# Patient Record
Sex: Male | Born: 1955 | Race: Asian | Hispanic: No | Marital: Married | Smoking: Never smoker
Health system: Southern US, Community
[De-identification: ages and names within clinical notes are randomized; demographics above are authoritative.]

## PROBLEM LIST (undated history)

## (undated) DIAGNOSIS — I10 Essential (primary) hypertension: Secondary | ICD-10-CM

## (undated) DIAGNOSIS — M109 Gout, unspecified: Secondary | ICD-10-CM

## (undated) HISTORY — DX: Essential (primary) hypertension: I10

## (undated) HISTORY — PX: PROSTATE BIOPSY: SHX241

## (undated) HISTORY — DX: Gout, unspecified: M10.9

## (undated) NOTE — *Deleted (*Deleted)
Rapid Response Event Note   Reason for Call :  MEWs  Initial Focused Assessment:  Called by the charge nurse to come and assess this patient.  His MEWs was elevated because his had elevated RR and was tachycardic.  Patient has developed sepsis during this hospital admission.  Patient was able to answer my questions with the help of an interpretor service.  He denied SOB and chest pain.  He was complaining of pain in RUQ.  Patient did not appear to be in any acute distress   BP 128/75 ECG 114 RR 35 O2 94     Interventions:  Vitals taken and spoke to the patient via interpreter  Plan of Care:  Patient will remain on the unit   Event Summary:   MD Notified: Hospitalist Call Time:  0803 Arrival Time: 0805 End Time:  Andrey Spearman, RN

## (undated) NOTE — *Deleted (*Deleted)
PMR Admission Coordinator Pre-Admission Assessment  Patient: Mark May is an 35 y.o., male MRN: [id number] DOB: [birth year] Height:   Weight:                Insurance Information HMO:     PPO: yes     PCP:      IPA:      80/20:      OTHER:  PRIMARY: [facility]      Policy#: [id number]      Subscriber: patient CM Name: ***      Phone#: ***     Fax#: *** Pre-Cert#: ***      Employer:  Benefits:  Phone #: [phone]     Name:  Eff. Date: 01/30/2020-10/30/2020     Deduct: $1,500 ($0 met)      Out of Pocket Max: $2,850 ($15 met)      Life Max: NA  CIR: 70% coverage, 30% co-insurance, after ded if met      SNF: 70% coverage, 30% co-insurance; limited to 60 days per cal yr, after ded is met Outpatient: 70% coverage, 30% co-insurance, 30 per calendar year combined therapies, after ded is met     Home Health: 70% coverage, 20% co-insurance, after ded is met   DME: 70% coverage, 30% co-insurance, after ded is met      Providers:  SECONDARY: None      Policy#:       Phone#:   Artist:       Phone#:   The Engineer, materials Information Summary" for patients in Inpatient Rehabilitation Facilities with attached "Privacy Act Statement-Health Care Records" was provided and verbally reviewed with: {CHL IP Patient Family EX:[id number]}  Emergency Contact Information Contact Information    Name Relation Home Work Mobile   Mark May,Jin Daughter [phone]  [phone]   Kamin, Niblack [phone]  [phone]     Current Medical History  Patient Admitting Diagnosis: 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  Per chart review lives with spouse and children 1 level home 7 steps to entry.  Reportedly independent prior to admission.  Presented 10/09/2020 with left-sided  weakness, dysarthria and facial droop.  Cranial CT scan negative for acute changes.  CT angiogram of head and neck severe distal right M1 stenosis versus subocclusive thrombus.  Right MCA infarct with extensive penumbra.  4 mm right supraclinoid ICA stenosis.  Admission chemistries unremarkable aside glucose 110 hemoglobin 16.5 hemoglobin A1c 5.8.  Patient underwent thrombectomy revascularization with stent placement per interventional radiology.  MRI/MRI showed fairly extensive patchy acute early subacute infarcts within the right MCA watershed territory.  Additional small acute early subacute infarcts within the dorsal right thalamus and left basal ganglia.  Echocardiogram with ejection fraction of 70 to 75% grade 2 diastolic dysfunction.  Venous Dopplers left upper extremity negative for DVT.  Maintained on aspirin as well as Brilinta for CVA prophylaxis.  Subcutaneous Lovenox for DVT prophylaxis.  Maintain on a dysphagia #3 nectar thick liquid diet.  Therapy evaluations completed and patient is to be admitted for a comprehensive rehab program on ***.  Complete NIHSS TOTAL: 16 Glasgow Coma Scale Score: 14  Past Medical History  Past Medical History:  Diagnosis Date  . Gout   . Hypertension     Family History  family history includes Diabetes in his father and  mother.  Prior Rehab/Hospitalizations:  Has the patient had prior rehab or hospitalizations prior to admission? Yes  Has the patient had major surgery during 100 days prior to admission? Yes  Current Medications   Current Facility-Administered Medications:  .   stroke: mapping our early stages of recovery book, , Does not apply, Once, Biby, Sharon L, NP .  0.9 %  sodium chloride infusion, , Intravenous, Continuous, Biby, Sharon L, NP, Last Rate: 75 mL/hr at 10/10/20 0153, New Bag at 10/10/20 0153 .  acetaminophen (TYLENOL) tablet 650 mg, 650 mg, Oral, Q4H PRN, 650 mg at 10/09/20 2351 **OR** acetaminophen (TYLENOL) 160 MG/5ML solution  650 mg, 650 mg, Per Tube, Q4H PRN, 650 mg at 10/08/20 0419 **OR** acetaminophen (TYLENOL) suppository 650 mg, 650 mg, Rectal, Q4H PRN, Layne Benton, NP, 650 mg at 10/07/20 2035 .  aspirin chewable tablet 81 mg, 81 mg, Oral, Daily, 81 mg at 10/10/20 0903 **OR** aspirin chewable tablet 81 mg, 81 mg, Per Tube, Daily, Biby, Sharon L, NP, 81 mg at 10/08/20 1042 .  atorvastatin (LIPITOR) tablet 20 mg, 20 mg, Oral, Daily, Biby, Sharon L, NP, 20 mg at 10/10/20 0903 .  chlorhexidine (PERIDEX) 0.12 % solution 15 mL, 15 mL, Mouth Rinse, BID, Biby, Sharon L, NP, 15 mL at 10/10/20 0903 .  Chlorhexidine Gluconate Cloth 2 % PADS 6 each, 6 each, Topical, Daily, Layne Benton, NP, 6 each at 10/10/20 0904 .  enoxaparin (LOVENOX) injection 40 mg, 40 mg, Subcutaneous, Q24H, Biby, Sharon L, NP, 40 mg at 10/09/20 1738 .  lisinopril (ZESTRIL) tablet 10 mg, 10 mg, Oral, Daily, Biby, Sharon L, NP, 10 mg at 10/10/20 0904 .  loperamide (IMODIUM) capsule 2 mg, 2 mg, Oral, TID, Micki Riley, MD, 2 mg at 10/10/20 0903 .  MEDLINE mouth rinse, 15 mL, Mouth Rinse, q12n4p, Biby, Sharon L, NP, 15 mL at 10/09/20 1640 .  melatonin tablet 3 mg, 3 mg, Oral, QHS, Erick Blinks, MD, 3 mg at 10/10/20 0119 .  [employer], , Oral, PRN, Layne Benton, NP .  senna-docusate (Senokot-S) tablet 1 tablet, 1 tablet, Oral, QHS PRN, Catha Gosselin, Sharon L, NP .  tamsulosin (FLOMAX) capsule 0.4 mg, 0.4 mg, Oral, QPC breakfast, Biby, Sharon L, NP, 0.4 mg at 10/10/20 0903 .  ticagrelor (BRILINTA) tablet 90 mg, 90 mg, Oral, BID, 90 mg at 10/10/20 0903 **OR** ticagrelor (BRILINTA) tablet 90 mg, 90 mg, Per Tube, BID, Biby, Sharon L, NP, 90 mg at 10/08/20 1042  Patients Current Diet:  Diet Order            DIET DYS 3 Room service appropriate? 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  Independent  Indoor Mobility: Did the patient need assistance with walking from room to room (with or without device)? Independent  Stairs: Did the patient need assistance with internal or external stairs (with or without device)? Independent  Functional  Cognition: Did the patient need help planning regular tasks such as shopping or remembering to take medications? Independent  Home Assistive Devices / Equipment Home Assistive Devices/Equipment: None Home Equipment: None  Prior Device Use: Indicate devices/aids used by the patient prior to current illness, exacerbation or injury? None of the above  Current Functional Level Cognition  Overall Cognitive Status: Impaired/Different from baseline Current Attention Level: Alternating Orientation Level: Oriented X4 Following Commands: Follows one step commands inconsistently, Follows one step commands with increased time Safety/Judgement: Decreased awareness of safety, Decreased awareness of deficits General Comments: Pt minimally responsive this date and lethargic. Increased time and max cues provided to facilitate mobility, with decreased initiation noted.    Extremity Assessment (includes Sensation/Coordination)  Upper Extremity Assessment: LUE deficits/detail LUE Deficits / Details: flaccid LUE Sensation: decreased light touch LUE Coordination: decreased fine motor, decreased gross motor  Lower Extremity Assessment: LLE deficits/detail LLE Deficits / Details: quad activation, needs strong visual attention and tactile input of R hand to engage  on command LLE Coordination: decreased fine motor, decreased gross motor    ADLs  Overall ADL's : Needs assistance/impaired Eating/Feeding: Minimal assistance, Bed level Eating/Feeding Details (indicate cue type and reason): eating banana that was on L side of tray. pt requesting to eat instead of cook food on tray  Grooming: Oral care, Maximal assistance Grooming Details (indicate cue type and reason): pt noted to have dry mouth and lips--- provided oral care and lip moisturer this session Lower Body Dressing: Total assistance Toilet Transfer: +2 for physical assistance, Maximal assistance (L LE fully blocked) Toilet Transfer Details (indicate cue type and reason): simulated  Toileting - Clothing Manipulation Details (indicate cue type and reason): pt incontinence x2 during session and no awareness. RN made aware of signs of iriration due to voiding. pt with heavy barrier cream applied during session to help protect sign General ADL Comments: Translator placed in R visual field with good communicaiton engagement from patient. Pt needed cues to scan to the L for objects     Mobility  Overal bed mobility: Needs Assistance Bed Mobility: Supine to Sit Rolling: Max assist (roll L side minA, but maxA to roll to R) Supine to sit: +2 for physical assistance, Max assist Sit to supine: Mod assist, +2 for physical assistance General bed mobility comments: Cues provided to manage LEs off EOB, with decreased initiation noted with B LEs with min movement on R and no activation noted on L. MaxAx2 to manage trunk and LEs.    Transfers  Overall transfer level: Needs assistance Equipment used: 2 person hand held assist Transfers: Stand Pivot Transfers Sit to Stand: +2 physical assistance, Max assist Stand pivot transfers: +2 physical assistance, Max assist General transfer comment: L knee block and cues to come to stand and pivot towards R EOB > bedside chair, maxAx2 with min initiation noted to maintain  balance.     Ambulation / Gait / Stairs / Wheelchair Mobility  Ambulation/Gait General Gait Details: unable to attempt today    Posture / Balance Dynamic Sitting Balance Sitting balance - Comments: MaxA at trunk to maintain static sitting balance EOB. Balance Overall balance assessment: Needs assistance Sitting-balance support: Bilateral upper extremity supported, Feet supported Sitting balance-Leahy Scale: Zero Sitting balance - Comments: MaxA at trunk to maintain static sitting balance EOB. Standing balance support: Single extremity supported Standing balance-Leahy Scale: Zero Standing balance comment: maxA of 2 to maintain static stand with L knee block     Special needs/care consideration {Special Care  Needs/Care Considerations:[id number]}     Previous Home Environment (from acute therapy documentation) Living Arrangements: Spouse/significant other, Children Available Help at Discharge: Family, Available 24 hours/day Type of Home: House Home Layout: One level Home Access: Stairs to enter Entrance Stairs-Rails: Can reach both Entrance Stairs-Number of Steps: 7-8 Home Care Services: No Additional Comments: information via PT evaluation. pt mentiond wife during OT eval. RN staff mentioned a son to translate for them  Discharge Living Setting Plans for Discharge Living Setting: Patient's home, House, Lives with (comment) (wife, son and grandchildren) Type of Home at Discharge: House Discharge Home Layout: Two level, Able to live on main level with bedroom/bathroom Alternate Level Stairs-Rails: None Alternate Level Stairs-Number of Steps: NA Discharge Home Access: Stairs to enter Entrance Stairs-Rails: Can reach both Entrance Stairs-Number of Steps: 7-8  Discharge Bathroom Shower/Tub: Walk-in shower Discharge Bathroom Toilet: Standard Discharge Bathroom Accessibility: Yes How Accessible: Accessible via walker Does the patient have any problems obtaining your medications?:  No  Social/Family/Support Systems Patient Roles: Spouse (works around the house) Solicitor Information: son Bevelyn Buckles): [phone] (speaks english) Anticipated Caregiver: son + wife Anticipated Caregiver's Contact Information: see above for son's info Ability/Limitations of Caregiver: Min A Caregiver Availability: 24/7 Discharge Plan Discussed with Primary Caregiver: Yes (with son) Is Caregiver In Agreement with Plan?: Yes Does Caregiver/Family have Issues with Lodging/Transportation while Pt is in Rehab?: No   Goals Patient/Family Goal for Rehab: PT/OT/SLP: supervision/Min A Expected length of stay: 24-28 days Additional Information: understands some English-speaks Mandarin Pt/Family Agrees to Admission and willing to participate: Yes Program Orientation Provided & Reviewed with Pt/Caregiver Including Roles  & Responsibilities: Yes (pt and his son)  Barriers to Discharge: Home environment access/layout  Barriers to Discharge Comments: steps to enter home   Decrease burden of Care through IP rehab admission: OtherNA   Possible need for SNF placement upon discharge:Not anticipated; pt has good family support and they have an understanding of his expected goal level at DC.    Patient Condition: {PATIENT'S CONDITION:22832}  Preadmission Screen Completed By:  Cheri Rous, OT, 10/10/2020 12:13 PM ______________________________________________________________________   Discussed status with Dr. Marland Kitchenon***at *** and received approval for admission today.  Admission Coordinator:  Cheri Rous, time***/Date***

## (undated) NOTE — *Deleted (*Deleted)
Physical Medicine and Rehabilitation Admission H&P     HPI: Mark May is a 12 year old right-handed non-English-speaking Asian male with history of gout hypertension BPH sciatica partial gastrectomy for PUD.  Per chart review lives with spouse and children 1 level home 7 steps to entry.  Reportedly independent prior to admission.  Presented 10/08/2020 with left-sided weakness, dysarthria and facial droop.  Cranial CT scan negative for acute changes.  CT angiogram of head and neck severe distal right M1 stenosis versus subocclusive thrombus.  Right MCA infarct with extensive penumbra.  4 mm right supraclinoid ICA stenosis.  Admission chemistries unremarkable aside glucose 110 hemoglobin 16.5 hemoglobin A1c 5.8.  Patient underwent thrombectomy revascularization with stent placement per interventional radiology.  MRI/MRI showed fairly extensive patchy acute early subacute infarcts within the right MCA watershed territory.  Additional small acute early subacute infarcts within the dorsal right thalamus and left basal ganglia.  Echocardiogram with ejection fraction of 70 to 75% grade 2 diastolic dysfunction.  Venous Dopplers left upper extremity negative for DVT.  Maintained on aspirin as well as Brilinta for CVA prophylaxis.  Subcutaneous Lovenox for DVT prophylaxis.  Maintain on a dysphagia #3 nectar thick liquid diet.  Therapy evaluations completed and patient was admitted for a comprehensive rehab program.  Review of Systems  Unable to perform ROS: Language   Past Medical History:  Diagnosis Date  . Gout   . Hypertension    Past Surgical History:  Procedure Laterality Date  . PARTIAL GASTRECTOMY  1990   done in [place] for PUD  . PROSTATE BIOPSY  ~ 2005   negative for cancer (done in [place])  . RADIOLOGY WITH ANESTHESIA N/A 10/13/2020   Procedure: IR WITH ANESTHESIA;  Surgeon: Radiologist, Medication, MD;  Location: [facility] OR;  Service: Radiology;  Laterality: N/A;   Family History  Problem  Relation Age of Onset  . Diabetes Mother   . Diabetes Father    Social History:  reports that he has never smoked. He has never used smokeless tobacco. He reports that he does not drink alcohol and does not use drugs. Allergies: No Known Allergies Medications Prior to Admission  Medication Sig Dispense Refill  . allopurinol (ZYLOPRIM) 100 MG tablet Take 3 tablets (300 mg total) by mouth daily. (Patient not taking: Reported on 10/09/2020) 90 tablet 3  . amLODipine (NORVASC) 10 MG tablet Take 1 tablet (10 mg total) by mouth daily. (Patient not taking: Reported on 10/09/2020) 30 tablet 5  . tamsulosin (FLOMAX) 0.4 MG CAPS capsule Take 1 capsule (0.4 mg total) by mouth daily after supper. (Patient not taking: Reported on 10/09/2020) 30 capsule 5    Drug Regimen Review Drug regimen was reviewed and remains appropriate with no significant issues identified  Home: Home Living Family/patient expects to be discharged to:: Private residence Living Arrangements: Spouse/significant other, Children Available Help at Discharge: Family, Available 24 hours/day Type of Home: House Home Access: Stairs to enter [employer] of Steps: 7-8 Entrance Stairs-Rails: Can reach both Home Layout: One level Home Equipment: None Additional Comments: information via PT evaluation. pt mentiond wife during OT eval. 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 MaxAx2 to manage trunk and LEs. Transfers Overall transfer level: Needs assistance Equipment used: 2 person hand held assist Transfers: Stand Pivot Transfers Sit to Stand: +2 physical assistance, Max assist Stand pivot transfers: +2 physical assistance, Max assist General transfer comment: L knee block and cues to come to stand and pivot towards R EOB > bedside chair, maxAx2 with min initiation noted to maintain balance.  Ambulation/Gait General Gait Details: unable to attempt today    ADL: ADL Overall ADL's : Needs assistance/impaired Eating/Feeding: Minimal assistance, Bed level Eating/Feeding Details (indicate cue type and reason): eating banana that was on L side of tray. pt requesting to eat instead of cook food on tray  Grooming: Oral care, Maximal assistance Grooming Details (indicate cue type and reason): pt noted to have dry mouth and lips--- provided oral care and lip moisturer this session Lower Body Dressing: Total assistance Toilet Transfer: +2 for physical assistance, Maximal assistance (L LE fully blocked) Toilet Transfer Details (indicate cue type and reason): simulated  Toileting - Clothing Manipulation Details (indicate cue type and reason): pt incontinence x2 during session and no awareness. RN made aware of signs of iriration due to voiding. pt with heavy barrier cream applied during session to help protect sign General ADL Comments: Translator placed in R visual field with good communicaiton engagement from patient. Pt needed cues to scan to the L for objects   Cognition: Cognition Overall Cognitive Status: Impaired/Different from baseline Orientation Level: Oriented X4 Cognition Arousal/Alertness: Lethargic Behavior During Therapy: Flat affect Overall Cognitive Status: Impaired/Different from baseline Area of Impairment: Attention, Memory, Following commands, Safety/judgement, Awareness, Problem solving Current  Attention Level: Alternating Memory: Decreased recall of precautions, Decreased short-term memory Following Commands: Follows one step commands inconsistently, Follows one step commands with increased time Safety/Judgement: Decreased awareness of safety, Decreased awareness of deficits Awareness: Intellectual Problem Solving: Slow processing, Decreased initiation, Difficulty sequencing, Requires verbal cues, Requires tactile cues General Comments: Pt minimally responsive this date and lethargic. Increased time and max cues provided to facilitate mobility, with decreased initiation noted.  Physical Exam: Blood pressure 126/64, pulse (!) 115, temperature 98.8 F (37.1 C), temperature source Oral, resp. rate (!) 24, SpO2 95 %. Physical Exam Neurological:     Comments: Patient is alert.  Appears to have some left inattention.  Non-English-speaking.  Speaks primarily Mandarin.  He does follow simple demonstrated commands.  Patient will use some hand gestures to indicate he is thirsty.     Results for orders placed or performed during the hospital encounter of 10/13/2020 (from the past 48 hour(s))  Troponin I (High Sensitivity)     Status: None   Collection Time: 10/09/20  7:55 PM  Result Value Ref Range   Troponin I (High Sensitivity) 8 <18 ng/L    Comment: (NOTE) Elevated high sensitivity troponin I (hsTnI) values and significant  changes across serial measurements may suggest ACS but many other  chronic and acute conditions are known to elevate hsTnI results.  Refer to the "Links" section for chest pain algorithms and additional  guidance. Performed at [facility], [address], [place], [place] [address]   Troponin I (High Sensitivity)     Status: None   Collection Time: 10/09/20 10:02 PM  Result Value  Ref Range   Troponin I (High Sensitivity) 6 <18 ng/L    Comment: (NOTE) Elevated high sensitivity troponin I (hsTnI) values and significant  changes across serial measurements  may suggest ACS but many other  chronic and acute conditions are known to elevate hsTnI results.  Refer to the "Links" section for chest pain algorithms and additional  guidance. Performed at [facility], [address], [place], [place] [address]   CBC     Status: None   Collection Time: 10/10/20  2:59 AM  Result Value Ref Range   WBC 5.1 4.0 - 10.5 K/uL   RBC 4.62 4.22 - 5.81 MIL/uL   Hemoglobin 13.8 13.0 - 17.0 g/dL   HCT 19.1 39 - 52 %   MCV 88.7 80.0 - 100.0 fL   MCH 29.9 26.0 - 34.0 pg   MCHC 33.7 30.0 - 36.0 g/dL   RDW 47.8 29.5 - 62.1 %   Platelets 203 150 - 400 K/uL   nRBC 0.0 0.0 - 0.2 %    Comment: Performed at [facility], [address] [place], [place] [address]  Basic metabolic panel     Status: Abnormal   Collection Time: 10/10/20  2:59 AM  Result Value Ref Range   Sodium 137 135 - 145 mmol/L   Potassium 3.4 (L) 3.5 - 5.1 mmol/L   Chloride 105 98 - 111 mmol/L   CO2 23 22 - 32 mmol/L   Glucose, Bld 165 (H) 70 - 99 mg/dL    Comment: Glucose reference range applies only to samples taken after fasting for at least 8 hours.   BUN 36 (H) 8 - 23 mg/dL   Creatinine, Ser 7.84 (H) 0.61 - 1.24 mg/dL   Calcium 8.1 (L) 8.9 - 10.3 mg/dL   GFR, Estimated 44 (L) >60 mL/min    Comment: (NOTE) Calculated using the CKD-EPI Creatinine Equation (2021)    Anion gap 9 5 - 15    Comment: Performed at [facility], [address] [place], [place] [address]  Glucose, capillary     Status: Abnormal   Collection Time: 10/10/20  2:54 PM  Result Value Ref Range   Glucose-Capillary 154 (H) 70 - 99 mg/dL    Comment: Glucose reference range applies only to samples taken after fasting for at least 8 hours.  Urinalysis, Routine w reflex microscopic Urine, Catheterized     Status: Abnormal   Collection Time: 10/10/20  5:14 PM  Result Value Ref Range   Color, Urine AMBER (A) YELLOW    Comment: BIOCHEMICALS MAY BE AFFECTED BY COLOR   APPearance CLOUDY (A) CLEAR    Specific Gravity, Urine 1.019 1.005 - 1.030   pH 5.0 5.0 - 8.0   Glucose, UA NEGATIVE NEGATIVE mg/dL   Hgb urine dipstick SMALL (A) NEGATIVE   Bilirubin Urine NEGATIVE NEGATIVE   Ketones, ur NEGATIVE NEGATIVE mg/dL   Protein, ur 528 (A) NEGATIVE mg/dL   Nitrite NEGATIVE NEGATIVE   Leukocytes,Ua NEGATIVE NEGATIVE   RBC / HPF 0-5 0 - 5 RBC/hpf   WBC, UA 0-5 0 - 5 WBC/hpf   Bacteria, UA RARE (A) NONE SEEN   Mucus PRESENT    Hyaline Casts, UA PRESENT    Granular Casts, UA PRESENT    Amorphous Crystal PRESENT     Comment: Performed at [facility], [address] [place], [place] [address]   DG Chest 1 View  Result Date: 10/09/2020 CLINICAL DATA:  Chest pain.  EXAM: CHEST  1 VIEW COMPARISON:  None. FINDINGS: The heart size and mediastinal contours are within normal limits. Both lungs are clear. The visualized skeletal structures are unremarkable. Aortic calcifications are noted. IMPRESSION: No active disease. Electronically Signed   By: Katherine Mantle M.D.   On: 10/09/2020 19:52   DG Abd Portable 1V  Result Date: 10/10/2020 CLINICAL DATA:  Abdominal distension EXAM: PORTABLE ABDOMEN - 1 VIEW COMPARISON:  10/07/2020 FINDINGS: Nasogastric tube is been removed. Contrast administered during a fluoroscopic examination of levin/8/21 is now seen throughout the colon and rectum which is nondilated. Normal abdominal gas pattern. No gross free intraperitoneal gas. Osseous structures are unremarkable. Previously noted renal calculi are obscured by intraluminal contrast. IMPRESSION: Normal abdominal gas pattern. Electronically Signed   By: Helyn Numbers MD   On: 10/10/2020 22:26       Medical Problem List and Plan: 1.  Left side weakness with dysarthria and facial droop secondary to right MCA infarction.  Status post IR TIC13 right M1 stent placement  -patient may *** shower  -ELOS/Goals: *** 2.  Antithrombotics: -DVT/anticoagulation: Lovenox  -antiplatelet therapy: Aspirin 81 mg  daily, Brilinta 90 mg twice daily 3. Pain Management: Tylenol as needed 4. Mood: Melatonin 3 mg nightly.  Provide emotional support  -antipsychotic agents: N/A 5. Neuropsych: This patient is capable of making decisions on his own behalf. 6. Skin/Wound Care: Routine skin checks 7. Fluids/Electrolytes/Nutrition: Routine in and outs with follow-up chemistries 8.  Dysphagia.  Dysphagia #3 nectar liquids.  Follow-up speech therapy 9.  Hypertension.  Lisinopril 10 mg daily.  Monitor with increased mobility 10.  Hyperlipidemia.  Lipitor 11.  BPH.  Flomax 0.4 mg daily.  Check PVR 12.  History of gout.  Monitor for any gout flareups  ***  Charlton Amor, PA-C 10/11/2020

## (undated) NOTE — *Deleted (*Deleted)
NAME:  Mark May, MRN:  [id number], DOB:  June 30, 1956, LOS: 17 ADMISSION DATE:  10/22/2020, CONSULTATION DATE: 10/09/2020 REFERRING MD: Dr. Curtis Sites, CHIEF COMPLAINT: Left-sided weakness  Brief History   89 year old male with hypertension and hyperlipidemia who presented with left-sided weakness, noted to have right MCA occlusion status post thrombectomy and stent placement 11/5.  Hospitalization complicated by hypoxic respiratory failure secondary to pulmonary edema requiring BiPAP and lasix.  Awaiting CIR placement however on the evening of 11/10, he was noted to become more lethargic with tachycardia and tachypnea.  Had complained of abdominal pain with some distention and chest pain which resolved after burping.  KUB obtained which was normal.  On 11/11, some what more responsive but now with increasing sCr in which lisinopril was stopped and development of fever 102.6  [facility] initially consulted for help with medical management however on their evaluation, PCCM consulted given ill appearance and developing hypotension and new onset Afib with RVR found to have sepsis with RLL pneumonia likely due to aspiration started on zosyn. PCCM consulted for continue right abdominal pain and distension with concern for bowel perforation secondary to Cortrak.  On 11/13, CT abd/ pelvis showed large right perinephric fluid collection and pockets of air concerning for an infectious process/abscess. Underwent CT guided drain placement of right retroperitoneal abscess.  On 11/15, he developed melena with anemia.  GI consulted with plans to medically management and deferred EGD.  Surgery was consulted on 11/17 with imaging concerning for possible contained bowel perforation.  On 11/20, he developed respiratory distress requiring intubation.   Past Medical History  Hypertension Peptic ulcer disease, previous partial gastrectomy (in [place]/ 1990) Gout BPH Sciatica   Significant Hospital Events   11/5 Mechanical  thrombectomy of right MCA and stent 11/5 11/11 PCCM reconsulted  11/12 Cortrak placed 11/17 PCCM reconsulted 11/18 - ccm recalled by rapid response, fever, resp distress, CXR new left infiltrates with left pleural effusion  Consults:  IR Neurology PCCM 11/5- 11/8; 11/11; 11/16-11/17, 11/18 -  [facility] Urology 11/13 GI 11/15 CCS 11/17  Procedures:  11/6 right femoral sheath was removed 11/14 CT guided drainage of right retroperitoneal abscess; JP >> 11/16 L NGT >> 11/17 RUE PICC >> 11/20 ETT >> 11/20 left thoracentesis > 650 ml of cloudy yellow fluid  Significant Diagnostic Tests:  11/5 CT head: No acute abnormality. Minimal diffuse cerebral and cerebellar atrophy. Mild chronic small vessel white matter ischemic changes in both cerebral hemispheres  11/5 CTA head and neck: Severe distal right M1 stenosis or subocclusive thrombus, Right MCA infarct with extensive penumbra, 4 mm right supraclinoid ICA aneurysm. Widely patent cervical carotid and vertebral arteries.  11/5 postprocedure CT head: Hypodensity in the right lateral temporal lobe now shows mild hemorrhage or contrast enhancement. This is most consistent with acute infarct. There has been interval stenting of the right middle cerebral artery.  11/6 MRI brain: 1. Fairly extensive patchy acute/early subacute infarcts within the right MCA/watershed territory.  2. Additional small acute/early subacute infarcts within the dorsal right thalamus and left basal ganglia. 3. Background moderate chronic small vessel ischemic disease.  11/6  MRA head: 1. Interval stenting of the M1/M2 right middle cerebral artery. Flow related signal is present proximal and distal to the stent suggestive of stent patency. 2. 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   11/6 Transthoracic Echocardiogram  1. Left ventricular ejection fraction, by estimation, is 70 to 75%. The left ventricle has hyperdynamic function.  The left  ventricle has no regional wall motion abnormalities. Left ventricular diastolic parameters are consistent with Grade II diastolic dysfunction (pseudonormalization). Elevated left atrial pressure.  2. Right ventricular systolic function is normal. The right ventricular size is normal.  3. The mitral valve is normal in structure. Mild mitral valve regurgitation. No evidence of mitral stenosis.  4. The aortic valve is normal in structure. Aortic valve regurgitation is not visualized. No aortic stenosis is present.  5. The inferior vena cava is dilated in size with <50% respiratory variability, suggesting right atrial pressure of 15 mmHg.  11/10 KUB >> normal gas pattern  11/13 CT abdomen and pelvis wo contrast 1. Large right perinephric fluid collection and pockets of air concerning for an infectious process/abscess. Clinical correlation is recommended. 2. Cholelithiasis. 3. Nonobstructing right renal calculi. No hydronephrosis. 4. Colonic diverticulosis. No bowel obstruction. 5. Partially visualized small bilateral pleural effusions with complete consolidative changes of the visualized lower lobes. 6. Aortic Atherosclerosis (ICD10-I70.0).  11/16 CT A/P IMPRESSION: Interval placement of drainage catheter into the right perinephric space in the area of previously seen gas and fluid collection. Fluid collection has decreased since prior study. There is now contrast material seen within the perinephric space and extending into the right paracolic gutter. This is concerning for possible fistulous communication to the colon. May consider contrast injection through the drainage catheter under fluoroscopic guidance to assess for enteric fistula. Moderate bilateral pleural effusions with bibasilar atelectasis or consolidation, unchanged.  Cholelithiasis.  NG tube in the stomach.  Aortic atherosclerosis.  Small to moderate free fluid in the pelvis.  11/21 CT abd/ pelvis >> 1. Very large  area of intra-abdominal fluid and blood within the left upper quadrant, with an appearance worrisome for active bleeding. 2. Large left pleural effusion with a small to moderate sized right pleural effusion. 3. Moderate severity bibasilar consolidation which may represent atelectasis and/or pneumonia. 4. Cholelithiasis. 5. Multiple stable bilateral renal cysts of various sizes. 6. Stable position of the right-sided percutaneous drainage catheter with a moderate amount of surrounding contained free air, fluid and inflammatory fat stranding. Fistulous communication with the adjacent portion of large bowel cannot be excluded. 7. Moderate to marked amount of para muscular subcutaneous inflammatory fat stranding along the lateral aspects of the left abdominal and pelvic walls. 8. Moderate to marked amount of para muscular. 9. Aortic atherosclerosis. Aortic Atherosclerosis   11/21 CT Chest W/ Contrast 1. There is a large left pleural effusion, which contains a circumscribed appearing region of heterogeneous high density inferiorly measuring approximately 15.3 x 12.1 x 8.5 cm. This is most consistent with a large extrapleural hematoma and there appears to be a small focus of contrast extravasation adjacent to the inferior margin of the posterolateral left tenth rib, most consistent with an intercostal artery extravasation following thoracentesis. This is not significantly changed compared to prior same day noncontrast examination. 2. Moderate right pleural effusion. 3. Redemonstrated percutaneous pigtail drainage catheter positioned in a right retroperitoneal air and fluid collection, not significantly changed compared to prior examination. 4. Small volume fluid attenuation ascites in the low pelvis. 5. Anasarca.  Micro Data:  11/5 MRSA PCR >> negative 11/5 Covid >> negative 11/5 Flu >> negative 11/10 UC >> neg 11/11 BCx2 >> neg 11/13 BC x2 >> neg 11/14 right RRP abscess >> klebsiella   11/19 BCx 2 >> 11/20 Left pleural fluid >> 11/21 Gram stain >>   Antimicrobials:  11/5 cefazolin - 11/6 11/11 Flagyl - 11/11 11/11 cefepime - 11/11; 11/18 >> 11/19  11/12 Vancomycin > 11/16; 11/18 11/17  Unasyn /11/18 11/18 flagyl >>11/19 11/17 diflucan >> 11/11 Zosyn >11/17; 11/19 >>   Interim history/subjective:   Plan for VATS procedure today with Dr. Cliffton Asters Patient intubated and sedate Fentanyl 250 and 60 of Neo PRVC 520 X 15 +5 30% 100.6; WBC 20.1 Glucose range 95-138 UO 730 (24 hours); total I/O +4,780.5   Objective   Blood pressure (!) 89/62, pulse 79, temperature 98.3 F (36.8 C), temperature source Axillary, resp. rate 15, weight 59.3 kg, SpO2 96 %. CVP:  [12 mmHg-16 mmHg] 12 mmHg  Vent Mode: PRVC FiO2 (%):  [30 %-40 %] 30 % Set Rate:  [15 bmp] 15 bmp Vt Set:  [520 mL] 520 mL PEEP:  [5 cmH20] 5 cmH20 Plateau Pressure:  [22 cmH20-27 cmH20] 22 cmH20   Intake/Output Summary (Last 24 hours) at 10/02/2020 1050 Last data filed at 10/09/2020 1000 Gross per 24 hour  Intake 4365.69 ml  Output 1145 ml  Net 3220.69 ml   Filed Weights   10/20/20 0500 10/21/20 0424 10/21/2020 0100  Weight: 56.1 kg 56.1 kg 59.3 kg   Physical Exam General: Thin, ill appearing older male in NAD HEENT: MM pink/moist, ETT, left NGT- dark bilious output Neuro: Sedated CV: NSR, no murmur PULM:  MV supported breaths, clear on right, diminished left  GI: hypoBS, NT, condom cath, JP drain- tan/ purulent Extremities: warm/dry, no LE edema  Skin: no rashes   CXR 11/21 reviewed, stable ETT, persistent mod to large left effusion, right basilar opacity vs atelectasis  UOP stable  JP drain 125 ml/ 24hrs Minimal dark bilious output from NGT  Assessment & Plan:   Acute respiratory failure secondary to aspiration pneumonia with bilateral pleural effusion (L>R), left empyema  - Plan for VATS procedure today with Dr. Cliffton Asters - Continue mechanical ventilation and wean as tolerated; no  plan for extubation today - PAD protocol with fentanyl gtt for RASS goal 0/-1 with daily WUA - Continue Zosyn  Shock- multifactorial, ABLA and septic  Leukocytosis Anemia Elevated LFTs -Continue Neo for MAP goal >65; Consider switching to Levo -Continue Zosyn and Diflucan -Transfuse for Hgb < 7 -Follow culture data - blood/ pleural studies  -Follow CBC  Sepsis due to right lower lobe pneumonia likely aspiration and intraabdominal abscess (klebsiella and candida) in setting of bowel perf and now with left empyema  Intra-abdominal abscess, contained bowel perforation- superior-most edge lines up with distal end of cortrak - Continue Zosyn and Diflucan - Continue bowel rest today; NGT to LIWS -Continue TPN per surgery  - Surgery following, appreciate input -Continue to trend CBC  AKI -Continue IV fluids -Monitor UO -Trend Bmet  Paroxysmal atrial fibrillation with rapid ventricular response - improved - remains in NSR - holding heparin given ABLA  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 Continue TPN Pain/Anxiety/Delirium protocol (if indicated):  Fentanyl gtt  VAP protocol (if indicated): HOB elevated. Oral care protocol DVT prophylaxis: SCDs for now GI prophylaxis: PPI BID Glucose control: SSI Mobility: bed rest Code Status: full Family Communication: Patient's daughter updated at bedside Disposition:  ICU  LABS    PULMONARY Recent Labs  Lab 10/16/20 0012 10/18/20 2208  10/20/20 0359  PHART 7.495* 7.532* 7.302*  PCO2ART 26.7* 22.4* 42.6  PO2ART 76.5* 56.7* 76*  HCO3 20.4 19.0* 21.1  TCO2  --   --  22  O2SAT 95.1 92.3  94.0    CBC Recent Labs  Lab 10/21/20 2100 10/30/2020 0232 2020/10/30 0446  HGB 9.4* 9.5* 8.8*  HCT 28.6* 29.0* 26.9*  WBC 17.5* 19.6* 18.5*  PLT 175 186 166    COAGULATION Recent Labs  Lab 10/21/20 0139 10-30-2020 0446  INR 1.7* 1.3*    CARDIAC  No results for input(s): TROPONINI in the last 168 hours. No results for input(s): PROBNP in the last 168 hours.   CHEMISTRY Recent Labs  Lab 10/17/20 0920 10/17/20 0920 10/18/20 0726 10/18/20 0726 10/19/20 0032 10/19/20 0032 10/20/20 0359 10/20/20 0359 10/20/20 1610 10/20/20 0632 10/21/20 1737 10-30-2020 0446  NA 147*   < > 148*   < > 148*  --  149*  --  146*  --  144 144  K 3.1*   < > 3.6   < > 3.6   < > 3.9   < > 4.0   < > 5.6* 4.7  CL 119*   < > 121*  --  121*  --   --   --  119*  --  118* 120*  CO2 19*   < > 19*  --  20*  --   --   --  19*  --  17* 16*  GLUCOSE 158*   < > 210*  --  130*  --   --   --  341*  --  209* 116*  BUN 33*   < > 27*  --  26*  --   --   --  35*  --  66* 61*  CREATININE 1.10   < > 1.07  --  1.03  --   --   --  1.20  --  2.62* 2.74*  CALCIUM 7.4*   < > 7.3*  --  7.4*  --   --   --  7.5*  --  7.3* 6.8*  MG 2.2  --  2.2  --  2.2  --   --   --  2.1  --   --  2.0  PHOS 4.0  --  2.7  --  3.0  --   --   --  3.7  --   --  4.9*   < > = values in this interval not displayed.   Estimated Creatinine Clearance: 22.5 mL/min (A) (by C-G formula based on SCr of 2.74 mg/dL (H)).   LIVER Recent Labs  Lab 10/18/20 0726 10/21/20 0139 10/21/20 0627 10/21/20 1737 2020-10-30 0446  AST 38  --   --  144* 170*  ALT 44  --   --  80* 99*  ALKPHOS 142*  --   --  70 67  BILITOT 1.1  --   --  1.6* 1.7*  PROT 5.1*  --  3.7* 5.0* 4.4*  ALBUMIN 1.3*  --   --  1.5* 1.3*  INR  --  1.7*  --   --  1.3*     INFECTIOUS Recent Labs  Lab 10/18/20 2258 10/19/20 0032 10/20/20 0632  LATICACIDVEN 1.6 1.5  --   PROCALCITON  --  1.38 1.96     ENDOCRINE CBG (last 3)  Recent Labs    10/21/20 2323 10/30/20 0336  10-30-2020 0836  GLUCAP 95 107* 102*     IMAGING x48h  -  image(s) personally visualized  -   highlighted in bold CT ABDOMEN PELVIS WO CONTRAST  Result Date: 10/21/2020 CLINICAL DATA:  Abdominal pain and abdominal distension. EXAM: CT ABDOMEN AND PELVIS WITHOUT CONTRAST TECHNIQUE: Multidetector CT imaging of the abdomen and pelvis was performed following the standard protocol without IV contrast. COMPARISON:  October 16, 2020 FINDINGS: Lower chest: Moderate severity areas of consolidation are seen within the bilateral lung bases. There is a large left pleural effusion with suspected loculated components. This is increased in size when compared to the prior study. A small to moderate sized right pleural effusion is also noted. Hepatobiliary: No focal liver abnormality is seen. Numerous subcentimeter gallstones are seen within the lumen of a normal appearing gallbladder. There is no evidence of biliary dilatation. Pancreas: Unremarkable. No pancreatic ductal dilatation or surrounding inflammatory changes. Spleen: The spleen is displaced within the anteromedial aspect of the left upper quadrant the. A 15.5 cm x 11.3 cm x 16.9 cm area containing a mixture of hemorrhagic and non hemorrhagic fluid is seen within the left upper quadrant. This appears to be retroperitoneal in location and represents a new finding when compared to the prior exam. Adrenals/Urinary Tract: Adrenal glands are unremarkable. Kidneys are normal in size. Multiple stable bilateral renal cysts of various sizes are seen. Multiple 2 mm, 3 mm and 4 mm nonobstructing renal stones are seen throughout the right kidney. A mild amount of air is again seen within the lumen of the urinary bladder. Stomach/Bowel: A nasogastric tube is seen with its distal tip noted within the body of the stomach. The appendix is not clearly identified. No evidence of bowel dilatation. A 2.1 cm x 1.3 cm area of oral contrast is seen adjacent to the posteromedial aspect of  the cecum (axial CT image 69, CT series number 3). This is present on the prior study. Vascular/Lymphatic: There is moderate severity calcification of the abdominal aorta and bilateral common iliac arteries, without evidence of aneurysmal dilatation. No enlarged abdominal or pelvic lymph nodes. Reproductive: Prostate is unremarkable. Other: A percutaneous drainage catheter is again seen entering via the right flank. Its distal tip is noted along the posterior aspect of the right lower quadrant and is unchanged in position when compared to the prior study. Numerous foci of moderate severity contained free air are again seen within this region. Persistent surrounding mesenteric inflammatory fat stranding and mesenteric fluid is also noted. A small amount of posterior pelvic free fluid is seen. This is stable in appearance when compared to the prior study. Musculoskeletal: A moderate to marked amount of para muscular subcutaneous inflammatory fat stranding is seen along the lateral aspects of the left abdominal and pelvic walls. No acute or significant osseous findings. IMPRESSION: 1. Very large area of intra-abdominal fluid and blood within the left upper quadrant, with an appearance worrisome for active bleeding. 2. Large left pleural effusion with a small to moderate sized right pleural effusion. 3. Moderate severity bibasilar consolidation which may represent atelectasis and/or pneumonia. 4. Cholelithiasis. 5. Multiple stable bilateral renal cysts of various sizes. 6. Stable position of the right-sided percutaneous drainage catheter with a moderate amount of surrounding contained free air, fluid and inflammatory fat stranding. Fistulous communication with the adjacent portion of large bowel cannot be excluded. 7. Moderate to marked amount of para muscular subcutaneous inflammatory fat stranding along the lateral aspects of the left abdominal and pelvic walls. 8. Moderate to marked amount of para muscular. 9. Aortic  atherosclerosis. Aortic Atherosclerosis (ICD10-I70.0). Electronically Signed  By: Aram Candela M.D.   On: 10/21/2020 01:53   CT CHEST W CONTRAST  Result Date: 10/21/2020 CLINICAL DATA:  Dropping hematocrit, large hemothorax following thoracentesis yesterday EXAM: CT CHEST, ABDOMEN, AND PELVIS WITH CONTRAST TECHNIQUE: Multidetector CT imaging of the chest, abdomen and pelvis was performed following the standard protocol during bolus administration of intravenous contrast. CONTRAST:  OMNIPAQUE IOHEXOL 300 MG/ML  SOLN COMPARISON:  CT abdomen pelvis, 10/21/2020, 1:09 a.m. FINDINGS: CT CHEST FINDINGS Cardiovascular: Aortic atherosclerosis. Right upper extremity PICC, tip near the superior cavoatrial junction. Normal heart size. No pericardial effusion. Mediastinum/Nodes: No enlarged mediastinal, hilar, or axillary lymph nodes. Thyroid is normal. Endotracheal and esophagogastric intubation. Lungs/Pleura: Large left, moderate right pleural effusions and associated atelectasis or consolidation. There is extensive, heterogeneous high density in the inferior aspect of the left pleural effusion, consistent with a large hematoma, given relatively circumscribed appearing appears to be extrapleural and measures approximately 15.3 x 12.1 x 8.5 cm (series 6, image 41). There appears to be a small focus of contrast extravasation adjacent to the inferior margin of the posterolateral left tenth rib (series 6, image 64). Musculoskeletal: No chest wall mass or suspicious bone lesions identified. CT ABDOMEN PELVIS FINDINGS Hepatobiliary: No solid liver abnormality is seen. Gallstones and sludge in the gallbladder. No gallbladder wall thickening, or biliary dilatation. Pancreas: Unremarkable. No pancreatic ductal dilatation or surrounding inflammatory changes. Spleen: Normal in size without significant abnormality. Adrenals/Urinary Tract: Adrenal glands are unremarkable. Multiple nonobstructive right renal calculi. No  left-sided calculi, ureteral calculi, or hydronephrosis. Small air-fluid level within the urinary bladder, similar to prior. Stomach/Bowel: Stomach is within normal limits. Appendix is not clearly visualized. No evidence of bowel wall thickening, distention, or inflammatory changes. Vascular/Lymphatic: Aortic atherosclerosis. No enlarged abdominal or pelvic lymph nodes. Reproductive: No mass or other abnormality. Other: Anasarca. Redemonstrated percutaneous pigtail drainage catheter positioned in a right retroperitoneal air and fluid collection, not significantly changed compared to prior examination (series 3, image 79). Small volume fluid attenuation ascites in the low pelvis (series 3, image 105). Musculoskeletal: No acute or significant osseous findings. IMPRESSION: 1. There is a large left pleural effusion, which contains a circumscribed appearing region of heterogeneous high density inferiorly measuring approximately 15.3 x 12.1 x 8.5 cm. This is most consistent with a large extrapleural hematoma and there appears to be a small focus of contrast extravasation adjacent to the inferior margin of the posterolateral left tenth rib, most consistent with an intercostal artery extravasation following thoracentesis. This is not significantly changed compared to prior same day noncontrast examination. 2. Moderate right pleural effusion. 3. Redemonstrated percutaneous pigtail drainage catheter positioned in a right retroperitoneal air and fluid collection, not significantly changed compared to prior examination. 4. Small volume fluid attenuation ascites in the low pelvis. 5. Anasarca. 6. Other chronic and incidental findings as above. Aortic Atherosclerosis (ICD10-I70.0). Findings discussed by telephone with Dr. Barton Fanny at 1:15 p.m., 10/21/2020. Electronically Signed   By: Lauralyn Primes M.D.   On: 10/21/2020 14:01   CT ABDOMEN PELVIS W CONTRAST  Result Date: 10/21/2020 CLINICAL DATA:  Dropping hematocrit, large  hemothorax following thoracentesis yesterday EXAM: CT CHEST, ABDOMEN, AND PELVIS WITH CONTRAST TECHNIQUE: Multidetector CT imaging of the chest, abdomen and pelvis was performed following the standard protocol during bolus administration of intravenous contrast. CONTRAST:  OMNIPAQUE IOHEXOL 300 MG/ML  SOLN COMPARISON:  CT abdomen pelvis, 10/21/2020, 1:09 a.m. FINDINGS: CT CHEST FINDINGS Cardiovascular: Aortic atherosclerosis. Right upper extremity PICC, tip near the superior cavoatrial junction. Normal  heart size. No pericardial effusion. Mediastinum/Nodes: No enlarged mediastinal, hilar, or axillary lymph nodes. Thyroid is normal. Endotracheal and esophagogastric intubation. Lungs/Pleura: Large left, moderate right pleural effusions and associated atelectasis or consolidation. There is extensive, heterogeneous high density in the inferior aspect of the left pleural effusion, consistent with a large hematoma, given relatively circumscribed appearing appears to be extrapleural and measures approximately 15.3 x 12.1 x 8.5 cm (series 6, image 41). There appears to be a small focus of contrast extravasation adjacent to the inferior margin of the posterolateral left tenth rib (series 6, image 64). Musculoskeletal: No chest wall mass or suspicious bone lesions identified. CT ABDOMEN PELVIS FINDINGS Hepatobiliary: No solid liver abnormality is seen. Gallstones and sludge in the gallbladder. No gallbladder wall thickening, or biliary dilatation. Pancreas: Unremarkable. No pancreatic ductal dilatation or surrounding inflammatory changes. Spleen: Normal in size without significant abnormality. Adrenals/Urinary Tract: Adrenal glands are unremarkable. Multiple nonobstructive right renal calculi. No left-sided calculi, ureteral calculi, or hydronephrosis. Small air-fluid level within the urinary bladder, similar to prior. Stomach/Bowel: Stomach is within normal limits. Appendix is not clearly visualized. No evidence of  bowel wall thickening, distention, or inflammatory changes. Vascular/Lymphatic: Aortic atherosclerosis. No enlarged abdominal or pelvic lymph nodes. Reproductive: No mass or other abnormality. Other: Anasarca. Redemonstrated percutaneous pigtail drainage catheter positioned in a right retroperitoneal air and fluid collection, not significantly changed compared to prior examination (series 3, image 79). Small volume fluid attenuation ascites in the low pelvis (series 3, image 105). Musculoskeletal: No acute or significant osseous findings. IMPRESSION: 1. There is a large left pleural effusion, which contains a circumscribed appearing region of heterogeneous high density inferiorly measuring approximately 15.3 x 12.1 x 8.5 cm. This is most consistent with a large extrapleural hematoma and there appears to be a small focus of contrast extravasation adjacent to the inferior margin of the posterolateral left tenth rib, most consistent with an intercostal artery extravasation following thoracentesis. This is not significantly changed compared to prior same day noncontrast examination. 2. Moderate right pleural effusion. 3. Redemonstrated percutaneous pigtail drainage catheter positioned in a right retroperitoneal air and fluid collection, not significantly changed compared to prior examination. 4. Small volume fluid attenuation ascites in the low pelvis. 5. Anasarca. 6. Other chronic and incidental findings as above. Aortic Atherosclerosis (ICD10-I70.0). Findings discussed by telephone with Dr. Barton Fanny at 1:15 p.m., 10/21/2020. Electronically Signed   By: Lauralyn Primes M.D.   On: 10/21/2020 14:01   DG Chest Port 1 View  Result Date: 10/21/2020 CLINICAL DATA:  Respiratory failure EXAM: PORTABLE CHEST 1 VIEW COMPARISON:  None. FINDINGS: AE moderate to large left-sided pleural effusion with underlying opacity is stable. The ETT is stable in good position. The OG tube terminates below today's film. No pneumothorax. No  change in the cardiomediastinal silhouette. The right mid lung opacity seen previously has resolved. Mild opacity in the right base is probably atelectasis. The right PICC line terminates in the right atrium, unchanged. No other acute abnormalities. IMPRESSION: 1. Support apparatus as above. 2. Persistent moderate to large left effusion. 3. Resolution of right midlung opacity. 4. Mild opacity in the right base is probably atelectasis. Electronically Signed   By: Gerome Sam III M.D   On: 10/21/2020 09:37   DG Chest Port 1 View  Result Date: 10/20/2020 CLINICAL DATA:  Post thoracentesis EXAM: PORTABLE CHEST 1 VIEW COMPARISON:  Radiograph 10/20/2020 FINDINGS: Endotracheal tube tip terminates 2.5 cm from the carina. Transesophageal tube tip and side port distal to the GE junction.  Right upper extremity PICC terminates at the right atrium. Telemetry leads overlie the chest. Slight interval decrease in the size of a left pleural effusion with some lobular margins likely reflecting loculation. More coalescent retrocardiac opacity could reflect a combination of layering pleural fluid, atelectasis and/or airspace disease. Increasing coalescence of opacity seen in the right mid and lower lung with a trace right effusion as well. No visible pneumothorax. The aorta is calcified. The remaining cardiomediastinal contours are unremarkable. No acute osseous or soft tissue abnormality. Degenerative changes are present in the imaged spine and shoulders. IMPRESSION: 1. Slight interval decrease in size of a left pleural effusion with some lobular margins which could reflect loculation. 2. Slightly increased coalescent retrocardiac and right mid and lower lung opacities, could reflect a combination of layering pleural fluid, atelectasis and/or airspace disease. 3. No pneumothorax. 4. Lines and tubes as above. 5.  Aortic Atherosclerosis (ICD10-I70.0). Electronically Signed   By: Kreg Shropshire M.D.   On: 10/20/2020 19:50      CCT: *** mins    10/27/20, 10:50 AM  See Amion for personal pager PCCM on call pager [phone]

---

## 1988-12-01 HISTORY — PX: PARTIAL GASTRECTOMY: SHX6003

## 2007-04-26 ENCOUNTER — Emergency Department: Payer: Self-pay | Admitting: Internal Medicine

## 2007-04-28 ENCOUNTER — Ambulatory Visit: Payer: Self-pay | Admitting: Family Medicine

## 2010-11-08 ENCOUNTER — Emergency Department: Payer: Self-pay | Admitting: Emergency Medicine

## 2011-03-04 ENCOUNTER — Ambulatory Visit: Payer: Self-pay | Admitting: Internal Medicine

## 2013-12-02 LAB — HM COLONOSCOPY: HM Colonoscopy: NORMAL

## 2014-04-20 ENCOUNTER — Ambulatory Visit: Payer: Self-pay | Admitting: Gastroenterology

## 2015-01-06 ENCOUNTER — Ambulatory Visit: Payer: Self-pay | Admitting: Internal Medicine

## 2015-01-06 LAB — CBC WITH DIFFERENTIAL/PLATELET
Basophil #: 0.1 10*3/uL (ref 0.0–0.1)
Basophil %: 0.6 %
Eosinophil #: 0 10*3/uL (ref 0.0–0.7)
Eosinophil %: 0.1 %
HCT: 39.5 % — ABNORMAL LOW (ref 40.0–52.0)
HGB: 12.6 g/dL — ABNORMAL LOW (ref 13.0–18.0)
Lymphocyte #: 0.9 10*3/uL — ABNORMAL LOW (ref 1.0–3.6)
Lymphocyte %: 6.4 %
MCH: 25.3 pg — ABNORMAL LOW (ref 26.0–34.0)
MCHC: 31.8 g/dL — ABNORMAL LOW (ref 32.0–36.0)
MCV: 80 fL (ref 80–100)
Monocyte #: 0.5 x10 3/mm (ref 0.2–1.0)
Monocyte %: 3.1 %
Neutrophil #: 13 10*3/uL — ABNORMAL HIGH (ref 1.4–6.5)
Neutrophil %: 89.8 %
Platelet: 308 10*3/uL (ref 150–440)
RBC: 4.96 10*6/uL (ref 4.40–5.90)
RDW: 17.1 % — ABNORMAL HIGH (ref 11.5–14.5)
WBC: 14.4 10*3/uL — ABNORMAL HIGH (ref 3.8–10.6)

## 2015-01-06 LAB — COMPREHENSIVE METABOLIC PANEL
Albumin: 4.2 g/dL (ref 3.4–5.0)
Alkaline Phosphatase: 123 U/L — ABNORMAL HIGH (ref 46–116)
Anion Gap: 9 (ref 7–16)
BUN: 12 mg/dL (ref 7–18)
Bilirubin,Total: 0.3 mg/dL (ref 0.2–1.0)
Calcium, Total: 8.7 mg/dL (ref 8.5–10.1)
Chloride: 99 mmol/L (ref 98–107)
Co2: 28 mmol/L (ref 21–32)
Creatinine: 0.88 mg/dL (ref 0.60–1.30)
EGFR (African American): >60
EGFR (Non-African Amer.): >60
Glucose: 154 mg/dL — ABNORMAL HIGH (ref 65–99)
Osmolality: 275 (ref 275–301)
Potassium: 3.6 mmol/L (ref 3.5–5.1)
SGOT(AST): 32 U/L (ref 15–37)
SGPT (ALT): 44 U/L (ref 14–63)
Sodium: 136 mmol/L (ref 136–145)
Total Protein: 8 g/dL (ref 6.4–8.2)

## 2015-01-06 LAB — URINALYSIS, COMPLETE
Bacteria: NEGATIVE
Bilirubin,UR: NEGATIVE
Blood: NEGATIVE
Glucose,UR: NEGATIVE
Ketone: NEGATIVE
Leukocyte Esterase: NEGATIVE
Nitrite: NEGATIVE
Ph: 7 (ref 5.0–8.0)
Specific Gravity: 1.02 (ref 1.000–1.030)
Squamous Epithelial: NONE SEEN
WBC UR: NONE SEEN /HPF (ref 0–5)

## 2015-01-08 LAB — URINE CULTURE

## 2015-06-26 ENCOUNTER — Other Ambulatory Visit: Payer: Self-pay | Admitting: Internal Medicine

## 2015-06-26 ENCOUNTER — Encounter: Payer: Self-pay | Admitting: Internal Medicine

## 2015-06-26 DIAGNOSIS — M109 Gout, unspecified: Secondary | ICD-10-CM | POA: Insufficient documentation

## 2015-06-26 DIAGNOSIS — I10 Essential (primary) hypertension: Secondary | ICD-10-CM | POA: Insufficient documentation

## 2015-08-10 ENCOUNTER — Ambulatory Visit: Payer: Self-pay | Admitting: Internal Medicine

## 2015-09-05 ENCOUNTER — Ambulatory Visit: Payer: Self-pay | Admitting: Internal Medicine

## 2015-09-11 ENCOUNTER — Ambulatory Visit: Payer: Self-pay | Admitting: Internal Medicine

## 2015-09-11 ENCOUNTER — Encounter: Payer: Self-pay | Admitting: Internal Medicine

## 2015-09-24 ENCOUNTER — Ambulatory Visit (INDEPENDENT_AMBULATORY_CARE_PROVIDER_SITE_OTHER): Payer: No Typology Code available for payment source | Admitting: Internal Medicine

## 2015-09-24 ENCOUNTER — Encounter: Payer: Self-pay | Admitting: Internal Medicine

## 2015-09-24 VITALS — BP 130/84 | HR 72 | Ht 66.0 in | Wt 119.4 lb

## 2015-09-24 DIAGNOSIS — M109 Gout, unspecified: Secondary | ICD-10-CM | POA: Diagnosis not present

## 2015-09-24 DIAGNOSIS — I10 Essential (primary) hypertension: Secondary | ICD-10-CM

## 2015-09-24 DIAGNOSIS — Z23 Encounter for immunization: Secondary | ICD-10-CM

## 2015-09-24 DIAGNOSIS — S8002XA Contusion of left knee, initial encounter: Secondary | ICD-10-CM

## 2015-09-24 DIAGNOSIS — S60221A Contusion of right hand, initial encounter: Secondary | ICD-10-CM | POA: Diagnosis not present

## 2015-09-24 MED ORDER — AMLODIPINE BESYLATE 10 MG PO TABS: 10.0000 mg | ORAL_TABLET | Freq: Every day | ORAL | Status: DC

## 2015-09-24 MED ORDER — ALLOPURINOL 300 MG PO TABS
300.0000 mg | ORAL_TABLET | Freq: Every day | ORAL | Status: DC
Start: 2015-09-24 — End: 2016-05-15

## 2015-09-24 NOTE — Progress Notes (Signed)
Date:  09/24/2015   Name:  Mark May   DOB:  05/27/1956   MRN:  [id number]   Chief Complaint: Knee Pain and Hypertension Knee Pain  The incident occurred more than 1 week ago. Incident location: in a restaurant. The injury mechanism was a fall. The pain is present in the left knee (right hand/thumb and right face). The pain is mild. The pain has been improving since onset. Pertinent negatives include no inability to bear weight, loss of motion, loss of sensation, muscle weakness, numbness or tingling. The symptoms are aggravated by weight bearing. He has tried acetaminophen for the symptoms. The treatment provided moderate relief.  Hypertension This is a chronic problem. The current episode started more than 1 year ago. The problem is unchanged. The problem is controlled. Pertinent negatives include no chest pain, headaches or shortness of breath. There are no associated agents to hypertension. There are no known risk factors for coronary artery disease. Past treatments include calcium channel blockers. The current treatment provides significant improvement.    Review of Systems  Constitutional: Negative for fatigue.  Respiratory: Negative for cough and shortness of breath.   Cardiovascular: Negative for chest pain and leg swelling.  Musculoskeletal: Positive for arthralgias. Negative for joint swelling and gait problem.  Skin: Positive for color change (bruising over upper knee).  Neurological: Negative for tingling, numbness and headaches.  Hematological: Negative for adenopathy. Does not bruise/bleed easily.  Psychiatric/Behavioral: Positive for dysphoric mood.    Patient Active Problem List   Diagnosis Date Noted  . Controlled gout 06/26/2015  . Benign hypertension 06/26/2015    Prior to Admission medications   Medication Sig Start Date End Date Taking? Authorizing Provider  allopurinol (ZYLOPRIM) 300 MG tablet Take 1 tablet by mouth daily.   Yes Historical Provider, MD  amLODipine  (NORVASC) 10 MG tablet Take 1 tablet by mouth daily. 02/07/15  Yes Historical Provider, MD  Colchicine 0.6 MG CAPS Take 1 capsule by mouth 2 (two) times daily as needed. 03/25/15  Yes Historical Provider, MD    No Known Allergies  No past surgical history on file.  Social History  Substance Use Topics  . Smoking status: Never Smoker   . Smokeless tobacco: None  . Alcohol Use: No     Medication list has been reviewed and updated.   Physical Exam  Constitutional: He is oriented to person, place, and time. He appears well-developed and well-nourished.  Neck: Neck supple.  Cardiovascular: Normal rate, regular rhythm and normal heart sounds.   Pulmonary/Chest: Effort normal and breath sounds normal. No respiratory distress. He has no wheezes.  Musculoskeletal:       Right wrist: He exhibits bony tenderness. He exhibits normal range of motion, no swelling, no effusion, no crepitus and no deformity.       Left knee: He exhibits ecchymosis. He exhibits normal range of motion, no swelling and no effusion. No tenderness found. No medial joint line and no lateral joint line tenderness noted.  Neurological: He is alert and oriented to person, place, and time. He has normal strength and normal reflexes. No sensory deficit. He displays a negative Romberg sign. Gait normal.  Skin: Skin is warm and dry.     Nursing note and vitals reviewed.   BP 130/84 mmHg  Pulse 72  Ht 5\' 6"  (1.676 m)  Wt 119 lb 6.4 oz (54.159 kg)  BMI 19.28 kg/m2  Assessment and Plan: 1. Benign hypertension Controlled on current regimen - amLODipine (NORVASC) 10 MG  tablet; Take 1 tablet (10 mg total) by mouth daily.  Dispense: 30 tablet; Refill: 5  2. Contusion, knee, left, initial encounter Mild bruising noted without other findings Patient reassured it should resolve over the next several weeks Tylenol as needed  3. Contusion, hand, right, initial encounter Improving - as in #2 above  4. Controlled  gout Continue medication - allopurinol (ZYLOPRIM) 300 MG tablet; Take 1 tablet (300 mg total) by mouth daily.  Dispense: 30 tablet; Refill: 5  5. Need for influenza vaccination - Flu Vaccine QUAD 36+ mos IM   Bari Edward, MD [facility]  09/24/2015

## 2016-03-24 ENCOUNTER — Encounter: Payer: No Typology Code available for payment source | Admitting: Internal Medicine

## 2016-05-09 ENCOUNTER — Ambulatory Visit (INDEPENDENT_AMBULATORY_CARE_PROVIDER_SITE_OTHER): Payer: BLUE CROSS/BLUE SHIELD | Admitting: Internal Medicine

## 2016-05-09 ENCOUNTER — Encounter: Payer: Self-pay | Admitting: Internal Medicine

## 2016-05-09 VITALS — BP 110/70 | HR 60 | Ht 66.0 in | Wt 125.0 lb

## 2016-05-09 DIAGNOSIS — S39012A Strain of muscle, fascia and tendon of lower back, initial encounter: Secondary | ICD-10-CM | POA: Diagnosis not present

## 2016-05-09 DIAGNOSIS — I10 Essential (primary) hypertension: Secondary | ICD-10-CM | POA: Diagnosis not present

## 2016-05-09 MED ORDER — METHOCARBAMOL 500 MG PO TABS: 500.0000 mg | ORAL_TABLET | Freq: Every evening | ORAL | Status: DC | PRN

## 2016-05-09 MED ORDER — AMLODIPINE BESYLATE 10 MG PO TABS: 10.0000 mg | ORAL_TABLET | Freq: Every day | ORAL | Status: DC

## 2016-05-09 NOTE — Progress Notes (Signed)
    Date:  05/09/2016   Name:  Mark May   DOB:  01/05/1956   MRN:  [id number]   Chief Complaint: Back Pain Back Pain This is a new problem. The current episode started in the past 7 days. The problem occurs constantly. The problem has been gradually improving since onset. Pertinent negatives include no chest pain.  He bent over to pick up a light object and felt a pop when he straightened.  He has been using icy hot patches with minimal improvement.  He feels stiff is he stays in one position too long.  He denies leg weakness or numbness.  He has no SOB or chest pains.    Review of Systems  Respiratory: Negative for chest tightness and shortness of breath.   Cardiovascular: Negative for chest pain and palpitations.  Musculoskeletal: Positive for myalgias and back pain. Negative for arthralgias.    Patient Active Problem List   Diagnosis Date Noted  . Controlled gout 06/26/2015  . Benign hypertension 06/26/2015    Prior to Admission medications   Medication Sig Start Date End Date Taking? Authorizing Provider  allopurinol (ZYLOPRIM) 300 MG tablet Take 1 tablet (300 mg total) by mouth daily. 09/24/15  Yes Reubin MilanLaura H Berglund, MD  amLODipine (NORVASC) 10 MG tablet Take 1 tablet (10 mg total) by mouth daily. 09/24/15  Yes Reubin MilanLaura H Berglund, MD  Colchicine 0.6 MG CAPS Take 1 capsule by mouth 2 (two) times daily as needed. 03/25/15  Yes Historical Provider, MD    No Known Allergies  History reviewed. No pertinent past surgical history.  Social History  Substance Use Topics  . Smoking status: Never Smoker   . Smokeless tobacco: None  . Alcohol Use: No     Medication list has been reviewed and updated.   Physical Exam  Constitutional: He is oriented to person, place, and time. He appears well-developed. No distress.  HENT:  Head: Normocephalic and atraumatic.  Cardiovascular: Normal rate, regular rhythm and normal heart sounds.   Pulmonary/Chest: Effort normal and breath sounds  normal. No respiratory distress.  Musculoskeletal: Normal range of motion.       Right hip: Normal.       Left hip: Normal.       Lumbar back: He exhibits no tenderness.       Back:  Neurological: He is alert and oriented to person, place, and time. He has normal reflexes.  Skin: Skin is warm and dry. No rash noted.  Psychiatric: He has a normal mood and affect. His behavior is normal. Thought content normal.    BP 110/70 mmHg  Pulse 60  Ht 5\' 6"  (1.676 m)  Wt 125 lb (56.7 kg)  BMI 20.19 kg/m2  Assessment and Plan: 1. Strain of muscle, fascia and tendon of lower back, initial encounter Use heat and patches if helpful Expect improvement over the next week Avoid heavy lifting and twisting - methocarbamol (ROBAXIN) 500 MG tablet; Take 1 tablet (500 mg total) by mouth at bedtime as needed for muscle spasms.  Dispense: 30 tablet; Refill: 1  2. Benign hypertension controlled - amLODipine (NORVASC) 10 MG tablet; Take 1 tablet (10 mg total) by mouth daily.  Dispense: 30 tablet; Refill: 5   Bari EdwardLaura Berglund, MD [facility]  05/09/2016

## 2016-05-09 NOTE — Patient Instructions (Signed)
Use heat to lower back - 20 minutes three times per day  Take Methocarbamol 500 mg at bedtime as needed for back strain  Can use pain patches if helpful

## 2016-05-14 ENCOUNTER — Other Ambulatory Visit: Payer: Self-pay | Admitting: Internal Medicine

## 2016-05-15 ENCOUNTER — Other Ambulatory Visit: Payer: Self-pay | Admitting: Internal Medicine

## 2016-07-21 ENCOUNTER — Ambulatory Visit (INDEPENDENT_AMBULATORY_CARE_PROVIDER_SITE_OTHER): Payer: BLUE CROSS/BLUE SHIELD | Admitting: Internal Medicine

## 2016-07-21 ENCOUNTER — Encounter: Payer: Self-pay | Admitting: Internal Medicine

## 2016-07-21 VITALS — BP 128/82 | HR 65 | Temp 97.9°F | Resp 16 | Ht 66.0 in | Wt 127.6 lb

## 2016-07-21 DIAGNOSIS — I10 Essential (primary) hypertension: Secondary | ICD-10-CM | POA: Diagnosis not present

## 2016-07-21 DIAGNOSIS — H6692 Otitis media, unspecified, left ear: Secondary | ICD-10-CM | POA: Diagnosis not present

## 2016-07-21 MED ORDER — AZITHROMYCIN 250 MG PO TABS: ORAL_TABLET | ORAL | 0 refills | Status: DC

## 2016-07-21 NOTE — Patient Instructions (Signed)
Otitis Media, Adult °Otitis media is redness, soreness, and puffiness (swelling) in the space just behind your eardrum (middle ear). It may be caused by allergies or infection. It often happens along with a cold. °HOME CARE °· Take your medicine as told. Finish it even if you start to feel better. °· Only take over-the-counter or prescription medicines for pain, discomfort, or fever as told by your doctor. °· Follow up with your doctor as told. °GET HELP IF: °· You have otitis media only in one ear, or bleeding from your nose, or both. °· You notice a lump on your neck. °· You are not getting better in 3-5 days. °· You feel worse instead of better. °GET HELP RIGHT AWAY IF:  °· You have pain that is not helped with medicine. °· You have puffiness, redness, or pain around your ear. °· You get a stiff neck. °· You cannot move part of your face (paralysis). °· You notice that the bone behind your ear hurts when you touch it. °MAKE SURE YOU:  °· Understand these instructions. °· Will watch your condition. °· Will get help right away if you are not doing well or get worse. °  °This information is not intended to replace advice given to you by your health care provider. Make sure you discuss any questions you have with your health care provider. °  °Document Released: 05/05/2008 Document Revised: 12/08/2014 Document Reviewed: 06/14/2013 °Elsevier Interactive Patient Education ©2016 [employer] ° ° °

## 2016-07-21 NOTE — Progress Notes (Signed)
Date:  07/21/2016   Name:  Mark May   DOB:  12/13/1955   MRN:  [id number]   Chief Complaint: Ear Pain (Left since Thursday no fever ) Otalgia   There is pain in the left ear. This is a new problem. The current episode started in the past 7 days. The problem occurs constantly. The problem has been unchanged. There has been no fever. Associated symptoms include a sore throat. Pertinent negatives include no coughing or ear discharge.      Review of Systems  Constitutional: Negative for chills and fever.  HENT: Positive for ear pain and sore throat. Negative for ear discharge and sinus pressure.   Eyes: Negative for visual disturbance.  Respiratory: Negative for cough and shortness of breath.   Cardiovascular: Negative for chest pain.    Patient Active Problem List   Diagnosis Date Noted  . Controlled gout 06/26/2015  . Benign hypertension 06/26/2015    Prior to Admission medications   Medication Sig Start Date End Date Taking? Authorizing Provider  allopurinol (ZYLOPRIM) 300 MG tablet TAKE 1 TABLET (300 MG TOTAL) BY MOUTH DAILY. 05/15/16  Yes Reubin MilanLaura H Brighid Koch, MD  amLODipine (NORVASC) 10 MG tablet TAKE 1 TABLET (10 MG TOTAL) BY MOUTH DAILY. 05/14/16  Yes Reubin MilanLaura H Ande Therrell, MD  Colchicine 0.6 MG CAPS Take 1 capsule by mouth 2 (two) times daily as needed. 03/25/15  Yes Historical Provider, MD  methocarbamol (ROBAXIN) 500 MG tablet Take 1 tablet (500 mg total) by mouth at bedtime as needed for muscle spasms. 05/09/16  Yes Reubin MilanLaura H Laconda Basich, MD    No Known Allergies  History reviewed. No pertinent surgical history.  Social History  Substance Use Topics  . Smoking status: Never Smoker  . Smokeless tobacco: Never Used  . Alcohol use No     Medication list has been reviewed and updated.   Physical Exam  Constitutional: He is oriented to person, place, and time. He appears well-developed. No distress.  HENT:  Head: Normocephalic and atraumatic.  Right Ear: Tympanic membrane  and ear canal normal.  Nose: Right sinus exhibits no maxillary sinus tenderness and no frontal sinus tenderness. Left sinus exhibits no maxillary sinus tenderness and no frontal sinus tenderness.  Mouth/Throat: Uvula is midline and oropharynx is clear and moist.  Left canal completely occluded with cerumen - patient declined flushing or removal with curette.  Neck: Muscular tenderness (left anterior region -) present. Carotid bruit is not present.  Cardiovascular: Normal rate, regular rhythm and normal heart sounds.   Pulmonary/Chest: Effort normal and breath sounds normal. No respiratory distress.  Musculoskeletal: Normal range of motion.  Lymphadenopathy:    He has no cervical adenopathy.  Neurological: He is alert and oriented to person, place, and time.  Skin: Skin is warm and dry. No rash noted.  Psychiatric: He has a normal mood and affect. His speech is normal and behavior is normal.  Nursing note and vitals reviewed.   BP 128/82   Pulse 65   Temp 97.9 F (36.6 C)   Resp 16   Ht 5\' 6"  (1.676 m)   Wt 127 lb 9.6 oz (57.9 kg)   SpO2 100%   BMI 20.60 kg/m   Assessment and Plan: 1. Acute left otitis media, recurrence not specified, unspecified otitis media type Treat empirically - call for ENT referral if no improvement Continue advil tid for pain - azithromycin (ZITHROMAX Z-PAK) 250 MG tablet; Take 2 pills today then 1 pill daily for 4  more days.  Dispense: 6 each; Refill: 0  2. Benign hypertension controlled   Bari EdwardLaura Claudine Stallings, MD [facility]  07/21/2016

## 2016-07-30 ENCOUNTER — Other Ambulatory Visit: Payer: Self-pay | Admitting: Internal Medicine

## 2016-07-30 DIAGNOSIS — H6692 Otitis media, unspecified, left ear: Secondary | ICD-10-CM

## 2016-10-15 ENCOUNTER — Ambulatory Visit
Admission: EM | Admit: 2016-10-15 | Discharge: 2016-10-15 | Disposition: A | Discharge status: Home / Self Care | Payer: BLUE CROSS/BLUE SHIELD | Attending: Family Medicine | Admitting: Family Medicine

## 2016-10-15 DIAGNOSIS — M5432 Sciatica, left side: Secondary | ICD-10-CM | POA: Diagnosis not present

## 2016-10-15 MED ORDER — HYDROCODONE-ACETAMINOPHEN 5-325 MG PO TABS: ORAL_TABLET | ORAL | 0 refills | Status: DC

## 2016-10-15 MED ORDER — CYCLOBENZAPRINE HCL 10 MG PO TABS
10.0000 mg | ORAL_TABLET | Freq: Every day | ORAL | 0 refills | Status: DC
Start: 2016-10-15 — End: 2017-03-24

## 2016-10-15 MED ORDER — PREDNISONE 20 MG PO TABS: ORAL_TABLET | ORAL | 0 refills | Status: DC

## 2016-10-15 NOTE — ED Provider Notes (Signed)
[facility]    CSN: [id number] Arrival date & time: 10/15/16  1235     History   Chief Complaint Chief Complaint  Patient presents with  . Back Pain    HPI Mark May is a 60 y.o. male.   60 yo male presents with a c/o sudden onset of left sided low back pain radiating down the left buttock and into the leg. Pain started this morning as he was getting up from bending down. Denies any direct trauma/injury, fevers, chills, bowel/bladder problems. Reports only medical history is hypertension.    The history is provided by the patient.  Back Pain  Location:  Lumbar spine   Past Medical History:  Diagnosis Date  . Gout   . Hypertension     Patient Active Problem List   Diagnosis Date Noted  . Controlled gout 06/26/2015  . Benign hypertension 06/26/2015    History reviewed. No pertinent surgical history.     Home Medications    Prior to Admission medications   Medication Sig Start Date End Date Taking? Authorizing Provider  cyclobenzaprine (FLEXERIL) 10 MG tablet Take 1 tablet (10 mg total) by mouth at bedtime. 10/15/16   Payton Mccallumrlando Prashant Glosser, MD  HYDROcodone-acetaminophen (NORCO/VICODIN) 5-325 MG tablet 1-2 tabs po q 8 hours prn 10/15/16   Payton Mccallumrlando Revin Corker, MD  predniSONE (DELTASONE) 20 MG tablet 2 tabs po qd for 5 days 10/15/16   Payton Mccallumrlando Addelynn Batte, MD    Family History Family History  Problem Relation Age of Onset  . Diabetes Mother   . Diabetes Father     Social History Social History  Substance Use Topics  . Smoking status: Never Smoker  . Smokeless tobacco: Never Used  . Alcohol use No     Allergies   Patient has no known allergies.   Review of Systems Review of Systems  Musculoskeletal: Positive for back pain.     Physical Exam Triage Vital Signs ED Triage Vitals  Enc Vitals Group     BP 10/15/16 1323 127/74     Pulse Rate 10/15/16 1323 71     Resp 10/15/16 1323 18     Temp 10/15/16 1323 98.7 F (37.1 C)     Temp Source 10/15/16  1323 Oral     SpO2 10/15/16 1323 100 %     Weight 10/15/16 1323 120 lb (54.4 kg)     Height 10/15/16 1323 5\' 6"  (1.676 m)     Head Circumference --      Peak Flow --      Pain Score 10/15/16 1324 8     Pain Loc --      Pain Edu? --      Excl. in GC? --    No data found.   Updated Vital Signs BP 127/74 (BP Location: Left Arm)   Pulse 71   Temp 98.7 F (37.1 C) (Oral)   Resp 18   Ht 5\' 6"  (1.676 m)   Wt 120 lb (54.4 kg)   SpO2 100%   BMI 19.37 kg/m   Visual Acuity Right Eye Distance:   Left Eye Distance:   Bilateral Distance:    Right Eye Near:   Left Eye Near:    Bilateral Near:     Physical Exam  Constitutional: He is oriented to person, place, and time. He appears well-developed and well-nourished. No distress.  Neck: Normal range of motion. Neck supple. No tracheal deviation present.  Pulmonary/Chest: Effort normal. No stridor. No respiratory distress.  Musculoskeletal:  Cervical back: Normal. He exhibits normal range of motion, no tenderness, no bony tenderness, no swelling, no edema, no deformity, no laceration, no pain, no spasm and normal pulse.       Lumbar back: He exhibits tenderness and spasm. He exhibits normal range of motion, no bony tenderness, no swelling, no edema, no deformity, no laceration, no pain and normal pulse.       Back:  Neurological: He is alert and oriented to person, place, and time. He has normal reflexes. He displays normal reflexes. He exhibits normal muscle tone. Coordination normal.  Skin: No rash noted. He is not diaphoretic.  Nursing note and vitals reviewed.    UC Treatments / Results  Labs (all labs ordered are listed, but only abnormal results are displayed) Labs Reviewed - No data to display  EKG  EKG Interpretation None       Radiology No results found.  Procedures Procedures (including critical care time)  Medications Ordered in UC Medications - No data to display   Initial Impression / Assessment  and Plan / UC Course  I have reviewed the triage vital signs and the nursing notes.  Pertinent labs & imaging results that were available during my care of the patient were reviewed by me and considered in my medical decision making (see chart for details).  Clinical Course       Final Clinical Impressions(s) / UC Diagnoses   Final diagnoses:  Sciatica of left side    New Prescriptions New Prescriptions   CYCLOBENZAPRINE (FLEXERIL) 10 MG TABLET    Take 1 tablet (10 mg total) by mouth at bedtime.   HYDROCODONE-ACETAMINOPHEN (NORCO/VICODIN) 5-325 MG TABLET    1-2 tabs po q 8 hours prn   PREDNISONE (DELTASONE) 20 MG TABLET    2 tabs po qd for 5 days   1. diagnosis reviewed with patient (and son) 2. rx as per orders above; reviewed possible side effects, interactions, risks and benefits  3. Recommend supportive treatment with rest, heat/ice  4. Follow-up prn if symptoms worsen or don't improve   Payton Mccallumrlando Kazaria Gaertner, MD 10/15/16 1515

## 2016-10-15 NOTE — ED Triage Notes (Signed)
Pt c/o left sided back pain as of this morning. Son states that he may have pulled a muscle.

## 2016-10-22 ENCOUNTER — Ambulatory Visit
Admission: RE | Admit: 2016-10-22 | Discharge: 2016-10-22 | Disposition: A | Discharge status: Home / Self Care | Payer: BLUE CROSS/BLUE SHIELD | Source: Ambulatory Visit | Attending: Internal Medicine | Admitting: Internal Medicine

## 2016-10-22 ENCOUNTER — Ambulatory Visit (INDEPENDENT_AMBULATORY_CARE_PROVIDER_SITE_OTHER): Payer: BLUE CROSS/BLUE SHIELD | Admitting: Internal Medicine

## 2016-10-22 ENCOUNTER — Other Ambulatory Visit: Payer: Self-pay | Admitting: Internal Medicine

## 2016-10-22 ENCOUNTER — Encounter: Payer: Self-pay | Admitting: Internal Medicine

## 2016-10-22 VITALS — BP 122/84 | HR 74 | Resp 16 | Ht 66.0 in | Wt 122.0 lb

## 2016-10-22 DIAGNOSIS — M5432 Sciatica, left side: Secondary | ICD-10-CM | POA: Insufficient documentation

## 2016-10-22 DIAGNOSIS — I10 Essential (primary) hypertension: Secondary | ICD-10-CM

## 2016-10-22 DIAGNOSIS — M47816 Spondylosis without myelopathy or radiculopathy, lumbar region: Secondary | ICD-10-CM | POA: Diagnosis not present

## 2016-10-22 MED ORDER — TRAMADOL HCL 50 MG PO TABS: 50.0000 mg | ORAL_TABLET | Freq: Three times a day (TID) | ORAL | 0 refills | Status: DC | PRN

## 2016-10-22 NOTE — Progress Notes (Signed)
Date:  10/22/2016   Name:  Mark May   DOB:  12/01/1955   MRN:  [id number]   Chief Complaint: Numbness (L Leg numbness FU MUC feeling better) On week ago stood up and felt pain in his left hip and lower back.  He had continuous pain and now numbness in left leg.  Prednisone and hydrocodone helped the pain but he is still numb.  No loss of bowel or bladder function. He thinks the medication is causing dry mouth.  He has more discomfort when he stands and has been trying not to work much.  Review of Systems  Respiratory: Negative for chest tightness and shortness of breath.   Cardiovascular: Negative for chest pain and palpitations.  Gastrointestinal: Positive for abdominal pain. Negative for diarrhea and nausea.  Genitourinary: Negative for difficulty urinating and dysuria.  Musculoskeletal: Positive for back pain and myalgias.  Neurological: Positive for weakness and numbness.    Patient Active Problem List   Diagnosis Date Noted  . Controlled gout 06/26/2015  . Benign hypertension 06/26/2015    Prior to Admission medications   Medication Sig Start Date End Date Taking? Authorizing Provider  allopurinol (ZYLOPRIM) 300 MG tablet Take 1 tablet by mouth daily. 10/14/16  Yes Historical Provider, MD  amLODipine (NORVASC) 10 MG tablet Take 1 tablet by mouth daily. 10/14/16  Yes Historical Provider, MD  cyclobenzaprine (FLEXERIL) 10 MG tablet Take 1 tablet (10 mg total) by mouth at bedtime. 10/15/16  Yes Payton Mccallumrlando Conty, MD  HYDROcodone-acetaminophen (NORCO/VICODIN) 5-325 MG tablet 1-2 tabs po q 8 hours prn 10/15/16  Yes Payton Mccallumrlando Conty, MD  predniSONE (DELTASONE) 20 MG tablet 2 tabs po qd for 5 days 10/15/16  Yes Payton Mccallumrlando Conty, MD    No Known Allergies  History reviewed. No pertinent surgical history.  Social History  Substance Use Topics  . Smoking status: Never Smoker  . Smokeless tobacco: Never Used  . Alcohol use No     Medication list has been reviewed and  updated.   Physical Exam  Constitutional: He is oriented to person, place, and time. He appears well-developed and well-nourished. No distress.  Neck: Normal range of motion. Neck supple.  Cardiovascular: Normal rate, regular rhythm and normal heart sounds.   Pulmonary/Chest: Breath sounds normal.  Abdominal: Soft.  Musculoskeletal:       Right hip: He exhibits normal range of motion and no tenderness.       Left hip: He exhibits normal range of motion and no tenderness.       Lumbar back: He exhibits normal range of motion and no tenderness.  Neurological: He is alert and oriented to person, place, and time.  Reflex Scores:      Patellar reflexes are 2+ on the right side and 2+ on the left side.      Achilles reflexes are 2+ on the right side and 2+ on the left side. 4/5 hip flexors 4-/5 foot dorsiflexion SLR positive while sitting on left    BP 122/84   Pulse 74   Resp 16   Ht 5\' 6"  (1.676 m)   Wt 122 lb (55.3 kg)   SpO2 99%   BMI 19.69 kg/m   Assessment and Plan: 1. Sciatica of left side Pain is controlled for now - given tramadol to use as needed for pain Continue flexeril at hs unless dry mouth is too severe Will likely need Ortho referral vs MRI - DG Lumbar Spine Complete; Future - traMADol (ULTRAM) 50 MG  tablet; Take 1 tablet (50 mg total) by mouth every 8 (eight) hours as needed.  Dispense: 30 tablet; Refill: 0  2. Benign hypertension controlled   Bari EdwardLaura Yoandri Congrove, MD [facility]  10/22/2016

## 2016-10-22 NOTE — Patient Instructions (Signed)
Take Tramadol only as needed for pain  Can stop cyclobenzaprine if causing dry mouth

## 2016-12-10 ENCOUNTER — Other Ambulatory Visit: Payer: Self-pay | Admitting: Internal Medicine

## 2017-03-24 ENCOUNTER — Ambulatory Visit
Admission: EM | Admit: 2017-03-24 | Discharge: 2017-03-24 | Disposition: A | Discharge status: Home / Self Care | Payer: BLUE CROSS/BLUE SHIELD | Attending: Family Medicine | Admitting: Family Medicine

## 2017-03-24 DIAGNOSIS — R112 Nausea with vomiting, unspecified: Secondary | ICD-10-CM | POA: Diagnosis not present

## 2017-03-24 DIAGNOSIS — H8110 Benign paroxysmal vertigo, unspecified ear: Secondary | ICD-10-CM | POA: Diagnosis not present

## 2017-03-24 MED ORDER — ONDANSETRON 8 MG PO TBDP
8.0000 mg | ORAL_TABLET | Freq: Once | ORAL | Status: AC
  Administered 2017-03-24: 8 mg via ORAL

## 2017-03-24 MED ORDER — MECLIZINE HCL 25 MG PO TABS
25.0000 mg | ORAL_TABLET | Freq: Once | ORAL | Status: AC
  Administered 2017-03-24: 25 mg via ORAL

## 2017-03-24 MED ORDER — MECLIZINE HCL 12.5 MG PO TABS: 12.5000 mg | ORAL_TABLET | Freq: Three times a day (TID) | ORAL | 0 refills | Status: DC | PRN

## 2017-03-24 MED ORDER — ONDANSETRON 8 MG PO TBDP: 8.0000 mg | ORAL_TABLET | Freq: Three times a day (TID) | ORAL | 0 refills | Status: DC | PRN

## 2017-03-24 NOTE — ED Triage Notes (Signed)
Pt c/o dizziness, and vomiting since yesterday.

## 2017-03-24 NOTE — ED Provider Notes (Signed)
[facility]    CSN: [id number] Arrival date & time: 03/24/17  1221     History   Chief Complaint Chief Complaint  Patient presents with  . Dizziness  . Vomiting    HPI Saif Peter is a 61 y.o. male.   The history is provided by the patient.  Dizziness  Quality:  Vertigo Severity:  Moderate Onset quality:  Sudden Duration:  4 hours Timing:  Intermittent Progression:  Unchanged Chronicity:  New Context: bending over and head movement   Context: not with bowel movement, not with ear pain, not with eye movement, not with inactivity, not with loss of consciousness, not with medication, not with physical activity, not when standing up and not when urinating   Relieved by:  None tried Ineffective treatments:  None tried Associated symptoms: nausea and vomiting (twice today)   Associated symptoms: no blood in stool, no chest pain, no diarrhea, no headaches, no hearing loss, no palpitations, no shortness of breath, no syncope, no tinnitus, no vision changes and no weakness   Risk factors: no anemia, no heart disease, no hx of stroke, no hx of vertigo, no Meniere's disease, no multiple medications and no new medications     Past Medical History:  Diagnosis Date  . Gout   . Hypertension     Patient Active Problem List   Diagnosis Date Noted  . Sciatica of left side 10/22/2016  . Controlled gout 06/26/2015  . Benign hypertension 06/26/2015    History reviewed. No pertinent surgical history.     Home Medications    Prior to Admission medications   Medication Sig Start Date End Date Taking? Authorizing Provider  amLODipine (NORVASC) 10 MG tablet Take 1 tablet by mouth daily. 10/14/16  Yes Historical Provider, MD  traMADol (ULTRAM) 50 MG tablet Take 1 tablet (50 mg total) by mouth every 8 (eight) hours as needed. 10/22/16  Yes Reubin Milan, MD  meclizine (ANTIVERT) 12.5 MG tablet Take 1 tablet (12.5 mg total) by mouth 3 (three) times daily as needed for  dizziness. 03/24/17   Payton Mccallum, MD  ondansetron (ZOFRAN ODT) 8 MG disintegrating tablet Take 1 tablet (8 mg total) by mouth every 8 (eight) hours as needed. 03/24/17   Payton Mccallum, MD    Family History Family History  Problem Relation Age of Onset  . Diabetes Mother   . Diabetes Father     Social History Social History  Substance Use Topics  . Smoking status: Never Smoker  . Smokeless tobacco: Never Used  . Alcohol use No     Allergies   Patient has no known allergies.   Review of Systems Review of Systems  HENT: Negative for hearing loss and tinnitus.   Respiratory: Negative for shortness of breath.   Cardiovascular: Negative for chest pain, palpitations and syncope.  Gastrointestinal: Positive for nausea and vomiting (twice today). Negative for blood in stool and diarrhea.  Neurological: Positive for dizziness. Negative for weakness and headaches.     Physical Exam Triage Vital Signs ED Triage Vitals  Enc Vitals Group     BP 03/24/17 1244 (!) 153/82     Pulse Rate 03/24/17 1244 67     Resp 03/24/17 1244 18     Temp 03/24/17 1244 97.5 F (36.4 C)     Temp Source 03/24/17 1244 Oral     SpO2 03/24/17 1244 99 %     Weight 03/24/17 1242 120 lb (54.4 kg)     Height 03/24/17  1242  (1.702 m)     Head Circumference --      Peak Flow --      Pain Score 03/24/17 1243 5     Pain Loc --      Pain Edu? --      Excl. in GC? --    Orthostatic VS for the past 24 hrs:  BP- Lying Pulse- Lying BP- Sitting Pulse- Sitting BP- Standing at 0 minutes Pulse- Standing at 0 minutes  03/24/17 1247 143/81 68 (!) 153/91 69 136/84 70    Updated Vital Signs BP (!) 153/82 (BP Location: Left Arm)   Pulse 67   Temp 97.5 F (36.4 C) (Oral)   Resp 18   Ht  (1.702 m)   Wt 120 lb (54.4 kg)   SpO2 99%   BMI 18.79 kg/m   Visual Acuity Right Eye Distance:   Left Eye Distance:   Bilateral Distance:    Right Eye Near:   Left Eye Near:    Bilateral Near:      Physical Exam  Constitutional: He is oriented to person, place, and time. He appears well-developed and well-nourished. No distress.  HENT:  Head: Normocephalic and atraumatic.  Right Ear: Tympanic membrane, external ear and ear canal normal.  Left Ear: Tympanic membrane, external ear and ear canal normal.  Nose: Nose normal.  Mouth/Throat: Uvula is midline, oropharynx is clear and moist and mucous membranes are normal. No oropharyngeal exudate or tonsillar abscesses.  Eyes: Conjunctivae and EOM are normal. Pupils are equal, round, and reactive to light. Right eye exhibits no discharge. Left eye exhibits no discharge. No scleral icterus.  Neck: Normal range of motion. Neck supple. No tracheal deviation present. No thyromegaly present.  Cardiovascular: Normal rate, regular rhythm and normal heart sounds.   Pulmonary/Chest: Effort normal and breath sounds normal. No stridor. No respiratory distress. He has no wheezes. He has no rales. He exhibits no tenderness.  Abdominal: Soft. Bowel sounds are normal. He exhibits no distension and no mass. There is no tenderness. There is no rebound and no guarding. No hernia.  Lymphadenopathy:    He has no cervical adenopathy.  Neurological: He is alert and oriented to person, place, and time. He displays normal reflexes. No cranial nerve deficit or sensory deficit. He exhibits normal muscle tone. Coordination normal.  Positive Hallpike maneuver  Skin: Skin is warm and dry. No rash noted. He is not diaphoretic.  Nursing note and vitals reviewed.    UC Treatments / Results  Labs (all labs ordered are listed, but only abnormal results are displayed) Labs Reviewed - No data to display  EKG  EKG Interpretation None       Radiology No results found.  Procedures Procedures (including critical care time)  Medications Ordered in UC Medications  ondansetron (ZOFRAN-ODT) disintegrating tablet 8 mg (8 mg Oral Given 03/24/17 1252)  meclizine  (ANTIVERT) tablet 25 mg (25 mg Oral Given 03/24/17 1322)     Initial Impression / Assessment and Plan / UC Course  I have reviewed the triage vital signs and the nursing notes.  Pertinent labs & imaging results that were available during my care of the patient were reviewed by me and considered in my medical decision making (see chart for details).       Final Clinical Impressions(s) / UC Diagnoses   Final diagnoses:  Benign paroxysmal positional vertigo, unspecified laterality  Non-intractable vomiting with nausea, unspecified vomiting type    New Prescriptions Discharge Medication  List as of 03/24/2017  1:44 PM    START taking these medications   Details  meclizine (ANTIVERT) 12.5 MG tablet Take 1 tablet (12.5 mg total) by mouth 3 (three) times daily as needed for dizziness., Starting Tue 03/24/2017, Normal    ondansetron (ZOFRAN ODT) 8 MG disintegrating tablet Take 1 tablet (8 mg total) by mouth every 8 (eight) hours as needed., Starting Tue 03/24/2017, Normal       1. diagnosis reviewed with patient 2. rx as per orders above; reviewed possible side effects, interactions, risks and benefits  3. Recommend supportive treatment with clear liquids/increased fluids, then advance slowly as tolerated 4. Follow-up prn if symptoms worsen or don't improve   Payton Mccallum, MD 03/24/17 1349

## 2017-05-16 ENCOUNTER — Other Ambulatory Visit: Payer: Self-pay | Admitting: Internal Medicine

## 2017-05-18 ENCOUNTER — Telehealth: Payer: Self-pay | Admitting: Internal Medicine

## 2017-05-18 NOTE — Telephone Encounter (Signed)
Left message with wife to call have patient call back to set up appt.

## 2017-07-23 ENCOUNTER — Other Ambulatory Visit: Payer: Self-pay | Admitting: Internal Medicine

## 2017-07-23 NOTE — Telephone Encounter (Signed)
Left a message with pt daughter to have him call me back to set up appt.

## 2017-07-24 NOTE — Telephone Encounter (Signed)
ADVISED

## 2017-07-31 ENCOUNTER — Other Ambulatory Visit: Payer: Self-pay | Admitting: Internal Medicine

## 2017-08-07 ENCOUNTER — Encounter: Payer: Self-pay | Admitting: Internal Medicine

## 2017-08-07 ENCOUNTER — Ambulatory Visit (INDEPENDENT_AMBULATORY_CARE_PROVIDER_SITE_OTHER): Payer: BLUE CROSS/BLUE SHIELD | Admitting: Internal Medicine

## 2017-08-07 VITALS — BP 122/72 | HR 70 | Ht 66.0 in | Wt 127.0 lb

## 2017-08-07 DIAGNOSIS — M5432 Sciatica, left side: Secondary | ICD-10-CM

## 2017-08-07 DIAGNOSIS — I10 Essential (primary) hypertension: Secondary | ICD-10-CM | POA: Diagnosis not present

## 2017-08-07 DIAGNOSIS — M109 Gout, unspecified: Secondary | ICD-10-CM | POA: Diagnosis not present

## 2017-08-07 MED ORDER — ALLOPURINOL 100 MG PO TABS: 300.0000 mg | ORAL_TABLET | Freq: Every day | ORAL | 3 refills | Status: DC

## 2017-08-07 MED ORDER — AMLODIPINE BESYLATE 10 MG PO TABS: 10.0000 mg | ORAL_TABLET | Freq: Every day | ORAL | 5 refills | Status: DC

## 2017-08-07 NOTE — Progress Notes (Signed)
Date:  08/07/2017   Name:  Mark May   DOB:  12/13/1955   MRN:  [id number]   Chief Complaint: Hypertension  Hypertension  This is a chronic problem. The problem is controlled. Pertinent negatives include no chest pain, headaches, palpitations or shortness of breath. Past treatments include calcium channel blockers. There are no compliance problems.    Gout - no recent episodes. Taking allopurinol 300 mg daily.  He says he had labs last month by immigration services that were all normal so he does not want to do labs today.  Sciatica - has some heaviness in his left leg but no pain.  Not taking any tylenol or advil.  Working at [employer].   Review of Systems  Constitutional: Negative for chills, fatigue and fever.  Respiratory: Negative for chest tightness and shortness of breath.   Cardiovascular: Negative for chest pain and palpitations.  Gastrointestinal: Negative for abdominal pain.  Musculoskeletal: Positive for back pain. Negative for arthralgias, gait problem and joint swelling.  Neurological: Negative for dizziness and headaches.    Patient Active Problem List   Diagnosis Date Noted  . Sciatica of left side 10/22/2016  . Controlled gout 06/26/2015  . Benign hypertension 06/26/2015    Prior to Admission medications   Medication Sig Start Date End Date Taking? Authorizing Provider  allopurinol (ZYLOPRIM) 100 MG tablet **TAKE 3 TABLETS (300 MG TOTAL) BY MOUTH DAILY** (300 MG TABS ON BACK ORDER) 07/31/17  Yes Reubin Milan, MD  amLODipine (NORVASC) 10 MG tablet Take 1 tablet by mouth daily. 10/14/16  Yes [provider]    No Known Allergies  History reviewed. No pertinent surgical history.  Social History  Substance Use Topics  . Smoking status: Never Smoker  . Smokeless tobacco: Never Used  . Alcohol use No     Medication list has been reviewed and updated.  PHQ 2/9 Scores 08/07/2017  PHQ - 2 Score 0    Physical Exam  Constitutional: He is  oriented to person, place, and time. He appears well-developed. No distress.  HENT:  Head: Normocephalic and atraumatic.  Neck: Normal range of motion. Neck supple. No thyromegaly present.  Cardiovascular: Normal rate, regular rhythm and normal heart sounds.   Pulmonary/Chest: Effort normal and breath sounds normal. No respiratory distress. He has no wheezes.  Musculoskeletal: Normal range of motion. He exhibits no edema or tenderness.  Neurological: He is alert and oriented to person, place, and time.  Skin: Skin is warm and dry. No rash noted.  Psychiatric: He has a normal mood and affect. His behavior is normal. Thought content normal.  Nursing note and vitals reviewed.   BP 122/72   Pulse 70   Ht  (1.676 m)   Wt 127 lb (57.6 kg)   SpO2 98%   BMI 20.50 kg/m   Assessment and Plan: 1. Benign hypertension controlled - amLODipine (NORVASC) 10 MG tablet; Take 1 tablet (10 mg total) by mouth daily.  Dispense: 30 tablet; Refill: 5  2. Controlled gout Stable - pt declined labs  3. Sciatica of left side Stable If he has pain, recommend Tylenol   Meds ordered this encounter  Medications  . allopurinol (ZYLOPRIM) 100 MG tablet    Sig: Take 3 tablets (300 mg total) by mouth daily.    Dispense:  90 tablet    Refill:  3  . amLODipine (NORVASC) 10 MG tablet    Sig: Take 1 tablet (10 mg total) by mouth daily.  Dispense:  30 tablet    Refill:  5    Partially dictated using Animal nutritionistDragon software. Any errors are unintentional.  Bari EdwardLaura Berglund, MD [facility]  08/07/2017

## 2018-01-04 ENCOUNTER — Ambulatory Visit (INDEPENDENT_AMBULATORY_CARE_PROVIDER_SITE_OTHER): Payer: Self-pay | Admitting: Internal Medicine

## 2018-01-04 ENCOUNTER — Encounter: Payer: Self-pay | Admitting: Internal Medicine

## 2018-01-04 VITALS — BP 128/78 | HR 76 | Ht 66.0 in | Wt 128.0 lb

## 2018-01-04 DIAGNOSIS — I10 Essential (primary) hypertension: Secondary | ICD-10-CM

## 2018-01-04 DIAGNOSIS — M5432 Sciatica, left side: Secondary | ICD-10-CM

## 2018-01-04 DIAGNOSIS — M109 Gout, unspecified: Secondary | ICD-10-CM

## 2018-01-04 MED ORDER — PREDNISONE 10 MG PO TABS: ORAL_TABLET | ORAL | 0 refills | Status: DC

## 2018-01-04 NOTE — Progress Notes (Signed)
Date:  01/04/2018   Name:  Mark May   DOB:  1956/11/30   MRN:  [id number]   Chief Complaint: Sciatica (Follow up- States leg is better. Noticed patient is limping when walking to room.) and Hypertension  Hypertension  This is a chronic problem. The problem has been waxing and waning (BP usually 130-140 at home but sometimes 170.) since onset. Associated symptoms include headaches. Pertinent negatives include no blurred vision, chest pain, palpitations, peripheral edema or shortness of breath. (Temporal headache and dizziness) There are no associated agents to hypertension. Past treatments include calcium channel blockers. The current treatment provides significant improvement.  Extremity Weakness   The pain is present in the left lower leg. This is a chronic problem. There has been no history of extremity trauma. The problem occurs constantly. The problem has been gradually worsening. The quality of the pain is described as aching. The patient is experiencing no pain. Pertinent negatives include no fever or numbness. The treatment provided no relief.      Review of Systems  Constitutional: Negative for chills, fatigue and fever.  HENT: Negative for ear pain.   Eyes: Negative for blurred vision and visual disturbance.  Respiratory: Negative for chest tightness, shortness of breath and wheezing.   Cardiovascular: Negative for chest pain and palpitations.  Gastrointestinal: Negative for abdominal pain.  Musculoskeletal: Positive for arthralgias, extremity weakness and gait problem.  Skin: Negative for color change and rash.  Neurological: Positive for dizziness and headaches. Negative for numbness. Weakness: in left leg.  Hematological: Negative for adenopathy.  Psychiatric/Behavioral: Negative for sleep disturbance.    Patient Active Problem List   Diagnosis Date Noted  . Sciatica of left side 10/22/2016  . Controlled gout 06/26/2015  . Benign hypertension 06/26/2015    Prior to  Admission medications   Medication Sig Start Date End Date Taking? Authorizing Provider  allopurinol (ZYLOPRIM) 100 MG tablet Take 3 tablets (300 mg total) by mouth daily. 08/07/17  Yes Reubin Milan, MD  amLODipine (NORVASC) 10 MG tablet Take 1 tablet (10 mg total) by mouth daily. 08/07/17  Yes Reubin Milan, MD    No Known Allergies  History reviewed. No pertinent surgical history.  Social History   Tobacco Use  . Smoking status: Never Smoker  . Smokeless tobacco: Never Used  Substance Use Topics  . Alcohol use: No    Alcohol/week: 0.0 oz  . Drug use: No     Medication list has been reviewed and updated.  PHQ 2/9 Scores 08/07/2017  PHQ - 2 Score 0    Physical Exam  Constitutional: He appears well-developed and well-nourished.  Neck: Normal range of motion. Neck supple. Carotid bruit is not present.  Cardiovascular: Normal rate, regular rhythm and normal heart sounds.  Pulmonary/Chest: Effort normal and breath sounds normal. No respiratory distress. He has no wheezes.  Musculoskeletal:       Lumbar back: He exhibits no tenderness.  Neurological: He displays no atrophy and no tremor.  Reflex Scores:      Patellar reflexes are 2+ on the right side and 1+ on the left side.      Achilles reflexes are 2+ on the right side and 1+ on the left side. Decreased strength in LLE Decreased reflexes No temporal artery cord or tenderness    BP 128/78   Pulse 76   Ht 5\' 6"  (1.676 m)   Wt 128 lb (58.1 kg)   SpO2 97%   BMI 20.66 kg/m  Assessment and Plan: 1. Sciatica of left side without back pain Need to get Xrays and refer for PTx prior to MRI Insurance not active so pt's daughter will get that sorted out Return OV in one week - predniSONE (DELTASONE) 10 MG tablet; Take 6 on day 1and 2, 5 on day 3 and 4, 4 on day 5 and 6 , 3 on day 7 and 8, 2 on day 9 and 10 and 1 on day 11 and 12 then stop.  Dispense: 42 tablet; Refill: 0  2. Benign hypertension Controlled Continue  current medication Labs next visit  3. Controlled gout Continue allopurinol   Meds ordered this encounter  Medications  . predniSONE (DELTASONE) 10 MG tablet    Sig: Take 6 on day 1and 2, 5 on day 3 and 4, 4 on day 5 and 6 , 3 on day 7 and 8, 2 on day 9 and 10 and 1 on day 11 and 12 then stop.    Dispense:  42 tablet    Refill:  0    Partially dictated using Animal nutritionistDragon software. Any errors are unintentional.  Bari EdwardLaura Froylan Hobby, MD [facility]  01/04/2018

## 2018-01-20 ENCOUNTER — Other Ambulatory Visit: Payer: Self-pay | Admitting: Internal Medicine

## 2018-01-20 ENCOUNTER — Telehealth: Payer: Self-pay | Admitting: Internal Medicine

## 2018-01-20 DIAGNOSIS — M5432 Sciatica, left side: Secondary | ICD-10-CM

## 2018-01-20 NOTE — Telephone Encounter (Signed)
Need to refer to Orthopedics.

## 2018-01-20 NOTE — Telephone Encounter (Signed)
Pt out of medication given for the leg numbness it has not gotten any better, wants to know if he can get a refill or needs to be seen again. Please advise.

## 2018-01-20 NOTE — Telephone Encounter (Signed)
Please Advise previous message.

## 2018-01-20 NOTE — Telephone Encounter (Signed)
Referral placed by Dr Judithann GravesBerglund. Patient wants to stay close to home. Patient informed of referral.

## 2018-01-29 ENCOUNTER — Other Ambulatory Visit: Payer: Self-pay | Admitting: Orthopedic Surgery

## 2018-01-29 DIAGNOSIS — M5410 Radiculopathy, site unspecified: Secondary | ICD-10-CM

## 2018-01-29 DIAGNOSIS — M5440 Lumbago with sciatica, unspecified side: Principal | ICD-10-CM

## 2018-01-29 DIAGNOSIS — M5136 Other intervertebral disc degeneration, lumbar region: Secondary | ICD-10-CM

## 2018-01-29 DIAGNOSIS — M545 Low back pain, unspecified: Secondary | ICD-10-CM

## 2018-02-01 ENCOUNTER — Other Ambulatory Visit: Payer: Self-pay | Admitting: Orthopedic Surgery

## 2018-02-01 DIAGNOSIS — M5136 Other intervertebral disc degeneration, lumbar region: Secondary | ICD-10-CM

## 2018-02-01 DIAGNOSIS — M5440 Lumbago with sciatica, unspecified side: Principal | ICD-10-CM

## 2018-02-01 DIAGNOSIS — M5410 Radiculopathy, site unspecified: Secondary | ICD-10-CM

## 2018-02-01 DIAGNOSIS — M545 Low back pain, unspecified: Secondary | ICD-10-CM

## 2018-02-16 ENCOUNTER — Ambulatory Visit
Admission: RE | Admit: 2018-02-16 | Discharge: 2018-02-16 | Disposition: A | Discharge status: Home / Self Care | Payer: BLUE CROSS/BLUE SHIELD | Source: Ambulatory Visit | Attending: Orthopedic Surgery | Admitting: Orthopedic Surgery

## 2018-02-16 DIAGNOSIS — M5440 Lumbago with sciatica, unspecified side: Secondary | ICD-10-CM | POA: Diagnosis present

## 2018-02-16 DIAGNOSIS — M5126 Other intervertebral disc displacement, lumbar region: Secondary | ICD-10-CM | POA: Insufficient documentation

## 2018-02-16 DIAGNOSIS — M5410 Radiculopathy, site unspecified: Secondary | ICD-10-CM | POA: Diagnosis present

## 2018-02-16 DIAGNOSIS — M5136 Other intervertebral disc degeneration, lumbar region: Secondary | ICD-10-CM

## 2018-02-16 DIAGNOSIS — M545 Low back pain, unspecified: Secondary | ICD-10-CM

## 2018-02-17 ENCOUNTER — Ambulatory Visit: Payer: BLUE CROSS/BLUE SHIELD | Admitting: Internal Medicine

## 2018-02-17 ENCOUNTER — Encounter: Payer: Self-pay | Admitting: Internal Medicine

## 2018-02-17 VITALS — BP 132/90 | HR 68 | Ht 66.0 in | Wt 127.0 lb

## 2018-02-17 DIAGNOSIS — I10 Essential (primary) hypertension: Secondary | ICD-10-CM

## 2018-02-17 DIAGNOSIS — M5416 Radiculopathy, lumbar region: Secondary | ICD-10-CM | POA: Insufficient documentation

## 2018-02-17 DIAGNOSIS — M109 Gout, unspecified: Secondary | ICD-10-CM | POA: Diagnosis not present

## 2018-02-17 MED ORDER — AMLODIPINE BESYLATE 10 MG PO TABS: 10.0000 mg | ORAL_TABLET | Freq: Every day | ORAL | 5 refills | Status: AC

## 2018-02-17 MED ORDER — ALLOPURINOL 100 MG PO TABS: 300.0000 mg | ORAL_TABLET | Freq: Every day | ORAL | 3 refills | Status: AC

## 2018-02-17 NOTE — Progress Notes (Signed)
Date:  02/17/2018   Name:  Mark May   DOB:  03/21/1956   MRN:  [id number]  Pt seen today with video interpretor.  Chief Complaint: Hypertension Hypertension  This is a chronic problem. The problem is unchanged. The problem is controlled. Pertinent negatives include no chest pain, headaches, palpitations or shortness of breath.   Gout - he has hx of gout.  On allopurinol 300 mg per day.  He has not had any sx for 2-3 years.  Low back pain with left leg numbness - seen by Dedra Skeensodd Mundy.  MRI done yesterday - minimal disc bulge at L3-4 and L4-5 with mild nerve root compression at L4-5 on left.  He is taking nsaids and muscle relaxants and is improving.  Results were given to patient since a translator was readily available.   Review of Systems  Constitutional: Negative for appetite change, fatigue and unexpected weight change.  Eyes: Negative for visual disturbance.  Respiratory: Negative for cough, shortness of breath and wheezing.   Cardiovascular: Negative for chest pain, palpitations and leg swelling.  Gastrointestinal: Negative for abdominal pain.  Endocrine: Negative for polydipsia and polyuria.  Genitourinary: Negative for dysuria.  Musculoskeletal: Positive for back pain and myalgias (seeing Ortho and now on nsaids/muscle relaxants).  Skin: Negative for color change and rash.  Neurological: Negative for tremors, weakness, numbness and headaches.  Psychiatric/Behavioral: Negative for dysphoric mood.    Patient Active Problem List   Diagnosis Date Noted  . Sciatica of left side 10/22/2016  . Controlled gout 06/26/2015  . Benign hypertension 06/26/2015    Prior to Admission medications   Medication Sig Start Date End Date Taking? Authorizing Provider  allopurinol (ZYLOPRIM) 100 MG tablet Take 3 tablets (300 mg total) by mouth daily. 08/07/17  Yes Reubin MilanBerglund, Joeph Szatkowski H, MD  amLODipine (NORVASC) 10 MG tablet Take 1 tablet (10 mg total) by mouth daily. 08/07/17  Yes Reubin MilanBerglund, Maura Braaten  H, MD  meloxicam (MOBIC) 15 MG tablet Take 15 mg by mouth daily.   Yes [provider]  tizanidine (ZANAFLEX) 2 MG capsule Take 2 mg by mouth 3 (three) times daily.   Yes [provider]    No Known Allergies  History reviewed. No pertinent surgical history.  Social History   Tobacco Use  . Smoking status: Never Smoker  . Smokeless tobacco: Never Used  Substance Use Topics  . Alcohol use: No    Alcohol/week: 0.0 oz  . Drug use: No     Medication list has been reviewed and updated.  PHQ 2/9 Scores 08/07/2017  PHQ - 2 Score 0    Physical Exam  Constitutional: He is oriented to person, place, and time. He appears well-developed. No distress.  HENT:  Head: Normocephalic and atraumatic.  Neck: Normal range of motion. Neck supple. Carotid bruit is not present.  Cardiovascular: Normal rate, regular rhythm and normal heart sounds.  Pulmonary/Chest: Effort normal and breath sounds normal. No respiratory distress. He has no wheezes.  Musculoskeletal: He exhibits no edema or tenderness.  Neurological: He is alert and oriented to person, place, and time.  Skin: Skin is warm and dry. No rash noted.  Psychiatric: He has a normal mood and affect. His behavior is normal. Thought content normal.  Nursing note and vitals reviewed.   BP 132/90   Pulse 68   Ht 5\' 6"  (1.676 m)   Wt 127 lb (57.6 kg)   SpO2 98%   BMI 20.50 kg/m   Assessment and Plan:  1. Benign hypertension controlled - amLODipine (NORVASC) 10 MG tablet; Take 1 tablet (10 mg total) by mouth daily.  Dispense: 30 tablet; Refill: 5  2. Controlled gout Check UA  3. Chronic left lumbar radiculopathy Improving with conservative therapy   Meds ordered this encounter  Medications  . amLODipine (NORVASC) 10 MG tablet    Sig: Take 1 tablet (10 mg total) by mouth daily.    Dispense:  30 tablet    Refill:  5  . allopurinol (ZYLOPRIM) 100 MG tablet    Sig: Take 3 tablets (300 mg total) by mouth daily.      Dispense:  90 tablet    Refill:  3    Partially dictated using Animal nutritionist. Any errors are unintentional.  Bari Edward, MD [facility]  02/17/2018

## 2018-02-18 LAB — BASIC METABOLIC PANEL
BUN/Creatinine Ratio: 20 (ref 10–24)
BUN: 17 mg/dL (ref 8–27)
CO2: 27 mmol/L (ref 20–29)
Calcium: 9.4 mg/dL (ref 8.6–10.2)
Chloride: 102 mmol/L (ref 96–106)
Creatinine, Ser: 0.86 mg/dL (ref 0.76–1.27)
GFR calc Af Amer: 107 mL/min/{1.73_m2} (ref >59)
GFR calc non Af Amer: 92 mL/min/{1.73_m2} (ref >59)
Glucose: 103 mg/dL — ABNORMAL HIGH (ref 65–99)
Potassium: 5 mmol/L (ref 3.5–5.2)
Sodium: 143 mmol/L (ref 134–144)

## 2018-02-18 LAB — URIC ACID: Uric Acid: 6.6 mg/dL (ref 3.7–8.6)

## 2018-03-09 ENCOUNTER — Ambulatory Visit (INDEPENDENT_AMBULATORY_CARE_PROVIDER_SITE_OTHER): Payer: BLUE CROSS/BLUE SHIELD | Admitting: Internal Medicine

## 2018-03-09 ENCOUNTER — Encounter: Payer: Self-pay | Admitting: Internal Medicine

## 2018-03-09 VITALS — BP 112/78 | HR 61 | Ht 66.0 in | Wt 127.0 lb

## 2018-03-09 DIAGNOSIS — M109 Gout, unspecified: Secondary | ICD-10-CM

## 2018-03-09 DIAGNOSIS — Z0001 Encounter for general adult medical examination with abnormal findings: Secondary | ICD-10-CM

## 2018-03-09 DIAGNOSIS — N401 Enlarged prostate with lower urinary tract symptoms: Secondary | ICD-10-CM | POA: Insufficient documentation

## 2018-03-09 DIAGNOSIS — M5416 Radiculopathy, lumbar region: Secondary | ICD-10-CM

## 2018-03-09 DIAGNOSIS — R350 Frequency of micturition: Secondary | ICD-10-CM | POA: Diagnosis not present

## 2018-03-09 DIAGNOSIS — Z23 Encounter for immunization: Secondary | ICD-10-CM | POA: Diagnosis not present

## 2018-03-09 DIAGNOSIS — I10 Essential (primary) hypertension: Secondary | ICD-10-CM

## 2018-03-09 DIAGNOSIS — Z0000 Encounter for general adult medical examination without abnormal findings: Secondary | ICD-10-CM

## 2018-03-09 DIAGNOSIS — Z125 Encounter for screening for malignant neoplasm of prostate: Secondary | ICD-10-CM

## 2018-03-09 LAB — POCT URINALYSIS DIPSTICK
Bilirubin, UA: NEGATIVE
Blood, UA: NEGATIVE
Glucose, UA: NEGATIVE
Ketones, UA: NEGATIVE
Leukocytes, UA: NEGATIVE
Nitrite, UA: NEGATIVE
Protein, UA: NEGATIVE
Spec Grav, UA: 1.01 (ref 1.010–1.025)
Urobilinogen, UA: 0.2 E.U./dL
pH, UA: 6 (ref 5.0–8.0)

## 2018-03-09 MED ORDER — TAMSULOSIN HCL 0.4 MG PO CAPS
0.4000 mg | ORAL_CAPSULE | Freq: Every day | ORAL | 5 refills | Status: AC
Start: 2018-03-09 — End: ?

## 2018-03-09 MED ORDER — MELOXICAM 15 MG PO TABS: 15.0000 mg | ORAL_TABLET | Freq: Every day | ORAL | 5 refills | Status: DC

## 2018-03-09 NOTE — Progress Notes (Signed)
Date:  03/09/2018   Name:  Mark May   DOB:  February 27, 1956   MRN:  [id number]   Chief Complaint: Annual Exam (Requested Interpreter for visit.) and Hypertension Samer Dutton is a 62 y.o. male who presents today for his Complete Annual Exam. He feels well. He reports exercising none. He reports he is sleeping well.  He works about 54 hours per week at [employer]. Exam is conducted with the help of Video interpreter [place].  Hypertension  This is a chronic problem. The problem is unchanged. The problem is controlled. Pertinent negatives include no chest pain, headaches, palpitations or shortness of breath. Past treatments include calcium channel blockers.  Gout - no recent episodes.  Leg pain/numbness - persistent but no weakness.  He does better with medication.  He is out of mobic and would like a refill. PUD - remote hx of partial gastrectomy done in [place].  He feels well with no abdominal complaints, gerd or n/v. BPH - pt reports hx of elevated PSA several years ago in [place].  He had biopsy but there was no cancer.  He was put on medication (name unknown) but stopped it because of cost when he had no insurance.  Now has nocturia x 3 and frequency with incomplete emptying during the day.   Review of Systems  Constitutional: Negative for appetite change, chills, diaphoresis, fatigue and unexpected weight change.  HENT: Negative for hearing loss, tinnitus, trouble swallowing and voice change.   Eyes: Negative for visual disturbance.  Respiratory: Negative for choking, shortness of breath and wheezing.   Cardiovascular: Negative for chest pain, palpitations and leg swelling.  Gastrointestinal: Negative for abdominal pain, blood in stool, constipation and diarrhea.  Genitourinary: Positive for difficulty urinating (incomplete emptying). Negative for dysuria and frequency.  Musculoskeletal: Positive for back pain (some with prolonged work). Negative for arthralgias and myalgias.  Skin:  Negative for color change and rash.  Neurological: Positive for numbness. Negative for dizziness, syncope and headaches.  Hematological: Negative for adenopathy.  Psychiatric/Behavioral: Negative for dysphoric mood and sleep disturbance.    Patient Active Problem List   Diagnosis Date Noted  . Chronic left lumbar radiculopathy 02/17/2018  . Sciatica of left side 10/22/2016  . Controlled gout 06/26/2015  . Benign hypertension 06/26/2015    Prior to Admission medications   Medication Sig Start Date End Date Taking? Authorizing Provider  allopurinol (ZYLOPRIM) 100 MG tablet Take 3 tablets (300 mg total) by mouth daily. 02/17/18  Yes Reubin Milan, MD  amLODipine (NORVASC) 10 MG tablet Take 1 tablet (10 mg total) by mouth daily. 02/17/18  Yes Reubin Milan, MD  meloxicam (MOBIC) 15 MG tablet Take 15 mg by mouth daily.   Yes [provider]  tizanidine (ZANAFLEX) 2 MG capsule Take 2 mg by mouth 3 (three) times daily.   Yes [provider]    No Known Allergies  Past Surgical History:  Procedure Laterality Date  . PARTIAL GASTRECTOMY  1990   done in [place] for PUD  . PROSTATE BIOPSY  ~ 2005   negative for cancer (done in [place])    Social History   Tobacco Use  . Smoking status: Never Smoker  . Smokeless tobacco: Never Used  Substance Use Topics  . Alcohol use: No    Alcohol/week: 0.0 oz  . Drug use: No     Medication list has been reviewed and updated.  PHQ 2/9 Scores 08/07/2017  PHQ - 2 Score 0  Physical Exam  Constitutional: He is oriented to person, place, and time. He appears well-developed and well-nourished.  HENT:  Head: Normocephalic.  Right Ear: Tympanic membrane, external ear and ear canal normal.  Left Ear: External ear normal.  Nose: Nose normal.  Mouth/Throat: Uvula is midline and oropharynx is clear and moist.  Excessive cerumen on left  Eyes: Pupils are equal, round, and reactive to light. Conjunctivae and EOM are normal.  Neck:  Normal range of motion. Neck supple. Carotid bruit is not present. No thyromegaly present.  Cardiovascular: Normal rate, regular rhythm, normal heart sounds and intact distal pulses.  Pulmonary/Chest: Effort normal and breath sounds normal. He has no wheezes. Right breast exhibits no mass. Left breast exhibits no mass.  Abdominal: Soft. Normal appearance and bowel sounds are normal. There is no hepatosplenomegaly. There is no tenderness.  Genitourinary: Rectum normal and penis normal. Rectal exam shows no mass. Prostate is enlarged. Prostate is not tender.  Musculoskeletal: Normal range of motion.  Lymphadenopathy:    He has no cervical adenopathy.  Neurological: He is alert and oriented to person, place, and time. He has normal reflexes.  Skin: Skin is warm, dry and intact.  Psychiatric: He has a normal mood and affect. His speech is normal and behavior is normal. Judgment and thought content normal.  Nursing note and vitals reviewed.   BP 112/78   Pulse 61   Ht 5\' 6"  (1.676 m)   Wt 127 lb (57.6 kg)   SpO2 100%   BMI 20.50 kg/m   Assessment and Plan: 1. Annual physical exam - Lipid panel - POCT urinalysis dipstick  2. Prostate cancer screening May need to see urology if PSA significantly elevated  3. Benign hypertension controlled - CBC with Differential/Platelet - Comprehensive metabolic panel  4. Controlled gout No recent episodes  5. Chronic left lumbar radiculopathy Managed with medication - meloxicam (MOBIC) 15 MG tablet; Take 1 tablet (15 mg total) by mouth daily.  Dispense: 30 tablet; Refill: 5  6. Benign prostatic hyperplasia with urinary frequency Begin flomax - PSA - tamsulosin (FLOMAX) 0.4 MG CAPS capsule; Take 1 capsule (0.4 mg total) by mouth daily after supper.  Dispense: 30 capsule; Refill: 5  7. Need for diphtheria-tetanus-pertussis (Tdap) vaccine - Tdap vaccine greater than or equal to 7yo IM   Meds ordered this encounter  Medications  .  tamsulosin (FLOMAX) 0.4 MG CAPS capsule    Sig: Take 1 capsule (0.4 mg total) by mouth daily after supper.    Dispense:  30 capsule    Refill:  5  . meloxicam (MOBIC) 15 MG tablet    Sig: Take 1 tablet (15 mg total) by mouth daily.    Dispense:  30 tablet    Refill:  5    Partially dictated using Animal nutritionistDragon software. Any errors are unintentional.  Bari EdwardLaura Bristol Osentoski, MD [facility]  03/09/2018

## 2018-03-10 ENCOUNTER — Encounter: Payer: Self-pay | Admitting: Internal Medicine

## 2018-03-10 DIAGNOSIS — E785 Hyperlipidemia, unspecified: Secondary | ICD-10-CM | POA: Insufficient documentation

## 2018-03-10 LAB — COMPREHENSIVE METABOLIC PANEL
ALT: 24 IU/L (ref 0–44)
AST: 25 IU/L (ref 0–40)
Albumin/Globulin Ratio: 1.8 (ref 1.2–2.2)
Albumin: 4.8 g/dL (ref 3.6–4.8)
Alkaline Phosphatase: 102 IU/L (ref 39–117)
BUN/Creatinine Ratio: 16 (ref 10–24)
BUN: 12 mg/dL (ref 8–27)
Bilirubin Total: 0.6 mg/dL (ref 0.0–1.2)
CO2: 25 mmol/L (ref 20–29)
Calcium: 9.2 mg/dL (ref 8.6–10.2)
Chloride: 99 mmol/L (ref 96–106)
Creatinine, Ser: 0.74 mg/dL — ABNORMAL LOW (ref 0.76–1.27)
GFR calc Af Amer: 113 mL/min/{1.73_m2} (ref >59)
GFR calc non Af Amer: 98 mL/min/{1.73_m2} (ref >59)
Globulin, Total: 2.6 g/dL (ref 1.5–4.5)
Glucose: 107 mg/dL — ABNORMAL HIGH (ref 65–99)
Potassium: 4.6 mmol/L (ref 3.5–5.2)
Sodium: 140 mmol/L (ref 134–144)
Total Protein: 7.4 g/dL (ref 6.0–8.5)

## 2018-03-10 LAB — CBC WITH DIFFERENTIAL/PLATELET
Basophils Absolute: 0 10*3/uL (ref 0.0–0.2)
Basos: 0 %
EOS (ABSOLUTE): 0.1 10*3/uL (ref 0.0–0.4)
Eos: 1 %
Hematocrit: 46.9 % (ref 37.5–51.0)
Hemoglobin: 15.9 g/dL (ref 13.0–17.7)
Immature Grans (Abs): 0 10*3/uL (ref 0.0–0.1)
Immature Granulocytes: 0 %
Lymphocytes Absolute: 1.9 10*3/uL (ref 0.7–3.1)
Lymphs: 31 %
MCH: 30.5 pg (ref 26.6–33.0)
MCHC: 33.9 g/dL (ref 31.5–35.7)
MCV: 90 fL (ref 79–97)
Monocytes Absolute: 0.4 10*3/uL (ref 0.1–0.9)
Monocytes: 6 %
Neutrophils Absolute: 3.7 10*3/uL (ref 1.4–7.0)
Neutrophils: 62 %
Platelets: 331 10*3/uL (ref 150–379)
RBC: 5.22 x10E6/uL (ref 4.14–5.80)
RDW: 14.7 % (ref 12.3–15.4)
WBC: 6.1 10*3/uL (ref 3.4–10.8)

## 2018-03-10 LAB — LIPID PANEL
Chol/HDL Ratio: 3.7 ratio (ref 0.0–5.0)
Cholesterol, Total: 216 mg/dL — ABNORMAL HIGH (ref 100–199)
HDL: 59 mg/dL (ref >39)
LDL Calculated: 136 mg/dL — ABNORMAL HIGH (ref 0–99)
Triglycerides: 103 mg/dL (ref 0–149)
VLDL Cholesterol Cal: 21 mg/dL (ref 5–40)

## 2018-03-10 LAB — PSA: Prostate Specific Ag, Serum: 3.1 ng/mL (ref 0.0–4.0)

## 2018-09-08 ENCOUNTER — Ambulatory Visit: Payer: BLUE CROSS/BLUE SHIELD | Admitting: Internal Medicine

## 2019-03-11 ENCOUNTER — Encounter: Payer: BLUE CROSS/BLUE SHIELD | Admitting: Internal Medicine

## 2019-07-27 ENCOUNTER — Encounter: Payer: Self-pay | Admitting: Internal Medicine

## 2020-03-11 ENCOUNTER — Ambulatory Visit
Admission: EM | Admit: 2020-03-11 | Discharge: 2020-03-11 | Disposition: A | Discharge status: Home / Self Care | Payer: 59 | Attending: Emergency Medicine | Admitting: Emergency Medicine

## 2020-03-11 ENCOUNTER — Other Ambulatory Visit: Payer: Self-pay

## 2020-03-11 ENCOUNTER — Encounter: Payer: Self-pay | Admitting: Emergency Medicine

## 2020-03-11 DIAGNOSIS — I10 Essential (primary) hypertension: Secondary | ICD-10-CM | POA: Diagnosis not present

## 2020-03-11 DIAGNOSIS — R42 Dizziness and giddiness: Secondary | ICD-10-CM | POA: Diagnosis not present

## 2020-03-11 MED ORDER — MECLIZINE HCL 25 MG PO TABS: 25.0000 mg | ORAL_TABLET | Freq: Three times a day (TID) | ORAL | 0 refills | Status: DC | PRN

## 2020-03-11 NOTE — ED Triage Notes (Signed)
Patient is here with his son.  Patient c/o dizziness and elevated BP that started last night.  Patient reports HAs.

## 2020-03-11 NOTE — Discharge Instructions (Addendum)
Use caution while taking meclizine.  Do not drive or perform activities requiring concentration or judgment.  Please follow-up with your primary care physician as soon as possible for reevaluation of your hypertension and your hypertension medications.

## 2020-03-11 NOTE — ED Provider Notes (Signed)
[facility]    CSN: [id number] Arrival date & time: 03/11/20  1556      History   Chief Complaint Chief Complaint  Patient presents with  . Dizziness  . Hypertension    HPI Ahmadou Bolz is a 64 y.o. male.   HPI  64 year old male presents accompanied by his daughter provides the translation services.  He has had 2 days of dizziness and elevated blood pressure.  His blood pressure at home was 169/100.  He states that yesterday he took 1 amlodipine 10 mg and then repeated after he saw that his blood pressure was elevated.  Has had nausea but no vomiting.  Is also complained of headaches.  Mostly over the top of his head.  His other complaint is that of the room spinning around him.  This is happened at least 3 times.  He states that he has not able to lay down because of the room spinning so much.  He has not lost balance he has had no syncope or near syncope.  He denies any  focal neurological signs or symptoms.  He used to see Dr. Army Melia but because of insurance considerations he now goes to the health department for primary care.        Past Medical History:  Diagnosis Date  . Gout   . Hypertension     Patient Active Problem List   Diagnosis Date Noted  . Mild hyperlipidemia 03/10/2018  . Benign prostatic hyperplasia with urinary frequency 03/09/2018  . Chronic left lumbar radiculopathy 02/17/2018  . Sciatica of left side 10/22/2016  . Controlled gout 06/26/2015  . Benign hypertension 06/26/2015    Past Surgical History:  Procedure Laterality Date  . PARTIAL GASTRECTOMY  1990   done in [place] for PUD  . PROSTATE BIOPSY  ~ 2005   negative for cancer (done in [place])       Home Medications    Prior to Admission medications   Medication Sig Start Date End Date Taking? Authorizing Provider  amLODipine (NORVASC) 10 MG tablet Take 1 tablet (10 mg total) by mouth daily. 02/17/18  Yes Glean Hess, MD  allopurinol (ZYLOPRIM) 100 MG tablet Take 3  tablets (300 mg total) by mouth daily. 02/17/18   Glean Hess, MD  meclizine (ANTIVERT) 25 MG tablet Take 1 tablet (25 mg total) by mouth 3 (three) times daily as needed for dizziness. 03/11/20   Lorin Picket, PA-C  meloxicam (MOBIC) 15 MG tablet Take 1 tablet (15 mg total) by mouth daily. 03/09/18   Glean Hess, MD  tamsulosin (FLOMAX) 0.4 MG CAPS capsule Take 1 capsule (0.4 mg total) by mouth daily after supper. 03/09/18   Glean Hess, MD  tizanidine (ZANAFLEX) 2 MG capsule Take 2 mg by mouth 3 (three) times daily.    [provider]    Family History Family History  Problem Relation Age of Onset  . Diabetes Mother   . Diabetes Father     Social History Social History   Tobacco Use  . Smoking status: Never Smoker  . Smokeless tobacco: Never Used  Substance Use Topics  . Alcohol use: No    Alcohol/week: 0.0 standard drinks  . Drug use: No     Allergies   Patient has no known allergies.   Review of Systems Review of Systems  Constitutional: Positive for activity change and appetite change. Negative for chills, diaphoresis, fatigue and fever.  Neurological: Positive for dizziness and headaches. Negative  for tremors, seizures, syncope, facial asymmetry, speech difficulty, weakness and numbness.  All other systems reviewed and are negative.    Physical Exam Triage Vital Signs ED Triage Vitals  Enc Vitals Group     BP 03/11/20 1613 (!) 169/104     Pulse Rate 03/11/20 1613 97     Resp 03/11/20 1613 16     Temp 03/11/20 1613 98 F (36.7 C)     Temp Source 03/11/20 1613 Oral     SpO2 03/11/20 1613 100 %     Weight 03/11/20 1610 125 lb (56.7 kg)     Height 03/11/20 1610 5\' 7"  (1.702 m)     Head Circumference --      Peak Flow --      Pain Score 03/11/20 1609 5     Pain Loc --      Pain Edu? --      Excl. in GC? --    No data found.  Updated Vital Signs BP (!) 150/88 (BP Location: Right Arm)   Pulse 97   Temp 98 F (36.7 C) (Oral)    Resp 16   Ht 5\' 7"  (1.702 m)   Wt 125 lb (56.7 kg)   SpO2 100%   BMI 19.58 kg/m   Visual Acuity Right Eye Distance:   Left Eye Distance:   Bilateral Distance:    Right Eye Near:   Left Eye Near:    Bilateral Near:     Physical Exam Vitals and nursing note reviewed.  Constitutional:      General: He is not in acute distress.    Appearance: Normal appearance. He is normal weight. He is not ill-appearing or toxic-appearing.  HENT:     Head: Normocephalic and atraumatic.     Right Ear: There is impacted cerumen.     Left Ear: There is impacted cerumen.     Nose: Nose normal.     Mouth/Throat:     Mouth: Mucous membranes are moist.     Pharynx: Oropharynx is clear. No oropharyngeal exudate or posterior oropharyngeal erythema.  Eyes:     Extraocular Movements: Extraocular movements intact.     Conjunctiva/sclera: Conjunctivae normal.     Pupils: Pupils are equal, round, and reactive to light.  Cardiovascular:     Rate and Rhythm: Normal rate and regular rhythm.     Heart sounds: Normal heart sounds.  Pulmonary:     Effort: Pulmonary effort is normal.     Breath sounds: Normal breath sounds.  Musculoskeletal:        General: Normal range of motion.     Cervical back: Normal range of motion and neck supple.  Skin:    General: Skin is warm and dry.  Neurological:     General: No focal deficit present.     Mental Status: He is alert and oriented to person, place, and time.     Cranial Nerves: No cranial nerve deficit.     Sensory: No sensory deficit.     Motor: No weakness.     Coordination: Coordination normal.     Gait: Gait normal.  Psychiatric:        Mood and Affect: Mood normal.        Behavior: Behavior normal.        Thought Content: Thought content normal.        Judgment: Judgment normal.      UC Treatments / Results  Labs (all labs ordered are listed, but only abnormal results  are displayed) Labs Reviewed - No data to display  EKG   Radiology No  results found.  Procedures Procedures (including critical care time)  Medications Ordered in UC Medications - No data to display  Initial Impression / Assessment and Plan / UC Course  I have reviewed the triage vital signs and the nursing notes.  Pertinent labs & imaging results that were available during my care of the patient were reviewed by me and considered in my medical decision making (see chart for details).   64 year old male presents accompanied by his daughter who provides translational services complaining of dizziness elevated blood pressure and headaches along with nausea.  The main complaint seems to be that of dizziness and the room spinning particularly with lying down.  Yesterday when he noticed his blood pressure was elevated at home by home cuff he took a second amlodipine 10 mg.  He took his amlodipine 10 mg today but was still having high blood pressure and they presented here.  Physical exam revealed a nonfocal neurological exam.  Examination of his ears shows bilateral cerumen impaction.  Repeat of his blood pressure with a manual cuff revealed it to be 150/88.  Is a last of visit to primary care noted on the record was in 2019.  He evidently sees the health department primary care for all of his medical needs.  Because of his blood pressure was elevated initially I have recommended that he follow-up for reevaluation at his primary care's office.  For the vertigo, place him on meclizine 25 mg 3 times daily as necessary for dizziness.  Was given cautions regarding driving as well as performing activities requiring concentration or judgment.  If he worsens or has any change in his symptoms he should go to the emergency room.   Final Clinical Impressions(s) / UC Diagnoses   Final diagnoses:  Vertigo  Essential hypertension     Discharge Instructions     Use caution while taking meclizine.  Do not drive or perform activities requiring concentration or judgment.  Please  follow-up with your primary care physician as soon as possible for reevaluation of your hypertension and your hypertension medications.    ED Prescriptions    Medication Sig Dispense Auth. Provider   meclizine (ANTIVERT) 25 MG tablet Take 1 tablet (25 mg total) by mouth 3 (three) times daily as needed for dizziness. 30 tablet Lutricia Feil, PA-C     PDMP not reviewed this encounter.   Lutricia Feil, PA-C 03/11/20 1714

## 2020-10-05 ENCOUNTER — Inpatient Hospital Stay (HOSPITAL_COMMUNITY): Payer: 59

## 2020-10-05 ENCOUNTER — Encounter: Payer: Self-pay | Admitting: Emergency Medicine

## 2020-10-05 ENCOUNTER — Emergency Department (HOSPITAL_COMMUNITY): Payer: 59

## 2020-10-05 ENCOUNTER — Emergency Department
Admission: EM | Admit: 2020-10-05 | Discharge: 2020-10-05 | Disposition: A | Discharge status: Other Acute Inpatient Hospital | Payer: 59 | Attending: Student in an Organized Health Care Education/Training Program | Admitting: Student in an Organized Health Care Education/Training Program

## 2020-10-05 ENCOUNTER — Emergency Department (HOSPITAL_COMMUNITY): Payer: 59 | Admitting: Certified Registered Nurse Anesthetist

## 2020-10-05 ENCOUNTER — Encounter (HOSPITAL_COMMUNITY): Payer: Self-pay | Admitting: Certified Registered"

## 2020-10-05 ENCOUNTER — Inpatient Hospital Stay (HOSPITAL_COMMUNITY)
Admission: EM | Admit: 2020-10-05 | Discharge: 2020-12-01 | DRG: 023 | Disposition: E | Payer: 59 | Attending: Pulmonary Disease | Admitting: Pulmonary Disease

## 2020-10-05 ENCOUNTER — Emergency Department: Payer: 59

## 2020-10-05 ENCOUNTER — Inpatient Hospital Stay: Admit: 2020-10-05 | Payer: 59

## 2020-10-05 ENCOUNTER — Encounter (HOSPITAL_COMMUNITY): Admission: EM | Disposition: E | Payer: Self-pay | Source: Home / Self Care | Attending: Pulmonary Disease

## 2020-10-05 ENCOUNTER — Other Ambulatory Visit (HOSPITAL_COMMUNITY): Payer: Self-pay | Admitting: Interventional Radiology

## 2020-10-05 ENCOUNTER — Other Ambulatory Visit: Payer: Self-pay

## 2020-10-05 DIAGNOSIS — R34 Anuria and oliguria: Secondary | ICD-10-CM | POA: Diagnosis not present

## 2020-10-05 DIAGNOSIS — I63311 Cerebral infarction due to thrombosis of right middle cerebral artery: Principal | ICD-10-CM | POA: Diagnosis present

## 2020-10-05 DIAGNOSIS — E876 Hypokalemia: Secondary | ICD-10-CM

## 2020-10-05 DIAGNOSIS — Z515 Encounter for palliative care: Secondary | ICD-10-CM | POA: Diagnosis not present

## 2020-10-05 DIAGNOSIS — G9341 Metabolic encephalopathy: Secondary | ICD-10-CM | POA: Diagnosis present

## 2020-10-05 DIAGNOSIS — Z20822 Contact with and (suspected) exposure to covid-19: Secondary | ICD-10-CM | POA: Diagnosis present

## 2020-10-05 DIAGNOSIS — E870 Hyperosmolality and hypernatremia: Secondary | ICD-10-CM | POA: Diagnosis not present

## 2020-10-05 DIAGNOSIS — Z66 Do not resuscitate: Secondary | ICD-10-CM | POA: Diagnosis not present

## 2020-10-05 DIAGNOSIS — R7401 Elevation of levels of liver transaminase levels: Secondary | ICD-10-CM | POA: Diagnosis not present

## 2020-10-05 DIAGNOSIS — K651 Peritoneal abscess: Secondary | ICD-10-CM | POA: Diagnosis not present

## 2020-10-05 DIAGNOSIS — I480 Paroxysmal atrial fibrillation: Secondary | ICD-10-CM | POA: Diagnosis present

## 2020-10-05 DIAGNOSIS — B961 Klebsiella pneumoniae [K. pneumoniae] as the cause of diseases classified elsewhere: Secondary | ICD-10-CM | POA: Diagnosis not present

## 2020-10-05 DIAGNOSIS — R1310 Dysphagia, unspecified: Secondary | ICD-10-CM | POA: Diagnosis not present

## 2020-10-05 DIAGNOSIS — E11649 Type 2 diabetes mellitus with hypoglycemia without coma: Secondary | ICD-10-CM | POA: Diagnosis not present

## 2020-10-05 DIAGNOSIS — R0603 Acute respiratory distress: Secondary | ICD-10-CM

## 2020-10-05 DIAGNOSIS — J9601 Acute respiratory failure with hypoxia: Secondary | ICD-10-CM | POA: Diagnosis present

## 2020-10-05 DIAGNOSIS — R809 Proteinuria, unspecified: Secondary | ICD-10-CM | POA: Diagnosis present

## 2020-10-05 DIAGNOSIS — J690 Pneumonitis due to inhalation of food and vomit: Secondary | ICD-10-CM | POA: Diagnosis not present

## 2020-10-05 DIAGNOSIS — H53462 Homonymous bilateral field defects, left side: Secondary | ICD-10-CM | POA: Diagnosis present

## 2020-10-05 DIAGNOSIS — Y731 Therapeutic (nonsurgical) and rehabilitative gastroenterology and urology devices associated with adverse incidents: Secondary | ICD-10-CM | POA: Diagnosis not present

## 2020-10-05 DIAGNOSIS — I639 Cerebral infarction, unspecified: Secondary | ICD-10-CM | POA: Insufficient documentation

## 2020-10-05 DIAGNOSIS — I340 Nonrheumatic mitral (valve) insufficiency: Secondary | ICD-10-CM | POA: Diagnosis not present

## 2020-10-05 DIAGNOSIS — R198 Other specified symptoms and signs involving the digestive system and abdomen: Secondary | ICD-10-CM | POA: Diagnosis not present

## 2020-10-05 DIAGNOSIS — R54 Age-related physical debility: Secondary | ICD-10-CM | POA: Diagnosis present

## 2020-10-05 DIAGNOSIS — Z903 Acquired absence of stomach [part of]: Secondary | ICD-10-CM

## 2020-10-05 DIAGNOSIS — M109 Gout, unspecified: Secondary | ICD-10-CM | POA: Diagnosis present

## 2020-10-05 DIAGNOSIS — N200 Calculus of kidney: Secondary | ICD-10-CM | POA: Diagnosis present

## 2020-10-05 DIAGNOSIS — N401 Enlarged prostate with lower urinary tract symptoms: Secondary | ICD-10-CM | POA: Diagnosis present

## 2020-10-05 DIAGNOSIS — Z79899 Other long term (current) drug therapy: Secondary | ICD-10-CM | POA: Diagnosis not present

## 2020-10-05 DIAGNOSIS — J9811 Atelectasis: Secondary | ICD-10-CM | POA: Diagnosis present

## 2020-10-05 DIAGNOSIS — I10 Essential (primary) hypertension: Secondary | ICD-10-CM | POA: Insufficient documentation

## 2020-10-05 DIAGNOSIS — J869 Pyothorax without fistula: Secondary | ICD-10-CM | POA: Diagnosis not present

## 2020-10-05 DIAGNOSIS — K91840 Postprocedural hemorrhage and hematoma of a digestive system organ or structure following a digestive system procedure: Secondary | ICD-10-CM | POA: Diagnosis not present

## 2020-10-05 DIAGNOSIS — R579 Shock, unspecified: Secondary | ICD-10-CM | POA: Diagnosis not present

## 2020-10-05 DIAGNOSIS — I4891 Unspecified atrial fibrillation: Secondary | ICD-10-CM | POA: Diagnosis not present

## 2020-10-05 DIAGNOSIS — I671 Cerebral aneurysm, nonruptured: Secondary | ICD-10-CM | POA: Diagnosis present

## 2020-10-05 DIAGNOSIS — N281 Cyst of kidney, acquired: Secondary | ICD-10-CM | POA: Diagnosis present

## 2020-10-05 DIAGNOSIS — M5432 Sciatica, left side: Secondary | ICD-10-CM | POA: Diagnosis present

## 2020-10-05 DIAGNOSIS — I110 Hypertensive heart disease with heart failure: Secondary | ICD-10-CM | POA: Diagnosis present

## 2020-10-05 DIAGNOSIS — I63319 Cerebral infarction due to thrombosis of unspecified middle cerebral artery: Secondary | ICD-10-CM

## 2020-10-05 DIAGNOSIS — R791 Abnormal coagulation profile: Secondary | ICD-10-CM | POA: Diagnosis not present

## 2020-10-05 DIAGNOSIS — E875 Hyperkalemia: Secondary | ICD-10-CM | POA: Diagnosis not present

## 2020-10-05 DIAGNOSIS — R066 Hiccough: Secondary | ICD-10-CM | POA: Diagnosis not present

## 2020-10-05 DIAGNOSIS — J90 Pleural effusion, not elsewhere classified: Secondary | ICD-10-CM

## 2020-10-05 DIAGNOSIS — E43 Unspecified severe protein-calorie malnutrition: Secondary | ICD-10-CM | POA: Diagnosis present

## 2020-10-05 DIAGNOSIS — R000 Tachycardia, unspecified: Secondary | ICD-10-CM

## 2020-10-05 DIAGNOSIS — D62 Acute posthemorrhagic anemia: Secondary | ICD-10-CM | POA: Diagnosis not present

## 2020-10-05 DIAGNOSIS — R1313 Dysphagia, pharyngeal phase: Secondary | ICD-10-CM | POA: Diagnosis present

## 2020-10-05 DIAGNOSIS — G8194 Hemiplegia, unspecified affecting left nondominant side: Secondary | ICD-10-CM | POA: Diagnosis present

## 2020-10-05 DIAGNOSIS — Z9889 Other specified postprocedural states: Secondary | ICD-10-CM

## 2020-10-05 DIAGNOSIS — R29709 NIHSS score 9: Secondary | ICD-10-CM | POA: Diagnosis present

## 2020-10-05 DIAGNOSIS — R58 Hemorrhage, not elsewhere classified: Secondary | ICD-10-CM | POA: Diagnosis not present

## 2020-10-05 DIAGNOSIS — N151 Renal and perinephric abscess: Secondary | ICD-10-CM

## 2020-10-05 DIAGNOSIS — Z4659 Encounter for fitting and adjustment of other gastrointestinal appliance and device: Secondary | ICD-10-CM

## 2020-10-05 DIAGNOSIS — R531 Weakness: Secondary | ICD-10-CM | POA: Diagnosis present

## 2020-10-05 DIAGNOSIS — K631 Perforation of intestine (nontraumatic): Secondary | ICD-10-CM | POA: Diagnosis not present

## 2020-10-05 DIAGNOSIS — K8020 Calculus of gallbladder without cholecystitis without obstruction: Secondary | ICD-10-CM | POA: Diagnosis present

## 2020-10-05 DIAGNOSIS — R578 Other shock: Secondary | ICD-10-CM | POA: Diagnosis not present

## 2020-10-05 DIAGNOSIS — J939 Pneumothorax, unspecified: Secondary | ICD-10-CM

## 2020-10-05 DIAGNOSIS — I63131 Cerebral infarction due to embolism of right carotid artery: Secondary | ICD-10-CM

## 2020-10-05 DIAGNOSIS — E874 Mixed disorder of acid-base balance: Secondary | ICD-10-CM | POA: Diagnosis not present

## 2020-10-05 DIAGNOSIS — E1165 Type 2 diabetes mellitus with hyperglycemia: Secondary | ICD-10-CM | POA: Diagnosis not present

## 2020-10-05 DIAGNOSIS — R471 Dysarthria and anarthria: Secondary | ICD-10-CM | POA: Diagnosis present

## 2020-10-05 DIAGNOSIS — N179 Acute kidney failure, unspecified: Secondary | ICD-10-CM | POA: Diagnosis not present

## 2020-10-05 DIAGNOSIS — I611 Nontraumatic intracerebral hemorrhage in hemisphere, cortical: Secondary | ICD-10-CM | POA: Diagnosis not present

## 2020-10-05 DIAGNOSIS — R6520 Severe sepsis without septic shock: Secondary | ICD-10-CM | POA: Diagnosis not present

## 2020-10-05 DIAGNOSIS — A419 Sepsis, unspecified organism: Secondary | ICD-10-CM | POA: Diagnosis not present

## 2020-10-05 DIAGNOSIS — J942 Hemothorax: Secondary | ICD-10-CM

## 2020-10-05 DIAGNOSIS — R6521 Severe sepsis with septic shock: Secondary | ICD-10-CM | POA: Diagnosis not present

## 2020-10-05 DIAGNOSIS — K316 Fistula of stomach and duodenum: Secondary | ICD-10-CM | POA: Diagnosis not present

## 2020-10-05 DIAGNOSIS — A4159 Other Gram-negative sepsis: Secondary | ICD-10-CM | POA: Diagnosis not present

## 2020-10-05 DIAGNOSIS — J9690 Respiratory failure, unspecified, unspecified whether with hypoxia or hypercapnia: Secondary | ICD-10-CM

## 2020-10-05 DIAGNOSIS — E861 Hypovolemia: Secondary | ICD-10-CM | POA: Diagnosis not present

## 2020-10-05 DIAGNOSIS — K922 Gastrointestinal hemorrhage, unspecified: Secondary | ICD-10-CM | POA: Diagnosis not present

## 2020-10-05 DIAGNOSIS — I700 Atherosclerosis of aorta: Secondary | ICD-10-CM | POA: Diagnosis present

## 2020-10-05 DIAGNOSIS — I6601 Occlusion and stenosis of right middle cerebral artery: Secondary | ICD-10-CM | POA: Diagnosis not present

## 2020-10-05 DIAGNOSIS — R451 Restlessness and agitation: Secondary | ICD-10-CM | POA: Diagnosis not present

## 2020-10-05 DIAGNOSIS — R140 Abdominal distension (gaseous): Secondary | ICD-10-CM | POA: Diagnosis not present

## 2020-10-05 DIAGNOSIS — I469 Cardiac arrest, cause unspecified: Secondary | ICD-10-CM

## 2020-10-05 DIAGNOSIS — E785 Hyperlipidemia, unspecified: Secondary | ICD-10-CM | POA: Diagnosis not present

## 2020-10-05 DIAGNOSIS — N170 Acute kidney failure with tubular necrosis: Secondary | ICD-10-CM | POA: Diagnosis not present

## 2020-10-05 DIAGNOSIS — R29810 Facial weakness: Secondary | ICD-10-CM | POA: Diagnosis present

## 2020-10-05 DIAGNOSIS — I5033 Acute on chronic diastolic (congestive) heart failure: Secondary | ICD-10-CM | POA: Diagnosis present

## 2020-10-05 DIAGNOSIS — Z6820 Body mass index (BMI) 20.0-20.9, adult: Secondary | ICD-10-CM

## 2020-10-05 DIAGNOSIS — N400 Enlarged prostate without lower urinary tract symptoms: Secondary | ICD-10-CM | POA: Diagnosis present

## 2020-10-05 DIAGNOSIS — M7022 Olecranon bursitis, left elbow: Secondary | ICD-10-CM | POA: Diagnosis present

## 2020-10-05 DIAGNOSIS — J9621 Acute and chronic respiratory failure with hypoxia: Secondary | ICD-10-CM

## 2020-10-05 DIAGNOSIS — E119 Type 2 diabetes mellitus without complications: Secondary | ICD-10-CM | POA: Diagnosis present

## 2020-10-05 DIAGNOSIS — L03319 Cellulitis of trunk, unspecified: Secondary | ICD-10-CM | POA: Diagnosis present

## 2020-10-05 DIAGNOSIS — Z01818 Encounter for other preprocedural examination: Secondary | ICD-10-CM

## 2020-10-05 DIAGNOSIS — K661 Hemoperitoneum: Secondary | ICD-10-CM | POA: Diagnosis not present

## 2020-10-05 DIAGNOSIS — Z09 Encounter for follow-up examination after completed treatment for conditions other than malignant neoplasm: Secondary | ICD-10-CM

## 2020-10-05 DIAGNOSIS — Z452 Encounter for adjustment and management of vascular access device: Secondary | ICD-10-CM

## 2020-10-05 DIAGNOSIS — R001 Bradycardia, unspecified: Secondary | ICD-10-CM | POA: Diagnosis not present

## 2020-10-05 DIAGNOSIS — M25422 Effusion, left elbow: Secondary | ICD-10-CM

## 2020-10-05 DIAGNOSIS — R079 Chest pain, unspecified: Secondary | ICD-10-CM

## 2020-10-05 DIAGNOSIS — I6389 Other cerebral infarction: Secondary | ICD-10-CM | POA: Diagnosis not present

## 2020-10-05 DIAGNOSIS — I472 Ventricular tachycardia: Secondary | ICD-10-CM | POA: Diagnosis not present

## 2020-10-05 DIAGNOSIS — I9589 Other hypotension: Secondary | ICD-10-CM | POA: Diagnosis not present

## 2020-10-05 DIAGNOSIS — I462 Cardiac arrest due to underlying cardiac condition: Secondary | ICD-10-CM | POA: Diagnosis not present

## 2020-10-05 DIAGNOSIS — M7989 Other specified soft tissue disorders: Secondary | ICD-10-CM | POA: Diagnosis not present

## 2020-10-05 HISTORY — PX: IR INTRA CRAN STENT: IMG2345

## 2020-10-05 HISTORY — PX: RADIOLOGY WITH ANESTHESIA: SHX6223

## 2020-10-05 HISTORY — PX: IR PERCUTANEOUS ART THROMBECTOMY/INFUSION INTRACRANIAL INC DIAG ANGIO: IMG6087

## 2020-10-05 HISTORY — PX: IR CT HEAD LTD: IMG2386

## 2020-10-05 LAB — POCT I-STAT 7, (LYTES, BLD GAS, ICA,H+H)
Acid-Base Excess: 0 mmol/L (ref 0.0–2.0)
Bicarbonate: 24.4 mmol/L (ref 20.0–28.0)
Calcium, Ion: 1.12 mmol/L — ABNORMAL LOW (ref 1.15–1.40)
HCT: 43 % (ref 39.0–52.0)
Hemoglobin: 14.6 g/dL (ref 13.0–17.0)
O2 Saturation: 100 %
Patient temperature: 98.6
Potassium: 3.5 mmol/L (ref 3.5–5.1)
Sodium: 139 mmol/L (ref 135–145)
TCO2: 26 mmol/L (ref 22–32)
pCO2 arterial: 38 mmHg (ref 32.0–48.0)
pH, Arterial: 7.415 (ref 7.350–7.450)
pO2, Arterial: 205 mmHg — ABNORMAL HIGH (ref 83.0–108.0)

## 2020-10-05 LAB — DIFFERENTIAL
Abs Immature Granulocytes: 0.03 10*3/uL (ref 0.00–0.07)
Basophils Absolute: 0 10*3/uL (ref 0.0–0.1)
Basophils Relative: 0 %
Eosinophils Absolute: 0.1 10*3/uL (ref 0.0–0.5)
Eosinophils Relative: 1 %
Immature Granulocytes: 0 %
Lymphocytes Relative: 22 %
Lymphs Abs: 1.6 10*3/uL (ref 0.7–4.0)
Monocytes Absolute: 0.3 10*3/uL (ref 0.1–1.0)
Monocytes Relative: 4 %
Neutro Abs: 5.2 10*3/uL (ref 1.7–7.7)
Neutrophils Relative %: 73 %

## 2020-10-05 LAB — RESPIRATORY PANEL BY RT PCR (FLU A&B, COVID)
Influenza A by PCR: NEGATIVE
Influenza B by PCR: NEGATIVE
SARS Coronavirus 2 by RT PCR: NEGATIVE

## 2020-10-05 LAB — COMPREHENSIVE METABOLIC PANEL
ALT: 32 U/L (ref 0–44)
AST: 24 U/L (ref 15–41)
Albumin: 4.3 g/dL (ref 3.5–5.0)
Alkaline Phosphatase: 113 U/L (ref 38–126)
Anion gap: 11 (ref 5–15)
BUN: 12 mg/dL (ref 8–23)
CO2: 29 mmol/L (ref 22–32)
Calcium: 8.6 mg/dL — ABNORMAL LOW (ref 8.9–10.3)
Chloride: 102 mmol/L (ref 98–111)
Creatinine, Ser: 0.89 mg/dL (ref 0.61–1.24)
GFR, Estimated: >60 mL/min (ref >60)
Glucose, Bld: 110 mg/dL — ABNORMAL HIGH (ref 70–99)
Potassium: 4.3 mmol/L (ref 3.5–5.1)
Sodium: 142 mmol/L (ref 135–145)
Total Bilirubin: 0.9 mg/dL (ref 0.3–1.2)
Total Protein: 7.3 g/dL (ref 6.5–8.1)

## 2020-10-05 LAB — PROTIME-INR
INR: 0.9 (ref 0.8–1.2)
Prothrombin Time: 11.8 seconds (ref 11.4–15.2)

## 2020-10-05 LAB — CBC
HCT: 49.1 % (ref 39.0–52.0)
Hemoglobin: 16.5 g/dL (ref 13.0–17.0)
MCH: 30.1 pg (ref 26.0–34.0)
MCHC: 33.6 g/dL (ref 30.0–36.0)
MCV: 89.4 fL (ref 80.0–100.0)
Platelets: 291 10*3/uL (ref 150–400)
RBC: 5.49 MIL/uL (ref 4.22–5.81)
RDW: 13 % (ref 11.5–15.5)
WBC: 7.1 10*3/uL (ref 4.0–10.5)
nRBC: 0 % (ref 0.0–0.2)

## 2020-10-05 LAB — MRSA PCR SCREENING: MRSA by PCR: NEGATIVE

## 2020-10-05 LAB — APTT: aPTT: 25 seconds (ref 24–36)

## 2020-10-05 SURGERY — IR WITH ANESTHESIA
Anesthesia: General

## 2020-10-05 MED ORDER — FENTANYL CITRATE (PF) 100 MCG/2ML IJ SOLN
INTRAMUSCULAR | Status: AC
  Filled 2020-10-05: qty 2

## 2020-10-05 MED ORDER — FENTANYL CITRATE (PF) 100 MCG/2ML IJ SOLN
50.0000 ug | INTRAMUSCULAR | Status: DC | PRN
  Administered 2020-10-06: 50 ug via INTRAVENOUS

## 2020-10-05 MED ORDER — SODIUM CHLORIDE 0.9% FLUSH: 3.0000 mL | Freq: Once | INTRAVENOUS | Status: DC

## 2020-10-05 MED ORDER — ROCURONIUM BROMIDE 10 MG/ML (PF) SYRINGE: PREFILLED_SYRINGE | INTRAVENOUS | Status: DC | PRN

## 2020-10-05 MED ORDER — STROKE: EARLY STAGES OF RECOVERY BOOK
Freq: Once | Status: DC
  Filled 2020-10-05: qty 1

## 2020-10-05 MED ORDER — FENTANYL CITRATE (PF) 100 MCG/2ML IJ SOLN
INTRAMUSCULAR | Status: DC | PRN
  Administered 2020-10-05: 100 ug via INTRAVENOUS

## 2020-10-05 MED ORDER — CLEVIDIPINE BUTYRATE 0.5 MG/ML IV EMUL
0.0000 mg/h | INTRAVENOUS | Status: AC
  Administered 2020-10-06: 2 mg/h via INTRAVENOUS
  Administered 2020-10-06: 3 mg/h via INTRAVENOUS
  Administered 2020-10-06: 15 mg/h via INTRAVENOUS
  Administered 2020-10-06: 16 mg/h via INTRAVENOUS
  Filled 2020-10-05 (×7): qty 50

## 2020-10-05 MED ORDER — CHLORHEXIDINE GLUCONATE 0.12% ORAL RINSE (MEDLINE KIT)
15.0000 mL | Freq: Two times a day (BID) | OROMUCOSAL | Status: DC
  Administered 2020-10-05 – 2020-10-07 (×4): 15 mL via OROMUCOSAL

## 2020-10-05 MED ORDER — CHLORHEXIDINE GLUCONATE CLOTH 2 % EX PADS
6.0000 | MEDICATED_PAD | Freq: Every day | CUTANEOUS | Status: DC
  Administered 2020-10-05 – 2020-11-03 (×29): 6 via TOPICAL

## 2020-10-05 MED ORDER — TICAGRELOR 90 MG PO TABS
90.0000 mg | ORAL_TABLET | Freq: Two times a day (BID) | ORAL | Status: DC
  Administered 2020-10-06 – 2020-10-17 (×14): 90 mg
  Filled 2020-10-05 (×16): qty 1

## 2020-10-05 MED ORDER — CANGRELOR TETRASODIUM 50 MG IV SOLR
INTRAVENOUS | Status: AC
  Filled 2020-10-05: qty 50

## 2020-10-05 MED ORDER — CEFAZOLIN SODIUM-DEXTROSE 2-3 GM-%(50ML) IV SOLR
INTRAVENOUS | Status: DC | PRN
  Administered 2020-10-05: 2 g via INTRAVENOUS

## 2020-10-05 MED ORDER — ACETAMINOPHEN 650 MG RE SUPP: 650.0000 mg | RECTAL | Status: DC | PRN

## 2020-10-05 MED ORDER — ROCURONIUM BROMIDE 100 MG/10ML IV SOLN
INTRAVENOUS | Status: DC | PRN
  Administered 2020-10-05: 40 mg via INTRAVENOUS

## 2020-10-05 MED ORDER — IOHEXOL 300 MG/ML  SOLN
50.0000 mL | Freq: Once | INTRAMUSCULAR | Status: AC | PRN
  Administered 2020-10-05: 50 mL via INTRA_ARTERIAL

## 2020-10-05 MED ORDER — ASPIRIN 325 MG PO TABS
ORAL_TABLET | ORAL | Status: AC | PRN
  Administered 2020-10-05: 81 mg

## 2020-10-05 MED ORDER — ACETAMINOPHEN 160 MG/5ML PO SOLN
650.0000 mg | ORAL | Status: DC | PRN
  Administered 2020-10-06 – 2020-10-17 (×8): 650 mg
  Filled 2020-10-05 (×8): qty 20.3

## 2020-10-05 MED ORDER — SENNOSIDES-DOCUSATE SODIUM 8.6-50 MG PO TABS: 1.0000 | ORAL_TABLET | Freq: Every evening | ORAL | Status: DC | PRN

## 2020-10-05 MED ORDER — ONDANSETRON HCL 4 MG/2ML IJ SOLN
INTRAMUSCULAR | Status: DC | PRN
  Administered 2020-10-05: 4 mg via INTRAVENOUS

## 2020-10-05 MED ORDER — PANTOPRAZOLE SODIUM 40 MG IV SOLR
40.0000 mg | INTRAVENOUS | Status: DC
  Administered 2020-10-06 – 2020-10-07 (×2): 40 mg via INTRAVENOUS
  Filled 2020-10-05: qty 40

## 2020-10-05 MED ORDER — ASPIRIN 81 MG PO CHEW
81.0000 mg | CHEWABLE_TABLET | Freq: Every day | ORAL | Status: DC
  Administered 2020-10-06 – 2020-10-08 (×2): 81 mg
  Filled 2020-10-05 (×2): qty 1

## 2020-10-05 MED ORDER — TIROFIBAN HCL IN NACL 5-0.9 MG/100ML-% IV SOLN
INTRAVENOUS | Status: AC
  Filled 2020-10-05: qty 100

## 2020-10-05 MED ORDER — PROPOFOL 10 MG/ML IV BOLUS
INTRAVENOUS | Status: DC | PRN
  Administered 2020-10-05: 120 mg via INTRAVENOUS

## 2020-10-05 MED ORDER — IOHEXOL 300 MG/ML  SOLN
150.0000 mL | Freq: Once | INTRAMUSCULAR | Status: AC | PRN
  Administered 2020-10-05: 75 mL via INTRA_ARTERIAL

## 2020-10-05 MED ORDER — DOCUSATE SODIUM 50 MG/5ML PO LIQD
100.0000 mg | Freq: Two times a day (BID) | ORAL | Status: DC
  Filled 2020-10-05: qty 10

## 2020-10-05 MED ORDER — IOHEXOL 350 MG/ML SOLN
100.0000 mL | Freq: Once | INTRAVENOUS | Status: AC | PRN
  Administered 2020-10-05: 100 mL via INTRAVENOUS

## 2020-10-05 MED ORDER — SODIUM CHLORIDE 0.9 % IV SOLN: INTRAVENOUS | Status: DC

## 2020-10-05 MED ORDER — ORAL CARE MOUTH RINSE
15.0000 mL | OROMUCOSAL | Status: DC
  Administered 2020-10-05 – 2020-10-07 (×20): 15 mL via OROMUCOSAL

## 2020-10-05 MED ORDER — NITROGLYCERIN 1 MG/10 ML FOR IR/CATH LAB
INTRA_ARTERIAL | Status: AC
  Filled 2020-10-05: qty 10

## 2020-10-05 MED ORDER — ACETAMINOPHEN 160 MG/5ML PO SOLN: 650.0000 mg | ORAL | Status: DC | PRN

## 2020-10-05 MED ORDER — ACETAMINOPHEN 325 MG PO TABS
650.0000 mg | ORAL_TABLET | ORAL | Status: DC | PRN
  Administered 2020-10-08 – 2020-10-11 (×4): 650 mg via ORAL
  Filled 2020-10-05 (×5): qty 2

## 2020-10-05 MED ORDER — TICAGRELOR 90 MG PO TABS
ORAL_TABLET | ORAL | Status: AC
  Filled 2020-10-05: qty 2

## 2020-10-05 MED ORDER — EPTIFIBATIDE 20 MG/10ML IV SOLN
INTRAVENOUS | Status: AC
  Filled 2020-10-05: qty 10

## 2020-10-05 MED ORDER — PROPOFOL 1000 MG/100ML IV EMUL
5.0000 ug/kg/min | INTRAVENOUS | Status: DC
Start: 2020-10-05 — End: 2020-10-05

## 2020-10-05 MED ORDER — CLEVIDIPINE BUTYRATE 0.5 MG/ML IV EMUL
INTRAVENOUS | Status: AC
  Administered 2020-10-05: 8 mg/h via INTRAVENOUS
  Filled 2020-10-05: qty 50

## 2020-10-05 MED ORDER — CLEVIDIPINE BUTYRATE 0.5 MG/ML IV EMUL
INTRAVENOUS | Status: AC
  Filled 2020-10-05: qty 50

## 2020-10-05 MED ORDER — CANGRELOR BOLUS VIA INFUSION
INTRAVENOUS | Status: AC | PRN
  Administered 2020-10-05: 850.5 ug via INTRAVENOUS

## 2020-10-05 MED ORDER — SUCCINYLCHOLINE CHLORIDE 200 MG/10ML IV SOSY
PREFILLED_SYRINGE | INTRAVENOUS | Status: DC | PRN
  Administered 2020-10-05: 100 mg via INTRAVENOUS

## 2020-10-05 MED ORDER — ASPIRIN 81 MG PO CHEW
CHEWABLE_TABLET | ORAL | Status: AC
  Filled 2020-10-05: qty 1

## 2020-10-05 MED ORDER — CLOPIDOGREL BISULFATE 300 MG PO TABS
ORAL_TABLET | ORAL | Status: AC
  Filled 2020-10-05: qty 1

## 2020-10-05 MED ORDER — PHENYLEPHRINE HCL-NACL 10-0.9 MG/250ML-% IV SOLN
INTRAVENOUS | Status: DC | PRN
  Administered 2020-10-05: 25 ug/min via INTRAVENOUS

## 2020-10-05 MED ORDER — LACTATED RINGERS IV SOLN: INTRAVENOUS | Status: DC | PRN

## 2020-10-05 MED ORDER — SODIUM CHLORIDE (PF) 0.9 % IJ SOLN
INTRAVENOUS | Status: AC | PRN
  Administered 2020-10-05: 25 ug via INTRA_ARTERIAL

## 2020-10-05 MED ORDER — EPHEDRINE SULFATE 50 MG/ML IJ SOLN
INTRAMUSCULAR | Status: DC | PRN
  Administered 2020-10-05: 5 mg via INTRAVENOUS

## 2020-10-05 MED ORDER — FENTANYL CITRATE (PF) 100 MCG/2ML IJ SOLN
50.0000 ug | INTRAMUSCULAR | Status: DC | PRN
  Administered 2020-10-05 – 2020-10-06 (×7): 100 ug via INTRAVENOUS
  Filled 2020-10-05 (×2): qty 2
  Filled 2020-10-05: qty 4
  Filled 2020-10-05 (×4): qty 2

## 2020-10-05 MED ORDER — ACETAMINOPHEN 650 MG RE SUPP
650.0000 mg | RECTAL | Status: DC | PRN
  Administered 2020-10-07 – 2020-10-12 (×2): 650 mg via RECTAL
  Filled 2020-10-05 (×2): qty 1

## 2020-10-05 MED ORDER — POLYETHYLENE GLYCOL 3350 17 G PO PACK
17.0000 g | PACK | Freq: Every day | ORAL | Status: DC
  Administered 2020-10-06: 17 g
  Filled 2020-10-05: qty 1

## 2020-10-05 MED ORDER — PROPOFOL 1000 MG/100ML IV EMUL
0.0000 ug/kg/min | INTRAVENOUS | Status: DC
  Administered 2020-10-05: 20 ug/kg/min via INTRAVENOUS
  Administered 2020-10-07: 35 ug/kg/min via INTRAVENOUS
  Filled 2020-10-05 (×5): qty 100

## 2020-10-05 MED ORDER — POLYETHYLENE GLYCOL 3350 17 G PO PACK: 17.0000 g | PACK | Freq: Every day | ORAL | Status: DC

## 2020-10-05 MED ORDER — VERAPAMIL HCL 2.5 MG/ML IV SOLN
INTRAVENOUS | Status: AC
  Filled 2020-10-05: qty 2

## 2020-10-05 MED ORDER — LIDOCAINE HCL (PF) 1 % IJ SOLN
INTRAMUSCULAR | Status: AC | PRN
  Administered 2020-10-05: 5 mL

## 2020-10-05 MED ORDER — LACTATED RINGERS IV SOLN: INTRAVENOUS | Status: DC

## 2020-10-05 MED ORDER — ACETAMINOPHEN 325 MG PO TABS: 650.0000 mg | ORAL_TABLET | ORAL | Status: DC | PRN

## 2020-10-05 MED ORDER — CEFAZOLIN SODIUM-DEXTROSE 2-4 GM/100ML-% IV SOLN
INTRAVENOUS | Status: AC
  Filled 2020-10-05: qty 100

## 2020-10-05 MED ORDER — LABETALOL HCL 5 MG/ML IV SOLN
INTRAVENOUS | Status: DC | PRN
  Administered 2020-10-05 (×2): 10 mg via INTRAVENOUS

## 2020-10-05 MED ORDER — ASPIRIN 81 MG PO CHEW
81.0000 mg | CHEWABLE_TABLET | Freq: Every day | ORAL | Status: DC
  Administered 2020-10-09 – 2020-10-11 (×3): 81 mg via ORAL
  Filled 2020-10-05 (×3): qty 1

## 2020-10-05 MED ORDER — SODIUM CHLORIDE 0.9 % IV SOLN
INTRAVENOUS | Status: AC | PRN
  Administered 2020-10-05: 2 ug/kg/min via INTRAVENOUS

## 2020-10-05 MED ORDER — TICAGRELOR 60 MG PO TABS
ORAL_TABLET | ORAL | Status: AC | PRN
  Administered 2020-10-05: 180 mg

## 2020-10-05 MED ORDER — TICAGRELOR 90 MG PO TABS
90.0000 mg | ORAL_TABLET | Freq: Two times a day (BID) | ORAL | Status: DC
  Administered 2020-10-08 – 2020-10-11 (×6): 90 mg via ORAL
  Filled 2020-10-05 (×7): qty 1

## 2020-10-05 NOTE — ED Provider Notes (Signed)
[facility] Emergency Department Provider Note    First MD Initiated Contact with Patient 10/12/2020 1359     (approximate)  I have reviewed the triage vital signs and the nursing notes.   HISTORY  Chief Complaint Extremity Weakness  History limited 2/2 language barrier History obtained using interpreter and family  HPI Mark May is a 64 y.o. male who presents to the ER for evaluation of weakness unable to move his left upper extremity.  Patient went to bed feeling normal and woke up around 3 AM this morning to use the bathroom was unable to move his left upper extremity.  No report of any chest pain.  No history of stroke.  Does have a remote history of smoking.  Is not on any anticoagulation.  Denies any shortness of breath.    Past Medical History:  Diagnosis Date  . Gout   . Hypertension    Family History  Problem Relation Age of Onset  . Diabetes Mother   . Diabetes Father    Past Surgical History:  Procedure Laterality Date  . PARTIAL GASTRECTOMY  1990   done in [place] for PUD  . PROSTATE BIOPSY  ~ 2005   negative for cancer (done in [place])   Patient Active Problem List   Diagnosis Date Noted  . Mild hyperlipidemia 03/10/2018  . Benign prostatic hyperplasia with urinary frequency 03/09/2018  . Chronic left lumbar radiculopathy 02/17/2018  . Sciatica of left side 10/22/2016  . Controlled gout 06/26/2015  . Benign hypertension 06/26/2015      Prior to Admission medications   Medication Sig Start Date End Date Taking? Authorizing Provider  allopurinol (ZYLOPRIM) 100 MG tablet Take 3 tablets (300 mg total) by mouth daily. 02/17/18   Reubin Milan, MD  amLODipine (NORVASC) 10 MG tablet Take 1 tablet (10 mg total) by mouth daily. 02/17/18   Reubin Milan, MD  meclizine (ANTIVERT) 25 MG tablet Take 1 tablet (25 mg total) by mouth 3 (three) times daily as needed for dizziness. 03/11/20   Lutricia Feil, PA-C  meloxicam (MOBIC) 15 MG  tablet Take 1 tablet (15 mg total) by mouth daily. 03/09/18   Reubin Milan, MD  tamsulosin (FLOMAX) 0.4 MG CAPS capsule Take 1 capsule (0.4 mg total) by mouth daily after supper. 03/09/18   Reubin Milan, MD  tizanidine (ZANAFLEX) 2 MG capsule Take 2 mg by mouth 3 (three) times daily.    [provider]    Allergies Patient has no known allergies.    Social History Social History   Tobacco Use  . Smoking status: Never Smoker  . Smokeless tobacco: Never Used  Vaping Use  . Vaping Use: Never used  Substance Use Topics  . Alcohol use: No    Alcohol/week: 0.0 standard drinks  . Drug use: No    Review of Systems Patient denies headaches, rhinorrhea, blurry vision, numbness, shortness of breath, chest pain, edema, cough, abdominal pain, nausea, vomiting, diarrhea, dysuria, fevers, rashes or hallucinations unless otherwise stated above in HPI. ____________________________________________   PHYSICAL EXAM:  VITAL SIGNS: Vitals:   10/09/2020 1446 10/21/2020 1505  BP: (!) 187/97 (!) 194/107  Pulse: 80 76  Resp: 14 20  Temp:  98.1 F (36.7 C)  SpO2: 98% 98%    Constitutional: Alert.  Eyes: Conjunctivae are normal.  Head: Atraumatic. Nose: No congestion/rhinnorhea. Mouth/Throat: Mucous membranes are moist.   Neck: No stridor. Painless ROM.  Cardiovascular: Normal rate, regular rhythm. Grossly  normal heart sounds.  Good peripheral circulation. Respiratory: Normal respiratory effort.  No retractions. Lungs CTAB. Gastrointestinal: Soft and nontender. No distention. No abdominal bruits. No CVA tenderness. Genitourinary:  Musculoskeletal: No lower extremity tenderness nor edema.  No joint effusions. Neurologic: Dense left hemiparesis and left-sided neglect Glatt.  Unable to cross midline with vision.  She is also have visual field cut left sided bilateral hemianopsia.  Does have some mild strength preserved in the left lower extremity. Skin:  Skin is warm, dry and  intact. No rash noted. Psychiatric:calm, cooperative  ____________________________________________   LABS (all labs ordered are listed, but only abnormal results are displayed)  Results for orders placed or performed during the hospital encounter of 10/26/2020 (from the past 24 hour(s))  Protime-INR     Status: None   Collection Time: 10/04/2020 12:46 PM  Result Value Ref Range   Prothrombin Time 11.8 11.4 - 15.2 seconds   INR 0.9 0.8 - 1.2  APTT     Status: None   Collection Time: 10/03/2020 12:46 PM  Result Value Ref Range   aPTT 25 24 - 36 seconds  CBC     Status: None   Collection Time: 10/21/2020 12:46 PM  Result Value Ref Range   WBC 7.1 4.0 - 10.5 K/uL   RBC 5.49 4.22 - 5.81 MIL/uL   Hemoglobin 16.5 13.0 - 17.0 g/dL   HCT 86.7 39 - 52 %   MCV 89.4 80.0 - 100.0 fL   MCH 30.1 26.0 - 34.0 pg   MCHC 33.6 30.0 - 36.0 g/dL   RDW 67.2 09.4 - 70.9 %   Platelets 291 150 - 400 K/uL   nRBC 0.0 0.0 - 0.2 %  Differential     Status: None   Collection Time: 10/08/2020 12:46 PM  Result Value Ref Range   Neutrophils Relative % 73 %   Neutro Abs 5.2 1.7 - 7.7 K/uL   Lymphocytes Relative 22 %   Lymphs Abs 1.6 0.7 - 4.0 K/uL   Monocytes Relative 4 %   Monocytes Absolute 0.3 0.1 - 1.0 K/uL   Eosinophils Relative 1 %   Eosinophils Absolute 0.1 0.0 - 0.5 K/uL   Basophils Relative 0 %   Basophils Absolute 0.0 0.0 - 0.1 K/uL   Immature Granulocytes 0 %   Abs Immature Granulocytes 0.03 0.00 - 0.07 K/uL  Comprehensive metabolic panel     Status: Abnormal   Collection Time: 10/21/2020 12:46 PM  Result Value Ref Range   Sodium 142 135 - 145 mmol/L   Potassium 4.3 3.5 - 5.1 mmol/L   Chloride 102 98 - 111 mmol/L   CO2 29 22 - 32 mmol/L   Glucose, Bld 110 (H) 70 - 99 mg/dL   BUN 12 8 - 23 mg/dL   Creatinine, Ser 6.28 0.61 - 1.24 mg/dL   Calcium 8.6 (L) 8.9 - 10.3 mg/dL   Total Protein 7.3 6.5 - 8.1 g/dL   Albumin 4.3 3.5 - 5.0 g/dL   AST 24 15 - 41 U/L   ALT 32 0 - 44 U/L   Alkaline  Phosphatase 113 38 - 126 U/L   Total Bilirubin 0.9 0.3 - 1.2 mg/dL   GFR, Estimated >36 >62 mL/min   Anion gap 11 5 - 15   ____________________________________________  EKG My review and personal interpretation at Time: 12:31   Indication: weakness  Rate: 70  Rhythm: sinus Axis: normal Other: nonspecific st abn, no stemi ____________________________________________  RADIOLOGY  I personally reviewed all  radiographic images ordered to evaluate for the above acute complaints and reviewed radiology reports and findings.  These findings were personally discussed with the patient.  Please see medical record for radiology report.  ____________________________________________   PROCEDURES  Procedure(s) performed:  .Critical Care Performed by: Willy Eddyobinson, Brenner Visconti, MD Authorized by: Willy Eddyobinson, Carolyn Sylvia, MD   Critical care provider statement:    Critical care time (minutes):  35   Critical care time was exclusive of:  Separately billable procedures and treating other patients   Critical care was necessary to treat or prevent imminent or life-threatening deterioration of the following conditions:  CNS failure or compromise   Critical care was time spent personally by me on the following activities:  Development of treatment plan with patient or surrogate, discussions with consultants, evaluation of patient's response to treatment, examination of patient, obtaining history from patient or surrogate, ordering and performing treatments and interventions, ordering and review of laboratory studies, ordering and review of radiographic studies, pulse oximetry, re-evaluation of patient's condition and review of old charts    Due to difficulty with obtaining IV access, a 18G peripheral IV catheter was inserted using US guidance into the RUE.  The site was prepped with chlorhexidine and allowed to dry.  The patient tolerated the procedure without any complications.    Critical Care performed:  yes ____________________________________________   INITIAL IMPRESSION / ASSESSMENT AND PLAN / ED COURSE  Pertinent labs & imaging results that were available during my care of the patient were reviewed by me and considered in my medical decision making (see chart for details).   DDX: cva, tia, hypoglycemia, dehydration, electrolyte abnormality, dissection, sepsis   Bary CastillaLi Fei Scheerer is a 64 y.o. who presents to the ED with presentation as described above.  Immediately upon my arrival realized patient had dense hemiparesis concerning for LVO as he was Zenaida NieceVan positive.  Immediately consulted neurology and called code stroke.  He is outside of the window for systemic TPA but may be an interventional candidate.  The patient will be placed on continuous pulse oximetry and telemetry for monitoring.  Laboratory evaluation will be sent to evaluate for the above complaints.     Clinical Course as of Oct 05 1516  Caleen EssexFri Oct 05, 2020  1431 Patient eval by neurology has been taken emergently to CTA due to concern for LVO.   [PR]  1455 Patient with evidence of LVO and is a interventional candidate.  Dr. Jerrell BelfastAurora has already communicated with interventional radiology who is excepted patient [facility].  Patient will go via emergency traffic EMS.  Allowing permissive hypertensive at this time. protecting his airway. Family updated at bedside.  Agreeable to plan for emergent transfer.  [PR]    Clinical Course User Index [PR] Willy Eddyobinson, Raylea Adcox, MD    The patient was evaluated in Emergency Department today for the symptoms described in the history of present illness. He/she was evaluated in the context of the global COVID-19 pandemic, which necessitated consideration that the patient might be at risk for infection with the SARS-CoV-2 virus that causes COVID-19. Institutional protocols and algorithms that pertain to the evaluation of patients at risk for COVID-19 are in a state of rapid change  based on information released by regulatory bodies including the CDC and federal and state organizations. These policies and algorithms were followed during the patient's care in the ED.  As part of my medical decision making, I reviewed the following data within the electronic MEDICAL RECORD NUMBER Nursing  notes reviewed and incorporated, Labs reviewed, notes from prior ED visits and [place] Controlled Substance Database   ____________________________________________   FINAL CLINICAL IMPRESSION(S) / ED DIAGNOSES  Final diagnoses:  Cerebrovascular accident (CVA), unspecified mechanism (HCC)      NEW MEDICATIONS STARTED DURING THIS VISIT:  Discharge Medication List as of Oct 09, 2020  3:11 PM       Note:  This document was prepared using Dragon voice recognition software and may include unintentional dictation errors.    Willy Eddy, MD 10/09/20 [phone]

## 2020-10-05 NOTE — ED Notes (Signed)
Son at bedside reports pt woke up with left sided numbness that started when he woke up at 0400. Felt ok when went to bed at 2300 last night. Pt oriented.  Unable to move left arm. Left side neglect. Has sensation in left arm .

## 2020-10-05 NOTE — Consult Note (Signed)
Neurology Consultation  Reason for Consult: Code stroke for left-sided numbness and weakness Referring Physician: Dr. Willy Eddy, EDP  CC: Left-sided weakness  History is obtained from: Patient, chart, son Micholas Drumwright  HPI: Mark May is a 64 y.o. male past medical history of hypertension, peptic ulcer disease, gout, presented to the emergency room for sudden onset of left-sided numbness and weakness. Patient went to bed normal last night at 10 PM, and this morning upon waking up was noted to have left-sided weakness.  He was brought into the hospital his son for evaluation.  Initial triage note says he only had numbness on the left side but also was not able to carry things with his left arm.  A code stroke was initially not activated at triage.  Brought into the ED for examination.  ED provider examined the patient and noticed right gaze preference and was concerned for an LVO.  He called me over the phone to discuss and I recommended that we activated a LVO positive code stroke, which was then activated. Patient has not been sick prior to this presentation with fevers or chills.  No cough.  No shortness of breath.  No chest pain nausea vomiting.  No abdominal pain. Patient has not had a stroke in the past.  LKW: 10 PM on 10/04/2020 tpa given?: no, outside the window Premorbid modified Rankin scale (mRS): 0  ROS: ROS was performed and is negative except as noted in the HPI.  Past Medical History:  Diagnosis Date  . Gout   . Hypertension     Family History  Problem Relation Age of Onset  . Diabetes Mother   . Diabetes Father     Social History:   reports that he has never smoked. He has never used smokeless tobacco. He reports that he does not drink alcohol and does not use drugs.  Medications  Current Facility-Administered Medications:  .  sodium chloride flush (NS) 0.9 % injection 3 mL, 3 mL, Intravenous, Once, Willy Eddy, MD  Current Outpatient Medications:   .  allopurinol (ZYLOPRIM) 100 MG tablet, Take 3 tablets (300 mg total) by mouth daily., Disp: 90 tablet, Rfl: 3 .  amLODipine (NORVASC) 10 MG tablet, Take 1 tablet (10 mg total) by mouth daily., Disp: 30 tablet, Rfl: 5 .  meclizine (ANTIVERT) 25 MG tablet, Take 1 tablet (25 mg total) by mouth 3 (three) times daily as needed for dizziness., Disp: 30 tablet, Rfl: 0 .  meloxicam (MOBIC) 15 MG tablet, Take 1 tablet (15 mg total) by mouth daily., Disp: 30 tablet, Rfl: 5 .  tamsulosin (FLOMAX) 0.4 MG CAPS capsule, Take 1 capsule (0.4 mg total) by mouth daily after supper., Disp: 30 capsule, Rfl: 5 .  tizanidine (ZANAFLEX) 2 MG capsule, Take 2 mg by mouth 3 (three) times daily., Disp: , Rfl:    Exam: Current vital signs: BP (!) 194/107 (BP Location: Right Arm)   Pulse 76   Temp 98.1 F (36.7 C) (Oral)   Resp 20   Ht 5\' 7"  (1.702 m)   Wt 56.7 kg   SpO2 98%   BMI 19.58 kg/m  Vital signs in last 24 hours: Temp:  [97.5 F (36.4 C)-98.1 F (36.7 C)] 98.1 F (36.7 C) (11/05 1505) Pulse Rate:  [66-80] 76 (11/05 1505) Resp:  [14-20] 20 (11/05 1505) BP: (187-194)/(95-107) 194/107 (11/05 1505) SpO2:  [98 %] 98 % (11/05 1505) Weight:  [56.7 kg] 56.7 kg (11/05 1215) General: Awake alert in no distress HEENT:  Normocephalic atraumatic Lungs: Clear Cardiovascular: Regular rhythm Abdomen soft nondistended nontender Extremities warm well perfused Neurological exam Awake alert oriented x3-some language barrier as he only speaks [place] but is able to speak some [place]. Speech is mildly dysarthric. No evidence of aphasia Naming repetition comprehension intact. Cranial: Pupils are equal round reactive to light, he has rightward gaze preference, he is able to look to the left but prefers looking to the right he is able to cross the midline and look all the way to the right upon coaching, he does have a left homonymous hemianopsia, subtle left facial weakness, facial sensation intact, tongue  and palate midline. Motor exam: Initially flaccid lower extremity followed by some strength-at least a 2/5 in the left upper extremity.  Left lower extremity is a 4/5.  Right upper and lower extremity full-strength. Sensory exam: Diminished on the left.  Extinction to double simultaneous stimulation.  Also had extinction to visual double times in the stimulation Coordination: No obvious dysmetria on the right-unable to reliably perform on the left. Gait testing deferred  NIHSS 1a Level of Conscious.: 0 1b LOC Questions: 0 1c LOC Commands: 0 2 Best Gaze: 1 3 Visual: 2 4 Facial Palsy: 1 5a Motor Arm - left: 3 5b Motor Arm - Right: 0 6a Motor Leg - Left: 1 6b Motor Leg - Right: 0 7 Limb Ataxia: 0 8 Sensory: 2 9 Best Language: 0 10 Dysarthria: 1 11 Extinct. and Inatten.: 2 TOTAL: 13  Labs I have reviewed labs in epic and the results pertinent to this consultation are:  CBC    Component Value Date/Time   WBC 7.1 10/04/2020 1246   RBC 5.49 10/20/2020 1246   HGB 16.5 10/26/2020 1246   HGB 15.9 03/09/2018 1620   HCT 49.1 10/04/2020 1246   HCT 46.9 03/09/2018 1620   PLT 291 10/15/2020 1246   PLT 331 03/09/2018 1620   MCV 89.4 10/08/2020 1246   MCV 90 03/09/2018 1620   MCV 80 01/06/2015 1448   MCH 30.1 10/04/2020 1246   MCHC 33.6 10/13/2020 1246   RDW 13.0 10/02/2020 1246   RDW 14.7 03/09/2018 1620   RDW 17.1 (H) 01/06/2015 1448   LYMPHSABS 1.6 10/17/2020 1246   LYMPHSABS 1.9 03/09/2018 1620   LYMPHSABS 0.9 (L) 01/06/2015 1448   MONOABS 0.3 10/23/2020 1246   MONOABS 0.5 01/06/2015 1448   EOSABS 0.1 10/16/2020 1246   EOSABS 0.1 03/09/2018 1620   EOSABS 0.0 01/06/2015 1448   BASOSABS 0.0 10/10/2020 1246   BASOSABS 0.0 03/09/2018 1620   BASOSABS 0.1 01/06/2015 1448    CMP     Component Value Date/Time   NA 142 10/26/2020 1246   NA 140 03/09/2018 1620   NA 136 01/06/2015 1448   K 4.3 10/11/2020 1246   K 3.6 01/06/2015 1448   CL 102 10/11/2020 1246   CL 99  01/06/2015 1448   CO2 29 10/09/2020 1246   CO2 28 01/06/2015 1448   GLUCOSE 110 (H) 10/29/2020 1246   GLUCOSE 154 (H) 01/06/2015 1448   BUN 12 10/11/2020 1246   BUN 12 03/09/2018 1620   BUN 12 01/06/2015 1448   CREATININE 0.89 10/29/2020 1246   CREATININE 0.88 01/06/2015 1448   CALCIUM 8.6 (L) 10/09/2020 1246   CALCIUM 8.7 01/06/2015 1448   PROT 7.3 10/22/2020 1246   PROT 7.4 03/09/2018 1620   PROT 8.0 01/06/2015 1448   ALBUMIN 4.3 10/29/2020 1246   ALBUMIN 4.8 03/09/2018 1620   ALBUMIN 4.2 01/06/2015 1448  AST 24 10/21/2020 1246   AST 32 01/06/2015 1448   ALT 32 10/16/2020 1246   ALT 44 01/06/2015 1448   ALKPHOS 113 10/20/2020 1246   ALKPHOS 123 (H) 01/06/2015 1448   BILITOT 0.9 10/03/2020 1246   BILITOT 0.6 03/09/2018 1620   BILITOT 0.3 01/06/2015 1448   GFRNONAA >60 10/26/2020 1246   GFRNONAA >60 01/06/2015 1448   GFRAA 113 03/09/2018 1620   GFRAA >60 01/06/2015 1448    Lipid Panel     Component Value Date/Time   CHOL 216 (H) 03/09/2018 1620   TRIG 103 03/09/2018 1620   HDL 59 03/09/2018 1620   CHOLHDL 3.7 03/09/2018 1620   LDLCALC 136 (H) 03/09/2018 1620     Imaging I have reviewed the images obtained:  CT-scan of the brain-no acute changes. CTA head and neck and CT perfusion with a severe distal right M1 stenosis or subocclusive thrombus.  Right MCA 20 cc core with mismatch volume of 131 cc.  4 mm right supraclinoid ICA aneurysm.  Aortic atherosclerosis.  Assessment: 64 year old man with a modified Rankin score of 0, above past medical history, presenting with sudden onset of left-sided numbness weakness as well as left-sided neglect.  Symptoms consistent with a right hemispheric stroke of large vessel etiology due to neglect, hemianopsia and gaze preference. CTA confirmed a severe distal right M1 stenosis/subocclusive thrombus.  CT perfusion shows a large area at risk with a small core of 20 cc. Case was discussed on a three-way call with interventionalists  Dr. Corliss Skains and [facility] neuro hospitalist Dr. Celestia Khat agreed with intervention and excepting the patient to the [facility] for emergent endovascular thrombectomy. Consent was obtained over the phone by Dr. Corliss Skains, with Dr.Bhagat and [facility] on the three-way call.  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 Both neurologist and neuro interventionalist on board and aware. As soon as the decision was made to take him for thrombectomy, [facility] was called as that was the fasted mode of transport available at the time. Further care per neuro interventional radiology and stroke neurology team. He will require a full stroke work-up including 2D echocardiogram, labs to include A1c and lipid panel along with frequent neurochecks, PT OT and speech therapy rehab assessments.  I had a Clinical biochemist with EMS.  I instructed them that the blood pressure goal is permissive hypertension-only to treat on a as needed basis if the systolic blood pressures over 542.  Plan discussed with the son at bedside. Case was discussed with Dr. Roxan Hockey in the ER at [facility]. Case was discussed over the phone via [facility] with Dr. Corliss Skains and Dr.Bhagat. Dr. Iver Nestle kindly accepted the patient to the neurology stroke service at [facility].  -- Milon Dikes, MD [facility] Pager: [phone]  Delays in the process: Activation of code stroke from the triage-was done after EDP assessment.  Obtaining IV access for CT perfusion caused significant amount of delay.  Some delay in family making a decision about the procedure.  CRITICAL CARE ATTESTATION Performed by: Milon Dikes, MD Total critical care time: 48 minutes Critical care time was exclusive of separately billable procedures and treating other patients and/or  supervising APPs/Residents/Students Critical care was necessary to treat or prevent imminent or life-threatening deterioration due to acute ischemic stroke, decision for EVT transfer to [facility]  This patient is critically ill and at significant risk for neurological worsening and/or death  and care requires constant monitoring. Critical care was time spent personally by me on the following activities: development of treatment plan with patient and/or surrogate as well as nursing, discussions with consultants, evaluation of patient's response to treatment, examination of patient, obtaining history from patient or surrogate, ordering and performing treatments and interventions, ordering and review of laboratory studies, ordering and review of radiographic studies, pulse oximetry, re-evaluation of patient's condition, participation in multidisciplinary rounds and medical decision making of high complexity in the care of this patient.

## 2020-10-05 NOTE — ED Notes (Signed)
Dr Wilford Corner at bedside discussing transfer.

## 2020-10-05 NOTE — Sedation Documentation (Signed)
Upon exiting the IR suite at 1740 Dr Corliss Skains gave me a verbal order for head ct wo contrast. I notified [place] CT Tech and she advised me to come to room 3. I called RT charge at [phone] and asked them to bring a ventilator to ct 3. After the pt was switched from bag to ventilator we proceeded to move the pt to the ct table safely and perform the scan. Pt vitals remained stable throughout these transitions.  After the ct scan we moved the patient back to the stretcher safely and transported to 4N with RT, CRNA, Anesthesia tech and RN.  When we reached room 4N22 I gave report to the ICU RN. He acknowledged and did not have any further questions.  At the time of report 1815 vitals stable, right groin unremarkable-25F sheath in place with dressing CDI. Pulses palpable bilaterally. Unable to assess NIH due to pt being intubated and sedated.

## 2020-10-05 NOTE — Procedures (Signed)
S/P RT Common carotid arteriogram RT CFA approach. S/P complete revascularization of near complete occlusion of RT MCA M 1 seg with  1 pass with tiger 21 retriever and aspiration achieving a TICI 3 revascularization .placement of rescue stent m4 mm x 24 mm Atlas for reocclusion with restoration of TICI 3 .  Sheath left in situ . Distal pulses all present. Patient left intubated. Loaded with 81 aspirin and 180 mg of brilinta po and bolused with cangrelor with a slow 4 hr infusion. CT brain to be obtained at 4 hrs . S.Wauneta Silveria MD

## 2020-10-05 NOTE — ED Notes (Signed)
Neuro at bedside.

## 2020-10-05 NOTE — ED Notes (Signed)
Pt to CT

## 2020-10-05 NOTE — Anesthesia Procedure Notes (Signed)
Procedure Name: Intubation Date/Time: 10/24/2020 3:50 PM Performed by: Babs Bertin, CRNA Pre-anesthesia Checklist: Patient identified, Emergency Drugs available, Suction available and Patient being monitored Patient Re-evaluated:Patient Re-evaluated prior to induction Oxygen Delivery Method: Ambu bag Preoxygenation: Pre-oxygenation with 100% oxygen Induction Type: IV induction, Rapid sequence and Cricoid Pressure applied Laryngoscope Size: Mac and 3 Grade View: Grade I Tube type: Oral Tube size: 7.0 mm Number of attempts: 1 Airway Equipment and Method: Stylet and Oral airway Placement Confirmation: ETT inserted through vocal cords under direct vision,  positive ETCO2 and breath sounds checked- equal and bilateral Secured at: 22 cm Tube secured with: Tape Dental Injury: Teeth and Oropharynx as per pre-operative assessment

## 2020-10-05 NOTE — Transfer of Care (Signed)
Immediate Anesthesia Transfer of Care Note  Patient: Mark May  Procedure(s) Performed: IR WITH ANESTHESIA (N/A )  Patient Location: ICU  Anesthesia Type:General  Level of Consciousness: Patient remains intubated per anesthesia plan  Airway & Oxygen Therapy: Patient remains intubated per anesthesia plan and Patient placed on Ventilator (see vital sign flow sheet for setting)  Post-op Assessment: Report given to RN and Post -op Vital signs reviewed and stable  Post vital signs: Reviewed and stable  Last Vitals:  Vitals Value Taken Time  BP    Temp    Pulse 67 10/25/2020 1818  Resp 20 10/12/2020 1818  SpO2 100 % 10/26/2020 1818  Vitals shown include unvalidated device data.  Last Pain: There were no vitals filed for this visit.       Complications: No complications documented.

## 2020-10-05 NOTE — Consult Note (Signed)
NAME:  Mark May, MRN:  [id number], DOB:  20-Dec-1955, LOS: 0 ADMISSION DATE:  10-27-20, CONSULTATION DATE: October 27, 2020 REFERRING MD: Dr. Curtis May, CHIEF COMPLAINT: Left-sided weakness  Brief History   64 year old male with hypertension and hyperlipidemia who presented with left-sided weakness, noted to have right MCA occlusion status post thrombectomy and stent placement.  Postprocedure CT showed hemorrhagic conversion/contrast extravasation.  History of present illness   64 year old male with hypertension who presented with left-sided weakness, numbness which started this morning when he woke up.  Patient was last well-known around 10 PM last night when he went to bed this morning he woke up with left-sided weakness.  His son brought him to the emergency department.  On CT head showed no acute intracranial hemorrhage, CT angiogram of head and neck showed right M1 1 occlusion.  Patient did not receive TPA because of out of window, he underwent IR guided thrombectomy and stent placement of right MCA artery.  He was loaded with aspirin and Brilinta afterwards. Post procedure CT head showed right frontotemporal hemorrhagic conversion of stroke versus contrast extravasation  Past Medical History  Hypertension Peptic ulcer disease Gout  Significant Hospital Events   Mechanical thrombectomy of right MCA and stent 11/5  Consults:  IR Neurology PCCM  Procedures:    Significant Diagnostic Tests:  11/5 CT head: No acute abnormality. Minimal diffuse cerebral and cerebellar atrophy. Mild chronic small vessel white matter ischemic changes in both cerebral hemispheres  11/5 CTA head and neck: Severe distal right M1 stenosis or subocclusive thrombus, Right MCA infarct with extensive penumbra, 4 mm right supraclinoid ICA aneurysm. Widely patent cervical carotid and vertebral arteries.  11/5 postprocedure CT head: Hypodensity in the right lateral temporal lobe now shows mild hemorrhage or  contrast enhancement. This is most consistent with acute infarct. There has been interval stenting of the right middle cerebral artery.   Micro Data:    Antimicrobials:    Interim history/subjective:    Objective   Pulse 67, resp. rate 18, SpO2 100 %.    Vent Mode: PRVC FiO2 (%):  [100 %] 100 % Set Rate:  [18 bmp] 18 bmp Vt Set:  [450 mL] 450 mL PEEP:  [5 cmH20] 5 cmH20 Plateau Pressure:  [12 cmH20] 12 cmH20   Intake/Output Summary (Last 24 hours) Mark May Oct 27, 2020 1842 Last data filed Mark May Oct 27, 2020 1725 Gross per 24 hour  Intake 50 ml  Output 350 ml  Net -300 ml   There were no vitals filed for this visit.  Examination:   Physical exam: General: Acutely ill-appearing male, orally intubated HEAENT: Mark May/Mark May, eyes anicteric.  ETT Neuro: Sedated, not following commands.  Eyes are closed.  Pupils 2 mm bilateral reactive to light.  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 and right MCA stent placement by IR Possible hemorrhagic conversion of right MCA stroke versus contrast extravasation Hypertension Hyperlipidemia Acute respiratory insufficiency post procedure  Continue neuro check every hour Minimize sedation for neurological exam Patient was loaded with aspirin and Brilinta in the IR suite Continue secondary stroke prophylaxis MRI brain and echocardiogram A1c and lipid panel Repeat CT scan in 4 hours per IR as patient has already shown  right frontotemporal hemorrhage versus contrast extravasation post procedure SBP goal 120-140 Continue Plavix Cleviprex as needed Continue statin Continue full mechanical ventilatory support, will try to extubate this  patient in the morning after femoral sheath is removed Continue propofol and fentanyl with RASS goal of -1 to -2 Best practice:  Diet: NPO Pain/Anxiety/Delirium protocol (if indicated): Propofol and fentanyl VAP protocol (if indicated): Yes DVT prophylaxis: SCD GI prophylaxis: Pepcid Glucose control: SSI Mobility: Bedrest Code Status: Full code Family Communication: Per primary team Disposition: ICU  Labs   CBC: Recent Labs  Lab 10/25/2020 1246  WBC 7.1  NEUTROABS 5.2  HGB 16.5  HCT 49.1  MCV 89.4  PLT 291    Basic Metabolic Panel: Recent Labs  Lab 10/20/2020 1246  NA 142  K 4.3  CL 102  CO2 29  GLUCOSE 110*  BUN 12  CREATININE 0.89  CALCIUM 8.6*   GFR: Estimated Creatinine Clearance: 66.4 mL/min (by C-G formula based on SCr of 0.89 mg/dL). Recent Labs  Lab 10/06/2020 1246  WBC 7.1    Liver Function Tests: Recent Labs  Lab 10/02/2020 1246  AST 24  ALT 32  ALKPHOS 113  BILITOT 0.9  PROT 7.3  ALBUMIN 4.3   No results for input(s): LIPASE, AMYLASE in the last 168 hours. No results for input(s): AMMONIA in the last 168 hours.  ABG No results found for: PHART, PCO2ART, PO2ART, HCO3, TCO2, ACIDBASEDEF, O2SAT   Coagulation Profile: Recent Labs  Lab 10/17/2020 1246  INR 0.9    Cardiac Enzymes: No results for input(s): CKTOTAL, CKMB, CKMBINDEX, TROPONINI in the last 168 hours.  HbA1C: No results found for: HGBA1C  CBG: No results for input(s): GLUCAP in the last 168 hours.  Review of Systems:   Unable to perform review of system as patient is deeply sedated Mark May this time  Past Medical History  He,  has a past medical history of Gout and Hypertension.   Surgical History    Past Surgical History:  Procedure Laterality Date  . PARTIAL GASTRECTOMY  1990   done in [place] for PUD  . PROSTATE BIOPSY  ~ 2005   negative for cancer (done in [place])     Social History   reports that he has never smoked. He has never used smokeless tobacco. He reports that  he does not drink alcohol and does not use drugs.   Family History   His family history includes Diabetes in his father and mother.   Allergies No Known Allergies   Home Medications  Prior to Admission medications   Medication Sig Start Date End Date Taking? Authorizing Provider  allopurinol (ZYLOPRIM) 100 MG tablet Take 3 tablets (300 mg total) by mouth daily. 02/17/18   Reubin Milan, MD  amLODipine (NORVASC) 10 MG tablet Take 1 tablet (10 mg total) by mouth daily. 02/17/18   Reubin Milan, MD  meclizine (ANTIVERT) 25 MG tablet Take 1 tablet (25 mg total) by mouth 3 (three) times daily as needed for dizziness. 03/11/20   Lutricia Feil, PA-C  meloxicam (MOBIC) 15 MG tablet Take 1 tablet (15 mg total) by mouth daily. 03/09/18   Reubin Milan, MD  tamsulosin (FLOMAX) 0.4 MG CAPS capsule Take 1 capsule (0.4 mg total) by mouth daily after supper. 03/09/18   Reubin Milan, MD  tizanidine (ZANAFLEX) 2 MG capsule Take 2 mg by mouth 3 (three) times daily.    [provider]     Total critical care time: 48 minutes  Performed by: Cheri Fowler   Critical care time was exclusive of separately billable procedures and treating other patients.   Critical care was necessary to treat or prevent imminent or life-threatening deterioration.   Critical care was time spent personally by me on the following activities: development of treatment plan with patient and/or surrogate as well as nursing, discussions with consultants, evaluation of patient's response to treatment, examination of patient, obtaining history from patient or surrogate, ordering and performing treatments and interventions, ordering and review of laboratory studies, ordering and review of radiographic studies, pulse oximetry and re-evaluation of patient's condition.   Cheri Fowler MD Critical care physician [facility]  Pager: [phone] Mobile: [phone]

## 2020-10-05 NOTE — Progress Notes (Signed)
Pt is a 64 yr old healthy male who was last known well last night at bedtime (11pm). At 3 AM he noted feeling weak on the left side. At 11 AM when symptoms had not resolved he had his son take him to [facility]. At [facility] he underwent stroke workup and was found to have a Rt M1 occlusion with a large pneumbra. He is not a candidate for TPA due to being outside of treatment window. Code IR activation was called out at 1501. Pt arrived at [facility] ED at 1535. He arrived at [facility] [facility] at 1536. He was examined in Mandarin by Dr Roda Shutters and was found to have an NIHSS of 7 for left sided weakness, sensory loss and left hemianopia. Pt intubated and taken to IR suite at 1551.

## 2020-10-05 NOTE — ED Notes (Signed)
Signature pad not working. Pt and son verbalize consent for transfer, Annice Pih RN witness and Dr Riley Lam

## 2020-10-05 NOTE — Consult Note (Signed)
CODE STROKE- PHARMACY COMMUNICATION   Time CODE STROKE called/page received: 1420  Time response to CODE STROKE was made (in person or via phone): 1429  Time Stroke Kit retrieved from [place] (only if needed): N/A   ([facility] outside window @ 0300-0400)  Name of Provider/Nurse contacted: Dr Quentin Cornwall  Past Medical History:  Diagnosis Date  . Gout   . Hypertension    Prior to Admission medications   Medication Sig Start Date End Date Taking? Authorizing Provider  allopurinol (ZYLOPRIM) 100 MG tablet Take 3 tablets (300 mg total) by mouth daily. 02/17/18   Glean Hess, MD  amLODipine (NORVASC) 10 MG tablet Take 1 tablet (10 mg total) by mouth daily. 02/17/18   Glean Hess, MD  meclizine (ANTIVERT) 25 MG tablet Take 1 tablet (25 mg total) by mouth 3 (three) times daily as needed for dizziness. 03/11/20   Lorin Picket, PA-C  meloxicam (MOBIC) 15 MG tablet Take 1 tablet (15 mg total) by mouth daily. 03/09/18   Glean Hess, MD  tamsulosin (FLOMAX) 0.4 MG CAPS capsule Take 1 capsule (0.4 mg total) by mouth daily after supper. 03/09/18   Glean Hess, MD  tizanidine (ZANAFLEX) 2 MG capsule Take 2 mg by mouth 3 (three) times daily.    [provider]    Lu Duffel, PharmD, BCPS Clinical Pharmacist 10/06/2020 2:32 PM

## 2020-10-05 NOTE — Progress Notes (Signed)
Patient arrived on 4NICU at 53.  Patient is intubated and right shealth in place.  Level 0 and pulses palpable.  Currently patient is on high dose of propofol and unable to do neuro assessment.  No reflex currently.  CCM at bedside and is aware.  Weaning sedation down.  Stroke MD notified that patient is on unit.     Belongings include clothes, belt, socks and pants.

## 2020-10-05 NOTE — Progress Notes (Signed)
Patient ID: Mark May, male   DOB: 1956-03-22, 64 y.o.   MRN: [id number] INR. 64 Y RT H M MRS 0  LSW 3 am today. New onset of Lt sided weakness.and numbness. CT brain No ICH . Old RT temporal lobe ?infarct. CTA RT MCA M1 near complete stenosis due to thrombus and /or underlying ICAD.  CTP core of 20 ml with a penumbra of . Endovascular treatment D/W son and patient. Procedure,reasons,and alternatives reviewed. Risks of ICH of 10 % ,worsening neuro deficit death and inability to revascularize reviewed. Son expressed understanding and a witnessed consent was obtained for the treatment. S.Zoraya Fiorenza MD.

## 2020-10-05 NOTE — ED Notes (Signed)
EMTALA reviewed. 

## 2020-10-05 NOTE — ED Notes (Signed)
Called [facility] for emergency transport to [facility]  [phone]

## 2020-10-05 NOTE — ED Triage Notes (Signed)
Pt to ED via POV c/o left side numbness that started between 0300 and 0400. Pt states that he woke up to use the bathroom and noticed that he was not able to hold the tissue in his left hand. Pt states that he has decreased sensation in his left arm and is not able to carry anything in his arm. Pt denies changes in vision. Pt states that he felt fine before he went to bed last night. No facial droop or slurred speech noted. Pt states that he did have some issues with his balance this morning when he got up.  Pt triaged using VRI: Chin #[id number]

## 2020-10-05 NOTE — Progress Notes (Signed)
Ch met with Pt's son LeZhang in the CT hallway in response to Code STROKE. Ch checked in on him, and he asked about hospital's current visitation policy. Ch accompanied Pt and team to room. Ch provided pastoral presence and support. After it was determined, that Pt was to be moved to [place] for procedure, Ch escorted LeZhang to ED entrance after EMS took Pt from room. Pt's son said that he was grateful for quick, professional and caring team. 

## 2020-10-05 NOTE — Sedation Documentation (Signed)
8 F sheath to right groin to remain in place.

## 2020-10-05 NOTE — H&P (Signed)
Stroke Neurology Admission Note  The history was obtained from the pt.  During history and examination, all items were able to obtain unless otherwise noted.  Patient native language is Mandarin.  I was able to communicate with patient in Mandarin.  I also called patient's son and discussed with him in Mandarin.  History of Present Illness:  Mark May is a 64 y.o. Asian male with PMH of hypertension, BPH, sciatica, stomach ulcer status post surgery in the past presented to ER for acute onset left-sided weakness, left facial droop and left hemianopia.  Per patient, he woke up 3 AM to go to bathroom.  Initially, he was ambulating to bathroom without problem, however in the bathroom, he started to have difficulty on the left side, feeding numbness on the left arm and leg, not able to hold paper tissue on the left hand.  He told his wife, they felt that he probably sleep in wrong position.  Did not pay too much attention.  Patient got up around 8 AM, continue have left arm weakness and also had left leg weakness along with numbness.  He did not tell his son until 3711 AM.  Son brought him to [facility].  Plan CT showed no acute abnormality, chronic right temporal lobe encephalomalacia.  CT head and neck showed right M1 high-grade stenosis versus near occlusive thrombosis.  CT perfusion showed large penumbra.  [facility].  Per patient and son, he has no history of stroke, but did have intermittent left-sided arm and leg numbness.  They contributed to sciatica.  Patient stated that he has hypertension but did not take any medication.  He had peptic ulcer disease status post surgery in the past, without any problem since.  LSN: 3 AM today tPA Given: No: Outside window  Past Medical History:  Diagnosis Date  . Gout   . Hypertension     Past Surgical History:  Procedure Laterality Date  . PARTIAL GASTRECTOMY  1990   done in  [place] for PUD  . PROSTATE BIOPSY  ~ 2005   negative for cancer (done in [place])    Family History  Problem Relation Age of Onset  . Diabetes Mother   . Diabetes Father     Social History:  reports that he has never smoked. He has never used smokeless tobacco. He reports that he does not drink alcohol and does not use drugs.  Allergies: No Known Allergies  Current Facility-Administered Medications on File Prior to Encounter  Medication Dose Route Frequency Provider Last Rate Last Admin  . lactated ringers infusion   Intravenous Continuous PRN Maness, Howie Illarrie B, CRNA   New Bag at 10/18/2020 1545  . rocuronium (ZEMURON) injection   Intravenous Anesthesia Intra-op Maness, Carrie B, CRNA   40 mg at 10/30/2020 1556   Current Outpatient Medications on File Prior to Encounter  Medication Sig Dispense Refill  . allopurinol (ZYLOPRIM) 100 MG tablet Take 3 tablets (300 mg total) by mouth daily. 90 tablet 3  . amLODipine (NORVASC) 10 MG tablet Take 1 tablet (10 mg total) by mouth daily. 30 tablet 5  . meclizine (ANTIVERT) 25 MG tablet Take 1 tablet (25 mg total) by mouth 3 (three) times daily as needed for dizziness. 30 tablet 0  . meloxicam (MOBIC) 15 MG tablet Take 1 tablet (15 mg total) by mouth daily. 30 tablet 5  . tamsulosin (FLOMAX) 0.4 MG CAPS capsule Take 1 capsule (0.4 mg total)  by mouth daily after supper. 30 capsule 5  . tizanidine (ZANAFLEX) 2 MG capsule Take 2 mg by mouth 3 (three) times daily.      Review of Systems: A full ROS was attempted today and was able to be performed.  Systems assessed include - Constitutional, Eyes, HENT, Respiratory, Cardiovascular, Gastrointestinal, Genitourinary, Integument/breast, Hematologic/lymphatic, Musculoskeletal, Neurological, Behavioral/Psych, Endocrine, Allergic/Immunologic - with pertinent responses as per HPI.  Physical Examination: Temp:  [97.5 F (36.4 C)-98.1 F (36.7 C)] 98.1 F (36.7 C) (11/05 1505) Pulse Rate:  [66-80] 76 (11/05  1505) Resp:  [14-20] 20 (11/05 1505) BP: (187-194)/(95-107) 194/107 (11/05 1505) SpO2:  [98 %] 98 % (11/05 1505) Weight:  [56.7 kg] 56.7 kg (11/05 1215)  General - well nourished, well developed, in no apparent distress.    Ophthalmologic - fundi not visualized due to noncooperation.    Cardiovascular - regular rhythm and rate  Mental Status -  Level of arousal and orientation to time, place, and person were intact. Language including expression, naming, repetition, comprehension was assessed and found intact.  Mild dysarthria.  Cranial Nerves II - XII - II - left hemianopia. III, IV, VI - Extraocular movements intact.  Right gaze preference, however able to gaze bilaterally V - Facial sensation intact bilaterally. VII - left mild facial droop. VIII - Hearing & vestibular intact bilaterally. X - Palate elevates symmetrically. XI - Chin turning & shoulder shrug intact bilaterally. XII - Tongue protrusion intact.  Motor Strength - The patient's strength was normal in RUE and RLE, however, LUE drift to bed before 10sec. LLE 3-/5.   Motor Tone & Bulk - Muscle tone was assessed at the neck and appendages and was normal.  Bulk was normal and fasciculations were absent.   Reflexes - The patient's reflexes were decreased on the left and he had no pathological reflexes.  Sensory - Light touch, temperature/pinprick were assessed and were decreased on the left.    Coordination - The patient had normal movements in the right hand with no ataxia or dysmetria.  Tremor was absent.  Gait and Station - deferred  NIH Stroke Scale  Level Of Consciousness 0=Alert; keenly responsive 1=Arouse to minor stimulation 2=Requires repeated stimulation to arouse or movements to pain 3=postures or unresponsive 0  LOC Questions to Month and Age 30=Answers both questions correctly 1=Answers one question correctly or dysarthria/intubated/trauma/language barrier 2=Answers neither question correctly or  aphasia 0  LOC Commands      -Open/Close eyes     -Open/close grip     -Pantomime commands if communication barrier 0=Performs both tasks correctly 1=Performs one task correctly 2=Performs neighter task correctly 0  Best Gaze     -Only assess horizontal gaze 0=Normal 1=Partial gaze palsy 2=Forced deviation, or total gaze paresis 0  Visual 0=No visual loss 1=Partial hemianopia 2=Complete hemianopia 3=Bilateral hemianopia (blind including cortical blindness) 2  Facial Palsy     -Use grimace if obtunded 0=Normal symmetrical movement 1=Minor paralysis (asymmetry) 2=Partial paralysis (lower face) 3=Complete paralysis (upper and lower face) 1  Motor  0=No drift for 10/5 seconds 1=Drift, but does not hit bed 2=Some antigravity effort, hits  bed 3=No effort against gravity, limb falls 4=No movement 0=Amputation/joint fusion Right Arm 0     Leg 0    Left Arm 2     Leg 1  Limb Ataxia     - FNT/HTS 0=Absent or does not understand or paralyzed or amputation/joint fusion 1=Present in one limb 2=Present in two limbs 0  Sensory 0=Normal  1=Mild to moderate sensory loss 2=Severe to total sensory loss or coma/unresponsive 1  Best Language 0=No aphasia, normal 1=Mild to moderate aphasia 2=Severe aphasia 3=Mute, global aphasia, or coma/unresponsive 0  Dysarthria 0=Normal 1=Mild to moderate 2=Severe, unintelligible or mute/anarthric 0=intubated/unable to test 1  Extinction/Neglect 0=No abnormality 1=visual/tactile/auditory/spatia/personal inattention/Extinction to bilateral simultaneous stimulation 2=Profound neglect/extinction more than 1 modality  1  Total   9     Data Reviewed: CT Angio Head W or Wo Contrast  Result Date: 10/08/2020 CLINICAL DATA:  Left-sided numbness and weakness and right gaze preference. EXAM: CT ANGIOGRAPHY HEAD AND NECK CT PERFUSION BRAIN TECHNIQUE: Multidetector CT imaging of the head and neck was performed using the standard protocol during bolus  administration of intravenous contrast. Multiplanar CT image reconstructions and MIPs were obtained to evaluate the vascular anatomy. Carotid stenosis measurements (when applicable) are obtained utilizing NASCET criteria, using the distal internal carotid diameter as the denominator. Multiphase CT imaging of the brain was performed following IV bolus contrast injection. Subsequent parametric perfusion maps were calculated using RAPID software. CONTRAST:  OMNIPAQUE IOHEXOL 350 MG/ML SOLN COMPARISON:  None. FINDINGS: CTA NECK FINDINGS Aortic arch: Normal variant aortic arch branching pattern with common origin of the brachiocephalic and left common carotid arteries. Mild arch atherosclerosis without arch vessel origin stenosis. Right carotid system: Patent without evidence of stenosis, dissection, or significant atherosclerosis. Left carotid system: Patent without evidence of stenosis, dissection, or significant atherosclerosis. Vertebral arteries: Patent without evidence of stenosis, dissection, or significant atherosclerosis. Strongly dominant left vertebral artery. Skeleton: Old medial left orbital fracture. 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 Chronic appearing left mastoid air cell opacification. Mild disc degeneration at C4-5. Other neck: No evidence of cervical lymphadenopathy or mass. Upper chest: Clear lung apices. Review of the MIP images confirms the above findings CTA HEAD FINDINGS Anterior circulation: The internal carotid arteries are patent from skull base to carotid termini with atherosclerotic plaque resulting in mild left cavernous segment stenosis. A posteriorly projecting aneurysm arising from the distal right supraclinoid ICA measures 4 mm. ACAs and MCAs are patent without evidence of a significant A1 or left M1 stenosis. There is severe irregular narrowing of the distal right M1 segment over a length of 7 mm. No M2 occlusion is  identified. Posterior circulation: The intracranial vertebral arteries are patent to the basilar. Patent AICA and SCA origins are seen bilaterally. The basilar artery is widely patent. There are small right and diminutive or absent left posterior communicating arteries. Both PCAs are patent without evidence of a significant proximal stenosis on the right. There is a moderate mid left P2 stenosis. No aneurysm is identified. Venous sinuses: As permitted by contrast timing, patent. Anatomic variants: None. Review of the MIP images confirms the above findings CT Brain Perfusion Findings: ASPECTS: 8 CBF (<30%) Volume: 20 mL Perfusion (Tmax>6.0s) volume: 151 mL Mismatch Volume: 131 mL Infarction Location: Core infarct in the right parietal and temporal lobes (with underlying chronic encephalomalacia in the anterior and lateral right temporal lobe as well) with penumbra throughout the majority of the right MCA territory. IMPRESSION: 1. 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 2. Right MCA infarct with extensive penumbra. 3. 4 mm right supraclinoid ICA aneurysm. 4. Widely patent cervical carotid and vertebral arteries. 5. Aortic Atherosclerosis (ICD10-I70.0). These results were called by telephone at the time of interpretation on 10/01/2020 at 2:45 p.m. to Dr. Wilford Corner, who verbally acknowledged these results. Electronically Signed   By: Freida Busman  Mosetta Putt M.D.   On: 10/18/2020 15:09   CT HEAD WO CONTRAST  Result Date: 10/20/2020 CLINICAL DATA:  Dizziness. Left upper extremity weakness. Headache. EXAM: CT HEAD WITHOUT CONTRAST TECHNIQUE: Contiguous axial images were obtained from the base of the skull through the vertex without intravenous contrast. COMPARISON:  None. FINDINGS: Brain: Minimally enlarged ventricles and subarachnoid spaces. Mild patchy white matter low density in both cerebral hemispheres. No intracranial hemorrhage, mass lesion or CT evidence of acute infarction. Vascular: No hyperdense vessel or  unexpected calcification. Skull: Normal. Negative for fracture or focal lesion. Sinuses/Orbits: Unremarkable. Other: None. IMPRESSION: 1. No acute abnormality. 2. Minimal diffuse cerebral and cerebellar atrophy. 3. Mild chronic small vessel white matter ischemic changes in both cerebral hemispheres. Electronically Signed   By: Beckie Salts M.D.   On: 10/23/2020 13:01   CT Angio Neck W and/or Wo Contrast  Result Date: 10/10/2020 CLINICAL DATA:  Left-sided numbness and weakness and right gaze preference. EXAM: CT ANGIOGRAPHY HEAD AND NECK CT PERFUSION BRAIN TECHNIQUE: Multidetector CT imaging of the head and neck was performed using the standard protocol during bolus administration of intravenous contrast. Multiplanar CT image reconstructions and MIPs were obtained to evaluate the vascular anatomy. Carotid stenosis measurements (when applicable) are obtained utilizing NASCET criteria, using the distal internal carotid diameter as the denominator. Multiphase CT imaging of the brain was performed following IV bolus contrast injection. Subsequent parametric perfusion maps were calculated using RAPID software. CONTRAST:  OMNIPAQUE IOHEXOL 350 MG/ML SOLN COMPARISON:  None. FINDINGS: CTA NECK FINDINGS Aortic arch: Normal variant aortic arch branching pattern with common origin of the brachiocephalic and left common carotid arteries. Mild arch atherosclerosis without arch vessel origin stenosis. Right carotid system: Patent without evidence of stenosis, dissection, or significant atherosclerosis. Left carotid system: Patent without evidence of stenosis, dissection, or significant atherosclerosis. Vertebral arteries: Patent without evidence of stenosis, dissection, or significant atherosclerosis. Strongly dominant left vertebral artery. Skeleton: Old medial left orbital fracture. 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 Chronic appearing left mastoid air cell  opacification. Mild disc degeneration at C4-5. Other neck: No evidence of cervical lymphadenopathy or mass. Upper chest: Clear lung apices. Review of the MIP images confirms the above findings CTA HEAD FINDINGS Anterior circulation: The internal carotid arteries are patent from skull base to carotid termini with atherosclerotic plaque resulting in mild left cavernous segment stenosis. A posteriorly projecting aneurysm arising from the distal right supraclinoid ICA measures 4 mm. ACAs and MCAs are patent without evidence of a significant A1 or left M1 stenosis. There is severe irregular narrowing of the distal right M1 segment over a length of 7 mm. No M2 occlusion is identified. Posterior circulation: The intracranial vertebral arteries are patent to the basilar. Patent AICA and SCA origins are seen bilaterally. The basilar artery is widely patent. There are small right and diminutive or absent left posterior communicating arteries. Both PCAs are patent without evidence of a significant proximal stenosis on the right. There is a moderate mid left P2 stenosis. No aneurysm is identified. Venous sinuses: As permitted by contrast timing, patent. Anatomic variants: None. Review of the MIP images confirms the above findings CT Brain Perfusion Findings: ASPECTS: 8 CBF (<30%) Volume: 20 mL Perfusion (Tmax>6.0s) volume: 151 mL Mismatch Volume: 131 mL Infarction Location: Core infarct in the right parietal and temporal lobes (with underlying chronic encephalomalacia in the anterior and lateral right temporal lobe as well) with penumbra throughout the majority of the right MCA  territory. IMPRESSION: 1. 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 2. Right MCA infarct with extensive penumbra. 3. 4 mm right supraclinoid ICA aneurysm. 4. Widely patent cervical carotid and vertebral arteries. 5. Aortic Atherosclerosis (ICD10-I70.0). These results were called by telephone at the time of interpretation on 10/08/2020 at  2:45 p.m. to Dr. Wilford Corner, who verbally acknowledged these results. Electronically Signed   By: Sebastian Ache M.D.   On: 10/08/2020 15:09   CT CEREBRAL PERFUSION W CONTRAST  Result Date: 10/08/20 CLINICAL DATA:  Left-sided numbness and weakness and right gaze preference. EXAM: CT ANGIOGRAPHY HEAD AND NECK CT PERFUSION BRAIN TECHNIQUE: Multidetector CT imaging of the head and neck was performed using the standard protocol during bolus administration of intravenous contrast. Multiplanar CT image reconstructions and MIPs were obtained to evaluate the vascular anatomy. Carotid stenosis measurements (when applicable) are obtained utilizing NASCET criteria, using the distal internal carotid diameter as the denominator. Multiphase CT imaging of the brain was performed following IV bolus contrast injection. Subsequent parametric perfusion maps were calculated using RAPID software. CONTRAST:  OMNIPAQUE IOHEXOL 350 MG/ML SOLN COMPARISON:  None. FINDINGS: CTA NECK FINDINGS Aortic arch: Normal variant aortic arch branching pattern with common origin of the brachiocephalic and left common carotid arteries. Mild arch atherosclerosis without arch vessel origin stenosis. Right carotid system: Patent without evidence of stenosis, dissection, or significant atherosclerosis. Left carotid system: Patent without evidence of stenosis, dissection, or significant atherosclerosis. Vertebral arteries: Patent without evidence of stenosis, dissection, or significant atherosclerosis. Strongly dominant left vertebral artery. Skeleton: Old medial left orbital fracture. 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 Chronic appearing left mastoid air cell opacification. Mild disc degeneration at C4-5. Other neck: No evidence of cervical lymphadenopathy or mass. Upper chest: Clear lung apices. Review of the MIP images confirms the above findings CTA HEAD FINDINGS Anterior circulation: The  internal carotid arteries are patent from skull base to carotid termini with atherosclerotic plaque resulting in mild left cavernous segment stenosis. A posteriorly projecting aneurysm arising from the distal right supraclinoid ICA measures 4 mm. ACAs and MCAs are patent without evidence of a significant A1 or left M1 stenosis. There is severe irregular narrowing of the distal right M1 segment over a length of 7 mm. No M2 occlusion is identified. Posterior circulation: The intracranial vertebral arteries are patent to the basilar. Patent AICA and SCA origins are seen bilaterally. The basilar artery is widely patent. There are small right and diminutive or absent left posterior communicating arteries. Both PCAs are patent without evidence of a significant proximal stenosis on the right. There is a moderate mid left P2 stenosis. No aneurysm is identified. Venous sinuses: As permitted by contrast timing, patent. Anatomic variants: None. Review of the MIP images confirms the above findings CT Brain Perfusion Findings: ASPECTS: 8 CBF (<30%) Volume: 20 mL Perfusion (Tmax>6.0s) volume: 151 mL Mismatch Volume: 131 mL Infarction Location: Core infarct in the right parietal and temporal lobes (with underlying chronic encephalomalacia in the anterior and lateral right temporal lobe as well) with penumbra throughout the majority of the right MCA territory. IMPRESSION: 1. 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 2. Right MCA infarct with extensive penumbra. 3. 4 mm right supraclinoid ICA aneurysm. 4. Widely patent cervical carotid and vertebral arteries. 5. Aortic Atherosclerosis (ICD10-I70.0). These results were called by telephone at the time of interpretation on Oct 08, 2020 at 2:45 p.m. to Dr. Wilford Corner, who verbally acknowledged these results. Electronically Signed   By:  Sebastian Ache M.D.   On: 10/07/2020 15:09    Assessment: 64 y.o. male with PMH of hypertension, BPH, sciatica, peptic ulcer status post surgery  in the past presented to ER for acute onset left-sided weakness, left facial droop and left hemianopia.  NIH score 9.  Time onset 3 AM.  Patient not a TPA candidate given outside window.  CT no acute abnormality, but chronic right temporal encephalomalacia, concerning for old infarct.  CT head and neck showed right M1 high-grade stenosis versus subocclusive thrombus.  CT perfusion showed large penumbra.  Agree with Dr. Jerrell Belfast that patient is a candidate for thrombectomy.  Currently in ER, direct IR transfer for thrombectomy with Dr. Corliss Skains.  Consent obtained with son by Dr. Jerrell Belfast and Dr. Corliss Skains.  Stroke Risk Factors - hypertension and Possible chronic infarct on the right  Plan: - Direct IR transfer for thrombectomy - Admission to ICU postprocedure - HgbA1c, fasting lipid panel - MRI, MRA  of the brain without contrast - PT consult, OT consult, Speech consult - Echocardiogram - Risk factor modification - Telemetry monitoring - Frequent neuro checks  This patient is critically ill due to acute stroke on the right with LVO and candidate for thrombectomy and at significant risk of neurological worsening, death form large stroke, hemorrhagic conversion, procedure complications. This patient's care requires constant monitoring of vital signs, hemodynamics, respiratory and cardiac monitoring, review of multiple databases, neurological assessment, discussion with family, other specialists and medical decision making of high complexity. I spent 40 minutes of neurocritical care time in the care of this patient. I had long discussion with pt and son, updated pt current condition, treatment plan and potential prognosis, and answered all the questions. They expressed understanding and appreciation.   Marvel Plan, MD PhD Stroke Neurology 10/19/2020 4:47 PM

## 2020-10-05 NOTE — ED Notes (Signed)
Multiple RN attempt IV access with no success. Mark May attempting [place] IV

## 2020-10-05 NOTE — ED Notes (Signed)
Pt back to rm 8

## 2020-10-05 NOTE — Anesthesia Procedure Notes (Signed)
Arterial Line Insertion Start/End11/05/2020 3:57 PM, 10/05/2020 3:59 PM Performed by: Sheppard Evens, CRNA, CRNA  Preanesthetic checklist: patient identified, IV checked, risks and benefits discussed, surgical consent, monitors and equipment checked, pre-op evaluation and timeout performed Left, radial was placed Catheter size: 20 G Hand hygiene performed  and maximum sterile barriers used   Attempts: 1 Procedure performed without using ultrasound guided technique. Ultrasound Notes:anatomy identified Following insertion, dressing applied and Biopatch.

## 2020-10-05 NOTE — Progress Notes (Signed)
S: CT head has been done following completion of cangrelor 4 hour infusion.   O: BP 140/76    Pulse 64    Resp 18    SpO2 100%   Exam: (performed after propofol held) Ment: Opens eyes to sternal rub and begins shaking RUE vigourously in apparent attempt to remove mitt. Does not attempt to communicate.  CN: Eyes conjugate. PERRL. Face flaccidly symmetric in the context of intubation.  Motor/Sensory:  RUE 5/5 with normal reaction to stimulation RLE 5/5 purposeful withdrawal to plantar stimulation LUE Flaccid tone. Strength 0/5 except for weak adduction at shoulder to noxious. After a delay will start moving RUE in response to noxious applied to LUE LLE 4/5 withdrawal to plantar stimulation Reflexes: 3+ bilateral patellae. Right toe upgoing, left equivocal.   CT head following completion of cangrelor infusion: - Right temporal lobe hypodensity compatible with acute infarct. Decreased hyperdensity within this stroke compared with 4 hours previously compatible with contrast staining rather than hemorrhage. - Small area of acute infarct in the right parietal lobe now evident. - Right MCA stent.  A/R: 64 year old male s/p  complete revascularization of near complete occlusion of RT MCA M1 seg with 1 pass with tiger 21 retriever and aspiration achieving a TICI 3 revascularization. Placement of rescue stent m4 mm x 24 mm Atlas for reocclusion with restoration of TICI 3. - Cangrelor infusion completed - Per VIR protocol, was loaded with 81 aspirin and 180 mg of brilinta po  - Repeat CT head following cangrelor reveals no hemorrhagic conversion. Right temporal lobe stroke and new right parietal lobe stroke are seen, in addition to right MCA stent.  - Exam reveals left sided weakness, arm worse than leg - Will need to restart propofol sedation due to agitation.  - Will continue to closely monitor  10 minutes of critical care time.   Electronically signed: Dr. Caryl Pina

## 2020-10-05 NOTE — Anesthesia Preprocedure Evaluation (Signed)
Anesthesia Evaluation  Patient identified by MRN, date of birth, ID bandPreop documentation limited or incomplete due to emergent nature of procedure.  Airway Mallampati: II  TM Distance: >3 FB Neck ROM: Full    Dental  (+) Poor Dentition   Pulmonary    Pulmonary exam normal        Cardiovascular hypertension, Pt. on medications  Rhythm:Regular Rate:Normal     Neuro/Psych CVA negative psych ROS   GI/Hepatic negative GI ROS, Neg liver ROS,   Endo/Other  negative endocrine ROS  Renal/GU negative Renal ROS  negative genitourinary   Musculoskeletal negative musculoskeletal ROS (+)   Abdominal (+)  Abdomen: soft. Bowel sounds: normal.  Peds  Hematology negative hematology ROS (+)   Anesthesia Other Findings   Reproductive/Obstetrics                             Anesthesia Physical Anesthesia Plan  ASA: V and emergent  Anesthesia Plan: General   Post-op Pain Management:    Induction: Rapid sequence and Intravenous  PONV Risk Score and Plan: 2 and Ondansetron and Treatment may vary due to age or medical condition  Airway Management Planned: Mask and Oral ETT  Additional Equipment: Arterial line  Intra-op Plan:   Post-operative Plan: Possible Post-op intubation/ventilation  Informed Consent: I have reviewed the patients History and Physical, chart, labs and discussed the procedure including the risks, benefits and alternatives for the proposed anesthesia with the patient or authorized representative who has indicated his/her understanding and acceptance.     Dental advisory given  Plan Discussed with: CRNA  Anesthesia Plan Comments: (Lab Results      Component                Value               Date                      WBC                      7.1                 10/10/2020                HGB                      16.5                10/06/2020                HCT                       49.1                10/03/2020                MCV                      89.4                10/09/2020                PLT                      291  October 26, 2020          )       Anesthesia Quick Evaluation

## 2020-10-05 NOTE — Progress Notes (Signed)
Called to CT for the patient needing a ventilator.  Placed patient on Servo vent with charted settings.  After the CT scan was completed, transported the patient to 4N ICU on same settings without complication.  Patient tolerated well.

## 2020-10-05 NOTE — Sedation Documentation (Signed)
Received report from [employer], rn

## 2020-10-05 NOTE — ED Notes (Signed)
When assisting pt transfer from wheelchair to stretcher, pt noted to have flaccid movement of the left leg as well as previous noted of the left arm, pt seems to be altered but unsure due to language barrier at this time, RN notified of changes since arriving to the ED

## 2020-10-05 NOTE — ED Notes (Signed)
Per Dr. Roxan Hockey called code stroke to [phone]

## 2020-10-06 ENCOUNTER — Inpatient Hospital Stay (HOSPITAL_COMMUNITY): Payer: 59

## 2020-10-06 ENCOUNTER — Other Ambulatory Visit (HOSPITAL_COMMUNITY): Payer: 59

## 2020-10-06 DIAGNOSIS — I6389 Other cerebral infarction: Secondary | ICD-10-CM

## 2020-10-06 DIAGNOSIS — I6601 Occlusion and stenosis of right middle cerebral artery: Secondary | ICD-10-CM | POA: Diagnosis not present

## 2020-10-06 DIAGNOSIS — I340 Nonrheumatic mitral (valve) insufficiency: Secondary | ICD-10-CM

## 2020-10-06 LAB — CBC WITH DIFFERENTIAL/PLATELET
Abs Immature Granulocytes: 0.05 10*3/uL (ref 0.00–0.07)
Basophils Absolute: 0 10*3/uL (ref 0.0–0.1)
Basophils Relative: 0 %
Eosinophils Absolute: 0.1 10*3/uL (ref 0.0–0.5)
Eosinophils Relative: 1 %
HCT: 40.4 % (ref 39.0–52.0)
Hemoglobin: 13.5 g/dL (ref 13.0–17.0)
Immature Granulocytes: 1 %
Lymphocytes Relative: 15 %
Lymphs Abs: 1.6 10*3/uL (ref 0.7–4.0)
MCH: 29.8 pg (ref 26.0–34.0)
MCHC: 33.4 g/dL (ref 30.0–36.0)
MCV: 89.2 fL (ref 80.0–100.0)
Monocytes Absolute: 0.7 10*3/uL (ref 0.1–1.0)
Monocytes Relative: 7 %
Neutro Abs: 8.2 10*3/uL — ABNORMAL HIGH (ref 1.7–7.7)
Neutrophils Relative %: 76 %
Platelets: 232 10*3/uL (ref 150–400)
RBC: 4.53 MIL/uL (ref 4.22–5.81)
RDW: 13.1 % (ref 11.5–15.5)
WBC: 10.7 10*3/uL — ABNORMAL HIGH (ref 4.0–10.5)
nRBC: 0 % (ref 0.0–0.2)

## 2020-10-06 LAB — LIPID PANEL
Cholesterol: 173 mg/dL (ref 0–200)
HDL: 33 mg/dL — ABNORMAL LOW (ref >40)
LDL Cholesterol: 85 mg/dL (ref 0–99)
Total CHOL/HDL Ratio: 5.2 RATIO
Triglycerides: 277 mg/dL — ABNORMAL HIGH (ref <150)
VLDL: 55 mg/dL — ABNORMAL HIGH (ref 0–40)

## 2020-10-06 LAB — BASIC METABOLIC PANEL
Anion gap: 11 (ref 5–15)
BUN: 10 mg/dL (ref 8–23)
CO2: 22 mmol/L (ref 22–32)
Calcium: 8.2 mg/dL — ABNORMAL LOW (ref 8.9–10.3)
Chloride: 106 mmol/L (ref 98–111)
Creatinine, Ser: 1.02 mg/dL (ref 0.61–1.24)
GFR, Estimated: >60 mL/min (ref >60)
Glucose, Bld: 112 mg/dL — ABNORMAL HIGH (ref 70–99)
Potassium: 3.6 mmol/L (ref 3.5–5.1)
Sodium: 139 mmol/L (ref 135–145)

## 2020-10-06 LAB — ECHOCARDIOGRAM COMPLETE
Area-P 1/2: 2.99 cm2
Height: 67 in
S' Lateral: 1.9 cm
Weight: 2000 oz

## 2020-10-06 LAB — TRIGLYCERIDES: Triglycerides: 275 mg/dL — ABNORMAL HIGH (ref <150)

## 2020-10-06 LAB — HEMOGLOBIN A1C
Hgb A1c MFr Bld: 5.8 % — ABNORMAL HIGH (ref 4.8–5.6)
Mean Plasma Glucose: 119.76 mg/dL

## 2020-10-06 LAB — HIV ANTIBODY (ROUTINE TESTING W REFLEX): HIV Screen 4th Generation wRfx: NONREACTIVE

## 2020-10-06 MED ORDER — DOCUSATE SODIUM 50 MG/5ML PO LIQD
100.0000 mg | Freq: Two times a day (BID) | ORAL | Status: DC
  Administered 2020-10-06 (×2): 100 mg
  Filled 2020-10-06: qty 10

## 2020-10-06 MED ORDER — SENNOSIDES-DOCUSATE SODIUM 8.6-50 MG PO TABS: 1.0000 | ORAL_TABLET | Freq: Every evening | ORAL | Status: DC | PRN

## 2020-10-06 MED ORDER — CLEVIDIPINE BUTYRATE 0.5 MG/ML IV EMUL
0.0000 mg/h | INTRAVENOUS | Status: AC
  Administered 2020-10-07: 10 mg/h via INTRAVENOUS
  Administered 2020-10-07: 18 mg/h via INTRAVENOUS
  Administered 2020-10-07: 21 mg/h via INTRAVENOUS
  Filled 2020-10-06 (×3): qty 50

## 2020-10-06 MED ORDER — CLEVIDIPINE BUTYRATE 0.5 MG/ML IV EMUL
INTRAVENOUS | Status: AC
  Filled 2020-10-06: qty 50

## 2020-10-06 NOTE — Progress Notes (Signed)
Pt was identified by name and d.o.b. 8Fr Sheath was removed from right groin at 0904 hrs. Manual pressure was held for 20 minutes and hemostasis was achieved at 0924 hrs. Quikclot dressing/gauze/tegaderm was applied to the site. Distal pulses were palpable 2's at this time. Hemostasis and pulses were verified by Britt Boozer RN and myself. Quikclot dressing will need to come off in 24 hrs.

## 2020-10-06 NOTE — Progress Notes (Signed)
NAME:  Mark May, MRN:  [id number], DOB:  [birth year], LOS: 1 ADMISSION DATE:  10/13/2020, CONSULTATION DATE: 10/19/2020 REFERRING MD: Dr. Curtis Sites, CHIEF COMPLAINT: Left-sided weakness  Brief History   64 year old male with hypertension and hyperlipidemia who presented with left-sided weakness, noted to have right MCA occlusion status post thrombectomy and stent placement.  Postprocedure CT showed contrast extravasation.  Past Medical History  Hypertension Peptic ulcer disease Gout  Significant Hospital Events   Mechanical thrombectomy of right MCA and stent 11/5  Consults:  IR Neurology PCCM  Procedures:  11/6 right femoral sheath was removed  Significant Diagnostic Tests:  11/5 CT head: No acute abnormality. Minimal diffuse cerebral and cerebellar atrophy. Mild chronic small vessel white matter ischemic changes in both cerebral hemispheres  11/5 CTA head and neck: Severe distal right M1 stenosis or subocclusive thrombus, Right MCA infarct with extensive penumbra, 4 mm right supraclinoid ICA aneurysm. Widely patent cervical carotid and vertebral arteries.  11/5 postprocedure CT head: Hypodensity in the right lateral temporal lobe now shows mild hemorrhage or contrast enhancement. This is most consistent with acute infarct. There has been interval stenting of the right middle cerebral artery.   Micro Data:  11/5 MRSA PCR negative  Antimicrobials:    Interim history/subjective:  Right femoral sheath was taken out by IR.  Repeat CT scan last night did not show intraparenchymal/subarachnoid hemorrhage.   Objective   Blood pressure 117/78, pulse 69, temperature 99.6 F (37.6 C), temperature source Oral, resp. rate 19, SpO2 100 %.    Vent Mode: PRVC FiO2 (%):  [40 %-100 %] 40 % Set Rate:  [18 bmp] 18 bmp Vt Set:  [450 mL] 450 mL PEEP:  [5 cmH20] 5 cmH20 Plateau Pressure:  [12 cmH20-14 cmH20] 14 cmH20   Intake/Output Summary (Last 24 hours) at 10/06/2020 1047 Last  data filed at 10/06/2020 1000 Gross per 24 hour  Intake 2106.45 ml  Output 950 ml  Net 1156.45 ml   There were no vitals filed for this visit.  Examination:   Physical exam: General: Middle-aged male, orally intubated HEAENT: [place]/AT, eyes anicteric.  ETT Neuro: Sedated, not following commands.  Eyes are closed.  Pupils 3 mm bilateral reactive to light.  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 and right MCA stent placement by IR with TICI 3 Hypertension Hyperlipidemia Acute respiratory insufficiency post procedure  Continue neuro check every hour Patient just had right femoral sheath removed, will keep him flat for 4 hours He is a scheduled for MRI brain around 11 today, post MRI will stop sedation and try to extubate him He is a status post cangrelor infusion Continue aspirin and Brilinta Continue statin Continue secondary stroke prophylaxis MRI brain and echocardiogram Patient's A1c is 5.8 LDL is 85 with triglyceride 277 Repeat CT scan last night did not show any intraparenchymal/subarachnoid hemorrhage, likely it was contrast extravasation post procedure on initial CT Maintain SBP goal 120-140 Best practice:  Diet: NPO Pain/Anxiety/Delirium protocol (if indicated): Propofol VAP protocol (if indicated): Yes DVT prophylaxis: SCD GI prophylaxis: Pepcid Glucose control: SSI Mobility: Bedrest Code Status: Full code Family Communication: Per primary team Disposition: ICU  Labs   CBC: Recent Labs  Lab 10/07/2020 1246 10/23/2020 2024  10/06/20 0522  WBC 7.1  --  10.7*  NEUTROABS 5.2  --  8.2*  HGB 16.5 14.6 13.5  HCT 49.1 43.0 40.4  MCV 89.4  --  89.2  PLT 291  --  232    Basic Metabolic  Panel: Recent Labs  Lab 10/13/2020 1246 10/24/2020 2024 10/06/20 0522  NA 142 139 139  K 4.3 3.5 3.6  CL 102  --  106  CO2 29  --  22  GLUCOSE 110*  --  112*  BUN 12  --  10  CREATININE 0.89  --  1.02  CALCIUM 8.6*  --  8.2*   GFR: Estimated Creatinine Clearance: 57.9 mL/min (by C-G formula based on SCr of 1.02 mg/dL). Recent Labs  Lab 10/12/2020 1246 10/06/20 0522  WBC 7.1 10.7*    Liver Function Tests: Recent Labs  Lab 10/23/2020 1246  AST 24  ALT 32  ALKPHOS 113  BILITOT 0.9  PROT 7.3  ALBUMIN 4.3   No results for input(s): LIPASE, AMYLASE in the last 168 hours. No results for input(s): AMMONIA in the last 168 hours.  ABG    Component Value Date/Time   PHART 7.415 10/24/2020 2024   PCO2ART 38.0 10/08/2020 2024   PO2ART 205 (H) 10/28/2020 2024   HCO3 24.4 10/08/2020 2024   TCO2 26 10/03/2020 2024   O2SAT 100.0 10/06/2020 2024     Coagulation Profile: Recent Labs  Lab 10/21/2020 1246  INR 0.9    Cardiac Enzymes: No results for input(s): CKTOTAL, CKMB, CKMBINDEX, TROPONINI in the last 168 hours.  HbA1C: Hgb A1c MFr Bld  Date/Time Value Ref Range Status  10/06/2020 05:22 AM 5.8 (H) 4.8 - 5.6 % Final    Comment:    (NOTE) Pre diabetes:          5.7%-6.4%  Diabetes:              >6.4%  Glycemic control for   <7.0% adults with diabetes     CBG: No results for input(s): GLUCAP in the last 168 hours.  Review of Systems:   Unable to perform review of system as patient is deeply sedated at this time  Past Medical History  He,  has a past medical history of Gout and Hypertension.   Surgical History    Past Surgical History:  Procedure Laterality Date  . PARTIAL GASTRECTOMY  1990   done in [place] for PUD  . PROSTATE BIOPSY  ~ 2005   negative for cancer (done in [place])     Social History   reports that he has never smoked. He has never used smokeless tobacco. He reports that he does not drink alcohol and does not use drugs.   Family History    His family history includes Diabetes in his father and mother.   Allergies No Known Allergies   Home Medications  Prior to Admission medications   Medication Sig Start Date End Date Taking? Authorizing Provider  allopurinol (ZYLOPRIM) 100 MG tablet Take 3 tablets (300 mg total) by mouth daily. 02/17/18   Reubin Milan, MD  amLODipine (NORVASC) 10 MG tablet Take 1 tablet (10 mg total) by mouth daily. 02/17/18   Reubin Milan, MD  meclizine (ANTIVERT) 25 MG tablet Take 1 tablet (25 mg total) by mouth 3 (three) times daily as needed for dizziness. 03/11/20   Lutricia Feil, PA-C  meloxicam (MOBIC) 15 MG tablet Take 1 tablet (15 mg total) by mouth daily. 03/09/18   Bari Edward  H, MD  tamsulosin (FLOMAX) 0.4 MG CAPS capsule Take 1 capsule (0.4 mg total) by mouth daily after supper. 03/09/18   Reubin Milan, MD  tizanidine (ZANAFLEX) 2 MG capsule Take 2 mg by mouth 3 (three) times daily.    [provider]     Total critical care time: 36 minutes  Performed by: Cheri Fowler   Critical care time was exclusive of separately billable procedures and treating other patients.   Critical care was necessary to treat or prevent imminent or life-threatening deterioration.   Critical care was time spent personally by me on the following activities: development of treatment plan with patient and/or surrogate as well as nursing, discussions with consultants, evaluation of patient's response to treatment, examination of patient, obtaining history from patient or surrogate, ordering and performing treatments and interventions, ordering and review of laboratory studies, ordering and review of radiographic studies, pulse oximetry and re-evaluation of patient's condition.   Cheri Fowler MD Critical care physician [facility] [place] [facility]  Pager: [phone] Mobile: [phone]

## 2020-10-06 NOTE — Progress Notes (Signed)
STROKE TEAM PROGRESS NOTE   HISTORY OF PRESENT ILLNESS (per record) Mark May is a 64 y.o. Asian male with PMH of hypertension, BPH, sciatica, stomach ulcer status post surgery in the past presented to ER for acute onset left-sided weakness, left facial droop and left hemianopia.  Per patient, he woke up 3 AM to go to bathroom.  Initially, he was ambulating to bathroom without problem, however in the bathroom, he started to have difficulty on the left side, feeding numbness on the left arm and leg, not able to hold paper tissue on the left hand.  He told his wife, they felt that he probably sleep in wrong position.  Did not pay too much attention.  Patient got up around 8 AM, continue have left arm weakness and also had left leg weakness along with numbness.  He did not tell his son until 51 AM.  Son brought him to [facility] for evaluation.  Plan CT showed no acute abnormality, chronic right temporal lobe encephalomalacia.  CT head and neck showed right M1 high-grade stenosis versus near occlusive thrombosis.  CT perfusion showed large penumbra.  Patient transferred to [facility] for thrombectomy.  Per patient and son, he has no history of stroke, but did have intermittent left-sided arm and leg numbness.  They contributed to sciatica.  Patient stated that he has hypertension but did not take any medication.  He had peptic ulcer disease status post surgery in the past, without any problem since.  LSN: 3 AM today tPA Given: No: Outside window   INTERVAL HISTORY His nurse is at bedside. Stable.     OBJECTIVE Vitals:   10/06/20 0500 10/06/20 0530 10/06/20 0600 10/06/20 0630  BP: 121/69 126/66 115/60 112/66  Pulse: 66 66 63 65  Resp:      Temp:      TempSrc:      SpO2: 100% 100% 100% 100%    CBC:  Recent Labs  Lab 10/26/2020 1246 10/10/2020 1246 10/27/2020 2024 10/06/20 0522  WBC 7.1  --   --  10.7*  NEUTROABS 5.2  --   --  8.2*  HGB 16.5   < > 14.6 13.5  HCT  49.1   < > 43.0 40.4  MCV 89.4  --   --  89.2  PLT 291  --   --  232   < > = values in this interval not displayed.    Basic Metabolic Panel:  Recent Labs  Lab 10/24/2020 1246 10/25/2020 1246 10/30/2020 2024 10/06/20 0522  NA 142   < > 139 139  K 4.3   < > 3.5 3.6  CL 102  --   --  106  CO2 29  --   --  22  GLUCOSE 110*  --   --  112*  BUN 12  --   --  10  CREATININE 0.89  --   --  1.02  CALCIUM 8.6*  --   --  8.2*   < > = values in this interval not displayed.    Lipid Panel:     Component Value Date/Time   CHOL 173 10/06/2020 0522   CHOL 216 (H) 03/09/2018 1620   TRIG 277 (H) 10/06/2020 0522   TRIG 275 (H) 10/06/2020 0522   HDL 33 (L) 10/06/2020 0522   HDL 59 03/09/2018 1620   CHOLHDL 5.2 10/06/2020 0522   VLDL 55 (H) 10/06/2020 0522   LDLCALC 85 10/06/2020 0522   LDLCALC 136 (  H) 03/09/2018 1620   HgbA1c:  Lab Results  Component Value Date   HGBA1C 5.8 (H) 10/06/2020   Urine Drug Screen: No results found for: LABOPIA, COCAINSCRNUR, LABBENZ, AMPHETMU, THCU, LABBARB  Alcohol Level No results found for: ETH  IMAGING  CT Angio Head W or Wo Contrast CT Angio Neck W and/or Wo Contrast CT CEREBRAL PERFUSION W CONTRAST 10/28/2020 IMPRESSION:  1. 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  2. Right MCA infarct with extensive penumbra.  3. 4 mm right supraclinoid ICA aneurysm.  4. Widely patent cervical carotid and vertebral arteries.  5. Aortic Atherosclerosis (ICD10-I70.0).   DG Abd 1 View 10/02/2020 IMPRESSION:  Gastric catheter within the stomach kinked at the proximal side port. This could be withdrawn slightly S necessary.   CT HEAD WO CONTRAST 10/25/2020 IMPRESSION:  Right temporal lobe hypodensity compatible with acute infarct. Decreased hyperdensity within this stroke compared with 4 hours previously compatible with contrast staining rather than hemorrhage. Small area of acute infarct in the right parietal lobe now evident. Right MCA stent.    CT HEAD WO CONTRAST 10/04/2020 IMPRESSION:  Hypodensity in the right lateral temporal lobe now shows mild hemorrhage or contrast enhancement. This is most consistent with acute infarct. There has been interval stenting of the right middle cerebral artery.   CT HEAD WO CONTRAST 10/25/2020 IMPRESSION:  1. No acute abnormality.  2. Minimal diffuse cerebral and cerebellar atrophy.  3. Mild chronic small vessel white matter ischemic changes in both cerebral hemispheres.   Transthoracic Echocardiogram  00/00/2021 Pending  ECG - SR rate 70 BPM. (See cardiology reading for complete details)  PHYSICAL EXAM Blood pressure 112/66, pulse 65, temperature 99 F (37.2 C), temperature source Oral, resp. rate 18, SpO2 100 %.   Intubated, sedated. By report only mandarin speaking. Groin sheath still in place. PERRL, grimaces to stim, opens eyes spontaneously, conjugate midline gaze, does not track or blink to threat, + cough/gag, Cannot assess facial symmetry or tongue movement due to ETT, Purposeful and spontaneous movements on the right. W/d to pain LEs and right arm, no movement left arm.  Gait deferred.     ASSESSMENT/PLAN Mr. Mark May is a 64 y.o. male with history of hypertension, BPH, sciatica, stomach ulcer status post surgery in the past presented to ER at [facility] for acute onset left-sided weakness, left facial droop and left hemianopia.  CT head and neck showed right M1 high-grade stenosis versus near occlusive thrombosis.  CT perfusion showed large penumbra.  Patient transferred to [facility] for thrombectomy.  He did not receive IV t-PA due to late presentation (>4.5 hours from time of onset). IR - complete occlusion of RT MCA M 1 seg - thrombectomy ->TICI 3 revascularization with placement of rescue stent m4 mm x 24 mm Atlas for reocclusion with restoration of TICI 3 .  Stroke: Rt lateral temporal infarct - embolic - source unknown.  Code Stroke CT Head - not  ordered   CT Head - No acute abnormality. Minimal diffuse cerebral and cerebellar atrophy. Mild chronic small vessel white matter ischemic changes in both cerebral hemispheres.   CT head - Hypodensity in the right lateral temporal lobe now shows mild hemorrhage or contrast enhancement. This is most consistent with acute infarct. There has been interval stenting of the right middle cerebral artery.   CT Head - Right temporal lobe hypodensity compatible with acute infarct. Decreased hyperdensity within this stroke compared with 4 hours previously compatible with contrast  staining rather than hemorrhage. Small area of acute infarct in the right parietal lobe now evident. Right MCA stent.  MRI head - pending  MRA head - pending   CTA H&N - Severe distal right M1 stenosis or subocclusive thrombus. 4 mm right supraclinoid ICA aneurysm. Widely patent cervical carotid and vertebral arteries.   CT Perfusion - Right MCA infarct with extensive penumbra.   Carotid Doppler - CTA neck ordered - carotid dopplers not indicated.  2D Echo - pending  Loyal Jacobson Virus 2 - negative  LDL - 85  HgbA1c - 5.8  UDS - not ordered  VTE prophylaxis - SCDs Diet  Diet Order            Diet NPO time specified  Diet effective now                  No antithrombotic prior to admission, now on aspirin 81 mg daily and Brilinta (ticagrelor) 90 mg bid bolused with cangrelor with a slow 4 hr infusion.  Patient will be counseled to be compliant with his antithrombotic medications  Ongoing aggressive stroke risk factor management  Therapy recommendations:  pending  Disposition:  Pending  Hypertension  Home BP meds: Amlodipine  Current BP meds: Cleviprex prn  Stable . Initial SBP goal 120 - 140 s/p thrombectomy . Long-term BP goal normotensive  Hyperlipidemia  Home Lipid lowering medication:   LDL 85, goal < 70  Current lipid lowering medication when can swallow: NPO   Continue statin at  discharge  Other Stroke Risk Factors  Advanced age  Other Active Problems  Code status - Full code  Pt speaks Madarin    Aortic Atherosclerosis (ICD10-I70.0).   4 mm right supraclinoid ICA aneurysm.  Hospital day # 1   This patient is critically ill and at significant risk of neurological worsening, death and care requires constant monitoring of vital signs, hemodynamics,respiratory and cardiac monitoring,review of multiple databases, neurological assessment, discussion with family, other specialists and medical decision making of high complexity.I  Personally examined patient and images, and have participated in and made any corrections needed to history, physical, neuro exam,assessment and plan as stated above.  I have personally obtained the history, evaluated lab date, reviewed imaging studies and agree with radiology interpretations.    Naomie Dean, MD Stroke Neurology  I spent 30 minutes of neurocritical care time in the care of this patient. This included prechart review, lab review, study review, order entry, electronic health record documentation, patient education on the different diagnostic and therapeutic options, counseling and coordination of care, risks and benefits of management, compliance, or risk factor reduction     To contact Stroke Continuity provider, please refer to [email or web address]. After hours, contact General Neurology

## 2020-10-06 NOTE — Anesthesia Postprocedure Evaluation (Signed)
Anesthesia Post Note  Patient: Mark May  Procedure(s) Performed: IR WITH ANESTHESIA (N/A )     Patient location during evaluation: ICU Anesthesia Type: General Level of consciousness: sedated Pain management: pain level controlled Vital Signs Assessment: post-procedure vital signs reviewed and stable Respiratory status: patient remains intubated per anesthesia plan Cardiovascular status: stable Postop Assessment: no apparent nausea or vomiting Anesthetic complications: no   No complications documented.             Mark May Mark May

## 2020-10-06 NOTE — Progress Notes (Signed)
Referring Physician(s): Dr Jerel ShepherdJ Xu  Supervising Physician: Julieanne Cottoneveshwar, Sanjeev  [facility]:  [facility]  Chief Complaint:  CVA R MCA occlusion  Subjective:  11/5 NIR procedure:  complete revascularization of near complete occlusion of RT MCA M 1 seg with  1 pass with tiger 21 retriever and aspiration achieving a TICI 3 revascularization .placement of rescue stent m4 mm x 24 mm Atlas for reocclusion with restoration of TICI 3 .  Intubated and sedated this am Rt groin sheath just removed-- hemostasis and groin soft  No movement at this time    Allergies: Patient has no known allergies.  Medications: Prior to Admission medications   Medication Sig Start Date End Date Taking? Authorizing Provider  allopurinol (ZYLOPRIM) 100 MG tablet Take 3 tablets (300 mg total) by mouth daily. 02/17/18   Reubin MilanBerglund, Laura H, MD  amLODipine (NORVASC) 10 MG tablet Take 1 tablet (10 mg total) by mouth daily. 02/17/18   Reubin MilanBerglund, Laura H, MD  meclizine (ANTIVERT) 25 MG tablet Take 1 tablet (25 mg total) by mouth 3 (three) times daily as needed for dizziness. 03/11/20   Lutricia Feiloemer, William P, PA-C  meloxicam (MOBIC) 15 MG tablet Take 1 tablet (15 mg total) by mouth daily. 03/09/18   Reubin MilanBerglund, Laura H, MD  tamsulosin (FLOMAX) 0.4 MG CAPS capsule Take 1 capsule (0.4 mg total) by mouth daily after supper. 03/09/18   Reubin MilanBerglund, Laura H, MD  tizanidine (ZANAFLEX) 2 MG capsule Take 2 mg by mouth 3 (three) times daily.    [provider]     Vital Signs: BP 110/67   Pulse 64   Temp 99 F (37.2 C) (Oral)   Resp 19   SpO2 100%   Physical Exam Constitutional:      Comments: Intubated Sedated   Skin:    General: Skin is warm.     Comments: Rt groin clean and dry NO bleeding No hematoma Rt foot 2+ pulses       Imaging: CT Angio Head W or Wo Contrast  Result Date: 10/23/2020 CLINICAL DATA:  Left-sided numbness and weakness and right gaze preference. EXAM: CT ANGIOGRAPHY HEAD AND NECK CT  PERFUSION BRAIN TECHNIQUE: Multidetector CT imaging of the head and neck was performed using the standard protocol during bolus administration of intravenous contrast. Multiplanar CT image reconstructions and MIPs were obtained to evaluate the vascular anatomy. Carotid stenosis measurements (when applicable) are obtained utilizing NASCET criteria, using the distal internal carotid diameter as the denominator. Multiphase CT imaging of the brain was performed following IV bolus contrast injection. Subsequent parametric perfusion maps were calculated using RAPID software. CONTRAST:  100mL OMNIPAQUE IOHEXOL 350 MG/ML SOLN COMPARISON:  None. FINDINGS: CTA NECK FINDINGS Aortic arch: Normal variant aortic arch branching pattern with common origin of the brachiocephalic and left common carotid arteries. Mild arch atherosclerosis without arch vessel origin stenosis. Right carotid system: Patent without evidence of stenosis, dissection, or significant atherosclerosis. Left carotid system: Patent without evidence of stenosis, dissection, or significant atherosclerosis. Vertebral arteries: Patent without evidence of stenosis, dissection, or significant atherosclerosis. Strongly dominant left vertebral artery. Skeleton: Old medial left orbital fracture. 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 Chronic appearing left mastoid air cell opacification. Mild disc degeneration at C4-5. Other neck: No evidence of cervical lymphadenopathy or mass. Upper chest: Clear lung apices. Review of the MIP images confirms the above findings CTA HEAD FINDINGS Anterior circulation: The internal carotid arteries are patent from skull base  to carotid termini with atherosclerotic plaque resulting in mild left cavernous segment stenosis. A posteriorly projecting aneurysm arising from the distal right supraclinoid ICA measures 4 mm. ACAs and MCAs are patent without evidence of a significant A1 or left  M1 stenosis. There is severe irregular narrowing of the distal right M1 segment over a length of 7 mm. No M2 occlusion is identified. Posterior circulation: The intracranial vertebral arteries are patent to the basilar. Patent AICA and SCA origins are seen bilaterally. The basilar artery is widely patent. There are small right and diminutive or absent left posterior communicating arteries. Both PCAs are patent without evidence of a significant proximal stenosis on the right. There is a moderate mid left P2 stenosis. No aneurysm is identified. Venous sinuses: As permitted by contrast timing, patent. Anatomic variants: None. Review of the MIP images confirms the above findings CT Brain Perfusion Findings: ASPECTS: 8 CBF (<30%) Volume: 20 mL Perfusion (Tmax>6.0s) volume: 151 mL Mismatch Volume: 131 mL Infarction Location: Core infarct in the right parietal and temporal lobes (with underlying chronic encephalomalacia in the anterior and lateral right temporal lobe as well) with penumbra throughout the majority of the right MCA territory. IMPRESSION: 1. 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 2. Right MCA infarct with extensive penumbra. 3. 4 mm right supraclinoid ICA aneurysm. 4. Widely patent cervical carotid and vertebral arteries. 5. Aortic Atherosclerosis (ICD10-I70.0). These results were called by telephone at the time of interpretation on 10/30/2020 at 2:45 p.m. to Dr. Wilford Corner, who verbally acknowledged these results. Electronically Signed   By: Sebastian Ache M.D.   On: 10/13/2020 15:09   DG Abd 1 View  Result Date: 10/13/2020 CLINICAL DATA:  Check gastric catheter placement EXAM: ABDOMEN - 1 VIEW COMPARISON:  None. FINDINGS: Gastric catheter is noted extending into the stomach. It appears coiled upon itself within the distal stomach with kinking at the proximal side port. This could be withdrawn slightly as clinically indicated. Contrast from recent arteriography is noted. IMPRESSION: Gastric  catheter within the stomach kinked at the proximal side port. This could be withdrawn slightly S necessary. Electronically Signed   By: Alcide Clever M.D.   On: 10/17/2020 20:21   CT HEAD WO CONTRAST  Result Date: 10/03/2020 CLINICAL DATA:  Stroke follow-up.  Right MCA recanalization today. EXAM: CT HEAD WITHOUT CONTRAST TECHNIQUE: Contiguous axial images were obtained from the base of the skull through the vertex without intravenous contrast. COMPARISON:  CT head approximately 4 hours prior to the current study. FINDINGS: Brain: Right lateral temporal lobe hypodensity unchanged in size. Decrease hyperdensity within this likely due to resolving contrast staining rather than hemorrhage. Small area of hypodensity in the right parietal cortex has developed since the prior study compatible with acute infarct. Ventricle size normal. No midline shift. Mild chronic microvascular ischemic change in the white matter. Vascular: Right MCA stent.  Negative for hyperdense vessel Skull: Negative Sinuses/Orbits: Mucosal edema in the paranasal sinuses. The patient is intubated with an NG tube in place. Other: None IMPRESSION: Right temporal lobe hypodensity compatible with acute infarct. Decreased hyperdensity within this stroke compared with 4 hours previously compatible with contrast staining rather than hemorrhage. Small area of acute infarct in the right parietal lobe now evident. Right MCA stent. Electronically Signed   By: Marlan Palau M.D.   On: 10/03/2020 22:16   CT HEAD WO CONTRAST  Result Date: 10/04/2020 CLINICAL DATA:  Stroke.  Post intervention. EXAM: CT HEAD WITHOUT CONTRAST TECHNIQUE: Contiguous axial images were obtained  from the base of the skull through the vertex without intravenous contrast. COMPARISON:  CT angio head and neck 10/15/20 FINDINGS: Brain: Hypodensity in the right lateral temporal lobe now shows mild hyperdensity which may be hemorrhage or contrast enhancement. If this is blood, this is a  mild amount of blood. This therefore likely was an area of acute infarct. There has been interval stenting of the right middle cerebral artery. No other areas of acute hemorrhage or infarct. Patchy white matter hypodensity bilaterally compatible chronic microvascular ischemia. Ventricle size normal. Vascular: Normal vascular enhancement following angiography. Skull: Negative Sinuses/Orbits: Mucosal edema paranasal sinuses with near complete opacification right maxillary sinus. Negative orbit Other: None IMPRESSION: Hypodensity in the right lateral temporal lobe now shows mild hemorrhage or contrast enhancement. This is most consistent with acute infarct. There has been interval stenting of the right middle cerebral artery. These results were called by telephone at the time of interpretation on October 15, 2020 at 6:28 pm to provider Bhagat, who verbally acknowledged these results. T Electronically Signed   By: Marlan Palau M.D.   On: 2020/10/15 18:26   CT HEAD WO CONTRAST  Result Date: 2020/10/15 CLINICAL DATA:  Dizziness. Left upper extremity weakness. Headache. EXAM: CT HEAD WITHOUT CONTRAST TECHNIQUE: Contiguous axial images were obtained from the base of the skull through the vertex without intravenous contrast. COMPARISON:  None. FINDINGS: Brain: Minimally enlarged ventricles and subarachnoid spaces. Mild patchy white matter low density in both cerebral hemispheres. No intracranial hemorrhage, mass lesion or CT evidence of acute infarction. Vascular: No hyperdense vessel or unexpected calcification. Skull: Normal. Negative for fracture or focal lesion. Sinuses/Orbits: Unremarkable. Other: None. IMPRESSION: 1. No acute abnormality. 2. Minimal diffuse cerebral and cerebellar atrophy. 3. Mild chronic small vessel white matter ischemic changes in both cerebral hemispheres. Electronically Signed   By: Beckie Salts M.D.   On: 10/15/2020 13:01   CT Angio Neck W and/or Wo Contrast  Result Date: 10/15/2020 CLINICAL  DATA:  Left-sided numbness and weakness and right gaze preference. EXAM: CT ANGIOGRAPHY HEAD AND NECK CT PERFUSION BRAIN TECHNIQUE: Multidetector CT imaging of the head and neck was performed using the standard protocol during bolus administration of intravenous contrast. Multiplanar CT image reconstructions and MIPs were obtained to evaluate the vascular anatomy. Carotid stenosis measurements (when applicable) are obtained utilizing NASCET criteria, using the distal internal carotid diameter as the denominator. Multiphase CT imaging of the brain was performed following IV bolus contrast injection. Subsequent parametric perfusion maps were calculated using RAPID software. CONTRAST:  OMNIPAQUE IOHEXOL 350 MG/ML SOLN COMPARISON:  None. FINDINGS: CTA NECK FINDINGS Aortic arch: Normal variant aortic arch branching pattern with common origin of the brachiocephalic and left common carotid arteries. Mild arch atherosclerosis without arch vessel origin stenosis. Right carotid system: Patent without evidence of stenosis, dissection, or significant atherosclerosis. Left carotid system: Patent without evidence of stenosis, dissection, or significant atherosclerosis. Vertebral arteries: Patent without evidence of stenosis, dissection, or significant atherosclerosis. Strongly dominant left vertebral artery. Skeleton: Old medial left orbital fracture. 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 Chronic appearing left mastoid air cell opacification. Mild disc degeneration at C4-5. Other neck: No evidence of cervical lymphadenopathy or mass. Upper chest: Clear lung apices. Review of the MIP images confirms the above findings CTA HEAD FINDINGS Anterior circulation: The internal carotid arteries are patent from skull base to carotid termini with atherosclerotic plaque resulting in mild left cavernous segment stenosis. A posteriorly projecting aneurysm arising from the distal  right supraclinoid ICA measures 4 mm. ACAs and MCAs are patent without evidence of a significant A1 or left M1 stenosis. There is severe irregular narrowing of the distal right M1 segment over a length of 7 mm. No M2 occlusion is identified. Posterior circulation: The intracranial vertebral arteries are patent to the basilar. Patent AICA and SCA origins are seen bilaterally. The basilar artery is widely patent. There are small right and diminutive or absent left posterior communicating arteries. Both PCAs are patent without evidence of a significant proximal stenosis on the right. There is a moderate mid left P2 stenosis. No aneurysm is identified. Venous sinuses: As permitted by contrast timing, patent. Anatomic variants: None. Review of the MIP images confirms the above findings CT Brain Perfusion Findings: ASPECTS: 8 CBF (<30%) Volume: 20 mL Perfusion (Tmax>6.0s) volume: 151 mL Mismatch Volume: 131 mL Infarction Location: Core infarct in the right parietal and temporal lobes (with underlying chronic encephalomalacia in the anterior and lateral right temporal lobe as well) with penumbra throughout the majority of the right MCA territory. IMPRESSION: 1. 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 2. Right MCA infarct with extensive penumbra. 3. 4 mm right supraclinoid ICA aneurysm. 4. Widely patent cervical carotid and vertebral arteries. 5. Aortic Atherosclerosis (ICD10-I70.0). These results were called by telephone at the time of interpretation on 10/27/2020 at 2:45 p.m. to Dr. Wilford Corner, who verbally acknowledged these results. Electronically Signed   By: Sebastian Ache M.D.   On: 10/01/2020 15:09   CT CEREBRAL PERFUSION W CONTRAST  Result Date: 10/14/2020 CLINICAL DATA:  Left-sided numbness and weakness and right gaze preference. EXAM: CT ANGIOGRAPHY HEAD AND NECK CT PERFUSION BRAIN TECHNIQUE: Multidetector CT imaging of the head and neck was performed using the standard protocol during bolus  administration of intravenous contrast. Multiplanar CT image reconstructions and MIPs were obtained to evaluate the vascular anatomy. Carotid stenosis measurements (when applicable) are obtained utilizing NASCET criteria, using the distal internal carotid diameter as the denominator. Multiphase CT imaging of the brain was performed following IV bolus contrast injection. Subsequent parametric perfusion maps were calculated using RAPID software. CONTRAST:  OMNIPAQUE IOHEXOL 350 MG/ML SOLN COMPARISON:  None. FINDINGS: CTA NECK FINDINGS Aortic arch: Normal variant aortic arch branching pattern with common origin of the brachiocephalic and left common carotid arteries. Mild arch atherosclerosis without arch vessel origin stenosis. Right carotid system: Patent without evidence of stenosis, dissection, or significant atherosclerosis. Left carotid system: Patent without evidence of stenosis, dissection, or significant atherosclerosis. Vertebral arteries: Patent without evidence of stenosis, dissection, or significant atherosclerosis. Strongly dominant left vertebral artery. Skeleton: Old medial left orbital fracture. 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 Chronic appearing left mastoid air cell opacification. Mild disc degeneration at C4-5. Other neck: No evidence of cervical lymphadenopathy or mass. Upper chest: Clear lung apices. Review of the MIP images confirms the above findings CTA HEAD FINDINGS Anterior circulation: The internal carotid arteries are patent from skull base to carotid termini with atherosclerotic plaque resulting in mild left cavernous segment stenosis. A posteriorly projecting aneurysm arising from the distal right supraclinoid ICA measures 4 mm. ACAs and MCAs are patent without evidence of a significant A1 or left M1 stenosis. There is severe irregular narrowing of the distal right M1 segment over a length of 7 mm. No M2 occlusion is  identified. Posterior circulation: The intracranial vertebral arteries are patent to the basilar. Patent AICA and SCA origins are seen bilaterally. The basilar artery is  widely patent. There are small right and diminutive or absent left posterior communicating arteries. Both PCAs are patent without evidence of a significant proximal stenosis on the right. There is a moderate mid left P2 stenosis. No aneurysm is identified. Venous sinuses: As permitted by contrast timing, patent. Anatomic variants: None. Review of the MIP images confirms the above findings CT Brain Perfusion Findings: ASPECTS: 8 CBF (<30%) Volume: 20 mL Perfusion (Tmax>6.0s) volume: 151 mL Mismatch Volume: 131 mL Infarction Location: Core infarct in the right parietal and temporal lobes (with underlying chronic encephalomalacia in the anterior and lateral right temporal lobe as well) with penumbra throughout the majority of the right MCA territory. IMPRESSION: 1. 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 2. Right MCA infarct with extensive penumbra. 3. 4 mm right supraclinoid ICA aneurysm. 4. Widely patent cervical carotid and vertebral arteries. 5. Aortic Atherosclerosis (ICD10-I70.0). These results were called by telephone at the time of interpretation on 10/12/2020 at 2:45 p.m. to Dr. Wilford Corner, who verbally acknowledged these results. Electronically Signed   By: Sebastian Ache M.D.   On: 10/24/2020 15:09    Labs:  CBC: Recent Labs    10/07/2020 1246 10/30/2020 2024 10/06/20 0522  WBC 7.1  --  10.7*  HGB 16.5 14.6 13.5  HCT 49.1 43.0 40.4  PLT 291  --  232    COAGS: Recent Labs    10/20/2020 1246  INR 0.9  APTT 25    BMP: Recent Labs    10/30/2020 1246 10/20/2020 2024 10/06/20 0522  NA 142 139 139  K 4.3 3.5 3.6  CL 102  --  106  CO2 29  --  22  GLUCOSE 110*  --  112*  BUN 12  --  10  CALCIUM 8.6*  --  8.2*  CREATININE 0.89  --  1.02  GFRNONAA >60  --  >60    LIVER FUNCTION TESTS: Recent Labs     10/25/2020 1246  BILITOT 0.9  AST 24  ALT 32  ALKPHOS 113  PROT 7.3  ALBUMIN 4.3    Assessment and Plan:  CVA Rt MCA revascularization in IR with Dr Corliss Skains Rt groin sheath - removed-- soft and no hematoma at groin Will follow Plan per Dr Roda Shutters  Electronically Signed: Robet Leu, PA-C 10/06/2020, 9:35 AM   I spent a total of 15 Minutes at the the patient's bedside AND on the patient's hospital floor or unit, greater than 50% of which was counseling/coordinating care for Rt MCA revascularization    Patient ID: Mark May, male   DOB: August 11, 1956, 64 y.o.   MRN: [id number]

## 2020-10-06 NOTE — Progress Notes (Signed)
PT Cancellation Note  Patient Details Name: Mark May MRN: [id number] DOB: 03/04/1956   Cancelled Treatment:    Reason Eval/Treat Not Completed: Medical issues which prohibited therapy;Patient not medically ready.  Pt to remain on bedrest today.  Will see as able 11/7.   Eliseo Gum Amadi Yoshino 10/06/2020, 1:48 PM

## 2020-10-06 NOTE — Progress Notes (Signed)
°  Echocardiogram 2D Echocardiogram has been performed.  Celene Skeen 10/06/2020, 2:51 PM

## 2020-10-06 NOTE — Progress Notes (Signed)
SLP Cancellation Note  Patient Details Name: Mark May MRN: [id number] DOB: 1956/01/23   Cancelled treatment:       Reason Eval/Treat Not Completed: Medical issues which prohibited therapy. Patient intubated. Will check for readiness next date schedule permitting.   Angela Nevin, MA, CCC-SLP Speech Therapy

## 2020-10-07 ENCOUNTER — Encounter (HOSPITAL_COMMUNITY): Payer: Self-pay | Admitting: Radiology

## 2020-10-07 ENCOUNTER — Inpatient Hospital Stay (HOSPITAL_COMMUNITY): Payer: 59

## 2020-10-07 DIAGNOSIS — I639 Cerebral infarction, unspecified: Secondary | ICD-10-CM | POA: Diagnosis not present

## 2020-10-07 DIAGNOSIS — I6601 Occlusion and stenosis of right middle cerebral artery: Secondary | ICD-10-CM | POA: Diagnosis not present

## 2020-10-07 LAB — GLUCOSE, CAPILLARY: Glucose-Capillary: 141 mg/dL — ABNORMAL HIGH (ref 70–99)

## 2020-10-07 LAB — CBC
HCT: 40.4 % (ref 39.0–52.0)
Hemoglobin: 13.9 g/dL (ref 13.0–17.0)
MCH: 30.6 pg (ref 26.0–34.0)
MCHC: 34.4 g/dL (ref 30.0–36.0)
MCV: 89 fL (ref 80.0–100.0)
Platelets: 238 10*3/uL (ref 150–400)
RBC: 4.54 MIL/uL (ref 4.22–5.81)
RDW: 13.2 % (ref 11.5–15.5)
WBC: 11 10*3/uL — ABNORMAL HIGH (ref 4.0–10.5)
nRBC: 0 % (ref 0.0–0.2)

## 2020-10-07 LAB — BASIC METABOLIC PANEL
Anion gap: 9 (ref 5–15)
BUN: 7 mg/dL — ABNORMAL LOW (ref 8–23)
CO2: 21 mmol/L — ABNORMAL LOW (ref 22–32)
Calcium: 7.7 mg/dL — ABNORMAL LOW (ref 8.9–10.3)
Chloride: 109 mmol/L (ref 98–111)
Creatinine, Ser: 0.81 mg/dL (ref 0.61–1.24)
GFR, Estimated: >60 mL/min (ref >60)
Glucose, Bld: 141 mg/dL — ABNORMAL HIGH (ref 70–99)
Potassium: 3 mmol/L — ABNORMAL LOW (ref 3.5–5.1)
Sodium: 139 mmol/L (ref 135–145)

## 2020-10-07 LAB — TRIGLYCERIDES: Triglycerides: 396 mg/dL — ABNORMAL HIGH (ref <150)

## 2020-10-07 MED ORDER — POTASSIUM CHLORIDE 10 MEQ/100ML IV SOLN
10.0000 meq | INTRAVENOUS | Status: AC
  Administered 2020-10-07 (×5): 10 meq via INTRAVENOUS
  Filled 2020-10-07 (×6): qty 100

## 2020-10-07 MED ORDER — ORAL CARE MOUTH RINSE
15.0000 mL | Freq: Two times a day (BID) | OROMUCOSAL | Status: DC
  Administered 2020-10-08 – 2020-10-18 (×22): 15 mL via OROMUCOSAL

## 2020-10-07 MED ORDER — CHLORHEXIDINE GLUCONATE 0.12 % MT SOLN
15.0000 mL | Freq: Two times a day (BID) | OROMUCOSAL | Status: DC
  Administered 2020-10-07 – 2020-10-18 (×21): 15 mL via OROMUCOSAL
  Filled 2020-10-07 (×20): qty 15

## 2020-10-07 MED ORDER — FUROSEMIDE 10 MG/ML IJ SOLN
40.0000 mg | Freq: Once | INTRAMUSCULAR | Status: AC
  Administered 2020-10-07: 40 mg via INTRAVENOUS
  Filled 2020-10-07: qty 4

## 2020-10-07 MED ORDER — POTASSIUM CHLORIDE 20 MEQ/15ML (10%) PO SOLN: 20.0000 meq | Freq: Three times a day (TID) | ORAL | Status: DC

## 2020-10-07 MED ORDER — SENNOSIDES-DOCUSATE SODIUM 8.6-50 MG PO TABS: 1.0000 | ORAL_TABLET | Freq: Every evening | ORAL | Status: DC | PRN

## 2020-10-07 MED ORDER — LISINOPRIL 10 MG PO TABS: 10.0000 mg | ORAL_TABLET | Freq: Every day | ORAL | Status: DC

## 2020-10-07 MED ORDER — POTASSIUM CHLORIDE 20 MEQ/15ML (10%) PO SOLN: 40.0000 meq | Freq: Three times a day (TID) | ORAL | Status: DC

## 2020-10-07 MED ORDER — TAMSULOSIN HCL 0.4 MG PO CAPS
0.4000 mg | ORAL_CAPSULE | Freq: Every day | ORAL | Status: DC
  Administered 2020-10-08 – 2020-10-11 (×4): 0.4 mg via ORAL
  Filled 2020-10-07 (×4): qty 1

## 2020-10-07 NOTE — Progress Notes (Addendum)
Entered room for 1000 rounds and found patient with labored breathing. Alerted Dr. Merrily Pew who ordered 40mg  Lasix IV and bi pap. Contacted respiratory who came to place bi pap.  Scanned bladder due to patient complaint of abdominal discomfort, scan revealed . As staff was preparing to I&O, patient urinated.   While removing arterial line, assessed patient's left elbow to be swollen and stiff. Dr. ordered x-rays.  Will continue to monitor.

## 2020-10-07 NOTE — Progress Notes (Signed)
NAME:  Mark May, MRN:  [id number], DOB:  [birth year], LOS: 2 ADMISSION DATE:  11-03-20, CONSULTATION DATE: 2020/11/03 REFERRING MD: Dr. Curtis Sites, CHIEF COMPLAINT: Left-sided weakness  Brief History   64 year old male with hypertension and hyperlipidemia who presented with left-sided weakness, noted to have right MCA occlusion status post thrombectomy and stent placement.  Postprocedure CT showed contrast extravasation.  Past Medical History  Hypertension Peptic ulcer disease Gout  Significant Hospital Events   Mechanical thrombectomy of right MCA and stent 11/5  Consults:  IR Neurology PCCM  Procedures:  11/6 right femoral sheath was removed  Significant Diagnostic Tests:  11/5 CT head: No acute abnormality. Minimal diffuse cerebral and cerebellar atrophy. Mild chronic small vessel white matter ischemic changes in both cerebral hemispheres  11/5 CTA head and neck: Severe distal right M1 stenosis or subocclusive thrombus, Right MCA infarct with extensive penumbra, 4 mm right supraclinoid ICA aneurysm. Widely patent cervical carotid and vertebral arteries.  11/5 postprocedure CT head: Hypodensity in the right lateral temporal lobe now shows mild hemorrhage or contrast enhancement. This is most consistent with acute infarct. There has been interval stenting of the right middle cerebral artery.   Micro Data:  11/5 MRSA PCR negative  Antimicrobials:    Interim history/subjective:  Patient was placed on spontaneous breathing trial this morning, tolerated well.  He was extubated but after half an hour he became short of breath, requiring BiPAP.  Objective   Blood pressure (!) 151/75, pulse (!) 123, temperature 99.5 F (37.5 C), temperature source Oral, resp. rate (!) 24, SpO2 94 %.    Vent Mode: BIPAP;PCV FiO2 (%):  [30 %-40 %] 40 % Set Rate:  [15 bmp-18 bmp] 15 bmp Vt Set:  [520 mL] 520 mL PEEP:  [5 cmH20-6 cmH20] 6 cmH20 Pressure Support:  [8 cmH20-12 cmH20] 12  cmH20 Plateau Pressure:  [13 cmH20-15 cmH20] 14 cmH20   Intake/Output Summary (Last 24 hours) at 10/07/2020 1049 Last data filed at 10/07/2020 1000 Gross per 24 hour  Intake 3120.69 ml  Output 1950 ml  Net 1170.69 ml   There were no vitals filed for this visit.  Examination:   Physical exam: General: Middle-aged male, lying in the bed, currently on BiPAP HEAENT: [place]/AT, eyes anicteric.  Neuro: Opens eyes with vocal stimuli, following simple commands.  Pupils 3 mm bilateral reactive to light.  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 and right MCA stent placement by IR with TICI 3 Hypertension Hyperlipidemia Acute hypoxic respiratory failure, due to pulmonary edema, requiring BiPAP Acute diastolic heart failure with pulmonary edema Hypokalemia  Continue neuro check every hour Continue aspirin and Brilinta Continue statin Continue secondary stroke prophylaxis Echocardiogram revealed ejection fraction 70 to 75%, grade 2 diastolic dysfunction 1 dose of Lasix IV today Continue BiPAP SBP goal less than 160 Aggressively supplement electrolytes Monitor intake and output  Best practice:  Diet: Speech and swallow evaluation Pain/Anxiety/Delirium protocol (if indicated): N/A VAP protocol (if indicated): N/A DVT prophylaxis: SCD GI prophylaxis:  Pepcid Glucose control: SSI Mobility: Bedrest Code Status: Full code Family  Communication: Per primary team Disposition: ICU  Labs   CBC: Recent Labs  Lab 10/15/2020 1246 10/07/2020 2024 10/06/20 0522 10/07/20 0527  WBC 7.1  --  10.7* 11.0*  NEUTROABS 5.2  --  8.2*  --   HGB 16.5 14.6 13.5 13.9  HCT 49.1 43.0 40.4 40.4  MCV 89.4  --  89.2 89.0  PLT 291  --  232 238    Basic Metabolic Panel: Recent Labs  Lab 10/16/2020 1246 10/17/2020 2024 10/06/20 0522 10/07/20 0527  NA 142 139 139 139  K 4.3 3.5 3.6 3.0*  CL 102  --  106 109  CO2 29  --  22 21*  GLUCOSE 110*  --  112* 141*  BUN 12  --  10 7*  CREATININE 0.89  --  1.02 0.81  CALCIUM 8.6*  --  8.2* 7.7*   GFR: Estimated Creatinine Clearance: 72.9 mL/min (by C-G formula based on SCr of 0.81 mg/dL). Recent Labs  Lab 10/20/2020 1246 10/06/20 0522 10/07/20 0527  WBC 7.1 10.7* 11.0*    Liver Function Tests: Recent Labs  Lab 10/29/2020 1246  AST 24  ALT 32  ALKPHOS 113  BILITOT 0.9  PROT 7.3  ALBUMIN 4.3   No results for input(s): LIPASE, AMYLASE in the last 168 hours. No results for input(s): AMMONIA in the last 168 hours.  ABG    Component Value Date/Time   PHART 7.415 10/08/2020 2024   PCO2ART 38.0 10/20/2020 2024   PO2ART 205 (H) 10/17/2020 2024   HCO3 24.4 10/29/2020 2024   TCO2 26 10/01/2020 2024   O2SAT 100.0 10/04/2020 2024     Coagulation Profile: Recent Labs  Lab 10/19/2020 1246  INR 0.9    Cardiac Enzymes: No results for input(s): CKTOTAL, CKMB, CKMBINDEX, TROPONINI in the last 168 hours.  HbA1C: Hgb A1c MFr Bld  Date/Time Value Ref Range Status  10/06/2020 05:22 AM 5.8 (H) 4.8 - 5.6 % Final    Comment:    (NOTE) Pre diabetes:          5.7%-6.4%  Diabetes:              >6.4%  Glycemic control for   <7.0% adults with diabetes     CBG: No results for input(s): GLUCAP in the last 168 hours.  Review of Systems:   Unable to perform review of system as patient is deeply sedated at this time  Past Medical History  He,  has a past medical history of Gout and  Hypertension.   Surgical History    Past Surgical History:  Procedure Laterality Date  . PARTIAL GASTRECTOMY  1990   done in [place] for PUD  . PROSTATE BIOPSY  ~ 2005   negative for cancer (done in [place])  . RADIOLOGY WITH ANESTHESIA N/A 10/17/2020   Procedure: IR WITH ANESTHESIA;  Surgeon: Radiologist, Medication, MD;  Location: [facility] OR;  Service: Radiology;  Laterality: N/A;     Social History   reports that he has never smoked. He has never used smokeless tobacco. He reports that he does not drink alcohol and does not use drugs.   Family History   His family history includes Diabetes in his father and mother.   Allergies No Known Allergies   Home Medications  Prior to Admission medications   Medication Sig Start Date End Date Taking? Authorizing Provider  allopurinol (ZYLOPRIM) 100 MG tablet Take 3 tablets (300 mg total)  by mouth daily. 02/17/18   Reubin Milan, MD  amLODipine (NORVASC) 10 MG tablet Take 1 tablet (10 mg total) by mouth daily. 02/17/18   Reubin Milan, MD  meclizine (ANTIVERT) 25 MG tablet Take 1 tablet (25 mg total) by mouth 3 (three) times daily as needed for dizziness. 03/11/20   Lutricia Feil, PA-C  meloxicam (MOBIC) 15 MG tablet Take 1 tablet (15 mg total) by mouth daily. 03/09/18   Reubin Milan, MD  tamsulosin (FLOMAX) 0.4 MG CAPS capsule Take 1 capsule (0.4 mg total) by mouth daily after supper. 03/09/18   Reubin Milan, MD  tizanidine (ZANAFLEX) 2 MG capsule Take 2 mg by mouth 3 (three) times daily.    [provider]     Total critical care time: 39 minutes  Performed by: Cheri Fowler   Critical care time was exclusive of separately billable procedures and treating other patients.   Critical care was necessary to treat or prevent imminent or life-threatening deterioration.   Critical care was time spent personally by me on the following activities: development of treatment plan with patient and/or surrogate as well as nursing,  discussions with consultants, evaluation of patient's response to treatment, examination of patient, obtaining history from patient or surrogate, ordering and performing treatments and interventions, ordering and review of laboratory studies, ordering and review of radiographic studies, pulse oximetry and re-evaluation of patient's condition.   Cheri Fowler MD Critical care physician [facility] [place] [facility]  Pager: [phone] Mobile: [phone]

## 2020-10-07 NOTE — Progress Notes (Signed)
PT Cancellation Note  Patient Details Name: Mark May MRN: [id number] DOB: 12-31-1955   Cancelled Treatment:    Reason Eval/Treat Not Completed: Medical issues which prohibited therapy. Pt requires placement on BiPAP after extubation this morning. PT will hold until respiratory status is more stable.   Arlyss Gandy 10/07/2020, 1:25 PM

## 2020-10-07 NOTE — Progress Notes (Signed)
SLP Cancellation Note  Patient Details Name: Mark May MRN: [id number] DOB: November 13, 1956   Cancelled treatment:       Reason Eval/Treat Not Completed: Patient's level of consciousness;Patient not medically ready. Not alert enough, on BIPAP after some respiratory difficulties following extubation today. Will attempt next date.   Angela Nevin, MA, CCC-SLP Speech Therapy

## 2020-10-07 NOTE — Progress Notes (Signed)
Referring Physician(s): Dr Roda Shutters  Supervising Physician: Julieanne Cotton  Patient Status:  [facility] - In-pt  Chief Complaint:  CVA R MCA occlusion  Subjective:  11/5 NIR procedure:  complete revascularization of near complete occlusion of RT MCA M 1 seg with 1 pass with tiger 21 retriever and aspiration achieving a TICI 3 revascularization .placement of rescue stent m4 mm x 24 mm Atlas for reocclusion with restoration of TICI 3  Pt was extubated this am Moving Rt to command; none on left Alert  MD in room examining pt now  Allergies: Patient has no known allergies.  Medications: Prior to Admission medications   Medication Sig Start Date End Date Taking? Authorizing Provider  allopurinol (ZYLOPRIM) 100 MG tablet Take 3 tablets (300 mg total) by mouth daily. 02/17/18   Reubin Milan, MD  amLODipine (NORVASC) 10 MG tablet Take 1 tablet (10 mg total) by mouth daily. 02/17/18   Reubin Milan, MD  meclizine (ANTIVERT) 25 MG tablet Take 1 tablet (25 mg total) by mouth 3 (three) times daily as needed for dizziness. 03/11/20   Lutricia Feil, PA-C  meloxicam (MOBIC) 15 MG tablet Take 1 tablet (15 mg total) by mouth daily. 03/09/18   Reubin Milan, MD  tamsulosin (FLOMAX) 0.4 MG CAPS capsule Take 1 capsule (0.4 mg total) by mouth daily after supper. 03/09/18   Reubin Milan, MD  tizanidine (ZANAFLEX) 2 MG capsule Take 2 mg by mouth 3 (three) times daily.     [provider]     Vital Signs: BP (!) 151/75   Pulse (!) 123   Temp 99.5 F (37.5 C) (Oral)   Resp (!) 24   SpO2 94% Comment: Simultaneous filing. User may not have seen previous data.  Physical Exam Vitals reviewed.  Constitutional:      Comments: Awakens to voice Can answer yes/no Opens eyes to command Cannot protrude tongue  Musculoskeletal:     Comments: Moves Rt to command None on left Rt groin NT no bleeding Palpable hardened small area deep to groin site No hematoma Rt foot 2+ pulses   Skin:    General: Skin is warm.     Imaging: CT Angio Head W or Wo Contrast  Result Date: 10/04/2020 CLINICAL DATA:  Left-sided numbness and weakness and right gaze preference. EXAM: CT ANGIOGRAPHY HEAD AND NECK CT PERFUSION BRAIN TECHNIQUE: Multidetector CT imaging of the head and neck was performed using the standard protocol during bolus administration of intravenous contrast. Multiplanar CT image reconstructions and MIPs were obtained to evaluate the vascular anatomy. Carotid stenosis measurements (when applicable) are obtained utilizing NASCET criteria, using the distal internal carotid diameter as the denominator. Multiphase CT imaging of the brain was performed following IV bolus contrast injection. Subsequent parametric perfusion maps were calculated using RAPID software. CONTRAST:  OMNIPAQUE IOHEXOL 350 MG/ML SOLN COMPARISON:  None. FINDINGS: CTA NECK FINDINGS Aortic arch: Normal variant aortic arch branching pattern with common origin of the brachiocephalic and left common carotid arteries. Mild arch atherosclerosis without arch vessel origin stenosis. Right carotid system: Patent without evidence of stenosis, dissection, or significant atherosclerosis. Left carotid system: Patent without evidence of stenosis, dissection, or significant atherosclerosis. Vertebral arteries: Patent without evidence of stenosis, dissection, or significant atherosclerosis. Strongly dominant left vertebral artery. Skeleton: Old medial left orbital fracture. 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 Chronic appearing left mastoid air cell opacification. Mild disc degeneration at C4-5. Other neck: No  evidence of cervical lymphadenopathy or mass. Upper chest: Clear lung apices. Review of the MIP images confirms the above findings CTA HEAD FINDINGS Anterior circulation: The internal carotid arteries are patent from skull base to carotid termini with atherosclerotic  plaque resulting in mild left cavernous segment stenosis. A posteriorly projecting aneurysm arising from the distal right supraclinoid ICA measures 4 mm. ACAs and MCAs are patent without evidence of a significant A1 or left M1 stenosis. There is severe irregular narrowing of the distal right M1 segment over a length of 7 mm. No M2 occlusion is identified. Posterior circulation: The intracranial vertebral arteries are patent to the basilar. Patent AICA and SCA origins are seen bilaterally. The basilar artery is widely patent. There are small right and diminutive or absent left posterior communicating arteries. Both PCAs are patent without evidence of a significant proximal stenosis on the right. There is a moderate mid left P2 stenosis. No aneurysm is identified. Venous sinuses: As permitted by contrast timing, patent. Anatomic variants: None. Review of the MIP images confirms the above findings CT Brain Perfusion Findings: ASPECTS: 8 CBF (<30%) Volume: 20 mL Perfusion (Tmax>6.0s) volume: 151 mL Mismatch Volume: 131 mL Infarction Location: Core infarct in the right parietal and temporal lobes (with underlying chronic encephalomalacia in the anterior and lateral right temporal lobe as well) with penumbra throughout the majority of the right MCA territory. IMPRESSION: 1. 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 2. Right MCA infarct with extensive penumbra. 3. 4 mm right supraclinoid ICA aneurysm. 4. Widely patent cervical carotid and vertebral arteries. 5. Aortic Atherosclerosis (ICD10-I70.0). These results were called by telephone at the time of interpretation on 10/30/2020 at 2:45 p.m. to Dr. Wilford Corner, who verbally acknowledged these results. Electronically Signed   By: Sebastian Ache M.D.   On: 10/27/2020 15:09   DG Abd 1 View  Result Date: 10/17/2020 CLINICAL DATA:  Check gastric catheter placement EXAM: ABDOMEN - 1 VIEW COMPARISON:  None. FINDINGS: Gastric catheter is noted extending into the  stomach. It appears coiled upon itself within the distal stomach with kinking at the proximal side port. This could be withdrawn slightly as clinically indicated. Contrast from recent arteriography is noted. IMPRESSION: Gastric catheter within the stomach kinked at the proximal side port. This could be withdrawn slightly S necessary. Electronically Signed   By: Alcide Clever M.D.   On: 10/23/2020 20:21   CT HEAD WO CONTRAST  Result Date: 10/06/2020 CLINICAL DATA:  Stroke follow-up.  Right MCA recanalization today. EXAM: CT HEAD WITHOUT CONTRAST TECHNIQUE: Contiguous axial images were obtained from the base of the skull through the vertex without intravenous contrast. COMPARISON:  CT head approximately 4 hours prior to the current study. FINDINGS: Brain: Right lateral temporal lobe hypodensity unchanged in size. Decrease hyperdensity within this likely due to resolving contrast staining rather than hemorrhage. Small area of hypodensity in the right parietal cortex has developed since the prior study compatible with acute infarct. Ventricle size normal. No midline shift. Mild chronic microvascular ischemic change in the white matter. Vascular: Right MCA stent.  Negative for hyperdense vessel Skull: Negative Sinuses/Orbits: Mucosal edema in the paranasal sinuses. The patient is intubated with an NG tube in place. Other: None IMPRESSION: Right temporal lobe hypodensity compatible with acute infarct. Decreased hyperdensity within this stroke compared with 4 hours previously compatible with contrast staining rather than hemorrhage. Small area of acute infarct in the right parietal lobe now evident. Right MCA stent. Electronically Signed   By: Marlan Palau  M.D.   On: 10/20/2020 22:16   CT HEAD WO CONTRAST  Result Date: 10/06/2020 CLINICAL DATA:  Stroke.  Post intervention. EXAM: CT HEAD WITHOUT CONTRAST TECHNIQUE: Contiguous axial images were obtained from the base of the skull through the vertex without  intravenous contrast. COMPARISON:  CT angio head and neck 10/08/2020 FINDINGS: Brain: Hypodensity in the right lateral temporal lobe now shows mild hyperdensity which may be hemorrhage or contrast enhancement. If this is blood, this is a mild amount of blood. This therefore likely was an area of acute infarct. There has been interval stenting of the right middle cerebral artery. No other areas of acute hemorrhage or infarct. Patchy white matter hypodensity bilaterally compatible chronic microvascular ischemia. Ventricle size normal. Vascular: Normal vascular enhancement following angiography. Skull: Negative Sinuses/Orbits: Mucosal edema paranasal sinuses with near complete opacification right maxillary sinus. Negative orbit Other: None IMPRESSION: Hypodensity in the right lateral temporal lobe now shows mild hemorrhage or contrast enhancement. This is most consistent with acute infarct. There has been interval stenting of the right middle cerebral artery. These results were called by telephone at the time of interpretation on 10/04/2020 at 6:28 pm to provider Bhagat, who verbally acknowledged these results. T Electronically Signed   By: Marlan Palau M.D.   On: 10/08/2020 18:26   CT HEAD WO CONTRAST  Result Date: 10/17/2020 CLINICAL DATA:  Dizziness. Left upper extremity weakness. Headache. EXAM: CT HEAD WITHOUT CONTRAST TECHNIQUE: Contiguous axial images were obtained from the base of the skull through the vertex without intravenous contrast. COMPARISON:  None. FINDINGS: Brain: Minimally enlarged ventricles and subarachnoid spaces. Mild patchy white matter low density in both cerebral hemispheres. No intracranial hemorrhage, mass lesion or CT evidence of acute infarction. Vascular: No hyperdense vessel or unexpected calcification. Skull: Normal. Negative for fracture or focal lesion. Sinuses/Orbits: Unremarkable. Other: None. IMPRESSION: 1. No acute abnormality. 2. Minimal diffuse cerebral and cerebellar  atrophy. 3. Mild chronic small vessel white matter ischemic changes in both cerebral hemispheres. Electronically Signed   By: Beckie Salts M.D.   On: 10/09/2020 13:01   CT Angio Neck W and/or Wo Contrast  Result Date: 10/10/2020 CLINICAL DATA:  Left-sided numbness and weakness and right gaze preference. EXAM: CT ANGIOGRAPHY HEAD AND NECK CT PERFUSION BRAIN TECHNIQUE: Multidetector CT imaging of the head and neck was performed using the standard protocol during bolus administration of intravenous contrast. Multiplanar CT image reconstructions and MIPs were obtained to evaluate the vascular anatomy. Carotid stenosis measurements (when applicable) are obtained utilizing NASCET criteria, using the distal internal carotid diameter as the denominator. Multiphase CT imaging of the brain was performed following IV bolus contrast injection. Subsequent parametric perfusion maps were calculated using RAPID software. CONTRAST:  OMNIPAQUE IOHEXOL 350 MG/ML SOLN COMPARISON:  None. FINDINGS: CTA NECK FINDINGS Aortic arch: Normal variant aortic arch branching pattern with common origin of the brachiocephalic and left common carotid arteries. Mild arch atherosclerosis without arch vessel origin stenosis. Right carotid system: Patent without evidence of stenosis, dissection, or significant atherosclerosis. Left carotid system: Patent without evidence of stenosis, dissection, or significant atherosclerosis. Vertebral arteries: Patent without evidence of stenosis, dissection, or significant atherosclerosis. Strongly dominant left vertebral artery. Skeleton: Old medial left orbital fracture. 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 Chronic appearing left mastoid air cell opacification. Mild disc degeneration at C4-5. Other neck: No evidence of cervical lymphadenopathy or mass. Upper chest: Clear lung apices. Review of the MIP images confirms the above findings CTA  HEAD  FINDINGS Anterior circulation: The internal carotid arteries are patent from skull base to carotid termini with atherosclerotic plaque resulting in mild left cavernous segment stenosis. A posteriorly projecting aneurysm arising from the distal right supraclinoid ICA measures 4 mm. ACAs and MCAs are patent without evidence of a significant A1 or left M1 stenosis. There is severe irregular narrowing of the distal right M1 segment over a length of 7 mm. No M2 occlusion is identified. Posterior circulation: The intracranial vertebral arteries are patent to the basilar. Patent AICA and SCA origins are seen bilaterally. The basilar artery is widely patent. There are small right and diminutive or absent left posterior communicating arteries. Both PCAs are patent without evidence of a significant proximal stenosis on the right. There is a moderate mid left P2 stenosis. No aneurysm is identified. Venous sinuses: As permitted by contrast timing, patent. Anatomic variants: None. Review of the MIP images confirms the above findings CT Brain Perfusion Findings: ASPECTS: 8 CBF (<30%) Volume: 20 mL Perfusion (Tmax>6.0s) volume: 151 mL Mismatch Volume: 131 mL Infarction Location: Core infarct in the right parietal and temporal lobes (with underlying chronic encephalomalacia in the anterior and lateral right temporal lobe as well) with penumbra throughout the majority of the right MCA territory. IMPRESSION: 1. 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 2. Right MCA infarct with extensive penumbra. 3. 4 mm right supraclinoid ICA aneurysm. 4. Widely patent cervical carotid and vertebral arteries. 5. Aortic Atherosclerosis (ICD10-I70.0). These results were called by telephone at the time of interpretation on 10/21/2020 at 2:45 p.m. to Dr. Wilford Corner, who verbally acknowledged these results. Electronically Signed   By: Sebastian Ache M.D.   On: 10/09/2020 15:09   MR ANGIO HEAD WO CONTRAST  Result Date: 10/06/2020 CLINICAL  DATA:  Stroke, follow-up. EXAM: MRI HEAD WITHOUT CONTRAST MRA HEAD WITHOUT CONTRAST TECHNIQUE: Multiplanar, multiecho pulse sequences of the brain and surrounding structures were obtained without intravenous contrast. Angiographic images of the head were obtained using MRA technique without contrast. COMPARISON:  Noncontrast head CT 10/21/2020. CT head 10/19/2020 performed at 6:03 p.m. CT angiogram head/neck performed 10/15/2020. FINDINGS: MRI HEAD FINDINGS Brain: Cerebral volume is normal for age. There is fairly extensive patchy multifocal cortical and subcortical restricted diffusion within the right MCA and watershed territories consistent with acute/early subacute infarction. Acute infarcts are present within the right frontal, parietal, occipital and temporal lobes. Patchy small acute/early subacute infarcts are also present within the right basal ganglia. A subtle acute/early subacute infarct is also present within the posteromedial right thalamus. Superimposed subacute/chronic infarction within the right temporal lobe with cortical laminar necrosis at this site. Additional small acute/early subacute infarcts within the left basal ganglia. Corresponding T2/FLAIR hyperintensity at these sites. No significant mass effect. No midline shift Background moderate cerebral white matter chronic small vessel ischemic disease. Chronic right basal ganglia lacunar infarct. 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 No evidence of intracranial mass. No extra-axial fluid collection. No midline shift. Vascular: Reported below. Skull and upper cervical spine: No focal marrow lesion. Sinuses/Orbits: Visualized orbits show no acute finding. Complete T2 hyperintense opacification of the right maxillary sinus. Moderate ethmoid sinus mucosal thickening. Other: Left mastoid effusion. MRA HEAD FINDINGS The intracranial  internal carotid arteries are patent. Interval stenting of the M1/M2 right middle cerebral artery with associated stent artifact. There is flow related signal proximal and distal to the stent suggestive of stent patency. Flow related signal within M2 and more  distal right MCA branch vessels. The M1 left MCA is patent without stenosis. No left M2 proximal branch occlusion or high-grade proximal stenosis. 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 The intracranial vertebral arteries are patent. The basilar artery is patent. The posterior cerebral arteries are patent. Mild atherosclerotic narrowing of the proximal P2 left PCA (series 1060, image 19). IMPRESSION: MRI brain: 1. Fairly extensive patchy acute/early subacute infarcts within the right MCA/watershed territory. 2. Additional small acute/early subacute infarcts within the dorsal right thalamus and left basal ganglia. 3. Background moderate chronic small vessel ischemic disease. MRA head: 1. Interval stenting of the M1/M2 right middle cerebral artery. Flow related signal is present proximal and distal to the stent suggestive of stent patency. 2. 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 Electronically Signed   By: Jackey LogeKyle  Golden DO   On: 10/06/2020 13:43   MR BRAIN WO CONTRAST  Result Date: 10/06/2020 CLINICAL DATA:  Stroke, follow-up. EXAM: MRI HEAD WITHOUT CONTRAST MRA HEAD WITHOUT CONTRAST TECHNIQUE: Multiplanar, multiecho pulse sequences of the brain and surrounding structures were obtained without intravenous contrast. Angiographic images of the head were obtained using MRA technique without contrast. COMPARISON:  Noncontrast head CT 10/08/2020. CT head 10/20/2020 performed at 6:03 p.m. CT angiogram head/neck performed 10/12/2020. FINDINGS: MRI HEAD FINDINGS Brain: Cerebral volume is normal for age. There is fairly extensive patchy multifocal cortical and subcortical restricted  diffusion within the right MCA and watershed territories consistent with acute/early subacute infarction. Acute infarcts are present within the right frontal, parietal, occipital and temporal lobes. Patchy small acute/early subacute infarcts are also present within the right basal ganglia. A subtle acute/early subacute infarct is also present within the posteromedial right thalamus. Superimposed subacute/chronic infarction within the right temporal lobe with cortical laminar necrosis at this site. Additional small acute/early subacute infarcts within the left basal ganglia. Corresponding T2/FLAIR hyperintensity at these sites. No significant mass effect. No midline shift Background moderate cerebral white matter chronic small vessel ischemic disease. Chronic right basal ganglia lacunar infarct. 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 No evidence of intracranial mass. No extra-axial fluid collection. No midline shift. Vascular: Reported below. Skull and upper cervical spine: No focal marrow lesion. Sinuses/Orbits: Visualized orbits show no acute finding. Complete T2 hyperintense opacification of the right maxillary sinus. Moderate ethmoid sinus mucosal thickening. Other: Left mastoid effusion. MRA HEAD FINDINGS The intracranial internal carotid arteries are patent. Interval stenting of the M1/M2 right middle cerebral artery with associated stent artifact. There is flow related signal proximal and distal to the stent suggestive of stent patency. Flow related signal within M2 and more distal right MCA branch vessels. The M1 left MCA is patent without stenosis. No left M2 proximal branch occlusion or high-grade proximal stenosis. 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 The intracranial vertebral arteries are patent. The basilar artery is patent. The posterior cerebral  arteries are patent. Mild atherosclerotic narrowing of the proximal P2 left PCA (series 1060, image 19). IMPRESSION: MRI brain: 1. Fairly extensive patchy acute/early subacute infarcts within the right MCA/watershed territory. 2. Additional small acute/early subacute infarcts within the dorsal right thalamus and left basal ganglia. 3. Background moderate chronic small vessel ischemic disease. MRA head: 1. Interval stenting of the M1/M2 right middle cerebral artery. Flow related signal is present proximal and distal to the stent suggestive of stent patency. 2. Redemonstrated 4 mm saccular aneurysm arising  from the supraclinoid right ICA. Electronically Signed   By: Jackey Loge DO   On: 10/06/2020 13:43   CT CEREBRAL PERFUSION W CONTRAST  Result Date: 10/07/2020 CLINICAL DATA:  Left-sided numbness and weakness and right gaze preference. EXAM: CT ANGIOGRAPHY HEAD AND NECK CT PERFUSION BRAIN TECHNIQUE: Multidetector CT imaging of the head and neck was performed using the standard protocol during bolus administration of intravenous contrast. Multiplanar CT image reconstructions and MIPs were obtained to evaluate the vascular anatomy. Carotid stenosis measurements (when applicable) are obtained utilizing NASCET criteria, using the distal internal carotid diameter as the denominator. Multiphase CT imaging of the brain was performed following IV bolus contrast injection. Subsequent parametric perfusion maps were calculated using RAPID software. CONTRAST:  OMNIPAQUE IOHEXOL 350 MG/ML SOLN COMPARISON:  None. FINDINGS: CTA NECK FINDINGS Aortic arch: Normal variant aortic arch branching pattern with common origin of the brachiocephalic and left common carotid arteries. Mild arch atherosclerosis without arch vessel origin stenosis. Right carotid system: Patent without evidence of stenosis, dissection, or significant atherosclerosis. Left carotid system: Patent without evidence of stenosis, dissection, or significant  atherosclerosis. Vertebral arteries: Patent without evidence of stenosis, dissection, or significant atherosclerosis. Strongly dominant left vertebral artery. Skeleton: Old medial left orbital fracture. 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 Chronic appearing left mastoid air cell opacification. Mild disc degeneration at C4-5. Other neck: No evidence of cervical lymphadenopathy or mass. Upper chest: Clear lung apices. Review of the MIP images confirms the above findings CTA HEAD FINDINGS Anterior circulation: The internal carotid arteries are patent from skull base to carotid termini with atherosclerotic plaque resulting in mild left cavernous segment stenosis. A posteriorly projecting aneurysm arising from the distal right supraclinoid ICA measures 4 mm. ACAs and MCAs are patent without evidence of a significant A1 or left M1 stenosis. There is severe irregular narrowing of the distal right M1 segment over a length of 7 mm. No M2 occlusion is identified. Posterior circulation: The intracranial vertebral arteries are patent to the basilar. Patent AICA and SCA origins are seen bilaterally. The basilar artery is widely patent. There are small right and diminutive or absent left posterior communicating arteries. Both PCAs are patent without evidence of a significant proximal stenosis on the right. There is a moderate mid left P2 stenosis. No aneurysm is identified. Venous sinuses: As permitted by contrast timing, patent. Anatomic variants: None. Review of the MIP images confirms the above findings CT Brain Perfusion Findings: ASPECTS: 8 CBF (<30%) Volume: 20 mL Perfusion (Tmax>6.0s) volume: 151 mL Mismatch Volume: 131 mL Infarction Location: Core infarct in the right parietal and temporal lobes (with underlying chronic encephalomalacia in the anterior and lateral right temporal lobe as well) with penumbra throughout the majority of the right MCA territory.  IMPRESSION: 1. 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 2. Right MCA infarct with extensive penumbra. 3. 4 mm right supraclinoid ICA aneurysm. 4. Widely patent cervical carotid and vertebral arteries. 5. Aortic Atherosclerosis (ICD10-I70.0). These results were called by telephone at the time of interpretation on 10/16/2020 at 2:45 p.m. to Dr. Wilford Corner, who verbally acknowledged these results. Electronically Signed   By: Sebastian Ache M.D.   On: 10/18/2020 15:09   ECHOCARDIOGRAM COMPLETE  Result Date: 10/06/2020    ECHOCARDIOGRAM REPORT   Patient Name:   Mark May Date of Exam: 10/06/2020 Medical Rec #:  [id number]   Height:       67.0 in Accession #:  [id number]  Weight:       125.0 lb Date of Birth:  01-16-1956    BSA:          1.656 m Patient Age:    64 years    BP:           109/63 mmHg Patient Gender: M           HR:           71 bpm. Exam Location:  Inpatient Procedure: 2D Echo Indications:    stroke 434.91  History:        Patient has no prior history of Echocardiogram examinations.                 Risk Factors:Hypertension.  Sonographer:    Celene Skeen RDCS (AE) Referring Phys: [id number] Marvel Plan  Sonographer Comments: Echo performed with patient supine and on artificial respirator. Image acquisition challenging due to respiratory motion. very restricted mobility. difficulty remaining still at times. IMPRESSIONS  1. Left ventricular ejection fraction, by estimation, is 70 to 75%. The left ventricle has hyperdynamic function. The left ventricle has no regional wall motion abnormalities. Left ventricular diastolic parameters are consistent with Grade II diastolic dysfunction (pseudonormalization). Elevated left atrial pressure.  2. Right ventricular systolic function is normal. The right ventricular size is normal.  3. The mitral valve is normal in structure. Mild mitral valve regurgitation. No evidence of mitral stenosis.  4. The aortic valve is normal in structure. Aortic valve  regurgitation is not visualized. No aortic stenosis is present.  5. The inferior vena cava is dilated in size with <50% respiratory variability, suggesting right atrial pressure of 15 mmHg. Conclusion(s)/Recommendation(s): No intracardiac source of embolism detected on this transthoracic study. A transesophageal echocardiogram is recommended to exclude cardiac source of embolism if clinically indicated. FINDINGS  Left Ventricle: Left ventricular ejection fraction, by estimation, is 70 to 75%. The left ventricle has hyperdynamic function. The left ventricle has no regional wall motion abnormalities. The left ventricular internal cavity size was normal in size. There is no left ventricular hypertrophy. Left ventricular diastolic parameters are consistent with Grade II diastolic dysfunction (pseudonormalization). Elevated left atrial pressure. Right Ventricle: The right ventricular size is normal. No increase in right ventricular wall thickness. Right ventricular systolic function is normal. Left Atrium: Left atrial size was normal in size. Right Atrium: Right atrial size was normal in size. Pericardium: There is no evidence of pericardial effusion. Mitral Valve: The mitral valve is normal in structure. Mild mitral valve regurgitation. No evidence of mitral valve stenosis. Tricuspid Valve: The tricuspid valve is normal in structure. Tricuspid valve regurgitation is trivial. No evidence of tricuspid stenosis. Aortic Valve: The aortic valve is normal in structure. Aortic valve regurgitation is not visualized. No aortic stenosis is present. Pulmonic Valve: The pulmonic valve was normal in structure. Pulmonic valve regurgitation is not visualized. No evidence of pulmonic stenosis. Aorta: The aortic root is normal in size and structure. Venous: The inferior vena cava is dilated in size with less than 50% respiratory variability, suggesting right atrial pressure of 15 mmHg. IAS/Shunts: No atrial level shunt detected by color  flow Doppler.  LEFT VENTRICLE PLAX 2D LVIDd:         3.60 cm  Diastology LVIDs:         1.90 cm  LV e' medial:    7.07 cm/s LV PW:         1.00 cm  LV E/e' medial:  15.8 LV IVS:  0.90 cm  LV e' lateral:   9.46 cm/s LVOT diam:     2.40 cm  LV E/e' lateral: 11.8 LV SV:         155 LV SV Index:   93 LVOT Area:     4.52 cm  LEFT ATRIUM             Index       RIGHT ATRIUM           Index LA diam:        2.90 cm 1.75 cm/m  RA Area:     10.30 cm LA Vol (A2C):   42.3 ml 25.54 ml/m RA Volume:   18.60 ml  11.23 ml/m LA Vol (A4C):   33.2 ml 20.05 ml/m LA Biplane Vol: 39.7 ml 23.97 ml/m  AORTIC VALVE LVOT Vmax:   153.00 cm/s LVOT Vmean:  115.000 cm/s LVOT VTI:    0.342 m  AORTA Ao Root diam: 2.80 cm MITRAL VALVE MV Area (PHT): 2.99 cm     SHUNTS MV Decel Time: 254 msec     Systemic VTI:  0.34 m MV E velocity: 112.00 cm/s  Systemic Diam: 2.40 cm MV A velocity: 94.70 cm/s MV E/A ratio:  1.18 Tobias Alexander MD Electronically signed by Tobias Alexander MD Signature Date/Time: 10/06/2020/3:01:36 PM    Final     Labs:  CBC: Recent Labs    10/21/2020 1246 10/08/2020 2024 10/06/20 0522 10/07/20 0527  WBC 7.1  --  10.7* 11.0*  HGB 16.5 14.6 13.5 13.9  HCT 49.1 43.0 40.4 40.4  PLT 291  --  232 238    COAGS: Recent Labs    10/18/2020 1246  INR 0.9  APTT 25    BMP: Recent Labs    10/25/2020 1246 10/06/2020 2024 10/06/20 0522 10/07/20 0527  NA 142 139 139 139  K 4.3 3.5 3.6 3.0*  CL 102  --  106 109  CO2 29  --  22 21*  GLUCOSE 110*  --  112* 141*  BUN 12  --  10 7*  CALCIUM 8.6*  --  8.2* 7.7*  CREATININE 0.89  --  1.02 0.81  GFRNONAA >60  --  >60 >60    LIVER FUNCTION TESTS: Recent Labs    10/20/2020 1246  BILITOT 0.9  AST 24  ALT 32  ALKPHOS 113  PROT 7.3  ALBUMIN 4.3    Assessment and Plan:  CVA Rt MCA revascularization 11/5 Extubated; following most commands-- moving Rt - none on left Will follow  Electronically Signed: Robet Leu, PA-C 10/07/2020, 10:51 AM   I  spent a total of 15 Minutes at the the patient's bedside AND on the patient's hospital floor or unit, greater than 50% of which was counseling/coordinating care for CVA/ Rt MCA revascularization

## 2020-10-07 NOTE — Progress Notes (Signed)
Post extubation patient was doing well for about 1 hour and then he started with tachypnea and hypoxia to low 90s, on physical exam he was noted to have bilateral crackles on lung exam.  He became tachycardic to 130s. Bedside echocardiogram was done which showed adequate heart function and bilateral B-lines on lung POCUS exam IV Lasix was given. Also he was found uncomfortable, bladder scan was done which showed at least 600 cc of urine in the bladder, Foley catheter was placed  Updated patient's daughter at bedside  Additional critical care time spent 50 minutes

## 2020-10-07 NOTE — Procedures (Signed)
Extubation Procedure Note  Patient Details:   Name: Mark May DOB: 26-May-1956 MRN: [id number]   Airway Documentation:    Vent end date: 10/07/20 Vent end time: 0901   Evaluation  O2 sats: stable throughout Complications: No apparent complications Patient did tolerate procedure well. Bilateral Breath Sounds: Clear, Diminished, Other (Comment) (coarse)   Yes   Prior to extubation, pt did have positive cuff leak. Pt was extubated to [facility] Pt tolerated well with SVS. Pt was able to state his name. RT will continue to monitor pt.  Hart Rochester R 10/07/2020, 9:02 AM

## 2020-10-07 NOTE — Progress Notes (Signed)
STROKE TEAM PROGRESS NOTE   HISTORY OF PRESENT ILLNESS (per record) Mark May is a 64 y.o. Asian male with PMH of hypertension, BPH, sciatica, stomach ulcer status post surgery in the past presented to ER for acute onset left-sided weakness, left facial droop and left hemianopia.  Per patient, he woke up 3 AM to go to bathroom.  Initially, he was ambulating to bathroom without problem, however in the bathroom, he started to have difficulty on the left side, feeding numbness on the left arm and leg, not able to hold paper tissue on the left hand.  He told his wife, they felt that he probably sleep in wrong position.  Did not pay too much attention.  Patient got up around 8 AM, continue have left arm weakness and also had left leg weakness along with numbness.  He did not tell his son until 73 AM.  Son brought him to [facility] for evaluation.  Plan CT showed no acute abnormality, chronic right temporal lobe encephalomalacia.  CT head and neck showed right M1 high-grade stenosis versus near occlusive thrombosis.  CT perfusion showed large penumbra.  Patient transferred to [facility] for thrombectomy.  Per patient and son, he has no history of stroke, but did have intermittent left-sided arm and leg numbness.  They contributed to sciatica.  Patient stated that he has hypertension but did not take any medication.  He had peptic ulcer disease status post surgery in the past, without any problem since.  LSN: 3 AM today tPA Given: No: Outside window   INTERVAL HISTORY His nurse is at bedside. Stable. Plan is for extubation today. Mandarin only speaking. More alert today and following visual cues to life right arm and right leg.    OBJECTIVE Vitals:   10/07/20 0700 10/07/20 0758 10/07/20 0800 10/07/20 0900  BP: 125/69  127/85 (!) 147/72  Pulse: 80 97 94 (!) 112  Resp:  (!) 24    Temp:      TempSrc:      SpO2: 100% 100% 100% 98%    CBC:  Recent Labs  Lab  10/03/2020 1246 10/19/2020 2024 10/06/20 0522 10/07/20 0527  WBC 7.1  --  10.7* 11.0*  NEUTROABS 5.2  --  8.2*  --   HGB 16.5   < > 13.5 13.9  HCT 49.1   < > 40.4 40.4  MCV 89.4  --  89.2 89.0  PLT 291  --  232 238   < > = values in this interval not displayed.    Basic Metabolic Panel:  Recent Labs  Lab 10/06/20 0522 10/07/20 0527  NA 139 139  K 3.6 3.0*  CL 106 109  CO2 22 21*  GLUCOSE 112* 141*  BUN 10 7*  CREATININE 1.02 0.81  CALCIUM 8.2* 7.7*    Lipid Panel:     Component Value Date/Time   CHOL 173 10/06/2020 0522   CHOL 216 (H) 03/09/2018 1620   TRIG 396 (H) 10/07/2020 0527   HDL 33 (L) 10/06/2020 0522   HDL 59 03/09/2018 1620   CHOLHDL 5.2 10/06/2020 0522   VLDL 55 (H) 10/06/2020 0522   LDLCALC 85 10/06/2020 0522   LDLCALC 136 (H) 03/09/2018 1620   HgbA1c:  Lab Results  Component Value Date   HGBA1C 5.8 (H) 10/06/2020   Urine Drug Screen: No results found for: LABOPIA, COCAINSCRNUR, LABBENZ, AMPHETMU, THCU, LABBARB  Alcohol Level No results found for: ETH  IMAGING  CT Angio Head W or  Wo Contrast CT Angio Neck W and/or Wo Contrast CT CEREBRAL PERFUSION W CONTRAST 10/23/2020 IMPRESSION:  1. 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  2. Right MCA infarct with extensive penumbra.  3. 4 mm right supraclinoid ICA aneurysm.  4. Widely patent cervical carotid and vertebral arteries.  5. Aortic Atherosclerosis (ICD10-I70.0).   MRI / MRA  10/06/20 IMPRESSION:  MRI brain: 1. Fairly extensive patchy acute/early subacute infarcts within the right MCA/watershed territory. 2. Additional small acute/early subacute infarcts within the dorsal right thalamus and left basal ganglia. 3. Background moderate chronic small vessel ischemic disease.  MRA head: 1. Interval stenting of the M1/M2 right middle cerebral artery. Flow related signal is present proximal and distal to the stent suggestive of stent patency. 2. Redemonstrated 4 mm saccular  aneurysm arising from the supraclinoid right ICA.  DG Abd 1 View 10/19/2020 IMPRESSION:  Gastric catheter within the stomach kinked at the proximal side port. This could be withdrawn slightly S necessary.   CT HEAD WO CONTRAST 10/13/2020 IMPRESSION:  Right temporal lobe hypodensity compatible with acute infarct. Decreased hyperdensity within this stroke compared with 4 hours previously compatible with contrast staining rather than hemorrhage. Small area of acute infarct in the right parietal lobe now evident. Right MCA stent.   CT HEAD WO CONTRAST 10/21/2020 IMPRESSION:  Hypodensity in the right lateral temporal lobe now shows mild hemorrhage or contrast enhancement. This is most consistent with acute infarct. There has been interval stenting of the right middle cerebral artery.   CT HEAD WO CONTRAST 10/21/2020 IMPRESSION:  1. No acute abnormality.  2. Minimal diffuse cerebral and cerebellar atrophy.  3. Mild chronic small vessel white matter ischemic changes in both cerebral hemispheres.   Transthoracic Echocardiogram  10/06/2020 IMPRESSIONS  1. Left ventricular ejection fraction, by estimation, is 70 to 75%. The  left ventricle has hyperdynamic function. The left ventricle has no  regional wall motion abnormalities. Left ventricular diastolic parameters  are consistent with Grade II diastolic  dysfunction (pseudonormalization). Elevated left atrial pressure.  2. Right ventricular systolic function is normal. The right ventricular  size is normal.  3. The mitral valve is normal in structure. Mild mitral valve  regurgitation. No evidence of mitral stenosis.  4. The aortic valve is normal in structure. Aortic valve regurgitation is  not visualized. No aortic stenosis is present.  5. The inferior vena cava is dilated in size with <50% respiratory  variability, suggesting right atrial pressure of 15 mmHg.  ECG - SR rate 70 BPM. (See cardiology reading for complete  details)  PHYSICAL EXAM Blood pressure (!) 147/72, pulse (!) 112, temperature 99.5 F (37.5 C), temperature source Oral, resp. rate (!) 24, SpO2 98 %.   Intubated,  By report only mandarin speaking. Groin sheath removed yesterday. PERRL, grimaces to stim, opens eyes spontaneously, conjugate midline gaze right preference,blinks on the right not on the left , + cough/gag, Cannot assess facial symmetry or tongue movement due to ETT, Purposeful and spontaneous movements on the right. W/d slightly to pain left lower extremity, no movement left arm.  Gait deferred.     ASSESSMENT/PLAN Mr. Rodrick Payson is a 64 y.o. male with history of hypertension, BPH, sciatica, stomach ulcer status post surgery in the past presented to ER at [facility] for acute onset left-sided weakness, left facial droop and left hemianopia.  CT head and neck showed right M1 high-grade stenosis versus near occlusive thrombosis.  CT perfusion showed large penumbra.  Patient transferred  to [facility] for thrombectomy.  He did not receive IV t-PA due to late presentation (>4.5 hours from time of onset). IR - complete occlusion of RT MCA M 1 seg - thrombectomy ->TICI 3 revascularization with placement of rescue stent m4 mm x 24 mm Atlas for reocclusion with restoration of TICI 3 .  Stroke: Rt lateral temporal infarct - embolic - source unknown.  Code Stroke CT Head - not ordered   CT Head - No acute abnormality. Minimal diffuse cerebral and cerebellar atrophy. Mild chronic small vessel white matter ischemic changes in both cerebral hemispheres.   CT head - Hypodensity in the right lateral temporal lobe now shows mild hemorrhage or contrast enhancement. This is most consistent with acute infarct. There has been interval stenting of the right middle cerebral artery.   CT Head - Right temporal lobe hypodensity compatible with acute infarct. Decreased hyperdensity within this stroke compared with 4 hours previously  compatible with contrast staining rather than hemorrhage. Small area of acute infarct in the right parietal lobe now evident. Right MCA stent.  MRI head - Fairly extensive patchy acute/early subacute infarcts within the right MCA/watershed territory. Additional small acute/early subacute infarcts within the dorsal right thalamus and left basal ganglia.  MRA head -  Interval stenting of the M1/M2 right middle cerebral artery. Flow related signal is present proximal and distal to the stent suggestive of stent patency. 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  CTA H&N - Severe distal right M1 stenosis or subocclusive thrombus. 4 mm right supraclinoid ICA aneurysm. Widely patent cervical carotid and vertebral arteries.   CT Perfusion - Right MCA infarct with extensive penumbra.   Carotid Doppler - CTA neck ordered - carotid dopplers not indicated.  2D Echo - EF 70 - 75%. No cardiac source of emboli identified.   [employer] Virus 2 - negative  LDL - 85  HgbA1c - 5.8  UDS - not ordered  VTE prophylaxis - SCDs Diet  Diet Order            Diet NPO time specified  Diet effective now                 No antithrombotic prior to admission, now on aspirin 81 mg daily and Brilinta (ticagrelor) 90 mg bid bolused with cangrelor with a slow 4 hr infusion.  Patient will be counseled to be compliant with his antithrombotic medications  Ongoing aggressive stroke risk factor management  Therapy recommendations:  pending  Disposition:  Pending  Hypertension  Home BP meds: Amlodipine  Current BP meds: Cleviprex prn - add Lisinopril 10 mg daily (try to get off Cleviprex)  Stable . Initial SBP goal 120 - 140 s/p thrombectomy . Long-term BP goal normotensive  Hyperlipidemia  Home Lipid lowering medication:   LDL 85, goal < 70  Current lipid lowering medication when can swallow: NPO   Continue statin at discharge  Other Stroke Risk  Factors  Advanced age  Other Active Problems  Code status - Full code  Pt speaks Madarin    Aortic Atherosclerosis (ICD10-I70.0).   4 mm right supraclinoid ICA aneurysm.  Hypokalemia - 3.0 - supplement - recheck in AM  TEE and loop tomorrow currently NPO - note sent to cardiology.  Hospital day # 2   Personally examined patient and images, and have participated in and made any corrections needed to history, physical, neuro exam,assessment and plan as stated above.  I have personally obtained the  history, evaluated lab date, reviewed imaging studies and agree with radiology interpretations.    Naomie DeanAntonia Babak Lucus, MD Stroke Neurology  I spent 35 minutes of face-to-face and non-face-to-face time with patient. This included prechart review, lab review, study review, order entry, electronic health record documentation, patient education on the different diagnostic and therapeutic options, counseling and coordination of care, risks and benefits of management, compliance, or risk factor reduction     I spent 30 minutes of neurocritical care time in the care of this patient. This included prechart review, lab review, study review, order entry, electronic health record documentation, patient education on the different diagnostic and therapeutic options, counseling and coordination of care, risks and benefits of management, compliance, or risk factor reduction     To contact Stroke Continuity provider, please refer to [email or web address]. After hours, contact General Neurology

## 2020-10-08 ENCOUNTER — Inpatient Hospital Stay (HOSPITAL_COMMUNITY): Payer: 59

## 2020-10-08 DIAGNOSIS — E876 Hypokalemia: Secondary | ICD-10-CM | POA: Diagnosis not present

## 2020-10-08 DIAGNOSIS — I639 Cerebral infarction, unspecified: Secondary | ICD-10-CM | POA: Diagnosis not present

## 2020-10-08 DIAGNOSIS — M7989 Other specified soft tissue disorders: Secondary | ICD-10-CM | POA: Diagnosis not present

## 2020-10-08 DIAGNOSIS — R1310 Dysphagia, unspecified: Secondary | ICD-10-CM | POA: Diagnosis not present

## 2020-10-08 DIAGNOSIS — I6601 Occlusion and stenosis of right middle cerebral artery: Secondary | ICD-10-CM | POA: Diagnosis not present

## 2020-10-08 LAB — GLUCOSE, CAPILLARY
Glucose-Capillary: 118 mg/dL — ABNORMAL HIGH (ref 70–99)
Glucose-Capillary: 135 mg/dL — ABNORMAL HIGH (ref 70–99)
Glucose-Capillary: 150 mg/dL — ABNORMAL HIGH (ref 70–99)
Glucose-Capillary: 150 mg/dL — ABNORMAL HIGH (ref 70–99)
Glucose-Capillary: 157 mg/dL — ABNORMAL HIGH (ref 70–99)

## 2020-10-08 LAB — COMPREHENSIVE METABOLIC PANEL
ALT: 38 U/L (ref 0–44)
AST: 53 U/L — ABNORMAL HIGH (ref 15–41)
Albumin: 2.6 g/dL — ABNORMAL LOW (ref 3.5–5.0)
Alkaline Phosphatase: 121 U/L (ref 38–126)
Anion gap: 12 (ref 5–15)
BUN: 18 mg/dL (ref 8–23)
CO2: 26 mmol/L (ref 22–32)
Calcium: 7.8 mg/dL — ABNORMAL LOW (ref 8.9–10.3)
Chloride: 101 mmol/L (ref 98–111)
Creatinine, Ser: 1.39 mg/dL — ABNORMAL HIGH (ref 0.61–1.24)
GFR, Estimated: 56 mL/min — ABNORMAL LOW (ref >60)
Glucose, Bld: 130 mg/dL — ABNORMAL HIGH (ref 70–99)
Potassium: 3.6 mmol/L (ref 3.5–5.1)
Sodium: 139 mmol/L (ref 135–145)
Total Bilirubin: 2.4 mg/dL — ABNORMAL HIGH (ref 0.3–1.2)
Total Protein: 5.9 g/dL — ABNORMAL LOW (ref 6.5–8.1)

## 2020-10-08 LAB — CBC
HCT: 43.7 % (ref 39.0–52.0)
Hemoglobin: 14.6 g/dL (ref 13.0–17.0)
MCH: 29.7 pg (ref 26.0–34.0)
MCHC: 33.4 g/dL (ref 30.0–36.0)
MCV: 88.8 fL (ref 80.0–100.0)
Platelets: 212 10*3/uL (ref 150–400)
RBC: 4.92 MIL/uL (ref 4.22–5.81)
RDW: 13.4 % (ref 11.5–15.5)
WBC: 8.4 10*3/uL (ref 4.0–10.5)
nRBC: 0 % (ref 0.0–0.2)

## 2020-10-08 LAB — PLATELET INHIBITION P2Y12: Platelet Function  P2Y12: 43 [PRU] — ABNORMAL LOW (ref 182–335)

## 2020-10-08 LAB — PHOSPHORUS: Phosphorus: 4.9 mg/dL — ABNORMAL HIGH (ref 2.5–4.6)

## 2020-10-08 LAB — MAGNESIUM: Magnesium: 1.8 mg/dL (ref 1.7–2.4)

## 2020-10-08 MED ORDER — ATORVASTATIN CALCIUM 10 MG PO TABS
20.0000 mg | ORAL_TABLET | Freq: Every day | ORAL | Status: DC
  Administered 2020-10-08 – 2020-10-11 (×4): 20 mg via ORAL
  Filled 2020-10-08 (×4): qty 2

## 2020-10-08 MED ORDER — LISINOPRIL 10 MG PO TABS
10.0000 mg | ORAL_TABLET | Freq: Every day | ORAL | Status: DC
  Administered 2020-10-09 – 2020-10-11 (×3): 10 mg via ORAL
  Filled 2020-10-08 (×3): qty 1

## 2020-10-08 MED ORDER — VITAL HIGH PROTEIN PO LIQD: 1000.0000 mL | ORAL | Status: DC

## 2020-10-08 MED ORDER — POTASSIUM CHLORIDE 20 MEQ/15ML (10%) PO SOLN: 40.0000 meq | Freq: Once | ORAL | Status: DC

## 2020-10-08 MED ORDER — POTASSIUM CHLORIDE CRYS ER 20 MEQ PO TBCR
40.0000 meq | EXTENDED_RELEASE_TABLET | Freq: Once | ORAL | Status: AC
  Administered 2020-10-08: 40 meq via ORAL
  Filled 2020-10-08: qty 2

## 2020-10-08 MED ORDER — OSMOLITE 1.2 CAL PO LIQD: 1000.0000 mL | ORAL | Status: DC

## 2020-10-08 MED ORDER — ENOXAPARIN SODIUM 40 MG/0.4ML ~~LOC~~ SOLN
40.0000 mg | SUBCUTANEOUS | Status: DC
  Administered 2020-10-08 – 2020-10-11 (×4): 40 mg via SUBCUTANEOUS
  Filled 2020-10-08 (×4): qty 0.4

## 2020-10-08 MED ORDER — LISINOPRIL 10 MG PO TABS: 10.0000 mg | ORAL_TABLET | Freq: Every day | ORAL | Status: DC

## 2020-10-08 MED ORDER — PROSOURCE TF PO LIQD: 45.0000 mL | Freq: Two times a day (BID) | ORAL | Status: DC

## 2020-10-08 MED ORDER — PANTOPRAZOLE SODIUM 40 MG PO PACK: 40.0000 mg | PACK | Freq: Every day | ORAL | Status: DC

## 2020-10-08 MED ORDER — PANTOPRAZOLE SODIUM 40 MG PO TBEC: 40.0000 mg | DELAYED_RELEASE_TABLET | Freq: Every day | ORAL | Status: DC

## 2020-10-08 MED ORDER — RESOURCE THICKENUP CLEAR PO POWD
ORAL | Status: DC | PRN
  Filled 2020-10-08: qty 125

## 2020-10-08 NOTE — Evaluation (Cosign Needed)
Clinical/Bedside Swallow Evaluation Patient Details  Name: Mark May MRN: [id number] Date of Birth: Oct 10, 1956  Today's Date: 10/08/2020 Time: SLP Start Time (ACUTE ONLY): 0900 SLP Stop Time (ACUTE ONLY): 0921 SLP Time Calculation (min) (ACUTE ONLY): 21 min  Past Medical History:  Past Medical History:  Diagnosis Date  . Gout   . Hypertension    Past Surgical History:  Past Surgical History:  Procedure Laterality Date  . PARTIAL GASTRECTOMY  1990   done in [place] for PUD  . PROSTATE BIOPSY  ~ 2005   negative for cancer (done in [place])  . RADIOLOGY WITH ANESTHESIA N/A 10/04/2020   Procedure: IR WITH ANESTHESIA;  Surgeon: Radiologist, Medication, MD;  Location: [facility] OR;  Service: Radiology;  Laterality: N/A;   HPI:  Mr. Mark May is a 64 y.o. male with history of hypertension, BPH, sciatica, stomach ulcer status post surgery in the past presented to ER at [facility] for acute onset left-sided weakness, left facial droop and left hemianopia. CT head and neck showed right M1 high-grade stenosis versus near occlusive thrombosis. CT perfusion showed large penumbra. Patient transferred to [facility] for thrombectomy. Pt intubated 11/5-11/7 with post-extubation respiratory distress.   Assessment / Plan / Recommendation Clinical Impression  Pt was seen for BSE with interpretation service. He demonstrated low vocal intensity and his volitional cough was observed to be weak. Oral motor assessment revealed reduced L lingual strength and ROM. L anterior spillage of ice chips was observed. Pt demonstrated immediate coughing with thin liquid and intermitten throat clearing throughout the remainder of the session. He reports pain in his R side during coughing. Given s/sx of aspiration, recommend instrumental testing to further assess swallow function. MBS scheduled for later this afternoon.  SLP Visit Diagnosis: Dysphagia, unspecified (R13.10)    Aspiration Risk  Moderate aspiration risk     Diet Recommendation NPO   Medication Administration: Via alternative means    Other  Recommendations Oral Care Recommendations: Oral care QID   Follow up Recommendations        Frequency and Duration min 2x/week  2 weeks       Prognosis Prognosis for Safe Diet Advancement: Good      Swallow Study   General HPI: Mr. Mark May is a 64 y.o. male with history of hypertension, BPH, sciatica, stomach ulcer status post surgery in the past presented to ER at [facility] for acute onset left-sided weakness, left facial droop and left hemianopia. CT head and neck showed right M1 high-grade stenosis versus near occlusive thrombosis. CT perfusion showed large penumbra. Patient transferred to [facility] for thrombectomy. Pt intubated 11/5-11/7 with post-extubation respiratory distress. Type of Study: Bedside Swallow Evaluation Diet Prior to this Study: NPO Temperature Spikes Noted: No Respiratory Status: Room air History of Recent Intubation: Yes Length of Intubations (days): 2 days Date extubated: 10/07/20 Behavior/Cognition: Alert;Cooperative Oral Cavity Assessment: Within Functional Limits Oral Care Completed by SLP: No Oral Cavity - Dentition: Adequate natural dentition Vision: Functional for self-feeding Self-Feeding Abilities: Needs assist Patient Positioning: Upright in bed Baseline Vocal Quality: Low vocal intensity Volitional Cough: Weak    Oral/Motor/Sensory Function Overall Oral Motor/Sensory Function: Within functional limits Facial ROM: Reduced left Facial Symmetry: Within Functional Limits Facial Strength: Within Functional Limits Lingual ROM: Reduced left Lingual Symmetry: Within Functional Limits Lingual Strength: Reduced   Ice Chips Ice chips: Impaired Presentation: Spoon Oral Phase Functional Implications: Left anterior spillage   Thin Liquid Thin Liquid: Impaired Presentation:  Straw;Cup Pharyngeal  Phase Impairments: Cough -  Immediate;Throat Clearing - Immediate    Nectar Thick Nectar Thick Liquid: Within functional limits   Honey Thick     Puree Puree: Within functional limits   Solid     Solid: Not tested      Royetta Crochet 10/08/2020,10:08 AM

## 2020-10-08 NOTE — Progress Notes (Signed)
Modified Barium Swallow Progress Note  Patient Details  Name: Demetries Coia MRN: [id number] Date of Birth: [birth year]  Today's Date: 10/08/2020  Modified Barium Swallow completed.  Full report located under Chart Review in the Imaging Section.  Brief recommendations include the following:  Clinical Impression  Pt was seen for MBS with son present for translating. MBS revealed a pharyngeal dysphagia characterized by silent aspiration, pharyngeal residue, and multiple swallows. Given pt's presentation during MBS, suspect he has pharyngeal weakness that is contributing to his dysphagia. Silent aspiration of thin liquid was observed before the pt had achieved full laryngeal closure. Pt's volitional cough was observed to be weak and unsuccessful. No other instances of aspiration were observed. Moderate amounts of pharyngeal residue were observed in the valleulae and pyriforms across POs. SLP educated pt in use of an effortful swallow, which was successful in reducing the amount of pharyngeal residue. Pt was also observed to utilize multiple swallows across POs. Recommend dys 3 diet with nectar thick liquid and use of effortful swallow. SLP will continue to f/u for diet toleration.    Swallow Evaluation Recommendations       SLP Diet Recommendations: Dysphagia 3 (Mech soft) solids;Nectar thick liquid   Liquid Administration via: Cup;Straw   Medication Administration: Crushed with puree   Supervision: Staff to assist with self feeding;Full supervision/cueing for compensatory strategies   Compensations: Effortful swallow   Postural Changes: Seated upright at 90 degrees   Oral Care Recommendations: Oral care BID   Other Recommendations: Order thickener from pharmacy    Royetta Crochet 10/08/2020,1:38 PM

## 2020-10-08 NOTE — Progress Notes (Signed)
Transferred to 3 [facility] room 7

## 2020-10-08 NOTE — Progress Notes (Addendum)
Referring Physician(s): Code stroke- Marvel Plan (neurology)  Supervising Physician: Julieanne Cotton  Patient Status:  [facility] - In-pt  Chief Complaint: None  Subjective:  History of acute CVA s/p cerebral arteriogram with emergent mechanical thrombectomy of right MCA M1 segment occlusion, along with rescue stenting of right MCA M1 reocclusion using an Atlas stent placement achieving a TICI 3 revascularization via right femoral approach 10/26/2020 by Dr. Corliss Skains. Patient laying in bed resting comfortably. He responds to voice and follows simple commands. No complaints. Can spontaneously move right side and LLE, no spontaneous movements of LUE. Right femoral puncture site c/d/i.   Allergies: Patient has no known allergies.  Medications: Prior to Admission medications   Medication Sig Start Date End Date Taking? Authorizing Provider  allopurinol (ZYLOPRIM) 100 MG tablet Take 3 tablets (300 mg total) by mouth daily. 02/17/18   Reubin Milan, MD  amLODipine (NORVASC) 10 MG tablet Take 1 tablet (10 mg total) by mouth daily. 02/17/18   Reubin Milan, MD  meclizine (ANTIVERT) 25 MG tablet Take 1 tablet (25 mg total) by mouth 3 (three) times daily as needed for dizziness. 03/11/20   Lutricia Feil, PA-C  meloxicam (MOBIC) 15 MG tablet Take 1 tablet (15 mg total) by mouth daily. 03/09/18   Reubin Milan, MD  tamsulosin (FLOMAX) 0.4 MG CAPS capsule Take 1 capsule (0.4 mg total) by mouth daily after supper. 03/09/18   Reubin Milan, MD  tizanidine (ZANAFLEX) 2 MG capsule Take 2 mg by mouth 3 (three) times daily.     [provider]     Vital Signs: BP 120/85   Pulse 89   Temp 98.9 F (37.2 C) (Oral)   Resp (!) 28   SpO2 98%   Physical Exam Vitals and nursing note reviewed.  Constitutional:      General: He is not in acute distress. Pulmonary:     Effort: Pulmonary effort is normal. No respiratory distress.  Skin:    General: Skin is warm and dry.      Comments: Right femoral puncture site soft without active bleeding or hematoma.  Neurological:     Mental Status: He is alert.     Comments: Alert and awake, oriented to person and place (aware in a hospital in [place], [place]). He follows simple commands. Speech intact. PERRL bilaterally. Tongue midline. Can spontaneously move right side and LLE, no spontaneous movements of LUE. Distal pulses (DPs) 2+ bilaterally.     Imaging: CT Angio Head W or Wo Contrast  Result Date: 10/14/2020 CLINICAL DATA:  Left-sided numbness and weakness and right gaze preference. EXAM: CT ANGIOGRAPHY HEAD AND NECK CT PERFUSION BRAIN TECHNIQUE: Multidetector CT imaging of the head and neck was performed using the standard protocol during bolus administration of intravenous contrast. Multiplanar CT image reconstructions and MIPs were obtained to evaluate the vascular anatomy. Carotid stenosis measurements (when applicable) are obtained utilizing NASCET criteria, using the distal internal carotid diameter as the denominator. Multiphase CT imaging of the brain was performed following IV bolus contrast injection. Subsequent parametric perfusion maps were calculated using RAPID software. CONTRAST:  OMNIPAQUE IOHEXOL 350 MG/ML SOLN COMPARISON:  None. FINDINGS: CTA NECK FINDINGS Aortic arch: Normal variant aortic arch branching pattern with common origin of the brachiocephalic and left common carotid arteries. Mild arch atherosclerosis without arch vessel origin stenosis. Right carotid system: Patent without evidence of stenosis, dissection, or significant atherosclerosis. Left carotid system: Patent without evidence of stenosis, dissection, or significant atherosclerosis. Vertebral  arteries: Patent without evidence of stenosis, dissection, or significant atherosclerosis. Strongly dominant left vertebral artery. Skeleton: Old medial left orbital fracture. 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 Chronic appearing left mastoid air cell opacification. Mild disc degeneration at C4-5. Other neck: No evidence of cervical lymphadenopathy or mass. Upper chest: Clear lung apices. Review of the MIP images confirms the above findings CTA HEAD FINDINGS Anterior circulation: The internal carotid arteries are patent from skull base to carotid termini with atherosclerotic plaque resulting in mild left cavernous segment stenosis. A posteriorly projecting aneurysm arising from the distal right supraclinoid ICA measures 4 mm. ACAs and MCAs are patent without evidence of a significant A1 or left M1 stenosis. There is severe irregular narrowing of the distal right M1 segment over a length of 7 mm. No M2 occlusion is identified. Posterior circulation: The intracranial vertebral arteries are patent to the basilar. Patent AICA and SCA origins are seen bilaterally. The basilar artery is widely patent. There are small right and diminutive or absent left posterior communicating arteries. Both PCAs are patent without evidence of a significant proximal stenosis on the right. There is a moderate mid left P2 stenosis. No aneurysm is identified. Venous sinuses: As permitted by contrast timing, patent. Anatomic variants: None. Review of the MIP images confirms the above findings CT Brain Perfusion Findings: ASPECTS: 8 CBF (<30%) Volume: 20 mL Perfusion (Tmax>6.0s) volume: 151 mL Mismatch Volume: 131 mL Infarction Location: Core infarct in the right parietal and temporal lobes (with underlying chronic encephalomalacia in the anterior and lateral right temporal lobe as well) with penumbra throughout the majority of the right MCA territory. IMPRESSION: 1. 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 2. Right MCA infarct with extensive penumbra. 3. 4 mm right supraclinoid ICA aneurysm. 4. Widely patent cervical carotid and vertebral arteries. 5. Aortic Atherosclerosis (ICD10-I70.0). These results  were called by telephone at the time of interpretation on 2020-11-01 at 2:45 p.m. to Dr. Wilford Corner, who verbally acknowledged these results. Electronically Signed   By: Sebastian Ache M.D.   On: 11-01-2020 15:09   DG Elbow 2 Views Left  Result Date: 10/07/2020 CLINICAL DATA:  Posterior left elbow swelling and decreased range of motion. Post stroke. No known injury. EXAM: LEFT ELBOW - 2 VIEW COMPARISON:  None. FINDINGS: Extended positioning on the lateral view precludes accurate assessment for joint effusion. No appreciable joint effusion. No fracture or dislocation. No focal osseous lesions. Small enthesophyte at the posterior olecranon. Soft tissue swelling overlying the olecranon. No significant arthropathy. No osseous erosions or periosteal reaction. IMPRESSION: Soft tissue swelling overlying the olecranon, suggesting olecranon bursitis. Small enthesophyte at the posterior olecranon. No acute osseous abnormality. Electronically Signed   By: Delbert Phenix M.D.   On: 10/07/2020 16:09   DG Abd 1 View  Result Date: 2020/11/01 CLINICAL DATA:  Check gastric catheter placement EXAM: ABDOMEN - 1 VIEW COMPARISON:  None. FINDINGS: Gastric catheter is noted extending into the stomach. It appears coiled upon itself within the distal stomach with kinking at the proximal side port. This could be withdrawn slightly as clinically indicated. Contrast from recent arteriography is noted. IMPRESSION: Gastric catheter within the stomach kinked at the proximal side port. This could be withdrawn slightly S necessary. Electronically Signed   By: Alcide Clever M.D.   On: November 01, 2020 20:21   CT HEAD WO CONTRAST  Result Date: 2020/11/01 CLINICAL DATA:  Stroke follow-up.  Right MCA recanalization today. EXAM: CT HEAD WITHOUT CONTRAST  TECHNIQUE: Contiguous axial images were obtained from the base of the skull through the vertex without intravenous contrast. COMPARISON:  CT head approximately 4 hours prior to the current study. FINDINGS:  Brain: Right lateral temporal lobe hypodensity unchanged in size. Decrease hyperdensity within this likely due to resolving contrast staining rather than hemorrhage. Small area of hypodensity in the right parietal cortex has developed since the prior study compatible with acute infarct. Ventricle size normal. No midline shift. Mild chronic microvascular ischemic change in the white matter. Vascular: Right MCA stent.  Negative for hyperdense vessel Skull: Negative Sinuses/Orbits: Mucosal edema in the paranasal sinuses. The patient is intubated with an NG tube in place. Other: None IMPRESSION: Right temporal lobe hypodensity compatible with acute infarct. Decreased hyperdensity within this stroke compared with 4 hours previously compatible with contrast staining rather than hemorrhage. Small area of acute infarct in the right parietal lobe now evident. Right MCA stent. Electronically Signed   By: Marlan Palau M.D.   On: 10/24/2020 22:16   CT HEAD WO CONTRAST  Result Date: 10/15/2020 CLINICAL DATA:  Stroke.  Post intervention. EXAM: CT HEAD WITHOUT CONTRAST TECHNIQUE: Contiguous axial images were obtained from the base of the skull through the vertex without intravenous contrast. COMPARISON:  CT angio head and neck 10/19/2020 FINDINGS: Brain: Hypodensity in the right lateral temporal lobe now shows mild hyperdensity which may be hemorrhage or contrast enhancement. If this is blood, this is a mild amount of blood. This therefore likely was an area of acute infarct. There has been interval stenting of the right middle cerebral artery. No other areas of acute hemorrhage or infarct. Patchy white matter hypodensity bilaterally compatible chronic microvascular ischemia. Ventricle size normal. Vascular: Normal vascular enhancement following angiography. Skull: Negative Sinuses/Orbits: Mucosal edema paranasal sinuses with near complete opacification right maxillary sinus. Negative orbit Other: None IMPRESSION:  Hypodensity in the right lateral temporal lobe now shows mild hemorrhage or contrast enhancement. This is most consistent with acute infarct. There has been interval stenting of the right middle cerebral artery. These results were called by telephone at the time of interpretation on 10/13/2020 at 6:28 pm to provider Bhagat, who verbally acknowledged these results. T Electronically Signed   By: Marlan Palau M.D.   On: 10/24/2020 18:26   CT HEAD WO CONTRAST  Result Date: 10/25/2020 CLINICAL DATA:  Dizziness. Left upper extremity weakness. Headache. EXAM: CT HEAD WITHOUT CONTRAST TECHNIQUE: Contiguous axial images were obtained from the base of the skull through the vertex without intravenous contrast. COMPARISON:  None. FINDINGS: Brain: Minimally enlarged ventricles and subarachnoid spaces. Mild patchy white matter low density in both cerebral hemispheres. No intracranial hemorrhage, mass lesion or CT evidence of acute infarction. Vascular: No hyperdense vessel or unexpected calcification. Skull: Normal. Negative for fracture or focal lesion. Sinuses/Orbits: Unremarkable. Other: None. IMPRESSION: 1. No acute abnormality. 2. Minimal diffuse cerebral and cerebellar atrophy. 3. Mild chronic small vessel white matter ischemic changes in both cerebral hemispheres. Electronically Signed   By: Beckie Salts M.D.   On: 10/11/2020 13:01   CT Angio Neck W and/or Wo Contrast  Result Date: 10/15/2020 CLINICAL DATA:  Left-sided numbness and weakness and right gaze preference. EXAM: CT ANGIOGRAPHY HEAD AND NECK CT PERFUSION BRAIN TECHNIQUE: Multidetector CT imaging of the head and neck was performed using the standard protocol during bolus administration of intravenous contrast. Multiplanar CT image reconstructions and MIPs were obtained to evaluate the vascular anatomy. Carotid stenosis measurements (when applicable) are obtained utilizing NASCET criteria, using the  distal internal carotid diameter as the denominator.  Multiphase CT imaging of the brain was performed following IV bolus contrast injection. Subsequent parametric perfusion maps were calculated using RAPID software. CONTRAST:  OMNIPAQUE IOHEXOL 350 MG/ML SOLN COMPARISON:  None. FINDINGS: CTA NECK FINDINGS Aortic arch: Normal variant aortic arch branching pattern with common origin of the brachiocephalic and left common carotid arteries. Mild arch atherosclerosis without arch vessel origin stenosis. Right carotid system: Patent without evidence of stenosis, dissection, or significant atherosclerosis. Left carotid system: Patent without evidence of stenosis, dissection, or significant atherosclerosis. Vertebral arteries: Patent without evidence of stenosis, dissection, or significant atherosclerosis. Strongly dominant left vertebral artery. Skeleton: Old medial left orbital fracture. 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 Chronic appearing left mastoid air cell opacification. Mild disc degeneration at C4-5. Other neck: No evidence of cervical lymphadenopathy or mass. Upper chest: Clear lung apices. Review of the MIP images confirms the above findings CTA HEAD FINDINGS Anterior circulation: The internal carotid arteries are patent from skull base to carotid termini with atherosclerotic plaque resulting in mild left cavernous segment stenosis. A posteriorly projecting aneurysm arising from the distal right supraclinoid ICA measures 4 mm. ACAs and MCAs are patent without evidence of a significant A1 or left M1 stenosis. There is severe irregular narrowing of the distal right M1 segment over a length of 7 mm. No M2 occlusion is identified. Posterior circulation: The intracranial vertebral arteries are patent to the basilar. Patent AICA and SCA origins are seen bilaterally. The basilar artery is widely patent. There are small right and diminutive or absent left posterior communicating arteries. Both PCAs are patent  without evidence of a significant proximal stenosis on the right. There is a moderate mid left P2 stenosis. No aneurysm is identified. Venous sinuses: As permitted by contrast timing, patent. Anatomic variants: None. Review of the MIP images confirms the above findings CT Brain Perfusion Findings: ASPECTS: 8 CBF (<30%) Volume: 20 mL Perfusion (Tmax>6.0s) volume: 151 mL Mismatch Volume: 131 mL Infarction Location: Core infarct in the right parietal and temporal lobes (with underlying chronic encephalomalacia in the anterior and lateral right temporal lobe as well) with penumbra throughout the majority of the right MCA territory. IMPRESSION: 1. 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 2. Right MCA infarct with extensive penumbra. 3. 4 mm right supraclinoid ICA aneurysm. 4. Widely patent cervical carotid and vertebral arteries. 5. Aortic Atherosclerosis (ICD10-I70.0). These results were called by telephone at the time of interpretation on 10/24/2020 at 2:45 p.m. to Dr. Wilford Corner, who verbally acknowledged these results. Electronically Signed   By: Sebastian Ache M.D.   On: 10/25/2020 15:09   MR ANGIO HEAD WO CONTRAST  Result Date: 10/06/2020 CLINICAL DATA:  Stroke, follow-up. EXAM: MRI HEAD WITHOUT CONTRAST MRA HEAD WITHOUT CONTRAST TECHNIQUE: Multiplanar, multiecho pulse sequences of the brain and surrounding structures were obtained without intravenous contrast. Angiographic images of the head were obtained using MRA technique without contrast. COMPARISON:  Noncontrast head CT 10/17/2020. CT head 10/10/2020 performed at 6:03 p.m. CT angiogram head/neck performed 10/14/2020. FINDINGS: MRI HEAD FINDINGS Brain: Cerebral volume is normal for age. There is fairly extensive patchy multifocal cortical and subcortical restricted diffusion within the right MCA and watershed territories consistent with acute/early subacute infarction. Acute infarcts are present within the right frontal, parietal, occipital and  temporal lobes. Patchy small acute/early subacute infarcts are also present within the right basal ganglia. A subtle acute/early subacute infarct is also present within the posteromedial  right thalamus. Superimposed subacute/chronic infarction within the right temporal lobe with cortical laminar necrosis at this site. Additional small acute/early subacute infarcts within the left basal ganglia. Corresponding T2/FLAIR hyperintensity at these sites. No significant mass effect. No midline shift Background moderate cerebral white matter chronic small vessel ischemic disease. Chronic right basal ganglia lacunar infarct. 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 No evidence of intracranial mass. No extra-axial fluid collection. No midline shift. Vascular: Reported below. Skull and upper cervical spine: No focal marrow lesion. Sinuses/Orbits: Visualized orbits show no acute finding. Complete T2 hyperintense opacification of the right maxillary sinus. Moderate ethmoid sinus mucosal thickening. Other: Left mastoid effusion. MRA HEAD FINDINGS The intracranial internal carotid arteries are patent. Interval stenting of the M1/M2 right middle cerebral artery with associated stent artifact. There is flow related signal proximal and distal to the stent suggestive of stent patency. Flow related signal within M2 and more distal right MCA branch vessels. The M1 left MCA is patent without stenosis. No left M2 proximal branch occlusion or high-grade proximal stenosis. 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 The intracranial vertebral arteries are patent. The basilar artery is patent. The posterior cerebral arteries are patent. Mild atherosclerotic narrowing of the proximal P2 left PCA (series 1060, image 19). IMPRESSION: MRI brain: 1. Fairly extensive patchy acute/early subacute infarcts  within the right MCA/watershed territory. 2. Additional small acute/early subacute infarcts within the dorsal right thalamus and left basal ganglia. 3. Background moderate chronic small vessel ischemic disease. MRA head: 1. Interval stenting of the M1/M2 right middle cerebral artery. Flow related signal is present proximal and distal to the stent suggestive of stent patency. 2. 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 Electronically Signed   By: Jackey Loge DO   On: 10/06/2020 13:43   MR BRAIN WO CONTRAST  Result Date: 10/06/2020 CLINICAL DATA:  Stroke, follow-up. EXAM: MRI HEAD WITHOUT CONTRAST MRA HEAD WITHOUT CONTRAST TECHNIQUE: Multiplanar, multiecho pulse sequences of the brain and surrounding structures were obtained without intravenous contrast. Angiographic images of the head were obtained using MRA technique without contrast. COMPARISON:  Noncontrast head CT 10/19/2020. CT head 10/18/2020 performed at 6:03 p.m. CT angiogram head/neck performed 10/11/2020. FINDINGS: MRI HEAD FINDINGS Brain: Cerebral volume is normal for age. There is fairly extensive patchy multifocal cortical and subcortical restricted diffusion within the right MCA and watershed territories consistent with acute/early subacute infarction. Acute infarcts are present within the right frontal, parietal, occipital and temporal lobes. Patchy small acute/early subacute infarcts are also present within the right basal ganglia. A subtle acute/early subacute infarct is also present within the posteromedial right thalamus. Superimposed subacute/chronic infarction within the right temporal lobe with cortical laminar necrosis at this site. Additional small acute/early subacute infarcts within the left basal ganglia. Corresponding T2/FLAIR hyperintensity at these sites. No significant mass effect. No midline shift Background moderate cerebral white matter chronic small vessel ischemic disease. Chronic right  basal ganglia lacunar infarct. 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 No evidence of intracranial mass. No extra-axial fluid collection. No midline shift. Vascular: Reported below. Skull and upper cervical spine: No focal marrow lesion. Sinuses/Orbits: Visualized orbits show no acute finding. Complete T2 hyperintense opacification of the right maxillary sinus. Moderate ethmoid sinus mucosal thickening. Other: Left mastoid effusion. MRA HEAD FINDINGS The intracranial internal carotid arteries are patent. Interval stenting of the M1/M2  right middle cerebral artery with associated stent artifact. There is flow related signal proximal and distal to the stent suggestive of stent patency. Flow related signal within M2 and more distal right MCA branch vessels. The M1 left MCA is patent without stenosis. No left M2 proximal branch occlusion or high-grade proximal stenosis. 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 The intracranial vertebral arteries are patent. The basilar artery is patent. The posterior cerebral arteries are patent. Mild atherosclerotic narrowing of the proximal P2 left PCA (series 1060, image 19). IMPRESSION: MRI brain: 1. Fairly extensive patchy acute/early subacute infarcts within the right MCA/watershed territory. 2. Additional small acute/early subacute infarcts within the dorsal right thalamus and left basal ganglia. 3. Background moderate chronic small vessel ischemic disease. MRA head: 1. Interval stenting of the M1/M2 right middle cerebral artery. Flow related signal is present proximal and distal to the stent suggestive of stent patency. 2. 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 Electronically Signed   By: Jackey Loge DO   On: 10/06/2020 13:43   CT CEREBRAL PERFUSION W CONTRAST  Result Date:  10/26/2020 CLINICAL DATA:  Left-sided numbness and weakness and right gaze preference. EXAM: CT ANGIOGRAPHY HEAD AND NECK CT PERFUSION BRAIN TECHNIQUE: Multidetector CT imaging of the head and neck was performed using the standard protocol during bolus administration of intravenous contrast. Multiplanar CT image reconstructions and MIPs were obtained to evaluate the vascular anatomy. Carotid stenosis measurements (when applicable) are obtained utilizing NASCET criteria, using the distal internal carotid diameter as the denominator. Multiphase CT imaging of the brain was performed following IV bolus contrast injection. Subsequent parametric perfusion maps were calculated using RAPID software. CONTRAST:  OMNIPAQUE IOHEXOL 350 MG/ML SOLN COMPARISON:  None. FINDINGS: CTA NECK FINDINGS Aortic arch: Normal variant aortic arch branching pattern with common origin of the brachiocephalic and left common carotid arteries. Mild arch atherosclerosis without arch vessel origin stenosis. Right carotid system: Patent without evidence of stenosis, dissection, or significant atherosclerosis. Left carotid system: Patent without evidence of stenosis, dissection, or significant atherosclerosis. Vertebral arteries: Patent without evidence of stenosis, dissection, or significant atherosclerosis. Strongly dominant left vertebral artery. Skeleton: Old medial left orbital fracture. 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 Chronic appearing left mastoid air cell opacification. Mild disc degeneration at C4-5. Other neck: No evidence of cervical lymphadenopathy or mass. Upper chest: Clear lung apices. Review of the MIP images confirms the above findings CTA HEAD FINDINGS Anterior circulation: The internal carotid arteries are patent from skull base to carotid termini with atherosclerotic plaque resulting in mild left cavernous segment stenosis. A posteriorly projecting aneurysm arising  from the distal right supraclinoid ICA measures 4 mm. ACAs and MCAs are patent without evidence of a significant A1 or left M1 stenosis. There is severe irregular narrowing of the distal right M1 segment over a length of 7 mm. No M2 occlusion is identified. Posterior circulation: The intracranial vertebral arteries are patent to the basilar. Patent AICA and SCA origins are seen bilaterally. The basilar artery is widely patent. There are small right and diminutive or absent left posterior communicating arteries. Both PCAs are patent without evidence of a significant proximal stenosis on the right. There is a moderate mid left P2 stenosis. No aneurysm is identified. Venous sinuses: As permitted by contrast timing, patent. Anatomic variants: None. Review of the MIP images confirms the above findings CT Brain Perfusion Findings: ASPECTS: 8 CBF (<30%) Volume: 20  mL Perfusion (Tmax>6.0s) volume: 151 mL Mismatch Volume: 131 mL Infarction Location: Core infarct in the right parietal and temporal lobes (with underlying chronic encephalomalacia in the anterior and lateral right temporal lobe as well) with penumbra throughout the majority of the right MCA territory. IMPRESSION: 1. 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 2. Right MCA infarct with extensive penumbra. 3. 4 mm right supraclinoid ICA aneurysm. 4. Widely patent cervical carotid and vertebral arteries. 5. Aortic Atherosclerosis (ICD10-I70.0). These results were called by telephone at the time of interpretation on 10/15/2020 at 2:45 p.m. to Dr. Wilford CornerArora, who verbally acknowledged these results. Electronically Signed   By: Sebastian AcheAllen  Grady M.D.   On: 10/12/2020 15:09   DG Abd Portable 1V  Result Date: 10/07/2020 CLINICAL DATA:  NG tube placement EXAM: PORTABLE ABDOMEN - 1 VIEW COMPARISON:  October 05, 2020 FINDINGS: The enteric tube projects over the gastric body. The tube is likely kinked at the side hole. The bowel gas pattern is nonobstructive. There  is probable bilateral nephrolithiasis. IMPRESSION: Enteric tube projects over the gastric body. The tube is likely kinked at the side hole. Probable bilateral nephrolithiasis. Electronically Signed   By: Katherine Mantlehristopher  Green M.D.   On: 10/07/2020 22:50   ECHOCARDIOGRAM COMPLETE  Result Date: 10/06/2020    ECHOCARDIOGRAM REPORT   Patient Name:   Mark CastillaLI FEI May Date of Exam: 10/06/2020 Medical Rec #:  [id number]   Height:       67.0 in Accession #:    [id number]  Weight:       125.0 lb Date of Birth:  01/02/1956    BSA:          1.656 m Patient Age:    64 years    BP:           109/63 mmHg Patient Gender: M           HR:           71 bpm. Exam Location:  Inpatient Procedure: 2D Echo Indications:    stroke 434.91  History:        Patient has no prior history of Echocardiogram examinations.                 Risk Factors:Hypertension.  Sonographer:    Celene SkeenVijay Shankar RDCS (AE) Referring Phys: [id number] Marvel PlanJINDONG XU  Sonographer Comments: Echo performed with patient supine and on artificial respirator. Image acquisition challenging due to respiratory motion. very restricted mobility. difficulty remaining still at times. IMPRESSIONS  1. Left ventricular ejection fraction, by estimation, is 70 to 75%. The left ventricle has hyperdynamic function. The left ventricle has no regional wall motion abnormalities. Left ventricular diastolic parameters are consistent with Grade II diastolic dysfunction (pseudonormalization). Elevated left atrial pressure.  2. Right ventricular systolic function is normal. The right ventricular size is normal.  3. The mitral valve is normal in structure. Mild mitral valve regurgitation. No evidence of mitral stenosis.  4. The aortic valve is normal in structure. Aortic valve regurgitation is not visualized. No aortic stenosis is present.  5. The inferior vena cava is dilated in size with <50% respiratory variability, suggesting right atrial pressure of 15 mmHg. Conclusion(s)/Recommendation(s): No intracardiac  source of embolism detected on this transthoracic study. A transesophageal echocardiogram is recommended to exclude cardiac source of embolism if clinically indicated. FINDINGS  Left Ventricle: Left ventricular ejection fraction, by estimation, is 70 to 75%. The left ventricle has hyperdynamic function. The left ventricle has no regional wall motion abnormalities. The left  ventricular internal cavity size was normal in size. There is no left ventricular hypertrophy. Left ventricular diastolic parameters are consistent with Grade II diastolic dysfunction (pseudonormalization). Elevated left atrial pressure. Right Ventricle: The right ventricular size is normal. No increase in right ventricular wall thickness. Right ventricular systolic function is normal. Left Atrium: Left atrial size was normal in size. Right Atrium: Right atrial size was normal in size. Pericardium: There is no evidence of pericardial effusion. Mitral Valve: The mitral valve is normal in structure. Mild mitral valve regurgitation. No evidence of mitral valve stenosis. Tricuspid Valve: The tricuspid valve is normal in structure. Tricuspid valve regurgitation is trivial. No evidence of tricuspid stenosis. Aortic Valve: The aortic valve is normal in structure. Aortic valve regurgitation is not visualized. No aortic stenosis is present. Pulmonic Valve: The pulmonic valve was normal in structure. Pulmonic valve regurgitation is not visualized. No evidence of pulmonic stenosis. Aorta: The aortic root is normal in size and structure. Venous: The inferior vena cava is dilated in size with less than 50% respiratory variability, suggesting right atrial pressure of 15 mmHg. IAS/Shunts: No atrial level shunt detected by color flow Doppler.  LEFT VENTRICLE PLAX 2D LVIDd:         3.60 cm  Diastology LVIDs:         1.90 cm  LV e' medial:    7.07 cm/s LV PW:         1.00 cm  LV E/e' medial:  15.8 LV IVS:        0.90 cm  LV e' lateral:   9.46 cm/s LVOT diam:      2.40 cm  LV E/e' lateral: 11.8 LV SV:         155 LV SV Index:   93 LVOT Area:     4.52 cm  LEFT ATRIUM             Index       RIGHT ATRIUM           Index LA diam:        2.90 cm 1.75 cm/m  RA Area:     10.30 cm LA Vol (A2C):   42.3 ml 25.54 ml/m RA Volume:   18.60 ml  11.23 ml/m LA Vol (A4C):   33.2 ml 20.05 ml/m LA Biplane Vol: 39.7 ml 23.97 ml/m  AORTIC VALVE LVOT Vmax:   153.00 cm/s LVOT Vmean:  115.000 cm/s LVOT VTI:    0.342 m  AORTA Ao Root diam: 2.80 cm MITRAL VALVE MV Area (PHT): 2.99 cm     SHUNTS MV Decel Time: 254 msec     Systemic VTI:  0.34 m MV E velocity: 112.00 cm/s  Systemic Diam: 2.40 cm MV A velocity: 94.70 cm/s MV E/A ratio:  1.18 Tobias Alexander MD Electronically signed by Tobias Alexander MD Signature Date/Time: 10/06/2020/3:01:36 PM    Final     Labs:  CBC: Recent Labs    10/13/2020 1246 10/04/2020 1246 10/15/2020 2024 10/06/20 0522 10/07/20 0527 10/08/20 0727  WBC 7.1  --   --  10.7* 11.0* 8.4  HGB 16.5   < > 14.6 13.5 13.9 14.6  HCT 49.1   < > 43.0 40.4 40.4 43.7  PLT 291  --   --  232 238 212   < > = values in this interval not displayed.    COAGS: Recent Labs    10/07/2020 1246  INR 0.9  APTT 25    BMP: Recent Labs  2020/10/28 1246 28-Oct-2020 1246 October 28, 2020 2024 10/06/20 0522 10/07/20 0527 10/08/20 0727  NA 142   < > 139 139 139 139  K 4.3   < > 3.5 3.6 3.0* 3.6  CL 102  --   --  106 109 101  CO2 29  --   --  22 21* 26  GLUCOSE 110*  --   --  112* 141* 130*  BUN 12  --   --  10 7* 18  CALCIUM 8.6*  --   --  8.2* 7.7* 7.8*  CREATININE 0.89  --   --  1.02 0.81 1.39*  GFRNONAA >60  --   --  >60 >60 56*   < > = values in this interval not displayed.    LIVER FUNCTION TESTS: Recent Labs    Oct 28, 2020 1246 10/08/20 0727  BILITOT 0.9 2.4*  AST 24 53*  ALT 32 38  ALKPHOS 113 121  PROT 7.3 5.9*  ALBUMIN 4.3 2.6*    Assessment and Plan:  History of acute CVA s/p cerebral arteriogram with emergent mechanical thrombectomy of right MCA M1  segment occlusion, along with rescue stenting of right MCA M1 reocclusion using an Atlas stent placement achieving a TICI 3 revascularization via right femoral approach 28-Oct-2020 by Dr. Corliss Skains. Patient's condition stable- awake and alert, follows simple commands, can spontaneously move right side and LLE, no spontaneous movements of LUE. Right femoral puncture site stable, distal pulses (DPs) 2+ bilaterally. Continue taking Brilinta 90 mg twice daily and Aspirin 81 mg once daily. Will obtain P2Y12 today. Plan to follow-up with Dr. Corliss Skains in clinic 6 weeks after discharge (NIR schedulers to call patient to set up this appointment). Further plans per neurology- appreciate and agree with management. NIR to follow.  Tele-interpretor [phone] was used throughout today's interaction.   ADDENDUM: P2Y12 43 PRU this AM- acceptable per Dr. Corliss Skains- continue taking Brilinta 90 mg twice daily and Aspirin 81 mg once daily. Please call NIR with questions/concerns.   Electronically Signed: Elwin Mocha, PA-C 10/08/2020, 9:40 AM   I spent a total of 25 Minutes at the the patient's bedside AND on the patient's hospital floor or unit, greater than 50% of which was counseling/coordinating care for CVA s/p revascularization.

## 2020-10-08 NOTE — Progress Notes (Signed)
Left upper extremity venous duplex has been completed. Preliminary results can be found in CV Proc through chart review.  Results were given to the patient's nurse, Consuella Lose.  10/08/20 1:52 PM Olen Cordial RVT

## 2020-10-08 NOTE — Evaluation (Signed)
Physical Therapy Evaluation Patient Details Name: Mark May MRN: [id number] DOB: 05-28-1956 Today's Date: 10/08/2020   History of Present Illness  The pt is a 64 y.o. male with PMH of hypertension, BPH, sciatica, stomach ulcer status post surgery in the past presented to ER for acute onset left-sided weakness, left facial droop and left hemianopia. CT head and neck showed right M1 high-grade stenosis versus near occlusive thrombosis.  CT perfusion showed large penumbra. Pt underwent R MCA revacularization on 10/01/2020, remains intubated after procedure.    Clinical Impression  Pt in bed upon arrival of PT, agreeable to evaluation at this time. 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 The pt now presents with limitations in functional mobility, strength, stability, coordination, and safety awareness due to above dx, and will continue to benefit from skilled PT to address these deficits. The pt was able to complete initial transfers and static standing at EOB with modA of 2, but requires maxA of 2 to maintain safe standing. The pt requires assist to maintain trunk upright, block his L knee, as well as cues for hip extension and R knee extension. The pt was unable to complete any stepping at this time, but will continue to benefit from skilled PT to progress functional strength and OOB mobility to facilitate return to prior levels of activity and mobility.   Interpreter: Duwayne Heck #[id number]     Follow Up Recommendations CIR    Equipment Recommendations   (defer to post acute)    Recommendations for Other Services Rehab consult     Precautions / Restrictions Precautions Precautions: Fall Precaution Comments: L sided hemipraesis Restrictions Weight Bearing Restrictions: No      Mobility  Bed Mobility Overal bed mobility: Needs Assistance Bed Mobility: Supine to Sit;Sit to Supine     Supine to sit: Mod assist;HOB  elevated Sit to supine: Mod assist;+2 for safety/equipment;+2 for physical assistance   General bed mobility comments: modA to BLE and to raise trunk to come to sitting EOB, then modA of 2 to safely return to bed for cleaning    Transfers Overall transfer level: Needs assistance Equipment used: 2 person hand held assist Transfers: Sit to/from Stand Sit to Stand: Max assist;+2 physical assistance;+2 safety/equipment;From elevated surface         General transfer comment: maxA to power up and maintain standing position. blocking of L knee, pt able to correct R knee and posture on command  Ambulation/Gait             General Gait Details: unable to attempt today   Modified Rankin (Stroke Patients Only) Modified Rankin (Stroke Patients Only) Pre-Morbid Rankin Score: No symptoms Modified Rankin: Severe disability     Balance Overall balance assessment: Needs assistance Sitting-balance support: Single extremity supported;Feet supported Sitting balance-Leahy Scale: Poor Sitting balance - Comments: requires UE support and minA from PT   Standing balance support: Bilateral upper extremity supported Standing balance-Leahy Scale: Zero Standing balance comment: maxA of 2 to maintain static stand                              Pertinent Vitals/Pain Pain Assessment: Faces Faces Pain Scale: Hurts little more Pain Location: R back/side Pain Descriptors / Indicators: Grimacing;Sore Pain Intervention(s): Limited activity within patient's tolerance;Monitored during session;Repositioned    Home Living Family/patient expects to be discharged to:: Private residence Living Arrangements:  Spouse/significant other;Children Available Help at Discharge: Family;Available 24 hours/day Type of Home: House Home Access: Stairs to enter Entrance Stairs-Rails: Can reach both Entrance Stairs-Number of Steps: 7-8 Home Layout: One level Home Equipment: None Additional Comments:  Information through translator, no family present to confirm    Prior Function Level of Independence: Independent         Comments: Independent without AD, 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  LLE Coordination: decreased fine motor;decreased gross motor    Cervical / Trunk Assessment Cervical / Trunk Assessment: Kyphotic  Communication   Communication: Expressive difficulties  Cognition Arousal/Alertness: Awake/alert Behavior During Therapy: Flat affect Overall Cognitive Status: Impaired/Different from baseline Area of Impairment: Attention;Memory;Following commands;Safety/judgement                   Current Attention Level: Focused Memory: Decreased recall of precautions;Decreased short-term memory Following Commands: Follows one step commands inconsistently;Follows one step commands with increased time Safety/Judgement: Decreased awareness of safety     General Comments: Pt follwoed commands with increased time, required repeated cues and prompts, even from interpreter to answer questions.       General Comments General comments (skin integrity, edema, etc.): VSS on RA    Exercises     Assessment/Plan    PT Assessment Patient needs continued PT services  PT Problem List Decreased strength;Decreased range of motion;Decreased activity tolerance;Decreased balance;Decreased mobility;Decreased coordination;Decreased cognition;Decreased safety awareness       PT Treatment Interventions DME  instruction;Gait training;Stair training;Functional mobility training;Therapeutic activities;Therapeutic exercise;Balance training;Neuromuscular re-education;Patient/family education    PT Goals (Current goals can be found in the Care Plan section)  Acute Rehab PT Goals Patient Stated Goal: none stated PT Goal Formulation: With patient Time For Goal Achievement: 10/07/2020 Potential to Achieve Goals: Fair    Frequency Min 4X/week    AM-PAC PT "6 Clicks" Mobility  Outcome Measure Help needed turning from your back to your side while in a flat bed without using bedrails?: A Lot Help needed moving from lying on your back to sitting on the side of a flat bed without using bedrails?: A Lot Help needed moving to and from a bed to a chair (including a wheelchair)?: Total Help needed standing up from a chair using your arms (e.g., wheelchair or bedside chair)?: A Lot Help needed to walk in hospital room?: Total Help needed climbing 3-5 steps with a railing? : Total 6 Click Score: 9    End of Session Equipment Utilized During Treatment: Gait belt Activity Tolerance: Patient limited by fatigue;Patient limited by pain Patient left: in bed;with call bell/phone within reach;with family/visitor present Nurse Communication: Mobility status PT Visit Diagnosis: Other abnormalities of gait and mobility (R26.89);Muscle weakness (generalized) (M62.81);Hemiplegia and hemiparesis Hemiplegia - Right/Left: Left Hemiplegia - dominant/non-dominant: Non-dominant Hemiplegia - caused by: Cerebral infarction    Time: 1701-1733 PT Time Calculation (min) (ACUTE ONLY): 32 min   Charges:   PT Evaluation $PT Eval Moderate Complexity: 1 Mod PT Treatments $Gait Training: 8-22 mins        Rolm Baptise, PT, DPT  Acute Rehabilitation Department Pager #: [phone]  Gaetana Michaelis 10/08/2020, 6:09 PM

## 2020-10-08 NOTE — Progress Notes (Signed)
STROKE TEAM PROGRESS NOTE   INTERVAL HISTORY Patient was extubated yesterday and has done well so far.  Conversation and neuro exam limited due to patient speaks only Mandarin.  He seems alert and interactive and follows simple commands but remains plegic on the left.  No family at the bedside.  Neurological exam appears unchanged.  Vital signs are stable.   OBJECTIVE Vitals:   10/08/20 0500 10/08/20 0600 10/08/20 0700 10/08/20 0800  BP: 106/64 (!) 88/55 114/70 120/85  Pulse: 87 78 86 89  Resp: (!) 23 (!) 23 (!) 27 (!) 28  Temp:    98.9 F (37.2 C)  TempSrc:    Oral  SpO2: 96% 96% 96% 98%   CBC:  Recent Labs  Lab 10/26/2020 1246 10/03/2020 2024 10/06/20 0522 10/06/20 0522 10/07/20 0527 10/08/20 0727  WBC 7.1  --  10.7*   < > 11.0* 8.4  NEUTROABS 5.2  --  8.2*  --   --   --   HGB 16.5   < > 13.5   < > 13.9 14.6  HCT 49.1   < > 40.4   < > 40.4 43.7  MCV 89.4  --  89.2   < > 89.0 88.8  PLT 291  --  232   < > 238 212   < > = values in this interval not displayed.   Basic Metabolic Panel:  Recent Labs  Lab 10/07/20 0527 10/08/20 0727  NA 139 139  K 3.0* 3.6  CL 109 101  CO2 21* 26  GLUCOSE 141* 130*  BUN 7* 18  CREATININE 0.81 1.39*  CALCIUM 7.7* 7.8*  MG  --  1.8  PHOS  --  4.9*   Lipid Panel:     Component Value Date/Time   CHOL 173 10/06/2020 0522   CHOL 216 (H) 03/09/2018 1620   TRIG 396 (H) 10/07/2020 0527   HDL 33 (L) 10/06/2020 0522   HDL 59 03/09/2018 1620   CHOLHDL 5.2 10/06/2020 0522   VLDL 55 (H) 10/06/2020 0522   LDLCALC 85 10/06/2020 0522   LDLCALC 136 (H) 03/09/2018 1620   HgbA1c:  Lab Results  Component Value Date   HGBA1C 5.8 (H) 10/06/2020   Urine Drug Screen: No results found for: LABOPIA, COCAINSCRNUR, LABBENZ, AMPHETMU, THCU, LABBARB  Alcohol Level No results found for: [facility]  IMAGING  CT HEAD WO CONTRAST 10/16/2020 1. No acute abnormality.  2. Minimal diffuse cerebral and cerebellar atrophy.  3. Mild chronic small vessel white  matter ischemic changes in both cerebral hemispheres.   CT Angio Head W or Wo Contrast CT Angio Neck W and/or Wo Contrast CT CEREBRAL PERFUSION W CONTRAST 10/23/2020 1. 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  2. Right MCA infarct with extensive penumbra.  3. 4 mm right supraclinoid ICA aneurysm.  4. Widely patent cervical carotid and vertebral arteries.  5. Aortic Atherosclerosis (ICD10-I70.0).   CT HEAD WO CONTRAST 10/20/2020 Hypodensity in the right lateral temporal lobe now shows mild hemorrhage or contrast enhancement. This is most consistent with acute infarct. There has been interval stenting of the right middle cerebral artery.   CT HEAD WO CONTRAST 10/15/2020 Right temporal lobe hypodensity compatible with acute infarct. Decreased hyperdensity within this stroke compared with 4 hours previously compatible with contrast staining rather than hemorrhage. Small area of acute infarct in the right parietal lobe now evident. Right MCA stent.   MRI brain: 10/06/20 1. Fairly extensive patchy acute/early subacute infarcts within the right MCA/watershed territory.  2. Additional small acute/early subacute infarcts within the dorsal right thalamus and left basal ganglia. 3. Background moderate chronic small vessel ischemic disease.  MRA head: 10/06/20 1. Interval stenting of the M1/M2 right middle cerebral artery. Flow related signal is present proximal and distal to the stent suggestive of stent patency. 2. 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  DG Abd 1 View 2020/10/25 Gastric catheter within the stomach kinked at the proximal side port. This could be withdrawn slightly S necessary.   Transthoracic Echocardiogram  10/06/2020 1. Left ventricular ejection fraction, by estimation, is 70 to 75%. The left ventricle has hyperdynamic function. The left ventricle has no regional wall motion abnormalities. Left ventricular diastolic  parameters are consistent with Grade II diastolic dysfunction (pseudonormalization). Elevated left atrial pressure.  2. Right ventricular systolic function is normal. The right ventricular size is normal.  3. The mitral valve is normal in structure. Mild mitral valve regurgitation. No evidence of mitral stenosis.  4. The aortic valve is normal in structure. Aortic valve regurgitation is not visualized. No aortic stenosis is present.  5. The inferior vena cava is dilated in size with <50% respiratory variability, suggesting right atrial pressure of 15 mmHg.  ECG - SR rate 70 BPM. (See cardiology reading for complete details)   PHYSICAL EXAM  Blood pressure 120/85, pulse 89, temperature 98.9 F (37.2 C), temperature source Oral, resp. rate (!) 28, SpO2 98 %.  Frail elderly [place] male. Afebrile. Head is nontraumatic. Neck is supple without bruit.    Cardiac exam no murmur or gallop. Lungs are clear to auscultation. Distal pulses are well felt. Neurological Exam : He is awake and appears interactive though speaks very little.Marland Kitchen PERRL, grimaces to stim, opens eyes spontaneously, conjugate midline gaze right preference,blinks on the right not on the left , + cough/gag, left lower facial weakness.  Purposeful and spontaneous movements on the right. , no movement left arm.  Does withdraw left legs to pain.  Gait deferred.     ASSESSMENT/PLAN Mr. Mark May is a 64 y.o. male with history of hypertension, BPH, sciatica, stomach ulcer status post surgery in the past presented to ER at [facility] for acute onset left-sided weakness, left facial droop and left hemianopia.  CT head and neck showed right M1 high-grade stenosis versus near occlusive thrombosis.  CT perfusion showed large penumbra.  Patient transferred to [facility] for thrombectomy.  He did not receive IV t-PA due to late presentation (>4.5 hours from time of onset). IR - complete occlusion of RT MCA M 1 seg - thrombectomy  ->TICI 3 revascularization with placement of rescue stent m4 mm x 24 mm Atlas for reocclusion with restoration of TICI 3 .  Stroke: Rt lateral temporal infarct s/p IR TICI3 R M1 w/ stent placement - thromboembolic d/t large vessel disease  CT Head - No acute abnormality. Minimal diffuse cerebral and cerebellar atrophy. Mild chronic small vessel white matter ischemic changes in both cerebral hemispheres.   CTA H&N - Severe distal right M1 stenosis or subocclusive thrombus. 4 mm right supraclinoid ICA aneurysm. Widely patent cervical carotid and vertebral arteries.   CT Perfusion - Right MCA infarct with extensive penumbra.   CT head - Hypodensity in the right lateral temporal lobe now shows mild hemorrhage or contrast enhancement. This is most consistent with acute infarct. There has been interval stenting of the right middle cerebral artery.   CT Head - Right temporal lobe hypodensity compatible with  acute infarct. Decreased hyperdensity within this stroke compared with 4 hours previously compatible with contrast staining rather than hemorrhage. Small area of acute infarct in the right parietal lobe now evident. Right MCA stent.  MRI head - Fairly extensive patchy acute/early subacute infarcts within the right MCA/watershed territory. Additional small acute/early subacute infarcts within the dorsal right thalamus and left basal ganglia.  MRA head -  Interval stenting of the M1/M2 right middle cerebral artery. Flow related signal is present proximal and distal to the stent suggestive of stent patency. 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  2D Echo - EF 70 - 75%. No cardiac source of emboli identified.   UE doppler neg DVT  Loyal Jacobson Virus 2 - negative  LDL - 85  HgbA1c - 5.8  VTE prophylaxis - SCDs  No antithrombotic prior to admission, now on aspirin 81 mg daily and Brilinta (ticagrelor) 90 mg bid following bolus with cangrelor. Continue.     Therapy recommendations:  pending  Disposition:  Pending  Transfer to the floor  Acute hypoxic respiratory failure, due to pulmonary edema, requiring BiPAP  Recurrent intubation for agitation, respiratory distress, hypoxia  Off temporary bipap    CCM on board  Transfer to floor if ok with CCM   Acute diastolic heart failure with pulmonary edema  Improved after lasix yesterday  temporary bipap at that time   Hypertension  Home BP meds: Amlodipine  Current BP meds: Lisinopril 10 mg daily   off Cleviprex  Stable . BP goal <180 . Long-term BP goal normotensive  Hyperlipidemia  Home Lipid lowering medication: none  LDL 85, goal < 70  Current lipid lowering medication when has po access: added Lipitor 20 (not high intensity as LDL almost at goal)  Continue statin at discharge  Dysphagia . Secondary to stroke . NPO . For MBSS today . TF if fails  . Speech on board   Other Stroke Risk Factors  Advanced age  Other Active Problems  Pt speaks Madarin    Aortic Atherosclerosis (ICD10-I70.0).   4 mm right supraclinoid ICA aneurysm.  Hypokalemia - 3.0 - supplement - recheck 3.6    Olecranon bursitis, left    Hospital day # 3  Plan mobilize out of bed.  Continue ongoing therapy consults.  Speech therapy to do modified barium swallow eval and if patient passes resume home medications and aspirin and Brilinta given right MCA stenting.  No family available at the bedside for discussion.  Discussed with Dr. Merrily Pew critical care medicine.  Greater than 50% time during this 35-minute visit was spent on counseling and coordination of care about his embolic stroke and discussion with care team. Delia Heady, MD   to contact Stroke Continuity provider, please refer to [email or web address]. After hours, contact General Neurology

## 2020-10-08 NOTE — Progress Notes (Signed)
NAME:  Mark May, MRN:  [id number], DOB:  18-Dec-1955, LOS: 3 ADMISSION DATE:  10/12/2020, CONSULTATION DATE: 10/12/2020 REFERRING MD: Dr. Curtis Sites, CHIEF COMPLAINT: Left-sided weakness  Brief History   64 year old male with hypertension and hyperlipidemia who presented with left-sided weakness, noted to have right MCA occlusion status post thrombectomy and stent placement.  Postprocedure CT showed contrast extravasation.  Past Medical History  Hypertension Peptic ulcer disease Gout  Significant Hospital Events   Mechanical thrombectomy of right MCA and stent 11/5  Consults:  IR Neurology PCCM  Procedures:  11/6 right femoral sheath was removed  Significant Diagnostic Tests:  11/5 CT head: No acute abnormality. Minimal diffuse cerebral and cerebellar atrophy. Mild chronic small vessel white matter ischemic changes in both cerebral hemispheres  11/5 CTA head and neck: Severe distal right M1 stenosis or subocclusive thrombus, Right MCA infarct with extensive penumbra, 4 mm right supraclinoid ICA aneurysm. Widely patent cervical carotid and vertebral arteries.  11/5 postprocedure CT head: Hypodensity in the right lateral temporal lobe now shows mild hemorrhage or contrast enhancement. This is most consistent with acute infarct. There has been interval stenting of the right middle cerebral artery.   Micro Data:  11/5 MRSA PCR negative  Antimicrobials:    Interim history/subjective:  Off BiPAP this morning.  Objective   Blood pressure 120/85, pulse 89, temperature 98.9 F (37.2 C), temperature source Oral, resp. rate (!) 28, SpO2 98 %.    Vent Mode: BIPAP;PCV FiO2 (%):  [40 %] 40 % Set Rate:  [15 bmp] 15 bmp PEEP:  [8 cmH20] 8 cmH20   Intake/Output Summary (Last 24 hours) at 10/08/2020 1013 Last data filed at 10/07/2020 1800 Gross per 24 hour  Intake 549.51 ml  Output 3450 ml  Net -2900.49 ml   There were no vitals filed for this visit.  Examination:    Physical exam: General: middle aged man sitting up in bed in NAD HEENT: [place]/AT, eyes anicteric, NGT in place Neuro: opens eyes and tracks. Follows commands on the R, withdraws LLE to pain, grimaces but doesn't withdraw LUE with pain. Nonverbal. Chest: CTAB, breathing comfortably on RA. Heart: RRR, no murmurs Abdomen: soft, NT, ND Extremities: no c/c/e Skin: warm, dry, no wounds  Resolved Hospital Problem list     Assessment & Plan:  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 and right MCA stent placement by IR with TICI 3 -con't statin, DAPT -con't neuro monitoring and neuroprotective measures -start TF via NGT -SLP, PT, OT consults replaced  Hypertension Hyperlipidemia Acute hypoxic respiratory failure due to pulmonary edema- resolved Acute diastolic heart failure with pulmonary edema -monitor on telemetry -maintain euvolemia -strict I/Os  Hypokalemia -additional K+   We will sign off. Please call with questions.  Best practice:  Diet: Speech and swallow evaluation Pain/Anxiety/Delirium protocol (if indicated): N/A VAP protocol (if indicated): N/A DVT prophylaxis: SCD GI prophylaxis: Pepcid Glucose control: SSI Mobility: Bedrest Code Status: Full code Family Communication: Per primary team Disposition: per primary   Labs   CBC: Recent Labs  Lab 10/27/2020 1246 10/25/2020 2024 10/06/20 0522 10/07/20 0527 10/08/20 0727  WBC 7.1  --  10.7* 11.0* 8.4  NEUTROABS 5.2  --  8.2*  --   --   HGB 16.5 14.6 13.5 13.9 14.6  HCT 49.1 43.0 40.4 40.4 43.7  MCV 89.4  --  89.2 89.0 88.8  PLT 291  --  232 238 212    Basic Metabolic Panel: Recent Labs  Lab 10/20/2020 1246  10/15/2020 2024 10/06/20 0522 10/07/20 0527 10/08/20 0727  NA 142 139 139 139 139  K 4.3 3.5 3.6 3.0* 3.6  CL 102  --  106 109 101  CO2 29  --  22 21* 26  GLUCOSE 110*  --  112* 141* 130*  BUN 12  --  10 7* 18  CREATININE 0.89  --  1.02 0.81 1.39*  CALCIUM 8.6*  --  8.2* 7.7* 7.8*  MG   --   --   --   --  1.8  PHOS  --   --   --   --  4.9*   GFR: Estimated Creatinine Clearance: 42.5 mL/min (A) (by C-G formula based on SCr of 1.39 mg/dL (H)). Recent Labs  Lab 10/16/2020 1246 10/06/20 0522 10/07/20 0527 10/08/20 0727  WBC 7.1 10.7* 11.0* 8.4    Liver Function Tests: Recent Labs  Lab 10/02/2020 1246 10/08/20 0727  AST 24 53*  ALT 32 38  ALKPHOS 113 121  BILITOT 0.9 2.4*  PROT 7.3 5.9*  ALBUMIN 4.3 2.6*   No results for input(s): LIPASE, AMYLASE in the last 168 hours. No results for input(s): AMMONIA in the last 168 hours.  ABG    Component Value Date/Time   PHART 7.415 10/20/2020 2024   PCO2ART 38.0 10/04/2020 2024   PO2ART 205 (H) 10/11/2020 2024   HCO3 24.4 10/15/2020 2024   TCO2 26 10/27/2020 2024   O2SAT 100.0 10/17/2020 2024     Coagulation Profile: Recent Labs  Lab 10/23/2020 1246  INR 0.9    Cardiac Enzymes: No results for input(s): CKTOTAL, CKMB, CKMBINDEX, TROPONINI in the last 168 hours.  HbA1C: Hgb A1c MFr Bld  Date/Time Value Ref Range Status  10/06/2020 05:22 AM 5.8 (H) 4.8 - 5.6 % Final    Comment:    (NOTE) Pre diabetes:          5.7%-6.4%  Diabetes:              >6.4%  Glycemic control for   <7.0% adults with diabetes     CBG: Recent Labs  Lab 10/07/20 2035 10/08/20 0034 10/08/20 0438 10/08/20 0750  GLUCAP 141* 150* 157* 135*     Steffanie Dunn, DO 10/08/20 10:44 AM [facility]

## 2020-10-08 NOTE — Progress Notes (Signed)
Patient arrived on unit via bed. Patient and wife oriented to room and unit. NAD noted at this time. Will continue to monitor.

## 2020-10-08 NOTE — Progress Notes (Signed)
Report called to [employer] at [employer] 07

## 2020-10-08 NOTE — Discharge Instructions (Signed)
Femoral Site Care This sheet gives you information about how to care for yourself after your procedure. Your health care provider may also give you more specific instructions. If you have problems or questions, contact your health care provider. What can I expect after the procedure? After the procedure, it is common to have:  Bruising that usually fades within 1-2 weeks.  Tenderness at the site. Follow these instructions at home: Wound care 1. Follow instructions from your health care provider about how to take care of your insertion site. Make sure you: ? Wash your hands with soap and water before you change your bandage (dressing). If soap and water are not available, use hand sanitizer. ? Change your dressing as directed- pressure dressing removed 24 hours post-procedure (and switch for bandaid), bandaid removed 72 hours post-procedure 2. Do not take baths, swim, or use a hot tub for 7 days post-procedure. 3. You may shower 48 hours after the procedure or as told by your health care provider. ? Gently wash the site with plain soap and water. ? Pat the area dry with a clean towel. ? Do not rub the site. This may cause bleeding. 4. Check your site every day for signs of infection. Check for: ? Redness, swelling, or pain. ? Fluid or blood. ? Warmth. ? Pus or a bad smell. Activity  Do not stoop, bend, or lift anything that is heavier than 10 lb (4.5 kg) for 2 weeks post-procedure.  Do not drive self for 2 weeks post-procedure. Contact a health care provider if you have:  A fever or chills.  You have redness, swelling, or pain around your insertion site. Get help right away if:  The catheter insertion area swells very fast.  You pass out.  You suddenly start to sweat or your skin gets clammy.  The catheter insertion area is bleeding, and the bleeding does not stop when you hold steady pressure on the area.  The area near or just beyond the catheter insertion site becomes  pale, cool, tingly, or numb. These symptoms may represent a serious problem that is an emergency. Do not wait to see if the symptoms will go away. Get medical help right away. Call your local emergency services (911 in the [place] Do not drive yourself to the hospital.  This information is not intended to replace advice given to you by your health care provider. Make sure you discuss any questions you have with your health care provider. Document Revised: 11/30/2017 Document Reviewed: 11/30/2017 Elsevier Patient Education  2020 [employer] 

## 2020-10-09 ENCOUNTER — Inpatient Hospital Stay (HOSPITAL_COMMUNITY): Payer: 59

## 2020-10-09 DIAGNOSIS — I6601 Occlusion and stenosis of right middle cerebral artery: Secondary | ICD-10-CM | POA: Diagnosis not present

## 2020-10-09 DIAGNOSIS — G8194 Hemiplegia, unspecified affecting left nondominant side: Secondary | ICD-10-CM | POA: Diagnosis not present

## 2020-10-09 LAB — CBC
HCT: 43.2 % (ref 39.0–52.0)
Hemoglobin: 14.6 g/dL (ref 13.0–17.0)
MCH: 30.1 pg (ref 26.0–34.0)
MCHC: 33.8 g/dL (ref 30.0–36.0)
MCV: 89.1 fL (ref 80.0–100.0)
Platelets: 199 10*3/uL (ref 150–400)
RBC: 4.85 MIL/uL (ref 4.22–5.81)
RDW: 13.4 % (ref 11.5–15.5)
WBC: 6.5 10*3/uL (ref 4.0–10.5)
nRBC: 0 % (ref 0.0–0.2)

## 2020-10-09 LAB — BASIC METABOLIC PANEL
Anion gap: 11 (ref 5–15)
BUN: 27 mg/dL — ABNORMAL HIGH (ref 8–23)
CO2: 24 mmol/L (ref 22–32)
Calcium: 8.2 mg/dL — ABNORMAL LOW (ref 8.9–10.3)
Chloride: 103 mmol/L (ref 98–111)
Creatinine, Ser: 1.48 mg/dL — ABNORMAL HIGH (ref 0.61–1.24)
GFR, Estimated: 52 mL/min — ABNORMAL LOW (ref >60)
Glucose, Bld: 151 mg/dL — ABNORMAL HIGH (ref 70–99)
Potassium: 3.6 mmol/L (ref 3.5–5.1)
Sodium: 138 mmol/L (ref 135–145)

## 2020-10-09 LAB — TROPONIN I (HIGH SENSITIVITY)
Troponin I (High Sensitivity): 8 ng/L (ref <18)
Troponin I (High Sensitivity): 6 ng/L (ref <18)

## 2020-10-09 MED ORDER — SODIUM CHLORIDE 0.9 % IV SOLN: INTRAVENOUS | Status: DC

## 2020-10-09 MED ORDER — LOPERAMIDE HCL 2 MG PO CAPS
2.0000 mg | ORAL_CAPSULE | Freq: Three times a day (TID) | ORAL | Status: DC
  Administered 2020-10-09 – 2020-10-11 (×6): 2 mg via ORAL
  Filled 2020-10-09 (×6): qty 1

## 2020-10-09 NOTE — Consult Note (Signed)
Physical Medicine and Rehabilitation Consult Reason for Consult: Left side weakness with facial droop Referring Physician: Dr. Pearlean Brownie   HPI: Mark May is a 64 y.o. right-handed non-English-speaking Asian male with history of gout, hypertension, BPH, sciatica, partial gastrectomy for PUD. Per chart review lives with spouse and children. 1 level home 7 steps to entry. Reportedly independent prior to admission. Presented 10/24/2020 with left-sided weakness and facial droop. Cranial CT scan negative for acute changes. CT angiogram of head and neck severe distal right M1 stenosis or subocclusive thrombus. Right MCA infarct with extensive penumbra. 4 mm right supraclinoid ICA stenosis. Admission chemistries unremarkable except glucose 110, hemoglobin 16.5, hemoglobin A1c 5.8. Patient underwent thrombectomy revascularization with stent placement per interventional radiology. MRI/MRA shows fairly extensive patchy acute early subacute infarcts within the right MCA watershed territory. Additional small acute early subacute infarcts within the dorsal right thalamus and left basal ganglia. Echocardiogram with ejection fraction of 70 to 75% grade 2 diastolic dysfunction. Venous Doppler left upper extremity negative for DVT. Awaiting plan for TEE. Currently maintained on aspirin as well as Brilinta for CVA prophylaxis. Subcutaneous Lovenox for DVT prophylaxis. Tolerating regular diet. Therapy evaluations completed with recommendations of physical medicine rehab consult.   Review of Systems  Unable to perform ROS: Language   Past Medical History:  Diagnosis Date  . Gout   . Hypertension    Past Surgical History:  Procedure Laterality Date  . PARTIAL GASTRECTOMY  1990   done in [place] for PUD  . PROSTATE BIOPSY  ~ 2005   negative for cancer (done in [place])  . RADIOLOGY WITH ANESTHESIA N/A 10/09/2020   Procedure: IR WITH ANESTHESIA;  Surgeon: Radiologist, Medication, MD;  Location: [facility] OR;  Service:  Radiology;  Laterality: N/A;   Family History  Problem Relation Age of Onset  . Diabetes Mother   . Diabetes Father    Social History:  reports that he has never smoked. He has never used smokeless tobacco. He reports that he does not drink alcohol and does not use drugs. Allergies: No Known Allergies Medications Prior to Admission  Medication Sig Dispense Refill  . allopurinol (ZYLOPRIM) 100 MG tablet Take 3 tablets (300 mg total) by mouth daily. 90 tablet 3  . amLODipine (NORVASC) 10 MG tablet Take 1 tablet (10 mg total) by mouth daily. 30 tablet 5  . meclizine (ANTIVERT) 25 MG tablet Take 1 tablet (25 mg total) by mouth 3 (three) times daily as needed for dizziness. 30 tablet 0  . meloxicam (MOBIC) 15 MG tablet Take 1 tablet (15 mg total) by mouth daily. 30 tablet 5  . tamsulosin (FLOMAX) 0.4 MG CAPS capsule Take 1 capsule (0.4 mg total) by mouth daily after supper. 30 capsule 5  . tizanidine (ZANAFLEX) 2 MG capsule Take 2 mg by mouth 3 (three) times daily.       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 [employer] Functional Status:  Mobility: Bed Mobility Overal bed mobility: Needs Assistance Bed Mobility: Supine to Sit, Sit to Supine Supine to sit: Mod assist,  HOB elevated Sit to supine: Mod assist, +2 for safety/equipment, +2 for physical assistance General bed mobility comments: modA to BLE and to raise trunk to come to sitting EOB,  then modA of 2 to safely return to bed for cleaning Transfers Overall transfer level: Needs assistance Equipment used: 2 person hand held assist Transfers: Sit to/from Stand Sit to Stand: Max assist, +2 physical assistance, +2 safety/equipment, From elevated surface General transfer comment: maxA to power up and maintain standing position. blocking of L knee, pt able to correct R knee and posture on command Ambulation/Gait General Gait Details: unable to attempt today    ADL:    Cognition: Cognition Overall Cognitive Status: Impaired/Different from baseline Orientation Level: Oriented to person, Oriented to place, Oriented to time Cognition Arousal/Alertness: Awake/alert Behavior During Therapy: Flat affect Overall Cognitive Status: Impaired/Different from baseline Area of Impairment: Attention, Memory, Following commands, Safety/judgement Current Attention Level: Focused Memory: Decreased recall of precautions, Decreased short-term memory Following Commands: Follows one step commands inconsistently, Follows one step commands with increased time Safety/Judgement: Decreased awareness of safety General Comments: Pt follwoed commands with increased time, required repeated cues and prompts, even from interpreter to answer questions.   Blood pressure 122/81, pulse 96, temperature 98.1 F (36.7 C), temperature source Oral, resp. rate 20, SpO2 97 %. Physical Exam Constitutional:      Comments: Frail appearing  HENT:     Head: Normocephalic.     Nose: Nose normal.     Mouth/Throat:     Mouth: Mucous membranes are dry.  Eyes:     Extraocular Movements: Extraocular movements intact.     Pupils: Pupils are equal, round, and reactive to light.  Cardiovascular:     Rate and Rhythm: Normal rate.     Pulses: Normal pulses.  Pulmonary:     Effort: Pulmonary effort is normal.  Abdominal:     Palpations: Abdomen is soft.  Musculoskeletal:     Cervical back: Normal range of motion.   Neurological:     Comments: Patient is alert and makes eye contact with examiner. Non-English-speaking/speaks primarily Mandarin. Follows simple demonstrated commands. Very dysarthric and dysphonic. Indicated that he was thirsty and wanted a drink. Left central 7, poor oro-motor control. Left inattention. LUE 0/5. LLE tr? To 0/5. Sensed pain R>L  Psychiatric:     Comments: Flat, distracted     Results for orders placed or performed during the hospital encounter of 10/23/2020 (from the past 24 hour(s))  Platelet inhibition p2y12 (Not at [facility])     Status: Abnormal   Collection Time: 10/08/20 10:33 AM  Result Value Ref Range   Platelet Function  P2Y12 43 (L) 182 - 335 PRU  Glucose, capillary     Status: Abnormal   Collection Time: 10/08/20 11:37 AM  Result Value Ref Range   Glucose-Capillary 118 (H) 70 - 99 mg/dL  Glucose, capillary     Status: Abnormal   Collection Time: 10/08/20  3:47 PM  Result Value Ref Range   Glucose-Capillary 150 (H) 70 - 99 mg/dL  CBC     Status: None   Collection Time: 10/09/20  3:29 AM  Result Value Ref Range   WBC 6.5 4.0 - 10.5 K/uL   RBC 4.85 4.22 - 5.81 MIL/uL   Hemoglobin 14.6 13.0 - 17.0 g/dL   HCT 40.9 39 - 52 %   MCV 89.1 80.0 - 100.0 fL   MCH 30.1 26.0 - 34.0 pg   MCHC 33.8 30.0 - 36.0 g/dL  RDW 13.4 11.5 - 15.5 %   Platelets 199 150 - 400 K/uL   nRBC 0.0 0.0 - 0.2 %  Basic metabolic panel     Status: Abnormal   Collection Time: 10/09/20  3:29 AM  Result Value Ref Range   Sodium 138 135 - 145 mmol/L   Potassium 3.6 3.5 - 5.1 mmol/L   Chloride 103 98 - 111 mmol/L   CO2 24 22 - 32 mmol/L   Glucose, Bld 151 (H) 70 - 99 mg/dL   BUN 27 (H) 8 - 23 mg/dL   Creatinine, Ser 3.29 (H) 0.61 - 1.24 mg/dL   Calcium 8.2 (L) 8.9 - 10.3 mg/dL   GFR, Estimated 52 (L) >60 mL/min   Anion gap 11 5 - 15   DG Elbow 2 Views Left  Result Date: 10/07/2020 CLINICAL DATA:  Posterior left elbow swelling and decreased range of motion. Post stroke. No known injury.  EXAM: LEFT ELBOW - 2 VIEW COMPARISON:  None. FINDINGS: Extended positioning on the lateral view precludes accurate assessment for joint effusion. No appreciable joint effusion. No fracture or dislocation. No focal osseous lesions. Small enthesophyte at the posterior olecranon. Soft tissue swelling overlying the olecranon. No significant arthropathy. No osseous erosions or periosteal reaction. IMPRESSION: Soft tissue swelling overlying the olecranon, suggesting olecranon bursitis. Small enthesophyte at the posterior olecranon. No acute osseous abnormality. Electronically Signed   By: Delbert Phenix M.D.   On: 10/07/2020 16:09   DG Abd Portable 1V  Result Date: 10/07/2020 CLINICAL DATA:  NG tube placement EXAM: PORTABLE ABDOMEN - 1 VIEW COMPARISON:  October 05, 2020 FINDINGS: The enteric tube projects over the gastric body. The tube is likely kinked at the side hole. The bowel gas pattern is nonobstructive. There is probable bilateral nephrolithiasis. IMPRESSION: Enteric tube projects over the gastric body. The tube is likely kinked at the side hole. Probable bilateral nephrolithiasis. Electronically Signed   By: Katherine Mantle M.D.   On: 10/07/2020 22:50   DG Swallowing Func-Speech Pathology  Result Date: 10/08/2020 Completed by Royetta Crochet SLP student Supervised and reviewed by Harlon Ditty MA CCC-SLP Objective Swallowing Evaluation: Type of Study: MBS-Modified Barium Swallow Study  Patient Details Name: Matthieu Loftus MRN: [id number] Date of Birth: 12-08-1955 Today's Date: 10/08/2020 Time: SLP Start Time (ACUTE ONLY): 1250 -SLP Stop Time (ACUTE ONLY): 1307 SLP Time Calculation (min) (ACUTE ONLY): 17 min Past Medical History: Past Medical History: Diagnosis Date . Gout  . Hypertension  Past Surgical History: Past Surgical History: Procedure Laterality Date . PARTIAL GASTRECTOMY  1990  done in [place] for PUD . PROSTATE BIOPSY  ~ 2005  negative for cancer (done in [place]) . RADIOLOGY WITH ANESTHESIA N/A 10/06/2020   Procedure: IR WITH ANESTHESIA;  Surgeon: Radiologist, Medication, MD;  Location: [facility] OR;  Service: Radiology;  Laterality: N/A; HPI: Mr. Barnaby Rippeon is a 64 y.o. male with history of hypertension, BPH, sciatica, stomach ulcer status post surgery in the past presented to ER at [facility] for acute onset left-sided weakness, left facial droop and left hemianopia. CT head and neck showed right M1 high-grade stenosis versus near occlusive thrombosis. CT perfusion showed large penumbra. Patient transferred to [facility] for thrombectomy. Pt intubated 11/5-11/7 with post-extubation respiratory distress.  No data recorded Assessment / Plan / Recommendation CHL IP CLINICAL IMPRESSIONS 10/08/2020 Clinical Impression Pt was seen for MBS with son present for translating. MBS revealed a pharyngeal dysphagia characterized by silent aspiration, pharyngeal residue, and multiple swallows. Given  pt's presentation during MBS, suspect he has pharyngeal weakness that is contributing to his dysphagia. Silent aspiration of thin liquid was observed before the pt had achieved full laryngeal closure. Pt's volitional cough was observed to be weak and unsuccessful. No other instances of aspiration were observed. Moderate amounts of pharyngeal residue were observed in the valleulae and pyriforms across POs. SLP educated pt in use of an effortful swallow, which was successful in reducing the amount of pharyngeal residue. Pt was also observed to utilize multiple swallows across POs. Recommend dys 3 diet with nectar thick liquid and use of effortful swallow. SLP will continue to f/u for diet toleration. SLP Visit Diagnosis Dysphagia, pharyngeal phase (R13.13) Attention and concentration deficit following -- Frontal lobe and executive function deficit following -- Impact on safety and function Moderate aspiration risk   CHL IP TREATMENT RECOMMENDATION 10/08/2020 Treatment Recommendations Therapy as outlined in treatment plan below    Prognosis 10/08/2020 Prognosis for Safe Diet Advancement Good Barriers to Reach Goals -- Barriers/Prognosis Comment -- CHL IP DIET RECOMMENDATION 10/08/2020 SLP Diet Recommendations Dysphagia 3 (Mech soft) solids;Nectar thick liquid Liquid Administration via Cup;Straw Medication Administration Crushed with puree Compensations Effortful swallow Postural Changes Seated upright at 90 degrees   CHL IP OTHER RECOMMENDATIONS 10/08/2020 Recommended Consults -- Oral Care Recommendations Oral care BID Other Recommendations Order thickener from pharmacy   No flowsheet data found.  CHL IP FREQUENCY AND DURATION 10/08/2020 Speech Therapy Frequency (ACUTE ONLY) min 2x/week Treatment Duration 2 weeks      CHL IP ORAL PHASE 10/08/2020 Oral Phase WFL Oral - Pudding Teaspoon -- Oral - Pudding Cup -- Oral - Honey Teaspoon -- Oral - Honey Cup -- Oral - Nectar Teaspoon -- Oral - Nectar Cup -- Oral - Nectar Straw -- Oral - Thin Teaspoon -- Oral - Thin Cup -- Oral - Thin Straw -- Oral - Puree -- Oral - Mech Soft -- Oral - Regular -- Oral - Multi-Consistency -- Oral - Pill -- Oral Phase - Comment --  CHL IP PHARYNGEAL PHASE 10/08/2020 Pharyngeal Phase Impaired Pharyngeal- Pudding Teaspoon -- Pharyngeal -- Pharyngeal- Pudding Cup -- Pharyngeal -- Pharyngeal- Honey Teaspoon -- Pharyngeal -- Pharyngeal- Honey Cup -- Pharyngeal -- Pharyngeal- Nectar Teaspoon -- Pharyngeal -- Pharyngeal- Nectar Cup -- Pharyngeal -- Pharyngeal- Nectar Straw Pharyngeal residue - valleculae;Pharyngeal residue - pyriform;Compensatory strategies attempted (with notebox) Pharyngeal -- Pharyngeal- Thin Teaspoon -- Pharyngeal -- Pharyngeal- Thin Cup Compensatory strategies attempted (with notebox);Pharyngeal residue - valleculae;Pharyngeal residue - pyriform;Penetration/Aspiration before swallow;Penetration/Aspiration during swallow;Moderate aspiration Pharyngeal Material enters airway, passes BELOW cords without attempt by patient to eject out (silent aspiration)  Pharyngeal- Thin Straw Penetration/Aspiration during swallow;Trace aspiration;Pharyngeal residue - valleculae;Pharyngeal residue - pyriform;Compensatory strategies attempted (with notebox) Pharyngeal Material enters airway, remains ABOVE vocal cords and not ejected out Pharyngeal- Puree Pharyngeal residue - valleculae;Pharyngeal residue - pyriform Pharyngeal -- Pharyngeal- Mechanical Soft -- Pharyngeal -- Pharyngeal- Regular Pharyngeal residue - valleculae;Pharyngeal residue - pyriform Pharyngeal -- Pharyngeal- Multi-consistency -- Pharyngeal -- Pharyngeal- Pill -- Pharyngeal -- Pharyngeal Comment --  No flowsheet data found. DeBlois, Riley NearingBonnie Caroline 10/08/2020, 2:16 PM              VAS US UPPER EXTREMITY VENOUS DUPLEX  Result Date: 10/08/2020 UPPER VENOUS STUDY  Indications: Swelling Risk Factors: None identified. Comparison Study: No prior studies. Performing Technologist: Chanda BusingGregory Collins RVT  Examination Guidelines: A complete evaluation includes B-mode imaging, spectral Doppler, color Doppler, and power Doppler as needed of all accessible portions of each vessel. Bilateral testing is considered  an integral part of a complete examination. Limited examinations for reoccurring indications may be performed as noted.  Right Findings: +----------+------------+---------+-----------+----------+-------+ RIGHT     CompressiblePhasicitySpontaneousPropertiesSummary +----------+------------+---------+-----------+----------+-------+ Subclavian    Full       Yes       Yes                      +----------+------------+---------+-----------+----------+-------+  Left Findings: +----------+------------+---------+-----------+----------+-----------------+ LEFT      CompressiblePhasicitySpontaneousProperties     Summary      +----------+------------+---------+-----------+----------+-----------------+ IJV           Full       Yes       Yes                                 +----------+------------+---------+-----------+----------+-----------------+ Subclavian    Full       Yes       Yes                                +----------+------------+---------+-----------+----------+-----------------+ Axillary      Full       Yes       Yes                                +----------+------------+---------+-----------+----------+-----------------+ Brachial      Full       Yes       Yes                                +----------+------------+---------+-----------+----------+-----------------+ Radial        Full                                                    +----------+------------+---------+-----------+----------+-----------------+ Ulnar         Full                                                    +----------+------------+---------+-----------+----------+-----------------+ Cephalic      None                                  Age Indeterminate +----------+------------+---------+-----------+----------+-----------------+ Basilic       Full                                                    +----------+------------+---------+-----------+----------+-----------------+  Summary:  Right: No evidence of thrombosis in the subclavian.  Left: No evidence of deep vein thrombosis in the upper extremity. Findings consistent with age indeterminate superficial vein thrombosis involving the left cephalic vein.  *See table(s) above for measurements and observations.  Diagnosing physician: Sherald Hess MD Electronically signed by Sherald Hess MD on 10/08/2020 at 3:28:16 PM.    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 Does the need for close, 24 hr/day medical supervision in concert with the patient's rehab needs make it unreasonable for this patient to be served in a less intensive setting? Yes 2. Co-Morbidities requiring supervision/potential complications: HTN,  gout, post-stroke sequelae 3. Due to bladder management, bowel management, safety, skin/wound care, disease management, medication administration, pain management and patient education, does the patient require 24 hr/day rehab nursing? Yes 4. Does the patient require coordinated care of a physician, rehab nurse, therapy disciplines of PT, OT, SLP to address physical and functional deficits in the context of the above medical diagnosis(es)? Yes Addressing deficits in the following areas: balance, endurance, locomotion, strength, transferring, bowel/bladder control, bathing, dressing, feeding, grooming, toileting, cognition, speech, swallowing and psychosocial support 5. Can the patient actively participate in an intensive therapy program of at least 3 hrs of therapy per day at least 5 days per week? Yes and Potentially 6. The potential for patient to make measurable gains while on inpatient rehab is excellent 7. Anticipated functional outcomes upon discharge from inpatient rehab are supervision and min assist  with PT, supervision and min assist with OT, supervision and min assist with SLP. 8. Estimated rehab length of stay to reach the above functional goals is: 24-28 days 9. Anticipated discharge destination: Home 10. Overall Rehab/Functional Prognosis: good  RECOMMENDATIONS: This patient's condition is appropriate for continued rehabilitative care in the following setting: CIR Patient has agreed to participate in recommended program. N/A Note that insurance prior authorization may be required for reimbursement for recommended care.  Comment: Rehab Admissions Coordinator to follow up.  Thanks,  Ranelle Oyster, MD, [place] [facility]  I have personally performed a face to face diagnostic evaluation of this patient. Additionally, I have examined pertinent labs and radiographic images. I have reviewed and concur with the physician assistant's documentation above.    Mcarthur Rossetti Angiulli, PA-C 10/09/2020

## 2020-10-09 NOTE — Plan of Care (Signed)

## 2020-10-09 NOTE — Progress Notes (Signed)
STROKE TEAM PROGRESS NOTE   INTERVAL HISTORY Patient passed swallow eval yesterday.  He was transferred to the floor.  His sleeping comfortably in bed.  He can be aroused with some difficulty.  He remains dysarthric with left hemiparesis.  Neurological exam is unchanged.  Vital signs are stable.  CBC is unremarkable.  BMP significant only for slight increase in creatinine.  OBJECTIVE Vitals:   10/08/20 1956 10/08/20 2341 10/09/20 0354 10/09/20 0805  BP: 137/90 123/74 (!) 143/82 122/81  Pulse: (!) 106 (!) 102 93 96  Resp: 19 18 17 20   Temp: 98.2 F (36.8 C) 98.4 F (36.9 C) 98.4 F (36.9 C) 98.1 F (36.7 C)  TempSrc: Oral Oral Oral Oral  SpO2: 98% 97% 97% 97%   CBC:  Recent Labs  Lab 10/24/2020 1246 10/12/2020 2024 10/06/20 0522 10/07/20 0527 10/08/20 0727 10/09/20 0329  WBC 7.1  --  10.7*   < > 8.4 6.5  NEUTROABS 5.2  --  8.2*  --   --   --   HGB 16.5   < > 13.5   < > 14.6 14.6  HCT 49.1   < > 40.4   < > 43.7 43.2  MCV 89.4  --  89.2   < > 88.8 89.1  PLT 291  --  232   < > 212 199   < > = values in this interval not displayed.   Basic Metabolic Panel:  Recent Labs  Lab 10/08/20 0727 10/09/20 0329  NA 139 138  K 3.6 3.6  CL 101 103  CO2 26 24  GLUCOSE 130* 151*  BUN 18 27*  CREATININE 1.39* 1.48*  CALCIUM 7.8* 8.2*  MG 1.8  --   PHOS 4.9*  --    Lipid Panel:     Component Value Date/Time   CHOL 173 10/06/2020 0522   CHOL 216 (H) 03/09/2018 1620   TRIG 396 (H) 10/07/2020 0527   HDL 33 (L) 10/06/2020 0522   HDL 59 03/09/2018 1620   CHOLHDL 5.2 10/06/2020 0522   VLDL 55 (H) 10/06/2020 0522   LDLCALC 85 10/06/2020 0522   LDLCALC 136 (H) 03/09/2018 1620   HgbA1c:  Lab Results  Component Value Date   HGBA1C 5.8 (H) 10/06/2020   Urine Drug Screen: No results found for: LABOPIA, COCAINSCRNUR, LABBENZ, AMPHETMU, THCU, LABBARB  Alcohol Level No results found for: [facility]  IMAGING  CT HEAD WO CONTRAST 10/06/2020 1. No acute abnormality.  2. Minimal diffuse  cerebral and cerebellar atrophy.  3. Mild chronic small vessel white matter ischemic changes in both cerebral hemispheres.   CT Angio Head W or Wo Contrast CT Angio Neck W and/or Wo Contrast CT CEREBRAL PERFUSION W CONTRAST 10/18/2020 1. 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  2. Right MCA infarct with extensive penumbra.  3. 4 mm right supraclinoid ICA aneurysm.  4. Widely patent cervical carotid and vertebral arteries.  5. Aortic Atherosclerosis (ICD10-I70.0).   CT HEAD WO CONTRAST 10/04/2020 Hypodensity in the right lateral temporal lobe now shows mild hemorrhage or contrast enhancement. This is most consistent with acute infarct. There has been interval stenting of the right middle cerebral artery.   CT HEAD WO CONTRAST 10/28/2020 Right temporal lobe hypodensity compatible with acute infarct. Decreased hyperdensity within this stroke compared with 4 hours previously compatible with contrast staining rather than hemorrhage. Small area of acute infarct in the right parietal lobe now evident. Right MCA stent.   MRI brain: 10/06/20 1. Fairly extensive patchy  acute/early subacute infarcts within the right MCA/watershed territory.  2. Additional small acute/early subacute infarcts within the dorsal right thalamus and left basal ganglia. 3. Background moderate chronic small vessel ischemic disease.  MRA head: 10/06/20 1. Interval stenting of the M1/M2 right middle cerebral artery. Flow related signal is present proximal and distal to the stent suggestive of stent patency. 2. 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  DG Abd 1 View 10/12/2020 Gastric catheter within the stomach kinked at the proximal side port. This could be withdrawn slightly S necessary.   Transthoracic Echocardiogram  10/06/2020 1. Left ventricular ejection fraction, by estimation, is 70 to 75%. The left ventricle has hyperdynamic function. The left ventricle has  no regional wall motion abnormalities. Left ventricular diastolic parameters are consistent with Grade II diastolic dysfunction (pseudonormalization). Elevated left atrial pressure.  2. Right ventricular systolic function is normal. The right ventricular size is normal.  3. The mitral valve is normal in structure. Mild mitral valve regurgitation. No evidence of mitral stenosis.  4. The aortic valve is normal in structure. Aortic valve regurgitation is not visualized. No aortic stenosis is present.  5. The inferior vena cava is dilated in size with <50% respiratory variability, suggesting right atrial pressure of 15 mmHg.  ECG - SR rate 70 BPM. (See cardiology reading for complete details)   PHYSICAL EXAM     Blood pressure 122/81, pulse 96, temperature 98.1 F (36.7 C), temperature source Oral, resp. rate 20, SpO2 97 %.  Frail elderly [place] male. Afebrile. Head is nontraumatic. Neck is supple without bruit.    Cardiac exam no murmur or gallop. Lungs are clear to auscultation. Distal pulses are well felt. Neurological Exam : He is awake and appears interactive though speaks very little.Marland Kitchen PERRL, grimaces to stim, opens eyes spontaneously, conjugate midline gaze right preference,blinks on the right not on the left , + cough/gag, left lower facial weakness.  Purposeful and spontaneous movements on the right. , no movement left arm.  Does withdraw left legs to pain.  Gait deferred.     ASSESSMENT/PLAN [name] is a 64 y.o. male with history of hypertension, BPH, sciatica, stomach ulcer status post surgery in the past presented to ER at [facility] for acute onset left-sided weakness, left facial droop and left hemianopia.  CT head and neck showed right M1 high-grade stenosis versus near occlusive thrombosis.  CT perfusion showed large penumbra.  Patient transferred to [facility] for thrombectomy.  He did not receive IV t-PA due to late presentation (>4.5 hours from time of  onset). IR - complete occlusion of RT MCA M 1 seg - thrombectomy ->TICI 3 revascularization with placement of rescue stent m4 mm x 24 mm Atlas for reocclusion with restoration of TICI 3 .  Stroke: Rt lateral temporal infarct s/p IR TICI3 R M1 w/ stent placement - thromboembolic d/t large vessel disease  CT Head - No acute abnormality. Minimal diffuse cerebral and cerebellar atrophy. Mild chronic small vessel white matter ischemic changes in both cerebral hemispheres.   CTA H&N - Severe distal right M1 stenosis or subocclusive thrombus. 4 mm right supraclinoid ICA aneurysm. Widely patent cervical carotid and vertebral arteries.   CT Perfusion - Right MCA infarct with extensive penumbra.   CT head - Hypodensity in the right lateral temporal lobe now shows mild hemorrhage or contrast enhancement. This is most consistent with acute infarct. There has been interval stenting of the right middle cerebral artery.  CT Head - Right temporal lobe hypodensity compatible with acute infarct. Decreased hyperdensity within this stroke compared with 4 hours previously compatible with contrast staining rather than hemorrhage. Small area of acute infarct in the right parietal lobe now evident. Right MCA stent.  MRI head - Fairly extensive patchy acute/early subacute infarcts within the right MCA/watershed territory. Additional small acute/early subacute infarcts within the dorsal right thalamus and left basal ganglia.  MRA head -  Interval stenting of the M1/M2 right middle cerebral artery. Flow related signal is present proximal and distal to the stent suggestive of stent patency. 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  2D Echo - EF 70 - 75%. No cardiac source of emboli identified.   UE doppler neg DVT  Loyal Jacobson Virus 2 - negative  LDL - 85  HgbA1c - 5.8  VTE prophylaxis - SCDs  No antithrombotic prior to admission, now on aspirin 81 mg daily and Brilinta  (ticagrelor) 90 mg bid following bolus with cangrelor. Continue.    Therapy recommendations:  CIR  Disposition:  Pending  Acute hypoxic respiratory failure, due to pulmonary edema, requiring BiPAP  Recurrent intubation for agitation, respiratory distress, hypoxia  Off temporary bipap     Acute diastolic heart failure with pulmonary edema  Improved after lasix yesterday  temporary bipap at that time   Hypertension  Home BP meds: Amlodipine  Current BP meds: Lisinopril 10 mg daily   off Cleviprex  Stable  BP goal <180  Long-term BP goal normotensive  Hyperlipidemia  Home Lipid lowering medication: none  LDL 85, goal < 70  Current lipid lowering medication when has po access: added Lipitor 20 (not high intensity as LDL almost at goal)  Continue statin at discharge  Dysphagia  Secondary to stroke  MBSS cleared for D3 nectar thick liquids   Speech on board   Other Stroke Risk Factors  Advanced age  Other Active Problems  Pt speaks Madarin    Aortic Atherosclerosis (ICD10-I70.0).   4 mm right supraclinoid ICA aneurysm.  Hypokalemia - 3.0 - supplement - recheck 3.6     Olecranon bursitis, left    Hospital day # 4  Continue ongoing therapies.  Start some IV fluids for replacement due to rising creatinine and suspect dehydration.  Hopefully transfer to inpatient rehab over the next few days when bed available.  Greater than 50% time during this 25-minute visit was spent on counseling and coordination of care and discussion with care team.  No family available at the bedside for discussion Delia Heady, MD  to contact Stroke Continuity provider, please refer to [email or web address]. After hours, contact General Neurology

## 2020-10-09 NOTE — Progress Notes (Signed)
  Speech Language Pathology Treatment: Dysphagia  Patient Details Name: Mark May MRN: [id number] DOB: Sep 20, 1956 Today's Date: 10/09/2020 Time: 1230-1259 SLP Time Calculation (min) (ACUTE ONLY): 29 min  Assessment / Plan / Recommendation Clinical Impression  Pt was seen for dysphagia treatment with wife present and interpretation service utilized. Today's session focused on skilled observation and pt/caregiver education. Oral care was provided prior to offering any POs. Pt's spouse brought in food for him to eat, including a porridge and a mixed consistency soup. Spouse was educated on the pt's results of the previous MBS and his diet recommendations (dys 3/nectar). SLP also educated pt's spouse on how to thicken liquids, including soup. Pt demonstrated one instance of immediate coughing following a PO of mixed consistency soup. With min verbal cues, pt utilized effortful swallow during meal. SLP will continue to f/u acutely for diet toleration and advancement.   HPI HPI: [name] is a 64 y.o. male with history of hypertension, BPH, sciatica, stomach ulcer status post surgery in the past presented to ER at [facility] for acute onset left-sided weakness, left facial droop and left hemianopia. CT head and neck showed right M1 high-grade stenosis versus near occlusive thrombosis. CT perfusion showed large penumbra. Patient transferred to [facility] for thrombectomy. Pt intubated 11/5-11/7 with post-extubation respiratory distress.      SLP Plan  Continue with current plan of care       Recommendations  Diet recommendations: Dysphagia 3 (mechanical soft);Nectar-thick liquid Liquids provided via: Cup;Teaspoon;Straw Medication Administration: Via alternative means Supervision: Intermittent supervision to cue for compensatory strategies Compensations: Effortful swallow Postural Changes and/or Swallow Maneuvers: Seated upright 90 degrees                Oral Care  Recommendations: Oral care QID SLP Visit Diagnosis: Dysphagia, pharyngeal phase (R13.13) Plan: Continue with current plan of care       GO                Royetta Crochet 10/09/2020, 1:30 PM

## 2020-10-09 NOTE — Progress Notes (Signed)
Physical Therapy Treatment Patient Details Name: Mark May MRN: [id number] DOB: 1956/01/22 Today's Date: 10/09/2020    History of Present Illness  64 y.o. male admitted with L hemianopsia, L facial droop and L side weakness s/p R MCA revacularization on 11/5 with post procedure intubation    PT Comments    Treated pt in conjunction with OT and with virtual interpreter present this date to increase pt safety and participation in functional mobility. However, the pt was limited in his ability to participate secondary to bowel incontinence and symptomatic systolic orthostatic hypotension. He reported feeling dizzy upon sitting up and his BP dropped from 126/79 sitting EOB to 107/77 after standing. The nurse was notified of these findings. He required extensive assistance of modAx2 for supine <> sit transitions, maxA to roll to R, minA to roll to L, and maxAx2 to transfer STS with HHA and maintain static standing balance. Will continue to follow acutely and recommend CIR upon d/c to address his deficits in strength, balance, endurance, and mobility.   Follow Up Recommendations  CIR;Supervision/Assistance - 24 hour     Equipment Recommendations  3in1 (PT);Wheelchair (measurements PT);Wheelchair cushion (measurements PT);Hospital bed (may change as pt progresses)    Recommendations for Other Services Rehab consult     Precautions / Restrictions Precautions Precautions: Fall Precaution Comments: L sided hemipraesis; orthostatic hypotension Restrictions Weight Bearing Restrictions: No    Mobility  Bed Mobility Overal bed mobility: Needs Assistance Bed Mobility: Rolling;Supine to Sit;Sit to Supine Rolling: Max assist (roll L side minA, but maxA to roll to R)   Supine to sit: +2 for physical assistance;Mod assist Sit to supine: Mod assist;+2 for physical assistance   General bed mobility comments: Pt requires increased (A) to roll toward R side due to L hemiparesis . pt reaching with R  UE when rolling L and bridges buttocks even. pt lacks awareness to L side and needs (A) to protect L UE. Pt supine <> Sit requires (A) L LE  and static sitting due to L lean  Transfers Overall transfer level: Needs assistance Equipment used: 2 person hand held assist Transfers: Sit to/from Stand Sit to Stand: +2 physical assistance;Max assist         General transfer comment: pt requires L knee block and maxAx2 to maintain static standing with HHA. pt leaning toward L and needs facilitation of weight shift to the Right side. Trunk flexion increased as time progressed, resulting in VCs and TCs being provided to bring hips anterior and chest superior, with min success.  Ambulation/Gait             General Gait Details: unable to attempt today   Stairs             Wheelchair Mobility    Modified Rankin (Stroke Patients Only) Modified Rankin (Stroke Patients Only) Pre-Morbid Rankin Score: No symptoms Modified Rankin: Severe disability     Balance Overall balance assessment: Needs assistance Sitting-balance support: Single extremity supported;Feet supported Sitting balance-Leahy Scale: Poor Sitting balance - Comments: reliant on therapists   Standing balance support: Single extremity supported Standing balance-Leahy Scale: Zero Standing balance comment: maxA of 2 to maintain static stand                             Cognition Arousal/Alertness: Awake/alert Behavior During Therapy: Flat affect Overall Cognitive Status: Impaired/Different from baseline  General Comments: pt lacks awareness to incontinence. pt does show some awarness when educated on need for hygiene states "let my wife help you" wife was not present at this time to to help. Pt needs cues to scan to the L side of environment. pt needs use of visual compensatory strategies and R side to physically engage in L side of body      Exercises       General Comments General comments (skin integrity, edema, etc.): increase risk for skin break downa nd needs skin checks for hygiene hourly at this time. RN notified of needs. Rn requesting medication due to dirrahea; dizziness reported upon sitting up BP 126/79 sitting EOB, BP 107/77 after standing bout      Pertinent Vitals/Pain Pain Assessment: No/denies pain    Home Living                      Prior Function            PT Goals (current goals can now be found in the care plan section) Acute Rehab PT Goals Patient Stated Goal: none stated PT Goal Formulation: With patient Time For Goal Achievement: 11-01-20 Potential to Achieve Goals: Fair Progress towards PT goals: Progressing toward goals    Frequency    Min 4X/week      PT Plan Current plan remains appropriate    Co-evaluation PT/OT/SLP Co-Evaluation/Treatment: Yes Reason for Co-Treatment: Complexity of the patient's impairments (multi-system involvement);Necessary to address cognition/behavior during functional activity;For patient/therapist safety;To address functional/ADL transfers PT goals addressed during session: Mobility/safety with mobility;Balance;Proper use of DME        AM-PAC PT "6 Clicks" Mobility   Outcome Measure  Help needed turning from your back to your side while in a flat bed without using bedrails?: A Lot Help needed moving from lying on your back to sitting on the side of a flat bed without using bedrails?: A Lot Help needed moving to and from a bed to a chair (including a wheelchair)?: Total Help needed standing up from a chair using your arms (e.g., wheelchair or bedside chair)?: A Lot Help needed to walk in hospital room?: Total Help needed climbing 3-5 steps with a railing? : Total 6 Click Score: 9    End of Session Equipment Utilized During Treatment: Gait belt Activity Tolerance: Patient limited by fatigue;Treatment limited secondary to medical complications (Comment)  (systolic orthostatic hypotension; incontinence) Patient left: in bed;with call bell/phone within reach;with bed alarm set Nurse Communication: Mobility status;Other (comment) (BP changes and incontinence) PT Visit Diagnosis: Other abnormalities of gait and mobility (R26.89);Muscle weakness (generalized) (M62.81);Hemiplegia and hemiparesis;Unsteadiness on feet (R26.81);Difficulty in walking, not elsewhere classified (R26.2);Other symptoms and signs involving the nervous system (R29.898) Hemiplegia - Right/Left: Left Hemiplegia - dominant/non-dominant: Non-dominant Hemiplegia - caused by: Cerebral infarction     Time: 1517-6160 PT Time Calculation (min) (ACUTE ONLY): 32 min  Charges:  $Therapeutic Activity: 8-22 mins                     Raymond Gurney, PT, DPT Acute Rehabilitation Services  Pager: [phone] Office: [phone]    Jewel Baize 10/09/2020, 3:24 PM

## 2020-10-09 NOTE — Evaluation (Signed)
Occupational Therapy Evaluation Patient Details Name: Mark May MRN: [id number] DOB: Nov 30, 1956 Today's Date: 10/09/2020    History of Present Illness  65 y.o. 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 Pt only reports "dizzy" but unable to describe or give more information. Pt was unable to count to 10 during this time showing increased symptoms. Pt also noted to have diarrhea x2 during session. Pt remains supine due to incontinence at this time for easier skin checks and hygiene. Recommend for Cir at this time.     Follow Up Recommendations  CIR    Equipment Recommendations  Wheelchair (measurements OT);Wheelchair cushion (measurements OT);Hospital bed;3 in 1 bedside commode    Recommendations for Other Services Rehab consult     Precautions / Restrictions Precautions Precautions: Fall Precaution Comments: L sided hemipraesis      Mobility Bed Mobility Overal bed mobility: Needs Assistance Bed Mobility: Rolling;Supine to Sit;Sit to Supine Rolling: Max assist (roll L side Min (A))   Supine to sit: +2 for physical assistance;Mod assist Sit to supine: Mod assist;+2 for safety/equipment;+2 for physical assistance   General bed mobility comments: Pt requires increased (A) to roll toward R side due to L hemiparesis . pt reaching with R UE when rolling L and bridges buttocks even. pt lacks awareness to L side and needs (A) to protect L UE. Pt supine <> Sit requires (A) L LE  and static sitting due to L lean    Transfers Overall transfer level: Needs assistance Equipment used: 2 person hand held assist Transfers: Sit to/from Stand Sit to Stand: +2 physical assistance;Max assist         General transfer comment: pt requires total block LLE and (A) for upright posture. pt leaning toward L and  needs facilitation of weight shift to the Right side    Balance Overall balance assessment: Needs assistance Sitting-balance support: Single extremity supported;Feet supported Sitting balance-Leahy Scale: Poor Sitting balance - Comments: reliant on therapists   Standing balance support: Single extremity supported Standing balance-Leahy Scale: Zero                             ADL either performed or assessed with clinical judgement   ADL Overall ADL's : Needs assistance/impaired Eating/Feeding: Minimal assistance;Bed level Eating/Feeding Details (indicate cue type and reason): eating banana that was on L side of tray. pt requesting to eat instead of cook food on tray  Grooming: Oral care;Maximal assistance Grooming Details (indicate cue type and reason): pt noted to have dry mouth and lips--- provided oral care and lip moisturer this session             Lower Body Dressing: Total assistance   Toilet Transfer: +2 for physical assistance;Maximal assistance (L LE fully blocked) Toilet Transfer Details (indicate cue type and reason): simulated    Toileting - Clothing Manipulation Details (indicate cue type and reason): pt incontinence x2 during session and no awareness. RN made aware of signs of iriration due to voiding. pt with heavy barrier cream applied during session to help protect sign       General ADL Comments: Translator placed in R visual field with good communicaiton engagement from patient. Pt needed cues to scan to the L for objects  Vision   Vision Assessment?: Vision impaired- to be further tested in functional context Additional Comments: to be further assessed in next session. used functional task to help determine due to internal distraction to drink and incontinence at this time     Perception Perception Perception Tested?: Yes Perception Deficits: Inattention/neglect Inattention/Neglect: Does not attend to left visual field;Does not attend  to left side of body;Impaired- to be further tested in functional context   Praxis      Pertinent Vitals/Pain Pain Assessment: No/denies pain     Hand Dominance Right   Extremity/Trunk Assessment Upper Extremity Assessment Upper Extremity Assessment: LUE deficits/detail LUE Deficits / Details: flaccid LUE Sensation: decreased light touch LUE Coordination: decreased fine motor;decreased gross motor   Lower Extremity Assessment Lower Extremity Assessment: LLE deficits/detail LLE Deficits / Details: quad activation, needs strong visual attention and tactile input of R hand to engage on command   Cervical / Trunk Assessment Cervical / Trunk Assessment: Kyphotic   Communication Communication Communication: Other (comment) (using translator and nothing stated as a deficit at that tim)   Cognition Arousal/Alertness: Awake/alert Behavior During Therapy: Flat affect Overall Cognitive Status: Impaired/Different from baseline                                 General Comments: pt lacks awareness to incontinence. pt does show some awarness when educated on need for hygiene states "let my wife help you" wife was not present at this time to to help. Pt needs cues to scan to the L side of environment. pt needs use of visual compensatory strategies and R side to physically engage in L side of body   General Comments  increase risk for skin break downa nd needs skin checks for hygiene hourly at this time. RN notified of needs. Rn requesting medication due to dirrahea    Exercises     Shoulder Instructions      Home Living Family/patient expects to be discharged to:: Private residence Living Arrangements: Spouse/significant other;Children Available Help at Discharge: Family;Available 24 hours/day Type of Home: House Home Access: Stairs to enter [employer] of Steps: 7-8 Entrance Stairs-Rails: Can reach both Home Layout: One level                    Additional Comments: information via PT evaluation. pt mentiond wife during OT eval. 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 [employer]        OT Problem List: Decreased strength;Decreased activity tolerance;Decreased range of motion;Impaired balance (sitting and/or standing);Impaired vision/perception;Decreased coordination;Decreased cognition;Decreased safety awareness;Decreased knowledge of use of DME or AE;Decreased knowledge of precautions;Impaired UE functional use;Pain      OT Treatment/Interventions: Self-care/ADL training;Therapeutic exercise;Neuromuscular education;Energy conservation;DME and/or AE instruction;Manual therapy;Modalities;Therapeutic activities;Cognitive remediation/compensation;Visual/perceptual remediation/compensation;Patient/family education;Balance training    OT Goals(Current goals can be found in the care plan section) Acute Rehab OT Goals Patient Stated Goal: none stated OT Goal Formulation: Patient unable to participate in goal setting Time For Goal Achievement: 10/23/20 Potential to Achieve Goals: Good  OT Frequency: Min 2X/week   Barriers to D/C:            Co-evaluation PT/OT/SLP Co-Evaluation/Treatment: Yes Reason for Co-Treatment: Complexity of the patient's impairments (multi-system involvement);Necessary to address cognition/behavior during functional activity;For patient/therapist  safety;To address functional/ADL transfers   OT goals addressed during session: ADL's and self-care;Proper use of Adaptive equipment and DME      AM-PAC OT "6 Clicks" Daily Activity     Outcome Measure Help from another person eating meals?: A Little Help from another person taking care of personal grooming?: A Little Help from another person toileting, which includes using toliet, bedpan, or urinal?: A  Lot Help from another person bathing (including washing, rinsing, drying)?: A Lot Help from another person to put on and taking off regular upper body clothing?: A Lot Help from another person to put on and taking off regular lower body clothing?: A Lot 6 Click Score: 14   End of Session Equipment Utilized During Treatment: Gait belt Nurse Communication: Mobility status;Precautions  Activity Tolerance: Patient tolerated treatment well Patient left: in bed;with call bell/phone within reach;with bed alarm set  OT Visit Diagnosis: Unsteadiness on feet (R26.81);Muscle weakness (generalized) (M62.81)                Time: 0100-7121 OT Time Calculation (min): 36 min Charges:  OT General Charges $OT Visit: 1 Visit OT Evaluation $OT Eval Moderate Complexity: 1 Mod   Brynn, OTR/L  Acute Rehabilitation Services Pager: [phone] Office: [phone] .'  Mateo Flow 10/09/2020, 11:35 AM

## 2020-10-09 NOTE — Progress Notes (Signed)
Pt w/ c/o chest discomfort which started about 5 minutes ago. Dr. Derry Lory informed.

## 2020-10-10 ENCOUNTER — Inpatient Hospital Stay (HOSPITAL_COMMUNITY): Payer: 59

## 2020-10-10 DIAGNOSIS — I6601 Occlusion and stenosis of right middle cerebral artery: Secondary | ICD-10-CM | POA: Diagnosis not present

## 2020-10-10 LAB — BASIC METABOLIC PANEL
Anion gap: 9 (ref 5–15)
BUN: 36 mg/dL — ABNORMAL HIGH (ref 8–23)
CO2: 23 mmol/L (ref 22–32)
Calcium: 8.1 mg/dL — ABNORMAL LOW (ref 8.9–10.3)
Chloride: 105 mmol/L (ref 98–111)
Creatinine, Ser: 1.69 mg/dL — ABNORMAL HIGH (ref 0.61–1.24)
GFR, Estimated: 44 mL/min — ABNORMAL LOW (ref >60)
Glucose, Bld: 165 mg/dL — ABNORMAL HIGH (ref 70–99)
Potassium: 3.4 mmol/L — ABNORMAL LOW (ref 3.5–5.1)
Sodium: 137 mmol/L (ref 135–145)

## 2020-10-10 LAB — URINALYSIS, ROUTINE W REFLEX MICROSCOPIC
Bilirubin Urine: NEGATIVE
Glucose, UA: NEGATIVE mg/dL
Ketones, ur: NEGATIVE mg/dL
Leukocytes,Ua: NEGATIVE
Nitrite: NEGATIVE
Protein, ur: 100 mg/dL — AB
Specific Gravity, Urine: 1.019 (ref 1.005–1.030)
pH: 5 (ref 5.0–8.0)

## 2020-10-10 LAB — CBC
HCT: 41 % (ref 39.0–52.0)
Hemoglobin: 13.8 g/dL (ref 13.0–17.0)
MCH: 29.9 pg (ref 26.0–34.0)
MCHC: 33.7 g/dL (ref 30.0–36.0)
MCV: 88.7 fL (ref 80.0–100.0)
Platelets: 203 10*3/uL (ref 150–400)
RBC: 4.62 MIL/uL (ref 4.22–5.81)
RDW: 13.7 % (ref 11.5–15.5)
WBC: 5.1 10*3/uL (ref 4.0–10.5)
nRBC: 0 % (ref 0.0–0.2)

## 2020-10-10 LAB — GLUCOSE, CAPILLARY: Glucose-Capillary: 154 mg/dL — ABNORMAL HIGH (ref 70–99)

## 2020-10-10 MED ORDER — MELATONIN 3 MG PO TABS
3.0000 mg | ORAL_TABLET | Freq: Every day | ORAL | Status: DC
  Administered 2020-10-10: 3 mg via ORAL
  Filled 2020-10-10 (×2): qty 1

## 2020-10-10 MED ORDER — POTASSIUM CHLORIDE 20 MEQ PO PACK
40.0000 meq | PACK | Freq: Two times a day (BID) | ORAL | Status: AC
  Administered 2020-10-10 (×2): 40 meq via ORAL
  Filled 2020-10-10 (×2): qty 2

## 2020-10-10 NOTE — Progress Notes (Signed)
Physical Therapy Treatment Patient Details Name: Mark May MRN: [id number] DOB: 10-Feb-1956 Today's Date: 10/10/2020    History of Present Illness  64 y.o. male admitted with L hemianopsia, L facial droop and L side weakness s/p R MCA revacularization on 11/5 with post procedure intubation    PT Comments    Pt is very lethargic this date and minimally participating. He continues to report dizziness but there were no significant changes in BP with changes in position this date. Pt required maxAx2 to perform all bed mobility with no initiation noted in the L LE. Pt was able to tolerate transferring towards the R to the chair this date though with maxAx2 with help from rehab tech while pt was being provided L knee block. Will continue to follow acutely and recommend CIR upon d/c to address his deficits in strength, coordination, balance, and mobility.  Follow Up Recommendations  CIR;Supervision/Assistance - 24 hour     Equipment Recommendations  3in1 (PT);Wheelchair (measurements PT);Wheelchair cushion (measurements PT);Hospital bed (may change as pt progresses)    Recommendations for Other Services Rehab consult     Precautions / Restrictions Precautions Precautions: Fall Precaution Comments: L side hemiparesis; monitor BP Restrictions Weight Bearing Restrictions: No    Mobility  Bed Mobility Overal bed mobility: Needs Assistance Bed Mobility: Supine to Sit     Supine to sit: +2 for physical assistance;Max assist     General bed mobility comments: Cues provided to manage LEs off EOB, with decreased initiation noted with B LEs with min movement on R and no activation noted on L. MaxAx2 to manage trunk and LEs.  Transfers Overall transfer level: Needs assistance Equipment used: 2 person hand held assist Transfers: Stand Pivot Transfers   Stand pivot transfers: +2 physical assistance;Max assist       General transfer comment: L knee block and cues to come to stand and  pivot towards R EOB > bedside chair, maxAx2 with min initiation noted to maintain balance.   Ambulation/Gait             General Gait Details: unable to attempt today   Stairs             Wheelchair Mobility    Modified Rankin (Stroke Patients Only) Modified Rankin (Stroke Patients Only) Pre-Morbid Rankin Score: No symptoms Modified Rankin: Severe disability     Balance Overall balance assessment: Needs assistance Sitting-balance support: Bilateral upper extremity supported;Feet supported Sitting balance-Leahy Scale: Zero Sitting balance - Comments: MaxA at trunk to maintain static sitting balance EOB.   Standing balance support: Single extremity supported Standing balance-Leahy Scale: Zero Standing balance comment: maxA of 2 to maintain static stand with L knee block                             Cognition Arousal/Alertness: Lethargic Behavior During Therapy: Flat affect Overall Cognitive Status: Impaired/Different from baseline Area of Impairment: Attention;Memory;Following commands;Safety/judgement;Awareness;Problem solving                   Current Attention Level: Alternating Memory: Decreased recall of precautions;Decreased short-term memory Following Commands: Follows one step commands inconsistently;Follows one step commands with increased time Safety/Judgement: Decreased awareness of safety;Decreased awareness of deficits Awareness: Intellectual Problem Solving: Slow processing;Decreased initiation;Difficulty sequencing;Requires verbal cues;Requires tactile cues General Comments: Pt minimally responsive this date and lethargic. Increased time and max cues provided to facilitate mobility, with decreased initiation noted.      Exercises  General Comments General comments (skin integrity, edema, etc.): BP readings of the following this date with reports of dizziness throughout: 120/78 upon arrival supine in bed, 110/73 once seated  EOB, 123/79 after several min sitting EOB, 139/82 after transfer, 130/82 after several min resting in bedside chair following transfer      Pertinent Vitals/Pain Pain Assessment: No/denies pain Pain Intervention(s): Limited activity within patient's tolerance;Monitored during session;Repositioned (dizziness, requesting meds, notified nurse)    Home Living                      Prior Function            PT Goals (current goals can now be found in the care plan section) Acute Rehab PT Goals Patient Stated Goal: to decrease dizziness PT Goal Formulation: With patient Time For Goal Achievement: 10/24/2020 Potential to Achieve Goals: Fair Progress towards PT goals: Progressing toward goals    Frequency    Min 4X/week      PT Plan Current plan remains appropriate    Co-evaluation              AM-PAC PT "6 Clicks" Mobility   Outcome Measure  Help needed turning from your back to your side while in a flat bed without using bedrails?: A Lot Help needed moving from lying on your back to sitting on the side of a flat bed without using bedrails?: A Lot Help needed moving to and from a bed to a chair (including a wheelchair)?: A Lot Help needed standing up from a chair using your arms (e.g., wheelchair or bedside chair)?: A Lot Help needed to walk in hospital room?: Total Help needed climbing 3-5 steps with a railing? : Total 6 Click Score: 10    End of Session Equipment Utilized During Treatment: Gait belt Activity Tolerance: Patient limited by lethargy Patient left: in chair;with call bell/phone within reach;with chair alarm set Nurse Communication: Mobility status;Other (comment) (reports of dizziness and BP throughout session) PT Visit Diagnosis: Other abnormalities of gait and mobility (R26.89);Muscle weakness (generalized) (M62.81);Hemiplegia and hemiparesis;Unsteadiness on feet (R26.81);Difficulty in walking, not elsewhere classified (R26.2);Other symptoms and  signs involving the nervous system (R29.898) Hemiplegia - Right/Left: Left Hemiplegia - dominant/non-dominant: Non-dominant Hemiplegia - caused by: Cerebral infarction     Time: 0925-0950 PT Time Calculation (min) (ACUTE ONLY): 25 min  Charges:  $Therapeutic Activity: 23-37 mins                     Raymond Gurney, PT, DPT Acute Rehabilitation Services  Pager: [phone] Office: [phone]    Henrene Dodge Pettis 10/10/2020, 10:07 AM

## 2020-10-10 NOTE — Progress Notes (Signed)
RT note. RT called to pt. Bedside for increase WOB. Pt. Placed on 2LNC sat 95%. VS stable at this time, RT will continue to monitor.

## 2020-10-10 NOTE — Plan of Care (Signed)

## 2020-10-10 NOTE — Progress Notes (Signed)
Brief Neuro Update:  Patient reported chest pain. EKG with NSR, troponins negative, CXR negative. Patient's chest pain later resolved spontaneously after a burp.

## 2020-10-10 NOTE — Progress Notes (Signed)
Inpatient Rehabilitation-Admissions Coordinator   Met with pt bedside as follow up from PM&R MD consult. Please see consult note from Dr. Naaman Plummer on 11/9 for details. Discussed reason for visit (using [employer] video interpreter) and recommendation for CIR. Pt indicates interest and gave me permission to speak to his son for details. Spoke with son via phone and he confirmed DC support. Will begin insurance authorization process for possible admit.   Raechel Ache, OTR/L  Rehab Admissions Coordinator  [phone] 10/10/2020 10:53 AM

## 2020-10-10 NOTE — Progress Notes (Signed)
Brief Neuro Update:  Notified by RN of high MEWs score and patient more lethargic. He has had tachycardia for most of the day. On my evaluation, he did not wake up to voice initially or with movement but woke up to nares stimlation and to squirting some saline in his eyes and was able to follow commands. Has abdomen that is slightly distended with some tenderness to palpation.  - RN to keep a close eye on him - I ordered an abdominal Xray.   Erick Blinks [facility] Pager Number [phone]

## 2020-10-10 NOTE — Progress Notes (Signed)
Per Dr. Derry Lory, on-coming RN to contact covering neurologist w/ EKG results. This communicated.

## 2020-10-10 NOTE — Progress Notes (Signed)
STROKE TEAM PROGRESS NOTE   INTERVAL HISTORY Patient is sitting up in bed and looks comfortable.  He remains dysarthric with left hemiparesis.  Neurological exam is unchanged.  Vital signs are stable.  CBC is unremarkable.  BMP continues to show slight increase in creatinine.  And potassium is slightly low at 3.4  OBJECTIVE Vitals:   10/10/20 0652 10/10/20 0838 10/10/20 1122 10/10/20 1222  BP: 118/62 125/77 102/62 103/67  Pulse: (!) 107 (!) 110 100 (!) 108  Resp: 18 18 18 16   Temp: 97.7 F (36.5 C) 99 F (37.2 C) 98.4 F (36.9 C) 98.7 F (37.1 C)  TempSrc: Oral Oral Oral   SpO2: 97% 94% 96% 98%   CBC:  Recent Labs  Lab 10/04/2020 1246 10/20/2020 2024 10/06/20 0522 10/07/20 0527 10/09/20 0329 10/10/20 0259  WBC 7.1  --  10.7*   < > 6.5 5.1  NEUTROABS 5.2  --  8.2*  --   --   --   HGB 16.5   < > 13.5   < > 14.6 13.8  HCT 49.1   < > 40.4   < > 43.2 41.0  MCV 89.4  --  89.2   < > 89.1 88.7  PLT 291  --  232   < > 199 203   < > = values in this interval not displayed.   Basic Metabolic Panel:  Recent Labs  Lab 10/08/20 0727 10/08/20 0727 10/09/20 0329 10/10/20 0259  NA 139   < > 138 137  K 3.6   < > 3.6 3.4*  CL 101   < > 103 105  CO2 26   < > 24 23  GLUCOSE 130*   < > 151* 165*  BUN 18   < > 27* 36*  CREATININE 1.39*   < > 1.48* 1.69*  CALCIUM 7.8*   < > 8.2* 8.1*  MG 1.8  --   --   --   PHOS 4.9*  --   --   --    < > = values in this interval not displayed.   Lipid Panel:     Component Value Date/Time   CHOL 173 10/06/2020 0522   CHOL 216 (H) 03/09/2018 1620   TRIG 396 (H) 10/07/2020 0527   HDL 33 (L) 10/06/2020 0522   HDL 59 03/09/2018 1620   CHOLHDL 5.2 10/06/2020 0522   VLDL 55 (H) 10/06/2020 0522   LDLCALC 85 10/06/2020 0522   LDLCALC 136 (H) 03/09/2018 1620   HgbA1c:  Lab Results  Component Value Date   HGBA1C 5.8 (H) 10/06/2020   Urine Drug Screen: No results found for: LABOPIA, COCAINSCRNUR, LABBENZ, AMPHETMU, THCU, LABBARB  Alcohol Level No  results found for: [facility]  IMAGING  CT HEAD WO CONTRAST 10/25/2020 1. No acute abnormality.  2. Minimal diffuse cerebral and cerebellar atrophy.  3. Mild chronic small vessel white matter ischemic changes in both cerebral hemispheres.   CT Angio Head W or Wo Contrast CT Angio Neck W and/or Wo Contrast CT CEREBRAL PERFUSION W CONTRAST 10/15/2020 1. 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  2. Right MCA infarct with extensive penumbra.  3. 4 mm right supraclinoid ICA aneurysm.  4. Widely patent cervical carotid and vertebral arteries.  5. Aortic Atherosclerosis (ICD10-I70.0).   CT HEAD WO CONTRAST 10/18/2020 Hypodensity in the right lateral temporal lobe now shows mild hemorrhage or contrast enhancement. This is most consistent with acute infarct. There has been interval stenting of the right middle  cerebral artery.   CT HEAD WO CONTRAST 10/01/2020 Right temporal lobe hypodensity compatible with acute infarct. Decreased hyperdensity within this stroke compared with 4 hours previously compatible with contrast staining rather than hemorrhage. Small area of acute infarct in the right parietal lobe now evident. Right MCA stent.   MRI brain: 10/06/20 1. Fairly extensive patchy acute/early subacute infarcts within the right MCA/watershed territory.  2. Additional small acute/early subacute infarcts within the dorsal right thalamus and left basal ganglia. 3. Background moderate chronic small vessel ischemic disease.  MRA head: 10/06/20 1. Interval stenting of the M1/M2 right middle cerebral artery. Flow related signal is present proximal and distal to the stent suggestive of stent patency. 2. 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  DG Abd 1 View 10/07/2020 Gastric catheter within the stomach kinked at the proximal side port. This could be withdrawn slightly S necessary.   Transthoracic Echocardiogram  10/06/2020 1. Left ventricular  ejection fraction, by estimation, is 70 to 75%. The left ventricle has hyperdynamic function. The left ventricle has no regional wall motion abnormalities. Left ventricular diastolic parameters are consistent with Grade II diastolic dysfunction (pseudonormalization). Elevated left atrial pressure.  2. Right ventricular systolic function is normal. The right ventricular size is normal.  3. The mitral valve is normal in structure. Mild mitral valve regurgitation. No evidence of mitral stenosis.  4. The aortic valve is normal in structure. Aortic valve regurgitation is not visualized. No aortic stenosis is present.  5. The inferior vena cava is dilated in size with <50% respiratory variability, suggesting right atrial pressure of 15 mmHg.  ECG - SR rate 70 BPM. (See cardiology reading for complete details)   PHYSICAL EXAM     Blood pressure 103/67, pulse (!) 108, temperature 98.7 F (37.1 C), resp. rate 16, SpO2 98 %.  Frail elderly [place] male. Afebrile. Head is nontraumatic. Neck is supple without bruit.    Cardiac exam no murmur or gallop. Lungs are clear to auscultation. Distal pulses are well felt. Neurological Exam : He is awake and appears and follows few simple midline commands.Marland Kitchen PERRL, grimaces to stim, opens eyes spontaneously, conjugate midline gaze right preference,blinks on the right not on the left ,left lower facial weakness.  Purposeful and spontaneous movements on the right. , no movement left arm.  Does withdraw left legs to pain.  Gait deferred.     ASSESSMENT/PLAN [name] is a 64 y.o. male with history of hypertension, BPH, sciatica, stomach ulcer status post surgery in the past presented to ER at [facility] for acute onset left-sided weakness, left facial droop and left hemianopia.  CT head and neck showed right M1 high-grade stenosis versus near occlusive thrombosis.  CT perfusion showed large penumbra.  Patient transferred to [facility] for  thrombectomy.  He did not receive IV t-PA due to late presentation (>4.5 hours from time of onset). IR - complete occlusion of RT MCA M 1 seg - thrombectomy ->TICI 3 revascularization with placement of rescue stent m4 mm x 24 mm Atlas for reocclusion with restoration of TICI 3 .  Stroke: Rt lateral temporal infarct s/p IR TICI3 R M1 w/ stent placement - thromboembolic d/t large vessel disease  CT Head - No acute abnormality. Minimal diffuse cerebral and cerebellar atrophy. Mild chronic small vessel white matter ischemic changes in both cerebral hemispheres.   CTA H&N - Severe distal right M1 stenosis or subocclusive thrombus. 4 mm right supraclinoid ICA aneurysm. Widely  patent cervical carotid and vertebral arteries.   CT Perfusion - Right MCA infarct with extensive penumbra.   CT head - Hypodensity in the right lateral temporal lobe now shows mild hemorrhage or contrast enhancement. This is most consistent with acute infarct. There has been interval stenting of the right middle cerebral artery.   CT Head - Right temporal lobe hypodensity compatible with acute infarct. Decreased hyperdensity within this stroke compared with 4 hours previously compatible with contrast staining rather than hemorrhage. Small area of acute infarct in the right parietal lobe now evident. Right MCA stent.  MRI head - Fairly extensive patchy acute/early subacute infarcts within the right MCA/watershed territory. Additional small acute/early subacute infarcts within the dorsal right thalamus and left basal ganglia.  MRA head -  Interval stenting of the M1/M2 right middle cerebral artery. Flow related signal is present proximal and distal to the stent suggestive of stent patency. 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  2D Echo - EF 70 - 75%. No cardiac source of emboli identified.   UE doppler neg DVT  Loyal Jacobson Virus 2 - negative  LDL - 85  HgbA1c - 5.8  VTE prophylaxis  - SCDs  No antithrombotic prior to admission, now on aspirin 81 mg daily and Brilinta (ticagrelor) 90 mg bid following bolus with cangrelor. Continue.    Therapy recommendations:  CIR  Disposition:  Pending  Acute hypoxic respiratory failure, due to pulmonary edema, requiring BiPAP  Recurrent intubation for agitation, respiratory distress, hypoxia  Off temporary bipap     Acute diastolic heart failure with pulmonary edema  Improved after lasix yesterday  temporary bipap at that time   Hypertension  Home BP meds: Amlodipine  Current BP meds: Lisinopril 10 mg daily   off Cleviprex  Stable . BP goal <180 . Long-term BP goal normotensive  Hyperlipidemia  Home Lipid lowering medication: none  LDL 85, goal < 70  Current lipid lowering medication when has po access: added Lipitor 20 (not high intensity as LDL almost at goal)  Continue statin at discharge  Dysphagia . Secondary to stroke . MBSS cleared for D3 nectar thick liquids  . Speech on board   Other Stroke Risk Factors  Advanced age  Other Active Problems  Pt speaks Madarin    Aortic Atherosclerosis (ICD10-I70.0).   4 mm right supraclinoid ICA aneurysm.  Hypokalemia - 3.0 - supplement - recheck 3.6 -3.4    Olecranon bursitis, left    Hospital day # 5  Continue ongoing therapies.  Plan increase IV fluids 200 cc/h for replacement due to rising creatinine and suspect dehydration.  Replace potassium.  Hopefully transfer to inpatient rehab over the next few days when bed available.  Greater than 50% time during this 25-minute visit was spent on counseling and coordination of care and discussion with care team.  No family available at the bedside for discussion Delia Heady, MD  to contact Stroke Continuity provider, please refer to [email or web address]. After hours, contact General Neurology

## 2020-10-10 NOTE — Progress Notes (Signed)
Per telemetry at 0648, pt w/ ST elevation 2.6-2.9, vs wnl, pt asymptomatic. Dr. Derry Lory informed

## 2020-10-11 ENCOUNTER — Inpatient Hospital Stay (HOSPITAL_COMMUNITY): Payer: 59

## 2020-10-11 DIAGNOSIS — I10 Essential (primary) hypertension: Secondary | ICD-10-CM | POA: Diagnosis not present

## 2020-10-11 DIAGNOSIS — I6601 Occlusion and stenosis of right middle cerebral artery: Secondary | ICD-10-CM | POA: Diagnosis not present

## 2020-10-11 DIAGNOSIS — A419 Sepsis, unspecified organism: Secondary | ICD-10-CM | POA: Diagnosis not present

## 2020-10-11 DIAGNOSIS — R6520 Severe sepsis without septic shock: Secondary | ICD-10-CM | POA: Diagnosis not present

## 2020-10-11 DIAGNOSIS — N179 Acute kidney failure, unspecified: Secondary | ICD-10-CM | POA: Diagnosis present

## 2020-10-11 DIAGNOSIS — I4891 Unspecified atrial fibrillation: Secondary | ICD-10-CM | POA: Diagnosis not present

## 2020-10-11 LAB — URINE CULTURE: Culture: NO GROWTH

## 2020-10-11 LAB — BASIC METABOLIC PANEL
Anion gap: 9 (ref 5–15)
BUN: 47 mg/dL — ABNORMAL HIGH (ref 8–23)
CO2: 19 mmol/L — ABNORMAL LOW (ref 22–32)
Calcium: 7.7 mg/dL — ABNORMAL LOW (ref 8.9–10.3)
Chloride: 111 mmol/L (ref 98–111)
Creatinine, Ser: 1.96 mg/dL — ABNORMAL HIGH (ref 0.61–1.24)
GFR, Estimated: 37 mL/min — ABNORMAL LOW (ref >60)
Glucose, Bld: 248 mg/dL — ABNORMAL HIGH (ref 70–99)
Potassium: 4.1 mmol/L (ref 3.5–5.1)
Sodium: 139 mmol/L (ref 135–145)

## 2020-10-11 LAB — APTT: aPTT: 41 seconds — ABNORMAL HIGH (ref 24–36)

## 2020-10-11 LAB — TROPONIN I (HIGH SENSITIVITY)
Troponin I (High Sensitivity): 43 ng/L — ABNORMAL HIGH (ref <18)
Troponin I (High Sensitivity): 35 ng/L — ABNORMAL HIGH (ref <18)

## 2020-10-11 LAB — COMPREHENSIVE METABOLIC PANEL
ALT: 45 U/L — ABNORMAL HIGH (ref 0–44)
AST: 101 U/L — ABNORMAL HIGH (ref 15–41)
Albumin: 1.6 g/dL — ABNORMAL LOW (ref 3.5–5.0)
Alkaline Phosphatase: 96 U/L (ref 38–126)
Anion gap: 12 (ref 5–15)
BUN: 47 mg/dL — ABNORMAL HIGH (ref 8–23)
CO2: 18 mmol/L — ABNORMAL LOW (ref 22–32)
Calcium: 8.1 mg/dL — ABNORMAL LOW (ref 8.9–10.3)
Chloride: 110 mmol/L (ref 98–111)
Creatinine, Ser: 1.86 mg/dL — ABNORMAL HIGH (ref 0.61–1.24)
GFR, Estimated: 40 mL/min — ABNORMAL LOW (ref >60)
Glucose, Bld: 155 mg/dL — ABNORMAL HIGH (ref 70–99)
Potassium: 3.6 mmol/L (ref 3.5–5.1)
Sodium: 140 mmol/L (ref 135–145)
Total Bilirubin: 1.3 mg/dL — ABNORMAL HIGH (ref 0.3–1.2)
Total Protein: 5.3 g/dL — ABNORMAL LOW (ref 6.5–8.1)

## 2020-10-11 LAB — GLUCOSE, CAPILLARY
Glucose-Capillary: 121 mg/dL — ABNORMAL HIGH (ref 70–99)
Glucose-Capillary: 123 mg/dL — ABNORMAL HIGH (ref 70–99)

## 2020-10-11 LAB — LACTIC ACID, PLASMA: Lactic Acid, Venous: 3 mmol/L (ref 0.5–1.9)

## 2020-10-11 LAB — PROCALCITONIN: Procalcitonin: 53.48 ng/mL

## 2020-10-11 LAB — PROTIME-INR
INR: 1.1 (ref 0.8–1.2)
Prothrombin Time: 13.7 seconds (ref 11.4–15.2)

## 2020-10-11 LAB — PHOSPHORUS: Phosphorus: 1.6 mg/dL — ABNORMAL LOW (ref 2.5–4.6)

## 2020-10-11 LAB — D-DIMER, QUANTITATIVE: D-Dimer, Quant: 9.78 ug/mL-FEU — ABNORMAL HIGH (ref 0.00–0.50)

## 2020-10-11 LAB — MAGNESIUM: Magnesium: 2.3 mg/dL (ref 1.7–2.4)

## 2020-10-11 MED ORDER — FAMOTIDINE IN NACL 20-0.9 MG/50ML-% IV SOLN
20.0000 mg | Freq: Every day | INTRAVENOUS | Status: DC
  Administered 2020-10-12 – 2020-10-14 (×4): 20 mg via INTRAVENOUS
  Filled 2020-10-11 (×5): qty 50

## 2020-10-11 MED ORDER — BISACODYL 10 MG RE SUPP
10.0000 mg | Freq: Every day | RECTAL | Status: DC | PRN
  Administered 2020-10-11 – 2020-10-12 (×2): 10 mg via RECTAL
  Filled 2020-10-11 (×2): qty 1

## 2020-10-11 MED ORDER — PIPERACILLIN-TAZOBACTAM 3.375 G IVPB 30 MIN
3.3750 g | Freq: Once | INTRAVENOUS | Status: AC
  Administered 2020-10-11: 3.375 g via INTRAVENOUS
  Filled 2020-10-11 (×2): qty 50

## 2020-10-11 MED ORDER — AMIODARONE HCL IN DEXTROSE 360-4.14 MG/200ML-% IV SOLN
30.0000 mg/h | INTRAVENOUS | Status: DC
  Administered 2020-10-11 – 2020-10-13 (×4): 30 mg/h via INTRAVENOUS
  Filled 2020-10-11 (×5): qty 200

## 2020-10-11 MED ORDER — AMIODARONE LOAD VIA INFUSION
150.0000 mg | Freq: Once | INTRAVENOUS | Status: AC
  Administered 2020-10-11: 150 mg via INTRAVENOUS
  Filled 2020-10-11: qty 83.34

## 2020-10-11 MED ORDER — CALCIUM GLUCONATE-NACL 1-0.675 GM/50ML-% IV SOLN
1.0000 g | Freq: Once | INTRAVENOUS | Status: AC
  Administered 2020-10-11: 1000 mg via INTRAVENOUS
  Filled 2020-10-11: qty 50

## 2020-10-11 MED ORDER — VANCOMYCIN HCL IN DEXTROSE 1-5 GM/200ML-% IV SOLN: 1000.0000 mg | INTRAVENOUS | Status: DC

## 2020-10-11 MED ORDER — INSULIN ASPART 100 UNIT/ML ~~LOC~~ SOLN
0.0000 [IU] | SUBCUTANEOUS | Status: DC
  Administered 2020-10-12: 2 [IU] via SUBCUTANEOUS
  Administered 2020-10-13: 1 [IU] via SUBCUTANEOUS
  Administered 2020-10-13 (×3): 2 [IU] via SUBCUTANEOUS
  Administered 2020-10-14 – 2020-10-15 (×3): 1 [IU] via SUBCUTANEOUS
  Administered 2020-10-15: 2 [IU] via SUBCUTANEOUS
  Administered 2020-10-15 (×3): 1 [IU] via SUBCUTANEOUS
  Administered 2020-10-16 (×2): 2 [IU] via SUBCUTANEOUS
  Administered 2020-10-16 – 2020-10-17 (×7): 1 [IU] via SUBCUTANEOUS
  Administered 2020-10-17: 2 [IU] via SUBCUTANEOUS
  Administered 2020-10-18: 3 [IU] via SUBCUTANEOUS
  Administered 2020-10-18 (×4): 2 [IU] via SUBCUTANEOUS
  Administered 2020-10-18: 3 [IU] via SUBCUTANEOUS
  Administered 2020-10-19 (×2): 1 [IU] via SUBCUTANEOUS

## 2020-10-11 MED ORDER — LACTATED RINGERS IV BOLUS
500.0000 mL | Freq: Once | INTRAVENOUS | Status: AC
  Administered 2020-10-11: 500 mL via INTRAVENOUS

## 2020-10-11 MED ORDER — LACTATED RINGERS IV SOLN: INTRAVENOUS | Status: DC

## 2020-10-11 MED ORDER — METRONIDAZOLE 500 MG PO TABS: 500.0000 mg | ORAL_TABLET | Freq: Three times a day (TID) | ORAL | Status: DC

## 2020-10-11 MED ORDER — METRONIDAZOLE IN NACL 5-0.79 MG/ML-% IV SOLN
500.0000 mg | INTRAVENOUS | Status: DC
  Administered 2020-10-11: 500 mg via INTRAVENOUS
  Filled 2020-10-11: qty 100

## 2020-10-11 MED ORDER — LACTATED RINGERS IV BOLUS: 1000.0000 mL | Freq: Once | INTRAVENOUS | Status: DC

## 2020-10-11 MED ORDER — VANCOMYCIN HCL 750 MG/150ML IV SOLN: 750.0000 mg | INTRAVENOUS | Status: DC

## 2020-10-11 MED ORDER — DILTIAZEM HCL-DEXTROSE 125-5 MG/125ML-% IV SOLN (PREMIX)
5.0000 mg/h | INTRAVENOUS | Status: DC
  Filled 2020-10-11: qty 125

## 2020-10-11 MED ORDER — HEPARIN (PORCINE) 25000 UT/250ML-% IV SOLN
800.0000 [IU]/h | INTRAVENOUS | Status: DC
  Administered 2020-10-11 – 2020-10-13 (×3): 800 [IU]/h via INTRAVENOUS
  Filled 2020-10-11 (×2): qty 250

## 2020-10-11 MED ORDER — SODIUM CHLORIDE 0.9 % IV SOLN: 2.0000 g | INTRAVENOUS | Status: DC

## 2020-10-11 MED ORDER — METOPROLOL TARTRATE 25 MG PO TABS
25.0000 mg | ORAL_TABLET | Freq: Two times a day (BID) | ORAL | Status: DC
  Administered 2020-10-11: 25 mg via ORAL
  Filled 2020-10-11: qty 1

## 2020-10-11 MED ORDER — DILTIAZEM LOAD VIA INFUSION
15.0000 mg | Freq: Once | INTRAVENOUS | Status: DC
  Filled 2020-10-11: qty 15

## 2020-10-11 MED ORDER — AMIODARONE HCL IN DEXTROSE 360-4.14 MG/200ML-% IV SOLN
60.0000 mg/h | INTRAVENOUS | Status: AC
  Administered 2020-10-11: 60 mg/h via INTRAVENOUS
  Filled 2020-10-11: qty 200

## 2020-10-11 MED ORDER — PIPERACILLIN-TAZOBACTAM 3.375 G IVPB
3.3750 g | Freq: Three times a day (TID) | INTRAVENOUS | Status: DC
  Administered 2020-10-12 – 2020-10-17 (×17): 3.375 g via INTRAVENOUS
  Filled 2020-10-11 (×18): qty 50

## 2020-10-11 MED ORDER — LACTATED RINGERS IV BOLUS
30.0000 mL/kg | Freq: Once | INTRAVENOUS | Status: AC
  Administered 2020-10-11: 1701 mL via INTRAVENOUS

## 2020-10-11 NOTE — Progress Notes (Addendum)
eLink Physician-Brief Progress Note Patient Name: Mark May DOB: 1955/12/25 MRN: [id number]   Date of Service  10/11/2020  HPI/Events of Note  Troponin 43  -> 35, WBC 4.8, lactate 3.0, patient has dyspeptic symptoms.  eICU Interventions  Troponin possibly of brain origin, blood cultures in progress to r/o endocarditis, TEE was ordered but is still pending at this time. Will give patient a 500 ml LR bolus. Follow up lactic acid level ordered, Pepcid ordered for dyspeptic symptoms.        [name] 10/11/2020, 10:47 PM

## 2020-10-11 NOTE — Progress Notes (Signed)
MD Ophelia Charter notified of spike in temp and increase RR & HR, with onset of A-fib. Mews score of 5. Code sepsis initiated along with afib protocol started, new orders placed per MD, Rapid response notified, patient moved to 3w-21e-link room per MD request

## 2020-10-11 NOTE — Progress Notes (Signed)
Occupational Therapy Treatment Patient Details Name: Mark May MRN: [id number] DOB: 05/30/1956 Today's Date: 10/11/2020    History of present illness  64 y.o. male admitted with L hemianopsia, L facial droop and L side weakness s/p R MCA revacularization on 11/5 with post procedure intubation   OT comments  Pt seen for OT follow up session with focus on ADL mobility and L sided facilitation techniques. Pt completed bed mobility with max A +2 and sit <> stands/stand pivot with max A +2. While EOB, pt with R gaze preference and limited ability to track or attend to therapist on L side. He states his vision is "just fine", suspect inattention component as well. Pt was minimally verbal this date, and when he did speak it was difficult to understand by the video interpreter. Due to communication barrier, it was difficult to fully assess cognition at this time. Pt was on 2L [place] (cannula out of nose on arrival) with O2 sats a ~89%. 2L was placed and pt recovered with increased time. D/c recs remain appropriate, will continue to follow.     Follow Up Recommendations  CIR;Supervision/Assistance - 24 hour    Equipment Recommendations  Wheelchair (measurements OT);Wheelchair cushion (measurements OT);Hospital bed;3 in 1 bedside commode    Recommendations for Other Services Rehab consult    Precautions / Restrictions Precautions Precautions: Fall Precaution Comments: L side hemiparesis; monitor BP; watch O2 Restrictions Weight Bearing Restrictions: No       Mobility Bed Mobility Overal bed mobility: Needs Assistance Bed Mobility: Supine to Sit     Supine to sit: +2 for physical assistance;Max assist     General bed mobility comments: Cues provided to manage LEs off EOB, with decreased initiation noted with B LEs with min movement on R and no activation noted on L. MaxAx2 to manage trunk and LEs.  Transfers Overall transfer level: Needs assistance Equipment used: 2 person hand held  assist Transfers: Stand Pivot Transfers Sit to Stand: Max assist;+2 physical assistance;+2 safety/equipment Stand pivot transfers: Max assist;+2 physical assistance;+2 safety/equipment       General transfer comment: L knee block and cues to come to stand and pivot towards R EOB > bedside chair, maxAx2 with min initiation noted to maintain balance.     Balance Overall balance assessment: Needs assistance Sitting-balance support: Bilateral upper extremity supported;Feet supported Sitting balance-Leahy Scale: Zero Sitting balance - Comments: MaxA at trunk to maintain static sitting balance EOB.   Standing balance support: Single extremity supported Standing balance-Leahy Scale: Zero Standing balance comment: maxA of 2 to maintain static stand with L knee block                            ADL either performed or assessed with clinical judgement   ADL Overall ADL's : Needs assistance/impaired                         Toilet Transfer: Maximal assistance;+2 for physical assistance;+2 for safety/equipment;Squat-pivot;BSC Toilet Transfer Details (indicate cue type and reason): simulated            General ADL Comments: Session focused on OOB mobility progression and L sided facilitation techniques for neuro recovery     Vision   Vision Assessment?: Vision impaired- to be further tested in functional context Additional Comments: pt with R gaze preference. Suspect field cut and possible neglect. Asked to attend to L visual field and pt required multimodal  cues. Will continue to assess- limited arousal and verbalizations this date for proper assessment   Perception     Praxis      Cognition Arousal/Alertness: Lethargic Behavior During Therapy: Flat affect Overall Cognitive Status: Impaired/Different from baseline Area of Impairment: Attention;Following commands;Problem solving                   Current Attention Level: Focused   Following Commands:  Follows one step commands inconsistently;Follows one step commands with increased time Safety/Judgement: Decreased awareness of safety;Decreased awareness of deficits Awareness: Intellectual Problem Solving: Slow processing;Decreased initiation;Difficulty sequencing;Requires verbal cues;Requires tactile cues General Comments: difficult to fully assess due to minimal verbalizations from pt. Required max cues and increased problem solving time for basic mobility tasks. at times was not comprehendible to interpreter        Exercises     Shoulder Instructions       General Comments [place] doffed upon arrival thus donned [place] as SpO2 was 89-90%; 2-3L/min of O2 via [place] provided throughout session with SpO2 93-94%    Pertinent Vitals/ Pain       Pain Assessment: Faces Pain Score: 0-No pain Pain Intervention(s): Limited activity within patient's tolerance;Monitored during session;Repositioned  Home Living                                          Prior Functioning/Environment              Frequency  Min 2X/week        Progress Toward Goals  OT Goals(current goals can now be found in the care plan section)  Progress towards OT goals: Progressing toward goals  Acute Rehab OT Goals Patient Stated Goal: to improve OT Goal Formulation: Patient unable to participate in goal setting Time For Goal Achievement: 10/23/20 Potential to Achieve Goals: Good  Plan Discharge plan remains appropriate    Co-evaluation    PT/OT/SLP Co-Evaluation/Treatment: Yes Reason for Co-Treatment: For patient/therapist safety;To address functional/ADL transfers PT goals addressed during session: Mobility/safety with mobility;Balance OT goals addressed during session: ADL's and self-care;Proper use of Adaptive equipment and DME;Strengthening/ROM      AM-PAC OT "6 Clicks" Daily Activity     Outcome Measure   Help from another person eating meals?: A Little Help from another person  taking care of personal grooming?: A Little Help from another person toileting, which includes using toliet, bedpan, or urinal?: A Lot Help from another person bathing (including washing, rinsing, drying)?: A Lot Help from another person to put on and taking off regular upper body clothing?: A Lot Help from another person to put on and taking off regular lower body clothing?: A Lot 6 Click Score: 14    End of Session    OT Visit Diagnosis: Unsteadiness on feet (R26.81);Muscle weakness (generalized) (M62.81);Other symptoms and signs involving cognitive function;Hemiplegia and hemiparesis Hemiplegia - 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

## 2020-10-11 NOTE — Progress Notes (Signed)
CRITICAL VALUE ALERT  Critical Value:  Absolute Neutrophil count = 4.6   Date & Time Notied:  10/11/2020 at 2040  Provider Notified: Sent page to V. Rathore  Orders Received/Actions taken:

## 2020-10-11 NOTE — Progress Notes (Signed)
Physical Therapy Treatment Patient Details Name: Mark May MRN: [id number] DOB: [birth year] Today's Date: 10/11/2020    History of Present Illness  64 y.o. male admitted with L hemianopsia, L facial droop and L side weakness s/p R MCA revacularization on 11/5 with post procedure intubation    PT Comments    Pt is more awake this date compared to previous session. Treated pt in conjunction with OT to maximize session and safety. Pt continues to display decreased initiation with all functional mobility, resulting in him requiring maxAx2 for all functional mobility. Provided extra time and max cues to attempt to have pt initiate muscle contractions and maintain his balance in sitting and standing, but min success noted. Pt is willing to participate though. His SpO2 levels were low at 93-94% with 2-3L/min of supplemental O2 via [place]. Attempted to educate pt on proper breathing in through nose and out through mouth, but no success. Will continue to follow acutely. Current recommendation remain appropriate at this time.    Follow Up Recommendations  CIR;Supervision/Assistance - 24 hour     Equipment Recommendations  3in1 (PT);Wheelchair (measurements PT);Wheelchair cushion (measurements PT);Hospital bed (may change as pt progresses)    Recommendations for Other Services Rehab consult     Precautions / Restrictions Precautions Precautions: Fall Precaution Comments: L side hemiparesis; monitor BP Restrictions Weight Bearing Restrictions: No    Mobility  Bed Mobility Overal bed mobility: Needs Assistance Bed Mobility: Supine to Sit     Supine to sit: +2 for physical assistance;Max assist     General bed mobility comments: Cues provided to manage LEs off EOB, with decreased initiation noted with B LEs with min movement on R and no activation noted on L. MaxAx2 to manage trunk and LEs.  Transfers Overall transfer level: Needs assistance Equipment used: 2 person hand held  assist Transfers: Stand Pivot Transfers   Stand pivot transfers: +2 physical assistance;Max assist       General transfer comment: L knee block and cues to come to stand and pivot towards R EOB > bedside chair, maxAx2 with min initiation noted to maintain balance.   Ambulation/Gait             General Gait Details: unable to attempt today   Stairs             Wheelchair Mobility    Modified Rankin (Stroke Patients Only) Modified Rankin (Stroke Patients Only) Pre-Morbid Rankin Score: No symptoms Modified Rankin: Severe disability     Balance Overall balance assessment: Needs assistance Sitting-balance support: Bilateral upper extremity supported;Feet supported Sitting balance-Leahy Scale: Zero Sitting balance - Comments: MaxA at trunk to maintain static sitting balance EOB.   Standing balance support: Single extremity supported Standing balance-Leahy Scale: Zero Standing balance comment: maxA of 2 to maintain static stand with L knee block                             Cognition Arousal/Alertness: Awake/alert Behavior During Therapy: Flat affect Overall Cognitive Status: Impaired/Different from baseline Area of Impairment: Attention;Following commands;Safety/judgement;Awareness;Problem solving                   Current Attention Level: Sustained   Following Commands: Follows one step commands inconsistently;Follows one step commands with increased time Safety/Judgement: Decreased awareness of safety;Decreased awareness of deficits Awareness: Intellectual Problem Solving: Slow processing;Decreased initiation;Difficulty sequencing;Requires verbal cues;Requires tactile cues General Comments: Pt minimally initiates movements this date despite max cues  and ample time provided.       Exercises      General Comments General comments (skin integrity, edema, etc.): [place] doffed upon arrival thus donned [place] as SpO2 was 89-90%; 2-3L/min of O2 via [place]  provided throughout session with SpO2 93-94%      Pertinent Vitals/Pain Pain Assessment: No/denies pain Pain Intervention(s): Limited activity within patient's tolerance;Monitored during session;Repositioned    Home Living                      Prior Function            PT Goals (current goals can now be found in the care plan section) Acute Rehab PT Goals Patient Stated Goal: to improve PT Goal Formulation: With patient Time For Goal Achievement: 10/16/2020 Potential to Achieve Goals: Fair Progress towards PT goals: Progressing toward goals    Frequency    Min 4X/week      PT Plan Current plan remains appropriate    Co-evaluation PT/OT/SLP Co-Evaluation/Treatment: Yes Reason for Co-Treatment: Complexity of the patient's impairments (multi-system involvement);For patient/therapist safety;To address functional/ADL transfers PT goals addressed during session: Mobility/safety with mobility;Balance        AM-PAC PT "6 Clicks" Mobility   Outcome Measure  Help needed turning from your back to your side while in a flat bed without using bedrails?: A Lot Help needed moving from lying on your back to sitting on the side of a flat bed without using bedrails?: A Lot Help needed moving to and from a bed to a chair (including a wheelchair)?: A Lot Help needed standing up from a chair using your arms (e.g., wheelchair or bedside chair)?: A Lot Help needed to walk in hospital room?: Total Help needed climbing 3-5 steps with a railing? : Total 6 Click Score: 10    End of Session Equipment Utilized During Treatment: Gait belt Activity Tolerance: Patient limited by fatigue Patient left: in chair;with call bell/phone within reach;with chair alarm set Nurse Communication: Mobility status;Other (comment) (current SpO2 measurements) PT Visit Diagnosis: Other abnormalities of gait and mobility (R26.89);Muscle weakness (generalized) (M62.81);Hemiplegia and  hemiparesis;Unsteadiness on feet (R26.81);Difficulty in walking, not elsewhere classified (R26.2);Other symptoms and signs involving the nervous system (R29.898) Hemiplegia - Right/Left: Left Hemiplegia - dominant/non-dominant: Non-dominant Hemiplegia - caused by: Cerebral infarction     Time: 1143-1208 PT Time Calculation (min) (ACUTE ONLY): 25 min  Charges:  $Therapeutic Activity: 8-22 mins                     Raymond Gurney, PT, DPT Acute Rehabilitation Services  Pager: [phone] Office: [phone]    Jewel Baize 10/11/2020, 4:05 PM

## 2020-10-11 NOTE — Progress Notes (Signed)
CRITICAL VALUE ALERT  Critical Value:  Troponin Alert = 43.0  Date & Time Notied:  10/11/2020 at 2041  Provider Notified: Sent page to V. Rathore  Orders Received/Actions taken:

## 2020-10-11 NOTE — Progress Notes (Signed)
CRITICAL VALUE ALERT  Critical Value: Lactic Acid = 3.1  Date & Time Notied:  10/11/2020  Provider Notified: Sent page to V. Rathore  Orders Received/Actions taken:

## 2020-10-11 NOTE — Progress Notes (Signed)
ANTICOAGULATION CONSULT NOTE - Initial Consult  Pharmacy Consult for Heparin, Vanco, Cefepime Indication: Afib, s/p CVA , sepsis  No Known Allergies  Patient Measurements:   Heparin Dosing Weight:  56.7 kg  Vital Signs: Temp: 102.6 F (39.2 C) (11/11 1724) Temp Source: Rectal (11/11 1724) BP: 84/66 (11/11 1707) Pulse Rate: 187 (11/11 1707)  Labs: Recent Labs    10/09/20 0329 10/09/20 1955 10/09/20 2202 10/10/20 0259 10/11/20 0915  HGB 14.6  --   --  13.8  --   HCT 43.2  --   --  41.0  --   PLT 199  --   --  203  --   CREATININE 1.48*  --   --  1.69* 1.96*  TROPONINIHS  --  8 6  --   --     Estimated Creatinine Clearance: 30.1 mL/min (A) (by C-G formula based on SCr of 1.96 mg/dL (H)).   Medical History: Past Medical History:  Diagnosis Date  . Gout   . Hypertension     Assessment: CC/HPI: 81 YOM who presented on 11/5 with R MCA stroke s/p IR + stent placement for reocclusion   PMH: gout, HTN, BPH, sciatica, stomach ulcer s/p surgery,   Anticoag: CVA s/p thrombectomy, stent placement. New onset afib. Hgb 13.8. Plts 203. Plan to start IV heparin.  ID: T-spike 102.6. WBC 5.1, Scr 1.96 (up) Vanco 11/11>> Cefepime 11/11>> Flagyl 11/11>>  Goal of Therapy:  Heparin level 0.3-0.5 units/ml Monitor platelets by anticoagulation protocol: Yes  Vanco trough 15-20   Plan:  Vanco 1g x 1 then 750mg  IV q24h (drop to 500mg /24h if Scr con't to rise). Trough after 3-5 doses at steady state.  Cefepime 2g IV q24h  Flagyl IV x 1 then change to po 500mg /8hr due to critical shortage  IV heparin for new afib. No bolus due to CVA. Heparin infusion at 800 units/hr. Daily HL and CBC. Low goal 0.3-0.5    Reyne Falconi S. 01-08-1988, PharmD, BCPS Clinical Staff Pharmacist [email or web address] , Heinrich Fertig Stillinger 10/11/2020,5:26 PM

## 2020-10-11 NOTE — Consult Note (Signed)
Medical Consultation   Mark May  UEK:[id number]  DOB: 08-18-1956  DOA: 10/28/2020  PCP: Reubin Milan, MD   Outpatient Specialists: None   Requesting physician: Stroke team  Reason for consultation: Need medical help.  Creatinine slowly increasing.  Stooped lisinopril today.   History of Present Illness: Mark May is an 64 y.o. male with h/o HTN and stomach ulcer surgery who presented to [facility] on 11/5 with CVA.  He was found to have right M1 high-grade stenosis vs. Occlusive thrombus and was transferred to [facility] for thrombectomy.  During hospitalization he experienced acute hypoxic respiratory failure due to pulmonary edema, related to acute diastolic CHF.  He is awaiting transfer to CIR.  The patient appeared ill with mild tachypnea and increased somnolence at the time of my evaluation.  He did ask that I call to speak with his son.  His nurse just called to report that the patient is in afib to 150s at this time (new?) and also febrile.    Review of Systems:  ROS As per HPI otherwise 10 point review of systems negative. Limited by language and patient lethargy   Past Medical History: Past Medical History:  Diagnosis Date  . Gout   . Hypertension     Past Surgical History: Past Surgical History:  Procedure Laterality Date  . PARTIAL GASTRECTOMY  1990   done in [place] for PUD  . PROSTATE BIOPSY  ~ 2005   negative for cancer (done in [place])  . RADIOLOGY WITH ANESTHESIA N/A 10/04/2020   Procedure: IR WITH ANESTHESIA;  Surgeon: Radiologist, Medication, MD;  Location: [facility] OR;  Service: Radiology;  Laterality: N/A;     Allergies:  No Known Allergies   Social History:  reports that he has never smoked. He has never used smokeless tobacco. He reports that he does not drink alcohol and does not use drugs.   Family History: Family History  Problem Relation Age of Onset  . Diabetes Mother   . Diabetes Father       Physical Exam: Vitals:   10/11/20  1209 10/11/20 1613 10/11/20 1705 10/11/20 1707  BP: 123/74 (!) 146/77 (!) 104/59 (!) 84/66  Pulse: (!) 116  (!) 147 (!) 187  Resp: 20 20 (!) 30 (!) 30  Temp: 98 F (36.7 C) (!) 101.9 F (38.8 C) 97.8 F (36.6 C) 98.7 F (37.1 C)  TempSrc: Oral Oral Oral Oral  SpO2: 96% 92% 94% 94%    Constitutional: Somnolent, limited conversation, mildly uncomfortable in appearance without specific c/o pain Eyes:  EOMI, irises appear normal, anicteric sclera,  ENMT: external ears and nose appear normal, normal hearing, Lips appear normal, oropharynx mucosa mildly dry  Neck: neck appears normal, no masses, normal ROM CVS: RR with tachycardia to 120, no LE edema Respiratory:  clear to auscultation bilaterally, no wheezing, rales or rhonchi. Respiratory effort increased to 20s.  Abdomen: soft nontender, nondistended Musculoskeletal: : LUE is flaccid, LLE appears weaker than R Neuro: mild L facial droop Psych: judgement and insight appear impaired, flat mood and affect, mental status Skin: no rashes or lesions or ulcers, no induration or nodules    Data reviewed:  I have personally reviewed the recent labs and imaging studies  Pertinent Labs:   CO2 19 Glucose 248 BUN 47/Creatinine 1.9/GFR 37; increasing daily since 11/7, when it was 7/0.81/>60 Albumin 2.6 AST 53/ALT 38/Bilii 2.4 UA: small hgb, 100 protein  Inpatient Medications:   Scheduled Meds: .  stroke: mapping our early stages of recovery book   Does not apply Once  . atorvastatin  20 mg Oral Daily  . chlorhexidine  15 mL Mouth Rinse BID  . Chlorhexidine Gluconate Cloth  6 each Topical Daily  . diltiazem  15 mg Intravenous Once  . mouth rinse  15 mL Mouth Rinse q12n4p  . tamsulosin  0.4 mg Oral QPC breakfast  . ticagrelor  90 mg Oral BID   Or  . ticagrelor  90 mg Per Tube BID   Continuous Infusions: . sodium chloride 100 mL/hr at 10/11/20 0414  . ceFEPime (MAXIPIME) IV    . diltiazem (CARDIZEM) infusion    . metronidazole     . vancomycin       Radiological Exams on Admission: DG Chest 1 View  Result Date: 10/09/2020 CLINICAL DATA:  Chest pain. EXAM: CHEST  1 VIEW COMPARISON:  None. FINDINGS: The heart size and mediastinal contours are within normal limits. Both lungs are clear. The visualized skeletal structures are unremarkable. Aortic calcifications are noted. IMPRESSION: No active disease. Electronically Signed   By: Katherine Mantle M.D.   On: 10/09/2020 19:52   DG Abd Portable 1V  Result Date: 10/10/2020 CLINICAL DATA:  Abdominal distension EXAM: PORTABLE ABDOMEN - 1 VIEW COMPARISON:  10/07/2020 FINDINGS: Nasogastric tube is been removed. Contrast administered during a fluoroscopic examination of levin/8/21 is now seen throughout the colon and rectum which is nondilated. Normal abdominal gas pattern. No gross free intraperitoneal gas. Osseous structures are unremarkable. Previously noted renal calculi are obscured by intraluminal contrast. IMPRESSION: Normal abdominal gas pattern. Electronically Signed   By: Helyn Numbers MD   On: 10/10/2020 22:26    Impression/Recommendations Principal Problem:   Middle cerebral artery embolism, right Active Problems:   Benign hypertension   Benign prostatic hyperplasia with urinary frequency   Mild hyperlipidemia   Atrial fibrillation, new onset (HCC)   Severe sepsis (HCC)   AKI (acute kidney injury) (HCC)   CVA -Patient was admitted to the neurology service for acute MCA CVA and is s/p revascularization procedure and post-extubation -He has been withering over the last few days and so TRH was asked to consult -This AM at the time of my evaluation, he looked ill but it was nonspecific -At this time, he appears to be septic -He is at moderate aspiration risk by ST evaluation and so aspiration PNA appears to be his most likely source -He has also developed afib with RVR  Sepsis - severe +/- shock -Sepsis indicates life-threatening organ dysfunction with  mortality >10%, caused by dysregulation to host response.   -SIRS criteria in this patient includes:  fever, tachycardia , tachypnea -Patient has evidence of acute organ failure with recurrent hypotension (SBP < 90 or MAP < 65 x 2 readings) that is not easily explained by another condition. -While awaiting blood cultures, this appears to be a preseptic condition. -Sepsis protocol initiated -Worse outcomes are predicted from sepsis with 2 of the following: RR > 22; AMS , GCS < 13; or SBP <100.  This patient meets 3 of these criteria. -Patient had SBP <90/MAP <65 and so has received the 30 cc/kg IVF bolus. -Suspected source is aspiration PNA, although this is very speculative. -Blood and urine cultures pending -Will transfer to progressive care, although I have also consulted PCCM and he may be transferred to the ICU instead. -Treat with IV Flagyl/Cefepime/Vanc for undifferentiated sepsis -Will trend lactate  -Will order  procalcitonin level.  Antibiotics would not be indicated for PCT <0.1 and probably should not be used for < 0.25.  >0.5 indicates infection and >>0.5 indicates more serious disease.  As the procalcitonin level normalizes, it will be reasonable to consider de-escalation of antibiotic coverage. -This patient is at risk for shock and may require vasopressors to keep MAP >65 and/or due to lactate >2 despite volume resuscitation; shock is associated with >40% mortality.  New onset afib   -Patient presenting with new-onset afib, likely related to his acute infection.  -For now, will focus on rate control and searching for the underlying cause at this time. -BP is unstable and so drip may be challenging; he is receiving an IVF bolus for now and PCCM is coming to evaluate. -Diltiazem drip as per protocol with plan to transition to PO Diltiazem once heart rate is controlled (resting HR 110 or lower) -He was started on Heparin for [facility]; may need to hold Brilinta and ASA is held at this  time  AKI -Creatinine has been increasing daily since 11/7 and is now almost 2 -Likely prerenal in the setting of developing sepsis -IVF bolus has been ordered -Likely also needs continuous infusion but will need to be monitored for evidence of volume overload  HTN -Hold Lopressor for now in the setting of afib with RVR and hypotension  BPH -Hold Flomax for now      Thank you for this consultation.  Our [facility] hospitalist team will follow the patient with you if PCCM does not assume care.  I attempted to reach his son (as requested by the patient) and was unable to reach him or to leave voice mail.   Time Spent: 65 minutes  Total critical care time: 65 minutes Critical care time was exclusive of separately billable procedures and treating other patients. Critical care was necessary to treat or prevent imminent or life-threatening deterioration. Critical care was time spent personally by me on the following activities: development of treatment plan with patient and/or surrogate as well as nursing, discussions with consultants, evaluation of patient's response to treatment, examination of patient, obtaining history from patient or surrogate, ordering and performing treatments and interventions, ordering and review of laboratory studies, ordering and review of radiographic studies, pulse oximetry and re-evaluation of patient's condition.  Jonah Blue M.D. [facility] 10/11/2020, 5:12 PM

## 2020-10-11 NOTE — Progress Notes (Signed)
STROKE TEAM PROGRESS NOTE   INTERVAL HISTORY HR and resp rate up from yesterday. Afebrile. CXR normal. Cre continues to rise. stopped lisinopril and added metoprolol. Will get IM to consult.  Neurologically more alert and interactive today.  Continues to have significant left hemiplegia and dysarthria He had complained of abdominal pain with some distention last night and x-ray abdomen was obtained which was normal. OBJECTIVE Vitals:   10/10/20 2300 10/11/20 0000 10/11/20 0400 10/11/20 0823  BP: 112/74 131/72 126/64 134/77  Pulse: (!) 108 (!) 110 (!) 115 (!) 120  Resp: (!) 26 (!) 30 (!) 24 20  Temp: 100.3 F (37.9 C) 98.9 F (37.2 C) 98.8 F (37.1 C) 99.5 F (37.5 C)  TempSrc: Oral Oral Oral Oral  SpO2: 96% 95% 95% 96%   CBC:  Recent Labs  Lab 10/07/2020 1246 10/14/2020 2024 10/06/20 0522 10/07/20 0527 10/09/20 0329 10/10/20 0259  WBC 7.1  --  10.7*   < > 6.5 5.1  NEUTROABS 5.2  --  8.2*  --   --   --   HGB 16.5   < > 13.5   < > 14.6 13.8  HCT 49.1   < > 40.4   < > 43.2 41.0  MCV 89.4  --  89.2   < > 89.1 88.7  PLT 291  --  232   < > 199 203   < > = values in this interval not displayed.   Basic Metabolic Panel:  Recent Labs  Lab 10/08/20 0727 10/09/20 0329 10/10/20 0259 10/11/20 0915  NA 139   < > 137 139  K 3.6   < > 3.4* 4.1  CL 101   < > 105 111  CO2 26   < > 23 19*  GLUCOSE 130*   < > 165* 248*  BUN 18   < > 36* 47*  CREATININE 1.39*   < > 1.69* 1.96*  CALCIUM 7.8*   < > 8.1* 7.7*  MG 1.8  --   --   --   PHOS 4.9*  --   --   --    < > = values in this interval not displayed.   Lipid Panel:     Component Value Date/Time   CHOL 173 10/06/2020 0522   CHOL 216 (H) 03/09/2018 1620   TRIG 396 (H) 10/07/2020 0527   HDL 33 (L) 10/06/2020 0522   HDL 59 03/09/2018 1620   CHOLHDL 5.2 10/06/2020 0522   VLDL 55 (H) 10/06/2020 0522   LDLCALC 85 10/06/2020 0522   LDLCALC 136 (H) 03/09/2018 1620   HgbA1c:  Lab Results  Component Value Date   HGBA1C 5.8 (H)  10/06/2020   Urine Drug Screen: No results found for: LABOPIA, COCAINSCRNUR, LABBENZ, AMPHETMU, THCU, LABBARB  Alcohol Level No results found for: [facility]  IMAGING  CT HEAD WO CONTRAST 10/19/2020 1. No acute abnormality.  2. Minimal diffuse cerebral and cerebellar atrophy.  3. Mild chronic small vessel white matter ischemic changes in both cerebral hemispheres.   CT Angio Head W or Wo Contrast CT Angio Neck W and/or Wo Contrast CT CEREBRAL PERFUSION W CONTRAST 10/17/2020 1. 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  2. Right MCA infarct with extensive penumbra.  3. 4 mm right supraclinoid ICA aneurysm.  4. Widely patent cervical carotid and vertebral arteries.  5. Aortic Atherosclerosis (ICD10-I70.0).   CT HEAD WO CONTRAST 10/24/2020 Hypodensity in the right lateral temporal lobe now shows mild hemorrhage or contrast enhancement. This  is most consistent with acute infarct. There has been interval stenting of the right middle cerebral artery.   CT HEAD WO CONTRAST 10/24/2020 Right temporal lobe hypodensity compatible with acute infarct. Decreased hyperdensity within this stroke compared with 4 hours previously compatible with contrast staining rather than hemorrhage. Small area of acute infarct in the right parietal lobe now evident. Right MCA stent.   MRI brain: 10/06/20 1. Fairly extensive patchy acute/early subacute infarcts within the right MCA/watershed territory.  2. Additional small acute/early subacute infarcts within the dorsal right thalamus and left basal ganglia. 3. Background moderate chronic small vessel ischemic disease.  MRA head: 10/06/20 1. Interval stenting of the M1/M2 right middle cerebral artery. Flow related signal is present proximal and distal to the stent suggestive of stent patency. 2. 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  DG Abd 1 View 10/30/2020 Gastric catheter within the stomach kinked at the  proximal side port. This could be withdrawn slightly S necessary.   Transthoracic Echocardiogram  10/06/2020 1. Left ventricular ejection fraction, by estimation, is 70 to 75%. The left ventricle has hyperdynamic function. The left ventricle has no regional wall motion abnormalities. Left ventricular diastolic parameters are consistent with Grade II diastolic dysfunction (pseudonormalization). Elevated left atrial pressure.  2. Right ventricular systolic function is normal. The right ventricular size is normal.  3. The mitral valve is normal in structure. Mild mitral valve regurgitation. No evidence of mitral stenosis.  4. The aortic valve is normal in structure. Aortic valve regurgitation is not visualized. No aortic stenosis is present.  5. The inferior vena cava is dilated in size with <50% respiratory variability, suggesting right atrial pressure of 15 mmHg.  ECG - SR rate 70 BPM. (See cardiology reading for complete details)   PHYSICAL EXAM     Blood pressure 134/77, pulse (!) 120, temperature 99.5 F (37.5 C), temperature source Oral, resp. rate 20, SpO2 96 %.  Frail elderly [place] male. Afebrile. Head is nontraumatic. Neck is supple without bruit.    Cardiac exam no murmur or gallop. Lungs are clear to auscultation. Distal pulses are well felt. Neurological Exam : He is awake and appears and follows few simple midline commands.Marland Kitchen PERRL, grimaces to stim, opens eyes spontaneously, conjugate midline gaze right preference,blinks on the right not on the left ,left lower facial weakness.  Purposeful and spontaneous movements on the right. , no movement left arm.  Does withdraw left legs to pain.  Gait deferred.     ASSESSMENT/PLAN Mr. Mark May is a 64 y.o. male with history of hypertension, BPH, sciatica, stomach ulcer status post surgery in the past presented to ER at [facility] for acute onset left-sided weakness, left facial droop and left hemianopia.  CT head and  neck showed right M1 high-grade stenosis versus near occlusive thrombosis.  CT perfusion showed large penumbra.  Patient transferred to [facility] for thrombectomy.  He did not receive IV t-PA due to late presentation (>4.5 hours from time of onset). IR - complete occlusion of RT MCA M 1 seg - thrombectomy ->TICI 3 revascularization with placement of rescue stent m4 mm x 24 mm Atlas for reocclusion with restoration of TICI 3 .  Stroke: Rt lateral temporal infarct s/p IR TICI3 R M1 w/ stent placement - thromboembolic d/t large vessel disease  CT Head - No acute abnormality. Minimal diffuse cerebral and cerebellar atrophy. Mild chronic small vessel white matter ischemic changes in both cerebral hemispheres.  CTA H&N - Severe distal right M1 stenosis or subocclusive thrombus. 4 mm right supraclinoid ICA aneurysm. Widely patent cervical carotid and vertebral arteries.   CT Perfusion - Right MCA infarct with extensive penumbra.   CT head - Hypodensity in the right lateral temporal lobe now shows mild hemorrhage or contrast enhancement. This is most consistent with acute infarct. There has been interval stenting of the right middle cerebral artery.   CT Head - Right temporal lobe hypodensity compatible with acute infarct. Decreased hyperdensity within this stroke compared with 4 hours previously compatible with contrast staining rather than hemorrhage. Small area of acute infarct in the right parietal lobe now evident. Right MCA stent.  MRI head - Fairly extensive patchy acute/early subacute infarcts within the right MCA/watershed territory. Additional small acute/early subacute infarcts within the dorsal right thalamus and left basal ganglia.  MRA head -  Interval stenting of the M1/M2 right middle cerebral artery. Flow related signal is present proximal and distal to the stent suggestive of stent patency. 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  2D Echo - EF 70  - 75%. No cardiac source of emboli identified.   UE doppler neg DVT  Loyal Jacobson Virus 2 - negative  LDL - 85  HgbA1c - 5.8  VTE prophylaxis - SCDs  No antithrombotic prior to admission, now on aspirin 81 mg daily and Brilinta (ticagrelor) 90 mg bid following bolus with cangrelor. Continue.    Therapy recommendations:  CIR  Disposition:  Pending  Acute hypoxic respiratory failure, due to pulmonary edema, requiring BiPAP  Recurrent intubation for agitation, respiratory distress, hypoxia  Off temporary bipap    tachypnea    Acute diastolic heart failure with pulmonary edema  Improved after lasix   temporary bipap at that time   Hypertension Tachycardia  Home BP meds: Amlodipine  off Cleviprex  Stable . BP goal <180 . Stopped lisinopril d/t rising Cre. Added metoprolol . Long-term BP goal normotensive  Hyperlipidemia  Home Lipid lowering medication: none  LDL 85, goal < 70  Current lipid lowering medication when has po access: added Lipitor 20 (not high intensity as LDL almost at goal)  Continue statin at discharge  Dysphagia . Secondary to stroke . MBSS cleared for D3 nectar thick liquids  . Speech on board   Other Stroke Risk Factors  Advanced age  Other Active Problems  Pt speaks Madarin    Aortic Atherosclerosis (ICD10-I70.0).   4 mm right supraclinoid ICA aneurysm.  Hypokalemia - 3.0 - supplement - recheck 3.6 -3.4-4.1 - resolved  Olecranon bursitis, left    ARF Cre rising daily - 1.48-1.69-1.96. Stopped lisinopril. On IVF  Low grade temp 100.3. WBC normal  TRH consulted to assist with medical management  Hospital day # 6  Continue IV hydration and discontinue lisinopril.  Start metoprolol for blood pressure and rate control.  Consult internal medicine to help with ongoing worsening renal failure, tachycardia and low-grade fever.  Discussed with patient and Dr. Jonah Blue medical hospitalist. Greater than 50% time during this  25-minute visit was spent on counseling and coordination of care and discussion with care team about his condition Delia Heady, MD  to contact Stroke Continuity provider, please refer to [email or web address]. After hours, contact General Neurology

## 2020-10-11 NOTE — Progress Notes (Signed)
SLP Cancellation Note  Patient Details Name: Mark May MRN: [id number] DOB: 1956/05/19   Cancelled treatment:       Reason Eval/Treat Not Completed: Patient at procedure or test/unavailable; spoke with nursing; tolerating meds without s/s of aspiration; ST will continue efforts.   Tressie Stalker, M.S., CCC-SLP 10/11/2020, 12:05 PM

## 2020-10-11 NOTE — Progress Notes (Signed)
NAME:  Mark May, MRN:  [id number], DOB:  [birth year], LOS: 6 ADMISSION DATE:  10/19/2020, CONSULTATION DATE: 10/26/2020 REFERRING MD: Dr. Curtis Sites, CHIEF COMPLAINT: Left-sided weakness  Brief History   64 year old male with hypertension and hyperlipidemia who presented with left-sided weakness, noted to have right MCA occlusion status post thrombectomy and stent placement 11/5.  Hospitalization complicated by hypoxic respiratory failure secondary to pulmonary edema requiring BiPAP and lasix.  Awaiting CIR placement however on the evening of 10/10, he was noted to become more lethargic with tachycardia and tachypnea.  Had complained of abdominal pain with some distention and chest pain which resolved after burping.  KUB obtained which was normal.  On 11/11, some what more responsive but now with increasing sCr in which lisinopril was stopped and development of fever 102.6  [facility] initially consulted for help with medical management however on their evaluation, PCCM consulted given ill appearance and developing hypotension and new onset Afib with RVR.  Past Medical History  Hypertension Peptic ulcer disease Gout BPH Sciatica   Significant Hospital Events   11/5 Mechanical thrombectomy of right MCA and stent 11/5 11/11 PCCM reconsulted   Consults:  IR Neurology PCCM 11/5- 11/8; 11/11 [facility]  Procedures:  11/6 right femoral sheath was removed  Significant Diagnostic Tests:  11/5 CT head: No acute abnormality. Minimal diffuse cerebral and cerebellar atrophy. Mild chronic small vessel white matter ischemic changes in both cerebral hemispheres  11/5 CTA head and neck: Severe distal right M1 stenosis or subocclusive thrombus, Right MCA infarct with extensive penumbra, 4 mm right supraclinoid ICA aneurysm. Widely patent cervical carotid and vertebral arteries.  11/5 postprocedure CT head: Hypodensity in the right lateral temporal lobe now shows mild hemorrhage or contrast enhancement. This is  most consistent with acute infarct. There has been interval stenting of the right middle cerebral artery.  11/6 MRI brain: 1. Fairly extensive patchy acute/early subacute infarcts within the right MCA/watershed territory.  2. Additional small acute/early subacute infarcts within the dorsal right thalamus and left basal ganglia. 3. Background moderate chronic small vessel ischemic disease.  11/6  MRA head: 1. Interval stenting of the M1/M2 right middle cerebral artery. Flow related signal is present proximal and distal to the stent suggestive of stent patency. 2. 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  11/5 DG Abd 1 View Gastric catheter within the stomach kinked at the proximal side port. This could be withdrawn slightly S necessary.   11/6 Transthoracic Echocardiogram  1. Left ventricular ejection fraction, by estimation, is 70 to 75%. The left ventricle has hyperdynamic function. The left ventricle has no regional wall motion abnormalities. Left ventricular diastolic parameters are consistent with Grade II diastolic dysfunction (pseudonormalization). Elevated left atrial pressure.  2. Right ventricular systolic function is normal. The right ventricular size is normal.  3. The mitral valve is normal in structure. Mild mitral valve regurgitation. No evidence of mitral stenosis.  4. The aortic valve is normal in structure. Aortic valve regurgitation is not visualized. No aortic stenosis is present.  5. The inferior vena cava is dilated in size with <50% respiratory variability, suggesting right atrial pressure of 15 mmHg.  11/10 KUB >> normal gas pattern  Micro Data:  11/5 MRSA PCR negative 11/10 UC >> 11/11 BCx2 >>  Antimicrobials:  11/5 cefazolin 11/11 Zosyn >  Interim history/subjective:    Objective   Blood pressure (!) 84/66, pulse (!) 187, temperature (!) 102.6 F (39.2 C), temperature source Rectal, resp. rate Marland Kitchen)  30, SpO2 94  %.        Intake/Output Summary (Last 24 hours) at 10/11/2020 1727 Last data filed at 10/11/2020 1530 Gross per 24 hour  Intake 340 ml  Output 800 ml  Net -460 ml   There were no vitals filed for this visit.  Examination:   General: Acutely ill appearing male, lying on the bed, diaphoretic HEAENT: [place]/AT, eyes anicteric.  Dry mucous membranes Neuro: Awake, following commands.  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 and right MCA stent placement by IR with TICI 3 Acute diastolic congestive heart failure Hypertension Hyperlipidemia  Due to stroke patient may have dysphagia leading to aspiration Bedside ultrasound was done which showed right lower lobe consolidation and left lower lobe atelectasis with no effusion On cardiac exam LV cavity looked empty with minimal IVC visible Patient MRSA screen is negative We will start him on Zosyn for possible aspiration pneumonia leading to right lower lobe infiltrate I would give him IV fluid bolus based on sepsis protocol 30 cc/kg Follow-up blood, urine and respiratory culture and adjust antibiotics accordingly Patient has worsening AKI, he looked severely dry on physical exam and on bedside POCUS exam We will give him IV fluid and repeat serum creatinine in the morning Patient's heart rate is not well controlled currently running in 150s likely due to sepsis Once fever is controlled I think his heart rate will be better controlled Currently he is relatively hypotensive,  will give him amiodarone bolus and then start him on amnio infusion Patient is on heparin infusion for secondary stroke prophylaxis Continue statin and Brilinta Continue speech and swallow evaluation Patient is not fluid overloaded at this time, would avoid diuretic therapy Continue telemetry monitoring  PCCM will continue to follow along, please call with questions  Best practice:  Diet: Speech and swallow evaluation Pain/Anxiety/Delirium protocol (if indicated): N/A VAP protocol (if indicated): N/A DVT prophylaxis: Heparin infusion GI prophylaxis: Pepcid Glucose control: SSI Mobility: Bedrest Code Status: Full code Family Communication: Per primary team Disposition: per primary   Labs   CBC: Recent Labs  Lab 2020-10-06 1246 10-06-2020 2024 10/06/20 0522 10/07/20 0527 10/08/20 0727 10/09/20 0329 10/10/20 0259  WBC 7.1  --  10.7* 11.0* 8.4 6.5 5.1  NEUTROABS 5.2  --  8.2*  --   --   --   --   HGB 16.5   < > 13.5 13.9 14.6 14.6 13.8  HCT 49.1   < > 40.4 40.4 43.7 43.2 41.0  MCV 89.4  --  89.2 89.0 88.8 89.1 88.7  PLT 291  --  232 238 212 199 203   < > = values in this interval not displayed.    Basic Metabolic Panel: Recent Labs  Lab 10/07/20 0527 10/08/20 0727 10/09/20 0329 10/10/20 0259 10/11/20 0915  NA 139 139 138 137 139  K 3.0* 3.6 3.6 3.4* 4.1  CL 109 101 103 105 111  CO2 21* 26 24 23  19*  GLUCOSE 141* 130* 151* 165* 248*  BUN 7* 18 27* 36* 47*  CREATININE 0.81 1.39* 1.48* 1.69* 1.96*  CALCIUM 7.7* 7.8*  8.2* 8.1* 7.7*  MG  --  1.8  --   --   --   PHOS  --  4.9*  --   --   --    GFR: Estimated Creatinine Clearance: 30.1 mL/min (A) (by C-G formula based on SCr of 1.96 mg/dL (H)). Recent Labs  Lab 10/07/20 0527 10/08/20 0727 10/09/20 0329 10/10/20 0259  WBC 11.0* 8.4 6.5 5.1    Liver Function Tests: Recent Labs  Lab 10/28/2020 1246 10/08/20 0727  AST 24 53*  ALT 32 38  ALKPHOS 113 121  BILITOT 0.9 2.4*  PROT 7.3 5.9*  ALBUMIN 4.3 2.6*    No results for input(s): LIPASE, AMYLASE in the last 168 hours. No results for input(s): AMMONIA in the last 168 hours.  ABG    Component Value Date/Time   PHART 7.415 10/17/2020 2024   PCO2ART 38.0 10/07/2020 2024   PO2ART 205 (H) 10/02/2020 2024   HCO3 24.4 10/06/2020 2024   TCO2 26 10/28/2020 2024   O2SAT 100.0 10/07/2020 2024     Coagulation Profile: Recent Labs  Lab 10/27/2020 1246  INR 0.9    Cardiac Enzymes: No results for input(s): CKTOTAL, CKMB, CKMBINDEX, TROPONINI in the last 168 hours.  HbA1C: Hgb A1c MFr Bld  Date/Time Value Ref Range Status  10/06/2020 05:22 AM 5.8 (H) 4.8 - 5.6 % Final    Comment:    (NOTE) Pre diabetes:          5.7%-6.4%  Diabetes:              >6.4%  Glycemic control for   <7.0% adults with diabetes     CBG: Recent Labs  Lab 10/08/20 0438 10/08/20 0750 10/08/20 1137 10/08/20 1547 10/10/20 1454  GLUCAP 157* 135* 118* 150* 154*   Total critical care time: 45 minutes  Performed by: Cheri Fowler   Critical care time was exclusive of separately billable procedures and treating other patients.   Critical care was necessary to treat or prevent imminent or life-threatening deterioration.   Critical care was time spent personally by me on the following activities: development of treatment plan with patient and/or surrogate as well as nursing, discussions with consultants, evaluation of patient's response to treatment, examination of patient, obtaining history from patient or surrogate, ordering and performing treatments and interventions, ordering and review of laboratory studies, ordering and review of radiographic studies, pulse oximetry and re-evaluation of patient's condition.   Cheri Fowler MD Critical care physician [facility] [place] [facility]  Pager: [phone] Mobile: [phone]

## 2020-10-11 NOTE — Progress Notes (Signed)
Pharmacy Antibiotic Note  Mark May is a 64 y.o. male admitted on 10/10/2020 with CVA. Pharmacy has been consulted for Zosyn dosing for sepsis. Code Sepsis called and pharmacy consulted for vancomycin and cefepime and started on metronidazole per MD. Sherron Monday with NP, Raymon Mutton, and plan to continue with monotherapy of Zosyn.  Tmax 102.6. Scr is trending up with current CrCl of 30.1 mL/min.   Plan: Give Zosyn 3.375g IV 30 minute infusion followed by Zosyn 3.375g IV q8h extended infusion Monitor renal function, cultures/sensitivities, and clinical progression      Temp (24hrs), Avg:99.5 F (37.5 C), Min:97.8 F (36.6 C), Max:102.6 F (39.2 C)  Recent Labs  Lab 10/06/20 0522 10/06/20 0522 10/07/20 0527 10/08/20 0727 10/09/20 0329 10/10/20 0259 10/11/20 0915  WBC 10.7*  --  11.0* 8.4 6.5 5.1  --   CREATININE 1.02   < > 0.81 1.39* 1.48* 1.69* 1.96*   < > = values in this interval not displayed.    Estimated Creatinine Clearance: 30.1 mL/min (A) (by C-G formula based on SCr of 1.96 mg/dL (H)).    No Known Allergies  Antimicrobials this admission: Zosyn 11/11 >>   Dose adjustments this admission: N/A  Microbiology results: 11/11 Ucx:  11/11 Bcx:  11/11 MRSA PCR  Thank you for allowing pharmacy to be a part of this patient's care.  Vicente Serene 10/11/2020 5:58 PM

## 2020-10-12 DIAGNOSIS — I10 Essential (primary) hypertension: Secondary | ICD-10-CM | POA: Diagnosis not present

## 2020-10-12 DIAGNOSIS — I6601 Occlusion and stenosis of right middle cerebral artery: Secondary | ICD-10-CM | POA: Diagnosis not present

## 2020-10-12 DIAGNOSIS — I4891 Unspecified atrial fibrillation: Secondary | ICD-10-CM

## 2020-10-12 DIAGNOSIS — I639 Cerebral infarction, unspecified: Secondary | ICD-10-CM | POA: Diagnosis not present

## 2020-10-12 DIAGNOSIS — N179 Acute kidney failure, unspecified: Secondary | ICD-10-CM | POA: Diagnosis not present

## 2020-10-12 LAB — CBC WITH DIFFERENTIAL/PLATELET
Abs Immature Granulocytes: 0 10*3/uL (ref 0.00–0.07)
Basophils Absolute: 0 10*3/uL (ref 0.0–0.1)
Basophils Relative: 0 %
Eosinophils Absolute: 0.1 10*3/uL (ref 0.0–0.5)
Eosinophils Relative: 2 %
HCT: 36.7 % — ABNORMAL LOW (ref 39.0–52.0)
Hemoglobin: 12.4 g/dL — ABNORMAL LOW (ref 13.0–17.0)
Lymphocytes Relative: 8 %
Lymphs Abs: 0.4 10*3/uL — ABNORMAL LOW (ref 0.7–4.0)
MCH: 29.4 pg (ref 26.0–34.0)
MCHC: 33.8 g/dL (ref 30.0–36.0)
MCV: 87 fL (ref 80.0–100.0)
Monocytes Absolute: 0.1 10*3/uL (ref 0.1–1.0)
Monocytes Relative: 2 %
Neutro Abs: 4.8 10*3/uL (ref 1.7–7.7)
Neutrophils Relative %: 88 %
Platelets: 218 10*3/uL (ref 150–400)
RBC: 4.22 MIL/uL (ref 4.22–5.81)
RDW: 14.3 % (ref 11.5–15.5)
WBC: 5.4 10*3/uL (ref 4.0–10.5)
nRBC: 0 % (ref 0.0–0.2)

## 2020-10-12 LAB — MAGNESIUM: Magnesium: 2.2 mg/dL (ref 1.7–2.4)

## 2020-10-12 LAB — BASIC METABOLIC PANEL
Anion gap: 11 (ref 5–15)
BUN: 42 mg/dL — ABNORMAL HIGH (ref 8–23)
CO2: 19 mmol/L — ABNORMAL LOW (ref 22–32)
Calcium: 8 mg/dL — ABNORMAL LOW (ref 8.9–10.3)
Chloride: 113 mmol/L — ABNORMAL HIGH (ref 98–111)
Creatinine, Ser: 1.61 mg/dL — ABNORMAL HIGH (ref 0.61–1.24)
GFR, Estimated: 47 mL/min — ABNORMAL LOW (ref >60)
Glucose, Bld: 132 mg/dL — ABNORMAL HIGH (ref 70–99)
Potassium: 3.8 mmol/L (ref 3.5–5.1)
Sodium: 143 mmol/L (ref 135–145)

## 2020-10-12 LAB — GLUCOSE, CAPILLARY
Glucose-Capillary: 125 mg/dL — ABNORMAL HIGH (ref 70–99)
Glucose-Capillary: 102 mg/dL — ABNORMAL HIGH (ref 70–99)
Glucose-Capillary: 93 mg/dL (ref 70–99)
Glucose-Capillary: 111 mg/dL — ABNORMAL HIGH (ref 70–99)
Glucose-Capillary: 151 mg/dL — ABNORMAL HIGH (ref 70–99)

## 2020-10-12 LAB — CBC
HCT: 34.3 % — ABNORMAL LOW (ref 39.0–52.0)
Hemoglobin: 11.7 g/dL — ABNORMAL LOW (ref 13.0–17.0)
MCH: 29.3 pg (ref 26.0–34.0)
MCHC: 34.1 g/dL (ref 30.0–36.0)
MCV: 86 fL (ref 80.0–100.0)
Platelets: 202 10*3/uL (ref 150–400)
RBC: 3.99 MIL/uL — ABNORMAL LOW (ref 4.22–5.81)
RDW: 14.1 % (ref 11.5–15.5)
WBC: 5.8 10*3/uL (ref 4.0–10.5)
nRBC: 0 % (ref 0.0–0.2)

## 2020-10-12 LAB — PHOSPHORUS: Phosphorus: 2.9 mg/dL (ref 2.5–4.6)

## 2020-10-12 LAB — HEPARIN LEVEL (UNFRACTIONATED)
Heparin Unfractionated: 0.48 IU/mL (ref 0.30–0.70)
Heparin Unfractionated: 0.4 IU/mL (ref 0.30–0.70)

## 2020-10-12 LAB — LACTIC ACID, PLASMA: Lactic Acid, Venous: 1.6 mmol/L (ref 0.5–1.9)

## 2020-10-12 MED ORDER — OSMOLITE 1.2 CAL PO LIQD: 1000.0000 mL | ORAL | Status: DC

## 2020-10-12 MED ORDER — SODIUM CHLORIDE 0.9 % IV SOLN: 12.5000 mg | Freq: Once | INTRAVENOUS | Status: DC

## 2020-10-12 MED ORDER — SODIUM CHLORIDE 0.9 % IV SOLN
12.5000 mg | Freq: Once | INTRAVENOUS | Status: AC
  Administered 2020-10-12: 12.5 mg via INTRAVENOUS
  Filled 2020-10-12: qty 0.5

## 2020-10-12 MED ORDER — SODIUM CHLORIDE 0.9 % IV SOLN
INTRAVENOUS | Status: DC | PRN
  Administered 2020-10-12: 250 mL via INTRAVENOUS

## 2020-10-12 MED ORDER — OSMOLITE 1.5 CAL PO LIQD
1000.0000 mL | ORAL | Status: DC
  Administered 2020-10-12: 1000 mL
  Filled 2020-10-12: qty 1000

## 2020-10-12 MED ORDER — PROSOURCE TF PO LIQD
45.0000 mL | Freq: Three times a day (TID) | ORAL | Status: DC
  Administered 2020-10-12 – 2020-10-13 (×3): 45 mL
  Filled 2020-10-12 (×3): qty 45

## 2020-10-12 MED ORDER — VITAL HIGH PROTEIN PO LIQD: 1000.0000 mL | ORAL | Status: DC

## 2020-10-12 MED ORDER — FREE WATER
150.0000 mL | Status: DC
  Administered 2020-10-12 – 2020-10-13 (×3): 150 mL

## 2020-10-12 MED ORDER — HYDROMORPHONE HCL 1 MG/ML IJ SOLN: 0.5000 mg | Freq: Once | INTRAMUSCULAR | Status: DC

## 2020-10-12 NOTE — Procedures (Signed)
Cortrak  Person Inserting Tube:  Libia Fazzini C, RD Tube Type:  Cortrak - 43 inches Tube Location:  Left nare Initial Placement:  Stomach Secured by: Bridle Technique Used to Measure Tube Placement:  Documented cm marking at nare/ corner of mouth Cortrak Secured At:  66 cm    Cortrak Tube Team Note:  Consult received to place a Cortrak feeding tube.   No x-ray is required. RN may begin using tube.   If the tube becomes dislodged please keep the tube and contact the Cortrak team at [email or web address] (password [email or web address]) for replacement.  If after hours and replacement cannot be delayed, place a NG tube and confirm placement with an abdominal x-ray.    Tykisha Areola P., RD, LDN, CNSC See AMiON for contact information    

## 2020-10-12 NOTE — Sepsis Progress Note (Signed)
Notified provider of need to order repeat lactic acid. ° °

## 2020-10-12 NOTE — Progress Notes (Signed)
[facility]  PROGRESS NOTE  Mark May HYW:[id number] DOB: November 18, 1956 DOA: 10/11/2020 PCP: Reubin Milan, MD   Brief HPI:   64 year old male with medical history of hypertension, hyperlipidemia, stomach ulcer surgery who presented to [facility] on 10/01/2020 with left-sided weakness, noted to have right MCA occlusion s/p thrombectomy and stent placement on 10/08/2020.  Patient hospitalization was complicated by hypoxic respiratory failure due to pulmonary edema requiring BiPAP and Lasix.  He was awaiting ECF placement however on 10/10/2020 he became more lethargic and tachycardic with tachypnea.  Patient went into A. fib with RVR ,also had a fever.  Due to concern for severe sepsis, PCCM was consulted.  Patient found to have sepsis with right lower lobe pneumonia and likely aspiration.  Started on IV Zosyn.  Also started on amiodarone and heparin gtt.    Subjective   Patient seen and examined, wife at bedside.  Communicated with patient with tele interpreter.  He denies shortness of breath.   Assessment/Plan:     1. Sepsis due to pneumonia-?  Aspiration.  Patient started on IV Zosyn.  PCCM following. 2. Dysphagia-patient is high risk for aspiration, is unable to consume adequate calories.  Speech therapist discussed with patient and his wife, he agrees for core track feeding tube placement for short duration.  Will order core track feeding tube. 3. Paroxysmal atrial fibrillation with RVR-heart rate is controlled, continue heparin GTT, amiodarone infusion. 4. 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 5. Hypertension-Lopressor on hold due to hypotension 6. Acute kidney injury-patient baseline creatinine is around 0.8-1, creatinine has been steadily rising.  Today creatinine is 1.61, likely from prerenal azotemia.  Continue LR at 100 mill per hour.  Follow BMP in am. 7. Diabetes mellitus type 2-CBG well  controlled, continue sliding scale insulin NovoLog.     COVID-19 Labs   Recent Labs    10/11/20 1746  DDIMER 9.78*    Lab Results  Component Value Date   SARSCOV2NAA NEGATIVE 10/07/2020     Scheduled medications:   .  stroke: mapping our early stages of recovery book   Does not apply Once  . atorvastatin  20 mg Oral Daily  . chlorhexidine  15 mL Mouth Rinse BID  . Chlorhexidine Gluconate Cloth  6 each Topical Daily  . feeding supplement (PROSource TF)  45 mL Per Tube TID  . [START ON 10/13/2020] free water  150 mL Per Tube Q4H  . insulin aspart  0-9 Units Subcutaneous Q4H  . mouth rinse  15 mL Mouth Rinse q12n4p  . ticagrelor  90 mg Oral BID   Or  . ticagrelor  90 mg Per Tube BID         CBG: Recent Labs  Lab 10/11/20 1939 10/11/20 2336 10/12/20 0330 10/12/20 0731 10/12/20 1159  GLUCAP 123* 121* 125* 102* 93    SpO2: 98 % O2 Flow Rate (L/min): 6 L/min FiO2 (%): 50 %    CBC: Recent Labs  Lab 10/06/20 0522 10/07/20 0527 10/08/20 0727 10/09/20 0329 10/10/20 0259 10/11/20 1706 10/12/20 0314  WBC 10.7*   < > 8.4 6.5 5.1 5.4 5.8  NEUTROABS 8.2*  --   --   --   --  4.8  --   HGB 13.5   < > 14.6 14.6 13.8 12.4* 11.7*  HCT 40.4   < > 43.7 43.2 41.0 36.7* 34.3*  MCV 89.2   < > 88.8 89.1 88.7  87.0 86.0  PLT 232   < > 212 199 203 218 202   < > = values in this interval not displayed.    Basic Metabolic Panel: Recent Labs  Lab 10/08/20 0727 10/08/20 0727 10/09/20 0329 10/10/20 0259 10/11/20 0915 10/11/20 1706 10/11/20 1746 10/12/20 0314  NA 139   < > 138 137 139 140  --  143  K 3.6   < > 3.6 3.4* 4.1 3.6  --  3.8  CL 101   < > 103 105 111 110  --  113*  CO2 26   < > 24 23 19* 18*  --  19*  GLUCOSE 130*   < > 151* 165* 248* 155*  --  132*  BUN 18   < > 27* 36* 47* 47*  --  42*  CREATININE 1.39*   < > 1.48* 1.69* 1.96* 1.86*  --  1.61*  CALCIUM 7.8*   < > 8.2* 8.1* 7.7* 8.1*  --  8.0*  MG 1.8  --   --   --   --   --  2.3  --   PHOS 4.9*  --    --   --   --  1.6*  --   --    < > = values in this interval not displayed.     Liver Function Tests: Recent Labs  Lab 10/08/20 0727 10/11/20 1706  AST 53* 101*  ALT 38 45*  ALKPHOS 121 96  BILITOT 2.4* 1.3*  PROT 5.9* 5.3*  ALBUMIN 2.6* 1.6*     Antibiotics: Anti-infectives (From admission, onward)   Start     Dose/Rate Route Frequency Ordered Stop   10/12/20 1800  ceFEPIme (MAXIPIME) 2 g in sodium chloride 0.9 % 100 mL IVPB  Status:  Discontinued        2 g 200 mL/hr over 30 Minutes Intravenous Every 24 hours 10/11/20 1718 10/11/20 1742   10/12/20 1800  vancomycin (VANCOREADY) IVPB 750 mg/150 mL  Status:  Discontinued        750 mg 150 mL/hr over 60 Minutes Intravenous Every 24 hours 10/11/20 1724 10/11/20 1758   10/12/20 0200  metroNIDAZOLE (FLAGYL) tablet 500 mg  Status:  Discontinued        500 mg Oral Every 8 hours 10/11/20 1715 10/11/20 1758   10/12/20 0030  piperacillin-tazobactam (ZOSYN) IVPB 3.375 g        3.375 g 12.5 mL/hr over 240 Minutes Intravenous Every 8 hours 10/11/20 1805     10/11/20 1830  piperacillin-tazobactam (ZOSYN) IVPB 3.375 g        3.375 g 100 mL/hr over 30 Minutes Intravenous  Once 10/11/20 1805 10/11/20 1839   10/11/20 1715  ceFEPIme (MAXIPIME) 2 g in sodium chloride 0.9 % 100 mL IVPB  Status:  Discontinued        2 g 200 mL/hr over 30 Minutes Intravenous STAT 10/11/20 1710 10/11/20 1742   10/11/20 1715  metroNIDAZOLE (FLAGYL) IVPB 500 mg  Status:  Discontinued        500 mg 100 mL/hr over 60 Minutes Intravenous STAT 10/11/20 1710 10/11/20 1807   10/11/20 1715  vancomycin (VANCOCIN) IVPB 1000 mg/200 mL premix  Status:  Discontinued        1,000 mg 200 mL/hr over 60 Minutes Intravenous STAT 10/11/20 1710 10/11/20 1804   10/24/2020 1541  ceFAZolin (ANCEF) 2-4 GM/100ML-% IVPB       Note to Pharmacy: Teof[facility]   : cabinet override  10/28/2020 1541 10/06/20 0344       DVT prophylaxis: Heparin     Objective   Vitals:    10/12/20 0500 10/12/20 0617 10/12/20 0818 10/12/20 1234  BP:  111/68    Pulse:  100 97 (!) 106  Resp:  (!) 29    Temp:  99.1 F (37.3 C) 98.9 F (37.2 C) 99.4 F (37.4 C)  TempSrc:  Oral Axillary Axillary  SpO2:  98%    Weight: 56.5 kg       Intake/Output Summary (Last 24 hours) at 10/12/2020 1638 Last data filed at 10/12/2020 0453 Gross per 24 hour  Intake 1265.21 ml  Output 800 ml  Net 465.21 ml    11/10 1901 - 11/12 0700 In: 1605.2 [P.O.:340; I.V.:1110.3] Out: 1600 [Urine:1600]  Filed Weights   10/12/20 0500  Weight: 56.5 kg    Physical Examination:    General:  Appears lethargic  Cardiovascular: S1-S2, regular  Respiratory: Bilateral rhonchi auscultated  Abdomen: Abdomen is soft, nontender, no organomegaly  Extremities: No edema in the lower extremities  Neurologic: Alert, oriented x2, left hemiplegia        Data Reviewed:   Recent Results (from the past 240 hour(s))  Respiratory Panel by RT PCR (Flu A&B, Covid) - Nasopharyngeal Swab     Status: None   Collection Time: 10/03/2020  2:44 PM   Specimen: Nasopharyngeal Swab  Result Value Ref Range Status   SARS Coronavirus 2 by RT PCR NEGATIVE NEGATIVE Final    Comment: (NOTE) SARS-CoV-2 target nucleic acids are NOT DETECTED.  The SARS-CoV-2 RNA is generally detectable in upper respiratoy specimens during the acute phase of infection. The lowest concentration of SARS-CoV-2 viral copies this assay can detect is 131 copies/mL. A negative result does not preclude SARS-Cov-2 infection and should not be used as the sole basis for treatment or other patient management decisions. A negative result may occur with  improper specimen collection/handling, submission of specimen other than nasopharyngeal swab, presence of viral mutation(s) within the areas targeted by this assay, and inadequate number of viral copies (<131 copies/mL). A negative result must be combined with clinical observations, patient  history, and epidemiological information. The expected result is Negative.  Fact Sheet for Patients:  https://www.moore.com/  Fact Sheet for Healthcare Providers:  https://www.young.biz/  This test is no t yet approved or cleared by the [place] FDA and  has been authorized for detection and/or diagnosis of SARS-CoV-2 by FDA under an Emergency Use Authorization (EUA). This EUA will remain  in effect (meaning this test can be used) for the duration of the COVID-19 declaration under Section 564(b)(1) of the Act, 21 U.S.C. section 360bbb-3(b)(1), unless the authorization is terminated or revoked sooner.     Influenza A by PCR NEGATIVE NEGATIVE Final   Influenza B by PCR NEGATIVE NEGATIVE Final    Comment: (NOTE) The Xpert Xpress SARS-CoV-2/FLU/RSV assay is intended as an aid in  the diagnosis of influenza from Nasopharyngeal swab specimens and  should not be used as a sole basis for treatment. Nasal washings and  aspirates are unacceptable for Xpert Xpress SARS-CoV-2/FLU/RSV  testing.  Fact Sheet for Patients: https://www.moore.com/  Fact Sheet for Healthcare Providers: https://www.young.biz/  This test is not yet approved or cleared by the [place] FDA and  has been authorized for detection and/or diagnosis of SARS-CoV-2 by  FDA under an Emergency Use Authorization (EUA). This EUA will remain  in effect (meaning this test can be used) for the duration of the  Covid-19 declaration under Section 564(b)(1) of the Act, 21  U.S.C. section 360bbb-3(b)(1), unless the authorization is  terminated or revoked. Performed at [facility], [address] [place], [place] [address]   MRSA PCR Screening     Status: None   Collection Time: March 04, 2020  6:34 PM   Specimen: Nasal Mucosa; Nasopharyngeal  Result Value Ref Range Status   MRSA by PCR NEGATIVE NEGATIVE Final    Comment:        The  GeneXpert MRSA Assay (FDA approved for NASAL specimens only), is one component of a comprehensive MRSA colonization surveillance program. It is not intended to diagnose MRSA infection nor to guide or monitor treatment for MRSA infections. Performed at [facility], [address] [place], [place] [address]   Culture, Urine     Status: None   Collection Time: 10/10/20  5:13 PM   Specimen: Urine, Catheterized  Result Value Ref Range Status   Specimen Description URINE, CATHETERIZED  Final   Special Requests NONE  Final   Culture   Final    NO GROWTH Performed at [facility], [address] [place], [place] [address]    Report Status 10/11/2020 FINAL  Final  Culture, blood (x 2)     Status: None (Preliminary result)   Collection Time: 10/11/20  7:13 PM   Specimen: BLOOD  Result Value Ref Range Status   Specimen Description BLOOD SITE NOT SPECIFIED  Final   Special Requests   Final    BOTTLES DRAWN AEROBIC AND ANAEROBIC Blood Culture results may not be optimal due to an inadequate volume of blood received in culture bottles   Culture   Final    NO GROWTH < 12 HOURS Performed at [facility], [address] [place], [place] [address]    Report Status PENDING  Incomplete  Culture, blood (x 2)     Status: None (Preliminary result)   Collection Time: 10/11/20  7:15 PM   Specimen: BLOOD  Result Value Ref Range Status   Specimen Description BLOOD SITE NOT SPECIFIED  Final   Special Requests   Final    BOTTLES DRAWN AEROBIC AND ANAEROBIC Blood Culture adequate volume   Culture   Final    NO GROWTH < 12 HOURS Performed at [facility], [address] [place], [place] [address]    Report Status PENDING  Incomplete    No results for input(s): LIPASE, AMYLASE in the last 168 hours. No results for input(s): AMMONIA in the last 168 hours.  Cardiac Enzymes: No results for input(s): CKTOTAL, CKMB, CKMBINDEX, TROPONINI in the last 168 hours. BNP (last 3  results) No results for input(s): BNP in the last 8760 hours.  ProBNP (last 3 results) No results for input(s): PROBNP in the last 8760 hours.  Studies:  US RENAL  Result Date: 10/11/2020 CLINICAL DATA:  Acute renal insufficiency EXAM: RENAL / URINARY TRACT ULTRASOUND COMPLETE COMPARISON:  None. FINDINGS: Right Kidney: Renal measurements: 10.5 x 5.4 x 4.9 cm = volume: 145 mL. Echogenicity within normal limits. No mass or hydronephrosis visualized. 1.8 cm simple exophytic cortical cyst noted. Left Kidney: Renal measurements: 11.4 x 6.2 x 4.8 cm = volume: 176 mL. Echogenicity within normal limits. No mass or hydronephrosis visualized. 2.7 cm exophytic simple cortical cyst noted. Bladder: Decompressed and not well visualized. Other: None. IMPRESSION: Normal renal sonogram Electronically Signed   By: Helyn NumbersAshesh  Parikh MD   On: 10/11/2020 21:01   DG  Chest Port 1 View  Result Date: 10/11/2020 CLINICAL DATA:  Sepsis EXAM: PORTABLE CHEST 1 VIEW COMPARISON:  10/09/2020 FINDINGS: Two frontal views of the chest demonstrate an enlarged cardiac silhouette. Lung volumes are diminished, with new left basilar consolidation. Small bilateral pleural effusions are identified. No pneumothorax. IMPRESSION: 1. New left basilar consolidation which could reflect atelectasis or pneumonia. 2. New small bilateral pleural effusions, left greater than right. Electronically Signed   By: Sharlet Salina M.D.   On: 10/11/2020 17:46   DG Abd Portable 1V  Result Date: 10/10/2020 CLINICAL DATA:  Abdominal distension EXAM: PORTABLE ABDOMEN - 1 VIEW COMPARISON:  10/07/2020 FINDINGS: Nasogastric tube is been removed. Contrast administered during a fluoroscopic examination of levin/8/21 is now seen throughout the colon and rectum which is nondilated. Normal abdominal gas pattern. No gross free intraperitoneal gas. Osseous structures are unremarkable. Previously noted renal calculi are obscured by intraluminal contrast. IMPRESSION: Normal  abdominal gas pattern. Electronically Signed   By: Helyn Numbers MD   On: 10/10/2020 22:26       Meredeth Ide   [facility] If 7PM-7AM, please contact night-coverage at [email or web address], Office  [phone]   10/12/2020, 4:38 PM  LOS: 7 days

## 2020-10-12 NOTE — Progress Notes (Signed)
MEWS is driven by temperature and HR. Notified ELink MD on call, no new order,  to implement  existing order, administer PRN Tylenol

## 2020-10-12 NOTE — Progress Notes (Signed)
Initial Nutrition Assessment  DOCUMENTATION CODES:   Severe malnutrition in context of chronic illness  INTERVENTION:   Initiate Osmolite 1.5 @ 20 ml/hr via cortrak tube and increase by 10 ml every 8 hours to goal rate of 50 ml/hr.   45 ml Prosource TF TID.    150 ml free water flush every 4 hours  Tube feeding regimen provides 1920 kcal (100% of needs), 108 grams of protein, and 914 ml of H2O. Total free water: 1814 ml/ day  -Monitor K, Mg, and Phos daily and replete as needed; pt is at high risk for refeeding syndrome  NUTRITION DIAGNOSIS:   Severe Malnutrition related to chronic illness (stomach ulcer s/p partial gastrectomy) as evidenced by moderate fat depletion, severe fat depletion, moderate muscle depletion, severe muscle depletion.  GOAL:   Patient will meet greater than or equal to 90% of their needs  MONITOR:   Diet advancement, Labs, Weight trends, TF tolerance, Skin, I & O's  REASON FOR ASSESSMENT:   Rounds    ASSESSMENT:   Mark May is a 64 y.o. Asian male with PMH of hypertension, BPH, sciatica, stomach ulcer status post surgery in the past presented to ER for acute onset left-sided weakness, left facial droop and left hemianopia.  Pt admitted with rt MCA stroke.   11/5- s/p mechanical thrombectomy of right MCA and stent 11/8- s/p BSE- recommend continued NPO 11/9- s/p BSE- advanced to dysphagia 3 diet with nectar thick liquids 11/12- cortrak tube placed, tip of tube confirmed in stomach  Reviewed I/O's: -115 ml x 24 hours and -1.1 L since admission  UOP: 1.6 L x 24 hours  Pt seen with SLP Mark May). Per SLP, pt is very lethargic and recommending NPO at this time, with the exception of oral care and thickened liquids for comfort. There is concern about pt's mentation and ability to be able to consume adequate nutrition even if he is advanced to a PO diet.   Pt previously on dysphagia 3 diet with nectar thick liquids. Noted meal intake PO 0-50%.    Spoke with pt and pt wife via [employer] Interpreter Mark May [phone]). Reviewed with SLP recommendations for NPO and placement of temporary NGT (cortrak). Pt initially hesitant to place NGT due to past experience, however, RD and SLP explained cortrak feeding tube placement process. Pt agreeable to placement with encouragement of wife.   Of note, per discussion with interpreter, pt speaks a dialect [facility]); recommending use of phone interpreter to best communicate with pt.   Medications reviewed and include amiodarone and lactated ringers infusion @ 100 ml/hr.   Labs reviewed: CBGS: 93-111 (inpatient orders for glycemic control are 0-9 units insulin aspart every 4 hours).   NUTRITION - FOCUSED PHYSICAL EXAM:    Most Recent Value  Orbital Region Moderate depletion  Upper Arm Region Moderate depletion  Thoracic and Lumbar Region Moderate depletion  Buccal Region Severe depletion  Temple Region Severe depletion  Clavicle Bone Region Severe depletion  Clavicle and Acromion Bone Region Severe depletion  Scapular Bone Region Severe depletion  Dorsal Hand Moderate depletion  Patellar Region Moderate depletion  Anterior Thigh Region Moderate depletion  Posterior Calf Region Moderate depletion  Edema (RD Assessment) Mild  Hair Reviewed  Eyes Reviewed  Mouth Reviewed  Skin Reviewed  Nails Reviewed       Diet Order:   Diet Order            Diet NPO time specified Except for: Other (See Comments)  Diet effective  now                 EDUCATION NEEDS:   Education needs have been addressed  Skin:  Skin Assessment: Reviewed RN Assessment  Last BM:  10/11/20  Height:   Ht Readings from Last 1 Encounters:  10/27/2020 5\' 7"  (1.702 m)    Weight:   Wt Readings from Last 1 Encounters:  10/12/20 56.5 kg    Ideal Body Weight:  67.3 kg  BMI:  Body mass index is 19.51 kg/m.  Estimated Nutritional Needs:   Kcal:  1800-2000  Protein:  100-115 grams  Fluid:  > 1.8  L    13/12/21, RD, LDN, CDCES Registered Dietitian II Certified Diabetes Care and Education Specialist Please refer to [facility] for RD and/or RD on-call/weekend/after hours pager

## 2020-10-12 NOTE — Progress Notes (Signed)
eLink Physician-Brief Progress Note Patient Name: Mark May DOB: 02-Dec-1955 MRN: [id number]   Date of Service  10/12/2020  HPI/Events of Note  Patient cannot receive is scheduled Brilinta because he is NPO, he is on a heparin drip.  eICU Interventions  Patient will need a Cortrack feeding tube placed in the a.m.        Kaylynne Andres U Shyne Resch 10/12/2020, 1:16 AM

## 2020-10-12 NOTE — Progress Notes (Addendum)
ANTICOAGULATION CONSULT NOTE   Pharmacy Consult for Heparin Indication: afib, s/p CVA  No Known Allergies  Patient Measurements:   Heparin Dosing Weight:  56.7 kg  Vital Signs: Temp: 97.9 F (36.6 C) (11/12 0348) Temp Source: Oral (11/12 0348) BP: 124/72 (11/12 0348) Pulse Rate: 92 (11/12 0348)  Labs: Recent Labs    10/09/20 2202 10/10/20 0259 10/10/20 0259 10/11/20 0915 10/11/20 1706 10/11/20 1746 10/11/20 2107 10/12/20 0314  HGB  --  13.8   < >  --  12.4*  --   --  11.7*  HCT  --  41.0  --   --  36.7*  --   --  34.3*  PLT  --  203  --   --  218  --   --  202  APTT  --   --   --   --  41*  --   --   --   LABPROT  --   --   --   --  13.7  --   --   --   INR  --   --   --   --  1.1  --   --   --   HEPARINUNFRC  --   --   --   --   --   --   --  0.48  CREATININE  --  1.69*  --  1.96* 1.86*  --   --   --   TROPONINIHS 6  --   --   --   --  43* 35*  --    < > = values in this interval not displayed.    Estimated Creatinine Clearance: 31.8 mL/min (A) (by C-G formula based on SCr of 1.86 mg/dL (H)).  Assessment: CC/HPI: 14 YOM who presented on 11/5 with R MCA stroke s/p IR + stent placement for reocclusion   PMH: gout, HTN, BPH, sciatica, stomach ulcer s/p surgery,   Anticoag: CVA s/p thrombectomy, stent placement. New onset afib.  On IV heparin. Initial 6 hour heparin level was therapeutic (0.48) on gtt at 800 units/hr. Confirmatory 6 hr heparin level remains therapeutic at 0.4 on same heparin rate.  No bleeding noted H/H 11.7/34.3, pltc 202k stable  Goal of Therapy:  Heparin level 0.3-0.5 units/ml Monitor platelets by anticoagulation protocol: Yes    Plan:  Continue heparin at 800 units/hr Daily Heparin level and CBC  Noah Delaine, RPh Clinical Pharmacist Phone [phone] Please see amion for complete clinical pharmacist phone list 10/12/2020,3:55 AM

## 2020-10-12 NOTE — Progress Notes (Signed)
STROKE TEAM PROGRESS NOTE   INTERVAL HISTORY Wife at bedside. Discussed with her with interpreter, answered all questions.  OBJECTIVE Vitals:   10/12/20 0500 10/12/20 0617 10/12/20 0818 10/12/20 1234  BP:  111/68    Pulse:  100 97 (!) 106  Resp:  (!) 29    Temp:  99.1 F (37.3 C) 98.9 F (37.2 C) 99.4 F (37.4 C)  TempSrc:  Oral Axillary Axillary  SpO2:  98%    Weight: 56.5 kg      CBC:  Recent Labs  Lab 10/06/20 0522 10/07/20 0527 10/11/20 1706 10/12/20 0314  WBC 10.7*   < > 5.4 5.8  NEUTROABS 8.2*  --  4.8  --   HGB 13.5   < > 12.4* 11.7*  HCT 40.4   < > 36.7* 34.3*  MCV 89.2   < > 87.0 86.0  PLT 232   < > 218 202   < > = values in this interval not displayed.   Basic Metabolic Panel:  Recent Labs  Lab 10/08/20 0727 10/09/20 0329 10/11/20 1706 10/11/20 1746 10/12/20 0314  NA 139   < > 140  --  143  K 3.6   < > 3.6  --  3.8  CL 101   < > 110  --  113*  CO2 26   < > 18*  --  19*  GLUCOSE 130*   < > 155*  --  132*  BUN 18   < > 47*  --  42*  CREATININE 1.39*   < > 1.86*  --  1.61*  CALCIUM 7.8*   < > 8.1*  --  8.0*  MG 1.8  --   --  2.3  --   PHOS 4.9*  --  1.6*  --   --    < > = values in this interval not displayed.   Lipid Panel:     Component Value Date/Time   CHOL 173 10/06/2020 0522   CHOL 216 (H) 03/09/2018 1620   TRIG 396 (H) 10/07/2020 0527   HDL 33 (L) 10/06/2020 0522   HDL 59 03/09/2018 1620   CHOLHDL 5.2 10/06/2020 0522   VLDL 55 (H) 10/06/2020 0522   LDLCALC 85 10/06/2020 0522   LDLCALC 136 (H) 03/09/2018 1620   HgbA1c:  Lab Results  Component Value Date   HGBA1C 5.8 (H) 10/06/2020   Urine Drug Screen: No results found for: LABOPIA, COCAINSCRNUR, LABBENZ, AMPHETMU, THCU, LABBARB  Alcohol Level No results found for: [facility]  IMAGING  CT HEAD WO CONTRAST 10/19/2020 1. No acute abnormality.  2. Minimal diffuse cerebral and cerebellar atrophy.  3. Mild chronic small vessel white matter ischemic changes in both cerebral hemispheres.    CT Angio Head W or Wo Contrast CT Angio Neck W and/or Wo Contrast CT CEREBRAL PERFUSION W CONTRAST 10/07/2020 1. 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  2. Right MCA infarct with extensive penumbra.  3. 4 mm right supraclinoid ICA aneurysm.  4. Widely patent cervical carotid and vertebral arteries.  5. Aortic Atherosclerosis (ICD10-I70.0).   CT HEAD WO CONTRAST 10/03/2020 Hypodensity in the right lateral temporal lobe now shows mild hemorrhage or contrast enhancement. This is most consistent with acute infarct. There has been interval stenting of the right middle cerebral artery.   CT HEAD WO CONTRAST 10/20/2020 Right temporal lobe hypodensity compatible with acute infarct. Decreased hyperdensity within this stroke compared with 4 hours previously compatible with contrast staining rather than hemorrhage. Small area of  acute infarct in the right parietal lobe now evident. Right MCA stent.   MRI brain: 10/06/20 1. Fairly extensive patchy acute/early subacute infarcts within the right MCA/watershed territory.  2. Additional small acute/early subacute infarcts within the dorsal right thalamus and left basal ganglia. 3. Background moderate chronic small vessel ischemic disease.  MRA head: 10/06/20 1. Interval stenting of the M1/M2 right middle cerebral artery. Flow related signal is present proximal and distal to the stent suggestive of stent patency. 2. 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  DG Abd 1 View 10/02/2020 Gastric catheter within the stomach kinked at the proximal side port. This could be withdrawn slightly S necessary.   Transthoracic Echocardiogram  10/06/2020 1. Left ventricular ejection fraction, by estimation, is 70 to 75%. The left ventricle has hyperdynamic function. The left ventricle has no regional wall motion abnormalities. Left ventricular diastolic parameters are consistent with Grade II diastolic  dysfunction (pseudonormalization). Elevated left atrial pressure.  2. Right ventricular systolic function is normal. The right ventricular size is normal.  3. The mitral valve is normal in structure. Mild mitral valve regurgitation. No evidence of mitral stenosis.  4. The aortic valve is normal in structure. Aortic valve regurgitation is not visualized. No aortic stenosis is present.  5. The inferior vena cava is dilated in size with <50% respiratory variability, suggesting right atrial pressure of 15 mmHg.  ECG - SR rate 70 BPM. (See cardiology reading for complete details)   PHYSICAL EXAM   Blood pressure 111/68, pulse (!) 106, temperature 99.4 F (37.4 C), temperature source Axillary, resp. rate (!) 29, weight 56.5 kg, SpO2 98 %.  Frail elderly [place] male. Afebrile. Head is nontraumatic. Neck is supple without bruit.    Cardiac exam no murmur or gallop. Lungs are course with rhonchi and decreased breath sounds at bases to auscultation. Distal pulses are well felt. Neurological Exam : He is awake and appears and follows few simple midline commands on the right. PERRL, grimaces to stim, opens eyes spontaneously, conjugate midline gaze right preference,blinks on the right not on the left ,left lower facial weakness.  Purposeful and spontaneous movements on the right. , no movement left arm.  Does withdraw left legs to pain.  Gait deferred.     ASSESSMENT/PLAN Mr. Mark May is a 64 y.o. male with history of hypertension, BPH, sciatica, stomach ulcer status post surgery in the past presented to ER at [facility] for acute onset left-sided weakness, left facial droop and left hemianopia.  CT head and neck showed right M1 high-grade stenosis versus near occlusive thrombosis.  CT perfusion showed large penumbra.  Patient transferred to [facility] for thrombectomy.  He did not receive IV t-PA due to late presentation (>4.5 hours from time of onset). IR - complete occlusion of RT  MCA M 1 seg - thrombectomy ->TICI 3 revascularization with placement of rescue stent m4 mm x 24 mm Atlas for reocclusion with restoration of TICI 3 .  Stroke: Rt lateral temporal infarct s/p IR TICI3 R M1 w/ stent placement - thromboembolic d/t large vessel disease  CT Head - No acute abnormality. Minimal diffuse cerebral and cerebellar atrophy. Mild chronic small vessel white matter ischemic changes in both cerebral hemispheres.   CTA H&N - Severe distal right M1 stenosis or subocclusive thrombus. 4 mm right supraclinoid ICA aneurysm. Widely patent cervical carotid and vertebral arteries.   CT Perfusion - Right MCA infarct with extensive penumbra.   CT head -  Hypodensity in the right lateral temporal lobe now shows mild hemorrhage or contrast enhancement. This is most consistent with acute infarct. There has been interval stenting of the right middle cerebral artery.   CT Head - Right temporal lobe hypodensity compatible with acute infarct. Decreased hyperdensity within this stroke compared with 4 hours previously compatible with contrast staining rather than hemorrhage. Small area of acute infarct in the right parietal lobe now evident. Right MCA stent.  MRI head - Fairly extensive patchy acute/early subacute infarcts within the right MCA/watershed territory. Additional small acute/early subacute infarcts within the dorsal right thalamus and left basal ganglia.  MRA head -  Interval stenting of the M1/M2 right middle cerebral artery. Flow related signal is present proximal and distal to the stent suggestive of stent patency. 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  2D Echo - EF 70 - 75%. No cardiac source of emboli identified.   UE doppler neg DVT  Mark JacobsonSars Corona Virus 2 - negative  LDL - 85  HgbA1c - 5.8  VTE prophylaxis - SCDs  No antithrombotic prior to admission, treated w/ Cangrelor. now on Brilinta (ticagrelor) 90 mg bid and heparin IV     Therapy recommendations:  CIR  Disposition:  Pending  Medically not ready for d/c at this time  Atrial Fibrillation w/ RVR, new onset 11/11  CHA2DS2-VASc Score = at least 5,  ?2 oral anticoagulation recommended  Age in Years:  3665-74   +1    Sex:  Male   0    Hypertension History:  yes   +1     Diabetes Mellitus:  0  Congestive Heart Failure History:  yes   +1   Vascular Disease History:  0     Stroke/TIA/Thromboembolism History:  yes   +2 . On amio gtt . On IV heparin   Sepsis d/t RLL PNA, likely aspiration  CXR tomorrow   Tmax 102.6  WBC normal  Zosyn 11/11>>  (plan 7 days tx)   Blood Cx NGTD  D-Dimer 9.78  Lactic acid 3.1->1.6    Trop 6...43->35  AKI  Cre - 1.48-1.69-1.96->1.61   Stopped lisinopril.   On IVF  Avoid nephrotoxins  Monitor UOP, at risk for fluid overload  Acute hypoxic respiratory failure, due to pulmonary edema, requiring BiPAP  Recurrent intubation for agitation, respiratory distress, hypoxia  Off temporary bipap    tachypnea    Acute diastolic heart failure with pulmonary edema  Improved after lasix   temporary bipap at that time   Hypertension Tachycardia  Home BP meds: Amlodipine  off Cleviprex  Stable . BP goal <180 . Stopped lisinopril d/t rising Cre. Added metoprolol . Long-term BP goal normotensive  Hyperlipidemia  Home Lipid lowering medication: none  LDL 85, goal < 70  Current lipid lowering medication when has po access: added Lipitor 20 (not high intensity as LDL almost at goal)  Continue statin at discharge  Dysphagia . Secondary to stroke . MBSS previously cleared for D3 nectar . SLP reassess . Ok for sips nectar thick liquids  . Cortrak placed for Brilinta administration w/ TF 11/12 . Speech on board   Other Stroke Risk Factors  Advanced age  Other Active Problems  Pt speaks Madarin    Aortic Atherosclerosis (ICD10-I70.0).   4 mm right supraclinoid ICA aneurysm.  Hypokalemia -  3.0 - supplement - recheck 3.6 -3.4-4.1 - resolved  Olecranon bursitis, left    TRH consulted to assist with medical management->consulted CCM  Dr. Katrinka Blazing who is now on board  Hospital day # 7   Personally examined patient and images, and have participated in and made any corrections needed to history, physical, neuro exam,assessment and plan as stated above.  I have personally obtained the history, evaluated lab date, reviewed imaging studies and agree with radiology interpretations.    Naomie Dean, MD Stroke Neurology  I spent 35 minutes of face-to-face and non-face-to-face time with patient. This included prechart review, lab review, study review, order entry, electronic health record documentation, patient education on the different diagnostic and therapeutic options, counseling and coordination of care, risks and benefits of management, compliance, or risk factor reduction    to contact Stroke Continuity provider, please refer to [email or web address]. After hours, contact General Neurology

## 2020-10-12 NOTE — Progress Notes (Signed)
Inpatient Rehabilitation-Admissions Coordinator   [facility] continues to follow for insurance determination as well as medical readiness. Will follow up next week.   Cheri Rous, OTR/L  Rehab Admissions Coordinator  [phone] 10/12/2020 12:13 PM

## 2020-10-12 NOTE — Progress Notes (Signed)
NAME:  Mark May, MRN:  [id number], DOB:  1956/02/15, LOS: 7 ADMISSION DATE:  2020/10/31, CONSULTATION DATE: 10-31-2020 REFERRING MD: Dr. Curtis Sites, CHIEF COMPLAINT: Left-sided weakness  Brief History   64 year old male with hypertension and hyperlipidemia who presented with left-sided weakness, noted to have right MCA occlusion status post thrombectomy and stent placement 11/5.  Hospitalization complicated by hypoxic respiratory failure secondary to pulmonary edema requiring BiPAP and lasix.  Awaiting CIR placement however on the evening of 10/10, he was noted to become more lethargic with tachycardia and tachypnea.  Had complained of abdominal pain with some distention and chest pain which resolved after burping.  KUB obtained which was normal.  On 11/11, some what more responsive but now with increasing sCr in which lisinopril was stopped and development of fever 102.6  TRH initially consulted for help with medical management however on their evaluation, PCCM consulted given ill appearance and developing hypotension and new onset Afib with RVR.  Past Medical History  Hypertension Peptic ulcer disease Gout BPH Sciatica   Significant Hospital Events   11/5 Mechanical thrombectomy of right MCA and stent 11/5 11/11 PCCM reconsulted   Consults:  IR Neurology PCCM 11/5- 11/8; 11/11 TRH  Procedures:  11/6 right femoral sheath was removed  Significant Diagnostic Tests:  11/5 CT head: No acute abnormality. Minimal diffuse cerebral and cerebellar atrophy. Mild chronic small vessel white matter ischemic changes in both cerebral hemispheres  11/5 CTA head and neck: Severe distal right M1 stenosis or subocclusive thrombus, Right MCA infarct with extensive penumbra, 4 mm right supraclinoid ICA aneurysm. Widely patent cervical carotid and vertebral arteries.  11/5 postprocedure CT head: Hypodensity in the right lateral temporal lobe now shows mild hemorrhage or contrast enhancement. This is  most consistent with acute infarct. There has been interval stenting of the right middle cerebral artery.  11/6 MRI brain: 1. Fairly extensive patchy acute/early subacute infarcts within the right MCA/watershed territory.  2. Additional small acute/early subacute infarcts within the dorsal right thalamus and left basal ganglia. 3. Background moderate chronic small vessel ischemic disease.  11/6  MRA head: 1. Interval stenting of the M1/M2 right middle cerebral artery. Flow related signal is present proximal and distal to the stent suggestive of stent patency. 2. 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  11/5 DG Abd 1 View Gastric catheter within the stomach kinked at the proximal side port. This could be withdrawn slightly S necessary.   11/6 Transthoracic Echocardiogram  1. Left ventricular ejection fraction, by estimation, is 70 to 75%. The left ventricle has hyperdynamic function. The left ventricle has no regional wall motion abnormalities. Left ventricular diastolic parameters are consistent with Grade II diastolic dysfunction (pseudonormalization). Elevated left atrial pressure.  2. Right ventricular systolic function is normal. The right ventricular size is normal.  3. The mitral valve is normal in structure. Mild mitral valve regurgitation. No evidence of mitral stenosis.  4. The aortic valve is normal in structure. Aortic valve regurgitation is not visualized. No aortic stenosis is present.  5. The inferior vena cava is dilated in size with <50% respiratory variability, suggesting right atrial pressure of 15 mmHg.  11/10 KUB >> normal gas pattern  Micro Data:  11/5 MRSA PCR negative 11/10 UC >> 11/11 BCx2 >>  Antimicrobials:  11/5 cefazolin 11/11 Zosyn >  Interim history/subjective:  Somnolent this am. Making urine. Spoke with wife at bedside using interpreter line.  Objective   Blood pressure 111/68, pulse 97, temperature  98.9 F (37.2 C), temperature source Axillary, resp. rate (!) 29, weight 56.5 kg, SpO2 98 %.    FiO2 (%):  [50 %] 50 %   Intake/Output Summary (Last 24 hours) at 10/12/2020 1041 Last data filed at 10/12/2020 0453 Gross per 24 hour  Intake 1365.21 ml  Output 1600 ml  Net -234.79 ml   Filed Weights   10/12/20 0500  Weight: 56.5 kg    Examination:   Constitutional: ill appearing man in NAD Eyes: eyes are anicteric, reactive to light Ears, nose, mouth, and throat: mucous membranes moist, trachea midline Cardiovascular: heart sounds are regular (converted to sinus on monitor with amio gtt), ext are warm to touch. no edema Respiratory: shallow inspiratory efforts, mildly tachypneic Gastrointestinal: abdomen is soft with + BS Skin: No rashes, normal turgor Neurologic: opens eyes and tracks to voice, dense L hemiplegia Psychiatric: RASS -1  Cr improved Pct elevated CBC benign CXR left basilar PNA vs. atelectasis MBSS reviewed  Resolved Hospital Problem list    Assessment & Plan:   Sepsis due to right lower lobe pneumonia likely aspiration Acute kidney injury- improved with IVF Metabolic encephalopathy- related to sepsis and increased metabolic demand Paroxysmal atrial fibrillation with rapid ventricular response 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 and right MCA stent placement by IR with TICI 3- persistent left hemiplegia Acute diastolic congestive heart failure Hypertension Hyperlipidemia  - One more day IVF, watch resp status, check AM CXR - Amio/heparin gtt for now - Zosyn x 7 days seems reasonable - Would like SLP to re eval, not sure he is safe to swallow and would consider cortrak - Try to keep awake during day, asleep at night - Avoid sedating agents - Avoid nephrotoxins, watch Uop, at risk for volume overload - Will follow with you  Myrla Halsted MD PCCM

## 2020-10-12 NOTE — Progress Notes (Signed)
eLink Physician-Brief Progress Note Patient Name: Mark May DOB: [birth year] MRN: [id number]   Date of Service  10/12/2020  HPI/Events of Note  Patient appears somnolent, no indication of distress secondary to pain, his next dose of Tylenol is due in just over 30 minutes. Per bedside RN the request for additional medication was made by patient's spouse.  eICU Interventions  Will differ additional potentially sedating pain medications, bedside RN instructed to give him his next due dose of Tylenol on schedule.        Thomasene Lot [name] 10/12/2020, 3:46 AM

## 2020-10-12 NOTE — Progress Notes (Signed)
  Speech Language Pathology Treatment: Dysphagia  Patient Details Name: Mark May MRN: [id number] DOB: 1956/10/06 Today's Date: 10/12/2020 Time: 1300-1330 SLP Time Calculation (min) (ACUTE ONLY): 30 min  Assessment / Plan / Recommendation Clinical Impression  Pt observed to be alert but weaker this session. He reported severe dry mouth and oral care was provided with thorough tooth brushing. Pt still reluctant to eat yogurt offered by his wife or teaspoons of hot tea which he typically accepts. Increased verbal cueing needed and still only minimal amount accepted. Encouraged pt and wife to consider a Cortrak for alternate method of feeding given that he does not have the endurance for oral intake. Pt initially refused, but after reasoning given and hope for a brief duration of placement with ability to remove per his wishes, pt agreed. Session provided with Mandarin interpreter who reported pt needs an interpreter fluent in the [place] dialect, which is only available via the telephone interpreter line.  Wife was encouraged to feel comfortable brushing pts teeth and offering teaspoons of nectar thick tea or water per his wishes given that he found NPO status very distressing. Will f/u next week for progress.    HPI HPI: Mr. Mark May is a 64 y.o. male with history of hypertension, BPH, sciatica, stomach ulcer status post surgery in the past presented to ER at [facility] for acute onset left-sided weakness, left facial droop and left hemianopia. CT head and neck showed right M1 high-grade stenosis versus near occlusive thrombosis. CT perfusion showed large penumbra. Patient transferred to [facility] for thrombectomy. Pt intubated 11/5-11/7 with post-extubation respiratory distress.      SLP Plan  Continue with current plan of care       Recommendations  Diet recommendations: Other(comment) (sips of nectar thick tea or water ok) Liquids provided via:  Cup;Teaspoon;Straw Medication Administration: Via alternative means                Follow up Recommendations: 24 hour supervision/assistance SLP Visit Diagnosis: Dysphagia, pharyngeal phase (R13.13) Plan: Continue with current plan of care       GO                Shyia Fillingim, Riley Nearing 10/12/2020, 2:15 PM

## 2020-10-12 NOTE — Progress Notes (Signed)
eLink Physician-Brief Progress Note Patient Name: Mark May DOB: [birth year] MRN: [id number]   Date of Service  10/12/2020  HPI/Events of Note  Patient with persistent hiccups.  eICU Interventions  Chlorpromazine 12.5 mg iv x 1        Torianna Junio U Yaakov Saindon 10/12/2020, 2:14 AM

## 2020-10-13 ENCOUNTER — Inpatient Hospital Stay (HOSPITAL_COMMUNITY): Payer: 59

## 2020-10-13 ENCOUNTER — Inpatient Hospital Stay: Payer: Self-pay

## 2020-10-13 DIAGNOSIS — E43 Unspecified severe protein-calorie malnutrition: Secondary | ICD-10-CM | POA: Insufficient documentation

## 2020-10-13 DIAGNOSIS — I10 Essential (primary) hypertension: Secondary | ICD-10-CM | POA: Diagnosis not present

## 2020-10-13 DIAGNOSIS — I63131 Cerebral infarction due to embolism of right carotid artery: Secondary | ICD-10-CM | POA: Diagnosis not present

## 2020-10-13 DIAGNOSIS — N179 Acute kidney failure, unspecified: Secondary | ICD-10-CM | POA: Diagnosis not present

## 2020-10-13 DIAGNOSIS — I4891 Unspecified atrial fibrillation: Secondary | ICD-10-CM | POA: Diagnosis not present

## 2020-10-13 DIAGNOSIS — I6601 Occlusion and stenosis of right middle cerebral artery: Secondary | ICD-10-CM | POA: Diagnosis not present

## 2020-10-13 LAB — GLUCOSE, CAPILLARY
Glucose-Capillary: 105 mg/dL — ABNORMAL HIGH (ref 70–99)
Glucose-Capillary: 144 mg/dL — ABNORMAL HIGH (ref 70–99)
Glucose-Capillary: 144 mg/dL — ABNORMAL HIGH (ref 70–99)
Glucose-Capillary: 149 mg/dL — ABNORMAL HIGH (ref 70–99)
Glucose-Capillary: 152 mg/dL — ABNORMAL HIGH (ref 70–99)
Glucose-Capillary: 160 mg/dL — ABNORMAL HIGH (ref 70–99)
Glucose-Capillary: 169 mg/dL — ABNORMAL HIGH (ref 70–99)
Glucose-Capillary: 191 mg/dL — ABNORMAL HIGH (ref 70–99)

## 2020-10-13 LAB — BASIC METABOLIC PANEL
Anion gap: 12 (ref 5–15)
BUN: 43 mg/dL — ABNORMAL HIGH (ref 8–23)
CO2: 20 mmol/L — ABNORMAL LOW (ref 22–32)
Calcium: 7.5 mg/dL — ABNORMAL LOW (ref 8.9–10.3)
Chloride: 111 mmol/L (ref 98–111)
Creatinine, Ser: 1.4 mg/dL — ABNORMAL HIGH (ref 0.61–1.24)
GFR, Estimated: 56 mL/min — ABNORMAL LOW (ref >60)
Glucose, Bld: 194 mg/dL — ABNORMAL HIGH (ref 70–99)
Potassium: 3.1 mmol/L — ABNORMAL LOW (ref 3.5–5.1)
Sodium: 143 mmol/L (ref 135–145)

## 2020-10-13 LAB — CBC
HCT: 32.4 % — ABNORMAL LOW (ref 39.0–52.0)
Hemoglobin: 11.4 g/dL — ABNORMAL LOW (ref 13.0–17.0)
MCH: 29.8 pg (ref 26.0–34.0)
MCHC: 35.2 g/dL (ref 30.0–36.0)
MCV: 84.8 fL (ref 80.0–100.0)
Platelets: 222 10*3/uL (ref 150–400)
RBC: 3.82 MIL/uL — ABNORMAL LOW (ref 4.22–5.81)
RDW: 14.2 % (ref 11.5–15.5)
WBC: 9.4 10*3/uL (ref 4.0–10.5)
nRBC: 0 % (ref 0.0–0.2)

## 2020-10-13 LAB — MAGNESIUM
Magnesium: 2.1 mg/dL (ref 1.7–2.4)
Magnesium: 2.1 mg/dL (ref 1.7–2.4)

## 2020-10-13 LAB — PHOSPHORUS
Phosphorus: 2.6 mg/dL (ref 2.5–4.6)
Phosphorus: 2.6 mg/dL (ref 2.5–4.6)

## 2020-10-13 LAB — HEPATIC FUNCTION PANEL
ALT: 60 U/L — ABNORMAL HIGH (ref 0–44)
AST: 105 U/L — ABNORMAL HIGH (ref 15–41)
Albumin: 1.4 g/dL — ABNORMAL LOW (ref 3.5–5.0)
Alkaline Phosphatase: 163 U/L — ABNORMAL HIGH (ref 38–126)
Bilirubin, Direct: 0.3 mg/dL — ABNORMAL HIGH (ref 0.0–0.2)
Indirect Bilirubin: 0.5 mg/dL (ref 0.3–0.9)
Total Bilirubin: 0.8 mg/dL (ref 0.3–1.2)
Total Protein: 5 g/dL — ABNORMAL LOW (ref 6.5–8.1)

## 2020-10-13 LAB — CK: Total CK: 991 U/L — ABNORMAL HIGH (ref 49–397)

## 2020-10-13 LAB — HEPARIN LEVEL (UNFRACTIONATED): Heparin Unfractionated: 0.45 IU/mL (ref 0.30–0.70)

## 2020-10-13 LAB — LACTIC ACID, PLASMA: Lactic Acid, Venous: 1.8 mmol/L (ref 0.5–1.9)

## 2020-10-13 MED ORDER — RESOURCE THICKENUP CLEAR PO POWD
ORAL | Status: DC | PRN
  Filled 2020-10-13: qty 125

## 2020-10-13 MED ORDER — VANCOMYCIN HCL 500 MG/100ML IV SOLN
500.0000 mg | INTRAVENOUS | Status: DC
  Administered 2020-10-14 – 2020-10-15 (×2): 500 mg via INTRAVENOUS
  Filled 2020-10-13 (×2): qty 100

## 2020-10-13 MED ORDER — POTASSIUM CHLORIDE 10 MEQ/100ML IV SOLN
10.0000 meq | INTRAVENOUS | Status: AC
  Administered 2020-10-13 (×4): 10 meq via INTRAVENOUS
  Filled 2020-10-13 (×5): qty 100

## 2020-10-13 MED ORDER — AMIODARONE HCL 200 MG PO TABS: 200.0000 mg | ORAL_TABLET | Freq: Every day | ORAL | Status: DC

## 2020-10-13 MED ORDER — ATORVASTATIN CALCIUM 10 MG PO TABS
20.0000 mg | ORAL_TABLET | Freq: Every day | ORAL | Status: DC
  Administered 2020-10-13: 20 mg
  Filled 2020-10-13: qty 2

## 2020-10-13 MED ORDER — FLEET ENEMA 7-19 GM/118ML RE ENEM
1.0000 | ENEMA | Freq: Once | RECTAL | Status: DC
  Filled 2020-10-13: qty 1

## 2020-10-13 MED ORDER — AMIODARONE HCL 200 MG PO TABS: 200.0000 mg | ORAL_TABLET | Freq: Two times a day (BID) | ORAL | Status: DC

## 2020-10-13 MED ORDER — SENNOSIDES-DOCUSATE SODIUM 8.6-50 MG PO TABS: 1.0000 | ORAL_TABLET | Freq: Every evening | ORAL | Status: DC | PRN

## 2020-10-13 MED ORDER — VANCOMYCIN HCL 750 MG/150ML IV SOLN
750.0000 mg | Freq: Once | INTRAVENOUS | Status: AC
  Administered 2020-10-13: 750 mg via INTRAVENOUS
  Filled 2020-10-13: qty 150

## 2020-10-13 MED ORDER — FUROSEMIDE 10 MG/ML IJ SOLN: 40.0000 mg | Freq: Once | INTRAMUSCULAR | Status: DC

## 2020-10-13 MED ORDER — AMIODARONE HCL 200 MG PO TABS
200.0000 mg | ORAL_TABLET | Freq: Two times a day (BID) | ORAL | Status: DC
  Administered 2020-10-13 – 2020-10-17 (×9): 200 mg
  Filled 2020-10-13 (×9): qty 1

## 2020-10-13 MED ORDER — IOHEXOL 9 MG/ML PO SOLN
500.0000 mL | ORAL | Status: AC
  Administered 2020-10-13 (×2): 125 mL via ORAL

## 2020-10-13 NOTE — Progress Notes (Addendum)
Secure chat with Dr Adaline Sill re PICC order for poor access.  Febrile 10/12/20 and blood cultures pending.  New order to wait until blood cultures negative x 48 Hours.  Minerva Areola RN notified of conversation.  States the 2 PIV are currently functioning well at this time.  New order to d/c PICC order - will reevaluate need prn.

## 2020-10-13 NOTE — Progress Notes (Signed)
Patient remains tachycardic with increased RR, but is now attempting to rest. Still grimaces/groan slightly due to pain on R. Lower side of back. Patient is soothed with therapeutic touch. Will continue to monitor.     10/13/20 0406  Assess: MEWS Score  Temp 97.9 F (36.6 C)  BP (!) 150/73  Pulse Rate (!) 106  ECG Heart Rate (!) 108  Resp (!) 38  Level of Consciousness Alert  SpO2 98 %  O2 Device Room Air  Patient Activity (if Appropriate) In bed  O2 Flow Rate (L/min) 6 L/min  Assess: MEWS Score  MEWS Temp 0  MEWS Systolic 0  MEWS Pulse 1  MEWS RR 3  MEWS LOC 0  MEWS Score 4  MEWS Score Color Red  Assess: if the MEWS score is Yellow or Red  Were vital signs taken at a resting state? Yes  Focused Assessment No change from prior assessment  Early Detection of Sepsis Score *See Row Information* High  MEWS guidelines implemented *See Row Information* No, previously red, continue vital signs every 4 hours (No change)  Treat  Pain Scale Faces  Faces Pain Scale 4  Pain Type Acute pain  Pain Location Back  Pain Orientation Right  Breathing 1  Negative Vocalization 1  Facial Expression 1  Body Language 0  Consolability 1  PAINAD Score 4  Notify: Charge Nurse/RN  Name of Charge Nurse/RN Notified Philomena, RN  Date Charge Nurse/RN Notified 10/13/20  Time Charge Nurse/RN Notified [phone]

## 2020-10-13 NOTE — Progress Notes (Signed)
ANTICOAGULATION CONSULT NOTE   Pharmacy Consult for Heparin Indication: afib, s/p CVA  No Known Allergies  Patient Measurements: Weight: 65 kg (143 lb 4.8 oz) Heparin Dosing Weight:  56.7 kg  Vital Signs: Temp: 98.2 F (36.8 C) (11/13 1303) Temp Source: Oral (11/13 1303) BP: 163/101 (11/13 1303) Pulse Rate: 110 (11/13 1303)  Labs: Recent Labs    10/11/20 1706 10/11/20 1706 10/11/20 1746 10/11/20 2107 10/12/20 0314 10/12/20 1044 10/13/20 1009  HGB 12.4*   < >  --   --  11.7*  --  11.4*  HCT 36.7*  --   --   --  34.3*  --  32.4*  PLT 218  --   --   --  202  --  222  APTT 41*  --   --   --   --   --   --   LABPROT 13.7  --   --   --   --   --   --   INR 1.1  --   --   --   --   --   --   HEPARINUNFRC  --   --   --   --  0.48 0.40 0.45  CREATININE 1.86*  --   --   --  1.61*  --  1.40*  CKTOTAL  --   --   --   --   --   --  991*  TROPONINIHS  --   --  43* 35*  --   --   --    < > = values in this interval not displayed.    Estimated Creatinine Clearance: 48.4 mL/min (A) (by C-G formula based on SCr of 1.4 mg/dL (H)).  Assessment: CC/HPI: 1 YOM who presented on 11/5 with R MCA stroke s/p IR + stent placement for reocclusion   PMH: gout, HTN, BPH, sciatica, stomach ulcer s/p surgery,   Anticoag: CVA s/p thrombectomy, stent placement. New onset afib.  On IV heparin. Aiming for lower goal of 0.3-0.5 d/t R MCA stroke. Daily HL therapeutic this morning at 0.45.  No bleeding noted H/H 11.4/32.3, pltc 222k stable  Goal of Therapy:  Heparin level 0.3-0.5 units/ml Monitor platelets by anticoagulation protocol: Yes    Plan:  Continue heparin at 800 units/hr Daily Heparin level and CBC  Rexford Maus, PharmD PGY-1 Acute Care Pharmacy Resident Office: [phone] 10/13/2020 1:46 PM

## 2020-10-13 NOTE — Progress Notes (Signed)
PCCM Progress Note  Was asked to evaluate patient at bedside per Nursing concerns for swelling and tenderness in abdomen. On exam, there is erythema and warmth in his right flank laterally and posteriorly with tenderness to palpation. No crepitus or fluctuance. The area is most concerning for SSTI. Given new fever and evidence for cellulitis, will repeat blood cultures and add vancomycin. Continue Zosyn. If area does not improve, would consider CT a/p to evaluate for underlying abscess (although none palpated) or evidence for deeper infection. There is no compelling evidence for myositis or necrotizing infection at this time. Will send CK and lactic acid level; if either is elevated, further imaging would be prudent.  Marcelo Baldy, MD 10/13/20 2:29 AM

## 2020-10-13 NOTE — Progress Notes (Signed)
Observed redness on patients right side which is blanchable and painful. Elink on call was notified. Elink assessed via Elink monitor. Vancomycin was ordered and given in response.

## 2020-10-13 NOTE — Progress Notes (Signed)
CT abd/pelvis: perinephric vs. RP abscess with gas formation. Discussed with urology on call Dr. Cardell Peach: kidney looks decompressed would get CT-guided drain.  Reaching out to IR to see if they can get done tonight.  Heparin gtt stopped.  Myrla Halsted MD PCCM

## 2020-10-13 NOTE — Progress Notes (Signed)
Pharmacy Antibiotic Note  Mark May is a 64 y.o. male admitted on 10/15/2020, now with concern for cellulitis.  Pharmacy has been consulted for vancomycin dosing.  Plan: Vancomycin 750mg  x1 then 500mg  IV every 24 hours.  Goal trough 10-15 mcg/mL.  Weight: 56.5 kg (124 lb 9 oz)  Temp (24hrs), Avg:99.2 F (37.3 C), Min:97.7 F (36.5 C), Max:101.2 F (38.4 C)  Recent Labs  Lab 10/08/20 0727 10/08/20 0727 10/09/20 0329 10/10/20 0259 10/11/20 0915 10/11/20 1706 10/11/20 1906 10/12/20 0314 10/12/20 0453  WBC 8.4  --  6.5 5.1  --  5.4  --  5.8  --   CREATININE 1.39*   < > 1.48* 1.69* 1.96* 1.86*  --  1.61*  --   LATICACIDVEN  --   --   --   --   --   --  3.0*  --  1.6   < > = values in this interval not displayed.    Estimated Creatinine Clearance: 36.6 mL/min (A) (by C-G formula based on SCr of 1.61 mg/dL (H)).    No Known Allergies   Thank you for allowing pharmacy to be a part of this patient's care.  13/12/21, PharmD, BCPS  10/13/2020 2:28 AM

## 2020-10-13 NOTE — Progress Notes (Signed)
NAME:  Mark May, MRN:  [id number], DOB:  [birth year], LOS: 8 ADMISSION DATE:  10/01/2020, CONSULTATION DATE: 10/24/2020 REFERRING MD: Dr. Curtis Sites, CHIEF COMPLAINT: Left-sided weakness  Brief History   64 year old male with hypertension and hyperlipidemia who presented with left-sided weakness, noted to have right MCA occlusion status post thrombectomy and stent placement 11/5.  Hospitalization complicated by hypoxic respiratory failure secondary to pulmonary edema requiring BiPAP and lasix.  Awaiting CIR placement however on the evening of 10/10, he was noted to become more lethargic with tachycardia and tachypnea.  Had complained of abdominal pain with some distention and chest pain which resolved after burping.  KUB obtained which was normal.  On 11/11, some what more responsive but now with increasing sCr in which lisinopril was stopped and development of fever 102.6  TRH initially consulted for help with medical management however on their evaluation, PCCM consulted given ill appearance and developing hypotension and new onset Afib with RVR.  Past Medical History  Hypertension Peptic ulcer disease Gout BPH Sciatica   Significant Hospital Events   11/5 Mechanical thrombectomy of right MCA and stent 11/5 11/11 PCCM reconsulted   Consults:  IR Neurology PCCM 11/5- 11/8; 11/11 [facility]  Procedures:  11/6 right femoral sheath was removed  Significant Diagnostic Tests:  11/5 CT head: No acute abnormality. Minimal diffuse cerebral and cerebellar atrophy. Mild chronic small vessel white matter ischemic changes in both cerebral hemispheres  11/5 CTA head and neck: Severe distal right M1 stenosis or subocclusive thrombus, Right MCA infarct with extensive penumbra, 4 mm right supraclinoid ICA aneurysm. Widely patent cervical carotid and vertebral arteries.  11/5 postprocedure CT head: Hypodensity in the right lateral temporal lobe now shows mild hemorrhage or contrast enhancement. This is  most consistent with acute infarct. There has been interval stenting of the right middle cerebral artery.  11/6 MRI brain: 1. Fairly extensive patchy acute/early subacute infarcts within the right MCA/watershed territory.  2. Additional small acute/early subacute infarcts within the dorsal right thalamus and left basal ganglia. 3. Background moderate chronic small vessel ischemic disease.  11/6  MRA head: 1. Interval stenting of the M1/M2 right middle cerebral artery. Flow related signal is present proximal and distal to the stent suggestive of stent patency. 2. 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  11/5 DG Abd 1 View Gastric catheter within the stomach kinked at the proximal side port. This could be withdrawn slightly S necessary.   11/6 Transthoracic Echocardiogram  1. Left ventricular ejection fraction, by estimation, is 70 to 75%. The left ventricle has hyperdynamic function. The left ventricle has no regional wall motion abnormalities. Left ventricular diastolic parameters are consistent with Grade II diastolic dysfunction (pseudonormalization). Elevated left atrial pressure.  2. Right ventricular systolic function is normal. The right ventricular size is normal.  3. The mitral valve is normal in structure. Mild mitral valve regurgitation. No evidence of mitral stenosis.  4. The aortic valve is normal in structure. Aortic valve regurgitation is not visualized. No aortic stenosis is present.  5. The inferior vena cava is dilated in size with <50% respiratory variability, suggesting right atrial pressure of 15 mmHg.  11/10 KUB >> normal gas pattern  Micro Data:  11/5 MRSA PCR negative 11/10 UC >> 11/11 BCx2 >>  Antimicrobials:  11/5 cefazolin 11/11 Zosyn >  Interim history/subjective:  Sitting up in bed with appearance of discomfort and mild agitation  Developed abdominal erythema, tenderness, and swelling overnight, evaluated by  team  with concern for cellulitis for which vancomycin was started. Remains in A. fib with better rate control and typically tachypneic with a rate of 44  Objective   Blood pressure (!) 150/73, pulse (!) 106, temperature 97.9 F (36.6 C), temperature source Oral, resp. rate (!) 38, weight 65 kg, SpO2 98 %.        Intake/Output Summary (Last 24 hours) at 10/13/2020 0759 Last data filed at 10/13/2020 6222 Gross per 24 hour  Intake 2467.23 ml  Output --  Net 2467.23 ml   Filed Weights   10/12/20 0500 10/13/20 0414  Weight: 56.5 kg 65 kg    Examination: General: Chronically ill appearing deconditioned frail elderly male lying in bed in mild discomfort HEENT: [place]/AT, MM pink/moist, PERRL,  Neuro: Alert and interactive, difficult to communicate given the language barrier, left hemiplegia CV: A. fib with irregularly irregular rate, no murmur, rubs, or gallops,  PULM: Tachypnea with shallow respirations moderate tachypnea with a rate in 30-40's, no added breath sounds anteriorly GI: soft, bowel sounds hypoactive in all 4 quadrants, tender to palpation most on the right flank area been, abdomen and right flank top with mild erythema,, tolerating TF via core track Extremities: warm/dry, no pitting peripheral edema  Skin: no rashes or lesions  Resolved Hospital Problem list    Assessment & Plan:   Sepsis due to right lower lobe pneumonia likely aspiration Acute kidney injury- improved with IVF Metabolic encephalopathy- related to sepsis and increased metabolic demand Paroxysmal atrial fibrillation with rapid ventricular response 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 and right MCA stent placement by IR with TICI 3- persistent left hemiplegia Acute diastolic congestive heart failure Hypertension Hyperlipidemia  Plan: Discontinue IV hydration today in favor of enteral hydration Continue Zosyn and added vancomycin Core track placed 11/12 for enteral feeds, continue tube  feeds Encourage sleep-wake cycle Mobilize as able Avoid sedating medications Avoid nephrotoxins, strict intake and output, daily weight Continue amiodarone and heparin drip Add low-dose beta-blocker Will discuss with attending physician need for CT scan of abdomen to further evaluate Low threshold to move to intensive care unit    Signature Delfin Gant, NP-C [facility] Contact / Pager information can be found on Amion  10/13/2020, 8:00 AM

## 2020-10-13 NOTE — Progress Notes (Signed)
STROKE TEAM PROGRESS NOTE   INTERVAL HISTORY Wife at bedside. I was able to communicate with pt and wife in [place]. Per wife, pt very lethargic and breathing fast and HR was high. Pt complain of abdominal pain, more at R side both upper and lower but also pain at left side but less than right. On abdominal exam, significant abdominal extension, tenderness on palpation. Gas sound on ascultation. Concerning for bowel obstruction, stopped tube feeding and free water. Stat KUB. Dr. Katrinka BlazingSmith has ordered CT abdomen and pelvis. Not sure how much oral contrast he is able to take though.   OBJECTIVE Vitals:   10/12/20 2031 10/12/20 2356 10/13/20 0406 10/13/20 0414  BP: (!) 161/84 135/73 (!) 150/73   Pulse: (!) 113 (!) 104 (!) 106   Resp: (!) 34 (!) 38 (!) 38   Temp: (!) 101.2 F (38.4 C) 97.7 F (36.5 C) 97.9 F (36.6 C)   TempSrc: Oral Oral Oral   SpO2: 97% 94% 98%   Weight:    65 kg   CBC:  Recent Labs  Lab 10/11/20 1706 10/12/20 0314  WBC 5.4 5.8  NEUTROABS 4.8  --   HGB 12.4* 11.7*  HCT 36.7* 34.3*  MCV 87.0 86.0  PLT 218 202   Basic Metabolic Panel:  Recent Labs  Lab 10/08/20 0727 10/11/20 1706 10/11/20 1746 10/12/20 0314 10/12/20 1814  NA  --  140  --  143  --   K  --  3.6  --  3.8  --   CL  --  110  --  113*  --   CO2  --  18*  --  19*  --   GLUCOSE  --  155*  --  132*  --   BUN  --  47*  --  42*  --   CREATININE  --  1.86*  --  1.61*  --   CALCIUM  --  8.1*  --  8.0*  --   MG   < >  --  2.3  --  2.2  PHOS  --  1.6*  --   --  2.9   < > = values in this interval not displayed.   Lipid Panel:     Component Value Date/Time   CHOL 173 10/06/2020 0522   CHOL 216 (H) 03/09/2018 1620   TRIG 396 (H) 10/07/2020 0527   HDL 33 (L) 10/06/2020 0522   HDL 59 03/09/2018 1620   CHOLHDL 5.2 10/06/2020 0522   VLDL 55 (H) 10/06/2020 0522   LDLCALC 85 10/06/2020 0522   LDLCALC 136 (H) 03/09/2018 1620   HgbA1c:  Lab Results  Component Value Date   HGBA1C 5.8 (H) 10/06/2020    Urine Drug Screen: No results found for: LABOPIA, COCAINSCRNUR, LABBENZ, AMPHETMU, THCU, LABBARB  Alcohol Level No results found for: [facility]  [facility]AGING  CT HEAD WO CONTRAST 10/08/2020 1. No acute abnormality.  2. Minimal diffuse cerebral and cerebellar atrophy.  3. Mild chronic small vessel white matter ischemic changes in both cerebral hemispheres.   CT Angio Head W or Wo Contrast CT Angio Neck W and/or Wo Contrast CT CEREBRAL PERFUSION W CONTRAST 10/17/2020 1. 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  2. Right MCA infarct with extensive penumbra.  3. 4 mm right supraclinoid ICA aneurysm.  4. Widely patent cervical carotid and vertebral arteries.  5. Aortic Atherosclerosis (ICD10-I70.0).   CT HEAD WO CONTRAST 10/01/2020 Hypodensity in the right lateral temporal lobe now shows mild hemorrhage  or contrast enhancement. This is most consistent with acute infarct. There has been interval stenting of the right middle cerebral artery.   CT HEAD WO CONTRAST 10/17/2020 Right temporal lobe hypodensity compatible with acute infarct. Decreased hyperdensity within this stroke compared with 4 hours previously compatible with contrast staining rather than hemorrhage. Small area of acute infarct in the right parietal lobe now evident. Right MCA stent.   MRI brain: 10/06/20 1. Fairly extensive patchy acute/early subacute infarcts within the right MCA/watershed territory.  2. Additional small acute/early subacute infarcts within the dorsal right thalamus and left basal ganglia. 3. Background moderate chronic small vessel ischemic disease.  MRA head: 10/06/20 1. Interval stenting of the M1/M2 right middle cerebral artery. Flow related signal is present proximal and distal to the stent suggestive of stent patency. 2. 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  DG Abd 1 View 10/20/2020 Gastric catheter within the stomach kinked at the proximal side  port. This could be withdrawn slightly S necessary.   Transthoracic Echocardiogram  10/06/2020 1. Left ventricular ejection fraction, by estimation, is 70 to 75%. The left ventricle has hyperdynamic function. The left ventricle has no regional wall motion abnormalities. Left ventricular diastolic parameters are consistent with Grade II diastolic dysfunction (pseudonormalization). Elevated left atrial pressure.  2. Right ventricular systolic function is normal. The right ventricular size is normal.  3. The mitral valve is normal in structure. Mild mitral valve regurgitation. No evidence of mitral stenosis.  4. The aortic valve is normal in structure. Aortic valve regurgitation is not visualized. No aortic stenosis is present.  5. The inferior vena cava is dilated in size with <50% respiratory variability, suggesting right atrial pressure of 15 mmHg.  ECG - SR rate 70 BPM. (See cardiology reading for complete details)   PHYSICAL EXAM    Temp:  [97.7 F (36.5 C)-101.2 F (38.4 C)] 98.2 F (36.8 C) (11/13 1303) Pulse Rate:  [104-114] 110 (11/13 1303) Resp:  [27-38] 27 (11/13 1303) BP: (128-163)/(73-101) 163/101 (11/13 1303) SpO2:  [94 %-98 %] 94 % (11/13 1303) Weight:  [65 kg] 65 kg (11/13 0414)  General - Well nourished, well developed, in acute distress with tachypnea and abdominal pain.  Ophthalmologic - fundi not visualized due to noncooperation.  Cardiovascular - Regular rhythm and rate.  Abdominal - abdominal extension, tenderness on palpation, gas sound on ascultation.   Neuro - awake alert, lethargic, orientated to self, age and people and place. Moderate dysarthria, no aphasia but paucity of speech, able to name and repeat, follows simple commands. Tracking bilaterally, seems to have intact visual field. PERRL, no gaze deviation or palsy. Left facial droop. Tongue midline. Left UE flaccid. Left LE mild withdraw to pain. RUE at least 4/5 and RLE at least 3/5. Sensation,  coordination not cooperative and gait not tested.   ASSESSMENT/PLAN Mr. Denym Rahimi is a 64 y.o. male with history of hypertension, BPH, sciatica, stomach ulcer status post surgery in the past presented to ER at [facility] for acute onset left-sided weakness, left facial droop and left hemianopia.  CT head and neck showed right M1 high-grade stenosis versus near occlusive thrombosis.  CT perfusion showed large penumbra.  Patient transferred to [facility] for thrombectomy.  He did not receive IV t-PA due to late presentation (>4.5 hours from time of onset). IR - complete occlusion of RT MCA M 1 seg - thrombectomy ->TICI 3 revascularization with placement of rescue stent m4 mm x 24  mm Atlas for reocclusion with restoration of TICI 3 .  Stroke: R MCA infarct s/p IR TICI3 R M1 w/ stent placement - likely embolic due to new diagnosed afib  CT Head - No acute abnormality. Minimal diffuse cerebral and cerebellar atrophy.    CTA H&N - Severe distal right M1 stenosis or subocclusive thrombus. 4 mm right supraclinoid ICA aneurysm. Widely patent cervical carotid and vertebral arteries.   CTP - Right MCA infarct with extensive penumbra.   MRI head - Fairly extensive patchy acute/early subacute infarcts within the right MCA/watershed territory. Additional small acute/early subacute infarcts within the dorsal right thalamus and left basal ganglia.  MRA head -  Interval stenting of the M1/M2 right middle cerebral artery. Flow related signal is present proximal and distal to the stent suggestive of stent patency. 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  2D Echo - EF 70 - 75%. No cardiac source of emboli identified.   UE doppler neg DVT  Loyal Jacobson Virus 2 - negative  LDL - 85  HgbA1c - 5.8  VTE prophylaxis - SCDs  No antithrombotic prior to admission, now on Brilinta (ticagrelor) 90 mg bid and heparin IV    Therapy recommendations:   CIR  Disposition:  Pending  Atrial Fibrillation w/ RVR, new diagnosis 11/11  CHA2DS2-VASc Score = at least 5,  ?2 oral anticoagulation recommended  Age in Years:  41-74   +1    Sex:  Male   0    Hypertension History:  yes   +1     Diabetes Mellitus:  0  Congestive Heart Failure History:  yes   +1   Vascular Disease History:  0     Stroke/TIA/Thromboembolism History:  yes   +2 . Off amio gtt -> amio po now . On IV heparin   Sepsis with fever RLL PNA, likely aspiration Abdominal pain  CXR 11/13 persistent retrocardiac left base collapse/consolidation and diffuse bilateral airspace disease  Tmax 102.6->101.2  WBC 5.8->9.4  On Zosyn and vanco  Blood Cx NGTD  CT abdomen and pelvis pending  CCM on board  AKI  Cre 1.48-1.69-1.96->1.61->1.40  Stopped lisinopril.   On IVF  Avoid nephrotoxins  Hypertension Tachycardia  Home BP meds: Amlodipine  off Cleviprex  Stable . BP goal <180 . Stopped lisinopril d/t rising Cre . Long-term BP goal normotensive  Hyperlipidemia  Home Lipid lowering medication: none  LDL 85, goal < 70  Off lipitor now due to elevated CK level  Continue statin at discharge once CK level normalizes  Dysphagia . Secondary to stroke . TF and FW on hold due to abdominal extension . Speech on board   Other Stroke Risk Factors  Advanced age  Other Active Problems  Aortic Atherosclerosis (ICD10-I70.0).   4 mm right supraclinoid ICA aneurysm.  Hypokalemia - 3.1 -> IV potassium  Hospital day # 8   Patient condition worsened within the last 24 hours, has developed fever, tachycardia, tachypnea, continues to have abdominal extension and abdominal pain, and I have discussed with Dr. Katrinka Blazing CCM, pending abdominal CT. I spent  35 minutes in total face-to-face time with the patient, more than 50% of which was spent in counseling and coordination of care, reviewing test results, images and medication, and discussing the diagnosis, treatment  plan and potential prognosis. This patient's care requiresreview of multiple databases, neurological assessment, discussion with family, other specialists and medical decision making of high complexity. I had long discussion with wife at bedside  and son over the phone, updated pt current condition, treatment plan and potential prognosis, and answered all the questions. She expressed understanding and appreciation.   Marvel Plan, MD PhD Stroke Neurology 10/13/2020 10:02 PM    to contact Stroke Continuity provider, please refer to [email or web address]. After hours, contact General Neurology

## 2020-10-13 NOTE — Consult Note (Signed)
Urology Consult   Physician requesting consult: Levon Hedger, MD  Reason for consult: Large right perinephric fluid collection  History of Present Illness: Mark May is a 64 y.o. 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  His hospitalization was complicated by hypoxic respiratory failure due to pulmonary edema, A. fib with RVR and sepsis due to right lower lobe pneumonia and likely aspiration.  He is now found to have a large right perinephric fluid collection extending along the psoas to the pelvis as below.  CT A/P 10/13/2020 revealed a large right perinephric fluid collection and pockets of air concerning for an infectious process measuring 7.4 x 4.3 cm and 17 cm in craniocaudal length.  This extends along the right psoas down into the pelvis.  CT does demonstrate small nonobstructing right renal calculi with no hydronephrosis.  He is found to have multiple bilateral renal hypodense lesions that were not well characterized with this contrast study.  Interview was done with the aid of interpreter and his wife.  He does have a history of urolithiasis and is passed stones prior.  He never required intervention.  He denies history of urinary tract infections.  He denies history of voiding or storage urinary symptoms.  He denies history of gross hematuria.  He denies a family history of urologic malignancy.  Past Medical History:  Diagnosis Date  . Gout   . Hypertension     Past Surgical History:  Procedure Laterality Date  . PARTIAL GASTRECTOMY  1990   done in [place] for PUD  . PROSTATE BIOPSY  ~ 2005   negative for cancer (done in [place])  . RADIOLOGY WITH ANESTHESIA N/A 30-Oct-2020   Procedure: IR WITH ANESTHESIA;  Surgeon: Radiologist, Medication, MD;  Location: [facility] OR;  Service: Radiology;  Laterality: N/A;    Medications:  Home meds:  No current facility-administered medications on file prior to encounter.   Current Outpatient  Medications on File Prior to Encounter  Medication Sig Dispense Refill  . allopurinol (ZYLOPRIM) 100 MG tablet Take 3 tablets (300 mg total) by mouth daily. (Patient not taking: Reported on 10/09/2020) 90 tablet 3  . amLODipine (NORVASC) 10 MG tablet Take 1 tablet (10 mg total) by mouth daily. (Patient not taking: Reported on 10/09/2020) 30 tablet 5  . tamsulosin (FLOMAX) 0.4 MG CAPS capsule Take 1 capsule (0.4 mg total) by mouth daily after supper. (Patient not taking: Reported on 10/09/2020) 30 capsule 5     Scheduled Meds: .  stroke: mapping our early stages of recovery book   Does not apply Once  . [START ON 10/20/2020] amiodarone  200 mg Per Tube Daily   Followed by  . amiodarone  200 mg Per Tube BID  . chlorhexidine  15 mL Mouth Rinse BID  . Chlorhexidine Gluconate Cloth  6 each Topical Daily  . insulin aspart  0-9 Units Subcutaneous Q4H  . mouth rinse  15 mL Mouth Rinse q12n4p  . sodium phosphate  1 enema Rectal Once  . ticagrelor  90 mg Oral BID   Or  . ticagrelor  90 mg Per Tube BID   Continuous Infusions: . sodium chloride Stopped (10/13/20 1620)  . famotidine (PEPCID) IV Stopped (10/12/20 2315)  . piperacillin-tazobactam (ZOSYN)  IV 12.5 mL/hr at 10/13/20 1800  . potassium chloride 100 mL/hr at 10/13/20 1800  . [START ON 10/14/2020] vancomycin     PRN Meds:.sodium chloride, acetaminophen **OR** acetaminophen (TYLENOL) oral liquid 160 mg/5 mL **OR**  acetaminophen, bisacodyl, Resource ThickenUp Clear, senna-docusate  Allergies: No Known Allergies  Family History  Problem Relation Age of Onset  . Diabetes Mother   . Diabetes Father     Social History:  reports that he has never smoked. He has never used smokeless tobacco. He reports that he does not drink alcohol and does not use drugs.  ROS: A complete review of systems was performed.  All systems are negative except for pertinent findings as noted.  Physical Exam:  Vital signs in last 24 hours: Temp:  [97.7 F  (36.5 C)-101.2 F (38.4 C)] 99 F (37.2 C) (11/13 1652) Pulse Rate:  [104-114] 114 (11/13 1652) Resp:  [27-42] 39 (11/13 1652) BP: (128-163)/(73-101) 142/90 (11/13 1652) SpO2:  [94 %-98 %] 97 % (11/13 1500) Weight:  [65 kg] 65 kg (11/13 0414) Constitutional:  No acute distress Cardiovascular: Tachycardic, regular rhythm Respiratory: Normal respiratory effort GI: Abdomen is soft, nondistended, tender over the right flank; right CVA tenderness present. Genitourinary: No CVAT. Normal male phallus, testes are descended bilaterally and non-tender and without masses, scrotum is normal in appearance without lesions or masses, perineum is normal on inspection.  Condom catheter in place draining clear yellow urine. Neurologic: Grossly intact, no focal deficits Psychiatric: Normal mood and affect  Laboratory Data:  Recent Labs    10/11/20 1706 10/12/20 0314 10/13/20 1009  WBC 5.4 5.8 9.4  HGB 12.4* 11.7* 11.4*  HCT 36.7* 34.3* 32.4*  PLT 218 202 222    Recent Labs    10/11/20 0915 10/11/20 1706 10/12/20 0314 10/13/20 1009  NA 139 140 143 143  K 4.1 3.6 3.8 3.1*  CL 111 110 113* 111  GLUCOSE 248* 155* 132* 194*  BUN 47* 47* 42* 43*  CALCIUM 7.7* 8.1* 8.0* 7.5*  CREATININE 1.96* 1.86* 1.61* 1.40*     Results for orders placed or performed during the hospital encounter of 18-Oct-2020 (from the past 24 hour(s))  Glucose, capillary     Status: Abnormal   Collection Time: 10/12/20  9:40 PM  Result Value Ref Range   Glucose-Capillary 151 (H) 70 - 99 mg/dL  Glucose, capillary     Status: Abnormal   Collection Time: 10/13/20 12:04 AM  Result Value Ref Range   Glucose-Capillary 152 (H) 70 - 99 mg/dL   Comment 1 Notify RN    Comment 2 Document in Chart   Culture, blood (routine x 2)     Status: None (Preliminary result)   Collection Time: 10/13/20  3:01 AM   Specimen: BLOOD LEFT HAND  Result Value Ref Range   Specimen Description BLOOD LEFT HAND    Special Requests      AEROBIC  BOTTLE ONLY Blood Culture results may not be optimal due to an inadequate volume of blood received in culture bottles   Culture      NO GROWTH < 12 HOURS Performed at [facility], [address], [place], [place] [address]    Report Status PENDING   Glucose, capillary     Status: Abnormal   Collection Time: 10/13/20  3:19 AM  Result Value Ref Range   Glucose-Capillary 169 (H) 70 - 99 mg/dL  Glucose, capillary     Status: Abnormal   Collection Time: 10/13/20  4:28 AM  Result Value Ref Range   Glucose-Capillary 149 (H) 70 - 99 mg/dL   Comment 1 Notify RN    Comment 2 Document in Chart   Glucose, capillary     Status: Abnormal  Collection Time: 10/13/20  7:34 AM  Result Value Ref Range   Glucose-Capillary 191 (H) 70 - 99 mg/dL  Heparin level (unfractionated)     Status: None   Collection Time: 10/13/20 10:09 AM  Result Value Ref Range   Heparin Unfractionated 0.45 0.30 - 0.70 IU/mL  Magnesium     Status: None   Collection Time: 10/13/20 10:09 AM  Result Value Ref Range   Magnesium 2.1 1.7 - 2.4 mg/dL  Phosphorus     Status: None   Collection Time: 10/13/20 10:09 AM  Result Value Ref Range   Phosphorus 2.6 2.5 - 4.6 mg/dL  CBC     Status: Abnormal   Collection Time: 10/13/20 10:09 AM  Result Value Ref Range   WBC 9.4 4.0 - 10.5 K/uL   RBC 3.82 (L) 4.22 - 5.81 MIL/uL   Hemoglobin 11.4 (L) 13.0 - 17.0 g/dL   HCT 95.6 (L) 39 - 52 %   MCV 84.8 80.0 - 100.0 fL   MCH 29.8 26.0 - 34.0 pg   MCHC 35.2 30.0 - 36.0 g/dL   RDW 21.3 08.6 - 57.8 %   Platelets 222 150 - 400 K/uL   nRBC 0.0 0.0 - 0.2 %  Basic metabolic panel     Status: Abnormal   Collection Time: 10/13/20 10:09 AM  Result Value Ref Range   Sodium 143 135 - 145 mmol/L   Potassium 3.1 (L) 3.5 - 5.1 mmol/L   Chloride 111 98 - 111 mmol/L   CO2 20 (L) 22 - 32 mmol/L   Glucose, Bld 194 (H) 70 - 99 mg/dL   BUN 43 (H) 8 - 23 mg/dL   Creatinine, Ser 4.69 (H) 0.61 - 1.24 mg/dL   Calcium 7.5 (L) 8.9 - 10.3 mg/dL    GFR, Estimated 56 (L) >60 mL/min   Anion gap 12 5 - 15  Hepatic function panel     Status: Abnormal   Collection Time: 10/13/20 10:09 AM  Result Value Ref Range   Total Protein 5.0 (L) 6.5 - 8.1 g/dL   Albumin 1.4 (L) 3.5 - 5.0 g/dL   AST 629 (H) 15 - 41 U/L   ALT 60 (H) 0 - 44 U/L   Alkaline Phosphatase 163 (H) 38 - 126 U/L   Total Bilirubin 0.8 0.3 - 1.2 mg/dL   Bilirubin, Direct 0.3 (H) 0.0 - 0.2 mg/dL   Indirect Bilirubin 0.5 0.3 - 0.9 mg/dL  CK     Status: Abnormal   Collection Time: 10/13/20 10:09 AM  Result Value Ref Range   Total CK 991 (H) 49.0 - 397.0 U/L  Lactic acid, plasma     Status: None   Collection Time: 10/13/20 10:09 AM  Result Value Ref Range   Lactic Acid, Venous 1.8 0.5 - 1.9 mmol/L  Glucose, capillary     Status: Abnormal   Collection Time: 10/13/20 11:06 AM  Result Value Ref Range   Glucose-Capillary 160 (H) 70 - 99 mg/dL   Comment 1 Notify RN    Comment 2 Document in Chart   Glucose, capillary     Status: Abnormal   Collection Time: 10/13/20  4:37 PM  Result Value Ref Range   Glucose-Capillary 105 (H) 70 - 99 mg/dL   Comment 1 Notify RN    Comment 2 Document in Chart   Magnesium     Status: None   Collection Time: 10/13/20  4:58 PM  Result Value Ref Range   Magnesium 2.1 1.7 - 2.4 mg/dL  Phosphorus     Status: None   Collection Time: 10/13/20  4:58 PM  Result Value Ref Range   Phosphorus 2.6 2.5 - 4.6 mg/dL   Recent Results (from the past 240 hour(s))  Respiratory Panel by RT PCR (Flu A&B, Covid) - Nasopharyngeal Swab     Status: None   Collection Time: 10/15/2020  2:44 PM   Specimen: Nasopharyngeal Swab  Result Value Ref Range Status   SARS Coronavirus 2 by RT PCR NEGATIVE NEGATIVE Final    Comment: (NOTE) SARS-CoV-2 target nucleic acids are NOT DETECTED.  The SARS-CoV-2 RNA is generally detectable in upper respiratoy specimens during the acute phase of infection. The lowest concentration of SARS-CoV-2 viral copies this assay can detect  is 131 copies/mL. A negative result does not preclude SARS-Cov-2 infection and should not be used as the sole basis for treatment or other patient management decisions. A negative result may occur with  improper specimen collection/handling, submission of specimen other than nasopharyngeal swab, presence of viral mutation(s) within the areas targeted by this assay, and inadequate number of viral copies (<131 copies/mL). A negative result must be combined with clinical observations, patient history, and epidemiological information. The expected result is Negative.  Fact Sheet for Patients:  https://www.moore.com/https://www.fda.gov/media/142436/download  Fact Sheet for Healthcare Providers:  https://www.young.biz/https://www.fda.gov/media/142435/download  This test is no t yet approved or cleared by the [place] FDA and  has been authorized for detection and/or diagnosis of SARS-CoV-2 by FDA under an Emergency Use Authorization (EUA). This EUA will remain  in effect (meaning this test can be used) for the duration of the COVID-19 declaration under Section 564(b)(1) of the Act, 21 U.S.C. section 360bbb-3(b)(1), unless the authorization is terminated or revoked sooner.     Influenza A by PCR NEGATIVE NEGATIVE Final   Influenza B by PCR NEGATIVE NEGATIVE Final    Comment: (NOTE) The Xpert Xpress SARS-CoV-2/FLU/RSV assay is intended as an aid in  the diagnosis of influenza from Nasopharyngeal swab specimens and  should not be used as a sole basis for treatment. Nasal washings and  aspirates are unacceptable for Xpert Xpress SARS-CoV-2/FLU/RSV  testing.  Fact Sheet for Patients: https://www.moore.com/https://www.fda.gov/media/142436/download  Fact Sheet for Healthcare Providers: https://www.young.biz/https://www.fda.gov/media/142435/download  This test is not yet approved or cleared by the [place] FDA and  has been authorized for detection and/or diagnosis of SARS-CoV-2 by  FDA under an Emergency Use Authorization (EUA). This EUA will remain  in effect  (meaning this test can be used) for the duration of the  Covid-19 declaration under Section 564(b)(1) of the Act, 21  U.S.C. section 360bbb-3(b)(1), unless the authorization is  terminated or revoked. Performed at [facility], [address] [place], [place] [address]   MRSA PCR Screening     Status: None   Collection Time: 10/24/2020  6:34 PM   Specimen: Nasal Mucosa; Nasopharyngeal  Result Value Ref Range Status   MRSA by PCR NEGATIVE NEGATIVE Final    Comment:        The GeneXpert MRSA Assay (FDA approved for NASAL specimens only), is one component of a comprehensive MRSA colonization surveillance program. It is not intended to diagnose MRSA infection nor to guide or monitor treatment for MRSA infections. Performed at [facility], [address] [place], [place] [address]   Culture, Urine     Status: None   Collection Time: 10/10/20  5:13 PM   Specimen: Urine, Catheterized  Result Value Ref Range Status   Specimen Description URINE, CATHETERIZED  Final   Special Requests NONE  Final   Culture   Final    NO GROWTH Performed at [facility], [address], [place], [place] [address]    Report Status 10/11/2020 FINAL  Final  Culture, blood (x 2)     Status: None (Preliminary result)   Collection Time: 10/11/20  7:13 PM   Specimen: BLOOD  Result Value Ref Range Status   Specimen Description BLOOD SITE NOT SPECIFIED  Final   Special Requests   Final    BOTTLES DRAWN AEROBIC AND ANAEROBIC Blood Culture results may not be optimal due to an inadequate volume of blood received in culture bottles   Culture   Final    NO GROWTH 2 DAYS Performed at [facility], [address] [place], [place] [address]    Report Status PENDING  Incomplete  Culture, blood (x 2)     Status: None (Preliminary result)   Collection Time: 10/11/20  7:15 PM   Specimen: BLOOD  Result Value Ref Range Status   Specimen Description BLOOD SITE NOT SPECIFIED  Final    Special Requests   Final    BOTTLES DRAWN AEROBIC AND ANAEROBIC Blood Culture adequate volume   Culture   Final    NO GROWTH 2 DAYS Performed at [facility], [address] [place], [place] [address]    Report Status PENDING  Incomplete  Culture, blood (routine x 2)     Status: None (Preliminary result)   Collection Time: 10/13/20  3:01 AM   Specimen: BLOOD LEFT HAND  Result Value Ref Range Status   Specimen Description BLOOD LEFT HAND  Final   Special Requests   Final    AEROBIC BOTTLE ONLY Blood Culture results may not be optimal due to an inadequate volume of blood received in culture bottles   Culture   Final    NO GROWTH < 12 HOURS Performed at [facility], [address], [place], [place] [address]    Report Status PENDING  Incomplete    Renal Function: Recent Labs    10/08/20 0727 10/09/20 0329 10/10/20 0259 10/11/20 0915 10/11/20 1706 10/12/20 0314 10/13/20 1009  CREATININE 1.39* 1.48* 1.69* 1.96* 1.86* 1.61* 1.40*   Estimated Creatinine Clearance: 48.4 mL/min (A) (by C-G formula based on SCr of 1.4 mg/dL (H)).  Radiologic Imaging: CT ABDOMEN PELVIS WO CONTRAST  Result Date: 10/13/2020 CLINICAL DATA:  64 year old male with abdominal abscess or infection. EXAM: CT ABDOMEN AND PELVIS WITHOUT CONTRAST TECHNIQUE: Multidetector CT imaging of the abdomen and pelvis was performed following the standard protocol without IV contrast. COMPARISON:  Renal ultrasound dated 10/11/2020. FINDINGS: Evaluation of this exam is limited in the absence of intravenous contrast. Lower chest: Partially visualized small bilateral pleural effusions with complete consolidative changes of the visualized lower lobes. No intra-abdominal free air. There is a small free fluid in the pelvis. Hepatobiliary: The liver is unremarkable. No intrahepatic biliary dilatation. There are multiple stones in the gallbladder. No pericholecystic fluid or evidence of acute cholecystitis by CT. Pancreas:  Unremarkable. No pancreatic ductal dilatation or surrounding inflammatory changes. Spleen: Normal in size without focal abnormality. Adrenals/Urinary Tract: The adrenal glands unremarkable. Multiple nonobstructing right renal calculi measure up to 4 mm in the inferior pole of the right kidney. No hydronephrosis. There is no hydronephrosis or nephrolithiasis on the left. Multiple bilateral renal hypodense lesions are not characterized on this noncontrast CT. There is fluid collection with scattered pockets of air  in the right perinephric space and along the right psoas muscle extending down into the pelvis. The fluid collection measures 7.4 x 4.3 cm in greatest axial dimensions and 17 cm in craniocaudal length. There is air within the urinary bladder, possibly related to recent instrumentation. An infectious process is not excluded. Stomach/Bowel: A feeding tube is noted with tip appearing in the distal stomach. Evaluation however is limited due to respiratory motion. Contrast is noted in the colon. There is distal colonic diverticulosis without active inflammatory changes. No bowel obstruction. The appendix is not visualized with certainty. Vascular/Lymphatic: Moderate aortoiliac atherosclerotic disease. The IVC is unremarkable. No portal venous gas. There is no adenopathy. Reproductive: The prostate and seminal vesicles are grossly unremarkable. Other: There is mild diffuse subcutaneous edema. No fluid collection. Musculoskeletal: No acute or significant osseous findings. IMPRESSION: 1. Large right perinephric fluid collection and pockets of air concerning for an infectious process/abscess. Clinical correlation is recommended. 2. Cholelithiasis. 3. Nonobstructing right renal calculi. No hydronephrosis. 4. Colonic diverticulosis. No bowel obstruction. 5. Partially visualized small bilateral pleural effusions with complete consolidative changes of the visualized lower lobes. 6. Aortic Atherosclerosis (ICD10-I70.0).  Electronically Signed   By: Elgie Collard M.D.   On: 10/13/2020 16:42   DG Abd 1 View  Result Date: 10/13/2020 CLINICAL DATA:  Abdominal distension EXAM: ABDOMEN - 1 VIEW COMPARISON:  October 10, 2020 FINDINGS: Flocculated enteric contrast is seen within the colon. Moderate predominately RIGHT-sided colonic stool burden. Gaseous distension of loops of colon without dilation. Feeding tube tip terminates over the distal stomach. Probable nephrolithiasis. Visualized osseous structures are unremarkable. IMPRESSION: Feeding tube tip terminates over the distal stomach. Electronically Signed   By: Meda Klinefelter MD   On: 10/13/2020 14:30   US RENAL  Result Date: 10/11/2020 CLINICAL DATA:  Acute renal insufficiency EXAM: RENAL / URINARY TRACT ULTRASOUND COMPLETE COMPARISON:  None. FINDINGS: Right Kidney: Renal measurements: 10.5 x 5.4 x 4.9 cm = volume: 145 mL. Echogenicity within normal limits. No mass or hydronephrosis visualized. 1.8 cm simple exophytic cortical cyst noted. Left Kidney: Renal measurements: 11.4 x 6.2 x 4.8 cm = volume: 176 mL. Echogenicity within normal limits. No mass or hydronephrosis visualized. 2.7 cm exophytic simple cortical cyst noted. Bladder: Decompressed and not well visualized. Other: None. IMPRESSION: Normal renal sonogram Electronically Signed   By: Helyn Numbers MD   On: 10/11/2020 21:01   DG Chest Port 1 View  Result Date: 10/13/2020 CLINICAL DATA:  Respiratory failure. EXAM: PORTABLE CHEST 1 VIEW COMPARISON:  09/10/2020 FINDINGS: 0515 hours. Low lung volumes. Retrocardiac left base collapse/consolidation again noted with diffuse interstitial and bilateral airspace disease. Small bilateral effusions persist. A feeding tube passes into the stomach although the distal tip position is not included on the film. Telemetry leads overlie the chest. IMPRESSION: Low volume film with persistent retrocardiac left base collapse/consolidation and diffuse bilateral airspace  disease. Electronically Signed   By: Kennith Center M.D.   On: 10/13/2020 08:12   [place] EKG SITE RITE  Result Date: 10/13/2020 If Site Rite image not attached, placement could not be confirmed due to current cardiac rhythm.   I independently reviewed the above imaging studies.  Impression/Recommendation 1. Right perinephric abscess: Measures 7.4 x 4.3 x 17 cm along the right psoas down to the pelvis on CT A/P 10/13/2020.  Afebrile, WBC 9.4, urine culture 11/10 no growth. 2. Sepsis due to right lower lobe pneumonia and now perinephric abscess 3. AKI: Creatinine 1.4 on day of consultation from baseline of  0.8-1 4. 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  Hospitalization complicated by hypoxic respiratory failure due to pulmonary edema.  Also complicated by A. fib with RVR.  mL sepsis due to right lower lobe pneumonia and aspiration and perinephric abscess as above. 5. Multiple bilateral hypodense renal lesions seen on CT A/P with contrast 10/13/2020. 6. Small right renal stones measuring up to 4 mm on CT A/P 10/13/2020  -Continue Vanco and Zosyn -PCCM called IR to place right perinephric drain into abscess.  He may require multiple drainage in order to adequately drain the fluid collection. -We will obtain CT noncontrast study in the interval fashion to evaluate for multiple bilateral renal hypodense lesions -He does have quite small right nonobstructing renal stones measuring up to 4 mm.  At this time, no need for management of these small nonobstructing stones. -Following  Jannifer Hick 10/13/2020, 6:28 PM  Matt R. Kyannah Climer MD [facility]  Pager: [phone]   CC: Levon Hedger, MD

## 2020-10-13 NOTE — Progress Notes (Signed)
NAME:  Mark May, MRN:  [id number], DOB:  [birth year], LOS: 8 ADMISSION DATE:  2020/10/07, CONSULTATION DATE: 07-Oct-2020 REFERRING MD: Dr. Curtis Sites, CHIEF COMPLAINT: Left-sided weakness  Brief History   64 year old male with hypertension and hyperlipidemia who presented with left-sided weakness, noted to have right MCA occlusion status post thrombectomy and stent placement 11/5.  Hospitalization complicated by hypoxic respiratory failure secondary to pulmonary edema requiring BiPAP and lasix.  Awaiting CIR placement however on the evening of 10/10, he was noted to become more lethargic with tachycardia and tachypnea.  Had complained of abdominal pain with some distention and chest pain which resolved after burping.  KUB obtained which was normal.  On 11/11, some what more responsive but now with increasing sCr in which lisinopril was stopped and development of fever 102.6  TRH initially consulted for help with medical management however on their evaluation, PCCM consulted given ill appearance and developing hypotension and new onset Afib with RVR.  Past Medical History  Hypertension Peptic ulcer disease Gout BPH Sciatica   Significant Hospital Events   11/5 Mechanical thrombectomy of right MCA and stent 11/5 11/11 PCCM reconsulted   Consults:  IR Neurology PCCM 11/5- 11/8; 11/11 [facility]  Procedures:  11/6 right femoral sheath was removed  Significant Diagnostic Tests:  11/5 CT head: No acute abnormality. Minimal diffuse cerebral and cerebellar atrophy. Mild chronic small vessel white matter ischemic changes in both cerebral hemispheres  11/5 CTA head and neck: Severe distal right M1 stenosis or subocclusive thrombus, Right MCA infarct with extensive penumbra, 4 mm right supraclinoid ICA aneurysm. Widely patent cervical carotid and vertebral arteries.  11/5 postprocedure CT head: Hypodensity in the right lateral temporal lobe now shows mild hemorrhage or contrast enhancement. This is  most consistent with acute infarct. There has been interval stenting of the right middle cerebral artery.  11/6 MRI brain: 1. Fairly extensive patchy acute/early subacute infarcts within the right MCA/watershed territory.  2. Additional small acute/early subacute infarcts within the dorsal right thalamus and left basal ganglia. 3. Background moderate chronic small vessel ischemic disease.  11/6  MRA head: 1. Interval stenting of the M1/M2 right middle cerebral artery. Flow related signal is present proximal and distal to the stent suggestive of stent patency. 2. 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  11/5 DG Abd 1 View Gastric catheter within the stomach kinked at the proximal side port. This could be withdrawn slightly S necessary.   11/6 Transthoracic Echocardiogram  1. Left ventricular ejection fraction, by estimation, is 70 to 75%. The left ventricle has hyperdynamic function. The left ventricle has no regional wall motion abnormalities. Left ventricular diastolic parameters are consistent with Grade II diastolic dysfunction (pseudonormalization). Elevated left atrial pressure.  2. Right ventricular systolic function is normal. The right ventricular size is normal.  3. The mitral valve is normal in structure. Mild mitral valve regurgitation. No evidence of mitral stenosis.  4. The aortic valve is normal in structure. Aortic valve regurgitation is not visualized. No aortic stenosis is present.  5. The inferior vena cava is dilated in size with <50% respiratory variability, suggesting right atrial pressure of 15 mmHg.  11/10 KUB >> normal gas pattern  Micro Data:  11/5 MRSA PCR negative 11/10 UC >> 11/11 BCx2 >>  Antimicrobials:  11/5 cefazolin 11/11 Zosyn >  Interim history/subjective:  More awake this AM. C/o pain along R ASIS, eval'd by night team who added vanc. Remains in sinus rhythm.  Objective  Blood pressure 128/75,  pulse (!) 114, temperature 98 F (36.7 C), temperature source Oral, resp. rate (!) 35, weight 65 kg, SpO2 94 %.        Intake/Output Summary (Last 24 hours) at 10/13/2020 0926 Last data filed at 10/13/2020 0800 Gross per 24 hour  Intake 2719.8 ml  Output --  Net 2719.8 ml   Filed Weights   10/12/20 0500 10/13/20 0414  Weight: 56.5 kg 65 kg    Examination:   Constitutional: mildly tachypneic  Eyes: tracking, pupils equal Ears, nose, mouth, and throat: NGT in place, MM dry Cardiovascular: RRR, tachycardic, ext warm Respiratory: clear, + accessory muscle use Gastrointestinal: soft, +BS Skin: skin overlying R hip with redness with mild induration, TTP Neurologic: L hemiparesis Psychiatric: RASS 0   Cr improved Pct elevated CBC benign CXR left basilar PNA vs. 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 and right MCA stent placement by IR with TICI 3- persistent left hemiplegia Acute diastolic congestive heart failure Hypertension Hyperlipidemia New R hip pain, redness- question if this related to source of ongoing sepsis  - Hold further IVF - Switch amio to PO - Vanc/zosyn, f/u culture data - PICC line due to poor access - Check CT A/P for anything underlying this severe R flank pain/redness - TF by cortrak - Try to keep awake during day, asleep at night - Avoid sedating agents - Avoid nephrotoxins, watch Uop, at risk for volume overload - Will follow with you, do not see role for ICU at present time but he is certainly at risk  Myrla Halsted MD PCCM

## 2020-10-13 NOTE — Progress Notes (Signed)
[facility]  PROGRESS NOTE  Bary CastillaLi Fei Portela UJW:[id number] DOB: 05/07/1956 DOA: 10/10/2020 PCP: Reubin MilanBerglund, Laura H, MD   Brief HPI:   64 year old male with medical history of hypertension, hyperlipidemia, stomach ulcer surgery who [facility]2020 with left-sided weakness, noted to have right MCA occlusion s/p thrombectomy and stent placement on 10/21/2020.  Patient hospitalization was complicated by hypoxic respiratory failure due to pulmonary edema requiring BiPAP and Lasix.  He was awaiting ECF placement however on 10/10/2020 he became more lethargic and tachycardic with tachypnea.  Patient went into A. fib with RVR ,also had a fever.  Due to concern for severe sepsis, PCCM was consulted.  Patient found to have sepsis with right lower lobe pneumonia and likely aspiration.  Started on IV Zosyn.  Also started on amiodarone and heparin gtt.    Subjective   Patient seen and examined, complains of abdominal pain this morning also noted to have skin rash over right lower quadrant.  CT abdomen/pelvis ordered by PCCM.   Assessment/Plan:     1. Sepsis due to pneumonia-?  Aspiration.  Patient started on IV Zosyn.  PCCM following. 2. Right lower quadrant pain-patient has tenderness in right lower quadrant with guarding, erythematous rash noted in right lower quadrant.  CT abdomen/pelvis ordered. 3. Dysphagia-patient is high risk for aspiration, is unable to consume adequate calories.  Speech therapist discussed with patient and his wife, he agrees for core track feeding tube placement for short duration.  Core track feeding tube is in place will order core track feeding tube.  Patient receiving tube feedings. 4. Paroxysmal atrial fibrillation with RVR-heart rate is controlled, continue heparin GTT, amiodarone infusion. 5. 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 6. Hypertension-Lopressor on hold due  to hypotension 7. Acute kidney injury-patient baseline creatinine is around 0.8-1, creatinine has been steadily rising.  Today creatinine is 1.40 likely from prerenal azotemia.  Continue LR at 100 mill per hour.  Follow BMP in am. 8. Hypokalemia-potassium is 3.1, will replace potassium and follow BMP in am. 9. Diabetes mellitus type 2-CBG well controlled, continue sliding scale insulin NovoLog.     COVID-19 Labs   Recent Labs    10/11/20 1746  DDIMER 9.78*    Lab Results  Component Value Date   SARSCOV2NAA NEGATIVE 02/04/2020     Scheduled medications:   .  stroke: mapping our early stages of recovery book   Does not apply Once  . [START ON 10/20/2020] amiodarone  200 mg Per Tube Daily   Followed by  . amiodarone  200 mg Per Tube BID  . chlorhexidine  15 mL Mouth Rinse BID  . Chlorhexidine Gluconate Cloth  6 each Topical Daily  . insulin aspart  0-9 Units Subcutaneous Q4H  . mouth rinse  15 mL Mouth Rinse q12n4p  . sodium phosphate  1 enema Rectal Once  . ticagrelor  90 mg Oral BID   Or  . ticagrelor  90 mg Per Tube BID         CBG: Recent Labs  Lab 10/13/20 0004 10/13/20 0319 10/13/20 0428 10/13/20 0734 10/13/20 1106  GLUCAP 152* 169* 149* 191* 160*    SpO2: 97 % O2 Flow Rate (L/min): 6 L/min FiO2 (%): 50 %    CBC: Recent Labs  Lab 10/09/20 0329 10/10/20 0259 10/11/20 1706 10/12/20 0314 10/13/20 1009  WBC 6.5 5.1 5.4 5.8 9.4  NEUTROABS  --   --  4.8  --   --   HGB 14.6 13.8 12.4* 11.7* 11.4*  HCT 43.2 41.0 36.7* 34.3* 32.4*  MCV 89.1 88.7 87.0 86.0 84.8  PLT 199 203 218 202 222    Basic Metabolic Panel: Recent Labs  Lab 10/08/20 0727 10/09/20 0329 10/10/20 0259 10/11/20 0915 10/11/20 1706 10/11/20 1746 10/12/20 0314 10/12/20 1814 10/13/20 1009  NA 139   < > 137 139 140  --  143  --  143  K 3.6   < > 3.4* 4.1 3.6  --  3.8  --  3.1*  CL 101   < > 105 111 110  --  113*  --  111  CO2 26   < > 23 19* 18*  --  19*  --  20*  GLUCOSE  130*   < > 165* 248* 155*  --  132*  --  194*  BUN 18   < > 36* 47* 47*  --  42*  --  43*  CREATININE 1.39*   < > 1.69* 1.96* 1.86*  --  1.61*  --  1.40*  CALCIUM 7.8*   < > 8.1* 7.7* 8.1*  --  8.0*  --  7.5*  MG 1.8  --   --   --   --  2.3  --  2.2 2.1  PHOS 4.9*  --   --   --  1.6*  --   --  2.9 2.6   < > = values in this interval not displayed.     Liver Function Tests: Recent Labs  Lab 10/08/20 0727 10/11/20 1706 10/13/20 1009  AST 53* 101* 105*  ALT 38 45* 60*  ALKPHOS 121 96 163*  BILITOT 2.4* 1.3* 0.8  PROT 5.9* 5.3* 5.0*  ALBUMIN 2.6* 1.6* 1.4*     Antibiotics: Anti-infectives (From admission, onward)   Start     Dose/Rate Route Frequency Ordered Stop   10/14/20 0400  vancomycin (VANCOREADY) IVPB 500 mg/100 mL        500 mg 100 mL/hr over 60 Minutes Intravenous Every 24 hours 10/13/20 0233     10/13/20 0330  vancomycin (VANCOREADY) IVPB 750 mg/150 mL        750 mg 150 mL/hr over 60 Minutes Intravenous  Once 10/13/20 0233 10/13/20 0439   10/12/20 1800  ceFEPIme (MAXIPIME) 2 g in sodium chloride 0.9 % 100 mL IVPB  Status:  Discontinued        2 g 200 mL/hr over 30 Minutes Intravenous Every 24 hours 10/11/20 1718 10/11/20 1742   10/12/20 1800  vancomycin (VANCOREADY) IVPB 750 mg/150 mL  Status:  Discontinued        750 mg 150 mL/hr over 60 Minutes Intravenous Every 24 hours 10/11/20 1724 10/11/20 1758   10/12/20 0200  metroNIDAZOLE (FLAGYL) tablet 500 mg  Status:  Discontinued        500 mg Oral Every 8 hours 10/11/20 1715 10/11/20 1758   10/12/20 0030  piperacillin-tazobactam (ZOSYN) IVPB 3.375 g        3.375 g 12.5 mL/hr over 240 Minutes Intravenous Every 8 hours 10/11/20 1805     10/11/20 1830  piperacillin-tazobactam (ZOSYN) IVPB 3.375 g        3.375 g 100 mL/hr over 30 Minutes Intravenous  Once 10/11/20 1805 10/11/20 1839   10/11/20 1715  ceFEPIme (MAXIPIME) 2 g in sodium chloride 0.9 % 100 mL IVPB  Status:  Discontinued        2 g 200 mL/hr  over 30 Minutes  Intravenous STAT 10/11/20 1710 10/11/20 1742   10/11/20 1715  metroNIDAZOLE (FLAGYL) IVPB 500 mg  Status:  Discontinued        500 mg 100 mL/hr over 60 Minutes Intravenous STAT 10/11/20 1710 10/11/20 1807   10/11/20 1715  vancomycin (VANCOCIN) IVPB 1000 mg/200 mL premix  Status:  Discontinued        1,000 mg 200 mL/hr over 60 Minutes Intravenous STAT 10/11/20 1710 10/11/20 1804   10/01/2020 1541  ceFAZolin (ANCEF) 2-4 GM/100ML-% IVPB       Note to Pharmacy: [facility]   : cabinet override      10/30/2020 1541 10/06/20 0344       DVT prophylaxis: Heparin     Objective   Vitals:   10/13/20 1300 10/13/20 1303 10/13/20 1400 10/13/20 1500  BP:  (!) 163/101    Pulse:  (!) 110    Resp: (!) 37 (!) 27 (!) 42 (!) 37  Temp:  98.2 F (36.8 C)    TempSrc:  Oral    SpO2: 94% 94% 95% 97%  Weight:        Intake/Output Summary (Last 24 hours) at 10/13/2020 1613 Last data filed at 10/13/2020 1500 Gross per 24 hour  Intake 2941.9 ml  Output --  Net 2941.9 ml    11/11 1901 - 11/13 0700 In: 3751.8 [I.V.:2867.9] Out: 800 [Urine:800]  Filed Weights   10/12/20 0500 10/13/20 0414  Weight: 56.5 kg 65 kg    Physical Examination:    General-appears in no acute distress  Heart-S1-S2, regular, no murmur auscultated  Lungs-clear to auscultation bilaterally, no wheezing or crackles auscultated  Abdomen-soft, nontender, no organomegaly  Extremities-no edema in the lower extremities  Neuro-alert, oriented x3, left hemiplegia        Data Reviewed:   Recent Results (from the past 240 hour(s))  Respiratory Panel by RT PCR (Flu A&B, Covid) - Nasopharyngeal Swab     Status: None   Collection Time: 10/11/2020  2:44 PM   Specimen: Nasopharyngeal Swab  Result Value Ref Range Status   SARS Coronavirus 2 by RT PCR NEGATIVE NEGATIVE Final    Comment: (NOTE) SARS-CoV-2 target nucleic acids are NOT DETECTED.  The SARS-CoV-2 RNA is generally detectable in upper respiratoy specimens  during the acute phase of infection. The lowest concentration of SARS-CoV-2 viral copies this assay can detect is 131 copies/mL. A negative result does not preclude SARS-Cov-2 infection and should not be used as the sole basis for treatment or other patient management decisions. A negative result may occur with  improper specimen collection/handling, submission of specimen other than nasopharyngeal swab, presence of viral mutation(s) within the areas targeted by this assay, and inadequate number of viral copies (<131 copies/mL). A negative result must be combined with clinical observations, patient history, and epidemiological information. The expected result is Negative.  Fact Sheet for Patients:  https://www.moore.com/  Fact Sheet for Healthcare Providers:  https://www.young.biz/  This test is no t yet approved or cleared by the [place] FDA and  has been authorized for detection and/or diagnosis of SARS-CoV-2 by FDA under an Emergency Use Authorization (EUA). This EUA will remain  in effect (meaning this test can be used) for the duration of the COVID-19 declaration under Section 564(b)(1) of the Act, 21 U.S.C. section 360bbb-3(b)(1), unless the authorization is terminated or revoked sooner.     Influenza A by PCR NEGATIVE NEGATIVE Final   Influenza B by PCR NEGATIVE NEGATIVE Final  Comment: (NOTE) The Xpert Xpress SARS-CoV-2/FLU/RSV assay is intended as an aid in  the diagnosis of influenza from Nasopharyngeal swab specimens and  should not be used as a sole basis for treatment. Nasal washings and  aspirates are unacceptable for Xpert Xpress SARS-CoV-2/FLU/RSV  testing.  Fact Sheet for Patients: https://www.moore.com/  Fact Sheet for Healthcare Providers: https://www.young.biz/  This test is not yet approved or cleared by the [place] FDA and  has been authorized for detection and/or  diagnosis of SARS-CoV-2 by  FDA under an Emergency Use Authorization (EUA). This EUA will remain  in effect (meaning this test can be used) for the duration of the  Covid-19 declaration under Section 564(b)(1) of the Act, 21  U.S.C. section 360bbb-3(b)(1), unless the authorization is  terminated or revoked. Performed at [facility], [address] [place], [place] [address]   MRSA PCR Screening     Status: None   Collection Time: Oct 06, 2020  6:34 PM   Specimen: Nasal Mucosa; Nasopharyngeal  Result Value Ref Range Status   MRSA by PCR NEGATIVE NEGATIVE Final    Comment:        The GeneXpert MRSA Assay (FDA approved for NASAL specimens only), is one component of a comprehensive MRSA colonization surveillance program. It is not intended to diagnose MRSA infection nor to guide or monitor treatment for MRSA infections. Performed at [facility], [address] [place], [place] [address]   Culture, Urine     Status: None   Collection Time: 10/10/20  5:13 PM   Specimen: Urine, Catheterized  Result Value Ref Range Status   Specimen Description URINE, CATHETERIZED  Final   Special Requests NONE  Final   Culture   Final    NO GROWTH Performed at [facility], [address], [place], [place] [address]    Report Status 10/11/2020 FINAL  Final  Culture, blood (x 2)     Status: None (Preliminary result)   Collection Time: 10/11/20  7:13 PM   Specimen: BLOOD  Result Value Ref Range Status   Specimen Description BLOOD SITE NOT SPECIFIED  Final   Special Requests   Final    BOTTLES DRAWN AEROBIC AND ANAEROBIC Blood Culture results may not be optimal due to an inadequate volume of blood received in culture bottles   Culture   Final    NO GROWTH 2 DAYS Performed at [facility], [address], [place], [place] [address]    Report Status PENDING  Incomplete  Culture, blood (x 2)     Status: None (Preliminary result)   Collection Time: 10/11/20  7:15 PM    Specimen: BLOOD  Result Value Ref Range Status   Specimen Description BLOOD SITE NOT SPECIFIED  Final   Special Requests   Final    BOTTLES DRAWN AEROBIC AND ANAEROBIC Blood Culture adequate volume   Culture   Final    NO GROWTH 2 DAYS Performed at [facility], [address], [place], [place] [address]    Report Status PENDING  Incomplete  Culture, blood (routine x 2)     Status: None (Preliminary result)   Collection Time: 10/13/20  3:01 AM   Specimen: BLOOD LEFT HAND  Result Value Ref Range Status   Specimen Description BLOOD LEFT HAND  Final   Special Requests   Final    AEROBIC BOTTLE ONLY Blood Culture results may not be optimal due to an inadequate volume of blood received in culture  bottles   Culture   Final    NO GROWTH < 12 HOURS Performed at [facility], [address] [place], [place] [address]    Report Status PENDING  Incomplete    No results for input(s): LIPASE, AMYLASE in the last 168 hours. No results for input(s): AMMONIA in the last 168 hours.  Cardiac Enzymes: Recent Labs  Lab 10/13/20 1009  CKTOTAL 991*   BNP (last 3 results) No results for input(s): BNP in the last 8760 hours.  ProBNP (last 3 results) No results for input(s): PROBNP in the last 8760 hours.  Studies:  DG Abd 1 View  Result Date: 10/13/2020 CLINICAL DATA:  Abdominal distension EXAM: ABDOMEN - 1 VIEW COMPARISON:  October 10, 2020 FINDINGS: Flocculated enteric contrast is seen within the colon. Moderate predominately RIGHT-sided colonic stool burden. Gaseous distension of loops of colon without dilation. Feeding tube tip terminates over the distal stomach. Probable nephrolithiasis. Visualized osseous structures are unremarkable. IMPRESSION: Feeding tube tip terminates over the distal stomach. Electronically Signed   By: Meda Klinefelter MD   On: 10/13/2020 14:30   US RENAL  Result Date: 10/11/2020 CLINICAL DATA:  Acute renal insufficiency EXAM: RENAL / URINARY TRACT  ULTRASOUND COMPLETE COMPARISON:  None. FINDINGS: Right Kidney: Renal measurements: 10.5 x 5.4 x 4.9 cm = volume: 145 mL. Echogenicity within normal limits. No mass or hydronephrosis visualized. 1.8 cm simple exophytic cortical cyst noted. Left Kidney: Renal measurements: 11.4 x 6.2 x 4.8 cm = volume: 176 mL. Echogenicity within normal limits. No mass or hydronephrosis visualized. 2.7 cm exophytic simple cortical cyst noted. Bladder: Decompressed and not well visualized. Other: None. IMPRESSION: Normal renal sonogram Electronically Signed   By: Helyn Numbers MD   On: 10/11/2020 21:01   DG Chest Port 1 View  Result Date: 10/13/2020 CLINICAL DATA:  Respiratory failure. EXAM: PORTABLE CHEST 1 VIEW COMPARISON:  09/10/2020 FINDINGS: 0515 hours. Low lung volumes. Retrocardiac left base collapse/consolidation again noted with diffuse interstitial and bilateral airspace disease. Small bilateral effusions persist. A feeding tube passes into the stomach although the distal tip position is not included on the film. Telemetry leads overlie the chest. IMPRESSION: Low volume film with persistent retrocardiac left base collapse/consolidation and diffuse bilateral airspace disease. Electronically Signed   By: Kennith Center M.D.   On: 10/13/2020 08:12   DG Chest Port 1 View  Result Date: 10/11/2020 CLINICAL DATA:  Sepsis EXAM: PORTABLE CHEST 1 VIEW COMPARISON:  10/09/2020 FINDINGS: Two frontal views of the chest demonstrate an enlarged cardiac silhouette. Lung volumes are diminished, with new left basilar consolidation. Small bilateral pleural effusions are identified. No pneumothorax. IMPRESSION: 1. New left basilar consolidation which could reflect atelectasis or pneumonia. 2. New small bilateral pleural effusions, left greater than right. Electronically Signed   By: Sharlet Salina M.D.   On: 10/11/2020 17:46   [place] EKG SITE RITE  Result Date: 10/13/2020 If Site Rite image not attached, placement could not be  confirmed due to current cardiac rhythm.      Meredeth Ide   [facility] If 7PM-7AM, please contact night-coverage at [email or web address], Office  [phone]   10/13/2020, 4:13 PM  LOS: 8 days

## 2020-10-13 NOTE — Progress Notes (Addendum)
MEWS score driven by HR, RR, and Temp. Elink on call was notified. No new orders. PRN tylenol was adminstered for Temp control.

## 2020-10-13 NOTE — Progress Notes (Signed)
eLink Physician-Brief Progress Note Patient Name: Mark May DOB: Mar 20, 1956 MRN: [id number]   Date of Service  10/13/2020  HPI/Events of Note  Nursing concerned about area of redness, swelling and tenderness in RUQ of abdomen. Patient not getting  Lovenox or Heparin. Patient is currently on a Heparin IV infusion. I am not able to adequately assess this finding from eLink. Last Hgb = 11.2.   eICU Interventions  Plan: 1. Will request ground team evaluate the patient at bedside.      Intervention Category Major Interventions: Other:  [name] 10/13/2020, 1:55 AM

## 2020-10-14 ENCOUNTER — Encounter (HOSPITAL_COMMUNITY): Payer: Self-pay | Admitting: Neurology

## 2020-10-14 ENCOUNTER — Inpatient Hospital Stay (HOSPITAL_COMMUNITY): Payer: 59

## 2020-10-14 DIAGNOSIS — I10 Essential (primary) hypertension: Secondary | ICD-10-CM | POA: Diagnosis not present

## 2020-10-14 DIAGNOSIS — I4891 Unspecified atrial fibrillation: Secondary | ICD-10-CM | POA: Diagnosis not present

## 2020-10-14 DIAGNOSIS — I63131 Cerebral infarction due to embolism of right carotid artery: Secondary | ICD-10-CM | POA: Diagnosis not present

## 2020-10-14 DIAGNOSIS — I6601 Occlusion and stenosis of right middle cerebral artery: Secondary | ICD-10-CM | POA: Diagnosis not present

## 2020-10-14 DIAGNOSIS — N179 Acute kidney failure, unspecified: Secondary | ICD-10-CM | POA: Diagnosis not present

## 2020-10-14 LAB — BASIC METABOLIC PANEL
Anion gap: 10 (ref 5–15)
BUN: 42 mg/dL — ABNORMAL HIGH (ref 8–23)
CO2: 22 mmol/L (ref 22–32)
Calcium: 7.5 mg/dL — ABNORMAL LOW (ref 8.9–10.3)
Chloride: 113 mmol/L — ABNORMAL HIGH (ref 98–111)
Creatinine, Ser: 1.35 mg/dL — ABNORMAL HIGH (ref 0.61–1.24)
GFR, Estimated: 58 mL/min — ABNORMAL LOW (ref >60)
Glucose, Bld: 151 mg/dL — ABNORMAL HIGH (ref 70–99)
Potassium: 3.5 mmol/L (ref 3.5–5.1)
Sodium: 145 mmol/L (ref 135–145)

## 2020-10-14 LAB — MAGNESIUM: Magnesium: 2.2 mg/dL (ref 1.7–2.4)

## 2020-10-14 LAB — GLUCOSE, CAPILLARY
Glucose-Capillary: 130 mg/dL — ABNORMAL HIGH (ref 70–99)
Glucose-Capillary: 129 mg/dL — ABNORMAL HIGH (ref 70–99)
Glucose-Capillary: 119 mg/dL — ABNORMAL HIGH (ref 70–99)
Glucose-Capillary: 134 mg/dL — ABNORMAL HIGH (ref 70–99)
Glucose-Capillary: 118 mg/dL — ABNORMAL HIGH (ref 70–99)
Glucose-Capillary: 141 mg/dL — ABNORMAL HIGH (ref 70–99)

## 2020-10-14 LAB — CBC
HCT: 30.1 % — ABNORMAL LOW (ref 39.0–52.0)
Hemoglobin: 10.7 g/dL — ABNORMAL LOW (ref 13.0–17.0)
MCH: 29.4 pg (ref 26.0–34.0)
MCHC: 35.5 g/dL (ref 30.0–36.0)
MCV: 82.7 fL (ref 80.0–100.0)
Platelets: 197 10*3/uL (ref 150–400)
RBC: 3.64 MIL/uL — ABNORMAL LOW (ref 4.22–5.81)
RDW: 13.9 % (ref 11.5–15.5)
WBC: 9.7 10*3/uL (ref 4.0–10.5)
nRBC: 0 % (ref 0.0–0.2)

## 2020-10-14 LAB — HEPARIN LEVEL (UNFRACTIONATED)
Heparin Unfractionated: <0.1 IU/mL — ABNORMAL LOW (ref 0.30–0.70)
Heparin Unfractionated: 0.24 IU/mL — ABNORMAL LOW (ref 0.30–0.70)

## 2020-10-14 LAB — PHOSPHORUS: Phosphorus: 2.8 mg/dL (ref 2.5–4.6)

## 2020-10-14 MED ORDER — FENTANYL CITRATE (PF) 100 MCG/2ML IJ SOLN
INTRAMUSCULAR | Status: AC
  Filled 2020-10-14: qty 4

## 2020-10-14 MED ORDER — MIDAZOLAM HCL 2 MG/2ML IJ SOLN
INTRAMUSCULAR | Status: AC | PRN
  Administered 2020-10-14 (×2): 1 mg via INTRAVENOUS

## 2020-10-14 MED ORDER — SODIUM CHLORIDE 0.9% FLUSH
5.0000 mL | Freq: Three times a day (TID) | INTRAVENOUS | Status: DC
  Administered 2020-10-14 – 2020-11-01 (×44): 5 mL
  Administered 2020-11-02: 10 mL
  Administered 2020-11-02 – 2020-11-04 (×5): 5 mL

## 2020-10-14 MED ORDER — MIDAZOLAM HCL 2 MG/2ML IJ SOLN
INTRAMUSCULAR | Status: AC
  Filled 2020-10-14: qty 6

## 2020-10-14 MED ORDER — FREE WATER
200.0000 mL | Status: DC
  Administered 2020-10-14 – 2020-10-17 (×16): 200 mL

## 2020-10-14 MED ORDER — LIDOCAINE HCL 1 % IJ SOLN
INTRAMUSCULAR | Status: AC
  Filled 2020-10-14: qty 20

## 2020-10-14 MED ORDER — HEPARIN (PORCINE) 25000 UT/250ML-% IV SOLN
900.0000 [IU]/h | INTRAVENOUS | Status: DC
  Administered 2020-10-14 (×2): 800 [IU]/h via INTRAVENOUS
  Filled 2020-10-14 (×2): qty 250

## 2020-10-14 MED ORDER — FENTANYL CITRATE (PF) 100 MCG/2ML IJ SOLN
INTRAMUSCULAR | Status: AC | PRN
Start: 2020-10-14 — End: 2020-10-14
  Administered 2020-10-14 (×2): 50 ug via INTRAVENOUS

## 2020-10-14 NOTE — Progress Notes (Signed)
Pt back to room from procedure in CT. Pt drowsy and sleeping, telemetry remains intact and verified with CCMD, right flank JP tube tube intact and charged with 78ml output emptied, surgical dsg remains clean, dry and intact with no stain or drainage noted. VSS, family remains at bedside, will continue to closely monitor. Dionne Bucy RN   10/14/20 1232  Vital Signs  BP 127/71  BP Location Right Arm  Patient Position (if appropriate) Lying  BP Method Automatic  Pulse Rate (!) 101  Resp (!) 24  Temp 98.8 F (37.1 C)  Temp Source Oral  Oxygen Therapy  SpO2 94 %  O2 Device HFNC  O2 Flow Rate (L/min) 4 L/min

## 2020-10-14 NOTE — Procedures (Signed)
Interventional Radiology Procedure Note  Procedure: CT Guided Drainage of right retroperitoneal abscess  Complications: None  Estimated Blood Loss: < 10 mL  Findings: 12 Fr drain placed in right retroperitoneal abscess with return of reddish/brown fluid. Fluid sample sent for culture analysis. Drain attached to suction bulb drainage.  Will follow.  Jodi Marble. Fredia Sorrow, M.D Pager:  [phone]

## 2020-10-14 NOTE — Progress Notes (Signed)
9 Days Post-Op Subjective: Pain appears controlled. Afebrile.   Objective: Vital signs in last 24 hours: Temp:  [98 F (36.7 C)-99.3 F (37.4 C)] 99.3 F (37.4 C) (11/14 0955) Pulse Rate:  [93-114] 101 (11/14 0955) Resp:  [1-42] 16 (11/14 0955) BP: (131-163)/(76-101) 143/81 (11/14 0955) SpO2:  [94 %-98 %] 94 % (11/14 0955)  Intake/Output from previous day: 11/13 0701 - 11/14 0700 In: 960.1 [I.V.:304.8; NG/GT:100; IV Piggyback:555.3] Out: 1400 [Urine:1400] Intake/Output this shift: No intake/output data recorded.   UOP: 1.4L clear via condom cath  Physical Exam:  General: Alert and oriented CV: RRR Lungs: Clear Abdomen: Soft, ND, Tender over right abdomen and flank Ext: NT, No erythema  Lab Results: Recent Labs    10/12/20 0314 10/13/20 1009 10/14/20 0116  HGB 11.7* 11.4* 10.7*  HCT 34.3* 32.4* 30.1*   BMET Recent Labs    10/13/20 1009 10/14/20 0116  NA 143 145  K 3.1* 3.5  CL 111 113*  CO2 20* 22  GLUCOSE 194* 151*  BUN 43* 42*  CREATININE 1.40* 1.35*  CALCIUM 7.5* 7.5*     Studies/Results: CT ABDOMEN PELVIS WO CONTRAST  Result Date: 10/13/2020 CLINICAL DATA:  64 year old male with abdominal abscess or infection. EXAM: CT ABDOMEN AND PELVIS WITHOUT CONTRAST TECHNIQUE: Multidetector CT imaging of the abdomen and pelvis was performed following the standard protocol without IV contrast. COMPARISON:  Renal ultrasound dated 10/11/2020. FINDINGS: Evaluation of this exam is limited in the absence of intravenous contrast. Lower chest: Partially visualized small bilateral pleural effusions with complete consolidative changes of the visualized lower lobes. No intra-abdominal free air. There is a small free fluid in the pelvis. Hepatobiliary: The liver is unremarkable. No intrahepatic biliary dilatation. There are multiple stones in the gallbladder. No pericholecystic fluid or evidence of acute cholecystitis by CT. Pancreas: Unremarkable. No pancreatic ductal  dilatation or surrounding inflammatory changes. Spleen: Normal in size without focal abnormality. Adrenals/Urinary Tract: The adrenal glands unremarkable. Multiple nonobstructing right renal calculi measure up to 4 mm in the inferior pole of the right kidney. No hydronephrosis. There is no hydronephrosis or nephrolithiasis on the left. Multiple bilateral renal hypodense lesions are not characterized on this noncontrast CT. There is fluid collection with scattered pockets of air in the right perinephric space and along the right psoas muscle extending down into the pelvis. The fluid collection measures 7.4 x 4.3 cm in greatest axial dimensions and 17 cm in craniocaudal length. There is air within the urinary bladder, possibly related to recent instrumentation. An infectious process is not excluded. Stomach/Bowel: A feeding tube is noted with tip appearing in the distal stomach. Evaluation however is limited due to respiratory motion. Contrast is noted in the colon. There is distal colonic diverticulosis without active inflammatory changes. No bowel obstruction. The appendix is not visualized with certainty. Vascular/Lymphatic: Moderate aortoiliac atherosclerotic disease. The IVC is unremarkable. No portal venous gas. There is no adenopathy. Reproductive: The prostate and seminal vesicles are grossly unremarkable. Other: There is mild diffuse subcutaneous edema. No fluid collection. Musculoskeletal: No acute or significant osseous findings. IMPRESSION: 1. Large right perinephric fluid collection and pockets of air concerning for an infectious process/abscess. Clinical correlation is recommended. 2. Cholelithiasis. 3. Nonobstructing right renal calculi. No hydronephrosis. 4. Colonic diverticulosis. No bowel obstruction. 5. Partially visualized small bilateral pleural effusions with complete consolidative changes of the visualized lower lobes. 6. Aortic Atherosclerosis (ICD10-I70.0). Electronically Signed   By: Elgie Collard M.D.   On: 10/13/2020 16:42   DG Abd 1  View  Result Date: 10/13/2020 CLINICAL DATA:  Abdominal distension EXAM: ABDOMEN - 1 VIEW COMPARISON:  October 10, 2020 FINDINGS: Flocculated enteric contrast is seen within the colon. Moderate predominately RIGHT-sided colonic stool burden. Gaseous distension of loops of colon without dilation. Feeding tube tip terminates over the distal stomach. Probable nephrolithiasis. Visualized osseous structures are unremarkable. IMPRESSION: Feeding tube tip terminates over the distal stomach. Electronically Signed   By: Meda Klinefelter MD   On: 10/13/2020 14:30   DG Chest Port 1 View  Result Date: 10/13/2020 CLINICAL DATA:  Respiratory failure. EXAM: PORTABLE CHEST 1 VIEW COMPARISON:  09/10/2020 FINDINGS: 0515 hours. Low lung volumes. Retrocardiac left base collapse/consolidation again noted with diffuse interstitial and bilateral airspace disease. Small bilateral effusions persist. A feeding tube passes into the stomach although the distal tip position is not included on the film. Telemetry leads overlie the chest. IMPRESSION: Low volume film with persistent retrocardiac left base collapse/consolidation and diffuse bilateral airspace disease. Electronically Signed   By: Kennith Center M.D.   On: 10/13/2020 08:12   [place] EKG SITE RITE  Result Date: 10/13/2020 If Site Rite image not attached, placement could not be confirmed due to current cardiac rhythm.   Assessment/Plan: 1. Right perinephric abscess: Measures 7.4 x 4.3 x 17 cm along the right psoas down to the pelvis on CT A/P 10/13/2020.  Afebrile, WBC 9.4, urine culture 11/10 no growth. 2. Sepsis due to right lower lobe pneumonia and now perinephric abscess 3. AKI: Creatinine 1.4 on day of consultation from baseline of 0.8-1 4. 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  Hospitalization complicated by hypoxic respiratory failure due to pulmonary edema.  Also  complicated by A. fib with RVR.  mL sepsis due to right lower lobe pneumonia and aspiration and perinephric abscess as above. 5. Multiple bilateral hypodense renal lesions seen on CT A/P with contrast 10/13/2020. 6. Small right renal stones measuring up to 4 mm on CT A/P 10/13/2020  -Continue Vanco and Zosyn -IR to place abscess drain today. May require multiple in order to adequately drain fluid collection -F/u abscess cx -We will obtain interval CT noncontrast study to evaluate for multiple bilateral renal hypodense lesions -He does have quite small right nonobstructing renal stones measuring up to 4 mm.  At this time, no need for management of these small nonobstructing stones. -Following   LOS: 9 days   Jannifer Hick 10/14/2020, 10:11 AM Matt R. Aislee Landgren MD [facility]  Pager: [phone]

## 2020-10-14 NOTE — H&P (Signed)
Chief Complaint: Perinephric abscess  Referring Physician(s): Levon Hedger  Supervising Physician: Irish Lack  Patient Status: [facility] - In-pt  History of Present Illness: Mark May is a 64 y.o. 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    His hospitalization was complicated by hypoxic respiratory failure due to pulmonary edema, A. fib with RVR and sepsis due to right lower lobe pneumonia and likely aspiration.  He developed severe right flank pain yesterday.  CT scan showed= Large right perinephric fluid collection and pockets of air concerning for an infectious process/abscess.   We are asked to perform image guided drain placement.  He is NPO. Wife at bedside.  History taken from chart. Translator used to communicate with wife and she called her son who speaks [place] and translated as well.  Past Medical History:  Diagnosis Date  . Gout   . Hypertension     Past Surgical History:  Procedure Laterality Date  . PARTIAL GASTRECTOMY  1990   done in [place] for PUD  . PROSTATE BIOPSY  ~ 2005   negative for cancer (done in [place])  . RADIOLOGY WITH ANESTHESIA N/A 10/27/2020   Procedure: IR WITH ANESTHESIA;  Surgeon: Radiologist, Medication, MD;  Location: [facility] OR;  Service: Radiology;  Laterality: N/A;    Allergies: Patient has no known allergies.  Medications: Prior to Admission medications   Medication Sig Start Date End Date Taking? Authorizing Provider  allopurinol (ZYLOPRIM) 100 MG tablet Take 3 tablets (300 mg total) by mouth daily. Patient not taking: Reported on 10/09/2020 02/17/18   Reubin Milan, MD  amLODipine (NORVASC) 10 MG tablet Take 1 tablet (10 mg total) by mouth daily. Patient not taking: Reported on 10/09/2020 02/17/18   Reubin Milan, MD  tamsulosin [facility]) 0.4 MG CAPS capsule Take 1 capsule (0.4 mg total) by mouth daily after supper. Patient not taking: Reported on 10/09/2020 03/09/18    Reubin Milan, MD     Family History  Problem Relation Age of Onset  . Diabetes Mother   . Diabetes Father     Social History   Socioeconomic History  . Marital status: Married    Spouse name: Not on file  . Number of children: Not on file  . Years of education: Not on file  . Highest education level: Not on file  Occupational History  . Not on file  Tobacco Use  . Smoking status: Never Smoker  . Smokeless tobacco: Never Used  Vaping Use  . Vaping Use: Never used  Substance and Sexual Activity  . Alcohol use: No    Alcohol/week: 0.0 standard drinks  . Drug use: No  . Sexual activity: Not on file  Other Topics Concern  . Not on file  Social History Narrative  . Not on file   Social Determinants of Health   Financial Resource Strain:   . Difficulty of Paying Living Expenses: Not on file  Food Insecurity:   . Worried About Programme researcher, broadcasting/film/video in the Last Year: Not on file  . Ran Out of Food in the Last Year: Not on file  Transportation Needs:   . Lack of Transportation (Medical): Not on file  . Lack of Transportation (Non-Medical): Not on file  Physical Activity:   . Days of Exercise per Week: Not on file  . Minutes of Exercise per Session: Not on file  Stress:   . Feeling of Stress : Not on  file  Social Connections:   . Frequency of Communication with Friends and Family: Not on file  . Frequency of Social Gatherings with Friends and Family: Not on file  . Attends Religious Services: Not on file  . Active Member of Clubs or Organizations: Not on file  . Attends Banker Meetings: Not on file  . Marital Status: Not on file     Review of Systems: A 12 point ROS discussed and pertinent positives are indicated in the HPI above.  All other systems are negative.  Review of Systems  Vital Signs: BP (!) 144/83 (BP Location: Right Arm)   Pulse 99   Temp 98.1 F (36.7 C) (Oral)   Resp (!) 30   Wt 65 kg   SpO2 95%   BMI 22.44 kg/m    Physical Exam Vitals reviewed.  Constitutional:      Appearance: He is ill-appearing.  HENT:     Head: Normocephalic and atraumatic.  Eyes:     Extraocular Movements: Extraocular movements intact.  Cardiovascular:     Rate and Rhythm: Regular rhythm. Tachycardia present.  Pulmonary:     Effort: Pulmonary effort is normal. No respiratory distress.     Breath sounds: Normal breath sounds.     Comments: Tachypneic Abdominal:     Palpations: Abdomen is soft.     Tenderness: There is abdominal tenderness.  Musculoskeletal:        General: Normal range of motion.     Cervical back: Normal range of motion.  Skin:    General: Skin is warm and dry.  Neurological:     General: No focal deficit present.     Mental Status: He is alert and oriented to person, place, and time.  Psychiatric:        Mood and Affect: Mood normal.        Behavior: Behavior normal.        Thought Content: Thought content normal.        Judgment: Judgment normal.     Imaging: CT ABDOMEN PELVIS WO CONTRAST  Result Date: 10/13/2020 CLINICAL DATA:  64 year old male with abdominal abscess or infection. EXAM: CT ABDOMEN AND PELVIS WITHOUT CONTRAST TECHNIQUE: Multidetector CT imaging of the abdomen and pelvis was performed following the standard protocol without IV contrast. COMPARISON:  Renal ultrasound dated 10/11/2020. FINDINGS: Evaluation of this exam is limited in the absence of intravenous contrast. Lower chest: Partially visualized small bilateral pleural effusions with complete consolidative changes of the visualized lower lobes. No intra-abdominal free air. There is a small free fluid in the pelvis. Hepatobiliary: The liver is unremarkable. No intrahepatic biliary dilatation. There are multiple stones in the gallbladder. No pericholecystic fluid or evidence of acute cholecystitis by CT. Pancreas: Unremarkable. No pancreatic ductal dilatation or surrounding inflammatory changes. Spleen: Normal in size without  focal abnormality. Adrenals/Urinary Tract: The adrenal glands unremarkable. Multiple nonobstructing right renal calculi measure up to 4 mm in the inferior pole of the right kidney. No hydronephrosis. There is no hydronephrosis or nephrolithiasis on the left. Multiple bilateral renal hypodense lesions are not characterized on this noncontrast CT. There is fluid collection with scattered pockets of air in the right perinephric space and along the right psoas muscle extending down into the pelvis. The fluid collection measures 7.4 x 4.3 cm in greatest axial dimensions and 17 cm in craniocaudal length. There is air within the urinary bladder, possibly related to recent instrumentation. An infectious process is not excluded. Stomach/Bowel: A feeding tube is  noted with tip appearing in the distal stomach. Evaluation however is limited due to respiratory motion. Contrast is noted in the colon. There is distal colonic diverticulosis without active inflammatory changes. No bowel obstruction. The appendix is not visualized with certainty. Vascular/Lymphatic: Moderate aortoiliac atherosclerotic disease. The IVC is unremarkable. No portal venous gas. There is no adenopathy. Reproductive: The prostate and seminal vesicles are grossly unremarkable. Other: There is mild diffuse subcutaneous edema. No fluid collection. Musculoskeletal: No acute or significant osseous findings. IMPRESSION: 1. Large right perinephric fluid collection and pockets of air concerning for an infectious process/abscess. Clinical correlation is recommended. 2. Cholelithiasis. 3. Nonobstructing right renal calculi. No hydronephrosis. 4. Colonic diverticulosis. No bowel obstruction. 5. Partially visualized small bilateral pleural effusions with complete consolidative changes of the visualized lower lobes. 6. Aortic Atherosclerosis (ICD10-I70.0). Electronically Signed   By: Elgie CollardArash  Radparvar M.D.   On: 10/13/2020 16:42   CT Angio Head W or Wo  Contrast  Result Date: 10/20/2020 CLINICAL DATA:  Left-sided numbness and weakness and right gaze preference. EXAM: CT ANGIOGRAPHY HEAD AND NECK CT PERFUSION BRAIN TECHNIQUE: Multidetector CT imaging of the head and neck was performed using the standard protocol during bolus administration of intravenous contrast. Multiplanar CT image reconstructions and MIPs were obtained to evaluate the vascular anatomy. Carotid stenosis measurements (when applicable) are obtained utilizing NASCET criteria, using the distal internal carotid diameter as the denominator. Multiphase CT imaging of the brain was performed following IV bolus contrast injection. Subsequent parametric perfusion maps were calculated using RAPID software. CONTRAST:  100mL OMNIPAQUE IOHEXOL 350 MG/ML SOLN COMPARISON:  None. FINDINGS: CTA NECK FINDINGS Aortic arch: Normal variant aortic arch branching pattern with common origin of the brachiocephalic and left common carotid arteries. Mild arch atherosclerosis without arch vessel origin stenosis. Right carotid system: Patent without evidence of stenosis, dissection, or significant atherosclerosis. Left carotid system: Patent without evidence of stenosis, dissection, or significant atherosclerosis. Vertebral arteries: Patent without evidence of stenosis, dissection, or significant atherosclerosis. Strongly dominant left vertebral artery. Skeleton: Old medial left orbital fracture. 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 Chronic appearing left mastoid air cell opacification. Mild disc degeneration at C4-5. Other neck: No evidence of cervical lymphadenopathy or mass. Upper chest: Clear lung apices. Review of the MIP images confirms the above findings CTA HEAD FINDINGS Anterior circulation: The internal carotid arteries are patent from skull base to carotid termini with atherosclerotic plaque resulting in mild left cavernous segment stenosis. A posteriorly  projecting aneurysm arising from the distal right supraclinoid ICA measures 4 mm. ACAs and MCAs are patent without evidence of a significant A1 or left M1 stenosis. There is severe irregular narrowing of the distal right M1 segment over a length of 7 mm. No M2 occlusion is identified. Posterior circulation: The intracranial vertebral arteries are patent to the basilar. Patent AICA and SCA origins are seen bilaterally. The basilar artery is widely patent. There are small right and diminutive or absent left posterior communicating arteries. Both PCAs are patent without evidence of a significant proximal stenosis on the right. There is a moderate mid left P2 stenosis. No aneurysm is identified. Venous sinuses: As permitted by contrast timing, patent. Anatomic variants: None. Review of the MIP images confirms the above findings CT Brain Perfusion Findings: ASPECTS: 8 CBF (<30%) Volume: 20 mL Perfusion (Tmax>6.0s) volume: 151 mL Mismatch Volume: 131 mL Infarction Location: Core infarct in the right parietal and temporal lobes (with underlying chronic encephalomalacia in the anterior and lateral  right temporal lobe as well) with penumbra throughout the majority of the right MCA territory. IMPRESSION: 1. 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 2. Right MCA infarct with extensive penumbra. 3. 4 mm right supraclinoid ICA aneurysm. 4. Widely patent cervical carotid and vertebral arteries. 5. Aortic Atherosclerosis (ICD10-I70.0). These results were called by telephone at the time of interpretation on 10/16/2020 at 2:45 p.m. to Dr. Wilford Corner, who verbally acknowledged these results. Electronically Signed   By: Sebastian Ache M.D.   On: 10/03/2020 15:09   DG Chest 1 View  Result Date: 10/09/2020 CLINICAL DATA:  Chest pain. EXAM: CHEST  1 VIEW COMPARISON:  None. FINDINGS: The heart size and mediastinal contours are within normal limits. Both lungs are clear. The visualized skeletal structures are unremarkable.  Aortic calcifications are noted. IMPRESSION: No active disease. Electronically Signed   By: Katherine Mantle M.D.   On: 10/09/2020 19:52   DG Elbow 2 Views Left  Result Date: 10/07/2020 CLINICAL DATA:  Posterior left elbow swelling and decreased range of motion. Post stroke. No known injury. EXAM: LEFT ELBOW - 2 VIEW COMPARISON:  None. FINDINGS: Extended positioning on the lateral view precludes accurate assessment for joint effusion. No appreciable joint effusion. No fracture or dislocation. No focal osseous lesions. Small enthesophyte at the posterior olecranon. Soft tissue swelling overlying the olecranon. No significant arthropathy. No osseous erosions or periosteal reaction. IMPRESSION: Soft tissue swelling overlying the olecranon, suggesting olecranon bursitis. Small enthesophyte at the posterior olecranon. No acute osseous abnormality. Electronically Signed   By: Delbert Phenix M.D.   On: 10/07/2020 16:09   DG Abd 1 View  Result Date: 10/13/2020 CLINICAL DATA:  Abdominal distension EXAM: ABDOMEN - 1 VIEW COMPARISON:  October 10, 2020 FINDINGS: Flocculated enteric contrast is seen within the colon. Moderate predominately RIGHT-sided colonic stool burden. Gaseous distension of loops of colon without dilation. Feeding tube tip terminates over the distal stomach. Probable nephrolithiasis. Visualized osseous structures are unremarkable. IMPRESSION: Feeding tube tip terminates over the distal stomach. Electronically Signed   By: Meda Klinefelter MD   On: 10/13/2020 14:30   DG Abd 1 View  Result Date: 10/08/2020 CLINICAL DATA:  Check gastric catheter placement EXAM: ABDOMEN - 1 VIEW COMPARISON:  None. FINDINGS: Gastric catheter is noted extending into the stomach. It appears coiled upon itself within the distal stomach with kinking at the proximal side port. This could be withdrawn slightly as clinically indicated. Contrast from recent arteriography is noted. IMPRESSION: Gastric catheter within the  stomach kinked at the proximal side port. This could be withdrawn slightly S necessary. Electronically Signed   By: Alcide Clever M.D.   On: 10/28/2020 20:21   CT HEAD WO CONTRAST  Result Date: 10/06/2020 CLINICAL DATA:  Stroke follow-up.  Right MCA recanalization today. EXAM: CT HEAD WITHOUT CONTRAST TECHNIQUE: Contiguous axial images were obtained from the base of the skull through the vertex without intravenous contrast. COMPARISON:  CT head approximately 4 hours prior to the current study. FINDINGS: Brain: Right lateral temporal lobe hypodensity unchanged in size. Decrease hyperdensity within this likely due to resolving contrast staining rather than hemorrhage. Small area of hypodensity in the right parietal cortex has developed since the prior study compatible with acute infarct. Ventricle size normal. No midline shift. Mild chronic microvascular ischemic change in the white matter. Vascular: Right MCA stent.  Negative for hyperdense vessel Skull: Negative Sinuses/Orbits: Mucosal edema in the paranasal sinuses. The patient is intubated with an NG tube in place. Other: None IMPRESSION:  Right temporal lobe hypodensity compatible with acute infarct. Decreased hyperdensity within this stroke compared with 4 hours previously compatible with contrast staining rather than hemorrhage. Small area of acute infarct in the right parietal lobe now evident. Right MCA stent. Electronically Signed   By: Marlan Palau M.D.   On: October 16, 2020 22:16   CT HEAD WO CONTRAST  Result Date: 2020/10/16 CLINICAL DATA:  Stroke.  Post intervention. EXAM: CT HEAD WITHOUT CONTRAST TECHNIQUE: Contiguous axial images were obtained from the base of the skull through the vertex without intravenous contrast. COMPARISON:  CT angio head and neck 2020-10-16 FINDINGS: Brain: Hypodensity in the right lateral temporal lobe now shows mild hyperdensity which may be hemorrhage or contrast enhancement. If this is blood, this is a mild amount of  blood. This therefore likely was an area of acute infarct. There has been interval stenting of the right middle cerebral artery. No other areas of acute hemorrhage or infarct. Patchy white matter hypodensity bilaterally compatible chronic microvascular ischemia. Ventricle size normal. Vascular: Normal vascular enhancement following angiography. Skull: Negative Sinuses/Orbits: Mucosal edema paranasal sinuses with near complete opacification right maxillary sinus. Negative orbit Other: None IMPRESSION: Hypodensity in the right lateral temporal lobe now shows mild hemorrhage or contrast enhancement. This is most consistent with acute infarct. There has been interval stenting of the right middle cerebral artery. These results were called by telephone at the time of interpretation on 10-16-2020 at 6:28 pm to provider Bhagat, who verbally acknowledged these results. T Electronically Signed   By: Marlan Palau M.D.   On: 10-16-2020 18:26   CT HEAD WO CONTRAST  Result Date: 2020/10/16 CLINICAL DATA:  Dizziness. Left upper extremity weakness. Headache. EXAM: CT HEAD WITHOUT CONTRAST TECHNIQUE: Contiguous axial images were obtained from the base of the skull through the vertex without intravenous contrast. COMPARISON:  None. FINDINGS: Brain: Minimally enlarged ventricles and subarachnoid spaces. Mild patchy white matter low density in both cerebral hemispheres. No intracranial hemorrhage, mass lesion or CT evidence of acute infarction. Vascular: No hyperdense vessel or unexpected calcification. Skull: Normal. Negative for fracture or focal lesion. Sinuses/Orbits: Unremarkable. Other: None. IMPRESSION: 1. No acute abnormality. 2. Minimal diffuse cerebral and cerebellar atrophy. 3. Mild chronic small vessel white matter ischemic changes in both cerebral hemispheres. Electronically Signed   By: Beckie Salts M.D.   On: 10/16/20 13:01   CT Angio Neck W and/or Wo Contrast  Result Date: October 16, 2020 CLINICAL DATA:   Left-sided numbness and weakness and right gaze preference. EXAM: CT ANGIOGRAPHY HEAD AND NECK CT PERFUSION BRAIN TECHNIQUE: Multidetector CT imaging of the head and neck was performed using the standard protocol during bolus administration of intravenous contrast. Multiplanar CT image reconstructions and MIPs were obtained to evaluate the vascular anatomy. Carotid stenosis measurements (when applicable) are obtained utilizing NASCET criteria, using the distal internal carotid diameter as the denominator. Multiphase CT imaging of the brain was performed following IV bolus contrast injection. Subsequent parametric perfusion maps were calculated using RAPID software. CONTRAST:  OMNIPAQUE IOHEXOL 350 MG/ML SOLN COMPARISON:  None. FINDINGS: CTA NECK FINDINGS Aortic arch: Normal variant aortic arch branching pattern with common origin of the brachiocephalic and left common carotid arteries. Mild arch atherosclerosis without arch vessel origin stenosis. Right carotid system: Patent without evidence of stenosis, dissection, or significant atherosclerosis. Left carotid system: Patent without evidence of stenosis, dissection, or significant atherosclerosis. Vertebral arteries: Patent without evidence of stenosis, dissection, or significant atherosclerosis. Strongly dominant left vertebral artery. Skeleton: Old medial left orbital fracture. Chronic  or acute on chronic right maxillary sinusitis with near complete sinus opacification. Chronic appearing left mastoid air cell opacification. Mild disc degeneration at C4-5. Other neck: No evidence of cervical lymphadenopathy or mass. Upper chest: Clear lung apices. Review of the MIP images confirms the above findings CTA HEAD FINDINGS Anterior circulation: The internal carotid arteries are patent from skull base to carotid termini with atherosclerotic plaque resulting in mild left cavernous segment stenosis. A posteriorly projecting aneurysm arising from the distal right  supraclinoid ICA measures 4 mm. ACAs and MCAs are patent without evidence of a significant A1 or left M1 stenosis. There is severe irregular narrowing of the distal right M1 segment over a length of 7 mm. No M2 occlusion is identified. Posterior circulation: The intracranial vertebral arteries are patent to the basilar. Patent AICA and SCA origins are seen bilaterally. The basilar artery is widely patent. There are small right and diminutive or absent left posterior communicating arteries. Both PCAs are patent without evidence of a significant proximal stenosis on the right. There is a moderate mid left P2 stenosis. No aneurysm is identified. Venous sinuses: As permitted by contrast timing, patent. Anatomic variants: None. Review of the MIP images confirms the above findings CT Brain Perfusion Findings: ASPECTS: 8 CBF (<30%) Volume: 20 mL Perfusion (Tmax>6.0s) volume: 151 mL Mismatch Volume: 131 mL Infarction Location: Core infarct in the right parietal and temporal lobes (with underlying chronic encephalomalacia in the anterior and lateral right temporal lobe as well) with penumbra throughout the majority of the right MCA territory. IMPRESSION: 1. 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 2. Right MCA infarct with extensive penumbra. 3. 4 mm right supraclinoid ICA aneurysm. 4. Widely patent cervical carotid and vertebral arteries. 5. Aortic Atherosclerosis (ICD10-I70.0). These results were called by telephone at the time of interpretation on 10/03/2020 at 2:45 p.m. to Dr. Wilford Corner, who verbally acknowledged these results. Electronically Signed   By: Sebastian Ache M.D.   On: 10/07/2020 15:09   MR ANGIO HEAD WO CONTRAST  Result Date: 10/06/2020 CLINICAL DATA:  Stroke, follow-up. EXAM: MRI HEAD WITHOUT CONTRAST MRA HEAD WITHOUT CONTRAST TECHNIQUE: Multiplanar, multiecho pulse sequences of the brain and surrounding structures were obtained without intravenous contrast. Angiographic images of the head  were obtained using MRA technique without contrast. COMPARISON:  Noncontrast head CT 10/11/2020. CT head 10/23/2020 performed at 6:03 p.m. CT angiogram head/neck performed 10/03/2020. FINDINGS: MRI HEAD FINDINGS Brain: Cerebral volume is normal for age. There is fairly extensive patchy multifocal cortical and subcortical restricted diffusion within the right MCA and watershed territories consistent with acute/early subacute infarction. Acute infarcts are present within the right frontal, parietal, occipital and temporal lobes. Patchy small acute/early subacute infarcts are also present within the right basal ganglia. A subtle acute/early subacute infarct is also present within the posteromedial right thalamus. Superimposed subacute/chronic infarction within the right temporal lobe with cortical laminar necrosis at this site. Additional small acute/early subacute infarcts within the left basal ganglia. Corresponding T2/FLAIR hyperintensity at these sites. No significant mass effect. No midline shift Background moderate cerebral white matter chronic small vessel ischemic disease. Chronic right basal ganglia lacunar infarct. 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 No evidence of intracranial mass. No extra-axial fluid collection. No midline shift. Vascular: Reported below. Skull and upper cervical spine: No focal marrow lesion. Sinuses/Orbits: Visualized orbits show no acute finding. Complete T2 hyperintense opacification of the right maxillary sinus. Moderate ethmoid sinus mucosal thickening. Other: Left  mastoid effusion. MRA HEAD FINDINGS The intracranial internal carotid arteries are patent. Interval stenting of the M1/M2 right middle cerebral artery with associated stent artifact. There is flow related signal proximal and distal to the stent suggestive of stent patency. Flow related signal within M2 and  more distal right MCA branch vessels. The M1 left MCA is patent without stenosis. No left M2 proximal branch occlusion or high-grade proximal stenosis. 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 The intracranial vertebral arteries are patent. The basilar artery is patent. The posterior cerebral arteries are patent. Mild atherosclerotic narrowing of the proximal P2 left PCA (series [id number], image 19). IMPRESSION: MRI brain: 1. Fairly extensive patchy acute/early subacute infarcts within the right MCA/watershed territory. 2. Additional small acute/early subacute infarcts within the dorsal right thalamus and left basal ganglia. 3. Background moderate chronic small vessel ischemic disease. MRA head: 1. Interval stenting of the M1/M2 right middle cerebral artery. Flow related signal is present proximal and distal to the stent suggestive of stent patency. 2. 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 Electronically Signed   By: Jackey Loge DO   On: 10/06/2020 13:43   MR BRAIN WO CONTRAST  Result Date: 10/06/2020 CLINICAL DATA:  Stroke, follow-up. EXAM: MRI HEAD WITHOUT CONTRAST MRA HEAD WITHOUT CONTRAST TECHNIQUE: Multiplanar, multiecho pulse sequences of the brain and surrounding structures were obtained without intravenous contrast. Angiographic images of the head were obtained using MRA technique without contrast. COMPARISON:  Noncontrast head CT 10/10/2020. CT head 10/01/2020 performed at 6:03 p.m. CT angiogram head/neck performed 10/04/2020. FINDINGS: MRI HEAD FINDINGS Brain: Cerebral volume is normal for age. There is fairly extensive patchy multifocal cortical and subcortical restricted diffusion within the right MCA and watershed territories consistent with acute/early subacute infarction. Acute infarcts are present within the right frontal, parietal, occipital and temporal lobes. Patchy small acute/early subacute infarcts are also present  within the right basal ganglia. A subtle acute/early subacute infarct is also present within the posteromedial right thalamus. Superimposed subacute/chronic infarction within the right temporal lobe with cortical laminar necrosis at this site. Additional small acute/early subacute infarcts within the left basal ganglia. Corresponding T2/FLAIR hyperintensity at these sites. No significant mass effect. No midline shift Background moderate cerebral white matter chronic small vessel ischemic disease. Chronic right basal ganglia lacunar infarct. 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 No evidence of intracranial mass. No extra-axial fluid collection. No midline shift. Vascular: Reported below. Skull and upper cervical spine: No focal marrow lesion. Sinuses/Orbits: Visualized orbits show no acute finding. Complete T2 hyperintense opacification of the right maxillary sinus. Moderate ethmoid sinus mucosal thickening. Other: Left mastoid effusion. MRA HEAD FINDINGS The intracranial internal carotid arteries are patent. Interval stenting of the M1/M2 right middle cerebral artery with associated stent artifact. There is flow related signal proximal and distal to the stent suggestive of stent patency. Flow related signal within M2 and more distal right MCA branch vessels. The M1 left MCA is patent without stenosis. No left M2 proximal branch occlusion or high-grade proximal stenosis. 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 The intracranial vertebral arteries are patent. The basilar artery is patent. The posterior cerebral arteries are patent. Mild atherosclerotic narrowing of the proximal P2 left PCA (series [id number], image 19). IMPRESSION: MRI brain: 1. Fairly extensive patchy acute/early subacute infarcts within the right MCA/watershed territory. 2. Additional small acute/early  subacute infarcts within the dorsal  right thalamus and left basal ganglia. 3. Background moderate chronic small vessel ischemic disease. MRA head: 1. Interval stenting of the M1/M2 right middle cerebral artery. Flow related signal is present proximal and distal to the stent suggestive of stent patency. 2. 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 Electronically Signed   By: Jackey Loge DO   On: 10/06/2020 13:43   US RENAL  Result Date: 10/11/2020 CLINICAL DATA:  Acute renal insufficiency EXAM: RENAL / URINARY TRACT ULTRASOUND COMPLETE COMPARISON:  None. FINDINGS: Right Kidney: Renal measurements: 10.5 x 5.4 x 4.9 cm = volume: 145 mL. Echogenicity within normal limits. No mass or hydronephrosis visualized. 1.8 cm simple exophytic cortical cyst noted. Left Kidney: Renal measurements: 11.4 x 6.2 x 4.8 cm = volume: 176 mL. Echogenicity within normal limits. No mass or hydronephrosis visualized. 2.7 cm exophytic simple cortical cyst noted. Bladder: Decompressed and not well visualized. Other: None. IMPRESSION: Normal renal sonogram Electronically Signed   By: Helyn Numbers MD   On: 10/11/2020 21:01   CT CEREBRAL PERFUSION W CONTRAST  Result Date: 10/04/2020 CLINICAL DATA:  Left-sided numbness and weakness and right gaze preference. EXAM: CT ANGIOGRAPHY HEAD AND NECK CT PERFUSION BRAIN TECHNIQUE: Multidetector CT imaging of the head and neck was performed using the standard protocol during bolus administration of intravenous contrast. Multiplanar CT image reconstructions and MIPs were obtained to evaluate the vascular anatomy. Carotid stenosis measurements (when applicable) are obtained utilizing NASCET criteria, using the distal internal carotid diameter as the denominator. Multiphase CT imaging of the brain was performed following IV bolus contrast injection. Subsequent parametric perfusion maps were calculated using RAPID software. CONTRAST:  OMNIPAQUE IOHEXOL 350  MG/ML SOLN COMPARISON:  None. FINDINGS: CTA NECK FINDINGS Aortic arch: Normal variant aortic arch branching pattern with common origin of the brachiocephalic and left common carotid arteries. Mild arch atherosclerosis without arch vessel origin stenosis. Right carotid system: Patent without evidence of stenosis, dissection, or significant atherosclerosis. Left carotid system: Patent without evidence of stenosis, dissection, or significant atherosclerosis. Vertebral arteries: Patent without evidence of stenosis, dissection, or significant atherosclerosis. Strongly dominant left vertebral artery. Skeleton: Old medial left orbital fracture. 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 Chronic appearing left mastoid air cell opacification. Mild disc degeneration at C4-5. Other neck: No evidence of cervical lymphadenopathy or mass. Upper chest: Clear lung apices. Review of the MIP images confirms the above findings CTA HEAD FINDINGS Anterior circulation: The internal carotid arteries are patent from skull base to carotid termini with atherosclerotic plaque resulting in mild left cavernous segment stenosis. A posteriorly projecting aneurysm arising from the distal right supraclinoid ICA measures 4 mm. ACAs and MCAs are patent without evidence of a significant A1 or left M1 stenosis. There is severe irregular narrowing of the distal right M1 segment over a length of 7 mm. No M2 occlusion is identified. Posterior circulation: The intracranial vertebral arteries are patent to the basilar. Patent AICA and SCA origins are seen bilaterally. The basilar artery is widely patent. There are small right and diminutive or absent left posterior communicating arteries. Both PCAs are patent without evidence of a significant proximal stenosis on the right. There is a moderate mid left P2 stenosis. No aneurysm is identified. Venous sinuses: As permitted by contrast timing, patent. Anatomic  variants: None. Review of the MIP images confirms the above findings CT Brain Perfusion Findings: ASPECTS: 8 CBF (<30%) Volume: 20 mL Perfusion (Tmax>6.0s) volume: 151 mL Mismatch  Volume: 131 mL Infarction Location: Core infarct in the right parietal and temporal lobes (with underlying chronic encephalomalacia in the anterior and lateral right temporal lobe as well) with penumbra throughout the majority of the right MCA territory. IMPRESSION: 1. 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 2. Right MCA infarct with extensive penumbra. 3. 4 mm right supraclinoid ICA aneurysm. 4. Widely patent cervical carotid and vertebral arteries. 5. Aortic Atherosclerosis (ICD10-I70.0). These results were called by telephone at the time of interpretation on 18-Oct-2020 at 2:45 p.m. to Dr. Wilford Corner, who verbally acknowledged these results. Electronically Signed   By: Sebastian Ache M.D.   On: Oct 18, 2020 15:09   DG Chest Port 1 View  Result Date: 10/13/2020 CLINICAL DATA:  Respiratory failure. EXAM: PORTABLE CHEST 1 VIEW COMPARISON:  09/10/2020 FINDINGS: 0515 hours. Low lung volumes. Retrocardiac left base collapse/consolidation again noted with diffuse interstitial and bilateral airspace disease. Small bilateral effusions persist. A feeding tube passes into the stomach although the distal tip position is not included on the film. Telemetry leads overlie the chest. IMPRESSION: Low volume film with persistent retrocardiac left base collapse/consolidation and diffuse bilateral airspace disease. Electronically Signed   By: Kennith Center M.D.   On: 10/13/2020 08:12   DG Chest Port 1 View  Result Date: 10/11/2020 CLINICAL DATA:  Sepsis EXAM: PORTABLE CHEST 1 VIEW COMPARISON:  10/09/2020 FINDINGS: Two frontal views of the chest demonstrate an enlarged cardiac silhouette. Lung volumes are diminished, with new left basilar consolidation. Small bilateral pleural effusions are identified. No pneumothorax. IMPRESSION: 1. New  left basilar consolidation which could reflect atelectasis or pneumonia. 2. New small bilateral pleural effusions, left greater than right. Electronically Signed   By: Sharlet Salina M.D.   On: 10/11/2020 17:46   DG Abd Portable 1V  Result Date: 10/10/2020 CLINICAL DATA:  Abdominal distension EXAM: PORTABLE ABDOMEN - 1 VIEW COMPARISON:  10/07/2020 FINDINGS: Nasogastric tube is been removed. Contrast administered during a fluoroscopic examination of levin/8/21 is now seen throughout the colon and rectum which is nondilated. Normal abdominal gas pattern. No gross free intraperitoneal gas. Osseous structures are unremarkable. Previously noted renal calculi are obscured by intraluminal contrast. IMPRESSION: Normal abdominal gas pattern. Electronically Signed   By: Helyn Numbers MD   On: 10/10/2020 22:26   DG Abd Portable 1V  Result Date: 10/07/2020 CLINICAL DATA:  NG tube placement EXAM: PORTABLE ABDOMEN - 1 VIEW COMPARISON:  2020/10/18 FINDINGS: The enteric tube projects over the gastric body. The tube is likely kinked at the side hole. The bowel gas pattern is nonobstructive. There is probable bilateral nephrolithiasis. IMPRESSION: Enteric tube projects over the gastric body. The tube is likely kinked at the side hole. Probable bilateral nephrolithiasis. Electronically Signed   By: Katherine Mantle M.D.   On: 10/07/2020 22:50   DG Swallowing Func-Speech Pathology  Result Date: 10/08/2020 Completed by Royetta Crochet SLP student Supervised and reviewed by Harlon Ditty MA CCC-SLP Objective Swallowing Evaluation: Type of Study: MBS-Modified Barium Swallow Study  Patient Details Name: Jaquari Reckner MRN: [id number] Date of Birth: [birth year] Today's Date: 10/08/2020 Time: SLP Start Time (ACUTE ONLY): 1250 -SLP Stop Time (ACUTE ONLY): 1307 SLP Time Calculation (min) (ACUTE ONLY): 17 min Past Medical History: Past Medical History: Diagnosis Date . Gout  . Hypertension  Past Surgical History: Past Surgical  History: Procedure Laterality Date . PARTIAL GASTRECTOMY  1990  done in [place] for PUD . PROSTATE BIOPSY  ~ 2005  negative for cancer (done in [place]) .  RADIOLOGY WITH ANESTHESIA N/A 10/26/20  Procedure: IR WITH ANESTHESIA;  Surgeon: Radiologist, Medication, MD;  Location: [facility] OR;  Service: Radiology;  Laterality: N/A; HPI: Mark May is a 64 y.o. male with history of hypertension, BPH, sciatica, stomach ulcer status post surgery in the past presented to ER at [facility] for acute onset left-sided weakness, left facial droop and left hemianopia. CT head and neck showed right M1 high-grade stenosis versus near occlusive thrombosis. CT perfusion showed large penumbra. Patient transferred to [facility] for thrombectomy. Pt intubated 11/5-11/7 with post-extubation respiratory distress.  No data recorded Assessment / Plan / Recommendation CHL IP CLINICAL IMPRESSIONS 10/08/2020 Clinical Impression Pt was seen for MBS with son present for translating. MBS revealed a pharyngeal dysphagia characterized by silent aspiration, pharyngeal residue, and multiple swallows. Given pt's presentation during MBS, suspect he has pharyngeal weakness that is contributing to his dysphagia. Silent aspiration of thin liquid was observed before the pt had achieved full laryngeal closure. Pt's volitional cough was observed to be weak and unsuccessful. No other instances of aspiration were observed. Moderate amounts of pharyngeal residue were observed in the valleulae and pyriforms across POs. SLP educated pt in use of an effortful swallow, which was successful in reducing the amount of pharyngeal residue. Pt was also observed to utilize multiple swallows across POs. Recommend dys 3 diet with nectar thick liquid and use of effortful swallow. SLP will continue to f/u for diet toleration. SLP Visit Diagnosis Dysphagia, pharyngeal phase (R13.13) Attention and concentration deficit following -- Frontal lobe and executive function  deficit following -- Impact on safety and function Moderate aspiration risk   CHL IP TREATMENT RECOMMENDATION 10/08/2020 Treatment Recommendations Therapy as outlined in treatment plan below   Prognosis 10/08/2020 Prognosis for Safe Diet Advancement Good Barriers to Reach Goals -- Barriers/Prognosis Comment -- CHL IP DIET RECOMMENDATION 10/08/2020 SLP Diet Recommendations Dysphagia 3 (Mech soft) solids;Nectar thick liquid Liquid Administration via Cup;Straw Medication Administration Crushed with puree Compensations Effortful swallow Postural Changes Seated upright at 90 degrees   CHL IP OTHER RECOMMENDATIONS 10/08/2020 Recommended Consults -- Oral Care Recommendations Oral care BID Other Recommendations Order thickener from pharmacy   No flowsheet data found.  CHL IP FREQUENCY AND DURATION 10/08/2020 Speech Therapy Frequency (ACUTE ONLY) min 2x/week Treatment Duration 2 weeks      CHL IP ORAL PHASE 10/08/2020 Oral Phase WFL Oral - Pudding Teaspoon -- Oral - Pudding Cup -- Oral - Honey Teaspoon -- Oral - Honey Cup -- Oral - Nectar Teaspoon -- Oral - Nectar Cup -- Oral - Nectar Straw -- Oral - Thin Teaspoon -- Oral - Thin Cup -- Oral - Thin Straw -- Oral - Puree -- Oral - Mech Soft -- Oral - Regular -- Oral - Multi-Consistency -- Oral - Pill -- Oral Phase - Comment --  CHL IP PHARYNGEAL PHASE 10/08/2020 Pharyngeal Phase Impaired Pharyngeal- Pudding Teaspoon -- Pharyngeal -- Pharyngeal- Pudding Cup -- Pharyngeal -- Pharyngeal- Honey Teaspoon -- Pharyngeal -- Pharyngeal- Honey Cup -- Pharyngeal -- Pharyngeal- Nectar Teaspoon -- Pharyngeal -- Pharyngeal- Nectar Cup -- Pharyngeal -- Pharyngeal- Nectar Straw Pharyngeal residue - valleculae;Pharyngeal residue - pyriform;Compensatory strategies attempted (with notebox) Pharyngeal -- Pharyngeal- Thin Teaspoon -- Pharyngeal -- Pharyngeal- Thin Cup Compensatory strategies attempted (with notebox);Pharyngeal residue - valleculae;Pharyngeal residue - pyriform;Penetration/Aspiration  before swallow;Penetration/Aspiration during swallow;Moderate aspiration Pharyngeal Material enters airway, passes BELOW cords without attempt by patient to eject out (silent aspiration) Pharyngeal- Thin Straw Penetration/Aspiration during swallow;Trace aspiration;Pharyngeal residue - valleculae;Pharyngeal residue -  pyriform;Compensatory strategies attempted (with notebox) Pharyngeal Material enters airway, remains ABOVE vocal cords and not ejected out Pharyngeal- Puree Pharyngeal residue - valleculae;Pharyngeal residue - pyriform Pharyngeal -- Pharyngeal- Mechanical Soft -- Pharyngeal -- Pharyngeal- Regular Pharyngeal residue - valleculae;Pharyngeal residue - pyriform Pharyngeal -- Pharyngeal- Multi-consistency -- Pharyngeal -- Pharyngeal- Pill -- Pharyngeal -- Pharyngeal Comment --  No flowsheet data found. DeBlois, Riley Nearing 10/08/2020, 2:16 PM              ECHOCARDIOGRAM COMPLETE  Result Date: 10/06/2020    ECHOCARDIOGRAM REPORT   Patient Name:   Mark May Date of Exam: 10/06/2020 Medical Rec #:  [id number]   Height:       67.0 in Accession #:    [id number]  Weight:       125.0 lb Date of Birth:  08/21/1956    BSA:          1.656 m Patient Age:    64 years    BP:           109/63 mmHg Patient Gender: M           HR:           71 bpm. Exam Location:  Inpatient Procedure: 2D Echo Indications:    stroke 434.91  History:        Patient has no prior history of Echocardiogram examinations.                 Risk Factors:Hypertension.  Sonographer:    Celene Skeen RDCS (AE) Referring Phys: [id number] Marvel Plan  Sonographer Comments: Echo performed with patient supine and on artificial respirator. Image acquisition challenging due to respiratory motion. very restricted mobility. difficulty remaining still at times. IMPRESSIONS  1. Left ventricular ejection fraction, by estimation, is 70 to 75%. The left ventricle has hyperdynamic function. The left ventricle has no regional wall motion abnormalities. Left  ventricular diastolic parameters are consistent with Grade II diastolic dysfunction (pseudonormalization). Elevated left atrial pressure.  2. Right ventricular systolic function is normal. The right ventricular size is normal.  3. The mitral valve is normal in structure. Mild mitral valve regurgitation. No evidence of mitral stenosis.  4. The aortic valve is normal in structure. Aortic valve regurgitation is not visualized. No aortic stenosis is present.  5. The inferior vena cava is dilated in size with <50% respiratory variability, suggesting right atrial pressure of 15 mmHg. Conclusion(s)/Recommendation(s): No intracardiac source of embolism detected on this transthoracic study. A transesophageal echocardiogram is recommended to exclude cardiac source of embolism if clinically indicated. FINDINGS  Left Ventricle: Left ventricular ejection fraction, by estimation, is 70 to 75%. The left ventricle has hyperdynamic function. The left ventricle has no regional wall motion abnormalities. The left ventricular internal cavity size was normal in size. There is no left ventricular hypertrophy. Left ventricular diastolic parameters are consistent with Grade II diastolic dysfunction (pseudonormalization). Elevated left atrial pressure. Right Ventricle: The right ventricular size is normal. No increase in right ventricular wall thickness. Right ventricular systolic function is normal. Left Atrium: Left atrial size was normal in size. Right Atrium: Right atrial size was normal in size. Pericardium: There is no evidence of pericardial effusion. Mitral Valve: The mitral valve is normal in structure. Mild mitral valve regurgitation. No evidence of mitral valve stenosis. Tricuspid Valve: The tricuspid valve is normal in structure. Tricuspid valve regurgitation is trivial. No evidence of tricuspid stenosis. Aortic Valve: The aortic valve is normal in structure. Aortic valve regurgitation is not visualized.  No aortic stenosis is  present. Pulmonic Valve: The pulmonic valve was normal in structure. Pulmonic valve regurgitation is not visualized. No evidence of pulmonic stenosis. Aorta: The aortic root is normal in size and structure. Venous: The inferior vena cava is dilated in size with less than 50% respiratory variability, suggesting right atrial pressure of 15 mmHg. IAS/Shunts: No atrial level shunt detected by color flow Doppler.  LEFT VENTRICLE PLAX 2D LVIDd:         3.60 cm  Diastology LVIDs:         1.90 cm  LV e' medial:    7.07 cm/s LV PW:         1.00 cm  LV E/e' medial:  15.8 LV IVS:        0.90 cm  LV e' lateral:   9.46 cm/s LVOT diam:     2.40 cm  LV E/e' lateral: 11.8 LV SV:         155 LV SV Index:   93 LVOT Area:     4.52 cm  LEFT ATRIUM             Index       RIGHT ATRIUM           Index LA diam:        2.90 cm 1.75 cm/m  RA Area:     10.30 cm LA Vol (A2C):   42.3 ml 25.54 ml/m RA Volume:   18.60 ml  11.23 ml/m LA Vol (A4C):   33.2 ml 20.05 ml/m LA Biplane Vol: 39.7 ml 23.97 ml/m  AORTIC VALVE LVOT Vmax:   153.00 cm/s LVOT Vmean:  115.000 cm/s LVOT VTI:    0.342 m  AORTA Ao Root diam: 2.80 cm MITRAL VALVE MV Area (PHT): 2.99 cm     SHUNTS MV Decel Time: 254 msec     Systemic VTI:  0.34 m MV E velocity: 112.00 cm/s  Systemic Diam: 2.40 cm MV A velocity: 94.70 cm/s MV E/A ratio:  1.18 Tobias Alexander MD Electronically signed by Tobias Alexander MD Signature Date/Time: 10/06/2020/3:01:36 PM    Final    VAS [place] UPPER EXTREMITY VENOUS DUPLEX  Result Date: 10/08/2020 UPPER VENOUS STUDY  Indications: Swelling Risk Factors: None identified. Comparison Study: No prior studies. Performing Technologist: Chanda Busing RVT  Examination Guidelines: A complete evaluation includes B-mode imaging, spectral Doppler, color Doppler, and power Doppler as needed of all accessible portions of each vessel. Bilateral testing is considered an integral part of a complete examination. Limited examinations for reoccurring indications may be  performed as noted.  Right Findings: +----------+------------+---------+-----------+----------+-------+ RIGHT     CompressiblePhasicitySpontaneousPropertiesSummary +----------+------------+---------+-----------+----------+-------+ Subclavian    Full       Yes       Yes                      +----------+------------+---------+-----------+----------+-------+  Left Findings: +----------+------------+---------+-----------+----------+-----------------+ LEFT      CompressiblePhasicitySpontaneousProperties     Summary      +----------+------------+---------+-----------+----------+-----------------+ IJV           Full       Yes       Yes                                +----------+------------+---------+-----------+----------+-----------------+ Subclavian    Full       Yes       Yes                                +----------+------------+---------+-----------+----------+-----------------+  Axillary      Full       Yes       Yes                                +----------+------------+---------+-----------+----------+-----------------+ Brachial      Full       Yes       Yes                                +----------+------------+---------+-----------+----------+-----------------+ Radial        Full                                                    +----------+------------+---------+-----------+----------+-----------------+ Ulnar         Full                                                    +----------+------------+---------+-----------+----------+-----------------+ Cephalic      None                                  Age Indeterminate +----------+------------+---------+-----------+----------+-----------------+ Basilic       Full                                                    +----------+------------+---------+-----------+----------+-----------------+  Summary:  Right: No evidence of thrombosis in the subclavian.  Left: No evidence of deep vein  thrombosis in the upper extremity. Findings consistent with age indeterminate superficial vein thrombosis involving the left cephalic vein.  *See table(s) above for measurements and observations.  Diagnosing physician: Sherald Hess MD Electronically signed by Sherald Hess MD on 10/08/2020 at 3:28:16 PM.    Final    [place] EKG SITE RITE  Result Date: 10/13/2020 If Site Rite image not attached, placement could not be confirmed due to current cardiac rhythm.   Labs:  CBC: Recent Labs    10/11/20 1706 10/12/20 0314 10/13/20 1009 10/14/20 0116  WBC 5.4 5.8 9.4 9.7  HGB 12.4* 11.7* 11.4* 10.7*  HCT 36.7* 34.3* 32.4* 30.1*  PLT 218 202 222 197    COAGS: Recent Labs    10/11/2020 1246 10/11/20 1706  INR 0.9 1.1  APTT 25 41*    BMP: Recent Labs    10/11/20 1706 10/12/20 0314 10/13/20 1009 10/14/20 0116  NA 140 143 143 145  K 3.6 3.8 3.1* 3.5  CL 110 113* 111 113*  CO2 18* 19* 20* 22  GLUCOSE 155* 132* 194* 151*  BUN 47* 42* 43* 42*  CALCIUM 8.1* 8.0* 7.5* 7.5*  CREATININE 1.86* 1.61* 1.40* 1.35*  GFRNONAA 40* 47* 56* 58*    LIVER FUNCTION TESTS: Recent Labs    10/01/2020 1246 10/08/20 0727 10/11/20 1706 10/13/20 1009  BILITOT 0.9 2.4* 1.3* 0.8  AST 24 53* 101* 105*  ALT 32 38 45* 60*  ALKPHOS 113 121  96 163*  PROT 7.3 5.9* 5.3* 5.0*  ALBUMIN 4.3 2.6* 1.6* 1.4*    TUMOR MARKERS: No results for input(s): AFPTM, CEA, CA199, CHROMGRNA in the last 8760 hours.  Assessment and Plan:  Large right perinephric fluid collection and pockets of air concerning for an infectious process/abscess.   Will proceed with image guided drain placement today.  Risks and benefits of drain placement discussed with the patient and his wife and son including bleeding, infection, damage to adjacent structures, bowel perforation/fistula connection, and sepsis.  All questions were answered, patient is agreeable to proceed. Consent signed and in chart.  Thank you for this  interesting consult.  I greatly enjoyed meeting Mark May and look forward to participating in their care.  A copy of this report was sent to the requesting provider on this date.  Electronically Signed: Gwynneth Macleod, PA-C   10/14/2020, 8:57 AM      I spent a total of 40 Minutes in face to face in clinical consultation, greater than 50% of which was counseling/coordinating care for drain placement.'

## 2020-10-14 NOTE — Progress Notes (Signed)
10 Days Post-Op Subjective: Pain appears controlled. Afebrile.   Objective: Vital signs in last 24 hours: Temp:  [98 F (36.7 C)-99.3 F (37.4 C)] 98.2 F (36.8 C) (11/15 0357) Pulse Rate:  [90-108] 90 (11/15 0357) Resp:  [14-29] 23 (11/15 0600) BP: (122-161)/(65-93) 145/93 (11/15 0357) SpO2:  [91 %-96 %] 95 % (11/15 0357)  Intake/Output from previous day: 11/14 0701 - 11/15 0700 In: 1091.8 [I.V.:127.9; NG/GT:600; IV Piggyback:358.9] Out: 2765 [Urine:1900; Drains:865] Intake/Output this shift: No intake/output data recorded.  Physical Exam:  General: Alert and oriented CV: RRR Lungs: Clear Abdomen: Soft, ND, Tender over right abdomen and flank; R perinephric drain purulent Ext: NT, No erythema  Lab Results: Recent Labs    10/13/20 1009 10/14/20 0116 10/15/20 0408  HGB 11.4* 10.7* 10.4*  HCT 32.4* 30.1* 29.6*   BMET Recent Labs    10/14/20 0116 10/15/20 0408  NA 145 149*  K 3.5 3.3*  CL 113* 116*  CO2 22 20*  GLUCOSE 151* 148*  BUN 42* 45*  CREATININE 1.35* 1.40*  CALCIUM 7.5* 7.5*     Studies/Results: CT ABDOMEN PELVIS WO CONTRAST  Result Date: 10/13/2020 CLINICAL DATA:  64 year old male with abdominal abscess or infection. EXAM: CT ABDOMEN AND PELVIS WITHOUT CONTRAST TECHNIQUE: Multidetector CT imaging of the abdomen and pelvis was performed following the standard protocol without IV contrast. COMPARISON:  Renal ultrasound dated 10/11/2020. FINDINGS: Evaluation of this exam is limited in the absence of intravenous contrast. Lower chest: Partially visualized small bilateral pleural effusions with complete consolidative changes of the visualized lower lobes. No intra-abdominal free air. There is a small free fluid in the pelvis. Hepatobiliary: The liver is unremarkable. No intrahepatic biliary dilatation. There are multiple stones in the gallbladder. No pericholecystic fluid or evidence of acute cholecystitis by CT. Pancreas: Unremarkable. No pancreatic ductal  dilatation or surrounding inflammatory changes. Spleen: Normal in size without focal abnormality. Adrenals/Urinary Tract: The adrenal glands unremarkable. Multiple nonobstructing right renal calculi measure up to 4 mm in the inferior pole of the right kidney. No hydronephrosis. There is no hydronephrosis or nephrolithiasis on the left. Multiple bilateral renal hypodense lesions are not characterized on this noncontrast CT. There is fluid collection with scattered pockets of air in the right perinephric space and along the right psoas muscle extending down into the pelvis. The fluid collection measures 7.4 x 4.3 cm in greatest axial dimensions and 17 cm in craniocaudal length. There is air within the urinary bladder, possibly related to recent instrumentation. An infectious process is not excluded. Stomach/Bowel: A feeding tube is noted with tip appearing in the distal stomach. Evaluation however is limited due to respiratory motion. Contrast is noted in the colon. There is distal colonic diverticulosis without active inflammatory changes. No bowel obstruction. The appendix is not visualized with certainty. Vascular/Lymphatic: Moderate aortoiliac atherosclerotic disease. The IVC is unremarkable. No portal venous gas. There is no adenopathy. Reproductive: The prostate and seminal vesicles are grossly unremarkable. Other: There is mild diffuse subcutaneous edema. No fluid collection. Musculoskeletal: No acute or significant osseous findings. IMPRESSION: 1. Large right perinephric fluid collection and pockets of air concerning for an infectious process/abscess. Clinical correlation is recommended. 2. Cholelithiasis. 3. Nonobstructing right renal calculi. No hydronephrosis. 4. Colonic diverticulosis. No bowel obstruction. 5. Partially visualized small bilateral pleural effusions with complete consolidative changes of the visualized lower lobes. 6. Aortic Atherosclerosis (ICD10-I70.0). Electronically Signed   By: Elgie Collard M.D.   On: 10/13/2020 16:42   DG Abd 1 View  Result  Date: 10/13/2020 CLINICAL DATA:  Abdominal distension EXAM: ABDOMEN - 1 VIEW COMPARISON:  October 10, 2020 FINDINGS: Flocculated enteric contrast is seen within the colon. Moderate predominately RIGHT-sided colonic stool burden. Gaseous distension of loops of colon without dilation. Feeding tube tip terminates over the distal stomach. Probable nephrolithiasis. Visualized osseous structures are unremarkable. IMPRESSION: Feeding tube tip terminates over the distal stomach. Electronically Signed   By: Meda Klinefelter MD   On: 10/13/2020 14:30   CT IMAGE GUIDED DRAINAGE BY PERCUTANEOUS CATHETER  Result Date: 10/14/2020 CLINICAL DATA:  Large retroperitoneal abscess centered primarily inferior to the right kidney. EXAM: CT GUIDED CATHETER DRAINAGE OF RIGHT RETROPERITONEAL ABSCESS ANESTHESIA/SEDATION: 2.0 mg IV Versed 100 mcg IV Fentanyl Total Moderate Sedation Time:  12 minutes The patient's level of consciousness and physiologic status were continuously monitored during the procedure by Radiology nursing. PROCEDURE: The procedure, risks, benefits, and alternatives were explained to the patient. Questions regarding the procedure were encouraged and answered. The patient understands and consents to the procedure. A foreign language interpreter was utilized for consent. A time out was performed prior to initiating the procedure. CT was performed through the abdomen in a prone position. The right translumbar region was prepped with chlorhexidine in a sterile fashion, and a sterile drape was applied covering the operative field. A sterile gown and sterile gloves were used for the procedure. Local anesthesia was provided with 1% Lidocaine. Under CT guidance, an 18 gauge trocar needle was advanced into the right retroperitoneal space. After return of fluid, a guidewire was advanced. The tract was dilated and a 12 French percutaneous drainage catheter  placed. Additional fluid sample was aspirated from the drain and sent for culture analysis. Catheter position was confirmed by CT. The catheter was attached to suction bulb drainage. It was secured at the skin with a Prolene retention suture and StatLock device. COMPLICATIONS: None FINDINGS: The retroperitoneal collection yielded reddish brown fluid. After drain placement there is copious return of fluid. IMPRESSION: CT-guided percutaneous catheter drainage of right retroperitoneal abscess yielding reddish brown fluid. A fluid sample was sent for culture analysis. A 12 French drainage catheter was placed and attached to suction bulb drainage. Electronically Signed   By: Irish Lack M.D.   On: 10/14/2020 12:47   [place] EKG SITE RITE  Result Date: 10/13/2020 If Site Rite image not attached, placement could not be confirmed due to current cardiac rhythm.   Assessment/Plan: 1. Right perinephric abscess: Measures 7.4 x 4.3 x 17 cm along the right psoas down to the pelvis on CT A/P 10/13/2020. Afebrile, WBC 9.4, urine culture 11/10 no growth. S/p R RP drain placement on 11/14 by IR. 2. Sepsis due to right lower lobe pneumonia and now perinephric abscess 3. UXN:[id number] 1.4 on day of consultation from baseline of 0.8-1 4. Admitted for right MCA occlusion s/p thrombectomy and stent placement on 10/18/2020.Hospitalization complicated by hypoxic respiratory failure due to pulmonary edema. Also complicated by A. fib with RVR. mL sepsis due to right lower lobe pneumonia and aspiration and perinephric abscess as above. 5. Multiple bilateral hypodense renal lesions seen on CT A/P with contrast 10/13/2020. 6. Small right renal stones measuring up to 4 mm on CT A/P 10/13/2020  -Continue Vanco and Zosyn -S/p IR drain placement on 11/14 -F/u abscess cx - rare GPR -We will obtain interval CT noncontrast study to evaluate for multiple bilateral renal hypodense lesions -He does have quite small right  nonobstructing renal stones measuring up to 4 mm. At this time, no  need for management of these small nonobstructing stones. -Following   LOS: 10 days   Jannifer Hick 10/15/2020, 7:16 AM Matt R. Issabelle Mcraney MD [facility]  Pager: [phone]

## 2020-10-14 NOTE — Progress Notes (Signed)
STROKE TEAM PROGRESS NOTE   INTERVAL HISTORY Wife and daughter are at bedside. Pt had retroperitoneal abscess drainage this morning, draining brown/reddish fluid from the drainage tube.  Patient lethargic, intermittently woke up with voice, but not able to keep wakefulness.  No fever overnight, creatinine continues to downtrending.  No leukocytosis.  Abdomen distention has improved.  Per wife, patient has multiple bowel movement overnight.  OBJECTIVE Vitals:   10/13/20 1940 10/13/20 2052 10/14/20 0046 10/14/20 0415  BP: (!) 157/78 (!) 145/90 131/76 (!) 144/83  Pulse: (!) 109 (!) 112 93 99  Resp: (!) 36 (!) 30 (!) 1 (!) 30  Temp: 98 F (36.7 C) 99 F (37.2 C) 98.6 F (37 C) 98.1 F (36.7 C)  TempSrc: Oral Oral Oral Oral  SpO2: 96% 96% 98% 95%  Weight:       CBC:  Recent Labs  Lab 10/11/20 1706 10/12/20 0314 10/13/20 1009 10/14/20 0116  WBC 5.4   < > 9.4 9.7  NEUTROABS 4.8  --   --   --   HGB 12.4*   < > 11.4* 10.7*  HCT 36.7*   < > 32.4* 30.1*  MCV 87.0   < > 84.8 82.7  PLT 218   < > 222 197   < > = values in this interval not displayed.   Basic Metabolic Panel:  Recent Labs  Lab 10/13/20 1009 10/13/20 1009 10/13/20 1658 10/14/20 0116  NA 143  --   --  145  K 3.1*  --   --  3.5  CL 111  --   --  113*  CO2 20*  --   --  22  GLUCOSE 194*  --   --  151*  BUN 43*  --   --  42*  CREATININE 1.40*  --   --  1.35*  CALCIUM 7.5*  --   --  7.5*  MG 2.1   < > 2.1 2.2  PHOS 2.6   < > 2.6 2.8   < > = values in this interval not displayed.   Lipid Panel:     Component Value Date/Time   CHOL 173 10/06/2020 0522   CHOL 216 (H) 03/09/2018 1620   TRIG 396 (H) 10/07/2020 0527   HDL 33 (L) 10/06/2020 0522   HDL 59 03/09/2018 1620   CHOLHDL 5.2 10/06/2020 0522   VLDL 55 (H) 10/06/2020 0522   LDLCALC 85 10/06/2020 0522   LDLCALC 136 (H) 03/09/2018 1620   HgbA1c:  Lab Results  Component Value Date   HGBA1C 5.8 (H) 10/06/2020   Urine Drug Screen: No results found for:  LABOPIA, COCAINSCRNUR, LABBENZ, AMPHETMU, THCU, LABBARB  Alcohol Level No results found for: [facility]  IMAGING  CT HEAD WO CONTRAST 10/20/2020 1. No acute abnormality.  2. Minimal diffuse cerebral and cerebellar atrophy.  3. Mild chronic small vessel white matter ischemic changes in both cerebral hemispheres.   CT Angio Head W or Wo Contrast CT Angio Neck W and/or Wo Contrast CT CEREBRAL PERFUSION W CONTRAST 10/17/2020 1. 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  2. Right MCA infarct with extensive penumbra.  3. 4 mm right supraclinoid ICA aneurysm.  4. Widely patent cervical carotid and vertebral arteries.  5. Aortic Atherosclerosis (ICD10-I70.0).   CT HEAD WO CONTRAST 10/27/2020 Hypodensity in the right lateral temporal lobe now shows mild hemorrhage or contrast enhancement. This is most consistent with acute infarct. There has been interval stenting of the right middle cerebral artery.  CT HEAD WO CONTRAST 10/04/2020 Right temporal lobe hypodensity compatible with acute infarct. Decreased hyperdensity within this stroke compared with 4 hours previously compatible with contrast staining rather than hemorrhage. Small area of acute infarct in the right parietal lobe now evident. Right MCA stent.   MRI brain: 10/06/20 1. Fairly extensive patchy acute/early subacute infarcts within the right MCA/watershed territory.  2. Additional small acute/early subacute infarcts within the dorsal right thalamus and left basal ganglia. 3. Background moderate chronic small vessel ischemic disease.  MRA head: 10/06/20 1. Interval stenting of the M1/M2 right middle cerebral artery. Flow related signal is present proximal and distal to the stent suggestive of stent patency. 2. 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  DG Abd 1 View 10/29/2020 Gastric catheter within the stomach kinked at the proximal side port. This could be withdrawn slightly S  necessary.   Transthoracic Echocardiogram  10/06/2020 1. Left ventricular ejection fraction, by estimation, is 70 to 75%. The left ventricle has hyperdynamic function. The left ventricle has no regional wall motion abnormalities. Left ventricular diastolic parameters are consistent with Grade II diastolic dysfunction (pseudonormalization). Elevated left atrial pressure.  2. Right ventricular systolic function is normal. The right ventricular size is normal.  3. The mitral valve is normal in structure. Mild mitral valve regurgitation. No evidence of mitral stenosis.  4. The aortic valve is normal in structure. Aortic valve regurgitation is not visualized. No aortic stenosis is present.  5. The inferior vena cava is dilated in size with <50% respiratory variability, suggesting right atrial pressure of 15 mmHg.  ECG - SR rate 70 BPM. (See cardiology reading for complete details)   PHYSICAL EXAM    Temp:  [98 F (36.7 C)-99 F (37.2 C)] 98.1 F (36.7 C) (11/14 0415) Pulse Rate:  [93-114] 99 (11/14 0415) Resp:  [1-42] 30 (11/14 0415) BP: (128-163)/(75-101) 144/83 (11/14 0415) SpO2:  [94 %-98 %] 95 % (11/14 0415)  General - Well nourished, well developed, mild restless.  Ophthalmologic - fundi not visualized due to noncooperation.  Cardiovascular - Regular rhythm and rate.  Abdominal - mild abdominal extension, slight tenderness on palpation, still has gas sound on ascultation.   Neuro - lethargic, sleepy and intermittently open eyes on voice, not able to keep wakefulness without stimulation. When awake, he is orientated to self, age and people and place. Moderate dysarthria, no aphasia but paucity of speech, able to name and repeat, follows simple commands. Tracking bilaterally, seems to have intact visual field. PERRL, no gaze deviation or palsy. Left facial droop. Tongue midline. Left UE flaccid. Left LE slight withdraw to pain. RUE at least 3/5 and RLE at least 3/5. Sensation,  coordination not cooperative and gait not tested.   ASSESSMENT/PLAN Mr. Bary CastillaLi Fei Guterrez is a 64 y.o. male with history of hypertension, BPH, sciatica, stomach ulcer status post surgery in the past presented [facility] ER [facility] for acute onset left-sided weakness, left facial droop and left hemianopia.  CT head and neck showed right M1 high-grade stenosis versus near occlusive thrombosis.  CT perfusion showed large penumbra.  [facility].  He did not receive IV t-PA due to late presentation (>4.5 hours from time of onset). IR - complete occlusion of RT MCA M 1 seg - thrombectomy ->TICI 3 revascularization with placement of rescue stent m4 mm x 24 mm Atlas for reocclusion with restoration of TICI 3 .  Stroke: R MCA infarct s/p IR  TICI3 R M1 w/ stent placement - likely embolic due to new diagnosed afib  CT Head - No acute abnormality. Minimal diffuse cerebral and cerebellar atrophy.    CTA H&N - Severe distal right M1 stenosis or subocclusive thrombus. 4 mm right supraclinoid ICA aneurysm. Widely patent cervical carotid and vertebral arteries.   CTP - Right MCA infarct with extensive penumbra.   MRI head - Fairly extensive patchy acute/early subacute infarcts within the right MCA/watershed territory. Additional small acute/early subacute infarcts within the dorsal right thalamus and left basal ganglia.  MRA head -  Interval stenting of the M1/M2 right middle cerebral artery. Flow related signal is present proximal and distal to the stent suggestive of stent patency. 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  2D Echo - EF 70 - 75%. No cardiac source of emboli identified.   UE doppler neg DVT  Loyal Jacobson Virus 2 - negative  LDL - 85  HgbA1c - 5.8  VTE prophylaxis - SCDs  No antithrombotic prior to admission, now on Brilinta (ticagrelor) 90 mg bid and heparin IV.     Therapy recommendations:   CIR  Disposition:  Pending  Atrial Fibrillation w/ RVR, new diagnosis 11/11  CHA2DS2-VASc Score = at least 5,  ?2 oral anticoagulation recommended  Age in Years:  72-74   +1    Sex:  Male   0    Hypertension History:  yes   +1     Diabetes Mellitus:  0  Congestive Heart Failure History:  yes   +1   Vascular Disease History:  0     Stroke/TIA/Thromboembolism History:  yes   +2 . Off amio gtt -> amio po now . On IV heparin   Sepsis with fever Retroperitoneal abscess RLL PNA, likely aspiration  CXR 11/13 persistent retrocardiac left base collapse/consolidation and diffuse bilateral airspace disease  Tmax 102.6->101.2-> afebrile  WBC 5.8->9.4-> 9.7  On Zosyn and vanco  Blood Cx NGTD  CT abdomen and pelvis right retroperitoneal abscess  S/p abscess drainage  Source of retroperitoneal abscess unclear - GU vs. GI - urology on board and also may curbside GI - also consider repeat CT abdomen with po vs. IV contrast - discussed with Dr. Sharl Ma  CCM on board  AKI  Cre 1.48-1.69-1.96->1.61->1.40->1.35  Stopped lisinopril   On FW 200 Q4h  Avoid nephrotoxins  Hypertension Tachycardia  Home BP meds: Amlodipine  off Cleviprex  Stable . BP goal <180 . Stopped lisinopril d/t rising Cre . Long-term BP goal normotensive  Hyperlipidemia  Home Lipid lowering medication: none  LDL 85, goal < 70  Off lipitor now due to elevated CK level  Continue statin at discharge once CK level normalizes  Dysphagia . Secondary to stroke . Resume FW today . Continue to hold off TF due to abdominal extension - also need to rule out GI source of retroperitoneal abscess  . Speech on board   Other Stroke Risk Factors  Advanced age  Other Active Problems  Aortic Atherosclerosis (ICD10-I70.0).   4 mm right supraclinoid ICA aneurysm.  Hypokalemia - 3.1 ->3.5  Hx of PUD s/p surgery  Hospital day # 9   I spent  35 minutes in total face-to-face time with the patient, more  than 50% of which was spent in counseling and coordination of care, reviewing test results, images and medication, and discussing the diagnosis, treatment plan and potential prognosis. This patient's care requiresreview of multiple databases, neurological assessment, discussion with family,  other specialists and medical decision making of high complexity. I had long discussion with wife and daughter at bedside and son over the phone, updated pt current condition, treatment plan and potential prognosis, and answered all the questions. They expressed understanding and appreciation.   Marvel Plan, MD PhD Stroke Neurology 10/14/2020 6:17 PM    to contact Stroke Continuity provider, please refer to [email or web address]. After hours, contact General Neurology

## 2020-10-14 NOTE — Progress Notes (Signed)
[facility]  PROGRESS NOTE  Mark May OEV:[id number] DOB: 1956/04/05 DOA: 10/04/2020 PCP: Reubin Milan, MD   Brief HPI:   64 year old male with medical history of hypertension, hyperlipidemia, stomach ulcer surgery who presented to [facility] on 10/02/2020 with left-sided weakness, noted to have right MCA occlusion s/p thrombectomy and stent placement on 10/07/2020.  Patient hospitalization was complicated by hypoxic respiratory failure due to pulmonary edema requiring BiPAP and Lasix.  He was awaiting ECF placement however on 10/10/2020 he became more lethargic and tachycardic with tachypnea.  Patient went into A. fib with RVR ,also had a fever.  Due to concern for severe sepsis, PCCM was consulted.  Patient found to have sepsis with right lower lobe pneumonia and likely aspiration.  Started on IV Zosyn.  Also started on amiodarone and heparin gtt.    Subjective   Patient seen and examined, CT abdomen/pelvis yesterday showed right perinephric abscess.  Urology was consulted and plan for drain placement per IR today.   Assessment/Plan:     1. Sepsis due to pneumonia-?  Aspiration.  Patient started on IV Zosyn.  PCCM following. 2. Right perinephric abscess-patient has tenderness in right lower quadrant with guarding, erythematous rash noted in right lower quadrant.  CT abdomen/pelvis showed right perinephric abscess.  Drain placement per IR today. 3. Dysphagia-patient is high risk for aspiration, is unable to consume adequate calories.  Speech therapist discussed with patient and his wife, he agrees for core track feeding tube placement for short duration.  Core track feeding tube is in place will order core track feeding tube.  Patient receiving tube feedings. 4. Paroxysmal atrial fibrillation with RVR-heart rate is controlled, continue heparin GTT, amiodarone infusion. 5. 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  Continue ticagrelor 6. Transaminitis-patient has mild elevated liver enzymes, likely in setting of sepsis.  Follow liver function in a.m. 7. Hypertension-Lopressor on hold due to hypotension 8. Acute kidney injury-patient baseline creatinine is around 0.8-1, creatinine has been steadily rising.  Today creatinine is 1.35 likely from prerenal azotemia.  Continue LR at 100 mill per hour.  Follow BMP in am. 9. Hypokalemia-replete 10. Diabetes mellitus type 2-CBG well controlled, continue sliding scale insulin NovoLog.     COVID-19 Labs   Recent Labs    10/11/20 1746  DDIMER 9.78*    Lab Results  Component Value Date   SARSCOV2NAA NEGATIVE 10/24/2020     Scheduled medications:   .  stroke: mapping our early stages of recovery book   Does not apply Once  . [START ON 10/20/2020] amiodarone  200 mg Per Tube Daily   Followed by  . amiodarone  200 mg Per Tube BID  . chlorhexidine  15 mL Mouth Rinse BID  . Chlorhexidine Gluconate Cloth  6 each Topical Daily  . fentaNYL      . insulin aspart  0-9 Units Subcutaneous Q4H  . lidocaine      . mouth rinse  15 mL Mouth Rinse q12n4p  . midazolam      . sodium phosphate  1 enema Rectal Once  . ticagrelor  90 mg Oral BID   Or  . ticagrelor  90 mg Per Tube BID         CBG: Recent Labs  Lab 10/13/20 1637 10/13/20 2053 10/13/20 2338 10/14/20 0353 10/14/20 0957  GLUCAP 105* 144* 144* 130* 129*    SpO2: 93 % O2 Flow Rate (L/min): 3  L/min FiO2 (%): 50 %    CBC: Recent Labs  Lab 10/10/20 0259 10/11/20 1706 10/12/20 0314 10/13/20 1009 10/14/20 0116  WBC 5.1 5.4 5.8 9.4 9.7  NEUTROABS  --  4.8  --   --   --   HGB 13.8 12.4* 11.7* 11.4* 10.7*  HCT 41.0 36.7* 34.3* 32.4* 30.1*  MCV 88.7 87.0 86.0 84.8 82.7  PLT 203 218 202 222 197    Basic Metabolic Panel: Recent Labs  Lab 10/08/20 0727 10/11/20 0915 10/11/20 1706 10/11/20 1746 10/12/20 0314 10/12/20 1814 10/13/20 1009 10/13/20 1658 10/14/20 0116   NA  --  139 140  --  143  --  143  --  145  K  --  4.1 3.6  --  3.8  --  3.1*  --  3.5  CL  --  111 110  --  113*  --  111  --  113*  CO2  --  19* 18*  --  19*  --  20*  --  22  GLUCOSE  --  248* 155*  --  132*  --  194*  --  151*  BUN  --  47* 47*  --  42*  --  43*  --  42*  CREATININE  --  1.96* 1.86*  --  1.61*  --  1.40*  --  1.35*  CALCIUM  --  7.7* 8.1*  --  8.0*  --  7.5*  --  7.5*  MG   < >  --   --  2.3  --  2.2 2.1 2.1 2.2  PHOS   < >  --  1.6*  --   --  2.9 2.6 2.6 2.8   < > = values in this interval not displayed.     Liver Function Tests: Recent Labs  Lab 10/08/20 0727 10/11/20 1706 10/13/20 1009  AST 53* 101* 105*  ALT 38 45* 60*  ALKPHOS 121 96 163*  BILITOT 2.4* 1.3* 0.8  PROT 5.9* 5.3* 5.0*  ALBUMIN 2.6* 1.6* 1.4*     Antibiotics: Anti-infectives (From admission, onward)   Start     Dose/Rate Route Frequency Ordered Stop   10/14/20 0400  vancomycin (VANCOREADY) IVPB 500 mg/100 mL        500 mg 100 mL/hr over 60 Minutes Intravenous Every 24 hours 10/13/20 0233     10/13/20 0330  vancomycin (VANCOREADY) IVPB 750 mg/150 mL        750 mg 150 mL/hr over 60 Minutes Intravenous  Once 10/13/20 0233 10/13/20 0439   10/12/20 1800  ceFEPIme (MAXIPIME) 2 g in sodium chloride 0.9 % 100 mL IVPB  Status:  Discontinued        2 g 200 mL/hr over 30 Minutes Intravenous Every 24 hours 10/11/20 1718 10/11/20 1742   10/12/20 1800  vancomycin (VANCOREADY) IVPB 750 mg/150 mL  Status:  Discontinued        750 mg 150 mL/hr over 60 Minutes Intravenous Every 24 hours 10/11/20 1724 10/11/20 1758   10/12/20 0200  metroNIDAZOLE (FLAGYL) tablet 500 mg  Status:  Discontinued        500 mg Oral Every 8 hours 10/11/20 1715 10/11/20 1758   10/12/20 0030  piperacillin-tazobactam (ZOSYN) IVPB 3.375 g        3.375 g 12.5 mL/hr over 240 Minutes Intravenous Every 8 hours 10/11/20 1805     10/11/20 1830  piperacillin-tazobactam (ZOSYN) IVPB 3.375 g        3.375 g  100 mL/hr over 30  Minutes Intravenous  Once 10/11/20 1805 10/11/20 1839   10/11/20 1715  ceFEPIme (MAXIPIME) 2 g in sodium chloride 0.9 % 100 mL IVPB  Status:  Discontinued        2 g 200 mL/hr over 30 Minutes Intravenous STAT 10/11/20 1710 10/11/20 1742   10/11/20 1715  metroNIDAZOLE (FLAGYL) IVPB 500 mg  Status:  Discontinued        500 mg 100 mL/hr over 60 Minutes Intravenous STAT 10/11/20 1710 10/11/20 1807   10/11/20 1715  vancomycin (VANCOCIN) IVPB 1000 mg/200 mL premix  Status:  Discontinued        1,000 mg 200 mL/hr over 60 Minutes Intravenous STAT 10/11/20 1710 10/11/20 1804   10/14/2020 1541  ceFAZolin (ANCEF) 2-4 GM/100ML-% IVPB       Note to Pharmacy: [facility]   : cabinet override      10/03/2020 1541 10/06/20 0344       DVT prophylaxis: Heparin     Objective   Vitals:   10/14/20 0415 10/14/20 0955 10/14/20 1135 10/14/20 1151  BP: (!) 144/83 (!) 143/81 (!) 148/73 (!) 161/90  Pulse: 99 (!) 101 97 (!) 108  Resp: (!) 30 16 18  (!) 27  Temp: 98.1 F (36.7 C) 99.3 F (37.4 C)    TempSrc: Oral Oral    SpO2: 95% 94% 91% 93%  Weight:        Intake/Output Summary (Last 24 hours) at 10/14/2020 1155 Last data filed at 10/14/2020 0428 Gross per 24 hour  Intake 726.91 ml  Output 1400 ml  Net -673.09 ml    11/12 1901 - 11/14 0700 In: 3446.7 [I.V.:2062.4] Out: 1400 [Urine:1400]  Filed Weights   10/12/20 0500 10/13/20 0414  Weight: 56.5 kg 65 kg    Physical Examination:   General-appears in no acute distress  Heart-S1-S2, regular, no murmur auscultated  Lungs-clear to auscultation bilaterally, no wheezing or crackles auscultated  Abdomen-soft, mild tenderness in right lower quadrant, dusky erythema of skin noted  Extremities-no edema in the lower extremities  Neuro-alert, oriented x3, no focal deficit noted        Data Reviewed:   Recent Results (from the past 240 hour(s))  Respiratory Panel by RT PCR (Flu A&B, Covid) - Nasopharyngeal Swab     Status: None    Collection Time: 10/09/2020  2:44 PM   Specimen: Nasopharyngeal Swab  Result Value Ref Range Status   SARS Coronavirus 2 by RT PCR NEGATIVE NEGATIVE Final    Comment: (NOTE) SARS-CoV-2 target nucleic acids are NOT DETECTED.  The SARS-CoV-2 RNA is generally detectable in upper respiratoy specimens during the acute phase of infection. The lowest concentration of SARS-CoV-2 viral copies this assay can detect is 131 copies/mL. A negative result does not preclude SARS-Cov-2 infection and should not be used as the sole basis for treatment or other patient management decisions. A negative result may occur with  improper specimen collection/handling, submission of specimen other than nasopharyngeal swab, presence of viral mutation(s) within the areas targeted by this assay, and inadequate number of viral copies (<131 copies/mL). A negative result must be combined with clinical observations, patient history, and epidemiological information. The expected result is Negative.  Fact Sheet for Patients:  13/05/21  Fact Sheet for Healthcare Providers:  https://www.moore.com/  This test is no t yet approved or cleared by the https://www.young.biz/ FDA and  has been authorized for detection and/or diagnosis of SARS-CoV-2 by FDA under an Emergency Use Authorization (EUA). This EUA  will remain  in effect (meaning this test can be used) for the duration of the COVID-19 declaration under Section 564(b)(1) of the Act, 21 U.S.C. section 360bbb-3(b)(1), unless the authorization is terminated or revoked sooner.     Influenza A by PCR NEGATIVE NEGATIVE Final   Influenza B by PCR NEGATIVE NEGATIVE Final    Comment: (NOTE) The Xpert Xpress SARS-CoV-2/FLU/RSV assay is intended as an aid in  the diagnosis of influenza from Nasopharyngeal swab specimens and  should not be used as a sole basis for treatment. Nasal washings and  aspirates are unacceptable for Xpert  Xpress SARS-CoV-2/FLU/RSV  testing.  Fact Sheet for Patients: https://www.moore.com/  Fact Sheet for Healthcare Providers: https://www.young.biz/  This test is not yet approved or cleared by the [place] FDA and  has been authorized for detection and/or diagnosis of SARS-CoV-2 by  FDA under an Emergency Use Authorization (EUA). This EUA will remain  in effect (meaning this test can be used) for the duration of the  Covid-19 declaration under Section 564(b)(1) of the Act, 21  U.S.C. section 360bbb-3(b)(1), unless the authorization is  terminated or revoked. Performed at [facility], [address] [place], [place] [address]   MRSA PCR Screening     Status: None   Collection Time: Oct 19, 2020  6:34 PM   Specimen: Nasal Mucosa; Nasopharyngeal  Result Value Ref Range Status   MRSA by PCR NEGATIVE NEGATIVE Final    Comment:        The GeneXpert MRSA Assay (FDA approved for NASAL specimens only), is one component of a comprehensive MRSA colonization surveillance program. It is not intended to diagnose MRSA infection nor to guide or monitor treatment for MRSA infections. Performed at [facility], [address], [place], [place] [address]   Culture, Urine     Status: None   Collection Time: 10/10/20  5:13 PM   Specimen: Urine, Catheterized  Result Value Ref Range Status   Specimen Description URINE, CATHETERIZED  Final   Special Requests NONE  Final   Culture   Final    NO GROWTH Performed at [facility], [address], [place], [place] [address]    Report Status 10/11/2020 FINAL  Final  Culture, blood (x 2)     Status: None (Preliminary result)   Collection Time: 10/11/20  7:13 PM   Specimen: BLOOD  Result Value Ref Range Status   Specimen Description BLOOD SITE NOT SPECIFIED  Final   Special Requests   Final    BOTTLES DRAWN AEROBIC AND ANAEROBIC Blood Culture results may not be optimal due to an  inadequate volume of blood received in culture bottles   Culture   Final    NO GROWTH 3 DAYS Performed at [facility], [address] [place], [place] [address]    Report Status PENDING  Incomplete  Culture, blood (x 2)     Status: None (Preliminary result)   Collection Time: 10/11/20  7:15 PM   Specimen: BLOOD  Result Value Ref Range Status   Specimen Description BLOOD SITE NOT SPECIFIED  Final   Special Requests   Final    BOTTLES DRAWN AEROBIC AND ANAEROBIC Blood Culture adequate volume   Culture   Final    NO GROWTH 3 DAYS Performed at [facility], [address] [place], [place] [address]    Report Status PENDING  Incomplete  Culture, blood (routine x 2)     Status: None (Preliminary result)  Collection Time: 10/13/20  3:01 AM   Specimen: BLOOD LEFT HAND  Result Value Ref Range Status   Specimen Description BLOOD LEFT HAND  Final   Special Requests   Final    AEROBIC BOTTLE ONLY Blood Culture results may not be optimal due to an inadequate volume of blood received in culture bottles   Culture   Final    NO GROWTH 1 DAY Performed at [facility], [address] [place], [place] [address]    Report Status PENDING  Incomplete  Culture, blood (routine x 2)     Status: None (Preliminary result)   Collection Time: 10/13/20 10:10 AM   Specimen: BLOOD RIGHT ARM  Result Value Ref Range Status   Specimen Description BLOOD RIGHT ARM  Final   Special Requests   Final    BOTTLES DRAWN AEROBIC AND ANAEROBIC Blood Culture adequate volume   Culture   Final    NO GROWTH < 24 HOURS Performed at [facility], [address] [place], [place] [address]    Report Status PENDING  Incomplete    No results for input(s): LIPASE, AMYLASE in the last 168 hours. No results for input(s): AMMONIA in the last 168 hours.  Cardiac Enzymes: Recent Labs  Lab 10/13/20 1009  CKTOTAL 991*   BNP (last 3 results) No results for input(s): BNP in the last 8760  hours.  ProBNP (last 3 results) No results for input(s): PROBNP in the last 8760 hours.  Studies:  CT ABDOMEN PELVIS WO CONTRAST  Result Date: 10/13/2020 CLINICAL DATA:  64 year old male with abdominal abscess or infection. EXAM: CT ABDOMEN AND PELVIS WITHOUT CONTRAST TECHNIQUE: Multidetector CT imaging of the abdomen and pelvis was performed following the standard protocol without IV contrast. COMPARISON:  Renal ultrasound dated 10/11/2020. FINDINGS: Evaluation of this exam is limited in the absence of intravenous contrast. Lower chest: Partially visualized small bilateral pleural effusions with complete consolidative changes of the visualized lower lobes. No intra-abdominal free air. There is a small free fluid in the pelvis. Hepatobiliary: The liver is unremarkable. No intrahepatic biliary dilatation. There are multiple stones in the gallbladder. No pericholecystic fluid or evidence of acute cholecystitis by CT. Pancreas: Unremarkable. No pancreatic ductal dilatation or surrounding inflammatory changes. Spleen: Normal in size without focal abnormality. Adrenals/Urinary Tract: The adrenal glands unremarkable. Multiple nonobstructing right renal calculi measure up to 4 mm in the inferior pole of the right kidney. No hydronephrosis. There is no hydronephrosis or nephrolithiasis on the left. Multiple bilateral renal hypodense lesions are not characterized on this noncontrast CT. There is fluid collection with scattered pockets of air in the right perinephric space and along the right psoas muscle extending down into the pelvis. The fluid collection measures 7.4 x 4.3 cm in greatest axial dimensions and 17 cm in craniocaudal length. There is air within the urinary bladder, possibly related to recent instrumentation. An infectious process is not excluded. Stomach/Bowel: A feeding tube is noted with tip appearing in the distal stomach. Evaluation however is limited due to respiratory motion. Contrast is noted  in the colon. There is distal colonic diverticulosis without active inflammatory changes. No bowel obstruction. The appendix is not visualized with certainty. Vascular/Lymphatic: Moderate aortoiliac atherosclerotic disease. The IVC is unremarkable. No portal venous gas. There is no adenopathy. Reproductive: The prostate and seminal vesicles are grossly unremarkable. Other: There is mild diffuse subcutaneous edema. No fluid collection. Musculoskeletal: No acute or significant osseous findings. IMPRESSION: 1. Large right perinephric fluid collection  and pockets of air concerning for an infectious process/abscess. Clinical correlation is recommended. 2. Cholelithiasis. 3. Nonobstructing right renal calculi. No hydronephrosis. 4. Colonic diverticulosis. No bowel obstruction. 5. Partially visualized small bilateral pleural effusions with complete consolidative changes of the visualized lower lobes. 6. Aortic Atherosclerosis (ICD10-I70.0). Electronically Signed   By: Elgie Collard M.D.   On: 10/13/2020 16:42   DG Abd 1 View  Result Date: 10/13/2020 CLINICAL DATA:  Abdominal distension EXAM: ABDOMEN - 1 VIEW COMPARISON:  October 10, 2020 FINDINGS: Flocculated enteric contrast is seen within the colon. Moderate predominately RIGHT-sided colonic stool burden. Gaseous distension of loops of colon without dilation. Feeding tube tip terminates over the distal stomach. Probable nephrolithiasis. Visualized osseous structures are unremarkable. IMPRESSION: Feeding tube tip terminates over the distal stomach. Electronically Signed   By: Meda Klinefelter MD   On: 10/13/2020 14:30   DG Chest Port 1 View  Result Date: 10/13/2020 CLINICAL DATA:  Respiratory failure. EXAM: PORTABLE CHEST 1 VIEW COMPARISON:  09/10/2020 FINDINGS: 0515 hours. Low lung volumes. Retrocardiac left base collapse/consolidation again noted with diffuse interstitial and bilateral airspace disease. Small bilateral effusions persist. A feeding tube  passes into the stomach although the distal tip position is not included on the film. Telemetry leads overlie the chest. IMPRESSION: Low volume film with persistent retrocardiac left base collapse/consolidation and diffuse bilateral airspace disease. Electronically Signed   By: Kennith Center M.D.   On: 10/13/2020 08:12   [place] EKG SITE RITE  Result Date: 10/13/2020 If Site Rite image not attached, placement could not be confirmed due to current cardiac rhythm.      Meredeth Ide   [facility] If 7PM-7AM, please contact night-coverage at [email or web address], Office  [phone]   10/14/2020, 11:55 AM  LOS: 9 days

## 2020-10-14 NOTE — Progress Notes (Signed)
PT Cancellation Note  Patient Details Name: Mark May MRN: [id number] DOB: Jun 19, 1956   Cancelled Treatment:    Reason Eval/Treat Not Completed: Patient at procedure or test/unavailable. Pt in CT for abscess drainage.    Ilda Foil 10/14/2020, 11:45 AM   Aida Raider, PT  Office # [phone] Pager [phone]

## 2020-10-14 NOTE — Progress Notes (Addendum)
ANTICOAGULATION CONSULT NOTE   Pharmacy Consult for Heparin Indication: afib, s/p CVA  No Known Allergies  Patient Measurements: Weight: 65 kg (143 lb 4.8 oz) Heparin Dosing Weight:  56.7 kg  Vital Signs: Temp: 98 F (36.7 C) (11/14 1329) Temp Source: Oral (11/14 1329) BP: 131/75 (11/14 1329) Pulse Rate: 91 (11/14 1329)  Labs: Recent Labs    10/11/20 1706 10/11/20 1706 10/11/20 1746 10/11/20 2107 10/12/20 0314 10/12/20 0314 10/12/20 1044 10/13/20 1009 10/14/20 0116  HGB 12.4*   < >  --   --  11.7*   < >  --  11.4* 10.7*  HCT 36.7*   < >  --   --  34.3*  --   --  32.4* 30.1*  PLT 218   < >  --   --  202  --   --  222 197  APTT 41*  --   --   --   --   --   --   --   --   LABPROT 13.7  --   --   --   --   --   --   --   --   INR 1.1  --   --   --   --   --   --   --   --   HEPARINUNFRC  --   --   --   --  0.48   < > 0.40 0.45 <0.10*  CREATININE 1.86*   < >  --   --  1.61*  --   --  1.40* 1.35*  CKTOTAL  --   --   --   --   --   --   --  991*  --   TROPONINIHS  --   --  43* 35*  --   --   --   --   --    < > = values in this interval not displayed.    Estimated Creatinine Clearance: 50.2 mL/min (A) (by C-G formula based on SCr of 1.35 mg/dL (H)).  Assessment: CC/HPI: 50 YOM who presented on 11/5 with R MCA stroke s/p IR + stent placement for reocclusion   PMH: gout, HTN, BPH, sciatica, stomach ulcer s/p surgery   Anticoag: CVA s/p thrombectomy, stent placement. New onset afib.  On IV heparin. Aiming for lower goal of 0.3-0.5 d/t R MCA stroke. Heparin drip was held starting 11/13 ~6pm for IR drain placement 11/14 and HL this AM was <0.10.  Per IR, patient tolerated procedure and ok to restart heparin drip.  Patient previously therapeutic on 800 units/hr - will restart at that rate and check 6 hour HL.  H/H 10.7/30.1, pltc 197k stable  Goal of Therapy:  Heparin level 0.3-0.5 units/ml Monitor platelets by anticoagulation protocol: Yes    Plan:  Restart heparin at  800 units/hr 6 hour Heparin level  Daily CBC  Rexford Maus, PharmD PGY-1 Acute Care Pharmacy Resident Office: [phone] 10/14/2020 1:34 PM

## 2020-10-14 NOTE — Progress Notes (Signed)
NAME:  Mark May, MRN:  [id number], DOB:  08-Jul-1956, LOS: 9 ADMISSION DATE:  10/18/2020, CONSULTATION DATE: 10/04/2020 REFERRING MD: Dr. Curtis Sites, CHIEF COMPLAINT: Left-sided weakness  Brief History   64 year old male with hypertension and hyperlipidemia who presented with left-sided weakness, noted to have right MCA occlusion status post thrombectomy and stent placement 11/5.  Hospitalization complicated by hypoxic respiratory failure secondary to pulmonary edema requiring BiPAP and lasix.  Awaiting CIR placement however on the evening of 10/10, he was noted to become more lethargic with tachycardia and tachypnea.  Had complained of abdominal pain with some distention and chest pain which resolved after burping.  KUB obtained which was normal.  On 11/11, some what more responsive but now with increasing sCr in which lisinopril was stopped and development of fever 102.6  TRH initially consulted for help with medical management however on their evaluation, PCCM consulted given ill appearance and developing hypotension and new onset Afib with RVR.  Past Medical History  Hypertension Peptic ulcer disease Gout BPH Sciatica   Significant Hospital Events   11/5 Mechanical thrombectomy of right MCA and stent 11/5 11/11 PCCM reconsulted   Consults:  IR Neurology PCCM 11/5- 11/8; 11/11 [facility]  Procedures:  11/6 right femoral sheath was removed  Significant Diagnostic Tests:  11/5 CT head: No acute abnormality. Minimal diffuse cerebral and cerebellar atrophy. Mild chronic small vessel white matter ischemic changes in both cerebral hemispheres  11/5 CTA head and neck: Severe distal right M1 stenosis or subocclusive thrombus, Right MCA infarct with extensive penumbra, 4 mm right supraclinoid ICA aneurysm. Widely patent cervical carotid and vertebral arteries.  11/5 postprocedure CT head: Hypodensity in the right lateral temporal lobe now shows mild hemorrhage or contrast enhancement. This is  most consistent with acute infarct. There has been interval stenting of the right middle cerebral artery.  11/6 MRI brain: 1. Fairly extensive patchy acute/early subacute infarcts within the right MCA/watershed territory.  2. Additional small acute/early subacute infarcts within the dorsal right thalamus and left basal ganglia. 3. Background moderate chronic small vessel ischemic disease.  11/6  MRA head: 1. Interval stenting of the M1/M2 right middle cerebral artery. Flow related signal is present proximal and distal to the stent suggestive of stent patency. 2. 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  11/5 DG Abd 1 View Gastric catheter within the stomach kinked at the proximal side port. This could be withdrawn slightly S necessary.   11/6 Transthoracic Echocardiogram  1. Left ventricular ejection fraction, by estimation, is 70 to 75%. The left ventricle has hyperdynamic function. The left ventricle has no regional wall motion abnormalities. Left ventricular diastolic parameters are consistent with Grade II diastolic dysfunction (pseudonormalization). Elevated left atrial pressure.  2. Right ventricular systolic function is normal. The right ventricular size is normal.  3. The mitral valve is normal in structure. Mild mitral valve regurgitation. No evidence of mitral stenosis.  4. The aortic valve is normal in structure. Aortic valve regurgitation is not visualized. No aortic stenosis is present.  5. The inferior vena cava is dilated in size with <50% respiratory variability, suggesting right atrial pressure of 15 mmHg.  11/10 KUB >> normal gas pattern  Micro Data:  11/5 MRSA PCR negative 11/10 UC >> 11/11 BCx2 >>  Antimicrobials:  11/5 cefazolin 11/11 Zosyn >  Interim history/subjective:  No events. Just got back from IR and has R abd drain with brown output. Sleepy, daughter and wife at bedside.  Objective  Blood pressure  139/84, pulse 93, temperature 98 F (36.7 C), temperature source Oral, resp. rate 20, weight 65 kg, SpO2 93 %.        Intake/Output Summary (Last 24 hours) at 10/14/2020 1656 Last data filed at 10/14/2020 1441 Gross per 24 hour  Intake 686.44 ml  Output 2200 ml  Net -1513.56 ml   Filed Weights   10/12/20 0500 10/13/20 0414  Weight: 56.5 kg 65 kg    Examination:   Constitutional: no acute distress Eyes: closed Ears, nose, mouth, and throat: NGT in place, MMM Cardiovascular: RRR, ext warm Respiratory: clear, less accessory muscle use Gastrointestinal: soft, +BS, RLQ drain with brown output Skin: mild redness on R hip Neurologic: sleeping Psychiatric: deferred  Renal function stable CBC stable CBG stable  Resolved Hospital Problem list    Assessment & Plan:   Sepsis due to right lower lobe pneumonia likely aspiration and retroperitoneal abscess ?perinephric Acute kidney injury- improved with IVF Metabolic encephalopathy- related to sepsis and increased metabolic demand Paroxysmal atrial fibrillation with rapid ventricular response 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

## 2020-10-14 NOTE — Progress Notes (Signed)
ANTICOAGULATION CONSULT NOTE   Pharmacy Consult for Heparin Indication: afib, s/p CVA  No Known Allergies  Patient Measurements: Weight: 65 kg (143 lb 4.8 oz) Heparin Dosing Weight:  56.7 kg  Vital Signs: Temp: 99.3 F (37.4 C) (11/14 2025) Temp Source: Axillary (11/14 2025) BP: 139/84 (11/14 1552) Pulse Rate: 103 (11/14 2025)  Labs: Recent Labs     0000 10/11/20 2107 10/12/20 0314 10/12/20 1044 10/13/20 1009 10/14/20 0116 10/14/20 2007  HGB   < >  --  11.7*  --  11.4* 10.7*  --   HCT  --   --  34.3*  --  32.4* 30.1*  --   PLT  --   --  202  --  222 197  --   HEPARINUNFRC  --   --  0.48   < > 0.45 <0.10* 0.24*  CREATININE  --   --  1.61*  --  1.40* 1.35*  --   CKTOTAL  --   --   --   --  991*  --   --   TROPONINIHS  --  35*  --   --   --   --   --    < > = values in this interval not displayed.    Estimated Creatinine Clearance: 50.2 mL/min (A) (by C-G formula based on SCr of 1.35 mg/dL (H)).  Assessment: CC/HPI: 35 YOM who presented on 11/5 with R MCA stroke s/p IR + stent placement for reocclusion   PMH: gout, HTN, BPH, sciatica, stomach ulcer s/p surgery   Anticoag: CVA s/p thrombectomy, stent placement. New onset afib.  On IV heparin. Aiming for lower goal of 0.3-0.5 d/t R MCA stroke. Heparin drip was held for IR drain placement heparin restarted 11/14.  Patient previously therapeutic on 800 units/hr  -heparin level= 0.24   Goal of Therapy:  Heparin level 0.3-0.5 units/ml Monitor platelets by anticoagulation protocol: Yes    Plan:  -Increase heparin to 850 units/hr -Heparin level and CBC in am  Harland German, PharmD Clinical Pharmacist **Pharmacist phone directory can now be found on [email or web address] ([email or web address]).  Listed under [facility].   e

## 2020-10-14 NOTE — Progress Notes (Signed)
RN received a call from the pharmacist to resume pt heparin drip. Heparin drip restarted as ordered and per protocol. Second RN Marylene Land second verified. Will continue to closely monitor pt. Dionne Bucy RN

## 2020-10-15 ENCOUNTER — Inpatient Hospital Stay (HOSPITAL_COMMUNITY): Payer: 59

## 2020-10-15 DIAGNOSIS — I10 Essential (primary) hypertension: Secondary | ICD-10-CM | POA: Diagnosis not present

## 2020-10-15 DIAGNOSIS — N151 Renal and perinephric abscess: Secondary | ICD-10-CM | POA: Diagnosis not present

## 2020-10-15 DIAGNOSIS — N179 Acute kidney failure, unspecified: Secondary | ICD-10-CM | POA: Diagnosis not present

## 2020-10-15 DIAGNOSIS — I4891 Unspecified atrial fibrillation: Secondary | ICD-10-CM | POA: Diagnosis not present

## 2020-10-15 DIAGNOSIS — I63131 Cerebral infarction due to embolism of right carotid artery: Secondary | ICD-10-CM | POA: Diagnosis not present

## 2020-10-15 DIAGNOSIS — I6601 Occlusion and stenosis of right middle cerebral artery: Secondary | ICD-10-CM | POA: Diagnosis not present

## 2020-10-15 LAB — CBC
HCT: 29.6 % — ABNORMAL LOW (ref 39.0–52.0)
Hemoglobin: 10.4 g/dL — ABNORMAL LOW (ref 13.0–17.0)
MCH: 28.9 pg (ref 26.0–34.0)
MCHC: 35.1 g/dL (ref 30.0–36.0)
MCV: 82.2 fL (ref 80.0–100.0)
Platelets: 254 10*3/uL (ref 150–400)
RBC: 3.6 MIL/uL — ABNORMAL LOW (ref 4.22–5.81)
RDW: 14.1 % (ref 11.5–15.5)
WBC: 11.5 10*3/uL — ABNORMAL HIGH (ref 4.0–10.5)
nRBC: 0 % (ref 0.0–0.2)

## 2020-10-15 LAB — OCCULT BLOOD X 1 CARD TO LAB, STOOL: Fecal Occult Bld: POSITIVE — AB

## 2020-10-15 LAB — HEPATIC FUNCTION PANEL
ALT: 70 U/L — ABNORMAL HIGH (ref 0–44)
AST: 134 U/L — ABNORMAL HIGH (ref 15–41)
Albumin: 1.3 g/dL — ABNORMAL LOW (ref 3.5–5.0)
Alkaline Phosphatase: 200 U/L — ABNORMAL HIGH (ref 38–126)
Bilirubin, Direct: 0.5 mg/dL — ABNORMAL HIGH (ref 0.0–0.2)
Indirect Bilirubin: 0.8 mg/dL (ref 0.3–0.9)
Total Bilirubin: 1.3 mg/dL — ABNORMAL HIGH (ref 0.3–1.2)
Total Protein: 5.1 g/dL — ABNORMAL LOW (ref 6.5–8.1)

## 2020-10-15 LAB — GLUCOSE, CAPILLARY
Glucose-Capillary: 139 mg/dL — ABNORMAL HIGH (ref 70–99)
Glucose-Capillary: 144 mg/dL — ABNORMAL HIGH (ref 70–99)
Glucose-Capillary: 116 mg/dL — ABNORMAL HIGH (ref 70–99)
Glucose-Capillary: 129 mg/dL — ABNORMAL HIGH (ref 70–99)
Glucose-Capillary: 157 mg/dL — ABNORMAL HIGH (ref 70–99)

## 2020-10-15 LAB — HEMOGLOBIN AND HEMATOCRIT, BLOOD
HCT: 32.1 % — ABNORMAL LOW (ref 39.0–52.0)
Hemoglobin: 11.4 g/dL — ABNORMAL LOW (ref 13.0–17.0)

## 2020-10-15 LAB — BASIC METABOLIC PANEL
Anion gap: 13 (ref 5–15)
BUN: 45 mg/dL — ABNORMAL HIGH (ref 8–23)
CO2: 20 mmol/L — ABNORMAL LOW (ref 22–32)
Calcium: 7.5 mg/dL — ABNORMAL LOW (ref 8.9–10.3)
Chloride: 116 mmol/L — ABNORMAL HIGH (ref 98–111)
Creatinine, Ser: 1.4 mg/dL — ABNORMAL HIGH (ref 0.61–1.24)
GFR, Estimated: 56 mL/min — ABNORMAL LOW (ref >60)
Glucose, Bld: 148 mg/dL — ABNORMAL HIGH (ref 70–99)
Potassium: 3.3 mmol/L — ABNORMAL LOW (ref 3.5–5.1)
Sodium: 149 mmol/L — ABNORMAL HIGH (ref 135–145)

## 2020-10-15 LAB — HEPARIN LEVEL (UNFRACTIONATED)
Heparin Unfractionated: 0.28 IU/mL — ABNORMAL LOW (ref 0.30–0.70)
Heparin Unfractionated: 0.3 IU/mL (ref 0.30–0.70)

## 2020-10-15 LAB — CK: Total CK: 630 U/L — ABNORMAL HIGH (ref 49–397)

## 2020-10-15 LAB — AMMONIA: Ammonia: 32 umol/L (ref 9–35)

## 2020-10-15 LAB — MAGNESIUM: Magnesium: 2.4 mg/dL (ref 1.7–2.4)

## 2020-10-15 LAB — PHOSPHORUS: Phosphorus: 3.7 mg/dL (ref 2.5–4.6)

## 2020-10-15 MED ORDER — SODIUM CHLORIDE 0.9 % IV SOLN: INTRAVENOUS | Status: DC | PRN

## 2020-10-15 MED ORDER — OSMOLITE 1.5 CAL PO LIQD
1000.0000 mL | ORAL | Status: DC
  Administered 2020-10-15: 1000 mL
  Filled 2020-10-15: qty 1000

## 2020-10-15 MED ORDER — QUETIAPINE FUMARATE 25 MG PO TABS
25.0000 mg | ORAL_TABLET | Freq: Every day | ORAL | Status: DC
  Administered 2020-10-15: 25 mg via ORAL
  Filled 2020-10-15 (×2): qty 1

## 2020-10-15 MED ORDER — PANTOPRAZOLE SODIUM 40 MG IV SOLR
40.0000 mg | Freq: Two times a day (BID) | INTRAVENOUS | Status: DC
  Administered 2020-10-15 – 2020-10-29 (×29): 40 mg via INTRAVENOUS
  Filled 2020-10-15 (×29): qty 40

## 2020-10-15 MED ORDER — POTASSIUM CHLORIDE 20 MEQ/15ML (10%) PO SOLN
40.0000 meq | ORAL | Status: AC
  Administered 2020-10-15 (×2): 40 meq
  Filled 2020-10-15 (×2): qty 30

## 2020-10-15 MED ORDER — PROSOURCE TF PO LIQD
45.0000 mL | Freq: Three times a day (TID) | ORAL | Status: DC
  Administered 2020-10-15 – 2020-10-16 (×3): 45 mL
  Filled 2020-10-15 (×5): qty 45

## 2020-10-15 MED ORDER — POTASSIUM CHLORIDE 10 MEQ/100ML IV SOLN
10.0000 meq | Freq: Once | INTRAVENOUS | Status: DC
  Administered 2020-10-15: 10 meq via INTRAVENOUS
  Filled 2020-10-15: qty 100

## 2020-10-15 MED ORDER — VANCOMYCIN HCL 500 MG/100ML IV SOLN
500.0000 mg | Freq: Two times a day (BID) | INTRAVENOUS | Status: DC
  Administered 2020-10-15 – 2020-10-17 (×4): 500 mg via INTRAVENOUS
  Filled 2020-10-15 (×5): qty 100

## 2020-10-15 NOTE — Progress Notes (Signed)
ANTICOAGULATION CONSULT NOTE   Pharmacy Consult for Heparin Indication: Afib, s/p CVA  No Known Allergies  Patient Measurements: Weight: 65 kg (143 lb 4.8 oz) Heparin Dosing Weight:  56.7 kg  Vital Signs: Temp: 98.2 F (36.8 C) (11/15 0357) Temp Source: Oral (11/15 0357) BP: 145/93 (11/15 0357) Pulse Rate: 90 (11/15 0357)  Labs: Recent Labs    10/13/20 1009 10/13/20 1009 10/14/20 0116 10/14/20 2007 10/15/20 0408  HGB 11.4*   < > 10.7*  --  10.4*  HCT 32.4*  --  30.1*  --  29.6*  PLT 222  --  197  --  254  HEPARINUNFRC 0.45  --  <0.10* 0.24*  --   CREATININE 1.40*  --  1.35*  --  1.40*  CKTOTAL 991*  --   --   --  630*   < > = values in this interval not displayed.    Estimated Creatinine Clearance: 48.4 mL/min (A) (by C-G formula based on SCr of 1.4 mg/dL (H)).  Assessment: 44 YOM who presented on 11/5 with R MCA stroke s/p IR + stent placement for reocclusion. Noted to have new onset Afib, pharmacy consulted to dose heparin.  Aiming for lower goal of 0.3-0.5 d/t R MCA stroke.  Heparin restarted on 11/14 s/p IR drain placement. Heparin level this morning remains slightly SUBtherapeutic after a rate increase yesterday evening (HL 0.28 << 0.24, goal of 0.3-0.5). CBC slight drop - some bloody/brain drainage noted - will monitor. No issues with the infusion noted per discussion with RN.  Goal of Therapy:  Heparin level 0.3-0.5 units/ml Monitor platelets by anticoagulation protocol: Yes    Plan:  - Increase Heparin to 900 units/hr (9 ml/hr) - Will continue to monitor for any signs/symptoms of bleeding and will follow up with heparin level in 6 hours   Thank you for allowing pharmacy to be a part of this patient's care.  Georgina Pillion, PharmD, BCPS Clinical Pharmacist Clinical phone for 10/15/2020: [phone] 10/15/2020 9:05 AM   **Pharmacist phone directory can now be found on [email or web address] ([email or web address]).  Listed under [facility].

## 2020-10-15 NOTE — Progress Notes (Signed)
Nutrition Follow-up  DOCUMENTATION CODES:   Severe malnutrition in context of chronic illness  INTERVENTION:   Recommend re-initiation of tube feeding via Cortrak: -Initiate Osmolite 1.5 @ 20 ml/hr and increase by 10 ml every 8 hours to goal rate of 50 ml/hr -45 ml Prosource TF TID  -200 ml free water flush every 4 hours  Tube feeding regimen would provide 1920 kcal (100% of needs), 108 grams of protein, and 914 ml of H2O. Total free water: 2114 ml/ day  Once feeding is initiated:  Monitor magnesium, potassium, and phosphorus daily for at least 3 days, MD to replete as needed, as pt is at risk for refeeding syndrome.    NUTRITION DIAGNOSIS:   Severe Malnutrition related to chronic illness (stomach ulcer s/p partial gastrectomy) as evidenced by moderate fat depletion, severe fat depletion, moderate muscle depletion, severe muscle depletion.  ongoing  GOAL:   Patient will meet greater than or equal to 90% of their needs  Not met  MONITOR:   Diet advancement, Labs, Weight trends, TF tolerance, Skin, I & O's  REASON FOR ASSESSMENT:   Rounds    ASSESSMENT:   Mark May is a 64 y.o. Asian male with PMH of hypertension, BPH, sciatica, stomach ulcer status post surgery in the past presented to ER for acute onset left-sided weakness, left facial droop and left hemianopia.  Pt admitted with rt MCA stroke.   11/5- s/p mechanical thrombectomy of right MCA and stent 11/8- s/p BSE- recommend continued NPO 11/9- s/p BSE- advanced to dysphagia 3 diet with nectar thick liquids 11/12- cortrak tube placed, tip of tube confirmed in stomach 11/14 CT guided drainage of R retroperitoneal abscess  Pt was on Cortrak list today despite having just had a Cortrak placed last Friday. Discussed with RN who was unable to provide any further information as to what happened with the Cortrak/tube feeding. RD visited pt today and found that Cortrak is functioning fine and has not moved from the  initial placement. Discovered TF orders had been discontinued on 11/13. Discussed re-initiating TF with MD. Per MD, there is concern for stomach perforation that caused retroperitoneal abscess and decision regarding TF is being deferred until after GI assessment of pt. Per GI's note from 11/15 @ 1:33, EGD is being deferred at this time and it is okay to advance diet per hospitalist team. Recommend resuming TF until pt is cleared by SLP for po diet and consuming enough po to meet his needs. Will leave recommendations above.  UOP: 195m x24 hours  Labs: Na 149 (H), K+ 3.3 (L), CBGs 139-144-116 Medications: ss novolog, protonix, 2014mfree water Q4H   Diet Order:   Diet Order            Diet NPO time specified Except for: Other (See Comments)  Diet effective now                 EDUCATION NEEDS:   Education needs have been addressed  Skin:  Skin Assessment: Reviewed RN Assessment  Last BM:  10/11/20  Height:   Ht Readings from Last 1 Encounters:  10/10/2020 5' 7" (1.702 m)    Weight:   Wt Readings from Last 1 Encounters:  10/13/20 65 kg    Ideal Body Weight:  67.3 kg  BMI:  Body mass index is 22.44 kg/m.  Estimated Nutritional Needs:   Kcal:  1800-2000  Protein:  100-115 grams  Fluid:  > 1.8 L    AmLarkin InaMS, RD, LDN  RD pager number and weekend/on-call pager number located in [place].

## 2020-10-15 NOTE — Consult Note (Addendum)
Referring Provider: Dr. Marvel Plan Primary Care Physician:  Reubin Milan, MD Primary Gastroenterologist:  Gentry Fitz  Reason for Consultation:  Melena  HPI: Mark May is a 64 y.o. male with remote partial gastrectomy for PUD with recent stroke s/p thrombectomy and right MCA stent on 11/5 with left hemiplegia, A. fib (on heparin as an inpatient), sepsis, and right perinephric abscess presenting for consultation of anemia and melena.  Patient seen at bedside, wife present.  Patient denies any abdominal pain.  Patient's wife states patient has been having black stools since Saturday 11/13, which started after patient was given bowel regimen for constipation.  Stools are now soft/loose.  Patient denies any constipation or black stools at home.  Patient denies any recent hematochezia.  Patient denies nausea, vomiting, or hematemesis.  Patient states he is hungry and requests something to eat.  Patient had a colonoscopy 12/2013, which was normal per chart review.  [employer] video interpreting services Delano Metz [phone] utilized for this encounter.  Past Medical History:  Diagnosis Date  . Gout   . Hypertension     Past Surgical History:  Procedure Laterality Date  . PARTIAL GASTRECTOMY  1990   done in [place] for PUD  . PROSTATE BIOPSY  ~ 2005   negative for cancer (done in [place])  . RADIOLOGY WITH ANESTHESIA N/A 10/21/2020   Procedure: IR WITH ANESTHESIA;  Surgeon: Radiologist, Medication, MD;  Location: [facility] OR;  Service: Radiology;  Laterality: N/A;    Prior to Admission medications   Medication Sig Start Date End Date Taking? Authorizing Provider  allopurinol (ZYLOPRIM) 100 MG tablet Take 3 tablets (300 mg total) by mouth daily. Patient not taking: Reported on 10/09/2020 02/17/18   Reubin Milan, MD  amLODipine (NORVASC) 10 MG tablet Take 1 tablet (10 mg total) by mouth daily. Patient not taking: Reported on 10/09/2020 02/17/18   Reubin Milan, MD  tamsulosin [facility] 0.4 MG CAPS  capsule Take 1 capsule (0.4 mg total) by mouth daily after supper. Patient not taking: Reported on 10/09/2020 03/09/18   Reubin Milan, MD    Scheduled Meds: .  stroke: mapping our early stages of recovery book   Does not apply Once  . [START ON 10/20/2020] amiodarone  200 mg Per Tube Daily   Followed by  . amiodarone  200 mg Per Tube BID  . chlorhexidine  15 mL Mouth Rinse BID  . Chlorhexidine Gluconate Cloth  6 each Topical Daily  . free water  200 mL Per Tube Q4H  . insulin aspart  0-9 Units Subcutaneous Q4H  . mouth rinse  15 mL Mouth Rinse q12n4p  . pantoprazole (PROTONIX) IV  40 mg Intravenous Q12H  . sodium chloride flush  5 mL Intracatheter Q8H  . ticagrelor  90 mg Oral BID   Or  . ticagrelor  90 mg Per Tube BID   Continuous Infusions: . sodium chloride 50 mL/hr at 10/15/20 1332  . heparin 900 Units/hr (10/15/20 1332)  . piperacillin-tazobactam (ZOSYN)  IV Stopped (10/15/20 1330)  . vancomycin     PRN Meds:.sodium chloride, acetaminophen **OR** acetaminophen (TYLENOL) oral liquid 160 mg/5 mL **OR** acetaminophen, bisacodyl, Resource ThickenUp Clear, senna-docusate  Allergies as of 10/28/2020  . (No Known Allergies)    Family History  Problem Relation Age of Onset  . Diabetes Mother   . Diabetes Father     Social History   Socioeconomic History  . Marital status: Married    Spouse name: Not on file  .  Number of children: Not on file  . Years of education: Not on file  . Highest education level: Not on file  Occupational History  . Not on file  Tobacco Use  . Smoking status: Never Smoker  . Smokeless tobacco: Never Used  Vaping Use  . Vaping Use: Never used  Substance and Sexual Activity  . Alcohol use: No    Alcohol/week: 0.0 standard drinks  . Drug use: No  . Sexual activity: Not on file  Other Topics Concern  . Not on file  Social History Narrative  . Not on file   Social Determinants of Health   Financial Resource Strain:   . Difficulty of  Paying Living Expenses: Not on file  Food Insecurity:   . Worried About Programme researcher, broadcasting/film/video in the Last Year: Not on file  . Ran Out of Food in the Last Year: Not on file  Transportation Needs:   . Lack of Transportation (Medical): Not on file  . Lack of Transportation (Non-Medical): Not on file  Physical Activity:   . Days of Exercise per Week: Not on file  . Minutes of Exercise per Session: Not on file  Stress:   . Feeling of Stress : Not on file  Social Connections:   . Frequency of Communication with Friends and Family: Not on file  . Frequency of Social Gatherings with Friends and Family: Not on file  . Attends Religious Services: Not on file  . Active Member of Clubs or Organizations: Not on file  . Attends Banker Meetings: Not on file  . Marital Status: Not on file  Intimate Partner Violence:   . Fear of Current or Ex-Partner: Not on file  . Emotionally Abused: Not on file  . Physically Abused: Not on file  . Sexually Abused: Not on file    Review of Systems: Review of Systems  Constitutional: Positive for malaise/fatigue. Negative for fever.  HENT: Negative for hearing loss and tinnitus.   Eyes: Negative for pain and redness.  Respiratory: Positive for shortness of breath. Negative for cough.   Cardiovascular: Negative for chest pain and palpitations.  Gastrointestinal: Positive for constipation and melena. Negative for abdominal pain, blood in stool, diarrhea, heartburn, nausea and vomiting.  Genitourinary: Negative for dysuria and flank pain.  Musculoskeletal: Negative for falls and joint pain.  Skin: Negative for itching and rash.     Physical Exam: Vital signs: Vitals:   10/15/20 0903 10/15/20 1227  BP: (!) 152/80 (!) 154/79  Pulse: 80 84  Resp: (!) 24 (!) 24  Temp: 98.6 F (37 C) 98.1 F (36.7 C)  SpO2: 97%    Last BM Date: 10/14/20 Physical Exam Vitals reviewed.  Constitutional:      General: He is not in acute distress.     Appearance: He is ill-appearing.     Interventions: Nasal cannula in place.  HENT:     Head: Normocephalic and atraumatic.     Nose: Nose normal. No congestion.     Mouth/Throat:     Mouth: Mucous membranes are moist.     Pharynx: Oropharynx is clear.  Eyes:     General: No scleral icterus.    Extraocular Movements: Extraocular movements intact.     Conjunctiva/sclera: Conjunctivae normal.  Cardiovascular:     Rate and Rhythm: Normal rate and regular rhythm.     Pulses: Normal pulses.     Heart sounds: Normal heart sounds.  Pulmonary:     Effort: Tachypnea present.  No respiratory distress.     Breath sounds: Normal breath sounds.  Abdominal:     General: Bowel sounds are normal. There is no distension.     Palpations: Abdomen is soft. There is no mass.     Tenderness: There is no abdominal tenderness. There is no guarding or rebound.     Hernia: No hernia is present.     Comments: Right-sided JP drain with brownish liquid output  Musculoskeletal:        General: No swelling or tenderness.     Cervical back: Normal range of motion and neck supple.  Skin:    General: Skin is warm and dry.  Neurological:     General: No focal deficit present.     Mental Status: He is oriented to person, place, and time. He is lethargic.  Psychiatric:        Mood and Affect: Mood normal.        Behavior: Behavior normal. Behavior is cooperative.      GI:  Lab Results: Recent Labs    10/13/20 1009 10/14/20 0116 10/15/20 0408  WBC 9.4 9.7 11.5*  HGB 11.4* 10.7* 10.4*  HCT 32.4* 30.1* 29.6*  PLT 222 197 254   BMET Recent Labs    10/13/20 1009 10/14/20 0116 10/15/20 0408  NA 143 145 149*  K 3.1* 3.5 3.3*  CL 111 113* 116*  CO2 20* 22 20*  GLUCOSE 194* 151* 148*  BUN 43* 42* 45*  CREATININE 1.40* 1.35* 1.40*  CALCIUM 7.5* 7.5* 7.5*   LFT Recent Labs    10/15/20 0408  PROT 5.1*  ALBUMIN 1.3*  AST 134*  ALT 70*  ALKPHOS 200*  BILITOT 1.3*  BILIDIR 0.5*  IBILI 0.8    PT/INR No results for input(s): LABPROT, INR in the last 72 hours.   Studies/Results: CT ABDOMEN PELVIS WO CONTRAST  Result Date: 10/13/2020 CLINICAL DATA:  64 year old male with abdominal abscess or infection. EXAM: CT ABDOMEN AND PELVIS WITHOUT CONTRAST TECHNIQUE: Multidetector CT imaging of the abdomen and pelvis was performed following the standard protocol without IV contrast. COMPARISON:  Renal ultrasound dated 10/11/2020. FINDINGS: Evaluation of this exam is limited in the absence of intravenous contrast. Lower chest: Partially visualized small bilateral pleural effusions with complete consolidative changes of the visualized lower lobes. No intra-abdominal free air. There is a small free fluid in the pelvis. Hepatobiliary: The liver is unremarkable. No intrahepatic biliary dilatation. There are multiple stones in the gallbladder. No pericholecystic fluid or evidence of acute cholecystitis by CT. Pancreas: Unremarkable. No pancreatic ductal dilatation or surrounding inflammatory changes. Spleen: Normal in size without focal abnormality. Adrenals/Urinary Tract: The adrenal glands unremarkable. Multiple nonobstructing right renal calculi measure up to 4 mm in the inferior pole of the right kidney. No hydronephrosis. There is no hydronephrosis or nephrolithiasis on the left. Multiple bilateral renal hypodense lesions are not characterized on this noncontrast CT. There is fluid collection with scattered pockets of air in the right perinephric space and along the right psoas muscle extending down into the pelvis. The fluid collection measures 7.4 x 4.3 cm in greatest axial dimensions and 17 cm in craniocaudal length. There is air within the urinary bladder, possibly related to recent instrumentation. An infectious process is not excluded. Stomach/Bowel: A feeding tube is noted with tip appearing in the distal stomach. Evaluation however is limited due to respiratory motion. Contrast is noted in the  colon. There is distal colonic diverticulosis without active inflammatory changes. No bowel obstruction. The  appendix is not visualized with certainty. Vascular/Lymphatic: Moderate aortoiliac atherosclerotic disease. The IVC is unremarkable. No portal venous gas. There is no adenopathy. Reproductive: The prostate and seminal vesicles are grossly unremarkable. Other: There is mild diffuse subcutaneous edema. No fluid collection. Musculoskeletal: No acute or significant osseous findings. IMPRESSION: 1. Large right perinephric fluid collection and pockets of air concerning for an infectious process/abscess. Clinical correlation is recommended. 2. Cholelithiasis. 3. Nonobstructing right renal calculi. No hydronephrosis. 4. Colonic diverticulosis. No bowel obstruction. 5. Partially visualized small bilateral pleural effusions with complete consolidative changes of the visualized lower lobes. 6. Aortic Atherosclerosis (ICD10-I70.0). Electronically Signed   By: Elgie CollardArash  Radparvar M.D.   On: 10/13/2020 16:42   DG Abd 1 View  Result Date: 10/15/2020 CLINICAL DATA:  Abdominal pain EXAM: ABDOMEN - 1 VIEW COMPARISON:  Abdomen radiograph October 13, 2020 and CT abdomen and pelvis October 13, 2020 FINDINGS: There is a drain in the right lower quadrant. Feeding tube tip is in the distal stomach region. There are foci of contrast in colon and appendix. There is no bowel dilatation or air-fluid level to suggest bowel obstruction. No free air is appreciable on supine examination. IMPRESSION: No bowel obstruction. No free air evident on supine examination. Tube and catheter positions as noted. Electronically Signed   By: Bretta BangWilliam  Woodruff III M.D.   On: 10/15/2020 13:07   DG Abd 1 View  Result Date: 10/13/2020 CLINICAL DATA:  Abdominal distension EXAM: ABDOMEN - 1 VIEW COMPARISON:  October 10, 2020 FINDINGS: Flocculated enteric contrast is seen within the colon. Moderate predominately RIGHT-sided colonic stool burden.  Gaseous distension of loops of colon without dilation. Feeding tube tip terminates over the distal stomach. Probable nephrolithiasis. Visualized osseous structures are unremarkable. IMPRESSION: Feeding tube tip terminates over the distal stomach. Electronically Signed   By: Meda KlinefelterStephanie  Peacock MD   On: 10/13/2020 14:30   CT IMAGE GUIDED DRAINAGE BY PERCUTANEOUS CATHETER  Result Date: 10/14/2020 CLINICAL DATA:  Large retroperitoneal abscess centered primarily inferior to the right kidney. EXAM: CT GUIDED CATHETER DRAINAGE OF RIGHT RETROPERITONEAL ABSCESS ANESTHESIA/SEDATION: 2.0 mg IV Versed 100 mcg IV Fentanyl Total Moderate Sedation Time:  12 minutes The patient's level of consciousness and physiologic status were continuously monitored during the procedure by Radiology nursing. PROCEDURE: The procedure, risks, benefits, and alternatives were explained to the patient. Questions regarding the procedure were encouraged and answered. The patient understands and consents to the procedure. A foreign language interpreter was utilized for consent. A time out was performed prior to initiating the procedure. CT was performed through the abdomen in a prone position. The right translumbar region was prepped with chlorhexidine in a sterile fashion, and a sterile drape was applied covering the operative field. A sterile gown and sterile gloves were used for the procedure. Local anesthesia was provided with 1% Lidocaine. Under CT guidance, an 18 gauge trocar needle was advanced into the right retroperitoneal space. After return of fluid, a guidewire was advanced. The tract was dilated and a 12 French percutaneous drainage catheter placed. Additional fluid sample was aspirated from the drain and sent for culture analysis. Catheter position was confirmed by CT. The catheter was attached to suction bulb drainage. It was secured at the skin with a Prolene retention suture and StatLock device. COMPLICATIONS: None FINDINGS: The  retroperitoneal collection yielded reddish brown fluid. After drain placement there is copious return of fluid. IMPRESSION: CT-guided percutaneous catheter drainage of right retroperitoneal abscess yielding reddish brown fluid. A fluid sample was sent for culture  analysis. A 12 French drainage catheter was placed and attached to suction bulb drainage. Electronically Signed   By: Irish Lack M.D.   On: 10/14/2020 12:47    Impression: Melena, anemia: gastritis vs. PUD -Hgb 10.4 today, which has gradually decreased over the last 5 days -Hemodynamically stable: Normotensive to hypertensive with normal HR  Recent stroke s/p thrombectomy and right MCA stent on 11/5 with left hemiplegia  A. fib (on heparin as an inpatient)  Sepsis, right perinephric abscess   AKI: BUN 45/Cr 1.40  Plan: Initiate Protonix 40 mg IV twice daily.  We will defer EGD at this time, given hemodynamic stability, and patient's recent stroke and current perinephric abscess.  Okay to advance diet per hospitalist team.  Continue to monitor H&H with transfusion as needed to maintain hemoglobin greater than 7.  Follow up with primary GI doctor (Dr. Servando Snare) as an outpatient.   LOS: 10 days   Edrick Kins  PA-C 10/15/2020, 1:33 PM  Contact #  [phone]

## 2020-10-15 NOTE — Progress Notes (Signed)
ANTICOAGULATION CONSULT NOTE   Pharmacy Consult for Heparin Indication: Afib, s/p CVA  No Known Allergies  Patient Measurements: Weight: 65 kg (143 lb 4.8 oz) Heparin Dosing Weight:  56.7 kg  Vital Signs: Temp: 98.7 F (37.1 C) (11/15 1500) Temp Source: Oral (11/15 1500) BP: 137/70 (11/15 1500) Pulse Rate: 81 (11/15 1500)  Labs: Recent Labs    10/13/20 1009 10/13/20 1009 10/14/20 0116 10/14/20 0116 10/14/20 2007 10/15/20 0408 10/15/20 0757 10/15/20 1613  HGB 11.4*   < > 10.7*  --   --  10.4*  --   --   HCT 32.4*  --  30.1*  --   --  29.6*  --   --   PLT 222  --  197  --   --  254  --   --   HEPARINUNFRC 0.45   < > <0.10*   < > 0.24*  --  0.28* 0.30  CREATININE 1.40*  --  1.35*  --   --  1.40*  --   --   CKTOTAL 991*  --   --   --   --  630*  --   --    < > = values in this interval not displayed.    Estimated Creatinine Clearance: 48.4 mL/min (A) (by C-G formula based on SCr of 1.4 mg/dL (H)).  Assessment: 19 YOM who presented on 11/5 with R MCA stroke s/p IR + stent placement for reocclusion. Noted to have new onset Afib, pharmacy consulted to dose heparin.  Aiming for lower goal of 0.3-0.5 d/t R MCA stroke. Heparin restarted on 11/14 s/p IR drain placement.   Patient found to have dark stools this morning and positive fecal occult. GI consulted and recommended IV PPI q12h. Spoke to RN and she reports stools are black. Heparin level of 0.3 is therapeutic on heparin 900 units/hr though at lower end of range.   Spoke with Dr. Roda Shutters and given black stools will stop heparin and recheck CBC tomorrow.   Goal of Therapy:  Heparin level 0.3-0.5 units/ml Monitor platelets by anticoagulation protocol: Yes    Plan:  Stop heparin per Dr. Roda Shutters Recheck CBC and follow up bloody stools tomorrow morning to assess plan for heparin  Thank you for allowing pharmacy to be a part of this patient's care.  Gerrit Halls, PharmD Clinical Pharmacist  10/15/2020 5:22 PM

## 2020-10-15 NOTE — Progress Notes (Signed)
Physical Therapy Treatment Patient Details Name: Mark May MRN: [id number] DOB: 25-Jan-1956 Today's Date: 10/15/2020    History of Present Illness  64 y.o. male admitted with L hemianopsia, L facial droop and L side weakness s/p R MCA revacularization on 11/5 with post procedure intubation    PT Comments    PT and OT saw pt in conjunction to progress pt mobility and maximize therapeutic impact. Pt overall requiring max-total +2 assist for repeated sit to stands and bed mobility tasks this day. Pt with some improvement in ability to complete rolling tasks, especially towards R, once pt understands sequencing and instructions of task. Pt sat EOB x10 minutes with fluctuating level of posterior truncal assist, pt with brief periods of min guard and SL support when cued to maintain own balance. Pt placed in therapeutic chair position in bed for pulmonary health, pressure relief, and tolerance for upright. PT to continue to recommend CIR and follow acutely.     Follow Up Recommendations  CIR;Supervision/Assistance - 24 hour     Equipment Recommendations  3in1 (PT);Wheelchair (measurements PT);Wheelchair cushion (measurements PT);Hospital bed (may change as pt progresses)    Recommendations for Other Services Rehab consult     Precautions / Restrictions Precautions Precautions: Fall Precaution Comments: L side hemiparesis; monitor BP; watch O2 Restrictions Weight Bearing Restrictions: No    Mobility  Bed Mobility Overal bed mobility: Needs Assistance Bed Mobility: Sit to Supine;Rolling;Sidelying to Sit Rolling: Max assist;+2 for physical assistance;Mod assist Sidelying to sit: +2 for physical assistance;Total assist Supine to sit: +2 for physical assistance;Max assist Sit to supine: Total assist;+2 for physical assistance;+2 for safety/equipment   General bed mobility comments: Mod assist for roll to L with repeated multimodal cuing for using RUE to pull on L bedrail to facilitate  rolling motion; max +2 for sidelying<>sit to manage BLEs and trunk, scooting to and from EOB.  Transfers Overall transfer level: Needs assistance Equipment used: 2 person hand held assist Transfers: Sit to/from Stand Sit to Stand: Total assist;+2 physical assistance         General transfer comment: x2 sit<>stands from EOB with total A +2. pt unable to pivot to recliner this session secondary to reports of fatigue and dizziness although BP WFL.  Ambulation/Gait             General Gait Details: unable to attempt today   Stairs             Wheelchair Mobility    Modified Rankin (Stroke Patients Only) Modified Rankin (Stroke Patients Only) Pre-Morbid Rankin Score: No symptoms Modified Rankin: Severe disability     Balance Overall balance assessment: Needs assistance Sitting-balance support: Bilateral upper extremity supported;Feet supported Sitting balance-Leahy Scale: Poor Sitting balance - Comments: Fair to poor sitting balance, with evolving level of posterior assist (min guard-mod assist) needed. Pt using RUE on EOB and bedrail to support self intermittently Postural control: Posterior lean Standing balance support: Single extremity supported Standing balance-Leahy Scale: Zero Standing balance comment: total +2                            Cognition Arousal/Alertness: Awake/alert Behavior During Therapy: Flat affect Overall Cognitive Status: Difficult to assess                                 General Comments: requires multimodal, repeated cuing to attend to L side of  body, to follow simple commands.      Exercises      General Comments General comments (skin integrity, edema, etc.): Mandarin interpreter on stratus: Amy [phone] (wife and husband understand and can communicate in mandarin but prefer to converse with each other in their dialect); pt dizzy at EOB, SpO2 146/79. SpO2 86% on RA when pt took [place] off, returned >90% on  4LO2.      Pertinent Vitals/Pain Pain Assessment: Faces Faces Pain Scale: No hurt Pain Intervention(s): Limited activity within patient's tolerance;Monitored during session    Home Living                      Prior Function            PT Goals (current goals can now be found in the care plan section) Acute Rehab PT Goals Patient Stated Goal: to improve PT Goal Formulation: With patient Time For Goal Achievement: 10/23/2020 Potential to Achieve Goals: Fair Progress towards PT goals: Progressing toward goals    Frequency    Min 4X/week      PT Plan Current plan remains appropriate    Co-evaluation PT/OT/SLP Co-Evaluation/Treatment: Yes Reason for Co-Treatment: For patient/therapist safety;To address functional/ADL transfers PT goals addressed during session: Mobility/safety with mobility;Balance;Strengthening/ROM OT goals addressed during session: ADL's and self-care      AM-PAC PT "6 Clicks" Mobility   Outcome Measure  Help needed turning from your back to your side while in a flat bed without using bedrails?: A Lot Help needed moving from lying on your back to sitting on the side of a flat bed without using bedrails?: Total Help needed moving to and from a bed to a chair (including a wheelchair)?: Total Help needed standing up from a chair using your arms (e.g., wheelchair or bedside chair)?: Total Help needed to walk in hospital room?: Total Help needed climbing 3-5 steps with a railing? : Total 6 Click Score: 7    End of Session Equipment Utilized During Treatment: Gait belt;Oxygen Activity Tolerance: Patient limited by fatigue Patient left: with call bell/phone within reach;in bed;with nursing/sitter in room;with family/visitor present;with SCD's reapplied (RN to set bed alarm) Nurse Communication: Mobility status PT Visit Diagnosis: Other abnormalities of gait and mobility (R26.89);Muscle weakness (generalized) (M62.81);Hemiplegia and  hemiparesis;Unsteadiness on feet (R26.81);Difficulty in walking, not elsewhere classified (R26.2);Other symptoms and signs involving the nervous system (R29.898) Hemiplegia - Right/Left: Left Hemiplegia - dominant/non-dominant: Non-dominant Hemiplegia - caused by: Cerebral infarction     Time: 0921-0956 PT Time Calculation (min) (ACUTE ONLY): 35 min  Charges:  $Neuromuscular Re-education: 8-22 mins                    Mark May E, PT Acute Rehabilitation Services Pager [phone]  Office [phone]   Mark May 10/15/2020, 11:07 AM

## 2020-10-15 NOTE — Progress Notes (Signed)
Referring Physician(s): Dr Adaline Sill  Supervising Physician: Dr Marliss Coots  Patient Status:  [facility] - In-pt  Chief Complaint:  Right perinephric abscess drain placed in IR 11/14  Subjective:  Up on side of bed with help today Weak; dizzy some Family at bedside Rt perinephric drain in place   Allergies: Patient has no known allergies.  Medications: Prior to Admission medications   Medication Sig Start Date End Date Taking? Authorizing Provider  allopurinol (ZYLOPRIM) 100 MG tablet Take 3 tablets (300 mg total) by mouth daily. Patient not taking: Reported on 10/09/2020 02/17/18   Reubin Milan, MD  amLODipine (NORVASC) 10 MG tablet Take 1 tablet (10 mg total) by mouth daily. Patient not taking: Reported on 10/09/2020 02/17/18   Reubin Milan, MD  tamsulosin [facility]) 0.4 MG CAPS capsule Take 1 capsule (0.4 mg total) by mouth daily after supper. Patient not taking: Reported on 10/09/2020 03/09/18   Reubin Milan, MD     Vital Signs: BP (!) 152/80 (BP Location: Right Arm)   Pulse 80   Temp 98.6 F (37 C) (Oral)   Resp (!) 24   Wt 143 lb 4.8 oz (65 kg)   SpO2 97%   BMI 22.44 kg/m   Physical Exam Vitals reviewed.  Skin:    General: Skin is warm.     Comments: Site is cleanand dry NT no bleeding OP thin brown fluid  865 cc yesterday Cx pending     Imaging: CT ABDOMEN PELVIS WO CONTRAST  Result Date: 10/13/2020 CLINICAL DATA:  64 year old male with abdominal abscess or infection. EXAM: CT ABDOMEN AND PELVIS WITHOUT CONTRAST TECHNIQUE: Multidetector CT imaging of the abdomen and pelvis was performed following the standard protocol without IV contrast. COMPARISON:  Renal ultrasound dated 10/11/2020. FINDINGS: Evaluation of this exam is limited in the absence of intravenous contrast. Lower chest: Partially visualized small bilateral pleural effusions with complete consolidative changes of the visualized lower lobes. No intra-abdominal free air. There is a  small free fluid in the pelvis. Hepatobiliary: The liver is unremarkable. No intrahepatic biliary dilatation. There are multiple stones in the gallbladder. No pericholecystic fluid or evidence of acute cholecystitis by CT. Pancreas: Unremarkable. No pancreatic ductal dilatation or surrounding inflammatory changes. Spleen: Normal in size without focal abnormality. Adrenals/Urinary Tract: The adrenal glands unremarkable. Multiple nonobstructing right renal calculi measure up to 4 mm in the inferior pole of the right kidney. No hydronephrosis. There is no hydronephrosis or nephrolithiasis on the left. Multiple bilateral renal hypodense lesions are not characterized on this noncontrast CT. There is fluid collection with scattered pockets of air in the right perinephric space and along the right psoas muscle extending down into the pelvis. The fluid collection measures 7.4 x 4.3 cm in greatest axial dimensions and 17 cm in craniocaudal length. There is air within the urinary bladder, possibly related to recent instrumentation. An infectious process is not excluded. Stomach/Bowel: A feeding tube is noted with tip appearing in the distal stomach. Evaluation however is limited due to respiratory motion. Contrast is noted in the colon. There is distal colonic diverticulosis without active inflammatory changes. No bowel obstruction. The appendix is not visualized with certainty. Vascular/Lymphatic: Moderate aortoiliac atherosclerotic disease. The IVC is unremarkable. No portal venous gas. There is no adenopathy. Reproductive: The prostate and seminal vesicles are grossly unremarkable. Other: There is mild diffuse subcutaneous edema. No fluid collection. Musculoskeletal: No acute or significant osseous findings. IMPRESSION: 1. Large right perinephric fluid collection and pockets of  air concerning for an infectious process/abscess. Clinical correlation is recommended. 2. Cholelithiasis. 3. Nonobstructing right renal calculi. No  hydronephrosis. 4. Colonic diverticulosis. No bowel obstruction. 5. Partially visualized small bilateral pleural effusions with complete consolidative changes of the visualized lower lobes. 6. Aortic Atherosclerosis (ICD10-I70.0). Electronically Signed   By: Elgie Collard M.D.   On: 10/13/2020 16:42   DG Abd 1 View  Result Date: 10/13/2020 CLINICAL DATA:  Abdominal distension EXAM: ABDOMEN - 1 VIEW COMPARISON:  October 10, 2020 FINDINGS: Flocculated enteric contrast is seen within the colon. Moderate predominately RIGHT-sided colonic stool burden. Gaseous distension of loops of colon without dilation. Feeding tube tip terminates over the distal stomach. Probable nephrolithiasis. Visualized osseous structures are unremarkable. IMPRESSION: Feeding tube tip terminates over the distal stomach. Electronically Signed   By: Meda Klinefelter MD   On: 10/13/2020 14:30   US RENAL  Result Date: 10/11/2020 CLINICAL DATA:  Acute renal insufficiency EXAM: RENAL / URINARY TRACT ULTRASOUND COMPLETE COMPARISON:  None. FINDINGS: Right Kidney: Renal measurements: 10.5 x 5.4 x 4.9 cm = volume: 145 mL. Echogenicity within normal limits. No mass or hydronephrosis visualized. 1.8 cm simple exophytic cortical cyst noted. Left Kidney: Renal measurements: 11.4 x 6.2 x 4.8 cm = volume: 176 mL. Echogenicity within normal limits. No mass or hydronephrosis visualized. 2.7 cm exophytic simple cortical cyst noted. Bladder: Decompressed and not well visualized. Other: None. IMPRESSION: Normal renal sonogram Electronically Signed   By: Helyn Numbers MD   On: 10/11/2020 21:01   DG Chest Port 1 View  Result Date: 10/13/2020 CLINICAL DATA:  Respiratory failure. EXAM: PORTABLE CHEST 1 VIEW COMPARISON:  09/10/2020 FINDINGS: 0515 hours. Low lung volumes. Retrocardiac left base collapse/consolidation again noted with diffuse interstitial and bilateral airspace disease. Small bilateral effusions persist. A feeding tube passes into the  stomach although the distal tip position is not included on the film. Telemetry leads overlie the chest. IMPRESSION: Low volume film with persistent retrocardiac left base collapse/consolidation and diffuse bilateral airspace disease. Electronically Signed   By: Kennith Center M.D.   On: 10/13/2020 08:12   DG Chest Port 1 View  Result Date: 10/11/2020 CLINICAL DATA:  Sepsis EXAM: PORTABLE CHEST 1 VIEW COMPARISON:  10/09/2020 FINDINGS: Two frontal views of the chest demonstrate an enlarged cardiac silhouette. Lung volumes are diminished, with new left basilar consolidation. Small bilateral pleural effusions are identified. No pneumothorax. IMPRESSION: 1. New left basilar consolidation which could reflect atelectasis or pneumonia. 2. New small bilateral pleural effusions, left greater than right. Electronically Signed   By: Sharlet Salina M.D.   On: 10/11/2020 17:46   CT IMAGE GUIDED DRAINAGE BY PERCUTANEOUS CATHETER  Result Date: 10/14/2020 CLINICAL DATA:  Large retroperitoneal abscess centered primarily inferior to the right kidney. EXAM: CT GUIDED CATHETER DRAINAGE OF RIGHT RETROPERITONEAL ABSCESS ANESTHESIA/SEDATION: 2.0 mg IV Versed 100 mcg IV Fentanyl Total Moderate Sedation Time:  12 minutes The patient's level of consciousness and physiologic status were continuously monitored during the procedure by Radiology nursing. PROCEDURE: The procedure, risks, benefits, and alternatives were explained to the patient. Questions regarding the procedure were encouraged and answered. The patient understands and consents to the procedure. A foreign language interpreter was utilized for consent. A time out was performed prior to initiating the procedure. CT was performed through the abdomen in a prone position. The right translumbar region was prepped with chlorhexidine in a sterile fashion, and a sterile drape was applied covering the operative field. A sterile gown and sterile gloves were used for  the procedure.  Local anesthesia was provided with 1% Lidocaine. Under CT guidance, an 18 gauge trocar needle was advanced into the right retroperitoneal space. After return of fluid, a guidewire was advanced. The tract was dilated and a 12 French percutaneous drainage catheter placed. Additional fluid sample was aspirated from the drain and sent for culture analysis. Catheter position was confirmed by CT. The catheter was attached to suction bulb drainage. It was secured at the skin with a Prolene retention suture and StatLock device. COMPLICATIONS: None FINDINGS: The retroperitoneal collection yielded reddish brown fluid. After drain placement there is copious return of fluid. IMPRESSION: CT-guided percutaneous catheter drainage of right retroperitoneal abscess yielding reddish brown fluid. A fluid sample was sent for culture analysis. A 12 French drainage catheter was placed and attached to suction bulb drainage. Electronically Signed   By: Irish Lack M.D.   On: 10/14/2020 12:47   [place] EKG SITE RITE  Result Date: 10/13/2020 If Site Rite image not attached, placement could not be confirmed due to current cardiac rhythm.   Labs:  CBC: Recent Labs    10/12/20 0314 10/13/20 1009 10/14/20 0116 10/15/20 0408  WBC 5.8 9.4 9.7 11.5*  HGB 11.7* 11.4* 10.7* 10.4*  HCT 34.3* 32.4* 30.1* 29.6*  PLT 202 222 197 254    COAGS: Recent Labs    10/04/2020 1246 10/11/20 1706  INR 0.9 1.1  APTT 25 41*    BMP: Recent Labs    10/12/20 0314 10/13/20 1009 10/14/20 0116 10/15/20 0408  NA 143 143 145 149*  K 3.8 3.1* 3.5 3.3*  CL 113* 111 113* 116*  CO2 19* 20* 22 20*  GLUCOSE 132* 194* 151* 148*  BUN 42* 43* 42* 45*  CALCIUM 8.0* 7.5* 7.5* 7.5*  CREATININE 1.61* 1.40* 1.35* 1.40*  GFRNONAA 47* 56* 58* 56*    LIVER FUNCTION TESTS: Recent Labs    10/08/20 0727 10/11/20 1706 10/13/20 1009 10/15/20 0408  BILITOT 2.4* 1.3* 0.8 1.3*  AST 53* 101* 105* 134*  ALT 38 45* 60* 70*  ALKPHOS 121 96 163*  200*  PROT 5.9* 5.3* 5.0* 5.1*  ALBUMIN 2.6* 1.6* 1.4* 1.3*    Assessment and Plan:  Rt perinephric abscess drain intact OP great Will follow  Electronically Signed: Robet Leu, PA-C 10/15/2020, 9:50 AM   I spent a total of 15 Minutes at the the patient's bedside AND on the patient's hospital floor or unit, greater than 50% of which was counseling/coordinating care for right perinephric abscess drain

## 2020-10-15 NOTE — Progress Notes (Signed)
Pharmacy Antibiotic Note  Mark May is a 64 y.o. male admitted on 10/11/2020 with CVA. Pharmacy has been consulted to dose Vancomycin + Zosyn for R-perinephric abscess, cellulitis, and concern for aspiration PNA.  AKI - resolving with SCr down to 1.4. Abscess drain placed by IR 11/14 and cultures sent - will monitor these. Will adjust Vanc dose today to match improvement in renal function.   Plan: - Continue Zosyn 3.375g IV every 8 hours (infused over 4 hours) - Adjust Vancomycin to 500 mg IV every 12 hours - Will continue to follow renal function, culture results, LOT, and antibiotic de-escalation plans   Weight: 65 kg (143 lb 4.8 oz)  Temp (24hrs), Avg:98.8 F (37.1 C), Min:98 F (36.7 C), Max:99.3 F (37.4 C)  Recent Labs  Lab 10/11/20 1706 10/11/20 1906 10/12/20 0314 10/12/20 0453 10/13/20 1009 10/14/20 0116 10/15/20 0408  WBC 5.4  --  5.8  --  9.4 9.7 11.5*  CREATININE 1.86*  --  1.61*  --  1.40* 1.35* 1.40*  LATICACIDVEN  --  3.0*  --  1.6 1.8  --   --     Estimated Creatinine Clearance: 48.4 mL/min (A) (by C-G formula based on SCr of 1.4 mg/dL (H)).    No Known Allergies  Antimicrobials this admission: Cefepime 11/11 x 1 Flagyl 11/11 x 1 Zosyn 11/11>> Vanco 11/11>>11/11, 11/13 >>  Dose adjustments this admission:  Microbiology results: 11/11 BCx x2:  ngtd 11/10 UCx;  negative 11/13 BCx >> ngtd 11/14 R-retroperitoneal abscess >> mod GPC in pairs on GS >>  Thank you for allowing pharmacy to be a part of this patient's care.  Georgina Pillion, PharmD, BCPS Clinical Pharmacist Clinical phone for 10/15/2020: [phone] 10/15/2020 9:36 AM   **Pharmacist phone directory can now be found on [email or web address] ([email or web address]).  Listed under [facility].

## 2020-10-15 NOTE — Progress Notes (Signed)
Inpatient Rehabilitation-Admissions Coordinator   Still waiting on an insurance determination for CIR. Will continue to follow.   Cheri Rous, OTR/L  Rehab Admissions Coordinator  [phone] 10/15/2020 11:11 AM

## 2020-10-15 NOTE — Plan of Care (Signed)
Pt again had large tarry stool. Discussed with pharmacy, will hold off heparin IV. Will check H&H, continue PPI and TF for now.  Marvel Plan, MD PhD Stroke Neurology 10/15/2020 6:05 PM

## 2020-10-15 NOTE — Progress Notes (Signed)
Occupational Therapy Treatment Patient Details Name: Mark May MRN: [id number] DOB: 01/24/1956 Today's Date: 10/15/2020    History of present illness  64 y.o. male admitted with L hemianopsia, L facial droop and L side weakness s/p R MCA revacularization on 11/5 with post procedure intubation   OT comments  Pt seen in conjunction with PT to maximize pts activity tolerance and progress pt towards functional mobility goals. Utilized [employer] interpreter Amy [phone] during session. Pt continues to present with L sided weakness, decreased activity tolerance and generalized weakness impacting pts ability to complete BADLS. Pt declined getting to recliner d/t fatigue therefore focused session on mobility progression through functional transfer training and sitting balance. Pt was able to complete x2 sit<>stands with total A +2. Pt limited by fatigue this session and does report dizziness from EOB, however BP 146/79. Pt doffed O2 with O2 desatuating to 86% but was able to rebound to > 90% when reapplied at 4L. Pt sat EOB ~ 10 mins with up to MOD A for sitting balance. Pt would continue to benefit from skilled occupational therapy while admitted and after d/c to address the below listed limitations in order to improve overall functional mobility and facilitate independence with BADL participation. DC plan remains appropriate, will follow acutely per POC.    Follow Up Recommendations  CIR;Supervision/Assistance - 24 hour    Equipment Recommendations  Wheelchair (measurements OT);Wheelchair cushion (measurements OT);Hospital bed;3 in 1 bedside commode    Recommendations for Other Services      Precautions / Restrictions Precautions Precautions: Fall Precaution Comments: L side hemiparesis; monitor BP; watch O2 Restrictions Weight Bearing Restrictions: No       Mobility Bed Mobility Overal bed mobility: Needs Assistance Bed Mobility: Supine to Sit     Supine to sit: +2 for physical  assistance;Max assist     General bed mobility comments: pt was able to use RUE to roll to pts L side with increased time and effort and max multimodal cues but required MAX A +2 to fully transition from supine>EOB. pt needed assist to manage BLEs during all aspects of bed mobility  Transfers Overall transfer level: Needs assistance Equipment used: 2 person hand held assist Transfers: Sit to/from Stand Sit to Stand: Total assist;+2 physical assistance         General transfer comment: pt was able to complete x2 sit<>stands from EOB with total A +2. pt unable to pivot to recliner this session secondary to reports of fatigue and dizziness although BP WFL.    Balance Overall balance assessment: Needs assistance Sitting-balance support: Bilateral upper extremity supported;Feet supported Sitting balance-Leahy Scale: Poor Sitting balance - Comments: pt required up to MOD A to maintain static sitting balance EOB     Standing balance-Leahy Scale: Zero                             ADL either performed or assessed with clinical judgement   ADL Overall ADL's : Needs assistance/impaired                         Toilet Transfer: Total assistance;+2 for physical assistance Toilet Transfer Details (indicate cue type and reason): simulated via functional mobility although pt unable to pivot to recliner despite total A +2         Functional mobility during ADLs: Total assistance;+2 for physical assistance General ADL Comments: pt able to progress functional mobility with pt  able to complete x2 sit<>stands from EOB with total A +2. pt limited by L sided weakness, decreased activity tolerance and generalized weakness     Vision   Vision Assessment?: Vision impaired- to be further tested in functional context Additional Comments: pt continues to present with R gaze preference, pt was able to locate L hand while seated EOB with increased time and MAX multimodal cues    Perception     Praxis      Cognition Arousal/Alertness: Awake/alert Behavior During Therapy: Flat affect Overall Cognitive Status: Difficult to assess  Difficult to assess cognition secondary to chaotic environment from wife, RN and therapists being present as well as language barrier. "[place]" dialect not available therefore utilized "Insurance underwriter Comments pt doffed O2 with O2 desatuating to 86% but was able to rebound to > 90% when reapplied at 4L. pt reports dizziness once sitting EOB however BP 146/79. Pts wife present during session can be overly helpful. [employer] interpreter Amy [phone]     Pertinent Vitals/ Pain       Pain Assessment: No/denies pain  Home Living                                          Prior Functioning/Environment              Frequency  Min 2X/week        Progress Toward Goals  OT Goals(current goals can now be found in the care plan section)  Progress towards OT goals: Progressing toward goals  Acute Rehab OT Goals Patient Stated Goal: to improve OT Goal Formulation: Patient unable to participate in goal setting Time For Goal Achievement: 10/23/20 Potential to Achieve Goals: Good  Plan Discharge plan remains appropriate;Frequency remains appropriate    Co-evaluation    PT/OT/SLP Co-Evaluation/Treatment: Yes Reason for Co-Treatment: Complexity of the patient's impairments (multi-system involvement);For patient/therapist safety;To address functional/ADL transfers   OT goals addressed during session: ADL's and self-care      AM-PAC OT "6 Clicks" Daily Activity     Outcome Measure   Help from another person eating meals?: Total (NPO) Help from another person taking care of personal grooming?: A Lot Help from another person toileting, which includes using toliet, bedpan, or urinal?: Total Help  from another person bathing (including washing, rinsing, drying)?: Total Help from another person to put on and taking off regular upper body clothing?: Total Help from another person to put on and taking off regular lower body clothing?: Total 6 Click Score: 7    End of Session Equipment Utilized During Treatment: Gait belt  OT Visit Diagnosis: Unsteadiness on feet (R26.81);Muscle weakness (generalized) (M62.81);Other symptoms and signs involving cognitive function;Hemiplegia and hemiparesis Hemiplegia - Right/Left: Left Hemiplegia - caused by: Cerebral infarction   Activity Tolerance Patient limited by lethargy;Patient limited by fatigue   Patient Left in bed;with call bell/phone within reach;with nursing/sitter in room   Nurse Communication Mobility status        Time: 0962-8366 OT Time Calculation (min): 35 min  Charges: OT General Charges $OT Visit: 1 Visit OT  Treatments $Therapeutic Activity: 8-22 mins  Audery Amel., COTA/L Acute Rehabilitation Services [phone]    Angelina Pih 10/15/2020, 10:15 AM

## 2020-10-15 NOTE — Progress Notes (Addendum)
[facility]  PROGRESS NOTE  Mark May YFV:[id number] DOB: 09-25-1956 DOA: 10/30/2020 PCP: Reubin Milan, MD   Brief HPI:   64 year old male with medical history of hypertension, hyperlipidemia, stomach ulcer surgery who presented to [facility] on 10/15/2020 with left-sided weakness, noted to have right MCA occlusion s/p thrombectomy and stent placement on 10/20/2020.  Patient hospitalization was complicated by hypoxic respiratory failure due to pulmonary edema requiring BiPAP and Lasix.  He was awaiting ECF placement however on 10/10/2020 he became more lethargic and tachycardic with tachypnea.  Patient went into A. fib with RVR ,also had a fever.  Due to concern for severe sepsis, PCCM was consulted.  Patient found to have sepsis with right lower lobe pneumonia and likely aspiration.  Started on IV Zosyn.  Also started on amiodarone and heparin gtt.    Subjective   Patient seen and examined, had melanotic stools overnight.  Stool guaiac was positive.  Hemoglobin is stable   Assessment/Plan:     1. Sepsis due to pneumonia-?  Aspiration.  Patient started on IV Zosyn, vancomycin.   2. Right perinephric abscess-patient had tenderness in right lower quadrant with guarding, erythematous rash noted in right lower quadrant.  CT abdomen/pelvis showed right perinephric abscess.  S/p drain placement per IR yesterday.  Continue IV antibiotics as above.  Culture from abscess growing gram-positive cocci in chains and pairs, gram-negative rods.  Will follow culture results. 3. Dysphagia-patient is high risk for aspiration, is unable to consume adequate calories.  Speech therapist discussed with patient and his wife, he agrees for core track feeding tube placement for short duration.  Core track feeding tube is in place will order core track feeding tube.  Patient receiving tube feedings. 4. Paroxysmal atrial fibrillation with RVR-heart rate is controlled, continue heparin GTT, amiodarone  infusion. 5. 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  Continue ticagrelor 6. Transaminitis-patient has mild elevated liver enzymes, likely in setting of sepsis.  Liver function is slowly improving. 7. Melena-patient has past medical history of peptic ulcer s/p partial gastrectomy in [place].  Stool guaiac was positive.  Will consult GI.  Abdominal x-ray obtained today shows no free air. 8. Hypertension-Lopressor on hold due to hypotension 9. Acute kidney injury-patient baseline creatinine is around 0.8-1, creatinine has been steadily rising.  Today creatinine is 1.40 ,likely from prerenal azotemia.  Continue LR at 100 mill per hour.  Follow BMP in am. 10. Hypokalemia-potassium is 3.3, will replace potassium and follow BMP in am. 11. Diabetes mellitus type 2-CBG well controlled, continue sliding scale insulin NovoLog.       Lab Results  Component Value Date   SARSCOV2NAA NEGATIVE 10/21/2020     Scheduled medications:   .  stroke: mapping our early stages of recovery book   Does not apply Once  . [START ON 10/20/2020] amiodarone  200 mg Per Tube Daily   Followed by  . amiodarone  200 mg Per Tube BID  . chlorhexidine  15 mL Mouth Rinse BID  . Chlorhexidine Gluconate Cloth  6 each Topical Daily  . free water  200 mL Per Tube Q4H  . insulin aspart  0-9 Units Subcutaneous Q4H  . mouth rinse  15 mL Mouth Rinse q12n4p  . pantoprazole (PROTONIX) IV  40 mg Intravenous Q12H  . sodium chloride flush  5 mL Intracatheter Q8H  . ticagrelor  90 mg Oral BID   Or  . ticagrelor  90 mg Per Tube BID         CBG: Recent Labs  Lab 10/14/20 2013 10/14/20 2314 10/15/20 0359 10/15/20 1016 10/15/20 1226  GLUCAP 118* 141* 139* 144* 116*    SpO2: 97 % O2 Flow Rate (L/min): 4 L/min FiO2 (%): 50 %    CBC: Recent Labs  Lab 10/11/20 1706 10/12/20 0314 10/13/20 1009 10/14/20 0116 10/15/20 0408  WBC  5.4 5.8 9.4 9.7 11.5*  NEUTROABS 4.8  --   --   --   --   HGB 12.4* 11.7* 11.4* 10.7* 10.4*  HCT 36.7* 34.3* 32.4* 30.1* 29.6*  MCV 87.0 86.0 84.8 82.7 82.2  PLT 218 202 222 197 254    Basic Metabolic Panel: Recent Labs  Lab 10/11/20 1706 10/11/20 1746 10/12/20 0314 10/12/20 1814 10/13/20 1009 10/13/20 1658 10/14/20 0116 10/15/20 0408  NA 140  --  143  --  143  --  145 149*  K 3.6  --  3.8  --  3.1*  --  3.5 3.3*  CL 110  --  113*  --  111  --  113* 116*  CO2 18*  --  19*  --  20*  --  22 20*  GLUCOSE 155*  --  132*  --  194*  --  151* 148*  BUN 47*  --  42*  --  43*  --  42* 45*  CREATININE 1.86*  --  1.61*  --  1.40*  --  1.35* 1.40*  CALCIUM 8.1*  --  8.0*  --  7.5*  --  7.5* 7.5*  MG  --  2.3  --  2.2 2.1 2.1 2.2  --   PHOS 1.6*  --   --  2.9 2.6 2.6 2.8  --      Liver Function Tests: Recent Labs  Lab 10/11/20 1706 10/13/20 1009 10/15/20 0408  AST 101* 105* 134*  ALT 45* 60* 70*  ALKPHOS 96 163* 200*  BILITOT 1.3* 0.8 1.3*  PROT 5.3* 5.0* 5.1*  ALBUMIN 1.6* 1.4* 1.3*     Antibiotics: Anti-infectives (From admission, onward)   Start     Dose/Rate Route Frequency Ordered Stop   10/15/20 1630  vancomycin (VANCOREADY) IVPB 500 mg/100 mL        500 mg 100 mL/hr over 60 Minutes Intravenous Every 12 hours 10/15/20 0940     10/14/20 0400  vancomycin (VANCOREADY) IVPB 500 mg/100 mL  Status:  Discontinued        500 mg 100 mL/hr over 60 Minutes Intravenous Every 24 hours 10/13/20 0233 10/15/20 0940   10/13/20 0330  vancomycin (VANCOREADY) IVPB 750 mg/150 mL        750 mg 150 mL/hr over 60 Minutes Intravenous  Once 10/13/20 0233 10/13/20 0439   10/12/20 1800  ceFEPIme (MAXIPIME) 2 g in sodium chloride 0.9 % 100 mL IVPB  Status:  Discontinued        2 g 200 mL/hr over 30 Minutes Intravenous Every 24 hours 10/11/20 1718 10/11/20 1742   10/12/20 1800  vancomycin (VANCOREADY) IVPB 750 mg/150 mL  Status:  Discontinued        750 mg 150 mL/hr over 60 Minutes  Intravenous Every 24 hours 10/11/20 1724 10/11/20 1758   10/12/20 0200  metroNIDAZOLE (FLAGYL) tablet 500 mg  Status:  Discontinued        500 mg Oral Every 8 hours 10/11/20 1715 10/11/20 1758   10/12/20 0030  piperacillin-tazobactam (ZOSYN) IVPB 3.375 g  3.375 g 12.5 mL/hr over 240 Minutes Intravenous Every 8 hours 10/11/20 1805     10/11/20 1830  piperacillin-tazobactam (ZOSYN) IVPB 3.375 g        3.375 g 100 mL/hr over 30 Minutes Intravenous  Once 10/11/20 1805 10/11/20 1839   10/11/20 1715  ceFEPIme (MAXIPIME) 2 g in sodium chloride 0.9 % 100 mL IVPB  Status:  Discontinued        2 g 200 mL/hr over 30 Minutes Intravenous STAT 10/11/20 1710 10/11/20 1742   10/11/20 1715  metroNIDAZOLE (FLAGYL) IVPB 500 mg  Status:  Discontinued        500 mg 100 mL/hr over 60 Minutes Intravenous STAT 10/11/20 1710 10/11/20 1807   10/11/20 1715  vancomycin (VANCOCIN) IVPB 1000 mg/200 mL premix  Status:  Discontinued        1,000 mg 200 mL/hr over 60 Minutes Intravenous STAT 10/11/20 1710 10/11/20 1804   10/25/2020 1541  ceFAZolin (ANCEF) 2-4 GM/100ML-% IVPB       Note to Pharmacy: Teofilo PodJohnson, Ericka   : cabinet override      10/28/2020 1541 10/06/20 0344       DVT prophylaxis: Heparin     Objective   Vitals:   10/15/20 0500 10/15/20 0600 10/15/20 0903 10/15/20 1227  BP:   (!) 152/80 (!) 154/79  Pulse:   80 84  Resp: (!) 25 (!) 23 (!) 24 (!) 24  Temp:   98.6 F (37 C) 98.1 F (36.7 C)  TempSrc:   Oral Oral  SpO2:   97%   Weight:        Intake/Output Summary (Last 24 hours) at 10/15/2020 1459 Last data filed at 10/15/2020 1332 Gross per 24 hour  Intake 1145.72 ml  Output 1965 ml  Net -819.28 ml    11/13 1901 - 11/15 0700 In: 1296.5 [I.V.:127.9] Out: 4165 [Urine:3300; Drains:865]  Filed Weights   10/12/20 0500 10/13/20 0414  Weight: 56.5 kg 65 kg    Physical Examination:  General-appears in no acute distress Heart-S1-S2, regular, no murmur auscultated Lungs-clear to  auscultation bilaterally, no wheezing or crackles auscultated Abdomen-soft, nontender, no organomegaly Extremities-no edema in the lower extremities Neuro-alert, oriented x3, no focal deficit noted       Data Reviewed:   Recent Results (from the past 240 hour(s))  MRSA PCR Screening     Status: None   Collection Time: 10/28/2020  6:34 PM   Specimen: Nasal Mucosa; Nasopharyngeal  Result Value Ref Range Status   MRSA by PCR NEGATIVE NEGATIVE Final    Comment:        The GeneXpert MRSA Assay (FDA approved for NASAL specimens only), is one component of a comprehensive MRSA colonization surveillance program. It is not intended to diagnose MRSA infection nor to guide or monitor treatment for MRSA infections. Performed at [facility], [address] [place], [place] [address]   Culture, Urine     Status: None   Collection Time: 10/10/20  5:13 PM   Specimen: Urine, Catheterized  Result Value Ref Range Status   Specimen Description URINE, CATHETERIZED  Final   Special Requests NONE  Final   Culture   Final    NO GROWTH Performed at [facility], [address] [place], [place] [address]    Report Status 10/11/2020 FINAL  Final  Culture, blood (x 2)     Status: None (Preliminary result)   Collection Time: 10/11/20  7:13 PM   Specimen: BLOOD  Result Value  Ref Range Status   Specimen Description BLOOD SITE NOT SPECIFIED  Final   Special Requests   Final    BOTTLES DRAWN AEROBIC AND ANAEROBIC Blood Culture results may not be optimal due to an inadequate volume of blood received in culture bottles   Culture   Final    NO GROWTH 4 DAYS Performed at [facility], [address] [place], [place] [address]    Report Status PENDING  Incomplete  Culture, blood (x 2)     Status: None (Preliminary result)   Collection Time: 10/11/20  7:15 PM   Specimen: BLOOD  Result Value Ref Range Status   Specimen Description BLOOD SITE NOT SPECIFIED  Final   Special Requests    Final    BOTTLES DRAWN AEROBIC AND ANAEROBIC Blood Culture adequate volume   Culture   Final    NO GROWTH 4 DAYS Performed at [facility], [address] [place], [place] [address]    Report Status PENDING  Incomplete  Culture, blood (routine x 2)     Status: None (Preliminary result)   Collection Time: 10/13/20  3:01 AM   Specimen: BLOOD LEFT HAND  Result Value Ref Range Status   Specimen Description BLOOD LEFT HAND  Final   Special Requests   Final    AEROBIC BOTTLE ONLY Blood Culture results may not be optimal due to an inadequate volume of blood received in culture bottles   Culture   Final    NO GROWTH 2 DAYS Performed at [facility], [address] [place], [place] [address]    Report Status PENDING  Incomplete  Culture, blood (routine x 2)     Status: None (Preliminary result)   Collection Time: 10/13/20 10:10 AM   Specimen: BLOOD RIGHT ARM  Result Value Ref Range Status   Specimen Description BLOOD RIGHT ARM  Final   Special Requests   Final    BOTTLES DRAWN AEROBIC AND ANAEROBIC Blood Culture adequate volume   Culture   Final    NO GROWTH 2 DAYS Performed at [facility], [address] [place], [place] [address]    Report Status PENDING  Incomplete  Aerobic/Anaerobic Culture (surgical/deep wound)     Status: None (Preliminary result)   Collection Time: 10/14/20 12:08 PM   Specimen: Abscess  Result Value Ref Range Status   Specimen Description ABSCESS  Final   Special Requests RIGHT RETROPERITONEAL ABSCESS  Final   Gram Stain   Final    RARE WBC PRESENT, PREDOMINANTLY PMN MODERATE GRAM POSITIVE COCCI IN CHAINS IN PAIRS RARE GRAM POSITIVE RODS Performed at [facility], [address] [place], [place] [address]    Culture MODERATE GRAM NEGATIVE RODS MODERATE YEAST   Final   Report Status PENDING  Incomplete    No results for input(s): LIPASE, AMYLASE in the last 168 hours. Recent Labs  Lab 10/15/20 0408  AMMONIA 32     Cardiac Enzymes: Recent Labs  Lab 10/13/20 1009 10/15/20 0408  CKTOTAL 991* 630*   BNP (last 3 results) No results for input(s): BNP in the last 8760 hours.  ProBNP (last 3 results) No results for input(s): PROBNP in the last 8760 hours.  Studies:  CT ABDOMEN PELVIS WO CONTRAST  Result Date: 10/13/2020 CLINICAL DATA:  63 year old male with abdominal abscess or infection. EXAM: CT ABDOMEN AND PELVIS WITHOUT CONTRAST TECHNIQUE: Multidetector CT imaging of the abdomen and pelvis was performed following the standard protocol  without IV contrast. COMPARISON:  Renal ultrasound dated 10/11/2020. FINDINGS: Evaluation of this exam is limited in the absence of intravenous contrast. Lower chest: Partially visualized small bilateral pleural effusions with complete consolidative changes of the visualized lower lobes. No intra-abdominal free air. There is a small free fluid in the pelvis. Hepatobiliary: The liver is unremarkable. No intrahepatic biliary dilatation. There are multiple stones in the gallbladder. No pericholecystic fluid or evidence of acute cholecystitis by CT. Pancreas: Unremarkable. No pancreatic ductal dilatation or surrounding inflammatory changes. Spleen: Normal in size without focal abnormality. Adrenals/Urinary Tract: The adrenal glands unremarkable. Multiple nonobstructing right renal calculi measure up to 4 mm in the inferior pole of the right kidney. No hydronephrosis. There is no hydronephrosis or nephrolithiasis on the left. Multiple bilateral renal hypodense lesions are not characterized on this noncontrast CT. There is fluid collection with scattered pockets of air in the right perinephric space and along the right psoas muscle extending down into the pelvis. The fluid collection measures 7.4 x 4.3 cm in greatest axial dimensions and 17 cm in craniocaudal length. There is air within the urinary bladder, possibly related to recent instrumentation. An infectious process is not  excluded. Stomach/Bowel: A feeding tube is noted with tip appearing in the distal stomach. Evaluation however is limited due to respiratory motion. Contrast is noted in the colon. There is distal colonic diverticulosis without active inflammatory changes. No bowel obstruction. The appendix is not visualized with certainty. Vascular/Lymphatic: Moderate aortoiliac atherosclerotic disease. The IVC is unremarkable. No portal venous gas. There is no adenopathy. Reproductive: The prostate and seminal vesicles are grossly unremarkable. Other: There is mild diffuse subcutaneous edema. No fluid collection. Musculoskeletal: No acute or significant osseous findings. IMPRESSION: 1. Large right perinephric fluid collection and pockets of air concerning for an infectious process/abscess. Clinical correlation is recommended. 2. Cholelithiasis. 3. Nonobstructing right renal calculi. No hydronephrosis. 4. Colonic diverticulosis. No bowel obstruction. 5. Partially visualized small bilateral pleural effusions with complete consolidative changes of the visualized lower lobes. 6. Aortic Atherosclerosis (ICD10-I70.0). Electronically Signed   By: Elgie Collard M.D.   On: 10/13/2020 16:42   DG Abd 1 View  Result Date: 10/15/2020 CLINICAL DATA:  Abdominal pain EXAM: ABDOMEN - 1 VIEW COMPARISON:  Abdomen radiograph October 13, 2020 and CT abdomen and pelvis October 13, 2020 FINDINGS: There is a drain in the right lower quadrant. Feeding tube tip is in the distal stomach region. There are foci of contrast in colon and appendix. There is no bowel dilatation or air-fluid level to suggest bowel obstruction. No free air is appreciable on supine examination. IMPRESSION: No bowel obstruction. No free air evident on supine examination. Tube and catheter positions as noted. Electronically Signed   By: Bretta Bang III M.D.   On: 10/15/2020 13:07   CT IMAGE GUIDED DRAINAGE BY PERCUTANEOUS CATHETER  Result Date: 10/14/2020 CLINICAL  DATA:  Large retroperitoneal abscess centered primarily inferior to the right kidney. EXAM: CT GUIDED CATHETER DRAINAGE OF RIGHT RETROPERITONEAL ABSCESS ANESTHESIA/SEDATION: 2.0 mg IV Versed 100 mcg IV Fentanyl Total Moderate Sedation Time:  12 minutes The patient's level of consciousness and physiologic status were continuously monitored during the procedure by Radiology nursing. PROCEDURE: The procedure, risks, benefits, and alternatives were explained to the patient. Questions regarding the procedure were encouraged and answered. The patient understands and consents to the procedure. A foreign language interpreter was utilized for consent. A time out was performed prior to initiating the procedure. CT was performed through the abdomen in a  prone position. The right translumbar region was prepped with chlorhexidine in a sterile fashion, and a sterile drape was applied covering the operative field. A sterile gown and sterile gloves were used for the procedure. Local anesthesia was provided with 1% Lidocaine. Under CT guidance, an 18 gauge trocar needle was advanced into the right retroperitoneal space. After return of fluid, a guidewire was advanced. The tract was dilated and a 12 French percutaneous drainage catheter placed. Additional fluid sample was aspirated from the drain and sent for culture analysis. Catheter position was confirmed by CT. The catheter was attached to suction bulb drainage. It was secured at the skin with a Prolene retention suture and StatLock device. COMPLICATIONS: None FINDINGS: The retroperitoneal collection yielded reddish brown fluid. After drain placement there is copious return of fluid. IMPRESSION: CT-guided percutaneous catheter drainage of right retroperitoneal abscess yielding reddish brown fluid. A fluid sample was sent for culture analysis. A 12 French drainage catheter was placed and attached to suction bulb drainage. Electronically Signed   By: Irish Lack M.D.   On:  10/14/2020 12:47       Mckenzi Buonomo S Shyleigh Daughtry   [facility] If 7PM-7AM, please contact night-coverage at [email or web address], Office  [phone]   10/15/2020, 2:59 PM  LOS: 10 days

## 2020-10-15 NOTE — Progress Notes (Signed)
STROKE TEAM PROGRESS NOTE   INTERVAL HISTORY Wife and son are at bedside. Pt more awake alert and fully orientated. Abdomen much softer, denies abdominal pain. No N/V. Drainage continue to drain, no sign of decrease. Had dark stool this am, occult blood positive. H&H still stable. GI on board, put on IV PPI. No EGD needed. Will start TF.   OBJECTIVE Vitals:   10/15/20 0500 10/15/20 0600 10/15/20 0903 10/15/20 1227  BP:   (!) 152/80 (!) 154/79  Pulse:   80 84  Resp: (!) 25 (!) 23 (!) 24 (!) 24  Temp:   98.6 F (37 C) 98.1 F (36.7 C)  TempSrc:   Oral Oral  SpO2:   97%   Weight:       CBC:  Recent Labs  Lab 10/11/20 1706 10/12/20 0314 10/14/20 0116 10/15/20 0408  WBC 5.4   < > 9.7 11.5*  NEUTROABS 4.8  --   --   --   HGB 12.4*   < > 10.7* 10.4*  HCT 36.7*   < > 30.1* 29.6*  MCV 87.0   < > 82.7 82.2  PLT 218   < > 197 254   < > = values in this interval not displayed.   Basic Metabolic Panel:  Recent Labs  Lab 10/13/20 1009 10/13/20 1658 10/14/20 0116 10/15/20 0408  NA   < >  --  145 149*  K   < >  --  3.5 3.3*  CL   < >  --  113* 116*  CO2   < >  --  22 20*  GLUCOSE   < >  --  151* 148*  BUN   < >  --  42* 45*  CREATININE   < >  --  1.35* 1.40*  CALCIUM   < >  --  7.5* 7.5*  MG  --  2.1 2.2  --   PHOS  --  2.6 2.8  --    < > = values in this interval not displayed.   Lipid Panel:     Component Value Date/Time   CHOL 173 10/06/2020 0522   CHOL 216 (H) 03/09/2018 1620   TRIG 396 (H) 10/07/2020 0527   HDL 33 (L) 10/06/2020 0522   HDL 59 03/09/2018 1620   CHOLHDL 5.2 10/06/2020 0522   VLDL 55 (H) 10/06/2020 0522   LDLCALC 85 10/06/2020 0522   LDLCALC 136 (H) 03/09/2018 1620   HgbA1c:  Lab Results  Component Value Date   HGBA1C 5.8 (H) 10/06/2020   Urine Drug Screen: No results found for: LABOPIA, COCAINSCRNUR, LABBENZ, AMPHETMU, THCU, LABBARB  Alcohol Level No results found for: [facility]  IMAGING  CT HEAD WO CONTRAST 2020-10-07 1. No acute abnormality.   2. Minimal diffuse cerebral and cerebellar atrophy.  3. Mild chronic small vessel white matter ischemic changes in both cerebral hemispheres.   CT Angio Head W or Wo Contrast CT Angio Neck W and/or Wo Contrast CT CEREBRAL PERFUSION W CONTRAST 10-07-20 1. 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  2. Right MCA infarct with extensive penumbra.  3. 4 mm right supraclinoid ICA aneurysm.  4. Widely patent cervical carotid and vertebral arteries.  5. Aortic Atherosclerosis (ICD10-I70.0).   CT HEAD WO CONTRAST 2020/10/07 Hypodensity in the right lateral temporal lobe now shows mild hemorrhage or contrast enhancement. This is most consistent with acute infarct. There has been interval stenting of the right middle cerebral artery.   CT HEAD WO CONTRAST  10/20/2020 Right temporal lobe hypodensity compatible with acute infarct. Decreased hyperdensity within this stroke compared with 4 hours previously compatible with contrast staining rather than hemorrhage. Small area of acute infarct in the right parietal lobe now evident. Right MCA stent.   MRI brain: 10/06/20 1. Fairly extensive patchy acute/early subacute infarcts within the right MCA/watershed territory.  2. Additional small acute/early subacute infarcts within the dorsal right thalamus and left basal ganglia. 3. Background moderate chronic small vessel ischemic disease.  MRA head: 10/06/20 1. Interval stenting of the M1/M2 right middle cerebral artery. Flow related signal is present proximal and distal to the stent suggestive of stent patency. 2. 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  DG Abd 1 View 10/13/2020 Gastric catheter within the stomach kinked at the proximal side port. This could be withdrawn slightly S necessary.   Transthoracic Echocardiogram  10/06/2020 1. Left ventricular ejection fraction, by estimation, is 70 to 75%. The left ventricle has hyperdynamic function.  The left ventricle has no regional wall motion abnormalities. Left ventricular diastolic parameters are consistent with Grade II diastolic dysfunction (pseudonormalization). Elevated left atrial pressure.  2. Right ventricular systolic function is normal. The right ventricular size is normal.  3. The mitral valve is normal in structure. Mild mitral valve regurgitation. No evidence of mitral stenosis.  4. The aortic valve is normal in structure. Aortic valve regurgitation is not visualized. No aortic stenosis is present.  5. The inferior vena cava is dilated in size with <50% respiratory variability, suggesting right atrial pressure of 15 mmHg.  ECG - SR rate 70 BPM. (See cardiology reading for complete details)  DG Abd 1 View  Result Date: 10/15/2020 CLINICAL DATA:  Abdominal pain EXAM: ABDOMEN - 1 VIEW COMPARISON:  Abdomen radiograph October 13, 2020 and CT abdomen and pelvis October 13, 2020 FINDINGS: There is a drain in the right lower quadrant. Feeding tube tip is in the distal stomach region. There are foci of contrast in colon and appendix. There is no bowel dilatation or air-fluid level to suggest bowel obstruction. No free air is appreciable on supine examination. IMPRESSION: No bowel obstruction. No free air evident on supine examination. Tube and catheter positions as noted. Electronically Signed   By: Bretta Bang III M.D.   On: 10/15/2020 13:07    PHYSICAL EXAM    Temp:  [98.1 F (36.7 C)-99.3 F (37.4 C)] 98.1 F (36.7 C) (11/15 1227) Pulse Rate:  [80-103] 84 (11/15 1227) Resp:  [14-25] 24 (11/15 1227) BP: (139-154)/(73-93) 154/79 (11/15 1227) SpO2:  [93 %-97 %] 97 % (11/15 0903)  General - Well nourished, well developed, mild restless.  Ophthalmologic - fundi not visualized due to noncooperation.  Cardiovascular - Regular rhythm and rate.  Abdominal - soft, non tender, drainage tube at right side in place.   Neuro -  Awake, alert, fully orientated to self,  age, time and people and place. Moderate dysarthria, no aphasia but paucity of speech, able to name and repeat, follows simple commands. Tracking bilaterally, seems to have intact visual field. PERRL, no gaze deviation or palsy. Left facial droop. Tongue midline. Left UE flaccid. Left LE slight withdraw to pain. RUE at least 4/5 and RLE at least 3/5. Sensation symmetrical, coordination intact on the right and gait not tested.   ASSESSMENT/PLAN Mark May is a 64 y.o. male with history of hypertension, BPH, sciatica, stomach ulcer status post surgery in the past presented to ER at [facility]  for acute onset left-sided weakness, left facial droop and left hemianopia.  CT head and neck showed right M1 high-grade stenosis versus near occlusive thrombosis.  CT perfusion showed large penumbra.  Patient transferred to [facility] for thrombectomy.  He did not receive IV t-PA due to late presentation (>4.5 hours from time of onset). IR - complete occlusion of RT MCA M 1 seg - thrombectomy ->TICI 3 revascularization with placement of rescue stent m4 mm x 24 mm Atlas for reocclusion with restoration of TICI 3 .  Stroke: R MCA infarct s/p IR TICI3 R M1 w/ stent placement - likely embolic due to new diagnosed afib  CT Head - No acute abnormality. Minimal diffuse cerebral and cerebellar atrophy.    CTA H&N - Severe distal right M1 stenosis or subocclusive thrombus. 4 mm right supraclinoid ICA aneurysm. Widely patent cervical carotid and vertebral arteries.   CTP - Right MCA infarct with extensive penumbra.   MRI head - Fairly extensive patchy acute/early subacute infarcts within the right MCA/watershed territory. Additional small acute/early subacute infarcts within the dorsal right thalamus and left basal ganglia.  MRA head -  Interval stenting of the M1/M2 right middle cerebral artery. Flow related signal is present proximal and distal to the stent suggestive of stent patency. Redemonstrated  4 mm saccular aneurysm arising from the supraclinoid right ICA.  2D Echo - EF 70 - 75%. No cardiac source of emboli identified.   UE doppler neg DVT  Loyal Jacobson Virus 2 - negative  LDL - 85  HgbA1c - 5.8  VTE prophylaxis - SCDs  No antithrombotic prior to admission, now on Brilinta (ticagrelor) 90 mg bid and heparin IV.     Therapy recommendations:  CIR  Disposition:  Pending  Atrial Fibrillation w/ RVR, new diagnosis 11/11  CHA2DS2-VASc Score = at least 5,  ?2 oral anticoagulation recommended  Age in Years:  19-74   +1    Sex:  Male   0    Hypertension History:  yes   +1     Diabetes Mellitus:  0  Congestive Heart Failure History:  yes   +1   Vascular Disease History:  0     Stroke/TIA/Thromboembolism History:  yes   +2 . Off amio gtt -> amio po now . On IV heparin re-started 11/14   Sepsis with fever Retroperitoneal abscess RLL PNA, likely aspiration  CXR 11/13 persistent retrocardiac left base collapse/consolidation and diffuse bilateral airspace disease  Tmax 102.6->101.2-> afebrile  WBC 5.8->9.4-> 9.7->11.5  On Zosyn (started 11/12) and vanco (dose modified 11/15)  Blood Cx NGTD  CT abdomen and pelvis right retroperitoneal abscess  S/p abscess drainage  Source of retroperitoneal abscess unclear - GU vs. GI - urology and GI on board  AUB no free air  CCM on board   AKI  Cre 1.48-1.69-1.96->1.61->1.40->1.35->1.40  Stopped lisinopril   On FW 200 Q4h  Will start TF  Avoid nephrotoxins  UGIB Blood loss anemia  Hb 12.4->10.7->10.4   Stool occult blood positive  GI on board - signed off  Recommend PPI IV bid -> at discharge PPI po Bid for one month and then qd  Hypertension Tachycardia  Home BP meds: Amlodipine  off Cleviprex  Stable . BP goal <180 . Stopped lisinopril d/t rising Cre . Long-term BP goal normotensive  Hyperlipidemia  Home Lipid lowering medication: none  LDL 85, goal < 70  Off lipitor now due to elevated  CK level and increasing LFTs  CK 991->630  ALT/AST - 101/45->105/60->134/70  Dysphagia . Secondary to stroke . Resume FW 11/14 . OK to continue TF per GI 11/15 . Speech on board   Other Stroke Risk Factors  Advanced age  Other Active Problems  Aortic Atherosclerosis (ICD10-I70.0).   4 mm right supraclinoid ICA aneurysm.  Hypokalemia - 3.1 ->3.5->3.3   Hx of PUD s/p surgery  Hospital day # 10   I have discussed with Dr. Sharl MaLama extensively about pt treatment plan. I spent  35 minutes in total face-to-face time with the patient, more than 50% of which was spent in counseling and coordination of care, reviewing test results, images and medication, and discussing the diagnosis, treatment plan and potential prognosis. This patient's care requiresreview of multiple databases, neurological assessment, discussion with family, other specialists and medical decision making of high complexity. I had long discussion with wife and son at bedside and son over the phone, updated pt current condition, treatment plan and potential prognosis, and answered all the questions. They expressed understanding and appreciation.   Marvel PlanJindong Hopie Pellegrin, MD PhD Stroke Neurology 10/15/2020 4:39 PM    to contact Stroke Continuity provider, please refer to [email or web address]. After hours, contact General Neurology

## 2020-10-15 NOTE — Progress Notes (Signed)
Consult to RT to come and look at the pt's throat. Charge RT came and recommended that we not tamper with the dried up blood secretions to the roof of pt's throat. Also, to call the MD and make them aware because it could block the pt's airway. MD Wilford Corner made aware and stated to monitor and watch O2 stats.

## 2020-10-16 ENCOUNTER — Inpatient Hospital Stay (HOSPITAL_COMMUNITY): Payer: 59

## 2020-10-16 DIAGNOSIS — I63131 Cerebral infarction due to embolism of right carotid artery: Secondary | ICD-10-CM | POA: Diagnosis not present

## 2020-10-16 DIAGNOSIS — R6520 Severe sepsis without septic shock: Secondary | ICD-10-CM

## 2020-10-16 DIAGNOSIS — I4891 Unspecified atrial fibrillation: Secondary | ICD-10-CM | POA: Diagnosis not present

## 2020-10-16 DIAGNOSIS — I6601 Occlusion and stenosis of right middle cerebral artery: Secondary | ICD-10-CM | POA: Diagnosis not present

## 2020-10-16 DIAGNOSIS — A419 Sepsis, unspecified organism: Secondary | ICD-10-CM

## 2020-10-16 DIAGNOSIS — R000 Tachycardia, unspecified: Secondary | ICD-10-CM

## 2020-10-16 DIAGNOSIS — R140 Abdominal distension (gaseous): Secondary | ICD-10-CM

## 2020-10-16 DIAGNOSIS — N151 Renal and perinephric abscess: Secondary | ICD-10-CM | POA: Diagnosis not present

## 2020-10-16 DIAGNOSIS — N179 Acute kidney failure, unspecified: Secondary | ICD-10-CM | POA: Diagnosis not present

## 2020-10-16 LAB — GLUCOSE, CAPILLARY
Glucose-Capillary: 115 mg/dL — ABNORMAL HIGH (ref 70–99)
Glucose-Capillary: 128 mg/dL — ABNORMAL HIGH (ref 70–99)
Glucose-Capillary: 131 mg/dL — ABNORMAL HIGH (ref 70–99)
Glucose-Capillary: 135 mg/dL — ABNORMAL HIGH (ref 70–99)
Glucose-Capillary: 147 mg/dL — ABNORMAL HIGH (ref 70–99)
Glucose-Capillary: 151 mg/dL — ABNORMAL HIGH (ref 70–99)
Glucose-Capillary: 197 mg/dL — ABNORMAL HIGH (ref 70–99)

## 2020-10-16 LAB — BASIC METABOLIC PANEL
Anion gap: 8 (ref 5–15)
BUN: 46 mg/dL — ABNORMAL HIGH (ref 8–23)
CO2: 20 mmol/L — ABNORMAL LOW (ref 22–32)
Calcium: 7.4 mg/dL — ABNORMAL LOW (ref 8.9–10.3)
Chloride: 124 mmol/L — ABNORMAL HIGH (ref 98–111)
Creatinine, Ser: 1.32 mg/dL — ABNORMAL HIGH (ref 0.61–1.24)
GFR, Estimated: 60 mL/min — ABNORMAL LOW (ref >60)
Glucose, Bld: 144 mg/dL — ABNORMAL HIGH (ref 70–99)
Potassium: 4.3 mmol/L (ref 3.5–5.1)
Sodium: 152 mmol/L — ABNORMAL HIGH (ref 135–145)

## 2020-10-16 LAB — PHOSPHORUS
Phosphorus: 2.9 mg/dL (ref 2.5–4.6)
Phosphorus: 3.9 mg/dL (ref 2.5–4.6)

## 2020-10-16 LAB — CBC
HCT: 29.2 % — ABNORMAL LOW (ref 39.0–52.0)
Hemoglobin: 10.4 g/dL — ABNORMAL LOW (ref 13.0–17.0)
MCH: 29.7 pg (ref 26.0–34.0)
MCHC: 35.6 g/dL (ref 30.0–36.0)
MCV: 83.4 fL (ref 80.0–100.0)
Platelets: 263 10*3/uL (ref 150–400)
RBC: 3.5 MIL/uL — ABNORMAL LOW (ref 4.22–5.81)
RDW: 14.5 % (ref 11.5–15.5)
WBC: 12.2 10*3/uL — ABNORMAL HIGH (ref 4.0–10.5)
nRBC: 0.2 % (ref 0.0–0.2)

## 2020-10-16 LAB — BLOOD GAS, ARTERIAL
Acid-base deficit: 2.4 mmol/L — ABNORMAL HIGH (ref 0.0–2.0)
Bicarbonate: 20.4 mmol/L (ref 20.0–28.0)
Drawn by: 60057
FIO2: 44
O2 Saturation: 95.1 %
Patient temperature: 37
pCO2 arterial: 26.7 mmHg — ABNORMAL LOW (ref 32.0–48.0)
pH, Arterial: 7.495 — ABNORMAL HIGH (ref 7.350–7.450)
pO2, Arterial: 76.5 mmHg — ABNORMAL LOW (ref 83.0–108.0)

## 2020-10-16 LAB — CULTURE, BLOOD (ROUTINE X 2)
Culture: NO GROWTH
Culture: NO GROWTH
Special Requests: ADEQUATE

## 2020-10-16 LAB — MAGNESIUM
Magnesium: 2.4 mg/dL (ref 1.7–2.4)
Magnesium: 2.4 mg/dL (ref 1.7–2.4)

## 2020-10-16 LAB — CALCIUM, IONIZED: Calcium, Ionized, Serum: 3.3 mg/dL — ABNORMAL LOW (ref 4.5–5.6)

## 2020-10-16 MED ORDER — DEXTROSE-NACL 5-0.2 % IV SOLN: INTRAVENOUS | Status: AC

## 2020-10-16 MED ORDER — QUETIAPINE FUMARATE 25 MG PO TABS
25.0000 mg | ORAL_TABLET | Freq: Every day | ORAL | Status: DC
  Administered 2020-10-16: 25 mg

## 2020-10-16 NOTE — Progress Notes (Signed)
Called by RN regarding patient with some oral bleeding and thick oral secretions with dried up blood at the roof of the mouth. Also noted to have increased respiratory rate up to the mid 30s. Recommended obtaining ABG as well as chest x-ray. Patient very somnolent and there is a language barrier-also received Seroquel which had to be given because he was very agitated and was trying to pull on his tubes. We will follow test results.  Addendum ABG with mild hypoxia, normal CO2.,  Mild alkalosis. Chest x-ray with unchanged bibasilar atelectasis. H&H at 10 PM 11.4/32.1  Recommendations: -Check CBC BMP in the morning. -Monitor closely -Appreciate RT assistance. -Hold tube feeds for now -We will have critical care assess if he continues to be exhibiting labored breathing and has difficulty maintaining airway.  -- Milon Dikes, MD [facility] Pager: [phone] If 7pm to 7am, please call on call as listed on AMION.

## 2020-10-16 NOTE — Progress Notes (Signed)
Cortrak tube removed to place NGT per doctor Smith's instruction due to inability to place NGT in other nares; placement heard with ausculation and gastric content returned into the tube; stat portable xray ordered to confirm tube placement.

## 2020-10-16 NOTE — Progress Notes (Signed)
Second dose of contrast given by this RN via NG tube.

## 2020-10-16 NOTE — Progress Notes (Addendum)
CT reviewed. I think the cortrak somehow eroded its distal tip through posterior jejunum (see series 7, image 57 on 10/13/20 scan showing possible posterior defect at distal end of tube which correlates with superior-most edge of RP abscess) so maybe the abscess is actually within the small bowel mesentery?  I have spoken with radiology and general surgery.  Will ask latter to formally assess tomorrow AM.  Fluoro I guess could give exact location of defect.  Not sure he would be great surgical candidate but will defer to general surgery.  For now, strict NPO, NGT to LIS.  Will update family.  Myrla Halsted MD PCCM

## 2020-10-16 NOTE — Progress Notes (Addendum)
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  Patient hospitalization was complicated by hypoxic respiratory failure due to pulmonary edema requiring BiPAP and Lasix.  He was awaiting ECF placement however on 10/10/2020 he became more lethargic and tachycardic with tachypnea.  Patient went into A. fib with RVR ,also had a fever.  Due to concern for severe sepsis, PCCM was consulted.  Patient found to have sepsis with right lower lobe pneumonia and likely aspiration.  Started on IV Zosyn.  Also started on amiodarone and heparin gtt. patient with right flank pain, CT abdomen pelvis showed perinephric abscess. Core track feeding tube was placed however patient started having tachycardic episodes with core track feeding. There was also previous history of peptic ulcer 30 years ago leading to partial gastrectomy. There was a concern that he could have perforated ulcer with drainage around the right kidney. KUB obtained showed no perforation. GI was consulted for back positive stools, however no EGD offered at this time. PCCM has been reconsulted to help with management. [facility] assumed care as primary service on 10/16/2020   Subjective   Patient seen and examined, tube feeding is currently on hold as patient became tachycardic with tube feeding last night. Family concerned that he has history of peptic ulcer with partial gastrectomy in the past. They are concerned about perforation.   Assessment/Plan:     1. Sepsis due to pneumonia-?  Aspiration.  Patient started on IV Zosyn, vancomycin.   2. Right perinephric abscess-patient had tenderness in right lower quadrant with guarding,  erythematous rash noted in right lower quadrant.  CT abdomen/pelvis showed right perinephric abscess.  S/p drain placement per IR yesterday.  Continue IV antibiotics as above.  Culture from abscess growing Klebsiella pneumonia, moderate Candida albicans. PCCM ordered barium swallow upper GI which was unremarkable. CT abdomen/pelvis has been reordered to rule out perforation. Tube feeds is currently on hold. Continue IV antibiotics.  3. Dysphagia-patient is high risk for aspiration, is unable to consume adequate calories.  Speech therapist discussed with patient and his wife, he agrees for core track feeding tube placement for short duration.  Core track feeding tube is in place will order core track feeding tube. Tube feeding is turned on hold as patient became tachycardic with tube feedings last night. 4. Hypernatremia-sodium is elevated 152, will start D5 1/4 normal saline at 75 mL/h. Follow BMP in am. No [place] given as core track feeding tube is on hold. 5. Paroxysmal atrial fibrillation with RVR-heart rate is controlled, continue heparin GTT, amiodarone i 6. 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  Continue ticagrelor 7. Transaminitis-patient has mild elevated liver enzymes, likely in setting of sepsis.  Liver function is slowly improving. 8. Melena-patient has past medical history of peptic ulcer s/p partial gastrectomy in [place].  Stool guaiac was positive. He was consulted however no EGD offered this time. Hemoglobin is stable. GI signed off. 9. Hypertension-Lopressor on hold due to hypotension 10. Acute kidney injury-patient baseline creatinine is around 0.8-1, creatinine has been steadily rising.  Today creatinine is 1.32,likely from prerenal azotemia.  Continue IV fluids as above. Follow BMP in am. 11. Hypokalemia-potassium  is 3.3, will replace potassium and follow BMP in am. 12. Diabetes mellitus type  2-CBG well controlled, continue sliding scale insulin NovoLog.       Lab Results  Component Value Date   SARSCOV2NAA NEGATIVE 10/08/2020     Scheduled medications:   .  stroke: mapping our early stages of recovery book   Does not apply Once  . [START ON 10/20/2020] amiodarone  200 mg Per Tube Daily   Followed by  . amiodarone  200 mg Per Tube BID  . chlorhexidine  15 mL Mouth Rinse BID  . Chlorhexidine Gluconate Cloth  6 each Topical Daily  . feeding supplement (PROSource TF)  45 mL Per Tube TID  . free water  200 mL Per Tube Q4H  . insulin aspart  0-9 Units Subcutaneous Q4H  . mouth rinse  15 mL Mouth Rinse q12n4p  . pantoprazole (PROTONIX) IV  40 mg Intravenous Q12H  . QUEtiapine  25 mg Oral QHS  . sodium chloride flush  5 mL Intracatheter Q8H  . ticagrelor  90 mg Oral BID   Or  . ticagrelor  90 mg Per Tube BID         CBG: Recent Labs  Lab 10/15/20 1952 10/16/20 0009 10/16/20 0413 10/16/20 0757 10/16/20 1123  GLUCAP 157* 197* 128* 147* 135*    SpO2: 93 % O2 Flow Rate (L/min): 6 L/min FiO2 (%): 50 %    CBC: Recent Labs  Lab 10/11/20 1706 10/11/20 1706 10/12/20 0314 10/12/20 0314 10/13/20 1009 10/14/20 0116 10/15/20 0408 10/15/20 2158 10/16/20 0508  WBC 5.4   < > 5.8  --  9.4 9.7 11.5*  --  12.2*  NEUTROABS 4.8  --   --   --   --   --   --   --   --   HGB 12.4*   < > 11.7*   < > 11.4* 10.7* 10.4* 11.4* 10.4*  HCT 36.7*   < > 34.3*   < > 32.4* 30.1* 29.6* 32.1* 29.2*  MCV 87.0   < > 86.0  --  84.8 82.7 82.2  --  83.4  PLT 218   < > 202  --  222 197 254  --  263   < > = values in this interval not displayed.    Basic Metabolic Panel: Recent Labs  Lab 10/12/20 0314 10/12/20 1814 10/13/20 1009 10/13/20 1658 10/14/20 0116 10/15/20 0408 10/15/20 1630 10/16/20 0508  NA 143  --  143  --  145 149*  --  152*  K 3.8  --  3.1*  --  3.5 3.3*  --  4.3  CL 113*  --  111  --  113* 116*  --  124*  CO2 19*  --  20*  --  22 20*  --  20*  GLUCOSE  132*  --  194*  --  151* 148*  --  144*  BUN 42*  --  43*  --  42* 45*  --  46*  CREATININE 1.61*  --  1.40*  --  1.35* 1.40*  --  1.32*  CALCIUM 8.0*  --  7.5*  --  7.5* 7.5*  --  7.4*  MG  --    < > 2.1 2.1 2.2  --  2.4 2.4  PHOS  --    < > 2.6 2.6 2.8  --  3.7 2.9   < > = values in this interval not displayed.     Liver  Function Tests: Recent Labs  Lab 10/11/20 1706 10/13/20 1009 10/15/20 0408  AST 101* 105* 134*  ALT 45* 60* 70*  ALKPHOS 96 163* 200*  BILITOT 1.3* 0.8 1.3*  PROT 5.3* 5.0* 5.1*  ALBUMIN 1.6* 1.4* 1.3*     Antibiotics: Anti-infectives (From admission, onward)   Start     Dose/Rate Route Frequency Ordered Stop   10/15/20 1630  vancomycin (VANCOREADY) IVPB 500 mg/100 mL        500 mg 100 mL/hr over 60 Minutes Intravenous Every 12 hours 10/15/20 0940     10/14/20 0400  vancomycin (VANCOREADY) IVPB 500 mg/100 mL  Status:  Discontinued        500 mg 100 mL/hr over 60 Minutes Intravenous Every 24 hours 10/13/20 0233 10/15/20 0940   10/13/20 0330  vancomycin (VANCOREADY) IVPB 750 mg/150 mL        750 mg 150 mL/hr over 60 Minutes Intravenous  Once 10/13/20 0233 10/13/20 0439   10/12/20 1800  ceFEPIme (MAXIPIME) 2 g in sodium chloride 0.9 % 100 mL IVPB  Status:  Discontinued        2 g 200 mL/hr over 30 Minutes Intravenous Every 24 hours 10/11/20 1718 10/11/20 1742   10/12/20 1800  vancomycin (VANCOREADY) IVPB 750 mg/150 mL  Status:  Discontinued        750 mg 150 mL/hr over 60 Minutes Intravenous Every 24 hours 10/11/20 1724 10/11/20 1758   10/12/20 0200  metroNIDAZOLE (FLAGYL) tablet 500 mg  Status:  Discontinued        500 mg Oral Every 8 hours 10/11/20 1715 10/11/20 1758   10/12/20 0030  piperacillin-tazobactam (ZOSYN) IVPB 3.375 g        3.375 g 12.5 mL/hr over 240 Minutes Intravenous Every 8 hours 10/11/20 1805     10/11/20 1830  piperacillin-tazobactam (ZOSYN) IVPB 3.375 g        3.375 g 100 mL/hr over 30 Minutes Intravenous  Once 10/11/20 1805  10/11/20 1839   10/11/20 1715  ceFEPIme (MAXIPIME) 2 g in sodium chloride 0.9 % 100 mL IVPB  Status:  Discontinued        2 g 200 mL/hr over 30 Minutes Intravenous STAT 10/11/20 1710 10/11/20 1742   10/11/20 1715  metroNIDAZOLE (FLAGYL) IVPB 500 mg  Status:  Discontinued        500 mg 100 mL/hr over 60 Minutes Intravenous STAT 10/11/20 1710 10/11/20 1807   10/11/20 1715  vancomycin (VANCOCIN) IVPB 1000 mg/200 mL premix  Status:  Discontinued        1,000 mg 200 mL/hr over 60 Minutes Intravenous STAT 10/11/20 1710 10/11/20 1804   10/24/2020 1541  ceFAZolin (ANCEF) 2-4 GM/100ML-% IVPB       Note to Pharmacy: [facility]   : cabinet override      10/13/2020 1541 10/06/20 0344       DVT prophylaxis: Heparin     Objective   Vitals:   10/16/20 0810 10/16/20 0910 10/16/20 1119 10/16/20 1259  BP:  136/90 (!) 150/74 130/83  Pulse: 88 95 75 75  Resp: (!) (!) 26  Temp:  99.9 F (37.7 C) 99.6 F (37.6 C)   TempSrc:  Axillary Axillary   SpO2: 94% 93% 97% 93%  Weight:        Intake/Output Summary (Last 24 hours) at 10/16/2020 1409 Last data filed at 10/16/2020 0909 Gross per 24 hour  Intake 1839.12 ml  Output 2145 ml  Net -305.88 ml  11/14 1901 - 11/16 0700 In: 3116.6 [I.V.:439.2] Out: 4065 [Urine:3050; Drains:1015]  Filed Weights   10/12/20 0500 10/13/20 0414  Weight: 56.5 kg 65 kg    Physical Examination:  General-appears lethargic Heart-S1-S2, regular, no murmur auscultated Lungs-clear to auscultation bilaterally, no wheezing or crackles auscultated Abdomen-soft, nontender, no organomegaly Extremities-no edema in the lower extremities Neuro-alert, oriented x3, left hemiparesis      Anticipated discharge date-10/20/2020 Barrier to discharge-multiple medical problems including perinephric abscess, atrial fibrillation, acute kidney injury, hyper natremia  Data Reviewed:   Recent Results (from the past 240 hour(s))  Culture, Urine     Status: None    Collection Time: 10/10/20  5:13 PM   Specimen: Urine, Catheterized  Result Value Ref Range Status   Specimen Description URINE, CATHETERIZED  Final   Special Requests NONE  Final   Culture   Final    NO GROWTH Performed at [facility], [address] [place], [place] [address]    Report Status 10/11/2020 FINAL  Final  Culture, blood (x 2)     Status: None (Preliminary result)   Collection Time: 10/11/20  7:13 PM   Specimen: BLOOD  Result Value Ref Range Status   Specimen Description BLOOD SITE NOT SPECIFIED  Final   Special Requests   Final    BOTTLES DRAWN AEROBIC AND ANAEROBIC Blood Culture results may not be optimal due to an inadequate volume of blood received in culture bottles   Culture   Final    NO GROWTH 4 DAYS Performed at [facility], [address] [place], [place] [address]    Report Status PENDING  Incomplete  Culture, blood (x 2)     Status: None (Preliminary result)   Collection Time: 10/11/20  7:15 PM   Specimen: BLOOD  Result Value Ref Range Status   Specimen Description BLOOD SITE NOT SPECIFIED  Final   Special Requests   Final    BOTTLES DRAWN AEROBIC AND ANAEROBIC Blood Culture adequate volume   Culture   Final    NO GROWTH 4 DAYS Performed at [facility], [address] [place], [place] [address]    Report Status PENDING  Incomplete  Culture, blood (routine x 2)     Status: None (Preliminary result)   Collection Time: 10/13/20  3:01 AM   Specimen: BLOOD LEFT HAND  Result Value Ref Range Status   Specimen Description BLOOD LEFT HAND  Final   Special Requests   Final    AEROBIC BOTTLE ONLY Blood Culture results may not be optimal due to an inadequate volume of blood received in culture bottles   Culture   Final    NO GROWTH 2 DAYS Performed at [facility], [address] [place], [place] [address]    Report Status PENDING  Incomplete  Culture, blood (routine x 2)     Status: None (Preliminary result)   Collection  Time: 10/13/20 10:10 AM   Specimen: BLOOD RIGHT ARM  Result Value Ref Range Status   Specimen Description BLOOD RIGHT ARM  Final   Special Requests   Final    BOTTLES DRAWN AEROBIC AND ANAEROBIC Blood Culture adequate volume   Culture   Final    NO GROWTH 2 DAYS Performed at [facility], [address] [place], [place] [address]    Report Status PENDING  Incomplete  Aerobic/Anaerobic Culture (surgical/deep wound)     Status: None (Preliminary result)   Collection Time: 10/14/20  12:08 PM   Specimen: Abscess  Result Value Ref Range Status   Specimen Description ABSCESS  Final   Special Requests RIGHT RETROPERITONEAL ABSCESS  Final   Gram Stain   Final    RARE WBC PRESENT, PREDOMINANTLY PMN MODERATE GRAM POSITIVE COCCI IN CHAINS IN PAIRS RARE GRAM POSITIVE RODS Performed at [facility], [address] [place], [place] [address]    Culture   Final    MODERATE KLEBSIELLA PNEUMONIAE MODERATE CANDIDA ALBICANS NO ANAEROBES ISOLATED; CULTURE IN PROGRESS FOR 5 DAYS    Report Status PENDING  Incomplete   Organism ID, Bacteria KLEBSIELLA PNEUMONIAE  Final      Susceptibility   Klebsiella pneumoniae - MIC*    AMPICILLIN RESISTANT Resistant     CEFAZOLIN <=4 SENSITIVE Sensitive     CEFEPIME <=0.12 SENSITIVE Sensitive     CEFTAZIDIME <=1 SENSITIVE Sensitive     CEFTRIAXONE <=0.25 SENSITIVE Sensitive     CIPROFLOXACIN <=0.25 SENSITIVE Sensitive     GENTAMICIN <=1 SENSITIVE Sensitive     IMIPENEM <=0.25 SENSITIVE Sensitive     TRIMETH/SULFA <=20 SENSITIVE Sensitive     AMPICILLIN/SULBACTAM <=2 SENSITIVE Sensitive     PIP/TAZO 16 SENSITIVE Sensitive     * MODERATE KLEBSIELLA PNEUMONIAE    No results for input(s): LIPASE, AMYLASE in the last 168 hours. Recent Labs  Lab 10/15/20 0408  AMMONIA 32    Cardiac Enzymes: Recent Labs  Lab 10/13/20 1009 10/15/20 0408  CKTOTAL 991* 630*    Studies:  DG Chest 1 View  Result Date: 10/16/2020 CLINICAL DATA:   Tachycardia EXAM: CHEST  1 VIEW COMPARISON:  10/13/2020 FINDINGS: Unchanged left basilar opacities. Hazy diffuse opacities are also unchanged. Esophageal catheter courses below the field of view. Cardiac silhouette size is normal. IMPRESSION: Unchanged left basilar opacities and hazy diffuse opacities bilaterally. Electronically Signed   By: Deatra Robinson M.D.   On: 10/16/2020 01:15   DG Abd 1 View  Result Date: 10/15/2020 CLINICAL DATA:  Abdominal pain EXAM: ABDOMEN - 1 VIEW COMPARISON:  Abdomen radiograph October 13, 2020 and CT abdomen and pelvis October 13, 2020 FINDINGS: There is a drain in the right lower quadrant. Feeding tube tip is in the distal stomach region. There are foci of contrast in colon and appendix. There is no bowel dilatation or air-fluid level to suggest bowel obstruction. No free air is appreciable on supine examination. IMPRESSION: No bowel obstruction. No free air evident on supine examination. Tube and catheter positions as noted. Electronically Signed   By: Bretta Bang III M.D.   On: 10/15/2020 13:07   DG Abd Portable 1V  Result Date: 10/16/2020 CLINICAL DATA:  Nasogastric tube placement EXAM: PORTABLE ABDOMEN - 1 VIEW COMPARISON:  One day prior FINDINGS: Removal of feeding tube with placement of nasogastric tube. This terminates at the gastric body. Percutaneous drain projects over the right upper pelvis. Gas-filled small bowel loops at up to 2.9 cm throughout the left side of the abdomen. Minimal contrast within the ascending colon. IMPRESSION: Nasogastric tube terminating at the gastric body. Gas-filled upper normal sized small bowel loops could mild adynamic ileus or less likely low-grade partial small bowel obstruction. Electronically Signed   By: Jeronimo Greaves M.D.   On: 10/16/2020 13:29       [place] [address] Tyashia Morrisette   [facility] If 7PM-7AM, please contact night-coverage at [email or web address], Office  [phone]   10/16/2020, 2:08 PM  LOS: 11 days

## 2020-10-16 NOTE — Progress Notes (Signed)
SLP Cancellation Note  Patient Details Name: Santi Troung MRN: [id number] DOB: 1956-02-05   Cancelled treatment:       Reason Eval/Treat Not Completed: Other (comment). Pt for abdominal CT, concern for bleeding. Will f/u as appropriate.     Consandra Laske, Riley Nearing 10/16/2020, 1:53 PM

## 2020-10-16 NOTE — Progress Notes (Signed)
STROKE TEAM PROGRESS NOTE   INTERVAL HISTORY Wife and son are at bedside. Wife sleeping at the bedside. Pt lethargic, intermittently open eyes but mildly restless. Overnight had tachypnea and tachycardia with TF, and TF now on hold. CCM consulted to rule out stomach perforation. Na 152 and currently on D5 with 0.2% Na.   OBJECTIVE Vitals:   10/16/20 0910 10/16/20 1119 10/16/20 1259 10/16/20 1547  BP: 136/90 (!) 150/74 130/83 (!) 134/91  Pulse: 95 75 75 67  Resp: 20 18 (!) 26 (!) 24  Temp: 99.9 F (37.7 C) 99.6 F (37.6 C)  98.2 F (36.8 C)  TempSrc: Axillary Axillary  Oral  SpO2: 93% 97% 93% 99%  Weight:       CBC:  Recent Labs  Lab 10/11/20 1706 10/12/20 0314 10/15/20 0408 10/15/20 0408 10/15/20 2158 10/16/20 0508  WBC 5.4   < > 11.5*  --   --  12.2*  NEUTROABS 4.8  --   --   --   --   --   HGB 12.4*   < > 10.4*   < > 11.4* 10.4*  HCT 36.7*   < > 29.6*   < > 32.1* 29.2*  MCV 87.0   < > 82.2  --   --  83.4  PLT 218   < > 254  --   --  263   < > = values in this interval not displayed.   Basic Metabolic Panel:  Recent Labs  Lab 10/14/20 0116 10/15/20 0408 10/15/20 1630 10/16/20 0508  NA  --  149*  --  152*  K  --  3.3*  --  4.3  CL  --  116*  --  124*  CO2  --  20*  --  20*  GLUCOSE  --  148*  --  144*  BUN  --  45*  --  46*  CREATININE  --  1.40*  --  1.32*  CALCIUM  --  7.5*  --  7.4*  MG   < >  --  2.4 2.4  PHOS   < >  --  3.7 2.9   < > = values in this interval not displayed.   Lipid Panel:     Component Value Date/Time   CHOL 173 10/06/2020 0522   CHOL 216 (H) 03/09/2018 1620   TRIG 396 (H) 10/07/2020 0527   HDL 33 (L) 10/06/2020 0522   HDL 59 03/09/2018 1620   CHOLHDL 5.2 10/06/2020 0522   VLDL 55 (H) 10/06/2020 0522   LDLCALC 85 10/06/2020 0522   LDLCALC 136 (H) 03/09/2018 1620   HgbA1c:  Lab Results  Component Value Date   HGBA1C 5.8 (H) 10/06/2020   Urine Drug Screen: No results found for: LABOPIA, COCAINSCRNUR, LABBENZ, AMPHETMU, THCU,  LABBARB  Alcohol Level No results found for: [facility]  IMAGING  CT HEAD WO CONTRAST 10/11/2020 1. No acute abnormality.  2. Minimal diffuse cerebral and cerebellar atrophy.  3. Mild chronic small vessel white matter ischemic changes in both cerebral hemispheres.   CT Angio Head W or Wo Contrast CT Angio Neck W and/or Wo Contrast CT CEREBRAL PERFUSION W CONTRAST 10-11-20 1. 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  2. Right MCA infarct with extensive penumbra.  3. 4 mm right supraclinoid ICA aneurysm.  4. Widely patent cervical carotid and vertebral arteries.  5. Aortic Atherosclerosis (ICD10-I70.0).   CT HEAD WO CONTRAST 2020/10/11 Hypodensity in the right lateral temporal lobe now shows mild hemorrhage  or contrast enhancement. This is most consistent with acute infarct. There has been interval stenting of the right middle cerebral artery.   CT HEAD WO CONTRAST 10/27/2020 Right temporal lobe hypodensity compatible with acute infarct. Decreased hyperdensity within this stroke compared with 4 hours previously compatible with contrast staining rather than hemorrhage. Small area of acute infarct in the right parietal lobe now evident. Right MCA stent.   MRI brain: 10/06/20 1. Fairly extensive patchy acute/early subacute infarcts within the right MCA/watershed territory.  2. Additional small acute/early subacute infarcts within the dorsal right thalamus and left basal ganglia. 3. Background moderate chronic small vessel ischemic disease.  MRA head: 10/06/20 1. Interval stenting of the M1/M2 right middle cerebral artery. Flow related signal is present proximal and distal to the stent suggestive of stent patency. 2. 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  DG Abd 1 View 10/03/2020 Gastric catheter within the stomach kinked at the proximal side port. This could be withdrawn slightly S necessary.   Transthoracic Echocardiogram   10/06/2020 1. Left ventricular ejection fraction, by estimation, is 70 to 75%. The left ventricle has hyperdynamic function. The left ventricle has no regional wall motion abnormalities. Left ventricular diastolic parameters are consistent with Grade II diastolic dysfunction (pseudonormalization). Elevated left atrial pressure.  2. Right ventricular systolic function is normal. The right ventricular size is normal.  3. The mitral valve is normal in structure. Mild mitral valve regurgitation. No evidence of mitral stenosis.  4. The aortic valve is normal in structure. Aortic valve regurgitation is not visualized. No aortic stenosis is present.  5. The inferior vena cava is dilated in size with <50% respiratory variability, suggesting right atrial pressure of 15 mmHg.  ECG - SR rate 70 BPM. (See cardiology reading for complete details)  DG Chest 1 View  Result Date: 10/16/2020 CLINICAL DATA:  Tachycardia EXAM: CHEST  1 VIEW COMPARISON:  10/13/2020 FINDINGS: Unchanged left basilar opacities. Hazy diffuse opacities are also unchanged. Esophageal catheter courses below the field of view. Cardiac silhouette size is normal. IMPRESSION: Unchanged left basilar opacities and hazy diffuse opacities bilaterally. Electronically Signed   By: Deatra Robinson M.D.   On: 10/16/2020 01:15   DG Abd Portable 1V  Result Date: 10/16/2020 CLINICAL DATA:  Nasogastric tube placement EXAM: PORTABLE ABDOMEN - 1 VIEW COMPARISON:  One day prior FINDINGS: Removal of feeding tube with placement of nasogastric tube. This terminates at the gastric body. Percutaneous drain projects over the right upper pelvis. Gas-filled small bowel loops at up to 2.9 cm throughout the left side of the abdomen. Minimal contrast within the ascending colon. IMPRESSION: Nasogastric tube terminating at the gastric body. Gas-filled upper normal sized small bowel loops could mild adynamic ileus or less likely low-grade partial small bowel obstruction.  Electronically Signed   By: Jeronimo Greaves M.D.   On: 10/16/2020 13:29    PHYSICAL EXAM    Temp:  [98.1 F (36.7 C)-100.9 F (38.3 C)] 98.2 F (36.8 C) (11/16 1547) Pulse Rate:  [67-95] 67 (11/16 1547) Resp:  [18-34] 24 (11/16 1547) BP: (130-159)/(74-91) 134/91 (11/16 1547) SpO2:  [93 %-99 %] 99 % (11/16 1547)  General - Well nourished, well developed, mild restless.  Ophthalmologic - fundi not visualized due to noncooperation.  Cardiovascular - Regular rhythm and rate.  Abdominal - soft, mild tenderness on palpation, drainage tube in place continues to drainge.   Neuro -  lethargic, but still orientated to self, age, time and people and place.  Severe dysarthria, no aphasia but paucity of speech, able to name and repeat, follows simple commands. Tracking bilaterally, seems to have intact visual field. PERRL, no gaze deviation or palsy. Left facial droop. Tongue midline. Left UE and LE flaccid. RUE at least 4/5 and RLE at least 3/5. Sensation symmetrical, FTN intact on the right and gait not tested.   ASSESSMENT/PLAN Mr. Mark May is a 64 y.o. male with history of hypertension, BPH, sciatica, stomach ulcer status post surgery in the past presented to ER at [facility] for acute onset left-sided weakness, left facial droop and left hemianopia.  CT head and neck showed right M1 high-grade stenosis versus near occlusive thrombosis.  CT perfusion showed large penumbra.  Patient [facility] thromb[facility].  He did not receive IV t-PA due to late presentation (>4.5 hours from time of onset). IR - complete occlusion of RT MCA M 1 seg - thrombectomy ->TICI 3 revascularization with placement of rescue stent m4 mm x 24 mm Atlas for reocclusion with restoration of TICI 3 .  Stroke: R MCA infarct s/p IR TICI3 R M1 w/ stent placement - likely embolic due to new diagnosed afib  CT Head - No acute abnormality. Minimal diffuse cerebral and cerebellar atrophy.    CTA H&N -  Severe distal right M1 stenosis or subocclusive thrombus. 4 mm right supraclinoid ICA aneurysm. Widely patent cervical carotid and vertebral arteries.   CTP - Right MCA infarct with extensive penumbra.   MRI head - Fairly extensive patchy acute/early subacute infarcts within the right MCA/watershed territory. Additional small acute/early subacute infarcts within the dorsal right thalamus and left basal ganglia.  MRA head -  Interval stenting of the M1/M2 right middle cerebral artery. Flow related signal is present proximal and distal to the stent suggestive of stent patency. 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  2D Echo - EF 70 - 75%. No cardiac source of emboli identified.   UE doppler neg DVT  Loyal JacobsonSars Corona Virus 2 - negative  LDL - 85  HgbA1c - 5.8  VTE prophylaxis - SCDs  No antithrombotic prior to admission, now on Brilinta (ticagrelor) 90 mg bid. Heparin IV discontinued due to GIB.    Therapy recommendations:  CIR  Disposition:  Pending  Atrial Fibrillation w/ RVR, new diagnosis 11/11  CHA2DS2-VASc Score = at least 5,  ?2 oral anticoagulation recommended  [age] in Years:  2565-74   +1    Sex:  Male   0    Hypertension History:  yes   +1     Diabetes Mellitus:  0  Congestive Heart Failure History:  yes   +1   Vascular Disease History:  0     Stroke/TIA/Thromboembolism History:  yes   +2 . Off amio gtt -> amio po now . On IV heparin re-started 11/14 -> stopped 11/15 due to GIB   Sepsis with fever Retroperitoneal abscess RLL PNA, likely aspiration  CXR 11/13 persistent retrocardiac left base collapse/consolidation and diffuse bilateral airspace disease  Tmax 102.6->101.2-> afebrile  WBC 5.8->9.4-> 9.7->11.5->12.2  On Zosyn (started 11/12) and vanco (dose modified 11/15)  Blood Cx NGTD  CT abdomen and pelvis right retroperitoneal abscess  S/p abscess drainage - continue to drain  Source of retroperitoneal abscess unclear -  GU vs. GI - urology and GI on board  AUB no free air  CCM on board   AKI  Cre 1.48-1.69-1.96->1.61->1.40->1.35->1.40->1.32  Stopped lisinopril   On  FW 200 Q4h  On IVF with D5+0.2%NS  Avoid nephrotoxins  UGIB Blood loss anemia  Hb 12.4->10.7->10.4->11.4->10.4  Stool occult blood positive  GI on board - signed off  Recommend PPI IV bid -> at discharge PPI po Bid for one month and then qd  Hypertension Tachycardia  Home BP meds: Amlodipine  off Cleviprex  Stable . BP goal <180 . Stopped lisinopril d/t rising Cre . Long-term BP goal normotensive  Hyperlipidemia  Home Lipid lowering medication: none  LDL 85, goal < 70  Off lipitor now due to elevated CK level and increasing LFTs  CK 991->630  ALT/AST - 101/45->105/60->134/70  Dysphagia . Secondary to stroke . on FW and IVF . TF on hold now . Speech on board   Other Stroke Risk Factors  Advanced age  Other Active Problems  Aortic Atherosclerosis (ICD10-I70.0).   4 mm right supraclinoid ICA aneurysm.  Hypokalemia - 3.1 ->3.5->3.3   Hx of PUD s/p surgery 30 years ago  Hospital day # 11   Patient condition worsened within the last 24 hours, has developed tachycardia and tachypnea, continues to have lethargy, GIB, and I have discussed with Dr. Sharl Ma and Dr. Katrinka Blazing. I spent  35 minutes in total face-to-face time with the patient, more than 50% of which was spent in counseling and coordination of care, reviewing test results, images and medication, and discussing the diagnosis, treatment plan and potential prognosis. This patient's care requiresreview of multiple databases, neurological assessment, discussion with family, other specialists and medical decision making of high complexity. I had long discussion with son at bedside, updated pt current condition, treatment plan and potential prognosis, and answered all the questions. He expressed understanding and appreciation.    Marvel Plan, MD PhD Stroke  Neurology 10/16/2020 5:14 PM    to contact Stroke Continuity provider, please refer to [email or web address]. After hours, contact General Neurology

## 2020-10-16 NOTE — Progress Notes (Signed)
Referring Physician(s): Lorin Glass (CCM)  Supervising Physician: Ruel Favors  Patient Status:  [facility] - In-pt   Chief Complaint: None  Subjective:  Right retroperitoneal/perinephric abscess s/p right retroperitoneal drain placement in IR 10/14/2020. Patient awake and alert laying in bed, nonverbal (possibly secondary to language barrier), appears agitated- does not sit still in bed. Wife at bedside. Right retroperitoneal drain site c/d/i.   Allergies: Patient has no known allergies.  Medications: Prior to Admission medications   Medication Sig Start Date End Date Taking? Authorizing Provider  allopurinol (ZYLOPRIM) 100 MG tablet Take 3 tablets (300 mg total) by mouth daily. Patient not taking: Reported on 10/09/2020 02/17/18   Reubin Milan, MD  amLODipine (NORVASC) 10 MG tablet Take 1 tablet (10 mg total) by mouth daily. Patient not taking: Reported on 10/09/2020 02/17/18   Reubin Milan, MD  tamsulosin [facility]) 0.4 MG CAPS capsule Take 1 capsule (0.4 mg total) by mouth daily after supper. Patient not taking: Reported on 10/09/2020 03/09/18   Reubin Milan, MD     Vital Signs: BP 136/90 (BP Location: Right Arm)   Pulse 95   Temp 99.9 F (37.7 C) (Axillary)   Resp 20   Wt 143 lb 4.8 oz (65 kg)   SpO2 93%   BMI 22.44 kg/m   Physical Exam Vitals and nursing note reviewed.  Constitutional:      Comments: Agitated. Nonverbal.  Pulmonary:     Effort: Pulmonary effort is normal. No respiratory distress.  Abdominal:     Comments: Right retroperitoneal drain site without tenderness, erythema, drainage, or active bleeding; approximately 25 cc thick brown fluid in suction bulb.  Skin:    General: Skin is warm and dry.  Neurological:     Mental Status: He is alert.     Comments: Agitated. Nonverbal.     Imaging: CT ABDOMEN PELVIS WO CONTRAST  Result Date: 10/13/2020 CLINICAL DATA:  64 year old male with abdominal abscess or infection. EXAM: CT  ABDOMEN AND PELVIS WITHOUT CONTRAST TECHNIQUE: Multidetector CT imaging of the abdomen and pelvis was performed following the standard protocol without IV contrast. COMPARISON:  Renal ultrasound dated 10/11/2020. FINDINGS: Evaluation of this exam is limited in the absence of intravenous contrast. Lower chest: Partially visualized small bilateral pleural effusions with complete consolidative changes of the visualized lower lobes. No intra-abdominal free air. There is a small free fluid in the pelvis. Hepatobiliary: The liver is unremarkable. No intrahepatic biliary dilatation. There are multiple stones in the gallbladder. No pericholecystic fluid or evidence of acute cholecystitis by CT. Pancreas: Unremarkable. No pancreatic ductal dilatation or surrounding inflammatory changes. Spleen: Normal in size without focal abnormality. Adrenals/Urinary Tract: The adrenal glands unremarkable. Multiple nonobstructing right renal calculi measure up to 4 mm in the inferior pole of the right kidney. No hydronephrosis. There is no hydronephrosis or nephrolithiasis on the left. Multiple bilateral renal hypodense lesions are not characterized on this noncontrast CT. There is fluid collection with scattered pockets of air in the right perinephric space and along the right psoas muscle extending down into the pelvis. The fluid collection measures 7.4 x 4.3 cm in greatest axial dimensions and 17 cm in craniocaudal length. There is air within the urinary bladder, possibly related to recent instrumentation. An infectious process is not excluded. Stomach/Bowel: A feeding tube is noted with tip appearing in the distal stomach. Evaluation however is limited due to respiratory motion. Contrast is noted in the colon. There is distal colonic diverticulosis without active inflammatory  changes. No bowel obstruction. The appendix is not visualized with certainty. Vascular/Lymphatic: Moderate aortoiliac atherosclerotic disease. The IVC is  unremarkable. No portal venous gas. There is no adenopathy. Reproductive: The prostate and seminal vesicles are grossly unremarkable. Other: There is mild diffuse subcutaneous edema. No fluid collection. Musculoskeletal: No acute or significant osseous findings. IMPRESSION: 1. Large right perinephric fluid collection and pockets of air concerning for an infectious process/abscess. Clinical correlation is recommended. 2. Cholelithiasis. 3. Nonobstructing right renal calculi. No hydronephrosis. 4. Colonic diverticulosis. No bowel obstruction. 5. Partially visualized small bilateral pleural effusions with complete consolidative changes of the visualized lower lobes. 6. Aortic Atherosclerosis (ICD10-I70.0). Electronically Signed   By: Elgie Collard M.D.   On: 10/13/2020 16:42   DG Chest 1 View  Result Date: 10/16/2020 CLINICAL DATA:  Tachycardia EXAM: CHEST  1 VIEW COMPARISON:  10/13/2020 FINDINGS: Unchanged left basilar opacities. Hazy diffuse opacities are also unchanged. Esophageal catheter courses below the field of view. Cardiac silhouette size is normal. IMPRESSION: Unchanged left basilar opacities and hazy diffuse opacities bilaterally. Electronically Signed   By: Deatra Robinson M.D.   On: 10/16/2020 01:15   DG Abd 1 View  Result Date: 10/15/2020 CLINICAL DATA:  Abdominal pain EXAM: ABDOMEN - 1 VIEW COMPARISON:  Abdomen radiograph October 13, 2020 and CT abdomen and pelvis October 13, 2020 FINDINGS: There is a drain in the right lower quadrant. Feeding tube tip is in the distal stomach region. There are foci of contrast in colon and appendix. There is no bowel dilatation or air-fluid level to suggest bowel obstruction. No free air is appreciable on supine examination. IMPRESSION: No bowel obstruction. No free air evident on supine examination. Tube and catheter positions as noted. Electronically Signed   By: Bretta Bang III M.D.   On: 10/15/2020 13:07   DG Abd 1 View  Result Date:  10/13/2020 CLINICAL DATA:  Abdominal distension EXAM: ABDOMEN - 1 VIEW COMPARISON:  October 10, 2020 FINDINGS: Flocculated enteric contrast is seen within the colon. Moderate predominately RIGHT-sided colonic stool burden. Gaseous distension of loops of colon without dilation. Feeding tube tip terminates over the distal stomach. Probable nephrolithiasis. Visualized osseous structures are unremarkable. IMPRESSION: Feeding tube tip terminates over the distal stomach. Electronically Signed   By: Meda Klinefelter MD   On: 10/13/2020 14:30   DG Chest Port 1 View  Result Date: 10/13/2020 CLINICAL DATA:  Respiratory failure. EXAM: PORTABLE CHEST 1 VIEW COMPARISON:  09/10/2020 FINDINGS: 0515 hours. Low lung volumes. Retrocardiac left base collapse/consolidation again noted with diffuse interstitial and bilateral airspace disease. Small bilateral effusions persist. A feeding tube passes into the stomach although the distal tip position is not included on the film. Telemetry leads overlie the chest. IMPRESSION: Low volume film with persistent retrocardiac left base collapse/consolidation and diffuse bilateral airspace disease. Electronically Signed   By: Kennith Center M.D.   On: 10/13/2020 08:12   CT IMAGE GUIDED DRAINAGE BY PERCUTANEOUS CATHETER  Result Date: 10/14/2020 CLINICAL DATA:  Large retroperitoneal abscess centered primarily inferior to the right kidney. EXAM: CT GUIDED CATHETER DRAINAGE OF RIGHT RETROPERITONEAL ABSCESS ANESTHESIA/SEDATION: 2.0 mg IV Versed 100 mcg IV Fentanyl Total Moderate Sedation Time:  12 minutes The patient's level of consciousness and physiologic status were continuously monitored during the procedure by Radiology nursing. PROCEDURE: The procedure, risks, benefits, and alternatives were explained to the patient. Questions regarding the procedure were encouraged and answered. The patient understands and consents to the procedure. A foreign language interpreter was utilized for  consent.  A time out was performed prior to initiating the procedure. CT was performed through the abdomen in a prone position. The right translumbar region was prepped with chlorhexidine in a sterile fashion, and a sterile drape was applied covering the operative field. A sterile gown and sterile gloves were used for the procedure. Local anesthesia was provided with 1% Lidocaine. Under CT guidance, an 18 gauge trocar needle was advanced into the right retroperitoneal space. After return of fluid, a guidewire was advanced. The tract was dilated and a 12 French percutaneous drainage catheter placed. Additional fluid sample was aspirated from the drain and sent for culture analysis. Catheter position was confirmed by CT. The catheter was attached to suction bulb drainage. It was secured at the skin with a Prolene retention suture and StatLock device. COMPLICATIONS: None FINDINGS: The retroperitoneal collection yielded reddish brown fluid. After drain placement there is copious return of fluid. IMPRESSION: CT-guided percutaneous catheter drainage of right retroperitoneal abscess yielding reddish brown fluid. A fluid sample was sent for culture analysis. A 12 French drainage catheter was placed and attached to suction bulb drainage. Electronically Signed   By: Irish Lack M.D.   On: 10/14/2020 12:47   [place] EKG SITE RITE  Result Date: 10/13/2020 If Site Rite image not attached, placement could not be confirmed due to current cardiac rhythm.   Labs:  CBC: Recent Labs    10/13/20 1009 10/13/20 1009 10/14/20 0116 10/15/20 0408 10/15/20 2158 10/16/20 0508  WBC 9.4  --  9.7 11.5*  --  12.2*  HGB 11.4*   < > 10.7* 10.4* 11.4* 10.4*  HCT 32.4*   < > 30.1* 29.6* 32.1* 29.2*  PLT 222  --  197 254  --  263   < > = values in this interval not displayed.    COAGS: Recent Labs    10/18/2020 1246 10/11/20 1706  INR 0.9 1.1  APTT 25 41*    BMP: Recent Labs    10/13/20 1009 10/14/20 0116  10/15/20 0408 10/16/20 0508  NA 143 145 149* 152*  K 3.1* 3.5 3.3* 4.3  CL 111 113* 116* 124*  CO2 20* 22 20* 20*  GLUCOSE 194* 151* 148* 144*  BUN 43* 42* 45* 46*  CALCIUM 7.5* 7.5* 7.5* 7.4*  CREATININE 1.40* 1.35* 1.40* 1.32*  GFRNONAA 56* 58* 56* 60*    LIVER FUNCTION TESTS: Recent Labs    10/08/20 0727 10/11/20 1706 10/13/20 1009 10/15/20 0408  BILITOT 2.4* 1.3* 0.8 1.3*  AST 53* 101* 105* 134*  ALT 38 45* 60* 70*  ALKPHOS 121 96 163* 200*  PROT 5.9* 5.3* 5.0* 5.1*  ALBUMIN 2.6* 1.6* 1.4* 1.3*    Assessment and Plan:  Right retroperitoneal/perinephric abscess s/p right retroperitoneal drain placement in IR 10/14/2020. Right retroperitoneal drain site stable with approximately 25 cc thick brown fluid in suction bulb (additional 295 cc output from drain in past 24 hours). Continue current drain management- continue with Qshift flushes/monitor of output. Plan for repeat CT when output <10 cc/day (assess for possible removal). Further plans per neurology/TRH/GI- appreciate and agree with management. IR to follow.  Tele-interpretor [phone] was used throughout today's interaction.    Electronically Signed: Elwin Mocha, PA-C 10/16/2020, 9:53 AM   I spent a total of 25 Minutes at the the patient's bedside AND on the patient's hospital floor or unit, greater than 50% of which was counseling/coordinating care for right retroperitoneal abscess s/p right retroperitoneal drain placement.

## 2020-10-16 NOTE — Progress Notes (Addendum)
NAME:  Mark May, MRN:  [id number], DOB:  Sep 18, 1956, LOS: 11 ADMISSION DATE:  10/10/2020, CONSULTATION DATE: 10/07/2020 REFERRING MD: Dr. Curtis Sites, CHIEF COMPLAINT: Left-sided weakness  Brief History   64 year old male with hypertension and hyperlipidemia who presented with left-sided weakness, noted to have right MCA occlusion status post thrombectomy and stent placement 11/5.  Hospitalization complicated by hypoxic respiratory failure secondary to pulmonary edema requiring BiPAP and lasix.  Awaiting CIR placement however on the evening of 10/10, he was noted to become more lethargic with tachycardia and tachypnea.  Had complained of abdominal pain with some distention and chest pain which resolved after burping.  KUB obtained which was normal.  On 11/11, some what more responsive but now with increasing sCr in which lisinopril was stopped and development of fever 102.6  TRH initially consulted for help with medical management however on their evaluation, PCCM consulted given ill appearance and developing hypotension and new onset Afib with RVR found to have sepsis with RLL pneumonia likely due to aspiration started on zosyn. PCCM consulted for continue right abdominal pain and distension with concern for bowel perforation secondary to Cortrak.  Past Medical History  Hypertension Peptic ulcer disease Gout BPH Sciatica   Significant Hospital Events   11/5 Mechanical thrombectomy of right MCA and stent 11/5 11/11 PCCM reconsulted  11/12 Cortrak placed Consults:  IR Neurology PCCM 11/5- 11/8; 11/11; 11/16 [facility]  Procedures:  11/6 right femoral sheath was removed  Significant Diagnostic Tests:  11/5 CT head: No acute abnormality. Minimal diffuse cerebral and cerebellar atrophy. Mild chronic small vessel white matter ischemic changes in both cerebral hemispheres  11/5 CTA head and neck: Severe distal right M1 stenosis or subocclusive thrombus, Right MCA infarct with extensive penumbra,  4 mm right supraclinoid ICA aneurysm. Widely patent cervical carotid and vertebral arteries.  11/5 postprocedure CT head: Hypodensity in the right lateral temporal lobe now shows mild hemorrhage or contrast enhancement. This is most consistent with acute infarct. There has been interval stenting of the right middle cerebral artery.  11/6 MRI brain: 1. Fairly extensive patchy acute/early subacute infarcts within the right MCA/watershed territory.  2. Additional small acute/early subacute infarcts within the dorsal right thalamus and left basal ganglia. 3. Background moderate chronic small vessel ischemic disease.  11/6  MRA head: 1. Interval stenting of the M1/M2 right middle cerebral artery. Flow related signal is present proximal and distal to the stent suggestive of stent patency. 2. 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  11/5 DG Abd 1 View Gastric catheter within the stomach kinked at the proximal side port. This could be withdrawn slightly S necessary.   11/6 Transthoracic Echocardiogram  1. Left ventricular ejection fraction, by estimation, is 70 to 75%. The left ventricle has hyperdynamic function. The left ventricle has no regional wall motion abnormalities. Left ventricular diastolic parameters are consistent with Grade II diastolic dysfunction (pseudonormalization). Elevated left atrial pressure.  2. Right ventricular systolic function is normal. The right ventricular size is normal.  3. The mitral valve is normal in structure. Mild mitral valve regurgitation. No evidence of mitral stenosis.  4. The aortic valve is normal in structure. Aortic valve regurgitation is not visualized. No aortic stenosis is present.  5. The inferior vena cava is dilated in size with <50% respiratory variability, suggesting right atrial pressure of 15 mmHg.  11/10 KUB >> normal gas pattern  11/13 CT abdomen and pelvis wo contrast 1. Large right  perinephric fluid collection  and pockets of air concerning for an infectious process/abscess. Clinical correlation is recommended. 2. Cholelithiasis. 3. Nonobstructing right renal calculi. No hydronephrosis. 4. Colonic diverticulosis. No bowel obstruction. 5. Partially visualized small bilateral pleural effusions with complete consolidative changes of the visualized lower lobes. 6. Aortic Atherosclerosis (ICD10-I70.0). Micro Data:  11/5 MRSA PCR negative 11/10 UC >> 11/11 BCx2 >>  Antimicrobials:  11/5 cefazolin 11/11 Zosyn > 11/15 Vancomycin Interim history/subjective:  Found to have blood in mouth overnight by RN. This morning Son and wife at beside. Son noted patient became tachycardic with tube feedings. Patient appears restless in bed, intermittently following commands. Not complaining of significant right sided abdominal pain today.  Objective   Blood pressure (!) 150/74, pulse 75, temperature 99.6 F (37.6 C), temperature source Axillary, resp. rate 18, weight 65 kg, SpO2 97 %.        Intake/Output Summary (Last 24 hours) at 10/16/2020 1152 Last data filed at 10/16/2020 1610 Gross per 24 hour  Intake 2079.69 ml  Output 2245 ml  Net -165.31 ml   Filed Weights   10/12/20 0500 10/13/20 0414  Weight: 56.5 kg 65 kg    Examination: General: Thin asian male, restless in bed HENT: Dark black dried material in posterior oropharynx, does not appear to have active bleeding, NG tube in place. Lungs: breathing labored and tachypnic on 6L O2, no accessory muscle use, lungs clear to ascultation bilaterally Cardiovascular: normal S1 and S2 regular rhythm and rate, no mumur  Abdomen: soft, no tender, non distended, active BS, no masses, no guarding, no rebound,  Right retroperitoneal drain in place Extremities: No edema  Neuro: Alert, follows some commands, restless in bed, 4/5 strength of RUE and RLL, minimal movment of left side GU: urinary cath in place  Assessment & Plan:    Sepsis due to right lower lobe pneumonia likely aspiration  Right sided pleural effusion on CXR today and on bedside US with atelectasis appears small volume, saturating well 6L does does not appear to be in significant respiratory distress, drainage unlikely to provide significant symptomatic benefit.  Continue IV Zosyn and vancomycin  Retroperitoneal abscess  Upper GI bleeding CT abdomen/pelvis on 11/13 Large right perinephric fluid collection and pockets of air concerning for an infectious process/abscess  S/p drain placement per IR yesterday 11/14.  Continue IV antibiotics as above.  Culture from abscess growing klebsiella pneumonia and moderate yeast. 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 Decreased to 1.32 today  -Follow BMP   Diabetes Mellitus Type 2  -continue SSI    Hypertension Hyperlipidemia Statin and antihypertensive held in setting of AKI  [facility] PGY-1 10/16/20 11:58 AM

## 2020-10-16 NOTE — Progress Notes (Signed)
Physical Therapy Treatment Patient Details Name: Mark May MRN: [id number] DOB: 08-Jul-1956 Today's Date: 10/16/2020    History of Present Illness Pt is 64 year old male with hypertension and hyperlipidemia who presented with left-sided weakness, noted to have right MCA occlusion status post thrombectomy and stent placement 11/5.  Hospitalization complicated by hypoxic respiratory failure secondary to pulmonary edema requiring BiPAP and lasix. On 10/10/2020 he became more lethargic and tachycardic with tachypnea.  Patient went into A. fib with RVR ,also had a fever.  Due to concern for severe sepsis, PCCM was consulted.  Patient found to have sepsis with right lower lobe pneumonia and likely aspiration.  Placement of drain for R retroperitoneal abscess on 11/14      PT Comments    Pt seen asleep upon arrival with wife and son present in room, Son states he will translate for pt (recommend use of virtual translator to ensure all aspects of session are being translated). Pt found without O2 with SPO2 93%, performed rolling for hygiene with pt able to assist with rolling L with use of bedrails, RR increased to 42 and O2 dropped to 90%, nasal canula donned with improves SpO2 to 86%. Pt requiring increased assist (mod A) to roll R. pt told son he was too tired to do anymore or sit up on the EOB, pt agreeable to reposition in bed. Attempted to perform bridges to increase weight bearing through LLE but pt unable to follow commands via son/too tired to perform.Pt positioned on R side with pillows between legs, under L arm and towel roll under L shoulder for support. Pt continues to demonstrate deficits in strength, coordination, endurance, balance and safety and will benefit from skilled PT to address deficits to maximize independence with functional mobility prior to discharge. Continue to recommend CIR for additional rehab    Follow Up Recommendations  CIR;Supervision/Assistance - 24 hour     Equipment  Recommendations  3in1 (PT);Wheelchair (measurements PT);Wheelchair cushion (measurements PT);Hospital bed    Recommendations for Other Services Rehab consult     Precautions / Restrictions Precautions Precautions: Fall Precaution Comments: L side hemiparesis; monitor BP; watch O2, Drain on R side of back Restrictions Weight Bearing Restrictions: No    Mobility  Bed Mobility Overal bed mobility: Needs Assistance Bed Mobility: Rolling Rolling: Mod assist;+2 for safety/equipment;Min assist         General bed mobility comments: performed rolling R and L for hygiene management; pt able to roll to the L min A wtih reaching with RUE, pt mod A to roll L  Transfers                    Ambulation/Gait                 Stairs             Wheelchair Mobility    Modified Rankin (Stroke Patients Only) Modified Rankin (Stroke Patients Only) Pre-Morbid Rankin Score: No symptoms Modified Rankin: Severe disability     Balance       Sitting balance - Comments: vitals: pt found wtihout O2 nasal canula on with O2 at 93% wtih respriation around 26-31; during rollnig O2 dropped to 90% and respiration increased to 42, O2 nasal canula donned at 4 L with O2 increase to 96%  Cognition Arousal/Alertness: Lethargic Behavior During Therapy: Flat affect Overall Cognitive Status: Difficult to assess                                 General Comments: pt very tired and asleep upon arrival; pt seemed to follow 1 steps rolling commands and movement of RLE when son translated for pt      Exercises  attempted bridges to scoot up in bed with therapist assisting to hooklying position with LLE, pt able to perform with R, pt unable to elevate buttocks (unsure if unable to too tired)    General Comments        Pertinent Vitals/Pain Faces Pain Scale: Hurts little more Pain Location: grimacing with movement, unable  to locate pain Pain Descriptors / Indicators: Grimacing;Sore    Home Living                      Prior Function            PT Goals (current goals can now be found in the care plan section) Acute Rehab PT Goals Patient Stated Goal: to improve PT Goal Formulation: With patient Time For Goal Achievement: 10/06/2020 Potential to Achieve Goals: Fair Progress towards PT goals: Not progressing toward goals - comment (pt with limited participation in session duet o fatigue)    Frequency    Min 4X/week      PT Plan Current plan remains appropriate    Co-evaluation              AM-PAC PT "6 Clicks" Mobility   Outcome Measure  Help needed turning from your back to your side while in a flat bed without using bedrails?: A Lot Help needed moving from lying on your back to sitting on the side of a flat bed without using bedrails?: Total Help needed moving to and from a bed to a chair (including a wheelchair)?: Total Help needed standing up from a chair using your arms (e.g., wheelchair or bedside chair)?: Total Help needed to walk in hospital room?: Total Help needed climbing 3-5 steps with a railing? : Total 6 Click Score: 7    End of Session Equipment Utilized During Treatment: Oxygen Activity Tolerance: Patient limited by fatigue Patient left: with call bell/phone within reach;in bed;with family/visitor present;with bed alarm set Nurse Communication: Mobility status PT Visit Diagnosis: Other abnormalities of gait and mobility (R26.89);Muscle weakness (generalized) (M62.81);Hemiplegia and hemiparesis;Unsteadiness on feet (R26.81);Difficulty in walking, not elsewhere classified (R26.2);Other symptoms and signs involving the nervous system (R29.898) Hemiplegia - Right/Left: Left Hemiplegia - dominant/non-dominant: Non-dominant Hemiplegia - caused by: Cerebral infarction     Time: 1151-1209 PT Time Calculation (min) (ACUTE ONLY): 18 min  Charges:  $Therapeutic  Activity: 8-22 mins                     Ginette Otto, DPT Acute Rehabilitation Services [phone]   Lucretia Field 10/16/2020, 1:05 PM

## 2020-10-17 ENCOUNTER — Inpatient Hospital Stay: Payer: Self-pay

## 2020-10-17 DIAGNOSIS — I10 Essential (primary) hypertension: Secondary | ICD-10-CM | POA: Diagnosis not present

## 2020-10-17 DIAGNOSIS — N179 Acute kidney failure, unspecified: Secondary | ICD-10-CM | POA: Diagnosis not present

## 2020-10-17 DIAGNOSIS — I6601 Occlusion and stenosis of right middle cerebral artery: Secondary | ICD-10-CM | POA: Diagnosis not present

## 2020-10-17 DIAGNOSIS — I63131 Cerebral infarction due to embolism of right carotid artery: Secondary | ICD-10-CM | POA: Diagnosis not present

## 2020-10-17 LAB — GLUCOSE, CAPILLARY
Glucose-Capillary: 107 mg/dL — ABNORMAL HIGH (ref 70–99)
Glucose-Capillary: 140 mg/dL — ABNORMAL HIGH (ref 70–99)
Glucose-Capillary: 147 mg/dL — ABNORMAL HIGH (ref 70–99)
Glucose-Capillary: 150 mg/dL — ABNORMAL HIGH (ref 70–99)
Glucose-Capillary: 160 mg/dL — ABNORMAL HIGH (ref 70–99)
Glucose-Capillary: 189 mg/dL — ABNORMAL HIGH (ref 70–99)

## 2020-10-17 LAB — BASIC METABOLIC PANEL
Anion gap: 9 (ref 5–15)
BUN: 33 mg/dL — ABNORMAL HIGH (ref 8–23)
CO2: 19 mmol/L — ABNORMAL LOW (ref 22–32)
Calcium: 7.4 mg/dL — ABNORMAL LOW (ref 8.9–10.3)
Chloride: 119 mmol/L — ABNORMAL HIGH (ref 98–111)
Creatinine, Ser: 1.1 mg/dL (ref 0.61–1.24)
GFR, Estimated: >60 mL/min (ref >60)
Glucose, Bld: 158 mg/dL — ABNORMAL HIGH (ref 70–99)
Potassium: 3.1 mmol/L — ABNORMAL LOW (ref 3.5–5.1)
Sodium: 147 mmol/L — ABNORMAL HIGH (ref 135–145)

## 2020-10-17 LAB — TRIGLYCERIDES: Triglycerides: 260 mg/dL — ABNORMAL HIGH (ref <150)

## 2020-10-17 LAB — CBC
HCT: 29.9 % — ABNORMAL LOW (ref 39.0–52.0)
Hemoglobin: 10.1 g/dL — ABNORMAL LOW (ref 13.0–17.0)
MCH: 29.4 pg (ref 26.0–34.0)
MCHC: 33.8 g/dL (ref 30.0–36.0)
MCV: 87.2 fL (ref 80.0–100.0)
Platelets: 243 10*3/uL (ref 150–400)
RBC: 3.43 MIL/uL — ABNORMAL LOW (ref 4.22–5.81)
RDW: 15.1 % (ref 11.5–15.5)
WBC: 9.4 10*3/uL (ref 4.0–10.5)
nRBC: 0.2 % (ref 0.0–0.2)

## 2020-10-17 LAB — PHOSPHORUS: Phosphorus: 4 mg/dL (ref 2.5–4.6)

## 2020-10-17 LAB — MAGNESIUM: Magnesium: 2.2 mg/dL (ref 1.7–2.4)

## 2020-10-17 LAB — PREALBUMIN: Prealbumin: 9.4 mg/dL — ABNORMAL LOW (ref 18–38)

## 2020-10-17 MED ORDER — SODIUM CHLORIDE 0.9% FLUSH
10.0000 mL | Freq: Two times a day (BID) | INTRAVENOUS | Status: DC
  Administered 2020-10-17 – 2020-11-04 (×27): 10 mL

## 2020-10-17 MED ORDER — METOPROLOL TARTRATE 5 MG/5ML IV SOLN
5.0000 mg | INTRAVENOUS | Status: DC | PRN
  Administered 2020-10-18 – 2020-10-20 (×5): 5 mg via INTRAVENOUS
  Filled 2020-10-17 (×5): qty 5

## 2020-10-17 MED ORDER — DEXTROSE-NACL 5-0.2 % IV SOLN: INTRAVENOUS | Status: DC

## 2020-10-17 MED ORDER — MORPHINE SULFATE (PF) 2 MG/ML IV SOLN
1.0000 mg | INTRAVENOUS | Status: DC | PRN
  Administered 2020-10-17: 1 mg via INTRAVENOUS
  Filled 2020-10-17 (×2): qty 1

## 2020-10-17 MED ORDER — POTASSIUM CHLORIDE 10 MEQ/100ML IV SOLN
10.0000 meq | INTRAVENOUS | Status: AC
  Administered 2020-10-17 (×5): 10 meq via INTRAVENOUS
  Filled 2020-10-17 (×5): qty 100

## 2020-10-17 MED ORDER — SODIUM CHLORIDE 0.9 % IV SOLN
0.7500 ug/kg/min | INTRAVENOUS | Status: DC
  Administered 2020-10-17: 0.75 ug/kg/min via INTRAVENOUS
  Filled 2020-10-17: qty 50

## 2020-10-17 MED ORDER — SODIUM CHLORIDE 0.9% FLUSH
10.0000 mL | INTRAVENOUS | Status: DC | PRN
  Administered 2020-10-18 – 2020-10-24 (×2): 10 mL

## 2020-10-17 MED ORDER — TRAVASOL 10 % IV SOLN
INTRAVENOUS | Status: AC
  Filled 2020-10-17: qty 547.2

## 2020-10-17 MED ORDER — SODIUM CHLORIDE 0.9 % IV SOLN
3.0000 g | Freq: Four times a day (QID) | INTRAVENOUS | Status: DC
  Administered 2020-10-17 – 2020-10-18 (×6): 3 g via INTRAVENOUS
  Filled 2020-10-17: qty 3
  Filled 2020-10-17 (×4): qty 8
  Filled 2020-10-17 (×2): qty 3
  Filled 2020-10-17: qty 8
  Filled 2020-10-17: qty 3

## 2020-10-17 MED ORDER — SODIUM CHLORIDE 0.9 % IV SOLN
2.0000 ug/kg/min | INTRAVENOUS | Status: DC
  Filled 2020-10-17: qty 50

## 2020-10-17 MED ORDER — ASPIRIN 300 MG RE SUPP
300.0000 mg | Freq: Every day | RECTAL | Status: DC
  Administered 2020-10-17 – 2020-10-29 (×11): 300 mg via RECTAL
  Filled 2020-10-17 (×12): qty 1

## 2020-10-17 MED ORDER — FLUCONAZOLE IN SODIUM CHLORIDE 400-0.9 MG/200ML-% IV SOLN
400.0000 mg | INTRAVENOUS | Status: DC
  Administered 2020-10-17 – 2020-10-21 (×5): 400 mg via INTRAVENOUS
  Filled 2020-10-17 (×6): qty 200

## 2020-10-17 MED ORDER — MORPHINE SULFATE (PF) 2 MG/ML IV SOLN
2.0000 mg | Freq: Once | INTRAVENOUS | Status: AC
  Administered 2020-10-17: 2 mg via INTRAVENOUS

## 2020-10-17 MED ORDER — MORPHINE SULFATE (PF) 2 MG/ML IV SOLN
2.0000 mg | INTRAVENOUS | Status: DC | PRN
  Administered 2020-10-17 – 2020-10-18 (×3): 2 mg via INTRAVENOUS
  Filled 2020-10-17 (×4): qty 1

## 2020-10-17 NOTE — Progress Notes (Addendum)
SLP Cancellation Note  Patient Details Name: Mark May MRN: [id number] DOB: 1956-05-23   Cancelled treatment:       Reason Eval/Treat Not Completed: Medical issues which prohibited therapy.NPO with TPN and in a sterile procedure. Wanted to point out that pt does have potential for oral means of nutrition. Swallow was only moderately impaired, but pt because septic, lethargic and swallowing declined. When mentation improves and pt is medically ready for oral intake, SLP can resume trials and assessment for oral means of nutrition. Will f/u   Harlon Ditty, MA CCC-SLP  Acute Rehabilitation Services Pager [phone] Office [phone]  Claudine Mouton 10/17/2020, 11:09 AM

## 2020-10-17 NOTE — Progress Notes (Signed)
Referring Physician(s): Lorin GlassSmith, Daniel C (CCM)  Supervising Physician: Oley BalmHassell, Daniel  [facility]  [facility]  Chief Complaint: None- lethargic  Subjective:  Right retroperitoneal/perinephric abscess s/p right retroperitoneal drain placement in IR 10/14/2020. Patient laying in bed resting comfortably. Appears lethargic- briefly opens eyes to voice and quickly falls back asleep. Son at bedside and provided translation today. Right retroperitoneal drain site c/d/i. CCS has been consulted for possible surgical intervention per son/notes.   Allergies: Patient has no known allergies.  Medications: Prior to Admission medications   Medication Sig Start Date End Date Taking? Authorizing Provider  allopurinol (ZYLOPRIM) 100 MG tablet Take 3 tablets (300 mg total) by mouth daily. Patient not taking: Reported on 10/09/2020 02/17/18   Reubin MilanBerglund, Laura H, MD  amLODipine (NORVASC) 10 MG tablet Take 1 tablet (10 mg total) by mouth daily. Patient not taking: Reported on 10/09/2020 02/17/18   Reubin MilanBerglund, Laura H, MD  tamsulosin [facility](FLOMAX) 0.4 MG CAPS capsule Take 1 capsule (0.4 mg total) by mouth daily after supper. Patient not taking: Reported on 10/09/2020 03/09/18   Reubin MilanBerglund, Laura H, MD     Vital Signs: BP 140/67 (BP Location: Right Arm)   Pulse 76   Temp 98.6 F (37 C) (Oral)   Resp (!) 24   Wt 130 lb 4.7 oz (59.1 kg)   SpO2 98%   BMI 20.41 kg/m   Physical Exam Vitals and nursing note reviewed.  Constitutional:      General: He is not in acute distress.    Comments: Lethargic but arousable.  Pulmonary:     Effort: Pulmonary effort is normal. No respiratory distress.  Abdominal:     Comments: Right retroperitoneal drain site without tenderness, erythema, drainage, or active bleeding; approximately 10 cc thick brown fluid in suction bulb.   Skin:    General: Skin is warm and dry.  Neurological:     Comments: Lethargic but arousable.     Imaging: CT ABDOMEN PELVIS WO  CONTRAST  Result Date: 10/16/2020 CLINICAL DATA:  Peritonitis or perforation suspected. EXAM: CT ABDOMEN AND PELVIS WITHOUT CONTRAST TECHNIQUE: Multidetector CT imaging of the abdomen and pelvis was performed following the standard protocol without IV contrast. COMPARISON:  10/13/2020 FINDINGS: Lower chest: Moderate bilateral pleural effusions with bilateral lower lobe atelectasis or consolidation, similar to prior study. Heart is normal size. Hepatobiliary: No focal hepatic abnormality. Gallbladder contracted and filled with stones. Pancreas: No focal abnormality or ductal dilatation. Spleen: No focal abnormality.  Normal size. Adrenals/Urinary Tract: Gas seen within the urinary bladder, stable since prior study. Several small nonobstructing right renal stones. No hydronephrosis or ureteral stones. Adrenal glands unremarkable. Bilateral exophytic renal cysts noted. There is a drainage catheter now in place within the previously seen perinephric gas and fluid collection. The previously seen fluid collection has decreased in overall size. Gas and contrast material is seen within the perinephric space. Contrast also appears to extend into the right paracolic gutter. This is concerning for possible communication with the right colon. Stomach/Bowel: Mild wall thickening and irregularity along the posterior wall of the ascending colon adjacent to the contrast seen in the right perinephric space and paracolic gutter. No evidence of bowel obstruction. Stomach and small bowel decompressed. NG tube tip is in the stomach. Vascular/Lymphatic: Aortic atherosclerosis. No evidence of aneurysm or adenopathy. Reproductive: No visible focal abnormality. Other: Small to moderate free fluid in the pelvis. Musculoskeletal: No acute bony abnormality. IMPRESSION: Interval placement of drainage catheter into the right perinephric space in  the area of previously seen gas and fluid collection. Fluid collection has decreased since prior  study. There is now contrast material seen within the perinephric space and extending into the right paracolic gutter. This is concerning for possible fistulous communication to the colon. May consider contrast injection through the drainage catheter under fluoroscopic guidance to assess for enteric fistula. Moderate bilateral pleural effusions with bibasilar atelectasis or consolidation, unchanged. Cholelithiasis. NG tube in the stomach. Aortic atherosclerosis. Small to moderate free fluid in the pelvis. Electronically Signed   By: Charlett Nose M.D.   On: 10/16/2020 17:21   CT ABDOMEN PELVIS WO CONTRAST  Result Date: 10/13/2020 CLINICAL DATA:  64 year old male with abdominal abscess or infection. EXAM: CT ABDOMEN AND PELVIS WITHOUT CONTRAST TECHNIQUE: Multidetector CT imaging of the abdomen and pelvis was performed following the standard protocol without IV contrast. COMPARISON:  Renal ultrasound dated 10/11/2020. FINDINGS: Evaluation of this exam is limited in the absence of intravenous contrast. Lower chest: Partially visualized small bilateral pleural effusions with complete consolidative changes of the visualized lower lobes. No intra-abdominal free air. There is a small free fluid in the pelvis. Hepatobiliary: The liver is unremarkable. No intrahepatic biliary dilatation. There are multiple stones in the gallbladder. No pericholecystic fluid or evidence of acute cholecystitis by CT. Pancreas: Unremarkable. No pancreatic ductal dilatation or surrounding inflammatory changes. Spleen: Normal in size without focal abnormality. Adrenals/Urinary Tract: The adrenal glands unremarkable. Multiple nonobstructing right renal calculi measure up to 4 mm in the inferior pole of the right kidney. No hydronephrosis. There is no hydronephrosis or nephrolithiasis on the left. Multiple bilateral renal hypodense lesions are not characterized on this noncontrast CT. There is fluid collection with scattered pockets of air in  the right perinephric space and along the right psoas muscle extending down into the pelvis. The fluid collection measures 7.4 x 4.3 cm in greatest axial dimensions and 17 cm in craniocaudal length. There is air within the urinary bladder, possibly related to recent instrumentation. An infectious process is not excluded. Stomach/Bowel: A feeding tube is noted with tip appearing in the distal stomach. Evaluation however is limited due to respiratory motion. Contrast is noted in the colon. There is distal colonic diverticulosis without active inflammatory changes. No bowel obstruction. The appendix is not visualized with certainty. Vascular/Lymphatic: Moderate aortoiliac atherosclerotic disease. The IVC is unremarkable. No portal venous gas. There is no adenopathy. Reproductive: The prostate and seminal vesicles are grossly unremarkable. Other: There is mild diffuse subcutaneous edema. No fluid collection. Musculoskeletal: No acute or significant osseous findings. IMPRESSION: 1. Large right perinephric fluid collection and pockets of air concerning for an infectious process/abscess. Clinical correlation is recommended. 2. Cholelithiasis. 3. Nonobstructing right renal calculi. No hydronephrosis. 4. Colonic diverticulosis. No bowel obstruction. 5. Partially visualized small bilateral pleural effusions with complete consolidative changes of the visualized lower lobes. 6. Aortic Atherosclerosis (ICD10-I70.0). Electronically Signed   By: Elgie Collard M.D.   On: 10/13/2020 16:42   DG Chest 1 View  Result Date: 10/16/2020 CLINICAL DATA:  Tachycardia EXAM: CHEST  1 VIEW COMPARISON:  10/13/2020 FINDINGS: Unchanged left basilar opacities. Hazy diffuse opacities are also unchanged. Esophageal catheter courses below the field of view. Cardiac silhouette size is normal. IMPRESSION: Unchanged left basilar opacities and hazy diffuse opacities bilaterally. Electronically Signed   By: Deatra Robinson M.D.   On: 10/16/2020 01:15    DG Abd 1 View  Result Date: 10/15/2020 CLINICAL DATA:  Abdominal pain EXAM: ABDOMEN - 1 VIEW COMPARISON:  Abdomen radiograph  October 13, 2020 and CT abdomen and pelvis October 13, 2020 FINDINGS: There is a drain in the right lower quadrant. Feeding tube tip is in the distal stomach region. There are foci of contrast in colon and appendix. There is no bowel dilatation or air-fluid level to suggest bowel obstruction. No free air is appreciable on supine examination. IMPRESSION: No bowel obstruction. No free air evident on supine examination. Tube and catheter positions as noted. Electronically Signed   By: Bretta Bang III M.D.   On: 10/15/2020 13:07   DG Abd 1 View  Result Date: 10/13/2020 CLINICAL DATA:  Abdominal distension EXAM: ABDOMEN - 1 VIEW COMPARISON:  October 10, 2020 FINDINGS: Flocculated enteric contrast is seen within the colon. Moderate predominately RIGHT-sided colonic stool burden. Gaseous distension of loops of colon without dilation. Feeding tube tip terminates over the distal stomach. Probable nephrolithiasis. Visualized osseous structures are unremarkable. IMPRESSION: Feeding tube tip terminates over the distal stomach. Electronically Signed   By: Meda Klinefelter MD   On: 10/13/2020 14:30   DG Abd Portable 1V  Result Date: 10/16/2020 CLINICAL DATA:  Nasogastric tube placement EXAM: PORTABLE ABDOMEN - 1 VIEW COMPARISON:  One day prior FINDINGS: Removal of feeding tube with placement of nasogastric tube. This terminates at the gastric body. Percutaneous drain projects over the right upper pelvis. Gas-filled small bowel loops at up to 2.9 cm throughout the left side of the abdomen. Minimal contrast within the ascending colon. IMPRESSION: Nasogastric tube terminating at the gastric body. Gas-filled upper normal sized small bowel loops could mild adynamic ileus or less likely low-grade partial small bowel obstruction. Electronically Signed   By: Jeronimo Greaves M.D.   On:  10/16/2020 13:29   CT IMAGE GUIDED DRAINAGE BY PERCUTANEOUS CATHETER  Result Date: 10/14/2020 CLINICAL DATA:  Large retroperitoneal abscess centered primarily inferior to the right kidney. EXAM: CT GUIDED CATHETER DRAINAGE OF RIGHT RETROPERITONEAL ABSCESS ANESTHESIA/SEDATION: 2.0 mg IV Versed 100 mcg IV Fentanyl Total Moderate Sedation Time:  12 minutes The patient's level of consciousness and physiologic status were continuously monitored during the procedure by Radiology nursing. PROCEDURE: The procedure, risks, benefits, and alternatives were explained to the patient. Questions regarding the procedure were encouraged and answered. The patient understands and consents to the procedure. A foreign language interpreter was utilized for consent. A time out was performed prior to initiating the procedure. CT was performed through the abdomen in a prone position. The right translumbar region was prepped with chlorhexidine in a sterile fashion, and a sterile drape was applied covering the operative field. A sterile gown and sterile gloves were used for the procedure. Local anesthesia was provided with 1% Lidocaine. Under CT guidance, an 18 gauge trocar needle was advanced into the right retroperitoneal space. After return of fluid, a guidewire was advanced. The tract was dilated and a 12 French percutaneous drainage catheter placed. Additional fluid sample was aspirated from the drain and sent for culture analysis. Catheter position was confirmed by CT. The catheter was attached to suction bulb drainage. It was secured at the skin with a Prolene retention suture and StatLock device. COMPLICATIONS: None FINDINGS: The retroperitoneal collection yielded reddish brown fluid. After drain placement there is copious return of fluid. IMPRESSION: CT-guided percutaneous catheter drainage of right retroperitoneal abscess yielding reddish brown fluid. A fluid sample was sent for culture analysis. A 12 French drainage catheter  was placed and attached to suction bulb drainage. Electronically Signed   By: Irish Lack M.D.   On: 10/14/2020 12:47  [place] EKG SITE RITE  Result Date: 10/17/2020 If Site Rite image not attached, placement could not be confirmed due to current cardiac rhythm.   Labs:  CBC: Recent Labs    10/14/20 0116 10/14/20 0116 10/15/20 0408 10/15/20 2158 10/16/20 0508 10/17/20 0920  WBC 9.7  --  11.5*  --  12.2* 9.4  HGB 10.7*   < > 10.4* 11.4* 10.4* 10.1*  HCT 30.1*   < > 29.6* 32.1* 29.2* 29.9*  PLT 197  --  254  --  263 243   < > = values in this interval not displayed.    COAGS: Recent Labs    2020/10/08 1246 10/11/20 1706  INR 0.9 1.1  APTT 25 41*    BMP: Recent Labs    10/14/20 0116 10/15/20 0408 10/16/20 0508 10/17/20 0920  NA 145 149* 152* 147*  K 3.5 3.3* 4.3 3.1*  CL 113* 116* 124* 119*  CO2 22 20* 20* 19*  GLUCOSE 151* 148* 144* 158*  BUN 42* 45* 46* 33*  CALCIUM 7.5* 7.5* 7.4* 7.4*  CREATININE 1.35* 1.40* 1.32* 1.10  GFRNONAA 58* 56* 60* >60    LIVER FUNCTION TESTS: Recent Labs    10/08/20 0727 10/11/20 1706 10/13/20 1009 10/15/20 0408  BILITOT 2.4* 1.3* 0.8 1.3*  AST 53* 101* 105* 134*  ALT 38 45* 60* 70*  ALKPHOS 121 96 163* 200*  PROT 5.9* 5.3* 5.0* 5.1*  ALBUMIN 2.6* 1.6* 1.4* 1.3*    Assessment and Plan:  Right retroperitoneal/perinephric abscess s/p right retroperitoneal drain placement in IR 10/14/2020. Right retroperitoneal drain site stable with approximately 10 cc thick brown fluid in suction bulb (additional 320 cc output from drain in past 24 hours). Continue current drain management- continue with Qshift flushes/monitor of output. Plan for repeat CT when output <10 cc/day (assess for possible removal). Further plans per TR[facility]/CCM/neurology/[facility]S- appreciate and agree with management. IR to follow.   Electronically Signed: Elwin Mocha, PA-C 10/17/2020, 10:21 AM   I spent a total of 15 Minutes at the the patient's bedside  AND on the patient's hospital floor or unit, greater than 50% of which was counseling/coordinating care for right retroperitoneal abscess s/p right retroperitoneal drain placement.

## 2020-10-17 NOTE — Progress Notes (Signed)
Pharmacy Antibiotic Note  Mark May is a 64 y.o. 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 Pharmacy has been consulted to dose Vancomycin + Zosyn for R-perinephric abscess, cellulitis, and concern for aspiration PNA.  The patient has received 7 days of empiric Vancomycin/Zosyn - discussed with surgery and TRH and now plan to narrow to Unasyn + Fluconazole to cover for the abscess culture growing Klebsiella PNA and Candida Albicans.   AKI continues to resolve with SCr down to 1.1, CrCl~50-60 ml/min.   Plan: - D/c Vanc/Zosyn - Start Unasyn 3g IV every 6 hours - Start Fluconazole 400 mg IV every 24 hours - Will continue to follow renal function, culture results, LOT, and antibiotic de-escalation plans   Weight: 62.9 kg (138 lb 10.7 oz)  Temp (24hrs), Avg:98.5 F (36.9 C), Min:98.2 F (36.8 C), Max:99.1 F (37.3 C)  Recent Labs  Lab 10/11/20 1906 10/12/20 0314 10/12/20 0453 10/13/20 1009 10/14/20 0116 10/15/20 0408 10/16/20 0508 10/17/20 0920  WBC  --    < >  --  9.4 9.7 11.5* 12.2* 9.4  CREATININE  --    < >  --  1.40* 1.35* 1.40* 1.32* 1.10  LATICACIDVEN 3.0*  --  1.6 1.8  --   --   --   --    < > = values in this interval not displayed.    Estimated Creatinine Clearance: 59.6 mL/min (by C-G formula based on SCr of 1.1 mg/dL).    No Known Allergies  Antimicrobials this admission: Cefepime 11/11 x 1 Flagyl 11/11 x 1 Zosyn 11/11>> 11/17 Vanco 11/11>>11/11, 11/13 >> 11/17 Unasyn 11/17 >> Fluconazole 11/17 >>  Dose adjustments this admission:  Microbiology results: 11/11 BCx x2:  ngtd 11/10 UCx;  negative 11/13 BCx >> ngtd 11/14 R-retroperitoneal abscess >> mod GPC in pairs on GS >>  Thank you for allowing pharmacy to be a part of this patient's care.  Georgina Pillion, PharmD, BCPS Clinical Pharmacist Clinical phone for 10/17/2020: [phone] 10/17/2020 11:52 AM   **Pharmacist phone directory can now be found on [email or web address] ([email or web address]).  Listed under [facility].

## 2020-10-17 NOTE — Progress Notes (Addendum)
STROKE TEAM PROGRESS NOTE   INTERVAL HISTORY Son and RN are at bedside. Per son, pt was with PT/OT sitting in bedside commod and then moved to chair, started to have epigastric pain more on the left side. Received morphine IV. Surgery on board, no surgical indication. Continue bowel rest with TPN.    OBJECTIVE Vitals:   10/17/20 0808 10/17/20 1127 10/17/20 1300 10/17/20 1639  BP: 140/67   (!) 159/87  Pulse: 76  92 85  Resp: (!) 24  (!) 24 20  Temp: 98.6 F (37 C)  98.3 F (36.8 C) 98.7 F (37.1 C)  TempSrc: Oral  Oral Oral  SpO2: 98%  100% 97%  Weight:  62.9 kg     CBC:  Recent Labs  Lab 10/11/20 1706 10/12/20 0314 10/16/20 0508 10/17/20 0920  WBC 5.4   < > 12.2* 9.4  NEUTROABS 4.8  --   --   --   HGB 12.4*   < > 10.4* 10.1*  HCT 36.7*   < > 29.2* 29.9*  MCV 87.0   < > 83.4 87.2  PLT 218   < > 263 243   < > = values in this interval not displayed.   Basic Metabolic Panel:  Recent Labs  Lab 10/16/20 0508 10/16/20 0508 10/16/20 1652 10/17/20 0920  NA 152*  --   --  147*  K 4.3  --   --  3.1*  CL 124*  --   --  119*  CO2 20*  --   --  19*  GLUCOSE 144*  --   --  158*  BUN 46*  --   --  33*  CREATININE 1.32*  --   --  1.10  CALCIUM 7.4*  --   --  7.4*  MG 2.4   < > 2.4 2.2  PHOS 2.9   < > 3.9 4.0   < > = values in this interval not displayed.   Lipid Panel:     Component Value Date/Time   CHOL 173 10/06/2020 0522   CHOL 216 (H) 03/09/2018 1620   TRIG 260 (H) 10/17/2020 0920   HDL 33 (L) 10/06/2020 0522   HDL 59 03/09/2018 1620   CHOLHDL 5.2 10/06/2020 0522   VLDL 55 (H) 10/06/2020 0522   LDLCALC 85 10/06/2020 0522   LDLCALC 136 (H) 03/09/2018 1620   HgbA1c:  Lab Results  Component Value Date   HGBA1C 5.8 (H) 10/06/2020   Urine Drug Screen: No results found for: LABOPIA, COCAINSCRNUR, LABBENZ, AMPHETMU, THCU, LABBARB  Alcohol Level No results found for: [facility]  IMAGING  CT HEAD WO CONTRAST 10/29/2020 1. No acute abnormality.  2. Minimal diffuse  cerebral and cerebellar atrophy.  3. Mild chronic small vessel white matter ischemic changes in both cerebral hemispheres.   CT Angio Head W or Wo Contrast CT Angio Neck W and/or Wo Contrast CT CEREBRAL PERFUSION W CONTRAST 10/13/2020 1. 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  2. Right MCA infarct with extensive penumbra.  3. 4 mm right supraclinoid ICA aneurysm.  4. Widely patent cervical carotid and vertebral arteries.  5. Aortic Atherosclerosis (ICD10-I70.0).   CT HEAD WO CONTRAST 10/17/2020 Hypodensity in the right lateral temporal lobe now shows mild hemorrhage or contrast enhancement. This is most consistent with acute infarct. There has been interval stenting of the right middle cerebral artery.   CT HEAD WO CONTRAST 10/28/2020 Right temporal lobe hypodensity compatible with acute infarct. Decreased hyperdensity within this stroke compared with  4 hours previously compatible with contrast staining rather than hemorrhage. Small area of acute infarct in the right parietal lobe now evident. Right MCA stent.   MRI brain: 10/06/20 1. Fairly extensive patchy acute/early subacute infarcts within the right MCA/watershed territory.  2. Additional small acute/early subacute infarcts within the dorsal right thalamus and left basal ganglia. 3. Background moderate chronic small vessel ischemic disease.  MRA head: 10/06/20 1. Interval stenting of the M1/M2 right middle cerebral artery. Flow related signal is present proximal and distal to the stent suggestive of stent patency. 2. 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  DG Abd 1 View 10/12/2020 Gastric catheter within the stomach kinked at the proximal side port. This could be withdrawn slightly S necessary.   Transthoracic Echocardiogram  10/06/2020 1. Left ventricular ejection fraction, by estimation, is 70 to 75%. The left ventricle has hyperdynamic function. The left ventricle has  no regional wall motion abnormalities. Left ventricular diastolic parameters are consistent with Grade II diastolic dysfunction (pseudonormalization). Elevated left atrial pressure.  2. Right ventricular systolic function is normal. The right ventricular size is normal.  3. The mitral valve is normal in structure. Mild mitral valve regurgitation. No evidence of mitral stenosis.  4. The aortic valve is normal in structure. Aortic valve regurgitation is not visualized. No aortic stenosis is present.  5. The inferior vena cava is dilated in size with <50% respiratory variability, suggesting right atrial pressure of 15 mmHg.  ECG - SR rate 70 BPM. (See cardiology reading for complete details)  [place] EKG SITE RITE  Result Date: 10/17/2020 If Site Rite image not attached, placement could not be confirmed due to current cardiac rhythm.   PHYSICAL EXAM    Temp:  [98.2 F (36.8 C)-99.1 F (37.3 C)] 98.7 F (37.1 C) (11/17 1639) Pulse Rate:  [70-92] 85 (11/17 1639) Resp:  [20-25] 20 (11/17 1639) BP: (126-159)/(61-87) 159/87 (11/17 1639) SpO2:  [96 %-100 %] 97 % (11/17 1639) Weight:  [59.1 kg-62.9 kg] 62.9 kg (11/17 1127)  General - malnourished, well developed, in acute distress with epigastric pain.  Ophthalmologic - fundi not visualized due to noncooperation.  Cardiovascular - Regular rhythm and rate.  Abdominal - soft, tenderness on palpation at epigastric region, drainage tube in place continues to drainge.   Neuro -  lethargic, but still orientated to self, age, time and people and place. Severe dysarthria, no aphasia but paucity of speech, able to name and repeat, follows simple commands. Tracking bilaterally, seems to have intact visual field. PERRL, no gaze deviation or palsy. Left facial droop. Tongue midline. Left UE and LE flaccid. RUE at least 4/5 and RLE at least 3/5. Sensation symmetrical, FTN intact on the right and gait not tested.   ASSESSMENT/PLAN Mr. Aamari Strawderman is a  64 y.o. male with history of hypertension, BPH, sciatica, stomach ulcer status post surgery in the past presented to ER at [facility] for acute onset left-sided weakness, left facial droop and left hemianopia.  CT head and neck showed right M1 high-grade stenosis versus near occlusive thrombosis.  CT perfusion showed large penumbra.  Patient transferred to [facility] for thrombectomy.  He did not receive IV t-PA due to late presentation (>4.5 hours from time of onset). IR - complete occlusion of RT MCA M 1 seg - thrombectomy ->TICI 3 revascularization with placement of rescue stent m4 mm x 24 mm Atlas for reocclusion with restoration of TICI 3 .  Stroke: R MCA  infarct s/p IR TICI3 R M1 w/ stent placement - likely embolic due to new diagnosed afib  CT Head - No acute abnormality. Minimal diffuse cerebral and cerebellar atrophy.    CTA H&N - Severe distal right M1 stenosis or subocclusive thrombus. 4 mm right supraclinoid ICA aneurysm. Widely patent cervical carotid and vertebral arteries.   CTP - Right MCA infarct with extensive penumbra.   MRI head - Fairly extensive patchy acute/early subacute infarcts within the right MCA/watershed territory. Additional small acute/early subacute infarcts within the dorsal right thalamus and left basal ganglia.  MRA head -  Interval stenting of the M1/M2 right middle cerebral artery. Flow related signal is present proximal and distal to the stent suggestive of stent patency. 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  2D Echo - EF 70 - 75%. No cardiac source of emboli identified.   UE doppler neg DVT  Loyal Jacobson Virus 2 - negative  LDL - 85  HgbA1c - 5.8  VTE prophylaxis - SCDs  No antithrombotic prior to admission, now on ASA 300 PR. Brilinta discontinued for bowel rest, heparin IV discontinued due to GIB.    Therapy recommendations:  CIR  Disposition:  Pending  Atrial Fibrillation w/ RVR, new  diagnosis 11/11  CHA2DS2-VASc Score = at least 5,  ?2 oral anticoagulation recommended  Age in Years:  18-74   +1    Sex:  Male   0    Hypertension History:  yes   +1     Diabetes Mellitus:  0  Congestive Heart Failure History:  yes   +1   Vascular Disease History:  0     Stroke/TIA/Thromboembolism History:  yes   +2 . amio gtt -> amio po -> d/c for bowel rest  . On IV heparin re-started 11/14 -> stopped 11/15 due to GIB . 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 BP goal <180 . Stopped lisinopril d/t rising Cre . Long-term BP goal normotensive  Hyperlipidemia  Home Lipid lowering medication: none  LDL 85, goal < 70  Off lipitor now due to elevated CK level and increasing LFTs and bowel rest  CK 991->630  ALT/AST - 101/45->105/60->134/70  Dysphagia . Secondary to stroke . on IVF @ 35 . On TPN @ 40 . Speech on board    Other Stroke Risk Factors  Advanced age  Other Active Problems  Aortic Atherosclerosis (ICD10-I70.0).   4 mm right supraclinoid ICA aneurysm.  Hypokalemia - 3.1 ->3.5->3.3 ->4.3->3.1  Hx of PUD s/p surgery 30 years ago  Hypernatremia Na 149->152->147  Hospital day # 12   I have discussed with Dr. Pola Corn and Dr. Katrinka Blazing and Tresa Endo PA. I spent  35 minutes in total face-to-face time with the patient, more than 50% of which was spent in counseling and coordination of care, reviewing test results, images and medication, and discussing the diagnosis, treatment plan and potential prognosis. This patient's care requiresreview of multiple databases, neurological assessment, discussion with family, other specialists and medical decision making of high complexity. I had long discussion with son at bedside, updated pt current condition, treatment plan and potential prognosis, and answered all the questions. He expressed understanding and appreciation.    Marvel Plan, MD PhD Stroke Neurology 10/17/2020 7:01 PM    to contact Stroke Continuity provider, please refer to [email or web address]. After hours, contact General Neurology

## 2020-10-17 NOTE — Progress Notes (Signed)
Pt's son concerned about why so many blood draws. Pt's son wishing to have not as frequent blood draws and wish labs to be drawn later in the day. Pt's son educated on the need for certain blood draws. This will be communicated to on-coming nurse.

## 2020-10-17 NOTE — Progress Notes (Signed)
Occupational Therapy Treatment Patient Details Name: Mark May MRN: [id number] DOB: [birth year] Today's Date: 10/17/2020    History of present illness  64 y.o. 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 S/p R RP drain placement on 11/14 by IR.Sepsis due to right lower lobe pneumonia and now retroperitoneal abscess Small right renal stones measuring up to 4 mm on CT A/P 10/13/2020 PMH of hypertension, BPH, sciatica, stomach ulcer status post surgery in the past    OT comments  Pt required 2 skilled therapist to help with positioning from chair to bed due to lines/ leads and fall risk. Pt drifting down in chair and noted to have L lateral lean. Pt reports increased comfort once in the bed. Rn in room addressing any additional needs.    Follow Up Recommendations  CIR;Supervision/Assistance - 24 hour    Equipment Recommendations  Wheelchair (measurements OT);Wheelchair cushion (measurements OT);Hospital bed;3 in 1 bedside commode    Recommendations for Other Services Rehab consult    Precautions / Restrictions Precautions Precautions: Fall Precaution Comments: L side hemiparesis; monitor BP; watch O2, JP drain, NG tube       Mobility Bed Mobility Overal bed mobility: Needs Assistance             General bed mobility comments: total x4 transfer from chair to bed using pad and sheet. pt sliding down in chair and falll risk. son initially declined therapist (A) back to the bed. pt then arousing and showing discomfort. Son appears very distressed by patient and verbalizing "more pain medication" Son educated that pt has had medications  Transfers                 General transfer comment: not appropriate at this time    Balance                                            ADL either performed or assessed with clinical judgement   ADL                                         General ADL Comments: pt currently total (A) for all adls. pt total x4 transfer from chair to bed at this time. pt premedicated with morphine. pt very alert on arrival but by end of session starting to fall asleep again     Vision       Perception     Praxis      Cognition Arousal/Alertness: Lethargic Behavior During Therapy: Flat affect Overall Cognitive Status: Difficult to assess                                 General Comments: pt states "fine" when asked if he was okay pt appears to have reduced discomfort supine in the bed. pt speaking but son not directly translating. son states to RN he says he is dry he needs water        Exercises     Shoulder Instructions       General Comments 6L  [place] and skin tone appears more jaundice in appears than previous sessions     Pertinent Vitals/ Pain       Pain Assessment: Faces Faces Pain Scale: Hurts even more Pain Location: grimacing generalized and resolved with repositioning Pain Descriptors / Indicators: Grimacing;Sore Pain Intervention(s): Monitored during session;Premedicated before session;Repositioned;Other (comment) (RN present and answering questions for son on medications)  Home Living                                          Prior Functioning/Environment              Frequency  Min 2X/week        Progress Toward Goals  OT Goals(current goals can now be found in the care plan section)  Progress towards OT goals: Not progressing toward goals - comment  Acute Rehab OT Goals Patient Stated Goal: to have water per son OT Goal Formulation: Patient unable to participate in goal setting Time For Goal Achievement: 10/23/20 Potential to Achieve Goals: Good ADL Goals Pt Will Perform Grooming: with min guard assist;sitting Pt Will Perform Upper  Body Dressing: with mod assist;sitting Pt Will Transfer to Toilet: with +2 assist;with min assist;bedside commode Additional ADL Goal #1: pt will visually scan and locate 2 objects in L visual field 50% of session Additional ADL Goal #2: Pt will follow 2 step command 75% of attempts  Plan Discharge plan remains appropriate;Frequency remains appropriate    Co-evaluation    PT/OT/SLP Co-Evaluation/Treatment: Yes Reason for Co-Treatment: For patient/therapist safety;To address functional/ADL transfers;Complexity of the patient's impairments (multi-system involvement);Necessary to address cognition/behavior during functional activity   OT goals addressed during session: ADL's and self-care;Proper use of Adaptive equipment and DME;Strengthening/ROM      AM-PAC OT "6 Clicks" Daily Activity     Outcome Measure   Help from another person eating meals?: Total Help from another person taking care of personal grooming?: A Lot Help from another person toileting, which includes using toliet, bedpan, or urinal?: Total Help from another person bathing (including washing, rinsing, drying)?: Total Help from another person to put on and taking off regular upper body clothing?: Total Help from another person to put on and taking off regular lower body clothing?: Total 6 Click Score: 7    End of Session Equipment Utilized During Treatment: Oxygen  OT Visit Diagnosis: Unsteadiness on feet (R26.81);Muscle weakness (generalized) (M62.81);Other symptoms and signs involving cognitive function;Hemiplegia and hemiparesis Hemiplegia - Right/Left: Left Hemiplegia - dominant/non-dominant: Non-Dominant Hemiplegia - caused by: Cerebral infarction   Activity Tolerance Patient limited by lethargy;Patient limited by pain   Patient Left in bed;with call bell/phone within reach;with nursing/sitter in room   Nurse Communication Mobility status        Time: 6812-7517 OT Time Calculation (min): 21  min  Charges: OT General Charges $OT Visit: 1 Visit   Timmothy Euler, OTR/L  Acute Rehabilitation Services Pager: [phone] Office: [phone] .    Mateo Flow 10/17/2020, 4:03 PM

## 2020-10-17 NOTE — Progress Notes (Signed)
PHARMACY - TOTAL PARENTERAL NUTRITION CONSULT NOTE  Indication:  Perforated duodenum  Patient Measurements: Weight: 59.1 kg (130 lb 4.7 oz)   Body mass index is 20.41 kg/m. Usual Weight: 120-125 lbs  Assessment:  7 YOM presented on 10/06/2020 with right MCA occlusion and underwent revascularization.  PMH significant for PUD post partial gastrectomy in 1990.  Patient was started on a dysphagia diet on 10/08/20 and also transferred out of the ICU.  Post-op course complicated by Afib RVR, aspiration PNA/cellulitis and AKI.  He was then started on TF on 10/12/20, which ended on 10/13/20 when CT showed perinephric abscess with gas formation.  Drain was placed and there is concern for fistulous communication to the colon on 10/16/20 CT.  Nutrition has been inadequate since admission and Pharmacy consulted for TPN management.  Per patient's son, patient has no recent weight loss and was eating a balanced diet PTA.  Glucose / Insulin: no hx DM - CBGs well controlled Electrolytes: Na down to 147 with FW and IVF, K 3.1, high CL and low CO2, Phos and Mag high normal Renal: SCr down to 1.1, CK 991 > 630, BUN 33 LFTs / TGs: alk phos 200, AST/ALT 134/70, tbili 1.3 (no jaundice).  TG 396 on 11/7, today's lab is still pending (ILE 15% of total kCal today). Prealbumin / albumin: albumin 1.3 Intake / Output; MIVF: UOP 1.3 ml/kg/hr, drain 361m, BM x4, D51/4NS at 75 ml/hr GI Imaging: none since TPN Surgeries / Procedures: none since TPN  Central access: PICC ordered 10/17/20 TPN start date: 10/17/20  Nutritional Goals (per RD rec on 11/15): 1800-2000 kCal, 100-115gm protein per day  Current Nutrition:  NPO  Plan:  Start TPN at 40 mL/hr at 1800 (goal rate 80 ml/hr) Electrolytes in TPN: no Na, K 5633m/L, Ca 33m13mL, Mag 33mE50m, Phos 10mm19m, max acetate Add standard MVI and trace elements to TPN Continue sensitive SSI Q4H Reduce MIVF D51/4NS to 35 mL/hr at 1800 D/C TF orders KCL x 5 runs Standard TPN  labs and nursing care orders, f/u TG in and may need to adjust TPN formula  Natiya Seelinger D. Jelitza Manninen,Mina MarblermD, BCPS, BCCCPKenedy7/2021, 11:22 AM

## 2020-10-17 NOTE — Progress Notes (Signed)
Physical Therapy Treatment Patient Details Name: Mark May MRN: [id number] DOB: 20-Mar-1956 Today's Date: 10/17/2020    History of Present Illness Pt is 64 year old male with hypertension and hyperlipidemia who presented with left-sided weakness, noted to have right MCA occlusion status post thrombectomy and stent placement 11/5.  Hospitalization complicated by hypoxic respiratory failure secondary to pulmonary edema requiring BiPAP and lasix. On 10/10/2020 he became more lethargic and tachycardic with tachypnea.  Patient went into A. fib with RVR ,also had a fever.  Due to concern for severe sepsis, PCCM was consulted.  Patient found to have sepsis with right lower lobe pneumonia and likely aspiration.  Placement of drain for R retroperitoneal abscess on 11/14, contained bowel perforation.    PT Comments    Pt in chair at arrival and sliding down.  Pt with severe pain and grabbing abdomen.  He was unable to assist with transfer and required total A of 4 to slide back to bed using bed pad.  Once positioned in bed - signs of pain eased and pt falling asleep.  Unable to further participate with therapy today due to pain and lethargy.     Follow Up Recommendations  CIR;Supervision/Assistance - 24 hour     Equipment Recommendations  3in1 (PT);Wheelchair (measurements PT);Wheelchair cushion (measurements PT);Hospital bed    Recommendations for Other Services Rehab consult     Precautions / Restrictions Precautions Precautions: Fall Precaution Comments: L side hemiparesis; monitor BP; watch O2, JP drain, NG tube    Mobility  Bed Mobility Overal bed mobility: Needs Assistance             General bed mobility comments: Pt in chair at arrival and sliding down.  Son initially declined PT assisting pt back to bed due to pt's pain.  However, pt aroused and with signs of pain and sliding down in chair.  Assist of 4 to assist and lines/lead management to slide back to bed using bed pad -  limited transfer due to pain.  Pt initially grabbing abdomen but once positioned in bed he was falling back asleep.  Transfers Overall transfer level: Needs assistance              Lateral/Scoot Transfers: Total assist;+2 physical assistance General transfer comment: not appropriate at this time due to pain and lethargy  Ambulation/Gait                 Stairs             Wheelchair Mobility    Modified Rankin (Stroke Patients Only)       Balance       Sitting balance - Comments: unable today due to pain                                    Cognition Arousal/Alertness: Lethargic Behavior During Therapy: Flat affect Overall Cognitive Status: Difficult to assess                                 General Comments: pt states "fine" when asked if he was okay pt appears to have reduced discomfort supine in the bed. pt speaking but son not directly translating. son states to RN he says he is dry he needs water.  Pt in general with limited communication today - limited by pain and then lethargy once comfortable in bed.  VSS      Exercises      General Comments General comments (skin integrity, edema, etc.):      Pertinent Vitals/Pain Pain Assessment: Faces Faces Pain Scale: Hurts even more Pain Location: grimacing generalized and resolved with repositioning Pain Descriptors / Indicators: Grimacing;Sore Pain Intervention(s): Monitored during session;Repositioned;Premedicated before session    Home Living                      Prior Function            PT Goals (current goals can now be found in the care plan section) Acute Rehab PT Goals Patient Stated Goal: to have water per son PT Goal Formulation: With patient Time For Goal Achievement: 10/20/2020 Potential to Achieve Goals: Fair Progress towards PT goals: Not progressing toward goals - comment (limited by pain)    Frequency    Min 4X/week      PT Plan  Current plan remains appropriate    Co-evaluation   Reason for Co-Treatment: For patient/therapist safety;To address functional/ADL transfers;Complexity of the patient's impairments (multi-system involvement);Necessary to address cognition/behavior during functional activity   OT goals addressed during session: ADL's and self-care;Proper use of Adaptive equipment and DME;Strengthening/ROM      AM-PAC PT "6 Clicks" Mobility   Outcome Measure  Help needed turning from your back to your side while in a flat bed without using bedrails?: Total Help needed moving from lying on your back to sitting on the side of a flat bed without using bedrails?: Total Help needed moving to and from a bed to a chair (including a wheelchair)?: Total Help needed standing up from a chair using your arms (e.g., wheelchair or bedside chair)?: Total Help needed to walk in hospital room?: Total Help needed climbing 3-5 steps with a railing? : Total 6 Click Score: 6    End of Session Equipment Utilized During Treatment: Oxygen Activity Tolerance: Patient limited by pain Patient left: with call bell/phone within reach;in bed;with family/visitor present;with bed alarm set;with nursing/sitter in room Nurse Communication: Mobility status PT Visit Diagnosis: Other abnormalities of gait and mobility (R26.89);Muscle weakness (generalized) (M62.81);Hemiplegia and hemiparesis;Unsteadiness on feet (R26.81);Difficulty in walking, not elsewhere classified (R26.2);Other symptoms and signs involving the nervous system (R29.898) Hemiplegia - Right/Left: Left Hemiplegia - dominant/non-dominant: Non-dominant Hemiplegia - caused by: Cerebral infarction     Time: 2800-3491 PT Time Calculation (min) (ACUTE ONLY): 10 min  Charges:  $Therapeutic Activity: 8-22 mins                     Anise Salvo, PT Acute Rehab Services Pager [phone] [facility] [phone]     Rayetta Humphrey 10/17/2020, 4:31 PM

## 2020-10-17 NOTE — Progress Notes (Signed)
Nutrition Follow-up  DOCUMENTATION CODES:   Severe malnutrition in context of chronic illness  INTERVENTION:  TPN per Pharmacy.   Monitor magnesium, potassium, and phosphorus daily for at least 3 days, MD to replete as needed, as pt is at risk for refeeding syndrome given severe malnutrition and prolonged poor nutrition/TF infusion since admission.  NUTRITION DIAGNOSIS:   Severe Malnutrition related to chronic illness (stomach ulcer s/p partial gastrectomy) as evidenced by moderate fat depletion, severe fat depletion, moderate muscle depletion, severe muscle depletion; ongoing  GOAL:   Patient will meet greater than or equal to 90% of their needs; progressing  MONITOR:   Skin, Weight trends, Labs, I & O's (TPN tolerance)  REASON FOR ASSESSMENT:   Rounds    ASSESSMENT:   Mark May is a 64 y.o. Asian male with PMH of hypertension, BPH, sciatica, stomach ulcer status post surgery in the past presented to ER for acute onset left-sided weakness, left facial droop and left hemianopia.  Pt admitted with rt MCA stroke.   11/5- s/p mechanical thrombectomy of right MCA and stent 11/8- s/p BSE- recommend continued NPO 11/9- s/p BSE- advanced to dysphagia 3 diet with nectar thick liquids 11/12- cortrak tube placed, tip of tube confirmed in stomach 11/14 CT guided drainage of R retroperitoneal abscess, output bilious, growing klebsiella  11/15 TF initiated after GI clearance 11/16 TF stopped  Tube feeding orders have been discontinued. Pt with abdominal pains and distention. 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 NGT in place to LIWS. Pt NPO. Per MD plan for TPN and bowel rest hopeful site of perforation able to heal on own. Possible UGI vs CT next week for evaluation of leak. PICC has been placed. Per Pharmacy note, TPN to start at 40 ml/hr today. TPN goal rate to be 80 ml/hr. Monitor  magnesium, potassium, and phosphorus daily for at least 3 days, MD to replete as needed, as pt is at risk for refeeding syndrome given severe malnutrition and prolonged poor nutrition/TF infusion since admission.  Labs and medications reviewed.   Diet Order:   Diet Order            Diet NPO time specified Except for: Other (See Comments)  Diet effective now                 EDUCATION NEEDS:   Education needs have been addressed  Skin:  Skin Assessment: Reviewed RN Assessment  Last BM:  11/16  Height:   Ht Readings from Last 1 Encounters:  2020/11/02 5\' 7"  (1.702 m)    Weight:   Wt Readings from Last 1 Encounters:  10/17/20 62.9 kg    Ideal Body Weight:  67.3 kg  BMI:  Body mass index is 21.72 kg/m.  Estimated Nutritional Needs:   Kcal:  1800-2000  Protein:  100-115 grams  Fluid:  > 1.8 L  10/19/20, MS, RD, LDN RD pager number/after hours weekend pager number on Amion.

## 2020-10-17 NOTE — Progress Notes (Addendum)
ANTICOAGULATION CONSULT NOTE - Initial Consult  Pharmacy Consult for Cangrelor (now d/ced - see addendum below) Indication: Recent R-MCA stent - unable to take Ticagrelor, prevention of stent re-occlusion  No Known Allergies  Patient Measurements: Weight: 62.9 kg (138 lb 10.7 oz)  Vital Signs: Temp: 98.3 F (36.8 C) (11/17 1300) Temp Source: Oral (11/17 1300) BP: 140/67 (11/17 0808) Pulse Rate: 92 (11/17 1300)  Labs: Recent Labs    10/14/20 2007 10/15/20 0408 10/15/20 0408 10/15/20 0757 10/15/20 1613 10/15/20 2158 10/15/20 2158 10/16/20 0508 10/17/20 0920  HGB  --  10.4*   < >  --   --  11.4*   < > 10.4* 10.1*  HCT  --  29.6*   < >  --   --  32.1*  --  29.2* 29.9*  PLT  --  254  --   --   --   --   --  263 243  HEPARINUNFRC 0.24*  --   --  0.28* 0.30  --   --   --   --   CREATININE  --  1.40*  --   --   --   --   --  1.32* 1.10  CKTOTAL  --  630*  --   --   --   --   --   --   --    < > = values in this interval not displayed.    Estimated Creatinine Clearance: 59.6 mL/min (by C-G formula based on SCr of 1.1 mg/dL).   Medical History: Past Medical History:  Diagnosis Date  . Gout   . Hypertension     Assessment: 41 YOM who presented on 11/5 with severe distal R-M1 stenosis/subocclusive thrombus, s/p thrombectomy + stent 11/5. Post-op, the patient was managed on ASA + ticagrelor. Aspirin was held on 11/11 when Heparin was started for Afib - which is also on hold due to concern for GIB. The patient is now no longer able to use NGT for medication administration, so pharmacy has been consulted to start Cangrelor for bridging to prevent stent thrombosis and re-occlusion until able to take orals or per tube again.   Goal of Therapy:  Various ranges quoted in literature - including PRU 50-150 (immediate post neuro IR); Cardiology bridging studies: 80-280, and <240, <208 Will discuss goal range with neurology (Dr. Roda Shutters) and further clarify   Plan:  - Start Cangrelor at  0.75 mcg/kg/min - discussed rate with CCM - Will check a P2Y12 with AM labs and titrate to goal PRU range once clarified by neurology  Addendum: Upon discussion with neurology (Dr Roda Shutters) to clarify PRU goal, he discussed with IR Corliss Skains) who wanted to just do ASA 300 mg PR for now. Will d/c Cangrelor infusion and associated labs.   Thank you for allowing pharmacy to be a part of this patient's care.  Georgina Pillion, PharmD, BCPS Clinical Pharmacist Clinical phone for 10/17/2020: [phone] 10/17/2020 2:37 PM   **Pharmacist phone directory can now be found on [email or web address] ([email or web address]).  Listed under [facility].

## 2020-10-17 NOTE — Progress Notes (Signed)
PROGRESS NOTE  Mark CastillaLi Fei Phenix  DOB: 03/12/1956  PCP: Reubin MilanBerglund, Laura H, MD ZOX:[id number]  DOA: 10/03/2020  LOS: 12 days   Chief complaint: Left-sided weakness  Brief narrative: Mark May is a 64 y.o. male who [facility] with left-sided weakness. PMH significant for hypertension, hyperlipidemia, history of peptic ulcer disease status post partial gastrectomy in 1990 in [place], gout, BPH, sciatica.  On admission, imagings showed right MCA occlusion, underwent thrombectomy and stent placement on 10/30/2020.   Patient's hospitalization was complicated by hypoxic respiratory failure due to pulmonary edema requiring BiPAP and Lasix.  He was awaiting SNF placement however on 10/10/2020 he became more lethargic, febrile, tachycardic and tachypneic.  He was in A. fib with RVR. Patient was noted to be in sepsis with right lower lobe pneumonia.  He was started on IV Zosyn, IV amiodarone and heparin gtt. PCCM was consulted.  11/12, core track placed by IR for dysphagia. 11/13, patient complained of right flank pain. CT abdomen pelvis was obtained which showed large right perinephric fluid collection (17cmX7.4 x 4.3 cm).  It was suspected that patient might have had perforation of intestine due to core track.  11/14, patient underwent drain placement by IR.  Output looked bilious and grew Klebsiella and Candida.   11/16, patient was transferred to hospitalist service.  Subjective: Patient was seen and examined this morning. Elderly male of Chinese origin.  Unable to communicate in [place]. Able to follow motor commands to move right side.  Has left hemiplegia. Tenderness present in the abdomen.  Assessment/Plan: Acute right MCA stroke -Status post thrombectomy and right MCA stent placement by IR -Has left hemiplegia and dysphagia. -Currently on cangrelor. -Currently n.p.o.  Has NG tube in place but not in use because of bowel perforation.  Sepsis - not POA -Secondary  to combination of right lower lobe pneumonia and intra-abdominal abscess -Currently on broad-spectrum antibiotics and antifungal. -Hemodynamically stable.  Intra-abdominal abscess / Contained bowel perforation. -Patient has an IR drain placement since 11/14. -Patient continues to have abdominal pain. -General surgery consultation obtained -Recommended NG tube to low intermittent suction, n.p.o. and TPN. -Continue pain control with IV morphine.  Dysphagia -Secondary to stroke.  11/12, core track placed by IR. -Currently n.p.o. status.  Hypernatremia -sodium level went up to peak at 152. -Currently on D5 half NS at 75 mill per hour. Recent Labs  Lab 10/11/20 0915 10/11/20 1706 10/12/20 0314 10/13/20 1009 10/14/20 0116 10/15/20 0408 10/16/20 0508 10/17/20 0920  NA 139 140 143 143 145 149* 152* 147*   Paroxysmal atrial fibrillation with RVR -heart rate is controlled on amiodarone.  However unable to keep oral meds.  Will keep antidiarrheal hold.  I will order for metoprolol IV as needed.  Transaminitis Hypoalbuminemia -Liver enzymes seems to be worsening.  Obtain fresh set of liver enzymes tomorrow. Recent Labs  Lab 10/11/20 1706 10/13/20 1009 10/15/20 0408  AST 101* 105* 134*  ALT 45* 60* 70*  ALKPHOS 96 163* 200*  BILITOT 1.3* 0.8 1.3*  PROT 5.3* 5.0* 5.1*  ALBUMIN 1.6* 1.4* 1.3*   Melena History of peptic ulcer disease status post partial gastrectomy -Hemoccult positive in this admission.  Hemoglobin stable.  GI signed off.  Hypertension -Lopressor on hold due to hypotension  Acute kidney injury -patient baseline creatinine is around 0.8-1, creatinine has been steadily rising.  Today creatinine is 1.32,likely from prerenal azotemia.  Continue IV fluids as above. Follow BMP in am. Recent Labs  10/09/20 0329 10/10/20 0259 10/11/20 0915 10/11/20 1706 10/12/20 0314 10/13/20 1009 10/14/20 0116 10/15/20 0408 10/16/20 0508 10/17/20 0920  BUN 27* 36* 47*  47* 42* 43* 42* 45* 46* 33*  CREATININE 1.48* 1.69* 1.96* 1.86* 1.61* 1.40* 1.35* 1.40* 1.32* 1.10   Hypokalemia -IV replacement ordered today. Recent Labs  Lab 10/13/20 1009 10/13/20 1658 10/14/20 0116 10/15/20 0408 10/15/20 1630 10/16/20 0508 10/16/20 1652 10/17/20 0920  K 3.1*  --  3.5 3.3*  --  4.3  --  3.1*  MG 2.1   < > 2.2  --  2.4 2.4 2.4 2.2  PHOS 2.6   < > 2.8  --  3.7 2.9 3.9 4.0   < > = values in this interval not displayed.   Diabetes mellitus type 2 -CBG well controlled on sliding scale insulin.  Mobility: PT eval Code Status:   Code Status: Prior  Nutritional status: Body mass index is 21.72 kg/m. Nutrition Problem: Severe Malnutrition Etiology: chronic illness (stomach ulcer s/p partial gastrectomy) Signs/Symptoms: moderate fat depletion, severe fat depletion, moderate muscle depletion, severe muscle depletion Diet Order            Diet NPO time specified Except for: Other (See Comments)  Diet effective now                 DVT prophylaxis: Heparin drip on hold.  I will order for SCD boots.  Antimicrobials:  IV Unasyn, IV fluconazole Fluid: D5 half NS at 75 mill per hour Consultants: IR, general surgery, neurology, PCCM Family Communication:  Family not at bedside  Status is: Inpatient  Remains inpatient appropriate because -bowel perforation status  Dispo: The patient is from: Home              Anticipated d/c is to: Home versus SNF              Anticipated d/c date is: > 3 days              Patient currently is not medically stable to d/c.       Infusions:  . sodium chloride 10 mL/hr at 10/15/20 1831  . ampicillin-sulbactam (UNASYN) IV    . cangrelor 50 mg in NS 250 mL 0.75 mcg/kg/min (10/17/20 1408)  . dextrose 5 % and 0.2 % NaCl 75 mL/hr at 10/17/20 0547  . dextrose 5 % and 0.2 % NaCl    . fluconazole (DIFLUCAN) IV    . potassium chloride 10 mEq (10/17/20 1332)  . TPN ADULT (ION)      Scheduled Meds: .  stroke: mapping our  early stages of recovery book   Does not apply Once  . chlorhexidine  15 mL Mouth Rinse BID  . Chlorhexidine Gluconate Cloth  6 each Topical Daily  . feeding supplement (PROSource TF)  45 mL Per Tube TID  . free water  200 mL Per Tube Q4H  . insulin aspart  0-9 Units Subcutaneous Q4H  . mouth rinse  15 mL Mouth Rinse q12n4p  . pantoprazole (PROTONIX) IV  40 mg Intravenous Q12H  . sodium chloride flush  10-40 mL Intracatheter Q12H  . sodium chloride flush  5 mL Intracatheter Q8H    Antimicrobials: Anti-infectives (From admission, onward)   Start     Dose/Rate Route Frequency Ordered Stop   10/17/20 1400  Ampicillin-Sulbactam (UNASYN) 3 g in sodium chloride 0.9 % 100 mL IVPB        3 g 200 mL/hr over 30 Minutes Intravenous Every 6  hours 10/17/20 1207     10/17/20 1400  fluconazole (DIFLUCAN) IVPB 400 mg        400 mg 100 mL/hr over 120 Minutes Intravenous Every 24 hours 10/17/20 1207     10/15/20 1630  vancomycin (VANCOREADY) IVPB 500 mg/100 mL  Status:  Discontinued        500 mg 100 mL/hr over 60 Minutes Intravenous Every 12 hours 10/15/20 0940 10/17/20 1205   10/14/20 0400  vancomycin (VANCOREADY) IVPB 500 mg/100 mL  Status:  Discontinued        500 mg 100 mL/hr over 60 Minutes Intravenous Every 24 hours 10/13/20 0233 10/15/20 0940   10/13/20 0330  vancomycin (VANCOREADY) IVPB 750 mg/150 mL        750 mg 150 mL/hr over 60 Minutes Intravenous  Once 10/13/20 0233 10/13/20 0439   10/12/20 1800  ceFEPIme (MAXIPIME) 2 g in sodium chloride 0.9 % 100 mL IVPB  Status:  Discontinued        2 g 200 mL/hr over 30 Minutes Intravenous Every 24 hours 10/11/20 1718 10/11/20 1742   10/12/20 1800  vancomycin (VANCOREADY) IVPB 750 mg/150 mL  Status:  Discontinued        750 mg 150 mL/hr over 60 Minutes Intravenous Every 24 hours 10/11/20 1724 10/11/20 1758   10/12/20 0200  metroNIDAZOLE (FLAGYL) tablet 500 mg  Status:  Discontinued        500 mg Oral Every 8 hours 10/11/20 1715 10/11/20 1758    10/12/20 0030  piperacillin-tazobactam (ZOSYN) IVPB 3.375 g  Status:  Discontinued        3.375 g 12.5 mL/hr over 240 Minutes Intravenous Every 8 hours 10/11/20 1805 10/17/20 1205   10/11/20 1830  piperacillin-tazobactam (ZOSYN) IVPB 3.375 g        3.375 g 100 mL/hr over 30 Minutes Intravenous  Once 10/11/20 1805 10/11/20 1839   10/11/20 1715  ceFEPIme (MAXIPIME) 2 g in sodium chloride 0.9 % 100 mL IVPB  Status:  Discontinued        2 g 200 mL/hr over 30 Minutes Intravenous STAT 10/11/20 1710 10/11/20 1742   10/11/20 1715  metroNIDAZOLE (FLAGYL) IVPB 500 mg  Status:  Discontinued        500 mg 100 mL/hr over 60 Minutes Intravenous STAT 10/11/20 1710 10/11/20 1807   10/11/20 1715  vancomycin (VANCOCIN) IVPB 1000 mg/200 mL premix  Status:  Discontinued        1,000 mg 200 mL/hr over 60 Minutes Intravenous STAT 10/11/20 1710 10/11/20 1804   10/14/2020 1541  ceFAZolin (ANCEF) 2-4 GM/100ML-% IVPB       Note to Pharmacy: [facility]   : cabinet override      2020/10/14 1541 10/06/20 0344      PRN meds: sodium chloride, metoprolol tartrate, morphine injection, sodium chloride flush   Objective: Vitals:   10/17/20 0808 10/17/20 1300  BP: 140/67   Pulse: 76 92  Resp: (!) 24 (!) 24  Temp: 98.6 F (37 C) 98.3 F (36.8 C)  SpO2: 98% 100%    Intake/Output Summary (Last 24 hours) at 10/17/2020 1426 Last data filed at 10/17/2020 0945 Gross per 24 hour  Intake 2778.9 ml  Output 1725 ml  Net 1053.9 ml   Filed Weights   10/13/20 0414 10/17/20 0415 10/17/20 1127  Weight: 65 kg 59.1 kg 62.9 kg   Weight change:  Body mass index is 21.72 kg/m.   Physical Exam: General exam: Appears calm and comfortable.  Mild distress from  abdominal pain Skin: No rashes, lesions or ulcers. HEENT: Atraumatic, normocephalic, supple neck, no obvious bleeding Lungs: Clear to auscultation bilaterally CVS: Regular rate and rhythm, no murmur GI/Abd mild tenderness in the left flank, bowel sound  present CNS: Alert, awake, language barrier present.  Able to follow motor commands on the right.  Left hemiplegia present Psychiatry: Depressed look Extremities: No pedal edema, no calf tenderness  Data Review: I have personally reviewed the laboratory data and studies available.  Recent Labs  Lab 10/11/20 1706 10/12/20 0314 10/13/20 1009 10/13/20 1009 10/14/20 0116 10/15/20 0408 10/15/20 2158 10/16/20 0508 10/17/20 0920  WBC 5.4   < > 9.4  --  9.7 11.5*  --  12.2* 9.4  NEUTROABS 4.8  --   --   --   --   --   --   --   --   HGB 12.4*   < > 11.4*   < > 10.7* 10.4* 11.4* 10.4* 10.1*  HCT 36.7*   < > 32.4*   < > 30.1* 29.6* 32.1* 29.2* 29.9*  MCV 87.0   < > 84.8  --  82.7 82.2  --  83.4 87.2  PLT 218   < > 222  --  197 254  --  263 243   < > = values in this interval not displayed.   Recent Labs  Lab 10/13/20 1009 10/13/20 1658 10/14/20 0116 10/15/20 0408 10/15/20 1630 10/16/20 0508 10/16/20 1652 10/17/20 0920  NA 143  --  145 149*  --  152*  --  147*  K 3.1*  --  3.5 3.3*  --  4.3  --  3.1*  CL 111  --  113* 116*  --  124*  --  119*  CO2 20*  --  22 20*  --  20*  --  19*  GLUCOSE 194*  --  151* 148*  --  144*  --  158*  BUN 43*  --  42* 45*  --  46*  --  33*  CREATININE 1.40*  --  1.35* 1.40*  --  1.32*  --  1.10  CALCIUM 7.5*  --  7.5* 7.5*  --  7.4*  --  7.4*  MG 2.1   < > 2.2  --  2.4 2.4 2.4 2.2  PHOS 2.6   < > 2.8  --  3.7 2.9 3.9 4.0   < > = values in this interval not displayed.    F/u labs ordered.  Signed, Lorin Glass, MD [facility] 10/17/2020

## 2020-10-17 NOTE — Progress Notes (Signed)
12 Days Post-Op Subjective: Pain appears controlled.  Objective: Vital signs in last 24 hours: Temp:  [98.2 F (36.8 C)-99.1 F (37.3 C)] 98.3 F (36.8 C) (11/17 1300) Pulse Rate:  [67-92] 92 (11/17 1300) Resp:  [21-25] 24 (11/17 1300) BP: (126-158)/(61-91) 140/67 (11/17 0808) SpO2:  [96 %-100 %] 100 % (11/17 1300) Weight:  [59.1 kg-62.9 kg] 62.9 kg (11/17 1127)  Intake/Output from previous day: 11/16 0701 - 11/17 0700 In: 2718.9 [I.V.:836.3; NG/GT:1600; IV Piggyback:232.6] Out: 2195 [Urine:1875; Drains:320] Intake/Output this shift: Total I/O In: 60 [Other:60] Out: 300 [Urine:300]  UOP: 1.8L IR drain: bilious  Physical Exam:  General: Alert and oriented CV: RRR Lungs: Clear Abdomen: Soft, ND, ATTP; IR drain with bilious output Ext: NT, No erythema  Lab Results: Recent Labs    10/15/20 2158 10/16/20 0508 10/17/20 0920  HGB 11.4* 10.4* 10.1*  HCT 32.1* 29.2* 29.9*   BMET Recent Labs    10/16/20 0508 10/17/20 0920  NA 152* 147*  K 4.3 3.1*  CL 124* 119*  CO2 20* 19*  GLUCOSE 144* 158*  BUN 46* 33*  CREATININE 1.32* 1.10  CALCIUM 7.4* 7.4*     Studies/Results: CT ABDOMEN PELVIS WO CONTRAST  Result Date: 10/16/2020 CLINICAL DATA:  Peritonitis or perforation suspected. EXAM: CT ABDOMEN AND PELVIS WITHOUT CONTRAST TECHNIQUE: Multidetector CT imaging of the abdomen and pelvis was performed following the standard protocol without IV contrast. COMPARISON:  10/13/2020 FINDINGS: Lower chest: Moderate bilateral pleural effusions with bilateral lower lobe atelectasis or consolidation, similar to prior study. Heart is normal size. Hepatobiliary: No focal hepatic abnormality. Gallbladder contracted and filled with stones. Pancreas: No focal abnormality or ductal dilatation. Spleen: No focal abnormality.  Normal size. Adrenals/Urinary Tract: Gas seen within the urinary bladder, stable since prior study. Several small nonobstructing right renal stones. No  hydronephrosis or ureteral stones. Adrenal glands unremarkable. Bilateral exophytic renal cysts noted. There is a drainage catheter now in place within the previously seen perinephric gas and fluid collection. The previously seen fluid collection has decreased in overall size. Gas and contrast material is seen within the perinephric space. Contrast also appears to extend into the right paracolic gutter. This is concerning for possible communication with the right colon. Stomach/Bowel: Mild wall thickening and irregularity along the posterior wall of the ascending colon adjacent to the contrast seen in the right perinephric space and paracolic gutter. No evidence of bowel obstruction. Stomach and small bowel decompressed. NG tube tip is in the stomach. Vascular/Lymphatic: Aortic atherosclerosis. No evidence of aneurysm or adenopathy. Reproductive: No visible focal abnormality. Other: Small to moderate free fluid in the pelvis. Musculoskeletal: No acute bony abnormality. IMPRESSION: Interval placement of drainage catheter into the right perinephric space in the area of previously seen gas and fluid collection. Fluid collection has decreased since prior study. There is now contrast material seen within the perinephric space and extending into the right paracolic gutter. This is concerning for possible fistulous communication to the colon. May consider contrast injection through the drainage catheter under fluoroscopic guidance to assess for enteric fistula. Moderate bilateral pleural effusions with bibasilar atelectasis or consolidation, unchanged. Cholelithiasis. NG tube in the stomach. Aortic atherosclerosis. Small to moderate free fluid in the pelvis. Electronically Signed   By: Charlett Nose M.D.   On: 10/16/2020 17:21   DG Chest 1 View  Result Date: 10/16/2020 CLINICAL DATA:  Tachycardia EXAM: CHEST  1 VIEW COMPARISON:  10/13/2020 FINDINGS: Unchanged left basilar opacities. Hazy diffuse opacities are also  unchanged. Esophageal  catheter courses below the field of view. Cardiac silhouette size is normal. IMPRESSION: Unchanged left basilar opacities and hazy diffuse opacities bilaterally. Electronically Signed   By: Deatra Robinson M.D.   On: 10/16/2020 01:15   DG Abd Portable 1V  Result Date: 10/16/2020 CLINICAL DATA:  Nasogastric tube placement EXAM: PORTABLE ABDOMEN - 1 VIEW COMPARISON:  One day prior FINDINGS: Removal of feeding tube with placement of nasogastric tube. This terminates at the gastric body. Percutaneous drain projects over the right upper pelvis. Gas-filled small bowel loops at up to 2.9 cm throughout the left side of the abdomen. Minimal contrast within the ascending colon. IMPRESSION: Nasogastric tube terminating at the gastric body. Gas-filled upper normal sized small bowel loops could mild adynamic ileus or less likely low-grade partial small bowel obstruction. Electronically Signed   By: Jeronimo Greaves M.D.   On: 10/16/2020 13:29   [place] EKG SITE RITE  Result Date: 10/17/2020 If Site Rite image not attached, placement could not be confirmed due to current cardiac rhythm.   Assessment/Plan: 1. 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 S/p R RP drain placement on 11/14 by IR. 2. Sepsis due to right lower lobe pneumonia and now retroperitoneal abscess 3. HUT:[id number] 4. 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 Also complicated by A. fib with RVR. mL sepsis due to right lower lobe pneumonia and aspiration and retroperitoneal abscess 5. Multiple bilateral renal cysts seen on CT A/P with contrast 10/13/2020 and non-contrast 10/16/2020 6. Small right renal stones measuring up to 4 mm on CT A/P 10/13/2020  -Events noted -Reviewed CT  A/P 11/16 with now what appears to be contrast from RP space into right paracolic gutter, concerning for possible enteric fistula -Drain to bulb suction -NPO/NGT/TPN per general surgery -Abx per ID -Renal lesions appear to be cysts -Following peripherally. Please call with questions.   LOS: 12 days   Jannifer Hick 10/17/2020, 2:11 PM Matt R. Dalayna Lauter MD [facility]  Pager: [phone]

## 2020-10-17 NOTE — Consult Note (Signed)
Consult Note  Mark May 08/16/1956  [id number].    Requesting MD: Dahal Chief Complaint/Reason for Consult: bowel perforation with intra-abdominal abscess HPI:  Patient is a 64 year old male who was admitted 11/5 with stroke symptoms. Workup revealed patient to have right MCA occlusion status post thrombectomy and stent placement 11/5. Hospitalization complicated by hypoxic respiratory failure secondary to pulmonary edema requiring BiPAP and lasix.  Awaiting CIR placement however on the evening of 11/10, he was noted to become more lethargic with tachycardia and tachypnea. Had complained of abdominal pain with some distention and chest pain which resolved after burping.  KUB obtained which was normal. On 11/11, somewhat more responsive but with increasing sCr in which lisinopril was stopped and development of fever 102.6  TRH initially consulted for help with medical management however on their evaluation, PCCM consulted given ill appearance and developing hypotension and new onset Afib with RVR found to have sepsis with RLL pneumonia likely due to aspiration started on zosyn. PCCM consulted for continue right abdominal pain and distension with concern for bowel perforation secondary to Cortrak. General surgery asked to evaluate for possible bowel perforation.   PMH otherwise significant for HTN, Hx of PUD s/p partial gastrectomy 1990 in [place], Gout, BPH, Sciatica. NKDA. Patient was independent prior to admission. Son present and assisted in translation and discussion of plans. He understands that patient is a high risk for complication with recent stroke and would like to avoid surgery if possible.   ROS: Review of Systems  Constitutional: Negative for chills and fever.  Respiratory: Negative for shortness of breath.   Cardiovascular: Negative for chest pain.  Gastrointestinal: Positive for abdominal pain. Negative for constipation, diarrhea, nausea and vomiting.  All other systems  reviewed and are negative.   Family History  Problem Relation Age of Onset  . Diabetes Mother   . Diabetes Father     Past Medical History:  Diagnosis Date  . Gout   . Hypertension     Past Surgical History:  Procedure Laterality Date  . PARTIAL GASTRECTOMY  1990   done in [place] for PUD  . PROSTATE BIOPSY  ~ 2005   negative for cancer (done in [place])  . RADIOLOGY WITH ANESTHESIA N/A 2020-10-20   Procedure: IR WITH ANESTHESIA;  Surgeon: Radiologist, Medication, MD;  Location: [facility] OR;  Service: Radiology;  Laterality: N/A;    Social History:  reports that he has never smoked. He has never used smokeless tobacco. He reports that he does not drink alcohol and does not use drugs.  Allergies: No Known Allergies  Medications Prior to Admission  Medication Sig Dispense Refill  . allopurinol (ZYLOPRIM) 100 MG tablet Take 3 tablets (300 mg total) by mouth daily. (Patient not taking: Reported on 10/09/2020) 90 tablet 3  . amLODipine (NORVASC) 10 MG tablet Take 1 tablet (10 mg total) by mouth daily. (Patient not taking: Reported on 10/09/2020) 30 tablet 5  . tamsulosin (FLOMAX) 0.4 MG CAPS capsule Take 1 capsule (0.4 mg total) by mouth daily after supper. (Patient not taking: Reported on 10/09/2020) 30 capsule 5    Blood pressure 140/67, pulse 76, temperature 98.6 F (37 C), temperature source Oral, resp. rate (!) 24, weight 59.1 kg, SpO2 98 %. Physical Exam:  General: pleasant, WD, cachectic male who is laying in bed in NAD HEENT: head is normocephalic, atraumatic.  Sclera are noninjected.  PERRL.  Ears and nose without any masses or lesions.  Mouth is pink and  moist Heart: regular, rate, and rhythm.  Normal s1,s2. No obvious murmurs, gallops, or rubs noted.  Palpable radial and pedal pulses bilaterally Lungs: CTAB, no wheezes, rhonchi, or rales noted.  Respiratory effort nonlabored Abd: soft, mildly ttp in LUQ, ND, +BS, NGT with dark bilious appearing fluid, R sided drain with purulent and  bilious appearing drainage MS: all 4 extremities are symmetrical with no cyanosis, clubbing, or edema. Skin: warm and dry with no masses, lesions, or rashes Neuro: Cranial nerves 2-12 grossly intact, sensation is normal throughout Psych: A&Ox3 with an appropriate affect.   Results for orders placed or performed during the hospital encounter of 11/02/2020 (from the past 48 hour(s))  Glucose, capillary     Status: Abnormal   Collection Time: 10/15/20 12:26 PM  Result Value Ref Range   Glucose-Capillary 116 (H) 70 - 99 mg/dL    Comment: Glucose reference range applies only to samples taken after fasting for at least 8 hours.  Glucose, capillary     Status: Abnormal   Collection Time: 10/15/20  3:49 PM  Result Value Ref Range   Glucose-Capillary 129 (H) 70 - 99 mg/dL    Comment: Glucose reference range applies only to samples taken after fasting for at least 8 hours.  Heparin level (unfractionated)     Status: None   Collection Time: 10/15/20  4:13 PM  Result Value Ref Range   Heparin Unfractionated 0.30 0.30 - 0.70 IU/mL    Comment: (NOTE) If heparin results are below expected values, and patient dosage has  been confirmed, suggest follow up testing of antithrombin III levels. Performed at [facility], [address], [place], [place] [address]   Magnesium     Status: None   Collection Time: 10/15/20  4:30 PM  Result Value Ref Range   Magnesium 2.4 1.7 - 2.4 mg/dL    Comment: Performed at [facility], [address] [place], [place] [address]  Phosphorus     Status: None   Collection Time: 10/15/20  4:30 PM  Result Value Ref Range   Phosphorus 3.7 2.5 - 4.6 mg/dL    Comment: Performed at [facility], [address], [place], [place] [address]  Glucose, capillary     Status: Abnormal   Collection Time: 10/15/20  7:52 PM  Result Value Ref Range   Glucose-Capillary 157 (H) 70 - 99 mg/dL    Comment: Glucose reference range applies only to samples taken after  fasting for at least 8 hours.   Comment 1 Notify RN    Comment 2 Document in Chart   Hemoglobin and hematocrit, blood     Status: Abnormal   Collection Time: 10/15/20  9:58 PM  Result Value Ref Range   Hemoglobin 11.4 (L) 13.0 - 17.0 g/dL   HCT 24.2 (L) 39 - 52 %    Comment: Performed at [facility], [address] [place], [place] [address]  Glucose, capillary     Status: Abnormal   Collection Time: 10/16/20 12:09 AM  Result Value Ref Range   Glucose-Capillary 197 (H) 70 - 99 mg/dL    Comment: Glucose reference range applies only to samples taken after fasting for at least 8 hours.   Comment 1 Notify RN    Comment 2 Document in Chart   Blood gas, arterial     Status: Abnormal   Collection Time: 10/16/20 12:12 AM  Result Value Ref Range   FIO2 44.00    pH, Arterial  7.495 (H) 7.35 - 7.45   pCO2 arterial 26.7 (L) 32 - 48 mmHg   pO2, Arterial 76.5 (L) 83 - 108 mmHg   Bicarbonate 20.4 20.0 - 28.0 mmol/L   Acid-base deficit 2.4 (H) 0.0 - 2.0 mmol/L   O2 Saturation 95.1 %   Patient temperature 37.0    Collection site RIGHT RADIAL    Drawn by [id number]     Comment: COLLECTED BY RT   Sample type ARTERIAL DRAW    Allens test (pass/fail) PASS PASS    Comment: Performed at [facility], [address] [place], [place] [address]  Glucose, capillary     Status: Abnormal   Collection Time: 10/16/20  4:13 AM  Result Value Ref Range   Glucose-Capillary 128 (H) 70 - 99 mg/dL    Comment: Glucose reference range applies only to samples taken after fasting for at least 8 hours.   Comment 1 Notify RN    Comment 2 Document in Chart   CBC     Status: Abnormal   Collection Time: 10/16/20  5:08 AM  Result Value Ref Range   WBC 12.2 (H) 4.0 - 10.5 K/uL   RBC 3.50 (L) 4.22 - 5.81 MIL/uL   Hemoglobin 10.4 (L) 13.0 - 17.0 g/dL   HCT 09.8 (L) 39 - 52 %   MCV 83.4 80.0 - 100.0 fL   MCH 29.7 26.0 - 34.0 pg   MCHC 35.6 30.0 - 36.0 g/dL   RDW 11.9 14.7 - 82.9 %   Platelets 263 150 - 400  K/uL   nRBC 0.2 0.0 - 0.2 %    Comment: Performed at [facility], [address] [place], [place] [address]  Basic metabolic panel     Status: Abnormal   Collection Time: 10/16/20  5:08 AM  Result Value Ref Range   Sodium 152 (H) 135 - 145 mmol/L   Potassium 4.3 3.5 - 5.1 mmol/L   Chloride 124 (H) 98 - 111 mmol/L   CO2 20 (L) 22 - 32 mmol/L   Glucose, Bld 144 (H) 70 - 99 mg/dL    Comment: Glucose reference range applies only to samples taken after fasting for at least 8 hours.   BUN 46 (H) 8 - 23 mg/dL   Creatinine, Ser 0.86 (H) 0.61 - 1.24 mg/dL   Calcium 7.4 (L) 8.9 - 10.3 mg/dL   GFR, Estimated 60 (L) >60 mL/min    Comment: (NOTE) Calculated using the CKD-EPI Creatinine Equation (2021)    Anion gap 8 5 - 15    Comment: Performed at [facility], [address] [place], [place] [address]  Magnesium     Status: None   Collection Time: 10/16/20  5:08 AM  Result Value Ref Range   Magnesium 2.4 1.7 - 2.4 mg/dL    Comment: Performed at [facility], [address] [place], [place] [address]  Phosphorus     Status: None   Collection Time: 10/16/20  5:08 AM  Result Value Ref Range   Phosphorus 2.9 2.5 - 4.6 mg/dL    Comment: Performed at [facility], [address] [place], [place] [address]  Glucose, capillary     Status: Abnormal   Collection Time: 10/16/20  7:57 AM  Result Value Ref Range   Glucose-Capillary 147 (H) 70 - 99 mg/dL    Comment: Glucose reference range applies only to samples taken after fasting for at least 8 hours.   Comment  1 Notify RN    Comment 2 Document in Chart   Glucose, capillary     Status: Abnormal   Collection Time: 10/16/20 11:23 AM  Result Value Ref Range   Glucose-Capillary 135 (H) 70 - 99 mg/dL    Comment: Glucose reference range applies only to samples taken after fasting for at least 8 hours.   Comment 1 Notify RN    Comment 2 Document in Chart   Glucose, capillary     Status: Abnormal   Collection Time:  10/16/20  4:04 PM  Result Value Ref Range   Glucose-Capillary 151 (H) 70 - 99 mg/dL    Comment: Glucose reference range applies only to samples taken after fasting for at least 8 hours.   Comment 1 Notify RN    Comment 2 Document in Chart   Magnesium     Status: None   Collection Time: 10/16/20  4:52 PM  Result Value Ref Range   Magnesium 2.4 1.7 - 2.4 mg/dL    Comment: Performed at [facility], [address], [place], [place] [address]  Phosphorus     Status: None   Collection Time: 10/16/20  4:52 PM  Result Value Ref Range   Phosphorus 3.9 2.5 - 4.6 mg/dL    Comment: Performed at [facility], [address], [place], [place] [address]  Glucose, capillary     Status: Abnormal   Collection Time: 10/16/20  7:57 PM  Result Value Ref Range   Glucose-Capillary 115 (H) 70 - 99 mg/dL    Comment: Glucose reference range applies only to samples taken after fasting for at least 8 hours.  Glucose, capillary     Status: Abnormal   Collection Time: 10/16/20 11:39 PM  Result Value Ref Range   Glucose-Capillary 131 (H) 70 - 99 mg/dL    Comment: Glucose reference range applies only to samples taken after fasting for at least 8 hours.   Comment 1 Notify RN    Comment 2 Document in Chart   Glucose, capillary     Status: Abnormal   Collection Time: 10/17/20  3:28 AM  Result Value Ref Range   Glucose-Capillary 147 (H) 70 - 99 mg/dL    Comment: Glucose reference range applies only to samples taken after fasting for at least 8 hours.   Comment 1 Notify RN    Comment 2 Document in Chart   Glucose, capillary     Status: Abnormal   Collection Time: 10/17/20  8:52 AM  Result Value Ref Range   Glucose-Capillary 140 (H) 70 - 99 mg/dL    Comment: Glucose reference range applies only to samples taken after fasting for at least 8 hours.  CBC     Status: Abnormal   Collection Time: 10/17/20  9:20 AM  Result Value Ref Range   WBC 9.4 4.0 - 10.5 K/uL   RBC 3.43 (L) 4.22 - 5.81 MIL/uL    Hemoglobin 10.1 (L) 13.0 - 17.0 g/dL   HCT 09.8 (L) 39 - 52 %   MCV 87.2 80.0 - 100.0 fL   MCH 29.4 26.0 - 34.0 pg   MCHC 33.8 30.0 - 36.0 g/dL   RDW 11.9 14.7 - 82.9 %   Platelets 243 150 - 400 K/uL   nRBC 0.2 0.0 - 0.2 %    Comment: Performed at [facility], [address], [place], [place] [address]  Basic metabolic panel     Status: Abnormal   Collection Time: 10/17/20  9:20 AM  Result Value Ref Range   Sodium 147 (H) 135 - 145 mmol/L   Potassium 3.1 (L) 3.5 - 5.1 mmol/L   Chloride 119 (H) 98 - 111 mmol/L   CO2 19 (L) 22 - 32 mmol/L   Glucose, Bld 158 (H) 70 - 99 mg/dL    Comment: Glucose reference range applies only to samples taken after fasting for at least 8 hours.   BUN 33 (H) 8 - 23 mg/dL   Creatinine, Ser 4.12 0.61 - 1.24 mg/dL   Calcium 7.4 (L) 8.9 - 10.3 mg/dL   GFR, Estimated >87 >86 mL/min    Comment: (NOTE) Calculated using the CKD-EPI Creatinine Equation (2021)    Anion gap 9 5 - 15    Comment: Performed at [facility], [address] [place], [place] [address]  Magnesium     Status: None   Collection Time: 10/17/20  9:20 AM  Result Value Ref Range   Magnesium 2.2 1.7 - 2.4 mg/dL    Comment: Performed at [facility], [address], [place], [place] [address]  Phosphorus     Status: None   Collection Time: 10/17/20  9:20 AM  Result Value Ref Range   Phosphorus 4.0 2.5 - 4.6 mg/dL    Comment: Performed at [facility], [address] [place], [place] [address]   CT ABDOMEN PELVIS WO CONTRAST  Result Date: 10/16/2020 CLINICAL DATA:  Peritonitis or perforation suspected. EXAM: CT ABDOMEN AND PELVIS WITHOUT CONTRAST TECHNIQUE: Multidetector CT imaging of the abdomen and pelvis was performed following the standard protocol without IV contrast. COMPARISON:  10/13/2020 FINDINGS: Lower chest: Moderate bilateral pleural effusions with bilateral lower lobe atelectasis or consolidation, similar to prior study. Heart is normal size.  Hepatobiliary: No focal hepatic abnormality. Gallbladder contracted and filled with stones. Pancreas: No focal abnormality or ductal dilatation. Spleen: No focal abnormality.  Normal size. Adrenals/Urinary Tract: Gas seen within the urinary bladder, stable since prior study. Several small nonobstructing right renal stones. No hydronephrosis or ureteral stones. Adrenal glands unremarkable. Bilateral exophytic renal cysts noted. There is a drainage catheter now in place within the previously seen perinephric gas and fluid collection. The previously seen fluid collection has decreased in overall size. Gas and contrast material is seen within the perinephric space. Contrast also appears to extend into the right paracolic gutter. This is concerning for possible communication with the right colon. Stomach/Bowel: Mild wall thickening and irregularity along the posterior wall of the ascending colon adjacent to the contrast seen in the right perinephric space and paracolic gutter. No evidence of bowel obstruction. Stomach and small bowel decompressed. NG tube tip is in the stomach. Vascular/Lymphatic: Aortic atherosclerosis. No evidence of aneurysm or adenopathy. Reproductive: No visible focal abnormality. Other: Small to moderate free fluid in the pelvis. Musculoskeletal: No acute bony abnormality. IMPRESSION: Interval placement of drainage catheter into the right perinephric space in the area of previously seen gas and fluid collection. Fluid collection has decreased since prior study. There is now contrast material seen within the perinephric space and extending into the right paracolic gutter. This is concerning for possible fistulous communication to the colon. May consider contrast injection through the drainage catheter under fluoroscopic guidance to assess for enteric fistula. Moderate bilateral pleural effusions with bibasilar atelectasis or consolidation, unchanged. Cholelithiasis. NG tube in the stomach. Aortic  atherosclerosis. Small to moderate free fluid in the pelvis. Electronically Signed   By: Charlett Nose M.D.   On:  10/16/2020 17:21   DG Chest 1 View  Result Date: 10/16/2020 CLINICAL DATA:  Tachycardia EXAM: CHEST  1 VIEW COMPARISON:  10/13/2020 FINDINGS: Unchanged left basilar opacities. Hazy diffuse opacities are also unchanged. Esophageal catheter courses below the field of view. Cardiac silhouette size is normal. IMPRESSION: Unchanged left basilar opacities and hazy diffuse opacities bilaterally. Electronically Signed   By: Deatra RobinsonKevin  Herman M.D.   On: 10/16/2020 01:15   DG Abd 1 View  Result Date: 10/15/2020 CLINICAL DATA:  Abdominal pain EXAM: ABDOMEN - 1 VIEW COMPARISON:  Abdomen radiograph October 13, 2020 and CT abdomen and pelvis October 13, 2020 FINDINGS: There is a drain in the right lower quadrant. Feeding tube tip is in the distal stomach region. There are foci of contrast in colon and appendix. There is no bowel dilatation or air-fluid level to suggest bowel obstruction. No free air is appreciable on supine examination. IMPRESSION: No bowel obstruction. No free air evident on supine examination. Tube and catheter positions as noted. Electronically Signed   By: Bretta BangWilliam  Woodruff III M.D.   On: 10/15/2020 13:07   DG Abd Portable 1V  Result Date: 10/16/2020 CLINICAL DATA:  Nasogastric tube placement EXAM: PORTABLE ABDOMEN - 1 VIEW COMPARISON:  One day prior FINDINGS: Removal of feeding tube with placement of nasogastric tube. This terminates at the gastric body. Percutaneous drain projects over the right upper pelvis. Gas-filled small bowel loops at up to 2.9 cm throughout the left side of the abdomen. Minimal contrast within the ascending colon. IMPRESSION: Nasogastric tube terminating at the gastric body. Gas-filled upper normal sized small bowel loops could mild adynamic ileus or less likely low-grade partial small bowel obstruction. Electronically Signed   By: Jeronimo GreavesKyle  Talbot M.D.   On:  10/16/2020 13:29   US EKG SITE RITE  Result Date: 10/17/2020 If Site Rite image not attached, placement could not be confirmed due to current cardiac rhythm.     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 Will plan UGI vs CT with PO contrast likely next week for evaluate for persistent leak. At that time if patient appears to have leak will need to consider continuing TPN and NPO vs palliative consult vs surgical intervention understanding that patient is a high risk surgical candidate given recent stroke - we will continue to follow along   FEN: NPO, IVF, PICC and TPN ordered  VTE: SCDs, ok to have SQ heparin or lovenox from a surgery perspective, would hold on PO antiplatelet agent ID: [id number]  Juliet RudeKelly R Shawnell Dykes, [facility] 10/17/2020, 10:38 AM Please see Amion for pager number during day hours 7:00am-4:30pm

## 2020-10-17 NOTE — Progress Notes (Signed)
NAME:  Mark May, MRN:  [id number], DOB:  [birth year], LOS: 12 ADMISSION DATE:  10/27/2020, CONSULTATION DATE: 10/26/2020 REFERRING MD: Dr. Curtis Sites, CHIEF COMPLAINT: Left-sided weakness  Brief History   64 year old male with hypertension and hyperlipidemia who presented with left-sided weakness, noted to have right MCA occlusion status post thrombectomy and stent placement 11/5.  Hospitalization complicated by hypoxic respiratory failure secondary to pulmonary edema requiring BiPAP and lasix.  Awaiting CIR placement however on the evening of 10/10, he was noted to become more lethargic with tachycardia and tachypnea.  Had complained of abdominal pain with some distention and chest pain which resolved after burping.  KUB obtained which was normal.  On 11/11, some what more responsive but now with increasing sCr in which lisinopril was stopped and development of fever 102.6  TRH initially consulted for help with medical management however on their evaluation, PCCM consulted given ill appearance and developing hypotension and new onset Afib with RVR found to have sepsis with RLL pneumonia likely due to aspiration started on zosyn. PCCM consulted for continue right abdominal pain and distension with concern for bowel perforation secondary to Cortrak.  Past Medical History  Hypertension Peptic ulcer disease Gout BPH Sciatica   Significant Hospital Events   11/5 Mechanical thrombectomy of right MCA and stent 11/5 11/11 PCCM reconsulted  11/12 Cortrak placed Consults:  IR Neurology PCCM 11/5- 11/8; 11/11; 11/16 TRH  Procedures:  11/6 right femoral sheath was removed  Significant Diagnostic Tests:  11/5 CT head: No acute abnormality. Minimal diffuse cerebral and cerebellar atrophy. Mild chronic small vessel white matter ischemic changes in both cerebral hemispheres  11/5 CTA head and neck: Severe distal right M1 stenosis or subocclusive thrombus, Right MCA infarct with extensive penumbra,  4 mm right supraclinoid ICA aneurysm. Widely patent cervical carotid and vertebral arteries.  11/5 postprocedure CT head: Hypodensity in the right lateral temporal lobe now shows mild hemorrhage or contrast enhancement. This is most consistent with acute infarct. There has been interval stenting of the right middle cerebral artery.  11/6 MRI brain: 1. Fairly extensive patchy acute/early subacute infarcts within the right MCA/watershed territory.  2. Additional small acute/early subacute infarcts within the dorsal right thalamus and left basal ganglia. 3. Background moderate chronic small vessel ischemic disease.  11/6  MRA head: 1. Interval stenting of the M1/M2 right middle cerebral artery. Flow related signal is present proximal and distal to the stent suggestive of stent patency. 2. 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  11/5 DG Abd 1 View Gastric catheter within the stomach kinked at the proximal side port. This could be withdrawn slightly S necessary.   11/6 Transthoracic Echocardiogram  1. Left ventricular ejection fraction, by estimation, is 70 to 75%. The left ventricle has hyperdynamic function. The left ventricle has no regional wall motion abnormalities. Left ventricular diastolic parameters are consistent with Grade II diastolic dysfunction (pseudonormalization). Elevated left atrial pressure.  2. Right ventricular systolic function is normal. The right ventricular size is normal.  3. The mitral valve is normal in structure. Mild mitral valve regurgitation. No evidence of mitral stenosis.  4. The aortic valve is normal in structure. Aortic valve regurgitation is not visualized. No aortic stenosis is present.  5. The inferior vena cava is dilated in size with <50% respiratory variability, suggesting right atrial pressure of 15 mmHg.  11/10 KUB >> normal gas pattern  11/13 CT abdomen and pelvis wo contrast 1. Large right  perinephric fluid collection  and pockets of air concerning for an infectious process/abscess. Clinical correlation is recommended. 2. Cholelithiasis. 3. Nonobstructing right renal calculi. No hydronephrosis. 4. Colonic diverticulosis. No bowel obstruction. 5. Partially visualized small bilateral pleural effusions with complete consolidative changes of the visualized lower lobes. 6. Aortic Atherosclerosis (ICD10-I70.0).  11/16 CT A/P IMPRESSION: Interval placement of drainage catheter into the right perinephric space in the area of previously seen gas and fluid collection. Fluid collection has decreased since prior study. There is now contrast material seen within the perinephric space and extending into the right paracolic gutter. This is concerning for possible fistulous communication to the colon. May consider contrast injection through the drainage catheter under fluoroscopic guidance to assess for enteric fistula. Moderate bilateral pleural effusions with bibasilar atelectasis or consolidation, unchanged. Cholelithiasis. NG tube in the stomach. Aortic atherosclerosis. Small to moderate free fluid in the pelvis.  Micro Data:  11/5 MRSA PCR negative 11/10 UC >> 11/11 BCx2 >>  Antimicrobials:  11/5 cefazolin 11/11 Zosyn > 11/15 Vancomycin Interim history/subjective:  No events, slept well, HD stable.  Objective   Blood pressure 140/67, pulse 76, temperature 98.6 F (37 C), temperature source Oral, resp. rate (!) 24, weight 59.1 kg, SpO2 98 %.        Intake/Output Summary (Last 24 hours) at 10/17/2020 1024 Last data filed at 10/17/2020 6314 Gross per 24 hour  Intake 2718.9 ml  Output 2425 ml  Net 293.9 ml   Filed Weights   10/12/20 0500 10/13/20 0414 10/17/20 0415  Weight: 56.5 kg 65 kg 59.1 kg    Examination: Constitutional: thin man in NAD  Eyes: tracking with eyes, pupils equal Ears, nose, mouth, and throat: NGT in place to gravity, MM  dry Cardiovascular: RRR, ext warm Respiratory: Scattered rhonci, mildly tachypneic, stable Gastrointestinal: abdomen soft, +BS mildly TTP, RLQ drain in place with tan colored output Skin: No rashes, normal turgor Neurologic: Moves R side well, left side weaker, R gaze preference, L hemianopia Psychiatric: RASS -1  Sodium a bit better WBC better Hgb stable  Assessment & Plan:   Sepsis due to right lower lobe pneumonia likely aspiration (later worsened by bowel perforation) R lung [place] 11/16 small effusion, underlying consolidation. Should be covered with zosyn.  Intra-abdominal abscess, contained bowel perforation- superior-most edge lines up with distal end of cortrak.  Stomach pain and worsening sepsis started shortly after cortrak placement.  Possible defect on postior wall of jejunum on sagittal cuts, wonder if the "RP" abscess is actually within omentum from cortrak. General surgery to weigh in today. 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

## 2020-10-17 NOTE — Progress Notes (Signed)
Peripherally Inserted Central Catheter Placement  The IV Nurse has discussed with the patient and/or persons authorized to consent for the patient, the purpose of this procedure and the potential benefits and risks involved with this procedure.  The benefits include less needle sticks, lab draws from the catheter, and the patient may be discharged home with the catheter. Risks include, but not limited to, infection, bleeding, blood clot (thrombus formation), and puncture of an artery; nerve damage and irregular heartbeat and possibility to perform a PICC exchange if needed/ordered by physician.  Alternatives to this procedure were also discussed.  Bard Power PICC patient education guide, fact sheet on infection prevention and patient information card has been provided to patient /or left at bedside.    PICC Placement Documentation  PICC Double Lumen 10/17/20 PICC Right Brachial 38 cm 1 cm (Active)  Indication for Insertion or Continuance of Line Administration of hyperosmolar/irritating solutions (i.e. TPN, Vancomycin, etc.) 10/17/20 1124  Exposed Catheter (cm) 1 cm 10/17/20 1124  Site Assessment Clean;Dry;Intact 10/17/20 1124  Lumen #1 Status Flushed;Saline locked;Blood return noted 10/17/20 1124  Lumen #2 Status Flushed;Saline locked;Blood return noted 10/17/20 1124  Dressing Type Transparent;Securing device 10/17/20 1124  Dressing Status Clean;Dry;Intact 10/17/20 1124  Antimicrobial disc in place? Yes 10/17/20 1124  Safety Lock Not Applicable 10/17/20 1124  Dressing Intervention New dressing 10/17/20 1124  Dressing Change Due 10/24/20 10/17/20 1124       Annett Fabian 10/17/2020, 11:25 AM

## 2020-10-18 ENCOUNTER — Inpatient Hospital Stay (HOSPITAL_COMMUNITY): Payer: 59

## 2020-10-18 DIAGNOSIS — R140 Abdominal distension (gaseous): Secondary | ICD-10-CM | POA: Diagnosis not present

## 2020-10-18 DIAGNOSIS — I10 Essential (primary) hypertension: Secondary | ICD-10-CM | POA: Diagnosis not present

## 2020-10-18 DIAGNOSIS — K316 Fistula of stomach and duodenum: Secondary | ICD-10-CM

## 2020-10-18 DIAGNOSIS — R198 Other specified symptoms and signs involving the digestive system and abdomen: Secondary | ICD-10-CM

## 2020-10-18 DIAGNOSIS — K631 Perforation of intestine (nontraumatic): Secondary | ICD-10-CM

## 2020-10-18 DIAGNOSIS — I6601 Occlusion and stenosis of right middle cerebral artery: Secondary | ICD-10-CM | POA: Diagnosis not present

## 2020-10-18 DIAGNOSIS — E43 Unspecified severe protein-calorie malnutrition: Secondary | ICD-10-CM

## 2020-10-18 DIAGNOSIS — R0603 Acute respiratory distress: Secondary | ICD-10-CM | POA: Diagnosis not present

## 2020-10-18 DIAGNOSIS — N179 Acute kidney failure, unspecified: Secondary | ICD-10-CM | POA: Diagnosis not present

## 2020-10-18 DIAGNOSIS — E785 Hyperlipidemia, unspecified: Secondary | ICD-10-CM

## 2020-10-18 LAB — CBC WITH DIFFERENTIAL/PLATELET
Abs Immature Granulocytes: 0.44 10*3/uL — ABNORMAL HIGH (ref 0.00–0.07)
Basophils Absolute: 0 10*3/uL (ref 0.0–0.1)
Basophils Relative: 0 %
Eosinophils Absolute: 0 10*3/uL (ref 0.0–0.5)
Eosinophils Relative: 0 %
HCT: 31.3 % — ABNORMAL LOW (ref 39.0–52.0)
Hemoglobin: 10.5 g/dL — ABNORMAL LOW (ref 13.0–17.0)
Immature Granulocytes: 4 %
Lymphocytes Relative: 9 %
Lymphs Abs: 1 10*3/uL (ref 0.7–4.0)
MCH: 29.5 pg (ref 26.0–34.0)
MCHC: 33.5 g/dL (ref 30.0–36.0)
MCV: 87.9 fL (ref 80.0–100.0)
Monocytes Absolute: 0.5 10*3/uL (ref 0.1–1.0)
Monocytes Relative: 5 %
Neutro Abs: 8.6 10*3/uL — ABNORMAL HIGH (ref 1.7–7.7)
Neutrophils Relative %: 82 %
Platelets: 253 10*3/uL (ref 150–400)
RBC: 3.56 MIL/uL — ABNORMAL LOW (ref 4.22–5.81)
RDW: 14.6 % (ref 11.5–15.5)
WBC: 10.6 10*3/uL — ABNORMAL HIGH (ref 4.0–10.5)
nRBC: 0.2 % (ref 0.0–0.2)

## 2020-10-18 LAB — GLUCOSE, CAPILLARY
Glucose-Capillary: 192 mg/dL — ABNORMAL HIGH (ref 70–99)
Glucose-Capillary: 178 mg/dL — ABNORMAL HIGH (ref 70–99)
Glucose-Capillary: 211 mg/dL — ABNORMAL HIGH (ref 70–99)
Glucose-Capillary: 171 mg/dL — ABNORMAL HIGH (ref 70–99)
Glucose-Capillary: 207 mg/dL — ABNORMAL HIGH (ref 70–99)
Glucose-Capillary: 132 mg/dL — ABNORMAL HIGH (ref 70–99)

## 2020-10-18 LAB — COMPREHENSIVE METABOLIC PANEL
ALT: 44 U/L (ref 0–44)
AST: 38 U/L (ref 15–41)
Albumin: 1.3 g/dL — ABNORMAL LOW (ref 3.5–5.0)
Alkaline Phosphatase: 142 U/L — ABNORMAL HIGH (ref 38–126)
Anion gap: 8 (ref 5–15)
BUN: 27 mg/dL — ABNORMAL HIGH (ref 8–23)
CO2: 19 mmol/L — ABNORMAL LOW (ref 22–32)
Calcium: 7.3 mg/dL — ABNORMAL LOW (ref 8.9–10.3)
Chloride: 121 mmol/L — ABNORMAL HIGH (ref 98–111)
Creatinine, Ser: 1.07 mg/dL (ref 0.61–1.24)
GFR, Estimated: >60 mL/min (ref >60)
Glucose, Bld: 210 mg/dL — ABNORMAL HIGH (ref 70–99)
Potassium: 3.6 mmol/L (ref 3.5–5.1)
Sodium: 148 mmol/L — ABNORMAL HIGH (ref 135–145)
Total Bilirubin: 1.1 mg/dL (ref 0.3–1.2)
Total Protein: 5.1 g/dL — ABNORMAL LOW (ref 6.5–8.1)

## 2020-10-18 LAB — PHOSPHORUS: Phosphorus: 2.7 mg/dL (ref 2.5–4.6)

## 2020-10-18 LAB — CULTURE, BLOOD (ROUTINE X 2)
Culture: NO GROWTH
Culture: NO GROWTH
Special Requests: ADEQUATE

## 2020-10-18 LAB — BLOOD GAS, ARTERIAL
Acid-base deficit: 3.5 mmol/L — ABNORMAL HIGH (ref 0.0–2.0)
Bicarbonate: 19 mmol/L — ABNORMAL LOW (ref 20.0–28.0)
FIO2: 92
O2 Saturation: 92.3 %
Patient temperature: 36
pCO2 arterial: 22.4 mmHg — ABNORMAL LOW (ref 32.0–48.0)
pH, Arterial: 7.532 — ABNORMAL HIGH (ref 7.350–7.450)
pO2, Arterial: 56.7 mmHg — ABNORMAL LOW (ref 83.0–108.0)

## 2020-10-18 LAB — LACTIC ACID, PLASMA: Lactic Acid, Venous: 1.6 mmol/L (ref 0.5–1.9)

## 2020-10-18 LAB — MAGNESIUM: Magnesium: 2.2 mg/dL (ref 1.7–2.4)

## 2020-10-18 MED ORDER — ORAL CARE MOUTH RINSE
15.0000 mL | Freq: Two times a day (BID) | OROMUCOSAL | Status: DC
  Administered 2020-10-19: 15 mL via OROMUCOSAL

## 2020-10-18 MED ORDER — TRAVASOL 10 % IV SOLN
INTRAVENOUS | Status: AC
  Filled 2020-10-18: qty 1094.4

## 2020-10-18 MED ORDER — HYDRALAZINE HCL 20 MG/ML IJ SOLN
10.0000 mg | Freq: Four times a day (QID) | INTRAMUSCULAR | Status: DC | PRN
  Administered 2020-10-18: 10 mg via INTRAVENOUS
  Filled 2020-10-18: qty 1

## 2020-10-18 MED ORDER — ETOMIDATE 2 MG/ML IV SOLN
INTRAVENOUS | Status: AC
  Filled 2020-10-18: qty 20

## 2020-10-18 MED ORDER — MIDAZOLAM HCL 2 MG/2ML IJ SOLN
INTRAMUSCULAR | Status: AC
  Filled 2020-10-18: qty 4

## 2020-10-18 MED ORDER — ROCURONIUM BROMIDE 10 MG/ML (PF) SYRINGE
PREFILLED_SYRINGE | INTRAVENOUS | Status: AC
  Filled 2020-10-18: qty 10

## 2020-10-18 MED ORDER — ACETAMINOPHEN 650 MG RE SUPP
325.0000 mg | RECTAL | Status: DC | PRN
  Administered 2020-10-18 – 2020-10-21 (×2): 325 mg via RECTAL
  Filled 2020-10-18 (×4): qty 1

## 2020-10-18 MED ORDER — LORAZEPAM 2 MG/ML IJ SOLN: 0.5000 mg | Freq: Four times a day (QID) | INTRAMUSCULAR | Status: DC | PRN

## 2020-10-18 MED ORDER — CHLORHEXIDINE GLUCONATE 0.12 % MT SOLN
15.0000 mL | Freq: Two times a day (BID) | OROMUCOSAL | Status: DC
  Administered 2020-10-19 (×3): 15 mL via OROMUCOSAL
  Filled 2020-10-18: qty 15

## 2020-10-18 MED ORDER — DILTIAZEM HCL 25 MG/5ML IV SOLN
15.0000 mg | Freq: Once | INTRAVENOUS | Status: DC
  Filled 2020-10-18: qty 5

## 2020-10-18 MED ORDER — FENTANYL CITRATE (PF) 100 MCG/2ML IJ SOLN
INTRAMUSCULAR | Status: AC
  Filled 2020-10-18: qty 2

## 2020-10-18 NOTE — Significant Event (Signed)
Called to bedside for respiratory distress and decreased LOC.   Brief HPI: Patient was admitted to [facility] on 11/5 with right MCA stroke, underwent thrombectomy and stent, and his course has been complicated by pulm edema s/p diuresis, then a fib RVR, sepsis secondary to RLL pneumonia, and intraabdominal abscess with contained bowel perforation s/p percutaneous drain. He is currently on Unasyn.     A&P: He has increased RR, increased WOB, and worsening confusion tonight per RN and son at bedside. RRT assessed him, obtained stat CXR, and he is found to have new large left pleural effusion and worsening airspace disease.   Dr. Marchelle Gearing of PCCM came to the bedside, his involvement much appreciated, and is recommending transfer to ICU for ongoing eval and tx.

## 2020-10-18 NOTE — Progress Notes (Signed)
13 Days Post-Op   Subjective/Chief Complaint: Pt with no change in abd exam   Objective: Vital signs in last 24 hours: Temp:  [97.8 F (36.6 C)-98.8 F (37.1 C)] 97.8 F (36.6 C) (11/18 0817) Pulse Rate:  [83-92] 91 (11/18 0817) Resp:  [18-24] 20 (11/18 0817) BP: (153-175)/(83-97) 175/93 (11/18 0817) SpO2:  [95 %-100 %] 96 % (11/18 0817) Weight:  [61.9 kg-62.9 kg] 61.9 kg (11/18 0500) Last BM Date: 10/17/20  Intake/Output from previous day: 11/17 0701 - 11/18 0700 In: 2792.5 [I.V.:925; IV Piggyback:1737.6] Out: 2730 [Urine:2325; Emesis/NG output:250; Drains:155] Intake/Output this shift: Total I/O In: 212 [I.V.:212] Out: -   PE:  Constitutional: No acute distress, conversant, appears states age. Eyes: Anicteric sclerae, moist conjunctiva, no lid lag Lungs: Clear to auscultation bilaterally, normal respiratory effort CV: regular rate and rhythm, no murmurs, no peripheral edema, pedal pulses 2+ GI: Soft, no masses or hepatosplenomegaly, min tender to palpation, JP purulent Skin: No rashes, palpation reveals normal turgor Psychiatric: appropriate judgment and insight, oriented to person, place, and time   Lab Results:  Recent Labs    10/17/20 0920 10/18/20 0727  WBC 9.4 10.6*  HGB 10.1* 10.5*  HCT 29.9* 31.3*  PLT 243 253   BMET Recent Labs    10/17/20 0920 10/18/20 0726  NA 147* 148*  K 3.1* 3.6  CL 119* 121*  CO2 19* 19*  GLUCOSE 158* 210*  BUN 33* 27*  CREATININE 1.10 1.07  CALCIUM 7.4* 7.3*   PT/INR No results for input(s): LABPROT, INR in the last 72 hours. ABG Recent Labs    10/16/20 0012  PHART 7.495*  HCO3 20.4    Studies/Results: CT ABDOMEN PELVIS WO CONTRAST  Result Date: 10/16/2020 CLINICAL DATA:  Peritonitis or perforation suspected. EXAM: CT ABDOMEN AND PELVIS WITHOUT CONTRAST TECHNIQUE: Multidetector CT imaging of the abdomen and pelvis was performed following the standard protocol without IV contrast. COMPARISON:  10/13/2020  FINDINGS: Lower chest: Moderate bilateral pleural effusions with bilateral lower lobe atelectasis or consolidation, similar to prior study. Heart is normal size. Hepatobiliary: No focal hepatic abnormality. Gallbladder contracted and filled with stones. Pancreas: No focal abnormality or ductal dilatation. Spleen: No focal abnormality.  Normal size. Adrenals/Urinary Tract: Gas seen within the urinary bladder, stable since prior study. Several small nonobstructing right renal stones. No hydronephrosis or ureteral stones. Adrenal glands unremarkable. Bilateral exophytic renal cysts noted. There is a drainage catheter now in place within the previously seen perinephric gas and fluid collection. The previously seen fluid collection has decreased in overall size. Gas and contrast material is seen within the perinephric space. Contrast also appears to extend into the right paracolic gutter. This is concerning for possible communication with the right colon. Stomach/Bowel: Mild wall thickening and irregularity along the posterior wall of the ascending colon adjacent to the contrast seen in the right perinephric space and paracolic gutter. No evidence of bowel obstruction. Stomach and small bowel decompressed. NG tube tip is in the stomach. Vascular/Lymphatic: Aortic atherosclerosis. No evidence of aneurysm or adenopathy. Reproductive: No visible focal abnormality. Other: Small to moderate free fluid in the pelvis. Musculoskeletal: No acute bony abnormality. IMPRESSION: Interval placement of drainage catheter into the right perinephric space in the area of previously seen gas and fluid collection. Fluid collection has decreased since prior study. There is now contrast material seen within the perinephric space and extending into the right paracolic gutter. This is concerning for possible fistulous communication to the colon. May consider contrast injection through the drainage  catheter under fluoroscopic guidance to assess  for enteric fistula. Moderate bilateral pleural effusions with bibasilar atelectasis or consolidation, unchanged. Cholelithiasis. NG tube in the stomach. Aortic atherosclerosis. Small to moderate free fluid in the pelvis. Electronically Signed   By: Charlett Nose M.D.   On: 10/16/2020 17:21   DG Abd Portable 1V  Result Date: 10/16/2020 CLINICAL DATA:  Nasogastric tube placement EXAM: PORTABLE ABDOMEN - 1 VIEW COMPARISON:  One day prior FINDINGS: Removal of feeding tube with placement of nasogastric tube. This terminates at the gastric body. Percutaneous drain projects over the right upper pelvis. Gas-filled small bowel loops at up to 2.9 cm throughout the left side of the abdomen. Minimal contrast within the ascending colon. IMPRESSION: Nasogastric tube terminating at the gastric body. Gas-filled upper normal sized small bowel loops could mild adynamic ileus or less likely low-grade partial small bowel obstruction. Electronically Signed   By: Jeronimo Greaves M.D.   On: 10/16/2020 13:29   [place] EKG SITE RITE  Result Date: 10/17/2020 If Site Rite image not attached, placement could not be confirmed due to current cardiac rhythm.   Anti-infectives: Anti-infectives (From admission, onward)   Start     Dose/Rate Route Frequency Ordered Stop   10/17/20 1400  Ampicillin-Sulbactam (UNASYN) 3 g in sodium chloride 0.9 % 100 mL IVPB        3 g 200 mL/hr over 30 Minutes Intravenous Every 6 hours 10/17/20 1207     10/17/20 1400  fluconazole (DIFLUCAN) IVPB 400 mg        400 mg 100 mL/hr over 120 Minutes Intravenous Every 24 hours 10/17/20 1207     10/15/20 1630  vancomycin (VANCOREADY) IVPB 500 mg/100 mL  Status:  Discontinued        500 mg 100 mL/hr over 60 Minutes Intravenous Every 12 hours 10/15/20 0940 10/17/20 1205   10/14/20 0400  vancomycin (VANCOREADY) IVPB 500 mg/100 mL  Status:  Discontinued        500 mg 100 mL/hr over 60 Minutes Intravenous Every 24 hours 10/13/20 0233 10/15/20 0940   10/13/20  0330  vancomycin (VANCOREADY) IVPB 750 mg/150 mL        750 mg 150 mL/hr over 60 Minutes Intravenous  Once 10/13/20 0233 10/13/20 0439   10/12/20 1800  ceFEPIme (MAXIPIME) 2 g in sodium chloride 0.9 % 100 mL IVPB  Status:  Discontinued        2 g 200 mL/hr over 30 Minutes Intravenous Every 24 hours 10/11/20 1718 10/11/20 1742   10/12/20 1800  vancomycin (VANCOREADY) IVPB 750 mg/150 mL  Status:  Discontinued        750 mg 150 mL/hr over 60 Minutes Intravenous Every 24 hours 10/11/20 1724 10/11/20 1758   10/12/20 0200  metroNIDAZOLE (FLAGYL) tablet 500 mg  Status:  Discontinued        500 mg Oral Every 8 hours 10/11/20 1715 10/11/20 1758   10/12/20 0030  piperacillin-tazobactam (ZOSYN) IVPB 3.375 g  Status:  Discontinued        3.375 g 12.5 mL/hr over 240 Minutes Intravenous Every 8 hours 10/11/20 1805 10/17/20 1205   10/11/20 1830  piperacillin-tazobactam (ZOSYN) IVPB 3.375 g        3.375 g 100 mL/hr over 30 Minutes Intravenous  Once 10/11/20 1805 10/11/20 1839   10/11/20 1715  ceFEPIme (MAXIPIME) 2 g in sodium chloride 0.9 % 100 mL IVPB  Status:  Discontinued        2 g 200 mL/hr over 30 Minutes  Intravenous STAT 10/11/20 1710 10/11/20 1742   10/11/20 1715  metroNIDAZOLE (FLAGYL) IVPB 500 mg  Status:  Discontinued        500 mg 100 mL/hr over 60 Minutes Intravenous STAT 10/11/20 1710 10/11/20 1807   10/11/20 1715  vancomycin (VANCOCIN) IVPB 1000 mg/200 mL premix  Status:  Discontinued        1,000 mg 200 mL/hr over 60 Minutes Intravenous STAT 10/11/20 1710 10/11/20 1804   10/21/2020 1541  ceFAZolin (ANCEF) 2-4 GM/100ML-% IVPB       Note to Pharmacy: [facility]   : cabinet override      10/08/2020 1541 10/06/20 0344      Assessment/Plan: RLL PNA Metabolic encephalopathy  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  L hemiplegia  Dysphagia  Paroxysmal atrial fibrillation  Hx of PUD s/p partial gastrectomy 1990 in [place]  Contained bowel perforation with intra-abdominal  abscess - s/p IR drain placement 11/14 - output appears bilious, growing klebsiella  - think that duodenum is most likely site of perforation based on earlier CT and drain output/cxs - recommend NGT to LIWS, NPO and TPN - add prealbumin on to previous labs - hopefully with TPN and bowel rest, patient may be able to heal site of perforation on his own. Will plan UGI vs CT with PO contrast likely next week for evaluate for persistent leak. At that time if patient appears to have leak will need to consider continuing TPN and NPO vs palliative consult vs surgical intervention understanding that patient is a high risk surgical candidate given recent stroke - we will continue to follow along   FEN: NPO, IVF, PICC and TPN  VTE: SCDs, ok to have SQ heparin or lovenox from a surgery perspective, would hold on PO antiplatelet agent ID: [email or web address]   LOS: 13 days    Axel Filler 10/18/2020

## 2020-10-18 NOTE — Progress Notes (Signed)
NAME:  Mark May, MRN:  [id number], DOB:  February 24, 1956, LOS: 13 ADMISSION DATE:  10/15/2020, CONSULTATION DATE: 10/26/2020 REFERRING MD: Dr. Curtis Sites, CHIEF COMPLAINT: Left-sided weakness  Brief History   64 year old male with hypertension and hyperlipidemia who presented with left-sided weakness, noted to have right MCA occlusion status post thrombectomy and stent placement 11/5.  Hospitalization complicated by hypoxic respiratory failure secondary to pulmonary edema requiring BiPAP and lasix.  Awaiting CIR placement however on the evening of 10/10, he was noted to become more lethargic with tachycardia and tachypnea.  Had complained of abdominal pain with some distention and chest pain which resolved after burping.  KUB obtained which was normal.  On 11/11, some what more responsive but now with increasing sCr in which lisinopril was stopped and development of fever 102.6  TRH initially consulted for help with medical management however on their evaluation, PCCM consulted given ill appearance and developing hypotension and new onset Afib with RVR found to have sepsis with RLL pneumonia likely due to aspiration started on zosyn. PCCM consulted for continue right abdominal pain and distension with concern for bowel perforation secondary to Cortrak.  Past Medical History  Hypertension Peptic ulcer disease Gout BPH Sciatica   Significant Hospital Events   11/5 Mechanical thrombectomy of right MCA and stent 11/5 11/11 PCCM reconsulted  11/12 Cortrak placed 11/17 0 ccm signed off 11/18 - ccm recalled by rapid response Consults:  IR Neurology PCCM 11/5- 11/8; 11/11; 11/16-11/17, 11/18 -  TR[facility]  Procedures:  11/6 right femoral sheath was removed  ? Date RUE PICC  ? ddate  RLQ drain  Significant Diagnostic Tests:  11/5 CT head: No acute abnormality. Minimal diffuse cerebral and cerebellar atrophy. Mild chronic small vessel white matter ischemic changes in both cerebral  hemispheres  11/5 CTA head and neck: Severe distal right M1 stenosis or subocclusive thrombus, Right MCA infarct with extensive penumbra, 4 mm right supraclinoid ICA aneurysm. Widely patent cervical carotid and vertebral arteries.  11/5 postprocedure CT head: Hypodensity in the right lateral temporal lobe now shows mild hemorrhage or contrast enhancement. This is most consistent with acute infarct. There has been interval stenting of the right middle cerebral artery.  11/6 MRI brain: 1. Fairly extensive patchy acute/early subacute infarcts within the right MCA/watershed territory.  2. Additional small acute/early subacute infarcts within the dorsal right thalamus and left basal ganglia. 3. Background moderate chronic small vessel ischemic disease.  11/6  MRA head: 1. Interval stenting of the M1/M2 right middle cerebral artery. Flow related signal is present proximal and distal to the stent suggestive of stent patency. 2. 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  11/5 DG Abd 1 View Gastric catheter within the stomach kinked at the proximal side port. This could be withdrawn slightly S necessary.   11/6 Transthoracic Echocardiogram  1. Left ventricular ejection fraction, by estimation, is 70 to 75%. The left ventricle has hyperdynamic function. The left ventricle has no regional wall motion abnormalities. Left ventricular diastolic parameters are consistent with Grade II diastolic dysfunction (pseudonormalization). Elevated left atrial pressure.  2. Right ventricular systolic function is normal. The right ventricular size is normal.  3. The mitral valve is normal in structure. Mild mitral valve regurgitation. No evidence of mitral stenosis.  4. The aortic valve is normal in structure. Aortic valve regurgitation is not visualized. No aortic stenosis is present.  5. The inferior vena cava is dilated in size with <50% respiratory variability,  suggesting right  atrial pressure of 15 mmHg.  11/10 KUB >> normal gas pattern  11/13 CT abdomen and pelvis wo contrast 1. Large right perinephric fluid collection and pockets of air concerning for an infectious process/abscess. Clinical correlation is recommended. 2. Cholelithiasis. 3. Nonobstructing right renal calculi. No hydronephrosis. 4. Colonic diverticulosis. No bowel obstruction. 5. Partially visualized small bilateral pleural effusions with complete consolidative changes of the visualized lower lobes. 6. Aortic Atherosclerosis (ICD10-I70.0).  11/16 CT A/P IMPRESSION: Interval placement of drainage catheter into the right perinephric space in the area of previously seen gas and fluid collection. Fluid collection has decreased since prior study. There is now contrast material seen within the perinephric space and extending into the right paracolic gutter. This is concerning for possible fistulous communication to the colon. May consider contrast injection through the drainage catheter under fluoroscopic guidance to assess for enteric fistula. Moderate bilateral pleural effusions with bibasilar atelectasis or consolidation, unchanged. Cholelithiasis. NG tube in the stomach. Aortic atherosclerosis. Small to moderate free fluid in the pelvis.  Micro Data:  11/5 MRSA PCR negative 11/10 UC >> 11/11 BCx2 >>  Antimicrobials:  11/5 cefazolin - 11/6 11/11 Zosyn >11/17 11/11 Flagyl - 11/11 11/11 cefepime - 11/11 11/15 Vancomycin - 11/17 11/17  Unasyn /11/18 11/17 diflucan  xxx 11/18 - vanc 11/18 cefepime    Interim history/subjective:    .11/18 -urgently recalled by [facility] hospitalist and rapid response.  Son is at the bedside.  Antibiotics were changed to Unasyn on 10/17/2020.  Patient has been on 6 L nasal cannula all along.  Sudden escalation in fever to one hundred and three with tachypnea to forty times a minute and more lethargy.  Son states that this big  change in status compared to earlier in the day.  Chest x-ray shows new onset left-sided infiltrates with left pleural effusion that is small.  Hypoxemia requiring 6 L itself is unchanged.  He is on TPN and TF  Objective   Blood pressure (!) 159/77, pulse (!) 102, temperature 99.7 F (37.6 C), temperature source Oral, resp. rate (!) 32, weight 61.9 kg, SpO2 92 %.        Intake/Output Summary (Last 24 hours) at 10/18/2020 2239 Last data filed at 10/18/2020 2200 Gross per 24 hour  Intake 3265.15 ml  Output 3490 ml  Net -224.85 ml   Filed Weights   10/17/20 0415 10/17/20 1127 10/18/20 0500  Weight: 59.1 kg 62.9 kg 61.9 kg      General Appearance:  Looks criticall ill OBESE - no. Thin deconditioned Head:  Normocephalic, without obvious abnormality, atraumatic Eyes:  PERRL - yes, conjunctiva/corneas - muddy     Ears:  Normal external ear canals, both ears Nose:  G tube - YES Throat:  ETT TUBE - no , OG tube - no.  Very poor dentition Neck:  Supple,  No enlargement/tenderness/nodules Lungs: Clear to auscultation bilaterally, tachypneic respiratory rate anywhere from 35-50.  Not paradoxical Heart:  S1 and S2 normal, no murmur, CVP -no.  Pressors -no.  Tachycardic Abdomen:  Soft, no masses, no organomegaly.  Has right lower quadrant drain with tan-colored output no bleeding Genitalia / Rectal:  Not done Extremities:  Extremities-intact Skin:  ntact in exposed areas . Sacral area -no Neurologic:  Sedation -none-> RASS --1/-2. Moves all 4s -flaccid on the left side. CAM-ICU -negative. Orientation -x3       Assessment & Plan:  Severe Resp [place] 10/18/20 with acute hypoxemic resp failure due to sepsis  10/18/2020 0 on 6L [place]  RR 35-50  Plan  - likely intubate  Sepsis due to right lower lobe pneumonia likely aspiration (later worsened by bowel perforation) R lung [place] 11/16 small effusion, underlying consolidation.  10/18/2020 - repeat severe sepsis (SIRS with LLL Pneumonia)  - aspiration v HCAP   Plan  change abx to vanc and cefepime Pan culture Needs Dentition consult at some point Needs fever control  Intra-abdominal abscess, contained bowel perforation- superior-most edge lines up with distal end of cortrak.  Stomach pain and worsening sepsis started shortly after cortrak placement.  Possible defect on postior wall of jejunum on sagittal cuts, wonder if the "RP" abscess is actually within omentum from cortrak.   10/18/2020 - getting TPN and TF  Plan  - hold TF for 10/18/20 onwards and reassess after resp distress cleared  - Per CCS  Metabolic encephalopathy- related to sepsis and increased metabolic demand   10/18/2020 - seems normal  plan -avoid sedating agents, maintain sleep wake cycles  Paroxysmal atrial fibrillation with rapid ventricular response  10/18/2020 - tachycardic. 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 Continue TPN Pain/Anxiety/Delirium protocol (if indicated): na VAP protocol (if indicated): Needs DENTAL consult. Oral care protocol DVT prophylaxis: scd - need to ask neuro about heparin/locenox GI prophylaxis: ppi Glucose control: ssi Mobility: bed rest Code Status: full Family Communication: updated son at bedside Disposition: from 58w21 to 18m09 icu   ATTESTATION & SIGNATURE   The patient Mark May is critically ill with multiple organ systems failure and requires high complexity decision making for assessment and support,  frequent evaluation and titration of therapies, application of advanced monitoring technologies and extensive interpretation of multiple databases.   Critical Care Time devoted to patient care services described in this note is  60  Minutes. This time reflects time of care of this signee Dr Bary Castilla. This critical care time does not reflect procedure time, or teaching time or supervisory time of PA/NP/Med student/Med Resident etc but could involve care discussion time     Dr. Kalman Shan, M.D., [facility].C.P Pulmonary and Critical Care Medicine Staff Physician [facility] Pager: [phone], If no answer or between  15:00h - 7:00h: call [phone]  10/18/2020 10:39 PM     LABS    PULMONARY Recent Labs  Lab 10/16/20 0012 10/18/20 2208  PHART 7.495* 7.532*  PCO2ART 26.7* 22.4*  PO2ART 76.5* 56.7*  HCO3 20.4 19.0*  O2SAT 95.1 92.3    CBC Recent Labs  Lab 10/16/20 0508 10/17/20 0920 10/18/20 0727  HGB 10.4* 10.1* 10.5*  HCT 29.2* 29.9* 31.3*  WBC 12.2* 9.4 10.6*  PLT 263 243 253    COAGULATION No results for input(s): INR in the last 168 hours.  CARDIAC  No results for input(s): TROPONINI in the last 168 hours. No results for input(s): PROBNP in the last 168 hours.   CHEMISTRY Recent Labs  Lab 10/14/20 0116 10/14/20 0116 10/15/20 0408 10/15/20 0408 10/15/20 1630 10/16/20 0508 10/16/20 0508 10/16/20 1652 10/17/20 0920 10/18/20 0726  NA 145  --  149*  --   --  152*  --   --  147* 148*  K 3.5   < > 3.3*   < >  --  4.3   < >  --  3.1* 3.6  CL 113*  --  116*  --   --  124*  --   --  119* 121*  CO2 22  --  20*  --   --  20*  --   --  19* 19*  GLUCOSE 151*  --  148*  --   --  144*  --   --  158* 210*  BUN 42*  --  45*  --   --  46*  --   --  33* 27*  CREATININE 1.35*  --  1.40*  --   --  1.32*  --   --  1.10 1.07  CALCIUM 7.5*  --  7.5*  --   --  7.4*  --   --  7.4* 7.3*  MG 2.2   < >  --   --  2.4  2.4  --  2.4 2.2 2.2  PHOS 2.8   < >  --   --  3.7 2.9  --  3.9 4.0 2.7   < > = values in this interval not displayed.   Estimated Creatinine Clearance: 60.3 mL/min (by C-G formula based on SCr of 1.07 mg/dL).   LIVER Recent Labs  Lab 10/13/20 1009 10/15/20 0408 10/18/20 0726  AST 105* 134* 38  ALT 60* 70* 44  ALKPHOS 163* 200* 142*  BILITOT 0.8 1.3* 1.1  PROT 5.0* 5.1* 5.1*  ALBUMIN 1.4* 1.3* 1.3*     INFECTIOUS Recent Labs  Lab 10/12/20 0453 10/13/20 1009  LATICACIDVEN 1.6 1.8     ENDOCRINE CBG (last 3)  Recent Labs    10/18/20 1145 10/18/20 1556 10/18/20 2027  GLUCAP 211* 171* 207*         IMAGING x48h  - image(s) personally visualized  -   highlighted in bold DG Chest Port 1 View  Result Date: 10/18/2020 CLINICAL DATA:  Acute respiratory distress EXAM: PORTABLE CHEST 1 VIEW COMPARISON:  10/16/2020 FINDINGS: NG tube is in the stomach. Moderate to large left effusion and small to moderate right effusion. Worsening airspace disease throughout the left lung. Continued right lower lobe atelectasis or infiltrate. Heart is normal size. IMPRESSION: Bilateral pleural effusions, left larger than right. Bilateral airspace disease, worsening on the left since prior study, atelectasis versus infection. Electronically Signed   By: Charlett NoseKevin  Dover M.D.   On: 10/18/2020 22:25   US EKG SITE RITE  Result Date: 10/17/2020 If Site Rite image not attached, placement could not be confirmed due to current cardiac rhythm.

## 2020-10-18 NOTE — Progress Notes (Signed)
Physical Therapy Treatment Patient Details Name: Mark May MRN: [id number] DOB: 1955/12/11 Today's Date: 10/18/2020    History of Present Illness Pt is 64 year old male with hypertension and hyperlipidemia who presented with left-sided weakness, noted to have right MCA occlusion status post thrombectomy and stent placement 11/5.  Hospitalization complicated by hypoxic respiratory failure secondary to pulmonary edema requiring BiPAP and lasix. On 10/10/2020 he became more lethargic and tachycardic with tachypnea.  Patient went into A. fib with RVR ,also had a fever.  Due to concern for severe sepsis, PCCM was consulted.  Patient found to have sepsis with right lower lobe pneumonia and likely aspiration.  Placement of drain for R retroperitoneal abscess on 11/14, contained bowel perforation.  Pt with continued abdominal pain , he had NGtube added on 10/18/20.    PT Comments    Pt's daughter was present today and hesitant to have pt participate in any transfers due to abdominal pain yesterday.  Discussed importance of still trying to progress mobility, but can attempt at later date.  Did note pt now with NG tube ; hopeful, for improved abdominal pain and able to participate with mobility next treatment.  Did participate with exercises and provided education to daughter on how to assist with exercises when therapy not present.   Cont plan of care.     Follow Up Recommendations  CIR;Supervision/Assistance - 24 hour     Equipment Recommendations  3in1 (PT);Wheelchair (measurements PT);Wheelchair cushion (measurements PT);Hospital bed    Recommendations for Other Services       Precautions / Restrictions Precautions Precautions: Fall Precaution Comments: L side hemiparesis; monitor BP; watch O2, JP drain, NG tube    Mobility  Bed Mobility               General bed mobility comments: Daughter declined any mobility today due to pt's abdominal pain.  Reports when pt got up yesterday he  had severe abd pain.  Discussed importance of mobility to prevent further complications but family stating that pt was too painful yesterday and they want to give some time for stomach to improve.  Transfers                    Ambulation/Gait                 Stairs             Wheelchair Mobility    Modified Rankin (Stroke Patients Only)       Balance                                            Cognition Arousal/Alertness: Lethargic Behavior During Therapy: Flat affect Overall Cognitive Status: Difficult to assess                                 General Comments: Pt's daughter present and doing some translating but also used Mandarin Intrepretor  "Wave-lin" on [place]. Pt very flat and with minimal communication.  Some participation with exercises but falling asleep and decreased abiltiy to follow commands.  Demonstrates L inattention.      Exercises General Exercises - Upper Extremity Shoulder Flexion: PROM;Left;5 reps;Supine Elbow Flexion: PROM;5 reps;Left Elbow Extension: PROM;5 reps;Left Composite Extension: PROM;Left;5 reps;Supine General Exercises - Lower Extremity Ankle Circles/Pumps: PROM;Left;10 reps;Other (comment) (with  stretch) Short Arc Quad: PROM;10 reps;Supine;Left Heel Slides: AAROM;Left;Supine;10 reps (with resistance for eccentric phase) Hip ABduction/ADduction: AAROM;Left;10 reps;Supine Other Exercises Other Exercises: Educated daughter on PROM and AAROm of supine exercises (arm and leg) with facilitation of verbal cues, having pt look at movement, and providing resistance as needed for certain motions.  Also, educated on positioning of UE and not pulling on arm.  Also, cautioned with L UE movement and wrist movement due to IV location.    General Comments        Pertinent Vitals/Pain Pain Assessment: Faces Faces Pain Scale: No hurt    Home Living                      Prior Function             PT Goals (current goals can now be found in the care plan section) Acute Rehab PT Goals Patient Stated Goal: not stated PT Goal Formulation: With patient/family Time For Goal Achievement: 10/26/2020 Potential to Achieve Goals: Fair Progress towards PT goals: Not progressing toward goals - comment (pain limitng)    Frequency    Min 4X/week      PT Plan Current plan remains appropriate    Co-evaluation              AM-PAC PT "6 Clicks" Mobility   Outcome Measure  Help needed turning from your back to your side while in a flat bed without using bedrails?: Total Help needed moving from lying on your back to sitting on the side of a flat bed without using bedrails?: Total Help needed moving to and from a bed to a chair (including a wheelchair)?: Total Help needed standing up from a chair using your arms (e.g., wheelchair or bedside chair)?: Total Help needed to walk in hospital room?: Total Help needed climbing 3-5 steps with a railing? : Total 6 Click Score: 6    End of Session   Activity Tolerance: Patient limited by pain Patient left: with call bell/phone within reach;in bed;with family/visitor present;with bed alarm set Nurse Communication: Mobility status PT Visit Diagnosis: Other abnormalities of gait and mobility (R26.89);Muscle weakness (generalized) (M62.81);Hemiplegia and hemiparesis;Unsteadiness on feet (R26.81);Difficulty in walking, not elsewhere classified (R26.2);Other symptoms and signs involving the nervous system (R29.898) Hemiplegia - Right/Left: Left Hemiplegia - dominant/non-dominant: Non-dominant Hemiplegia - caused by: Cerebral infarction     Time: 1610-9604 PT Time Calculation (min) (ACUTE ONLY): 28 min  Charges:  $Therapeutic Exercise: 23-37 mins                     Anise Salvo, PT Acute Rehab Services Pager [phone] [facility] [phone]     Rayetta Humphrey 10/18/2020, 4:00 PM

## 2020-10-18 NOTE — Progress Notes (Signed)
STAT gas collected and sent to lab

## 2020-10-18 NOTE — Progress Notes (Signed)
PROGRESS NOTE  Mark May  DOB: 06/10/1956  PCP: Reubin Milan, MD IEP:[id number]  DOA: 10/01/2020  LOS: 13 days   Chief complaint: Left-sided weakness  Brief narrative: Mark May is a 64 y.o. male who presented to the ED at [facility] on 10/13/2020 with left-sided weakness. PMH significant for hypertension, hyperlipidemia, history of peptic ulcer disease status post partial gastrectomy in 1990 in [place], gout, BPH, sciatica.  On admission, imagings showed right MCA occlusion, underwent thrombectomy and stent placement on 10/21/2020.   Patient's hospitalization was complicated by hypoxic respiratory failure due to pulmonary edema requiring BiPAP and Lasix.  He was awaiting SNF placement however on 10/10/2020 he became more lethargic, febrile, tachycardic and tachypneic.  He was in A. fib with RVR. Patient was noted to be in sepsis with right lower lobe pneumonia.  He was started on IV Zosyn, IV amiodarone and heparin gtt. PCCM was consulted.  11/12, core track placed by IR for dysphagia. 11/13, patient complained of right flank pain. CT abdomen pelvis was obtained which showed large right perinephric fluid collection (17cmX7.4 x 4.3 cm).  It was suspected that patient might have had perforation of intestine due to core track.  11/14, patient underwent drain placement by IR.  Output looked bilious and grew Klebsiella and Candida.   11/16, patient was transferred to hospitalist service.  Subjective: Patient was seen and examined this morning. Alert, awake, minimal English proficiency. Denies any pain this morning.  Had NG tube to suction with bilious drainage. Able to follow motor commands to move right side.  Has left hemiplegia.  Assessment/Plan: Acute right MCA stroke -Status post thrombectomy and right MCA stent placement by IR -Has left hemiplegia and dysphagia.  Currently n.p.o. -Currently on aspirin 300 mg per rectum.  Sepsis - not POA -Secondary to combination of right lower  lobe pneumonia and intra-abdominal abscess -Currently on broad-spectrum antibiotics and antifungal. -Hemodynamically stable.  Intra-abdominal abscess / Contained bowel perforation. -Patient has an IR drain placement since 11/14. -Patient continues to have abdominal pain. -General surgery consultation appreciated. -Patient currently has NG tube to low intermittent suction, n.p.o. and TPN. -Continue pain control with IV morphine PRN.  Dysphagia -Secondary to stroke.  11/12, core track placed by IR. -Currently n.p.o. status.  Hypernatremia -sodium level peaked at 152. -Currently improving with D5 half NS at 75 mill per hour.  Continue the same. Recent Labs  Lab 10/11/20 1706 10/12/20 0314 10/13/20 1009 10/14/20 0116 10/15/20 0408 10/16/20 0508 10/17/20 0920 10/18/20 0726  NA 140 143 143 145 149* 152* 147* 148*   Paroxysmal atrial fibrillation with RVR -Currently on IV metoprolol.  Amiodarone on hold because of n.p.o. status.    Transaminitis Hypoalbuminemia -Liver enzymes improved on repeat draw today.   Recent Labs  Lab 10/11/20 1706 10/13/20 1009 10/15/20 0408 10/18/20 0726  AST 101* 105* 134* 38  ALT 45* 60* 70* 44  ALKPHOS 96 163* 200* 142*  BILITOT 1.3* 0.8 1.3* 1.1  PROT 5.3* 5.0* 5.1* 5.1*  ALBUMIN 1.6* 1.4* 1.3* 1.3*   Melena History of peptic ulcer disease status post partial gastrectomy -Hemoccult positive in this admission.  Hemoglobin stable.  GI signed off.  Hypertension -Lopressor on hold due to hypotension  Acute kidney injury -patient's baseline creatinine is around 0.8-1, creatinine has been steadily rising.  Today creatinine is 1.32, likely from prerenal azotemia.  Continue IV fluids as above. Follow BMP in am. Recent Labs    10/10/20 0259 10/11/20 0915 10/11/20 1706 10/12/20 6606  10/13/20 1009 10/14/20 0116 10/15/20 0408 10/16/20 0508 10/17/20 0920 10/18/20 0726  BUN 36* 47* 47* 42* 43* 42* 45* 46* 33* 27*  CREATININE 1.69* 1.96*  1.86* 1.61* 1.40* 1.35* 1.40* 1.32* 1.10 1.07   Hypokalemia -Improved with replacement. Recent Labs  Lab 10/14/20 0116 10/14/20 0116 10/15/20 0408 10/15/20 1630 10/16/20 0508 10/16/20 1652 10/17/20 0920 10/18/20 0726  K 3.5  --  3.3*  --  4.3  --  3.1* 3.6  MG 2.2   < >  --  2.4 2.4 2.4 2.2 2.2  PHOS 2.8   < >  --  3.7 2.9 3.9 4.0 2.7   < > = values in this interval not displayed.   Diabetes mellitus type 2 -CBG well controlled on sliding scale insulin.  Mobility: PT eval Code Status:   Code Status: Prior  Nutritional status: Body mass index is 21.37 kg/m. Nutrition Problem: Severe Malnutrition Etiology: chronic illness (stomach ulcer s/p partial gastrectomy) Signs/Symptoms: moderate fat depletion, severe fat depletion, moderate muscle depletion, severe muscle depletion Diet Order            Diet NPO time specified Except for: Other (See Comments)  Diet effective now                 DVT prophylaxis: SCD boots  Antimicrobials:  IV Unasyn, IV fluconazole Fluid: D5 half NS at 75 mill per hour Consultants: IR, general surgery, neurology, PCCM Family Communication:  Family not at bedside  Status is: Inpatient  Remains inpatient appropriate because -bowel perforation status  Dispo: The patient is from: Home              Anticipated d/c is to: Home versus SNF              Anticipated d/c date is: > 3 days              Patient currently is not medically stable to d/c.   Infusions:  . sodium chloride 10 mL/hr at 10/18/20 0746  . ampicillin-sulbactam (UNASYN) IV 3 g (10/18/20 0811)  . dextrose 5 % and 0.2 % NaCl 35 mL/hr at 10/18/20 0746  . fluconazole (DIFLUCAN) IV Stopped (10/17/20 1631)  . TPN ADULT (ION) 40 mL/hr at 10/18/20 0746  . TPN ADULT (ION)      Scheduled Meds: .  stroke: mapping our early stages of recovery book   Does not apply Once  . aspirin  300 mg Rectal Daily  . chlorhexidine  15 mL Mouth Rinse BID  . Chlorhexidine Gluconate Cloth  6  each Topical Daily  . insulin aspart  0-9 Units Subcutaneous Q4H  . mouth rinse  15 mL Mouth Rinse q12n4p  . pantoprazole (PROTONIX) IV  40 mg Intravenous Q12H  . sodium chloride flush  10-40 mL Intracatheter Q12H  . sodium chloride flush  5 mL Intracatheter Q8H    Antimicrobials: Anti-infectives (From admission, onward)   Start     Dose/Rate Route Frequency Ordered Stop   10/17/20 1400  Ampicillin-Sulbactam (UNASYN) 3 g in sodium chloride 0.9 % 100 mL IVPB        3 g 200 mL/hr over 30 Minutes Intravenous Every 6 hours 10/17/20 1207     10/17/20 1400  fluconazole (DIFLUCAN) IVPB 400 mg        400 mg 100 mL/hr over 120 Minutes Intravenous Every 24 hours 10/17/20 1207     10/15/20 1630  vancomycin (VANCOREADY) IVPB 500 mg/100 mL  Status:  Discontinued  500 mg 100 mL/hr over 60 Minutes Intravenous Every 12 hours 10/15/20 0940 10/17/20 1205   10/14/20 0400  vancomycin (VANCOREADY) IVPB 500 mg/100 mL  Status:  Discontinued        500 mg 100 mL/hr over 60 Minutes Intravenous Every 24 hours 10/13/20 0233 10/15/20 0940   10/13/20 0330  vancomycin (VANCOREADY) IVPB 750 mg/150 mL        750 mg 150 mL/hr over 60 Minutes Intravenous  Once 10/13/20 0233 10/13/20 0439   10/12/20 1800  ceFEPIme (MAXIPIME) 2 g in sodium chloride 0.9 % 100 mL IVPB  Status:  Discontinued        2 g 200 mL/hr over 30 Minutes Intravenous Every 24 hours 10/11/20 1718 10/11/20 1742   10/12/20 1800  vancomycin (VANCOREADY) IVPB 750 mg/150 mL  Status:  Discontinued        750 mg 150 mL/hr over 60 Minutes Intravenous Every 24 hours 10/11/20 1724 10/11/20 1758   10/12/20 0200  metroNIDAZOLE (FLAGYL) tablet 500 mg  Status:  Discontinued        500 mg Oral Every 8 hours 10/11/20 1715 10/11/20 1758   10/12/20 0030  piperacillin-tazobactam (ZOSYN) IVPB 3.375 g  Status:  Discontinued        3.375 g 12.5 mL/hr over 240 Minutes Intravenous Every 8 hours 10/11/20 1805 10/17/20 1205   10/11/20 1830  piperacillin-tazobactam  (ZOSYN) IVPB 3.375 g        3.375 g 100 mL/hr over 30 Minutes Intravenous  Once 10/11/20 1805 10/11/20 1839   10/11/20 1715  ceFEPIme (MAXIPIME) 2 g in sodium chloride 0.9 % 100 mL IVPB  Status:  Discontinued        2 g 200 mL/hr over 30 Minutes Intravenous STAT 10/11/20 1710 10/11/20 1742   10/11/20 1715  metroNIDAZOLE (FLAGYL) IVPB 500 mg  Status:  Discontinued        500 mg 100 mL/hr over 60 Minutes Intravenous STAT 10/11/20 1710 10/11/20 1807   10/11/20 1715  vancomycin (VANCOCIN) IVPB 1000 mg/200 mL premix  Status:  Discontinued        1,000 mg 200 mL/hr over 60 Minutes Intravenous STAT 10/11/20 1710 10/11/20 1804   August 14, 2020 1541  ceFAZolin (ANCEF) 2-4 GM/100ML-% IVPB       Note to Pharmacy: Teofilo PodJohnson, Ericka   : cabinet override      August 14, 2020 1541 10/06/20 0344      PRN meds: sodium chloride, metoprolol tartrate, morphine injection, sodium chloride flush   Objective: Vitals:   10/18/20 0349 10/18/20 0817  BP: (!) 153/87 (!) 175/93  Pulse: 87 91  Resp: 20 20  Temp: 97.8 F (36.6 C) 97.8 F (36.6 C)  SpO2: 96% 96%    Intake/Output Summary (Last 24 hours) at 10/18/2020 1128 Last data filed at 10/18/2020 0746 Gross per 24 hour  Intake 2944.55 ml  Output 2430 ml  Net 514.55 ml   Filed Weights   10/17/20 0415 10/17/20 1127 10/18/20 0500  Weight: 59.1 kg 62.9 kg 61.9 kg   Weight change: 3.8 kg Body mass index is 21.37 kg/m.   Physical Exam: General exam: Not in physical distress.  Denies pain today.  Has NG tube attached to suction with bilious drainage Skin: No rashes, lesions or ulcers. HEENT: Atraumatic, normocephalic, supple neck, no obvious bleeding Lungs: Clear to auscultation bilaterally CVS: Regular rate and rhythm, no murmur GI/Abd mild tenderness in the left flank, bowel sound present CNS: Alert, awake, language barrier present.  Able to follow motor commands  on the right.  Left hemiplegia present Psychiatry: Depressed look Extremities: No pedal edema,  no calf tenderness  Data Review: I have personally reviewed the laboratory data and studies available.  Recent Labs  Lab 10/11/20 1706 10/12/20 0314 10/14/20 0116 10/14/20 0116 10/15/20 0408 10/15/20 2158 10/16/20 0508 10/17/20 0920 10/18/20 0727  WBC 5.4   < > 9.7  --  11.5*  --  12.2* 9.4 10.6*  NEUTROABS 4.8  --   --   --   --   --   --   --  8.6*  HGB 12.4*   < > 10.7*   < > 10.4* 11.4* 10.4* 10.1* 10.5*  HCT 36.7*   < > 30.1*   < > 29.6* 32.1* 29.2* 29.9* 31.3*  MCV 87.0   < > 82.7  --  82.2  --  83.4 87.2 87.9  PLT 218   < > 197  --  254  --  263 243 253   < > = values in this interval not displayed.   Recent Labs  Lab 10/14/20 0116 10/14/20 0116 10/15/20 0408 10/15/20 1630 10/16/20 0508 10/16/20 1652 10/17/20 0920 10/18/20 0726  NA 145  --  149*  --  152*  --  147* 148*  K 3.5  --  3.3*  --  4.3  --  3.1* 3.6  CL 113*  --  116*  --  124*  --  119* 121*  CO2 22  --  20*  --  20*  --  19* 19*  GLUCOSE 151*  --  148*  --  144*  --  158* 210*  BUN 42*  --  45*  --  46*  --  33* 27*  CREATININE 1.35*  --  1.40*  --  1.32*  --  1.10 1.07  CALCIUM 7.5*  --  7.5*  --  7.4*  --  7.4* 7.3*  MG 2.2   < >  --  2.4 2.4 2.4 2.2 2.2  PHOS 2.8   < >  --  3.7 2.9 3.9 4.0 2.7   < > = values in this interval not displayed.    F/u labs ordered.  Signed, Lorin Glass, MD [facility] 10/18/2020

## 2020-10-18 NOTE — Progress Notes (Signed)
PHARMACY - TOTAL PARENTERAL NUTRITION CONSULT NOTE  Indication:  Perforated duodenum  Patient Measurements: Weight: 61.9 kg (136 lb 7.4 oz)   Body mass index is 21.37 kg/m. Usual Weight: 120-125 lbs  Assessment:  19 YOM presented on 10/16/2020 with right MCA occlusion and underwent revascularization.  PMH significant for PUD post partial gastrectomy in 1990.  Patient was started on a dysphagia diet on 10/08/20 and also transferred out of the ICU.  Post-op course complicated by Afib RVR, aspiration PNA/cellulitis and AKI.  He was then started on TF on 10/12/20, which ended on 10/13/20 when CT showed perinephric abscess with gas formation.  Drain was placed and there is concern for fistulous communication to the colon on 10/16/20 CT.  Nutrition has been inadequate since admission and Pharmacy consulted for TPN management.  Per patient's son, patient has no recent weight loss and was eating a balanced diet PTA.  Glucose / Insulin: no hx DM - CBGs well controlled; 8 units per SSI Electrolytes: Na 148 with IVF, K 3.6, high CL and low CO2, Phos 2.7 and Mag 2.2 Renal: SCr 1.07, CK 991 > 630, BUN 27 LFTs / TGs: alk phos 142, AST/ALT 38/44, tbili 1.1.  TG 260 on 11/17 (ILE 15% of total kCal today). Prealbumin / albumin: albumin 1.3 Intake / Output; MIVF: UOP 1.6 ml/kg/hr, drain 167m, BM x1, D51/4NS at 35 ml/hr GI Imaging: none since TPN Surgeries / Procedures: none since TPN  Central access: PICC ordered 10/17/20 TPN start date: 10/17/20  Nutritional Goals (per RD rec on 11/15): 1800-2000 kCal, 100-115gm protein per day  Current Nutrition:  NPO  Plan:  Increase TPN at 80 mL/hr at 1800 (goal rate 80 ml/hr) Electrolytes in TPN: no Na, K 524m/L, Ca 26m58mL, Mag 26mE66m, Phos 10mm64m, max acetate Add standard MVI and trace elements to TPN Continue sensitive SSI Q4H D/c MIVF D51/4NS at 1800 Standard TPN labs and nursing care orders    CathyAlanda SlimrmD, FCCM [facility] Pharmacist Please  see AMION for all Pharmacists' Contact Phone Numbers 10/18/2020, 7:15 AM

## 2020-10-18 NOTE — Progress Notes (Signed)
Inpatient Rehabilitation-Admissions Coordinator   Note pt still undergoing medical workup with plans for UGI vs CT next week. AC will withdraw insurance auth request and follow for medical readiness before proceeding with new authorization.   Cheri Rous, OTR/L  Rehab Admissions Coordinator  [phone] 10/18/2020 2:37 PM

## 2020-10-18 NOTE — Progress Notes (Addendum)
STROKE TEAM PROGRESS NOTE   INTERVAL HISTORY Daughter is at bedside. Pt denies any abdominal pain, now abdomen soft. Currently awake alert, not in acute distress. Less lethargic than yesterday. Still has tachypnea with shallow breathing. On NG tube on wall suction with TPN. Retroperitoneal drainage continues, color changed to more bilious.   OBJECTIVE Vitals:   10/18/20 0817 10/18/20 1146 10/18/20 1236 10/18/20 1436  BP: (!) 175/93 (!) 164/92 (!) 160/84 (!) 164/93  Pulse: 91 95 81 89  Resp: 20 (!) 28 (!) 30 (!) 36  Temp: 97.8 F (36.6 C) 98.1 F (36.7 C) 98.2 F (36.8 C) 98.3 F (36.8 C)  TempSrc: Oral Oral  Oral  SpO2: 96% 96% 96% 94%  Weight:       CBC:  Recent Labs  Lab 10/11/20 1706 10/12/20 0314 10/17/20 0920 10/18/20 0727  WBC 5.4   < > 9.4 10.6*  NEUTROABS 4.8  --   --  8.6*  HGB 12.4*   < > 10.1* 10.5*  HCT 36.7*   < > 29.9* 31.3*  MCV 87.0   < > 87.2 87.9  PLT 218   < > 243 253   < > = values in this interval not displayed.   Basic Metabolic Panel:  Recent Labs  Lab 10/17/20 0920 10/18/20 0726  NA 147* 148*  K 3.1* 3.6  CL 119* 121*  CO2 19* 19*  GLUCOSE 158* 210*  BUN 33* 27*  CREATININE 1.10 1.07  CALCIUM 7.4* 7.3*  MG 2.2 2.2  PHOS 4.0 2.7   Lipid Panel:     Component Value Date/Time   CHOL 173 10/06/2020 0522   CHOL 216 (H) 03/09/2018 1620   TRIG 260 (H) 10/17/2020 0920   HDL 33 (L) 10/06/2020 0522   HDL 59 03/09/2018 1620   CHOLHDL 5.2 10/06/2020 0522   VLDL 55 (H) 10/06/2020 0522   LDLCALC 85 10/06/2020 0522   LDLCALC 136 (H) 03/09/2018 1620   HgbA1c:  Lab Results  Component Value Date   HGBA1C 5.8 (H) 10/06/2020   Urine Drug Screen: No results found for: LABOPIA, COCAINSCRNUR, LABBENZ, AMPHETMU, THCU, LABBARB  Alcohol Level No results found for: [facility]  IMAGING  CT HEAD WO CONTRAST 03-Nov-2020 1. No acute abnormality.  2. Minimal diffuse cerebral and cerebellar atrophy.  3. Mild chronic small vessel white matter ischemic changes  in both cerebral hemispheres.   CT Angio Head W or Wo Contrast CT Angio Neck W and/or Wo Contrast CT CEREBRAL PERFUSION W CONTRAST Nov 03, 2020 1. 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  2. Right MCA infarct with extensive penumbra.  3. 4 mm right supraclinoid ICA aneurysm.  4. Widely patent cervical carotid and vertebral arteries.  5. Aortic Atherosclerosis (ICD10-I70.0).   CT HEAD WO CONTRAST 11-03-2020 Hypodensity in the right lateral temporal lobe now shows mild hemorrhage or contrast enhancement. This is most consistent with acute infarct. There has been interval stenting of the right middle cerebral artery.   CT HEAD WO CONTRAST Nov 03, 2020 Right temporal lobe hypodensity compatible with acute infarct. Decreased hyperdensity within this stroke compared with 4 hours previously compatible with contrast staining rather than hemorrhage. Small area of acute infarct in the right parietal lobe now evident. Right MCA stent.   MRI brain: 10/06/20 1. Fairly extensive patchy acute/early subacute infarcts within the right MCA/watershed territory.  2. Additional small acute/early subacute infarcts within the dorsal right thalamus and left basal ganglia. 3. Background moderate chronic small vessel ischemic disease.  MRA head: 10/06/20  1. Interval stenting of the M1/M2 right middle cerebral artery. Flow related signal is present proximal and distal to the stent suggestive of stent patency. 2. 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  DG Abd 1 View 10/10/2020 Gastric catheter within the stomach kinked at the proximal side port. This could be withdrawn slightly S necessary.   Transthoracic Echocardiogram  10/06/2020 1. Left ventricular ejection fraction, by estimation, is 70 to 75%. The left ventricle has hyperdynamic function. The left ventricle has no regional wall motion abnormalities. Left ventricular diastolic parameters are consistent  with Grade II diastolic dysfunction (pseudonormalization). Elevated left atrial pressure.  2. Right ventricular systolic function is normal. The right ventricular size is normal.  3. The mitral valve is normal in structure. Mild mitral valve regurgitation. No evidence of mitral stenosis.  4. The aortic valve is normal in structure. Aortic valve regurgitation is not visualized. No aortic stenosis is present.  5. The inferior vena cava is dilated in size with <50% respiratory variability, suggesting right atrial pressure of 15 mmHg.  ECG - SR rate 70 BPM. (See cardiology reading for complete details)  No results found.  PHYSICAL EXAM    Temp:  [97.8 F (36.6 C)-98.8 F (37.1 C)] 98.3 F (36.8 C) (11/18 1436) Pulse Rate:  [81-95] 89 (11/18 1436) Resp:  [18-36] 36 (11/18 1436) BP: (153-175)/(83-97) 164/93 (11/18 1436) SpO2:  [94 %-99 %] 94 % (11/18 1436) Weight:  [61.9 kg] 61.9 kg (11/18 0500)  General - malnourished, well developed, in acute distress with epigastric pain.  Ophthalmologic - fundi not visualized due to noncooperation.  Cardiovascular - Regular rhythm and rate.  Abdominal - soft, tenderness on palpation at epigastric region, drainage tube in place continues to drainge.   Neuro -  lethargic, but still orientated to self, age, time and people and place. Severe dysarthria, no aphasia but paucity of speech, able to name and repeat, follows simple commands. Tracking bilaterally, seems to have intact visual field. PERRL, no gaze deviation or palsy. Left facial droop. Tongue midline. Left UE and LE flaccid. RUE at least 4/5 and RLE at least 3/5. Sensation symmetrical, FTN intact on the right and gait not tested.   ASSESSMENT/PLAN Mr. Beck Cofer is a 64 y.o. male with history of hypertension, BPH, sciatica, stomach ulcer status post surgery in the past presented to ER at [facility] for acute onset left-sided weakness, left facial droop and left hemianopia.  CT  head and neck showed right M1 high-grade stenosis versus near occlusive thrombosis.  CT perfusion showed large penumbra.  Patient transferred to [facility] for thrombectomy.  He did not receive IV t-PA due to late presentation (>4.5 hours from time of onset). IR - complete occlusion of RT MCA M 1 seg - thrombectomy ->TICI 3 revascularization with placement of rescue stent m4 mm x 24 mm Atlas for reocclusion with restoration of TICI 3 .  Stroke: R MCA infarct s/p IR TICI3 R M1 w/ stent placement - likely embolic due to new diagnosed afib  CT Head - No acute abnormality. Minimal diffuse cerebral and cerebellar atrophy.    CTA H&N - Severe distal right M1 stenosis or subocclusive thrombus. 4 mm right supraclinoid ICA aneurysm. Widely patent cervical carotid and vertebral arteries.   CTP - Right MCA infarct with extensive penumbra.   MRI head - Fairly extensive patchy acute/early subacute infarcts within the right MCA/watershed territory. Additional small acute/early subacute infarcts within the dorsal right thalamus  and left basal ganglia.  MRA head -  Interval stenting of the M1/M2 right middle cerebral artery. Flow related signal is present proximal and distal to the stent suggestive of stent patency. 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  2D Echo - EF 70 - 75%. No cardiac source of emboli identified.   UE doppler neg DVT  Loyal Jacobson Virus 2 - negative  LDL - 85  HgbA1c - 5.8  VTE prophylaxis - SCDs  No antithrombotic prior to admission, now on ASA 300 PR. Brilinta discontinued for bowel rest, heparin IV discontinued due to GIB.    Therapy recommendations:  CIR  Disposition:  Pending  Atrial Fibrillation w/ RVR, new diagnosis 11/11  CHA2DS2-VASc Score = at least 5,  ?2 oral anticoagulation recommended  Age in Years:  56-74   +1    Sex:  Male   0    Hypertension History:  yes   +1     Diabetes Mellitus:  0  Congestive Heart Failure  History:  yes   +1   Vascular Disease History:  0     Stroke/TIA/Thromboembolism History:  yes   +2 . amio gtt -> amio po -> d/c for bowel rest  . On IV heparin re-started 11/14 -> stopped 11/15 due to GIB . 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32->1.10->1.07  Stopped lisinopril   On IVF with D5+0.2%NS @ 35  On TPN @ 40->80  Avoid nephrotoxins  UGIB Blood loss anemia  Hb 12.4->10.7->10.4->11.4->10.4->10.1->10.5  Stool occult blood positive  GI on board - signed off  Recommend PPI IV bid -> at discharge PPI po Bid for one month and then qd  Heparin IV discontinued  Hypertension Tachycardia  Home BP meds: Amlodipine  off Cleviprex  Stable . Stopped lisinopril d/t rising Cre . Long-term BP goal normotensive  Hyperlipidemia  Home Lipid lowering medication: none  LDL 85, goal < 70  Off lipitor now due to elevated CK level and increasing LFTs and bowel rest  CK 991->630->pending  ALT/AST - 101/45->105/60->134/70->38/44  Dysphagia . Secondary to stroke . on IVF @ 35 . On TPN @ 40->80 . Speech on board   Other Stroke Risk Factors  Advanced age  Other Active Problems  Aortic Atherosclerosis (ICD10-I70.0).   4 mm right supraclinoid ICA aneurysm.  Hypokalemia - 3.1 ->3.5->3.3 ->4.3->3.1->3.6  Hx of PUD s/p  surgery 30 years ago  Hypernatremia Na 149->152->147->148  Hospital day # 13   Marvel Plan, MD PhD Stroke Neurology 10/18/2020 3:33 PM    to contact Stroke Continuity provider, please refer to [email or web address]. After hours, contact General Neurology

## 2020-10-18 NOTE — Progress Notes (Signed)
SLP Cancellation Note  Patient Details Name: Mark May MRN: [id number] DOB: May 31, 1956   Cancelled treatment:       Reason Eval/Treat Not Completed: Medical issues which prohibited therapy. Pt with bowel perforation and on bowen rest with TPN. Will f/u when ready to resume PO trials and swallowing therapy   Bretta Fees, Riley Nearing 10/18/2020, 7:43 AM

## 2020-10-19 DIAGNOSIS — Z515 Encounter for palliative care: Secondary | ICD-10-CM | POA: Diagnosis not present

## 2020-10-19 DIAGNOSIS — N179 Acute kidney failure, unspecified: Secondary | ICD-10-CM | POA: Diagnosis not present

## 2020-10-19 DIAGNOSIS — Z66 Do not resuscitate: Secondary | ICD-10-CM | POA: Diagnosis not present

## 2020-10-19 DIAGNOSIS — Z20822 Contact with and (suspected) exposure to covid-19: Secondary | ICD-10-CM | POA: Diagnosis not present

## 2020-10-19 DIAGNOSIS — I6601 Occlusion and stenosis of right middle cerebral artery: Secondary | ICD-10-CM | POA: Diagnosis not present

## 2020-10-19 DIAGNOSIS — I63311 Cerebral infarction due to thrombosis of right middle cerebral artery: Secondary | ICD-10-CM | POA: Diagnosis not present

## 2020-10-19 DIAGNOSIS — I10 Essential (primary) hypertension: Secondary | ICD-10-CM | POA: Diagnosis not present

## 2020-10-19 DIAGNOSIS — R0603 Acute respiratory distress: Secondary | ICD-10-CM | POA: Diagnosis not present

## 2020-10-19 LAB — CK: Total CK: 107 U/L (ref 49–397)

## 2020-10-19 LAB — BASIC METABOLIC PANEL
Anion gap: 7 (ref 5–15)
BUN: 26 mg/dL — ABNORMAL HIGH (ref 8–23)
CO2: 20 mmol/L — ABNORMAL LOW (ref 22–32)
Calcium: 7.4 mg/dL — ABNORMAL LOW (ref 8.9–10.3)
Chloride: 121 mmol/L — ABNORMAL HIGH (ref 98–111)
Creatinine, Ser: 1.03 mg/dL (ref 0.61–1.24)
GFR, Estimated: >60 mL/min (ref >60)
Glucose, Bld: 130 mg/dL — ABNORMAL HIGH (ref 70–99)
Potassium: 3.6 mmol/L (ref 3.5–5.1)
Sodium: 148 mmol/L — ABNORMAL HIGH (ref 135–145)

## 2020-10-19 LAB — GLUCOSE, CAPILLARY
Glucose-Capillary: 119 mg/dL — ABNORMAL HIGH (ref 70–99)
Glucose-Capillary: 119 mg/dL — ABNORMAL HIGH (ref 70–99)
Glucose-Capillary: 123 mg/dL — ABNORMAL HIGH (ref 70–99)
Glucose-Capillary: 130 mg/dL — ABNORMAL HIGH (ref 70–99)
Glucose-Capillary: 132 mg/dL — ABNORMAL HIGH (ref 70–99)
Glucose-Capillary: 170 mg/dL — ABNORMAL HIGH (ref 70–99)
Glucose-Capillary: 217 mg/dL — ABNORMAL HIGH (ref 70–99)

## 2020-10-19 LAB — TROPONIN I (HIGH SENSITIVITY): Troponin I (High Sensitivity): 28 ng/L — ABNORMAL HIGH (ref <18)

## 2020-10-19 LAB — AEROBIC/ANAEROBIC CULTURE W GRAM STAIN (SURGICAL/DEEP WOUND)

## 2020-10-19 LAB — LACTIC ACID, PLASMA: Lactic Acid, Venous: 1.5 mmol/L (ref 0.5–1.9)

## 2020-10-19 LAB — PROCALCITONIN: Procalcitonin: 1.38 ng/mL

## 2020-10-19 LAB — PHOSPHORUS: Phosphorus: 3 mg/dL (ref 2.5–4.6)

## 2020-10-19 LAB — MAGNESIUM: Magnesium: 2.2 mg/dL (ref 1.7–2.4)

## 2020-10-19 MED ORDER — METRONIDAZOLE IN NACL 5-0.79 MG/ML-% IV SOLN
500.0000 mg | Freq: Three times a day (TID) | INTRAVENOUS | Status: DC
  Administered 2020-10-19 (×2): 500 mg via INTRAVENOUS
  Filled 2020-10-19 (×2): qty 100

## 2020-10-19 MED ORDER — TRAVASOL 10 % IV SOLN
INTRAVENOUS | Status: AC
  Filled 2020-10-19: qty 1094.4

## 2020-10-19 MED ORDER — VANCOMYCIN HCL 500 MG/100ML IV SOLN
500.0000 mg | Freq: Two times a day (BID) | INTRAVENOUS | Status: DC
  Administered 2020-10-19: 500 mg via INTRAVENOUS
  Filled 2020-10-19 (×2): qty 100

## 2020-10-19 MED ORDER — SODIUM CHLORIDE 0.9 % IV SOLN
2.0000 g | Freq: Two times a day (BID) | INTRAVENOUS | Status: DC
  Administered 2020-10-19 (×2): 2 g via INTRAVENOUS
  Filled 2020-10-19 (×2): qty 2

## 2020-10-19 MED ORDER — INSULIN ASPART 100 UNIT/ML ~~LOC~~ SOLN
0.0000 [IU] | SUBCUTANEOUS | Status: DC
  Administered 2020-10-19: 2 [IU] via SUBCUTANEOUS
  Administered 2020-10-19: 3 [IU] via SUBCUTANEOUS
  Administered 2020-10-20: 5 [IU] via SUBCUTANEOUS
  Administered 2020-10-20: 11 [IU] via SUBCUTANEOUS
  Administered 2020-10-20: 3 [IU] via SUBCUTANEOUS
  Administered 2020-10-20 (×3): 5 [IU] via SUBCUTANEOUS
  Administered 2020-10-21: 3 [IU] via SUBCUTANEOUS
  Administered 2020-10-21 (×2): 5 [IU] via SUBCUTANEOUS
  Administered 2020-10-21 (×2): 3 [IU] via SUBCUTANEOUS
  Administered 2020-10-21: 2 [IU] via SUBCUTANEOUS
  Administered 2020-10-22: 5 [IU] via SUBCUTANEOUS
  Administered 2020-10-22 (×2): 2 [IU] via SUBCUTANEOUS
  Administered 2020-10-22 – 2020-10-23 (×2): 5 [IU] via SUBCUTANEOUS
  Administered 2020-10-23: 2 [IU] via SUBCUTANEOUS

## 2020-10-19 MED ORDER — PIPERACILLIN-TAZOBACTAM 3.375 G IVPB
3.3750 g | Freq: Three times a day (TID) | INTRAVENOUS | Status: DC
  Administered 2020-10-19 – 2020-10-31 (×36): 3.375 g via INTRAVENOUS
  Filled 2020-10-19 (×36): qty 50

## 2020-10-19 MED ORDER — DEXTROSE 5 % IV SOLN: INTRAVENOUS | Status: AC

## 2020-10-19 NOTE — Progress Notes (Signed)
14 Days Post-Op   Subjective/Chief Complaint: Pt moved to ICU for respiratory decline    Objective: Vital signs in last 24 hours: Temp:  [97.8 F (36.6 C)-102.4 F (39.1 C)] 100 F (37.8 C) (11/19 0354) Pulse Rate:  [80-121] 105 (11/19 0700) Resp:  [18-38] 35 (11/19 0700) BP: (126-175)/(75-93) 154/88 (11/19 0700) SpO2:  [91 %-96 %] 94 % (11/19 0700) Weight:  [60.6 kg] 60.6 kg (11/19 0500) Last BM Date: 10/17/20  Intake/Output from previous day: 11/18 0701 - 11/19 0700 In: 1546.6 [I.V.:1046.7; IV Piggyback:500] Out: 2495 [Urine:2050; Emesis/NG output:150; Drains:295] Intake/Output this shift: No intake/output data recorded.  PE:  Constitutional: No acute distress, conversant, appears states age. Eyes: Anicteric sclerae, moist conjunctiva, no lid lag Lungs: Clear to auscultation bilaterally, normal respiratory effort CV: regular rate and rhythm, no murmurs, no peripheral edema, pedal pulses 2+ GI: Soft, no masses or hepatosplenomegaly, min tender to palpation, JP purulent Skin: No rashes, palpation reveals normal turgor Psychiatric: appropriate judgment and insight, oriented to person, place, and time  Lab Results:  Recent Labs    10/17/20 0920 10/18/20 0727  WBC 9.4 10.6*  HGB 10.1* 10.5*  HCT 29.9* 31.3*  PLT 243 253   BMET Recent Labs    10/18/20 0726 10/19/20 0032  NA 148* 148*  K 3.6 3.6  CL 121* 121*  CO2 19* 20*  GLUCOSE 210* 130*  BUN 27* 26*  CREATININE 1.07 1.03  CALCIUM 7.3* 7.4*   PT/INR No results for input(s): LABPROT, INR in the last 72 hours. ABG Recent Labs    10/18/20 2208  PHART 7.532*  HCO3 19.0*    Studies/Results: DG Chest Port 1 View  Result Date: 10/18/2020 CLINICAL DATA:  Acute respiratory distress EXAM: PORTABLE CHEST 1 VIEW COMPARISON:  10/16/2020 FINDINGS: NG tube is in the stomach. Moderate to large left effusion and small to moderate right effusion. Worsening airspace disease throughout the left lung. Continued  right lower lobe atelectasis or infiltrate. Heart is normal size. IMPRESSION: Bilateral pleural effusions, left larger than right. Bilateral airspace disease, worsening on the left since prior study, atelectasis versus infection. Electronically Signed   By: Charlett Nose M.D.   On: 10/18/2020 22:25   [place] EKG SITE RITE  Result Date: 10/17/2020 If Site Rite image not attached, placement could not be confirmed due to current cardiac rhythm.   Anti-infectives: Anti-infectives (From admission, onward)   Start     Dose/Rate Route Frequency Ordered Stop   10/19/20 0200  vancomycin (VANCOREADY) IVPB 500 mg/100 mL        500 mg 100 mL/hr over 60 Minutes Intravenous Every 12 hours 10/19/20 0025     10/19/20 0200  metroNIDAZOLE (FLAGYL) IVPB 500 mg        500 mg 100 mL/hr over 60 Minutes Intravenous Every 8 hours 10/19/20 0045     10/19/20 0115  ceFEPIme (MAXIPIME) 2 g in sodium chloride 0.9 % 100 mL IVPB        2 g 200 mL/hr over 30 Minutes Intravenous Every 12 hours 10/19/20 0025     10/17/20 1400  Ampicillin-Sulbactam (UNASYN) 3 g in sodium chloride 0.9 % 100 mL IVPB  Status:  Discontinued        3 g 200 mL/hr over 30 Minutes Intravenous Every 6 hours 10/17/20 1207 10/18/20 2325   10/17/20 1400  fluconazole (DIFLUCAN) IVPB 400 mg        400 mg 100 mL/hr over 120 Minutes Intravenous Every 24 hours 10/17/20 1207  10/15/20 1630  vancomycin (VANCOREADY) IVPB 500 mg/100 mL  Status:  Discontinued        500 mg 100 mL/hr over 60 Minutes Intravenous Every 12 hours 10/15/20 0940 10/17/20 1205   10/14/20 0400  vancomycin (VANCOREADY) IVPB 500 mg/100 mL  Status:  Discontinued        500 mg 100 mL/hr over 60 Minutes Intravenous Every 24 hours 10/13/20 0233 10/15/20 0940   10/13/20 0330  vancomycin (VANCOREADY) IVPB 750 mg/150 mL        750 mg 150 mL/hr over 60 Minutes Intravenous  Once 10/13/20 0233 10/13/20 0439   10/12/20 1800  ceFEPIme (MAXIPIME) 2 g in sodium chloride 0.9 % 100 mL IVPB  Status:   Discontinued        2 g 200 mL/hr over 30 Minutes Intravenous Every 24 hours 10/11/20 1718 10/11/20 1742   10/12/20 1800  vancomycin (VANCOREADY) IVPB 750 mg/150 mL  Status:  Discontinued        750 mg 150 mL/hr over 60 Minutes Intravenous Every 24 hours 10/11/20 1724 10/11/20 1758   10/12/20 0200  metroNIDAZOLE (FLAGYL) tablet 500 mg  Status:  Discontinued        500 mg Oral Every 8 hours 10/11/20 1715 10/11/20 1758   10/12/20 0030  piperacillin-tazobactam (ZOSYN) IVPB 3.375 g  Status:  Discontinued        3.375 g 12.5 mL/hr over 240 Minutes Intravenous Every 8 hours 10/11/20 1805 10/17/20 1205   10/11/20 1830  piperacillin-tazobactam (ZOSYN) IVPB 3.375 g        3.375 g 100 mL/hr over 30 Minutes Intravenous  Once 10/11/20 1805 10/11/20 1839   10/11/20 1715  ceFEPIme (MAXIPIME) 2 g in sodium chloride 0.9 % 100 mL IVPB  Status:  Discontinued        2 g 200 mL/hr over 30 Minutes Intravenous STAT 10/11/20 1710 10/11/20 1742   10/11/20 1715  metroNIDAZOLE (FLAGYL) IVPB 500 mg  Status:  Discontinued        500 mg 100 mL/hr over 60 Minutes Intravenous STAT 10/11/20 1710 10/11/20 1807   10/11/20 1715  vancomycin (VANCOCIN) IVPB 1000 mg/200 mL premix  Status:  Discontinued        1,000 mg 200 mL/hr over 60 Minutes Intravenous STAT 10/11/20 1710 10/11/20 1804   10/04/2020 1541  ceFAZolin (ANCEF) 2-4 GM/100ML-% IVPB       Note to Pharmacy: [facility]   : cabinet override      10/02/2020 1541 10/06/20 0344      Assessment/Plan: RLL PNA Metabolic encephalopathy  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 Will plan UGI vs CT with PO contrast likely next week for evaluate for persistent leak. At that time if patient appears to have leak will need to consider continuing TPN and NPO vs palliative consult vs surgical intervention understanding that patient is a high risk surgical candidate given recent stroke - we will continue to follow along  FEN: NPO, IVF, PICC and TPN  VTE: SCDs, ok to have SQ heparin or lovenox from a surgery perspective, would hold on PO antiplatelet agent ID: [email or web address]  LOS: 14 days    Axel Filler 10/19/2020

## 2020-10-19 NOTE — Significant Event (Signed)
Rapid Response Event Note   Reason for Call :  Was making rounds on 3W and RN stopped me to assess patient.   Initial Focused Assessment:  Lethargic, febrile, tachycardic and tachpneic Left lung sounds diminished to absent NG draining bilious secretions JP drain with bilious secretions Increased work of breathing 40-50s HR 120-130  6L      Interventions:  Chest xray ABG Lactic+ Called TRH and CCM to evaluate  Plan of Care:  Transfer patient to ICU   Event Summary:   MD Notified:  TRH 2206 and CCM 2220 Call Time: 2200 Arrival Time: 2200 End Time:2300  Samuel Bouche, RN

## 2020-10-19 NOTE — Progress Notes (Addendum)
Occupational Therapy Treatment Patient Details Name: Mark May MRN: [id number] DOB: 10-Jul-1956 Today's Date: 10/19/2020    History of present illness Pt is 64 year old male with hypertension and hyperlipidemia who presented with left-sided weakness, noted to have right MCA occlusion status post thrombectomy and stent placement 11/5.  Hospitalization complicated by hypoxic respiratory failure secondary to pulmonary edema requiring BiPAP and lasix. On 10/10/2020 he became more lethargic and tachycardic with tachypnea.  Patient went into A. fib with RVR ,also had a fever.  Due to concern for severe sepsis, PCCM was consulted.  Patient found to have sepsis with right lower lobe pneumonia and likely aspiration.  Placement of drain for R retroperitoneal abscess on 11/14, contained bowel perforation.  Pt with continued abdominal pain , he had NGtube added on 10/18/20. 11/18 increased RR, increased WOB and worsening confusion. Stat CXR showed pt to have new large left pleural effusion and worsening airspace disease.    OT comments  Pt transferred to ICU from floor this date. Pt declined OOB mobility this session despite education on importance of mobility. Pt reports dizziness at rest and endorses headache. Pt agreeable to bed level mobility. He required maxA to roll toward the left and totalA to roll toward the right. Pt with L side hemiparesis and L sided neglect. Pt verbalized awareness of left sided deficits. Pt's daughter present during session and translating throughout. Educated pt and daughter on importance of mobility progression, positional changes in bed, and BUE/BLE HEP. Pt will continue to benefit from skilled OT services to maximize safety and independence with ADL/IADL and functional mobility. Will continue to follow acutely and progress as tolerated. Continue to recommend CIR follow-up therapy.   HR 104-107bpm at rest, RR 37-50, BP 162/88 RN aware of vitals, SpO2 94-96 on 6L.min via nasal canula   Follow Up Recommendations  CIR;Supervision/Assistance - 24 hour    Equipment Recommendations  Wheelchair (measurements OT);Wheelchair cushion (measurements OT);Hospital bed;3 in 1 bedside commode    Recommendations for Other Services Rehab consult    Precautions / Restrictions Precautions Precautions: Fall Precaution Comments: L side hemiparesis; monitor BP; watch O2, JP drain, NG tube Restrictions Weight Bearing Restrictions: No       Mobility Bed Mobility Overal bed mobility: Needs Assistance Bed Mobility: Rolling Rolling: Max assist;Total assist         General bed mobility comments: maxA to roll toward L, totalA to roll toward R  Transfers                 General transfer comment: not appropriate due to RR and dizziness    Balance       Sitting balance - Comments: unable today due to dizziness                                   ADL either performed or assessed with clinical judgement   ADL Overall ADL's : Needs assistance/impaired Eating/Feeding: NPO   Grooming: Maximal assistance   Upper Body Bathing: Maximal assistance   Lower Body Bathing: Maximal assistance       Lower Body Dressing: Total assistance     Toilet Transfer Details (indicate cue type and reason): deferred   Toileting - Clothing Manipulation Details (indicate cue type and reason): pt with condom cath on, rolling R<>L with maxA toward left and total A toward R       General ADL Comments: pt limited this session secondary to  reports of light headedness at rest BP 162/88, RR 37-45 at rest, up to 50 with rolling, left pt in slightly elevated HOB position, pt unable to tolerate seated in chair position due to increased dizziness     Vision   Vision Assessment?: Vision impaired- to be further tested in functional context Additional Comments: will further assess   Perception     Praxis      Cognition Arousal/Alertness: Lethargic Behavior During Therapy:  Flat affect Overall Cognitive Status: Difficult to assess Area of Impairment: Attention;Following commands;Problem solving                   Current Attention Level: Focused   Following Commands: Follows one step commands inconsistently;Follows one step commands with increased time   Awareness: Intellectual Problem Solving: Slow processing;Decreased initiation;Difficulty sequencing;Requires verbal cues;Requires tactile cues General Comments: Pt's daughter present and translating throughout session, pt very soft spoken this session, pt demonstrates L inattention, pt verbalized awareness of LUE/LLE deficits. Pt following commands with increased time, pt very flat during session        Exercises Exercises: General Upper Extremity;General Lower Extremity;Other exercises General Exercises - Upper Extremity Shoulder Flexion: PROM;Left;5 reps;Supine;Right (AROM RUE) Elbow Flexion: PROM;5 reps;Left;Right (AROm RUE) Elbow Extension: PROM;5 reps;Left;Right (AROM RUE) Composite Extension: PROM;Left;5 reps;Supine;Right (AROM RUE) General Exercises - Lower Extremity Ankle Circles/Pumps: PROM;Left;10 reps;Other (comment) (with stretch) Short Arc Quad: PROM;10 reps;Supine;Left Heel Slides: AAROM;Left;Supine;10 reps (with resistance for eccentric phase) Hip ABduction/ADduction: AAROM;Left;10 reps;Supine Other Exercises Other Exercises: educated daughter on PROM and AAROM of supine exercises BUE and BLE, with multimodal cues and appropriate resistance.  Other Exercises: educated pt and daughter on importance of positional changes to prevent pressure areas and importance of mobility    Shoulder Instructions       General Comments HR 104-107bpm at rest, RR 37-50, BP 162/88 RN aware of vitals, SpO2 94-96 on 6L.min via nasal canula    Pertinent Vitals/ Pain       Pain Assessment: No/denies pain Pain Location: pt endorses no pain but reports of a headache Pain Intervention(s): Monitored  during session;Limited activity within patient's tolerance  Home Living                                          Prior Functioning/Environment              Frequency  Min 2X/week        Progress Toward Goals  OT Goals(current goals can now be found in the care plan section)  Progress towards OT goals: Not progressing toward goals - comment (pt declining further mobility)  Acute Rehab OT Goals Patient Stated Goal: not stated OT Goal Formulation: Patient unable to participate in goal setting Time For Goal Achievement: 10/23/20 Potential to Achieve Goals: Good ADL Goals Pt Will Perform Grooming: with min guard assist;sitting Pt Will Perform Upper Body Dressing: with mod assist;sitting Pt Will Transfer to Toilet: with +2 assist;with min assist;bedside commode Additional ADL Goal #1: pt will visually scan and locate 2 objects in L visual field 50% of session Additional ADL Goal #2: Pt will follow 2 step command 75% of attempts  Plan Discharge plan remains appropriate;Frequency remains appropriate    Co-evaluation                 AM-PAC OT "6 Clicks" Daily Activity     Outcome Measure  Help from another person eating meals?: Total Help from another person taking care of personal grooming?: A Lot Help from another person toileting, which includes using toliet, bedpan, or urinal?: Total Help from another person bathing (including washing, rinsing, drying)?: Total Help from another person to put on and taking off regular upper body clothing?: Total Help from another person to put on and taking off regular lower body clothing?: Total 6 Click Score: 7    End of Session Equipment Utilized During Treatment: Oxygen  OT Visit Diagnosis: Unsteadiness on feet (R26.81);Muscle weakness (generalized) (M62.81);Other symptoms and signs involving cognitive function;Hemiplegia and hemiparesis Hemiplegia - Right/Left: Left Hemiplegia - dominant/non-dominant:  Non-Dominant Hemiplegia - caused by: Cerebral infarction   Activity Tolerance Patient limited by lethargy;Patient limited by pain   Patient Left in bed;with call bell/phone within reach;with nursing/sitter in room   Nurse Communication Mobility status        Time: 1638-4665 OT Time Calculation (min): 23 min  Charges: OT General Charges $OT Visit: 1 Visit OT Treatments $Self Care/Home Management : 8-22 mins $Therapeutic Activity: 8-22 mins  Rosey Bath OTR/L Acute Rehabilitation Services Office: [phone]    Rebeca Alert 10/19/2020, 1:16 PM

## 2020-10-19 NOTE — Progress Notes (Signed)
SLP Cancellation Note  Patient Details Name: Mark May MRN: [id number] DOB: 07-22-1956   Cancelled treatment:       Reason Eval/Treat Not Completed: Patient not medically ready. Events of yesterday noted. Will follow along for readiness for resumption of PO trials. Pt strict NPO with TPN, needs bowel rest.    Mark May, Riley Nearing 10/19/2020, 9:04 AM

## 2020-10-19 NOTE — Progress Notes (Signed)
PHARMACY - TOTAL PARENTERAL NUTRITION CONSULT NOTE  Indication:  Perforated duodenum  Patient Measurements: Weight: 60.6 kg (133 lb 9.6 oz)   Body mass index is 20.92 kg/m. Usual Weight: 120-125 lbs  Assessment:  69 YOM presented on 10/23/2020 with right MCA occlusion and underwent revascularization.  PMH significant for PUD post partial gastrectomy in 1990.  Patient was started on a dysphagia diet on 10/08/20 and also transferred out of the ICU.  Post-op course complicated by Afib RVR, aspiration PNA/cellulitis and AKI.  He was then started on TF on 10/12/20, which ended on 10/13/20 when CT showed perinephric abscess with gas formation.  Drain was placed and there is concern for fistulous communication to the colon on 10/16/20 CT.  Nutrition has been inadequate since admission and Pharmacy consulted for TPN management.  Per patient's son, patient has no recent weight loss and was eating a balanced diet PTA.  Patient transferred to ICU on 10/18/2020 for increased WOB, lethargy, and tachycardia. Patient currently on 6L HFNC. TPN was intended to be continued per CCM but per [facility] was stopped at ~2230. Confirmed with ICU RN that TPN is off and had been discarded.   Glucose / Insulin: no hx DM - CBGs ranging 119 - 207 (x1); CBGs controlled off TPN and CBG 207 on increased rate of 80 mL/hr. Utilized 11 units / 24 hrs Electrolytes: Na 148 (now off Unasyn). K 3.6. Cl 121. CO2  20. Phos 3.0. Mg 2.2. Corr Ca 9.6.  Renal: SCr 1.03, CK 991 > 630 > 107, BUN 26 - decreasing.  LFTs / TGs: alk phos 142, AST/ALT 38/44, tbili 1.1.  TG 260 on 11/17 (ILE 15%) Prealbumin / albumin: albumin 1.3. Prealbumin 9.4.  Intake / Output; MIVF: UOP 1.4 ml/kg/hr, drain 254m. No MIVF. Neg 1.1L.  GI Imaging: none since TPN Surgeries / Procedures: none since TPN  Central access: PICC 10/17/20 TPN start date: 10/17/20  Nutritional Goals (per RD rec on 11/17): 1800-2000 kCal, 100-115gm protein per day  Current Nutrition:   NPO  Plan:  Resume TPN at goal rate of 80 mL/hr at 1800 today - dextrose concentration lowered slightly and kept within goal total kcal due to hyperglycemia after starting yesterday Start D5W at 550mhr and end at 1800 when TPN starts Adjusted electrolytes in TPN: Increase K to 60 mEq/L  Continued electrolytes in TPN: 0 mEq/L Na, 5 mEq/L Ca, 5 mEq/L Mg, 10 mmol/L Phos, max acetate Continue ILE 15% and follow up triglycerides  Add standard MVI and trace elements to TPN Increase SSI to moderate SSI q4h  Standard TPN labs and nursing care orders   GrCristela FeltPharmD Clinical Pharmacist  10/19/2020, 7:08 AM

## 2020-10-19 NOTE — Progress Notes (Addendum)
Referring Physician(s): Dr. Katrinka Blazing  Supervising Physician: Richarda Overlie  Patient Status:  [facility] - In-pt  Chief Complaint: Right retroperitoneal/perinephric abscess s/p right retroperitoneal drain placement in IR 10/14/2020.  Subjective: Patient in bed, awake and resting comfortably. Male visitor at the bedside provided translation today.   HPI: Medical history significant for PUD s/p partial gastrectomy in [place], 1990. He was admitted 10/07/2020 with right MCA occlusion, now s/p thrombectomy and stent placement. His hospitalization was complicated by afib with RVR and hypoxic respiratory failure secondary to pulmonary edema. He later developed severe flank pain and was found to have a large right perinephric fluid collection - concern for perforated ulcer with drainage around the right kidney. He presented to IR 10/14/20 for drain placement. Cultures positive for klebsiella.    Allergies: Patient has no known allergies.  Medications: Prior to Admission medications   Medication Sig Start Date End Date Taking? Authorizing Provider  allopurinol (ZYLOPRIM) 100 MG tablet Take 3 tablets (300 mg total) by mouth daily. Patient not taking: Reported on 10/09/2020 02/17/18   Reubin Milan, MD  amLODipine (NORVASC) 10 MG tablet Take 1 tablet (10 mg total) by mouth daily. Patient not taking: Reported on 10/09/2020 02/17/18   Reubin Milan, MD  tamsulosin [facility]) 0.4 MG CAPS capsule Take 1 capsule (0.4 mg total) by mouth daily after supper. Patient not taking: Reported on 10/09/2020 03/09/18   Reubin Milan, MD     Vital Signs: BP (!) 160/91   Pulse (!) 105   Temp 98.1 F (36.7 C) (Axillary)   Resp (!) 34   Wt 133 lb 9.6 oz (60.6 kg)   SpO2 94%   BMI 20.92 kg/m   Physical Exam Constitutional:      General: He is not in acute distress.    Appearance: He is ill-appearing.  Cardiovascular:     Rate and Rhythm: Tachycardia present.  Pulmonary:     Effort: Pulmonary effort is normal.   Abdominal:     Palpations: Abdomen is soft.     Comments: Right retroperitoneal drain site without tenderness, erythema, drainage, or active bleeding; approximately 20 cc thick brown fluid in suction bulb.    Skin:    General: Skin is warm and dry.  Neurological:     Mental Status: He is alert and oriented to person, place, and time.     Imaging: CT ABDOMEN PELVIS WO CONTRAST  Result Date: 10/16/2020 CLINICAL DATA:  Peritonitis or perforation suspected. EXAM: CT ABDOMEN AND PELVIS WITHOUT CONTRAST TECHNIQUE: Multidetector CT imaging of the abdomen and pelvis was performed following the standard protocol without IV contrast. COMPARISON:  10/13/2020 FINDINGS: Lower chest: Moderate bilateral pleural effusions with bilateral lower lobe atelectasis or consolidation, similar to prior study. Heart is normal size. Hepatobiliary: No focal hepatic abnormality. Gallbladder contracted and filled with stones. Pancreas: No focal abnormality or ductal dilatation. Spleen: No focal abnormality.  Normal size. Adrenals/Urinary Tract: Gas seen within the urinary bladder, stable since prior study. Several small nonobstructing right renal stones. No hydronephrosis or ureteral stones. Adrenal glands unremarkable. Bilateral exophytic renal cysts noted. There is a drainage catheter now in place within the previously seen perinephric gas and fluid collection. The previously seen fluid collection has decreased in overall size. Gas and contrast material is seen within the perinephric space. Contrast also appears to extend into the right paracolic gutter. This is concerning for possible communication with the right colon. Stomach/Bowel: Mild wall thickening and irregularity along the posterior wall of the  ascending colon adjacent to the contrast seen in the right perinephric space and paracolic gutter. No evidence of bowel obstruction. Stomach and small bowel decompressed. NG tube tip is in the stomach. Vascular/Lymphatic:  Aortic atherosclerosis. No evidence of aneurysm or adenopathy. Reproductive: No visible focal abnormality. Other: Small to moderate free fluid in the pelvis. Musculoskeletal: No acute bony abnormality. IMPRESSION: Interval placement of drainage catheter into the right perinephric space in the area of previously seen gas and fluid collection. Fluid collection has decreased since prior study. There is now contrast material seen within the perinephric space and extending into the right paracolic gutter. This is concerning for possible fistulous communication to the colon. May consider contrast injection through the drainage catheter under fluoroscopic guidance to assess for enteric fistula. Moderate bilateral pleural effusions with bibasilar atelectasis or consolidation, unchanged. Cholelithiasis. NG tube in the stomach. Aortic atherosclerosis. Small to moderate free fluid in the pelvis. Electronically Signed   By: Charlett Nose M.D.   On: 10/16/2020 17:21   DG Chest 1 View  Result Date: 10/16/2020 CLINICAL DATA:  Tachycardia EXAM: CHEST  1 VIEW COMPARISON:  10/13/2020 FINDINGS: Unchanged left basilar opacities. Hazy diffuse opacities are also unchanged. Esophageal catheter courses below the field of view. Cardiac silhouette size is normal. IMPRESSION: Unchanged left basilar opacities and hazy diffuse opacities bilaterally. Electronically Signed   By: Deatra Robinson M.D.   On: 10/16/2020 01:15   DG Chest Port 1 View  Result Date: 10/18/2020 CLINICAL DATA:  Acute respiratory distress EXAM: PORTABLE CHEST 1 VIEW COMPARISON:  10/16/2020 FINDINGS: NG tube is in the stomach. Moderate to large left effusion and small to moderate right effusion. Worsening airspace disease throughout the left lung. Continued right lower lobe atelectasis or infiltrate. Heart is normal size. IMPRESSION: Bilateral pleural effusions, left larger than right. Bilateral airspace disease, worsening on the left since prior study, atelectasis  versus infection. Electronically Signed   By: Charlett Nose M.D.   On: 10/18/2020 22:25   DG Abd Portable 1V  Result Date: 10/16/2020 CLINICAL DATA:  Nasogastric tube placement EXAM: PORTABLE ABDOMEN - 1 VIEW COMPARISON:  One day prior FINDINGS: Removal of feeding tube with placement of nasogastric tube. This terminates at the gastric body. Percutaneous drain projects over the right upper pelvis. Gas-filled small bowel loops at up to 2.9 cm throughout the left side of the abdomen. Minimal contrast within the ascending colon. IMPRESSION: Nasogastric tube terminating at the gastric body. Gas-filled upper normal sized small bowel loops could mild adynamic ileus or less likely low-grade partial small bowel obstruction. Electronically Signed   By: Jeronimo Greaves M.D.   On: 10/16/2020 13:29   [place] EKG SITE RITE  Result Date: 10/17/2020 If Site Rite image not attached, placement could not be confirmed due to current cardiac rhythm.   Labs:  CBC: Recent Labs    10/15/20 0408 10/15/20 0408 10/15/20 2158 10/16/20 0508 10/17/20 0920 10/18/20 0727  WBC 11.5*  --   --  12.2* 9.4 10.6*  HGB 10.4*   < > 11.4* 10.4* 10.1* 10.5*  HCT 29.6*   < > 32.1* 29.2* 29.9* 31.3*  PLT 254  --   --  263 243 253   < > = values in this interval not displayed.    COAGS: Recent Labs    10/14/2020 1246 10/11/20 1706  INR 0.9 1.1  APTT 25 41*    BMP: Recent Labs    10/16/20 0508 10/17/20 0920 10/18/20 0726 10/19/20 0032  NA 152* 147*  148* 148*  K 4.3 3.1* 3.6 3.6  CL 124* 119* 121* 121*  CO2 20* 19* 19* 20*  GLUCOSE 144* 158* 210* 130*  BUN 46* 33* 27* 26*  CALCIUM 7.4* 7.4* 7.3* 7.4*  CREATININE 1.32* 1.10 1.07 1.03  GFRNONAA 60* >60 >60 >60    LIVER FUNCTION TESTS: Recent Labs    10/11/20 1706 10/13/20 1009 10/15/20 0408 10/18/20 0726  BILITOT 1.3* 0.8 1.3* 1.1  AST 101* 105* 134* 38  ALT 45* 60* 70* 44  ALKPHOS 96 163* 200* 142*  PROT 5.3* 5.0* 5.1* 5.1*  ALBUMIN 1.6* 1.4* 1.3* 1.3*     Assessment and Plan:  Right retroperitoneal/perinephric abscess s/p right retroperitoneal drain placement in IR 10/14/2020.  Right retroperitoneal drain site stable with approximately 20 cc thick brown fluid in suction bulb (additional 295 cc output from drain in past 24 hours).   Continue with current drain management: Flush drain with NS each shift and document output. Plan for repeat CT when output is <10 cc/day. Additional plans per primary teams. IR will continue to follow.   Electronically Signed: Alwyn Ren, AGACNP-BC [phone] 10/19/2020, 12:49 PM   I spent a total of 15 Minutes at the the patient's bedside AND on the patient's hospital floor or unit, greater than 50% of which was counseling/coordinating care for right retroperitoneal/perinephric drain.

## 2020-10-19 NOTE — Progress Notes (Signed)
Pharmacy Antibiotic Note  Mark May is a 64 y.o. male admitted on 10/15/2020 with stroke.  Pharmacy has been consulted for Vancomycin/Cefepime dosing for ?HCAP. Known intra-abdominal abscess. Scr has improved.   Plan: Vancomycin 500 mg IV q12h Cefepime 2g IV q12h Trend WBC, temp, renal function  F/U infectious work-up Drug levels as indicated   Weight: 61.9 kg (136 lb 7.4 oz)  Temp (24hrs), Avg:99.2 F (37.3 C), Min:97.8 F (36.6 C), Max:102.4 F (39.1 C)  Recent Labs  Lab 10/12/20 0314 10/12/20 0453 10/13/20 1009 10/13/20 1009 10/14/20 0116 10/15/20 0408 10/16/20 0508 10/17/20 0920 10/18/20 0726 10/18/20 0727 10/18/20 2258  WBC   < >  --  9.4   < > 9.7 11.5* 12.2* 9.4  --  10.6*  --   CREATININE   < >  --  1.40*   < > 1.35* 1.40* 1.32* 1.10 1.07  --   --   LATICACIDVEN  --  1.6 1.8  --   --   --   --   --   --   --  1.6   < > = values in this interval not displayed.    Estimated Creatinine Clearance: 60.3 mL/min (by C-G formula based on SCr of 1.07 mg/dL).    No Known Allergies  Abran Duke, PharmD, BCPS Clinical Pharmacist Phone: [phone]

## 2020-10-19 NOTE — Progress Notes (Signed)
STROKE TEAM PROGRESS NOTE   INTERVAL HISTORY Daughter at the bedside. Pt had acute event last night with tachypnea, tachycardia, lethargy, found to have aspiration pneumonia with pleural effusion. He was transferred to ICU for observation. Currently on my rounding, pt AAO x 3, lethargic but not in distress, good communication although with moderate to severe dysarthria. TPN will be resumed. No fever.   OBJECTIVE Vitals:   10/18/20 2210 10/18/20 2315 10/19/20 0354 10/19/20 0500  BP:      Pulse:      Resp: (!) 38     Temp: (!) 102.4 F (39.1 C) (!) 102 F (38.9 C) 100 F (37.8 C)   TempSrc: Rectal Axillary Axillary   SpO2: 91%     Weight:    60.6 kg   CBC:  Recent Labs  Lab 10/17/20 0920 10/18/20 0727  WBC 9.4 10.6*  NEUTROABS  --  8.6*  HGB 10.1* 10.5*  HCT 29.9* 31.3*  MCV 87.2 87.9  PLT 243 253   Basic Metabolic Panel:  Recent Labs  Lab 10/18/20 0726 10/19/20 0032  NA 148* 148*  K 3.6 3.6  CL 121* 121*  CO2 19* 20*  GLUCOSE 210* 130*  BUN 27* 26*  CREATININE 1.07 1.03  CALCIUM 7.3* 7.4*  MG 2.2 2.2  PHOS 2.7 3.0   Lipid Panel:     Component Value Date/Time   CHOL 173 10/06/2020 0522   CHOL 216 (H) 03/09/2018 1620   TRIG 260 (H) 10/17/2020 0920   HDL 33 (L) 10/06/2020 0522   HDL 59 03/09/2018 1620   CHOLHDL 5.2 10/06/2020 0522   VLDL 55 (H) 10/06/2020 0522   LDLCALC 85 10/06/2020 0522   LDLCALC 136 (H) 03/09/2018 1620   HgbA1c:  Lab Results  Component Value Date   HGBA1C 5.8 (H) 10/06/2020   Urine Drug Screen: No results found for: LABOPIA, COCAINSCRNUR, LABBENZ, AMPHETMU, THCU, LABBARB  Alcohol Level [facility] results found for: [facility]  [facility]AGING  CT HEAD WO CONTRAST 10/20/2020 1. No acute abnormality.  2. Minimal diffuse cerebral and cerebellar atrophy.  3. Mild chronic small vessel white matter ischemic changes in both cerebral hemispheres.   CT Angio Head W or Wo Contrast CT Angio Neck W and/or Wo Contrast CT CEREBRAL PERFUSION W  CONTRAST 10/08/2020 1. 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  2. Right MCA infarct with extensive penumbra.  3. 4 mm right supraclinoid ICA aneurysm.  4. Widely patent cervical carotid and vertebral arteries.  5. Aortic Atherosclerosis (ICD10-I70.0).   CT HEAD WO CONTRAST 10/04/2020 Hypodensity in the right lateral temporal lobe now shows mild hemorrhage or contrast enhancement. This is most consistent with acute infarct. There has been interval stenting of the right middle cerebral artery.   CT HEAD WO CONTRAST 10/21/2020 Right temporal lobe hypodensity compatible with acute infarct. Decreased hyperdensity within this stroke compared with 4 hours previously compatible with contrast staining rather than hemorrhage. Small area of acute infarct in the right parietal lobe now evident. Right MCA stent.   MRI brain: 10/06/20 1. Fairly extensive patchy acute/early subacute infarcts within the right MCA/watershed territory.  2. Additional small acute/early subacute infarcts within the dorsal right thalamus and left basal ganglia. 3. Background moderate chronic small vessel ischemic disease.  MRA head: 10/06/20 1. Interval stenting of the M1/M2 right middle cerebral artery. Flow related signal is present proximal and distal to the stent suggestive of stent patency. 2. 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  DG  Abd 1 View 10/30/2020 Gastric catheter within the stomach kinked at the proximal side port. This could be withdrawn slightly S necessary.   Transthoracic Echocardiogram  10/06/2020 1. Left ventricular ejection fraction, by estimation, is 70 to 75%. The left ventricle has hyperdynamic function. The left ventricle has no regional wall motion abnormalities. Left ventricular diastolic parameters are consistent with Grade II diastolic dysfunction (pseudonormalization). Elevated left atrial pressure.  2. Right ventricular systolic  function is normal. The right ventricular size is normal.  3. The mitral valve is normal in structure. Mild mitral valve regurgitation. No evidence of mitral stenosis.  4. The aortic valve is normal in structure. Aortic valve regurgitation is not visualized. No aortic stenosis is present.  5. The inferior vena cava is dilated in size with <50% respiratory variability, suggesting right atrial pressure of 15 mmHg.  ECG - SR rate 70 BPM. (See cardiology reading for complete details)  DG Chest Port 1 View  Result Date: 10/18/2020 CLINICAL DATA:  Acute respiratory distress EXAM: PORTABLE CHEST 1 VIEW COMPARISON:  10/16/2020 FINDINGS: NG tube is in the stomach. Moderate to large left effusion and small to moderate right effusion. Worsening airspace disease throughout the left lung. Continued right lower lobe atelectasis or infiltrate. Heart is normal size. IMPRESSION: Bilateral pleural effusions, left larger than right. Bilateral airspace disease, worsening on the left since prior study, atelectasis versus infection. Electronically Signed   By: Charlett Nose M.D.   On: 10/18/2020 22:25    PHYSICAL EXAM    Temp:  [97.8 F (36.6 C)-102.4 F (39.1 C)] 100 F (37.8 C) (11/19 0354) Pulse Rate:  [80-103] 102 (11/18 2029) Resp:  [20-38] 38 (11/18 2210) BP: (159-175)/(77-93) 159/77 (11/18 2029) SpO2:  [91 %-96 %] 91 % (11/18 2210) Weight:  [60.6 kg] 60.6 kg (11/19 0500)  General - malnourished, well developed, not in acute distress, still has mild tachypena and exertional dysnea.  Ophthalmologic - fundi not visualized due to noncooperation.  Cardiovascular - Regular rhythm and rate.  Abdominal - soft, tenderness on palpation at epigastric region, drainage tube in place continues to drainge.   Neuro -  lethargic, but not in distress, orientated to self, age, time and people and place. Severe dysarthria, no aphasia but paucity of speech, able to name and repeat, follows simple commands. Tracking  bilaterally, seems to have intact visual field. PERRL, no gaze deviation or palsy. Left facial droop. Tongue midline. Left UE and LE flaccid. RUE at least 4/5 and RLE at least 3/5. Sensation symmetrical, FTN intact on the right and gait not tested.   ASSESSMENT/PLAN Mark May is a 64 y.o. male with history of hypertension, BPH, sciatica, stomach ulcer status post surgery in the past presented to ER at [facility] for acute onset left-sided weakness, left facial droop and left hemianopia.  CT head and neck showed right M1 high-grade stenosis versus near occlusive thrombosis.  CT perfusion showed large penumbra.  Patient transferred to [facility] for thrombectomy.  He did not receive IV t-PA due to late presentation (>4.5 hours from time of onset). IR - complete occlusion of RT MCA M 1 seg - thrombectomy ->TICI 3 revascularization with placement of rescue stent m4 mm x 24 mm Atlas for reocclusion with restoration of TICI 3 .  Stroke: R MCA infarct s/p IR TICI3 R M1 w/ stent placement - likely embolic due to new diagnosed afib  CT Head - No acute abnormality. Minimal diffuse cerebral and cerebellar atrophy.  CTA H&N - Severe distal right M1 stenosis or subocclusive thrombus. 4 mm right supraclinoid ICA aneurysm. Widely patent cervical carotid and vertebral arteries.   CTP - Right MCA infarct with extensive penumbra.   MRI head - Fairly extensive patchy acute/early subacute infarcts within the right MCA/watershed territory. Additional small acute/early subacute infarcts within the dorsal right thalamus and left basal ganglia.  MRA head -  Interval stenting of the M1/M2 right middle cerebral artery. Flow related signal is present proximal and distal to the stent suggestive of stent patency. 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  2D Echo - EF 70 - 75%. No cardiac source of emboli identified.   UE doppler neg DVT  Loyal Jacobson Virus 2 -  negative  LDL - 85  HgbA1c - 5.8  VTE prophylaxis - SCDs  No antithrombotic prior to admission, now on ASA 300 PR. Brilinta discontinued for bowel rest, no IV cargrelor due to high bleeding risk, heparin IV discontinued due to GIB.    Therapy recommendations:  CIR  Disposition:  Pending  Atrial Fibrillation w/ RVR, new diagnosis 11/11  CHA2DS2-VASc Score = at least 5,  ?2 oral anticoagulation recommended  Age in Years:  55-74   +1    Sex:  Male   0    Hypertension History:  yes   +1     Diabetes Mellitus:  0  Congestive Heart Failure History:  yes   +1   Vascular Disease History:  0     Stroke/TIA/Thromboembolism History:  yes   +2 . amio gtt -> amio po -> d/c for bowel rest  . On IV heparin re-started 11/14 -> stopped 11/15 due to GIB . 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 Bilateral airspace disease, worsening on the left since prior study, atelectasis versus infection.  Bedside US showed low volume pleural effusion  Continue Abx  CCM on board  AKI, resolved  Cre 1.48-1.69-1.96->1.61->1.40->1.35->1.40->1.32->1.10->1.07->1.03  Stopped lisinopril   On IVF with D5+0.2%NS @ 35 -> off  On TPN @ 40->80  Avoid nephrotoxins  UGIB Blood loss anemia  Hb  12.4->10.7->10.4->11.4->10.4->10.1->10.5  Stool occult blood positive  GI on board - signed off  Recommend PPI IV bid -> at discharge PPI po Bid for one month and then qd  Heparin IV discontinued  Hypertension Tachycardia  Home BP meds: Amlodipine  off Cleviprex  Stable . Stopped lisinopril d/t rising Cre . Long-term BP goal normotensive  Hyperlipidemia  Home Lipid lowering medication: none  LDL 85, goal < 70  CK 991->630->107  ALT/AST - 101/45->105/60->134/70->38/44  Consider statin once po access  Dysphagia . Secondary to stroke . on IVF @ 34 -> off . On TPN @ 40->80 . Speech on board . Now on bowel rest   Other Stroke Risk Factors  Advanced age  Other Active Problems  Aortic Atherosclerosis (ICD10-I70.0).   4 mm right supraclinoid ICA aneurysm.  Hypokalemia - 3.1 ->3.5->3.3 ->4.3->3.1->3.6->3.6  Hx of PUD s/p surgery 30 years ago  Hypernatremia Na 149->152->147->148->148  Hospital day # 14   Neurology will follow peripherally. Please feel free to call with questions.   Marvel Plan, MD PhD Stroke Neurology 10/19/2020 9:43 PM     to contact Stroke Continuity provider, please refer to [email or web address]. After hours, contact General Neurology

## 2020-10-19 NOTE — Progress Notes (Addendum)
NAME:  Mark May, MRN:  [id number], DOB:  05-Jun-1956, LOS: 14 ADMISSION DATE:  10/03/2020, CONSULTATION DATE: 10/06/2020 REFERRING MD: Dr. Curtis Sites, CHIEF COMPLAINT: Left-sided weakness  Brief History   64 year old male with hypertension and hyperlipidemia who presented with left-sided weakness, noted to have right MCA occlusion status post thrombectomy and stent placement 11/5.  Hospitalization complicated by hypoxic respiratory failure secondary to pulmonary edema requiring BiPAP and lasix.  Awaiting CIR placement however on the evening of 10/10, he was noted to become more lethargic with tachycardia and tachypnea.  Had complained of abdominal pain with some distention and chest pain which resolved after burping.  KUB obtained which was normal.  On 11/11, some what more responsive but now with increasing sCr in which lisinopril was stopped and development of fever 102.6  TRH initially consulted for help with medical management however on their evaluation, PCCM consulted given ill appearance and developing hypotension and new onset Afib with RVR found to have sepsis with RLL pneumonia likely due to aspiration started on zosyn. PCCM consulted for continue right abdominal pain and distension with concern for bowel perforation secondary to Cortrak.  Past Medical History  Hypertension Peptic ulcer disease Gout BPH Sciatica   Significant Hospital Events   11/5 Mechanical thrombectomy of right MCA and stent 11/5 11/11 PCCM reconsulted  11/12 Cortrak placed 11/17 0 ccm signed off 11/18 - ccm recalled by rapid response Consults:  IR Neurology PCCM 11/5- 11/8; 11/11; 11/16-11/17, 11/18 -  TRH  Procedures:  11/6 right femoral sheath was removed  ? Date RUE PICC  ? ddate  RLQ drain  Significant Diagnostic Tests:  11/5 CT head: No acute abnormality. Minimal diffuse cerebral and cerebellar atrophy. Mild chronic small vessel white matter ischemic changes in both cerebral  hemispheres  11/5 CTA head and neck: Severe distal right M1 stenosis or subocclusive thrombus, Right MCA infarct with extensive penumbra, 4 mm right supraclinoid ICA aneurysm. Widely patent cervical carotid and vertebral arteries.  11/5 postprocedure CT head: Hypodensity in the right lateral temporal lobe now shows mild hemorrhage or contrast enhancement. This is most consistent with acute infarct. There has been interval stenting of the right middle cerebral artery.  11/6 MRI brain: 1. Fairly extensive patchy acute/early subacute infarcts within the right MCA/watershed territory.  2. Additional small acute/early subacute infarcts within the dorsal right thalamus and left basal ganglia. 3. Background moderate chronic small vessel ischemic disease.  11/6  MRA head: 1. Interval stenting of the M1/M2 right middle cerebral artery. Flow related signal is present proximal and distal to the stent suggestive of stent patency. 2. 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  11/5 DG Abd 1 View Gastric catheter within the stomach kinked at the proximal side port. This could be withdrawn slightly S necessary.   11/6 Transthoracic Echocardiogram  1. Left ventricular ejection fraction, by estimation, is 70 to 75%. The left ventricle has hyperdynamic function. The left ventricle has no regional wall motion abnormalities. Left ventricular diastolic parameters are consistent with Grade II diastolic dysfunction (pseudonormalization). Elevated left atrial pressure.  2. Right ventricular systolic function is normal. The right ventricular size is normal.  3. The mitral valve is normal in structure. Mild mitral valve regurgitation. No evidence of mitral stenosis.  4. The aortic valve is normal in structure. Aortic valve regurgitation is not visualized. No aortic stenosis is present.  5. The inferior vena cava is dilated in size with <50% respiratory variability,  suggesting right  atrial pressure of 15 mmHg.  11/10 KUB >> normal gas pattern  11/13 CT abdomen and pelvis wo contrast 1. Large right perinephric fluid collection and pockets of air concerning for an infectious process/abscess. Clinical correlation is recommended. 2. Cholelithiasis. 3. Nonobstructing right renal calculi. No hydronephrosis. 4. Colonic diverticulosis. No bowel obstruction. 5. Partially visualized small bilateral pleural effusions with complete consolidative changes of the visualized lower lobes. 6. Aortic Atherosclerosis (ICD10-I70.0).  11/16 CT A/P IMPRESSION: Interval placement of drainage catheter into the right perinephric space in the area of previously seen gas and fluid collection. Fluid collection has decreased since prior study. There is now contrast material seen within the perinephric space and extending into the right paracolic gutter. This is concerning for possible fistulous communication to the colon. May consider contrast injection through the drainage catheter under fluoroscopic guidance to assess for enteric fistula. Moderate bilateral pleural effusions with bibasilar atelectasis or consolidation, unchanged. Cholelithiasis. NG tube in the stomach. Aortic atherosclerosis. Small to moderate free fluid in the pelvis.  Micro Data:  11/5 MRSA PCR negative 11/10 UC >> 11/11 BCx2 >>  Antimicrobials:  11/5 cefazolin - 11/6 11/11 Zosyn >11/17 11/11 Flagyl - 11/11 11/11 cefepime - 11/11 11/15 Vancomycin - 11/17 11/17  Unasyn /11/18 11/17 diflucan  xxx 11/18 - vanc 11/18 cefepime    Interim history/subjective:   Transferred yesterday for respiratory distress  Objective   Blood pressure (!) 154/88, pulse (!) 105, temperature 99.6 F (37.6 C), temperature source Axillary, resp. rate (!) 35, weight 60.6 kg, SpO2 94 %.        Intake/Output Summary (Last 24 hours) at 10/19/2020 0938 Last data filed at 10/19/2020 0622 Gross per 24 hour   Intake 1334.59 ml  Output 2495 ml  Net -1160.41 ml   Filed Weights   10/17/20 1127 10/18/20 0500 10/19/20 0500  Weight: 62.9 kg 61.9 kg 60.6 kg   Physical Exam: General: Chronically ill-appearing thin maile, no acute distress HENT: [place], AT, OP clear, MMM Eyes: EOMI, no scleral icterus Respiratory: Clear to auscultation bilaterally.  No crackles, wheezing or rales Cardiovascular: RRR, -M/R/G, no JVD GI: BS+, soft, nontender, right JP drain with serosanguinous fluid Extremities:-Edema,-tenderness Neuro: Awake, alert, follows commands, left hemiplegia  Psych: Depressed mood, normal affect  Assessment & Plan:   Acute respiratory failure secondary to aspiration pneumonia with bilateral pleural effusion - Will ultrasound and consider diagnostic/therapeutic thoracentesis - Wean supplemental O2 for goal SpO2> 88% - Aspiration precautions - High risk for intubation   Sepsis due to right lower lobe pneumonia likely aspiration and intraabdominal abscess (klebsiella and candida) in setting of bowel perf - Discontinue Vanc - Continue coverage for pneumonia and abdominal abscess in setting of bowel perf - Needs Dentition consult at some point  Intra-abdominal abscess, contained bowel perforation- superior-most edge lines up with distal end of cortrak Plan  - hold TF for 10/18/20 onwards and reassess after resp distress cleared - CCS following. Plan for repeat imaging next week if persistent leak still present. 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 Continue TPN Pain/Anxiety/Delirium protocol (if indicated): na VAP protocol (if indicated): Needs DENTAL consult. Oral care protocol DVT prophylaxis: scd - need to ask neuro about heparin/locenox GI prophylaxis: ppi Glucose control: ssi Mobility: bed rest Code Status: full Family Communication: Updated patient and son at bedside Disposition: Remain in ICU  LABS    PULMONARY Recent Labs  Lab 10/16/20 0012 10/18/20 2208  PHART 7.495* 7.532*  PCO2ART 26.7* 22.4*  PO2ART 76.5* 56.7*  HCO3 20.4 19.0*  O2SAT 95.1 92.3    CBC Recent Labs  Lab 10/16/20 0508 10/17/20 0920 10/18/20 0727  HGB 10.4* 10.1* 10.5*  HCT 29.2* 29.9* 31.3*  WBC 12.2* 9.4 10.6*  PLT 263 243 253    COAGULATION No results for input(s): INR in the last 168 hours.  CARDIAC  No results for input(s): TROPONINI in the last 168 hours. No results for input(s): PROBNP in the last 168 hours.   CHEMISTRY Recent Labs  Lab 10/15/20 0408 10/15/20 1630 10/16/20 0508 10/16/20 0508 10/16/20 1652 10/17/20 0920 10/17/20 0920 10/18/20 0726 10/19/20 0032  NA 149*  --  152*  --   --  147*  --  148* 148*  K 3.3*  --  4.3   < >  --  3.1*   < > 3.6 3.6  CL 116*  --  124*  --   --  119*  --  121* 121*  CO2 20*  --  20*  --   --  19*  --  19* 20*  GLUCOSE 148*  --  144*  --   --  158*  --  210* 130*  BUN 45*  --  46*  --   --  33*  --  27* 26*  CREATININE 1.40*  --  1.32*  --   --  1.10  --  1.07 1.03  CALCIUM 7.5*  --  7.4*  --   --  7.4*  --  7.3* 7.4*  MG  --    < > 2.4  --  2.4 2.2  --  2.2 2.2  PHOS  --    < > 2.9  --  3.9 4.0  --  2.7 3.0   < > = values in this interval not displayed.   Estimated Creatinine Clearance: 61.3 mL/min (by C-G formula based on SCr of 1.03 mg/dL).   LIVER Recent Labs  Lab 10/13/20 1009 10/15/20 0408 10/18/20 0726  AST 105* 134*  38  ALT 60* 70* 44  ALKPHOS 163* 200* 142*  BILITOT 0.8 1.3* 1.1  PROT 5.0* 5.1* 5.1*  ALBUMIN 1.4* 1.3* 1.3*     INFECTIOUS Recent Labs  Lab 10/13/20 1009 10/18/20 2258 10/19/20 0032  LATICACIDVEN 1.8 1.6 1.5  PROCALCITON  --   --  1.38     ENDOCRINE CBG (last 3)  Recent Labs    10/19/20 0028 10/19/20 0348 10/19/20 0817  GLUCAP 123* 119* 130*     IMAGING x48h  - image(s) personally visualized  -   highlighted in bold DG Chest Port 1 View  Result Date: 10/18/2020 CLINICAL DATA:  Acute respiratory distress EXAM: PORTABLE CHEST 1 VIEW COMPARISON:  10/16/2020 FINDINGS: NG tube is in the stomach. Moderate to large left effusion and small to moderate right effusion. Worsening airspace disease throughout the left lung. Continued right lower lobe atelectasis or infiltrate. Heart is normal  size. IMPRESSION: Bilateral pleural effusions, left larger than right. Bilateral airspace disease, worsening on the left since prior study, atelectasis versus infection. Electronically Signed   By: Charlett Nose M.D.   On: 10/18/2020 22:25   The patient is critically ill with multiple organ systems failure and requires high complexity decision making for assessment and support, frequent evaluation and titration of therapies, application of advanced monitoring technologies and extensive interpretation of multiple databases.  Independent Critical Care Time: 35 Minutes.   Mechele Collin, M.D. [facility] Pulmonary/Critical Care Medicine 10/19/2020 9:38 AM   Please see Amion for pager number to reach on-call Pulmonary and Critical Care Team.

## 2020-10-19 NOTE — Progress Notes (Signed)
eLink Physician-Brief Progress Note Patient Name: Mark May DOB: Apr 06, 1956 MRN: [id number]   Date of Service  10/19/2020  HPI/Events of Note  Patient transferred to ICU from floor secondary to tachypnea and fever, and new left lung infiltrates suggestive of HCAP  r/o recurrent aspiration, patient is at high risk for requiring intubation but at this time respiratory rate is 29 and saturation on the monitor is 97 %.  eICU Interventions  Vancomycin / Zosyn for HCAP, NPO for now, monitor respiratory status closely, aggressive Rx of fever.        Thomasene Lot Valentina Alcoser 10/19/2020, 12:19 AM

## 2020-10-19 NOTE — Progress Notes (Signed)
Inpatient Rehabilitation-Admissions Coordinator   CIR consult order DC'd during pt's transfer to higher level of care.   Please re-consult CIR when medically appropriate.   Cheri Rous, OTR/L  Rehab Admissions Coordinator  [phone] 10/19/2020 3:47 PM

## 2020-10-19 NOTE — Progress Notes (Signed)
Pharmacy Antibiotic Note  Mark May is a 64 y.o. male admitted on 10/16/2020 with stroke.  Pharmacy has been consulted for Zosyn and fluconazole dosing for HCAP coverage and abdominal abscess in setting of bowel perf. Scr has improved. Tmax 102.4, WBC 10.6.  Plan: Zosyn 3.375g IV every 8 hours Fluconazole 400mg  IV every 24 hours Monitor C&S, renal function, and clinical status  Weight: 60.6 kg (133 lb 9.6 oz)  Temp (24hrs), Avg:99.8 F (37.7 C), Min:98.2 F (36.8 C), Max:102.4 F (39.1 C)  Recent Labs  Lab 10/13/20 1009 10/13/20 1009 10/14/20 0116 10/14/20 0116 10/15/20 0408 10/16/20 0508 10/17/20 0920 10/18/20 0726 10/18/20 0727 10/18/20 2258 10/19/20 0032  WBC 9.4   < > 9.7  --  11.5* 12.2* 9.4  --  10.6*  --   --   CREATININE 1.40*   < > 1.35*   < > 1.40* 1.32* 1.10 1.07  --   --  1.03  LATICACIDVEN 1.8  --   --   --   --   --   --   --   --  1.6 1.5   < > = values in this interval not displayed.    Estimated Creatinine Clearance: 61.3 mL/min (by C-G formula based on SCr of 1.03 mg/dL).    No Known Allergies  10/21/20, PharmD PGY1 Acute Care Pharmacy Resident Phone: [phone] 10/19/2020 11:54 AM  Please check [email or web address] for unit specific pharmacy phone numbers.

## 2020-10-19 NOTE — Plan of Care (Signed)
  Problem: Education: Goal: Knowledge of General Education information will improve Description: Including pain rating scale, medication(s)/side effects and non-pharmacologic comfort measures Outcome: Progressing   Problem: Health Behavior/Discharge Planning: Goal: Ability to manage health-related needs will improve Outcome: Progressing   Problem: Clinical Measurements: Goal: Ability to maintain clinical measurements within normal limits will improve Outcome: Progressing Goal: Will remain free from infection Outcome: Progressing Goal: Diagnostic test results will improve Outcome: Progressing Goal: Cardiovascular complication will be avoided Outcome: Progressing   Problem: Activity: Goal: Risk for activity intolerance will decrease Outcome: Progressing   Problem: Coping: Goal: Level of anxiety will decrease Outcome: Progressing   Problem: Elimination: Goal: Will not experience complications related to bowel motility Outcome: Progressing   Problem: Pain Managment: Goal: General experience of comfort will improve Outcome: Progressing   Problem: Safety: Goal: Ability to remain free from injury will improve Outcome: Progressing   Problem: Skin Integrity: Goal: Risk for impaired skin integrity will decrease Outcome: Progressing   Problem: Education: Goal: Knowledge of disease or condition will improve Outcome: Progressing Goal: Knowledge of secondary prevention will improve Outcome: Progressing Goal: Knowledge of patient specific risk factors addressed and post discharge goals established will improve Outcome: Progressing Goal: Individualized Educational Video(s) Outcome: Progressing   Problem: Coping: Goal: Will verbalize positive feelings about self Outcome: Progressing Goal: Will identify appropriate support needs Outcome: Progressing   Problem: Health Behavior/Discharge Planning: Goal: Ability to manage health-related needs will improve Outcome:  Progressing   Problem: Self-Care: Goal: Ability to participate in self-care as condition permits will improve Outcome: Progressing Goal: Verbalization of feelings and concerns over difficulty with self-care will improve Outcome: Progressing Goal: Ability to communicate needs accurately will improve Outcome: Progressing   Problem: Nutrition: Goal: Risk of aspiration will decrease Outcome: Progressing   Problem: Ischemic Stroke/TIA Tissue Perfusion: Goal: Complications of ischemic stroke/TIA will be minimized Outcome: Progressing   Problem: Fluid Volume: Goal: Hemodynamic stability will improve Outcome: Progressing   Problem: Clinical Measurements: Goal: Diagnostic test results will improve Outcome: Progressing Goal: Signs and symptoms of infection will decrease Outcome: Progressing   Problem: Respiratory: Goal: Ability to maintain adequate ventilation will improve Outcome: Progressing

## 2020-10-20 ENCOUNTER — Inpatient Hospital Stay (HOSPITAL_COMMUNITY): Payer: 59

## 2020-10-20 ENCOUNTER — Inpatient Hospital Stay (HOSPITAL_COMMUNITY): Payer: 59 | Admitting: Certified Registered Nurse Anesthetist

## 2020-10-20 DIAGNOSIS — I6601 Occlusion and stenosis of right middle cerebral artery: Secondary | ICD-10-CM | POA: Diagnosis not present

## 2020-10-20 LAB — POCT I-STAT 7, (LYTES, BLD GAS, ICA,H+H)
Acid-base deficit: 5 mmol/L — ABNORMAL HIGH (ref 0.0–2.0)
Bicarbonate: 21.1 mmol/L (ref 20.0–28.0)
Calcium, Ion: 1.17 mmol/L (ref 1.15–1.40)
HCT: 39 % (ref 39.0–52.0)
Hemoglobin: 13.3 g/dL (ref 13.0–17.0)
O2 Saturation: 94 %
Patient temperature: 98.6
Potassium: 3.9 mmol/L (ref 3.5–5.1)
Sodium: 149 mmol/L — ABNORMAL HIGH (ref 135–145)
TCO2: 22 mmol/L (ref 22–32)
pCO2 arterial: 42.6 mmHg (ref 32.0–48.0)
pH, Arterial: 7.302 — ABNORMAL LOW (ref 7.350–7.450)
pO2, Arterial: 76 mmHg — ABNORMAL LOW (ref 83.0–108.0)

## 2020-10-20 LAB — PROTEIN, PLEURAL OR PERITONEAL FLUID: Total protein, fluid: 3.1 g/dL

## 2020-10-20 LAB — LACTATE DEHYDROGENASE, PLEURAL OR PERITONEAL FLUID: LD, Fluid: 2853 U/L — ABNORMAL HIGH (ref 3–23)

## 2020-10-20 LAB — BASIC METABOLIC PANEL
Anion gap: 8 (ref 5–15)
BUN: 35 mg/dL — ABNORMAL HIGH (ref 8–23)
CO2: 19 mmol/L — ABNORMAL LOW (ref 22–32)
Calcium: 7.5 mg/dL — ABNORMAL LOW (ref 8.9–10.3)
Chloride: 119 mmol/L — ABNORMAL HIGH (ref 98–111)
Creatinine, Ser: 1.2 mg/dL (ref 0.61–1.24)
GFR, Estimated: >60 mL/min (ref >60)
Glucose, Bld: 341 mg/dL — ABNORMAL HIGH (ref 70–99)
Potassium: 4 mmol/L (ref 3.5–5.1)
Sodium: 146 mmol/L — ABNORMAL HIGH (ref 135–145)

## 2020-10-20 LAB — GLUCOSE, PLEURAL OR PERITONEAL FLUID: Glucose, Fluid: <20 mg/dL

## 2020-10-20 LAB — BODY FLUID CELL COUNT WITH DIFFERENTIAL
Lymphs, Fluid: 1 %
Monocyte-Macrophage-Serous Fluid: 3 % — ABNORMAL LOW (ref 50–90)
Neutrophil Count, Fluid: 96 % — ABNORMAL HIGH (ref 0–25)
Total Nucleated Cell Count, Fluid: 15750 cu mm — ABNORMAL HIGH (ref 0–1000)

## 2020-10-20 LAB — MAGNESIUM: Magnesium: 2.1 mg/dL (ref 1.7–2.4)

## 2020-10-20 LAB — TRIGLYCERIDES: Triglycerides: 379 mg/dL — ABNORMAL HIGH (ref <150)

## 2020-10-20 LAB — HEMOGLOBIN AND HEMATOCRIT, BLOOD
HCT: 19.1 % — ABNORMAL LOW (ref 39.0–52.0)
Hemoglobin: 6.1 g/dL — CL (ref 13.0–17.0)

## 2020-10-20 LAB — GLUCOSE, CAPILLARY
Glucose-Capillary: 304 mg/dL — ABNORMAL HIGH (ref 70–99)
Glucose-Capillary: 232 mg/dL — ABNORMAL HIGH (ref 70–99)
Glucose-Capillary: 198 mg/dL — ABNORMAL HIGH (ref 70–99)
Glucose-Capillary: 217 mg/dL — ABNORMAL HIGH (ref 70–99)
Glucose-Capillary: 209 mg/dL — ABNORMAL HIGH (ref 70–99)

## 2020-10-20 LAB — PROCALCITONIN: Procalcitonin: 1.96 ng/mL

## 2020-10-20 LAB — PHOSPHORUS: Phosphorus: 3.7 mg/dL (ref 2.5–4.6)

## 2020-10-20 LAB — AMYLASE, PLEURAL OR PERITONEAL FLUID: Amylase, Fluid: 220 U/L

## 2020-10-20 MED ORDER — FENTANYL 2500MCG IN NS 250ML (10MCG/ML) PREMIX INFUSION
0.0000 ug/h | INTRAVENOUS | Status: DC
  Administered 2020-10-20: 25 ug/h via INTRAVENOUS
  Administered 2020-10-21: 250 ug/h via INTRAVENOUS
  Administered 2020-10-21: 150 ug/h via INTRAVENOUS
  Administered 2020-10-22: 300 ug/h via INTRAVENOUS
  Administered 2020-10-22: 350 ug/h via INTRAVENOUS
  Administered 2020-10-22: 250 ug/h via INTRAVENOUS
  Administered 2020-10-23: 200 ug/h via INTRAVENOUS
  Filled 2020-10-20 (×7): qty 250

## 2020-10-20 MED ORDER — SODIUM CHLORIDE 0.9% IV SOLUTION: Freq: Once | INTRAVENOUS | Status: AC

## 2020-10-20 MED ORDER — LABETALOL HCL 5 MG/ML IV SOLN: 10.0000 mg | INTRAVENOUS | Status: DC | PRN

## 2020-10-20 MED ORDER — PROPOFOL 1000 MG/100ML IV EMUL
INTRAVENOUS | Status: AC
  Filled 2020-10-20: qty 100

## 2020-10-20 MED ORDER — SODIUM CHLORIDE 0.9 % IV BOLUS
1000.0000 mL | Freq: Once | INTRAVENOUS | Status: AC
  Administered 2020-10-20: 1000 mL via INTRAVENOUS

## 2020-10-20 MED ORDER — ETOMIDATE 2 MG/ML IV SOLN
INTRAVENOUS | Status: AC
  Filled 2020-10-20: qty 20

## 2020-10-20 MED ORDER — MIDAZOLAM HCL 2 MG/2ML IJ SOLN
INTRAMUSCULAR | Status: AC
  Filled 2020-10-20: qty 4

## 2020-10-20 MED ORDER — TRAVASOL 10 % IV SOLN
INTRAVENOUS | Status: DC
  Filled 2020-10-20: qty 1094.4

## 2020-10-20 MED ORDER — ROCURONIUM BROMIDE 10 MG/ML (PF) SYRINGE
PREFILLED_SYRINGE | INTRAVENOUS | Status: AC
  Administered 2020-10-20: 50 mg
  Filled 2020-10-20: qty 10

## 2020-10-20 MED ORDER — PROPOFOL 10 MG/ML IV BOLUS
INTRAVENOUS | Status: DC | PRN
  Administered 2020-10-20: 120 mg via INTRAVENOUS

## 2020-10-20 MED ORDER — FUROSEMIDE 10 MG/ML IJ SOLN
40.0000 mg | Freq: Once | INTRAMUSCULAR | Status: AC
  Administered 2020-10-20: 40 mg via INTRAVENOUS
  Filled 2020-10-20: qty 4

## 2020-10-20 MED ORDER — FENTANYL CITRATE (PF) 100 MCG/2ML IJ SOLN
25.0000 ug | INTRAMUSCULAR | Status: DC | PRN
  Administered 2020-10-20 (×2): 100 ug via INTRAVENOUS
  Administered 2020-10-20: 50 ug via INTRAVENOUS
  Administered 2020-10-20: 25 ug via INTRAVENOUS
  Administered 2020-10-22: 50 ug via INTRAVENOUS
  Filled 2020-10-20: qty 2

## 2020-10-20 MED ORDER — CHLORHEXIDINE GLUCONATE 0.12% ORAL RINSE (MEDLINE KIT)
15.0000 mL | Freq: Two times a day (BID) | OROMUCOSAL | Status: DC
  Administered 2020-10-20 – 2020-10-26 (×14): 15 mL via OROMUCOSAL

## 2020-10-20 MED ORDER — PROPOFOL 1000 MG/100ML IV EMUL
5.0000 ug/kg/min | INTRAVENOUS | Status: DC
  Administered 2020-10-20: 5 ug/kg/min via INTRAVENOUS

## 2020-10-20 MED ORDER — FENTANYL CITRATE (PF) 100 MCG/2ML IJ SOLN
INTRAMUSCULAR | Status: AC
  Filled 2020-10-20: qty 2

## 2020-10-20 MED ORDER — PHENYLEPHRINE HCL-NACL 10-0.9 MG/250ML-% IV SOLN
0.0000 ug/min | INTRAVENOUS | Status: DC
  Administered 2020-10-20: 13.333 ug/min via INTRAVENOUS
  Administered 2020-10-20: 170 ug/min via INTRAVENOUS
  Administered 2020-10-20: 150 ug/min via INTRAVENOUS
  Administered 2020-10-21 (×3): 50 ug/min via INTRAVENOUS
  Administered 2020-10-21: 180 ug/min via INTRAVENOUS
  Administered 2020-10-21: 50 ug/min via INTRAVENOUS
  Administered 2020-10-21: 200 ug/min via INTRAVENOUS
  Administered 2020-10-21: 150 ug/min via INTRAVENOUS
  Administered 2020-10-21: 200 ug/min via INTRAVENOUS
  Administered 2020-10-22: 50 ug/min via INTRAVENOUS
  Administered 2020-10-22: 60 ug/min via INTRAVENOUS
  Administered 2020-10-22: 80 ug/min via INTRAVENOUS
  Filled 2020-10-20: qty 250
  Filled 2020-10-20: qty 500
  Filled 2020-10-20 (×6): qty 250
  Filled 2020-10-20: qty 500
  Filled 2020-10-20 (×6): qty 250
  Filled 2020-10-20: qty 500

## 2020-10-20 MED ORDER — ORAL CARE MOUTH RINSE
15.0000 mL | OROMUCOSAL | Status: DC
  Administered 2020-10-20 – 2020-10-27 (×66): 15 mL via OROMUCOSAL

## 2020-10-20 MED ORDER — NICARDIPINE HCL IN NACL 20-0.86 MG/200ML-% IV SOLN
0.0000 mg/h | INTRAVENOUS | Status: DC
  Administered 2020-10-20: 5 mg/h via INTRAVENOUS
  Filled 2020-10-20 (×2): qty 200

## 2020-10-20 MED ORDER — SUCCINYLCHOLINE CHLORIDE 20 MG/ML IJ SOLN
INTRAMUSCULAR | Status: DC | PRN
  Administered 2020-10-20: 80 mg via INTRAVENOUS

## 2020-10-20 MED ORDER — ALBUMIN HUMAN 25 % IV SOLN
25.0000 g | Freq: Once | INTRAVENOUS | Status: AC
  Administered 2020-10-20: 25 g via INTRAVENOUS
  Filled 2020-10-20: qty 100

## 2020-10-20 NOTE — Progress Notes (Signed)
NAME:  Mark May, MRN:  [id number], DOB:  1956-08-01, LOS: 15 ADMISSION DATE:  10/04/2020, CONSULTATION DATE: 10/12/2020 REFERRING MD: Dr. Curtis Sites, CHIEF COMPLAINT: Left-sided weakness  Brief History   64 year old male with hypertension and hyperlipidemia who presented with left-sided weakness, noted to have right MCA occlusion status post thrombectomy and stent placement 11/5.  Hospitalization complicated by hypoxic respiratory failure secondary to pulmonary edema requiring BiPAP and lasix.  Awaiting CIR placement however on the evening of 10/10, he was noted to become more lethargic with tachycardia and tachypnea.  Had complained of abdominal pain with some distention and chest pain which resolved after burping.  KUB obtained which was normal.  On 11/11, some what more responsive but now with increasing sCr in which lisinopril was stopped and development of fever 102.6  TRH initially consulted for help with medical management however on their evaluation, PCCM consulted given ill appearance and developing hypotension and new onset Afib with RVR found to have sepsis with RLL pneumonia likely due to aspiration started on zosyn. PCCM consulted for continue right abdominal pain and distension with concern for bowel perforation secondary to Cortrak.  Past Medical History  Hypertension Peptic ulcer disease Gout BPH Sciatica   Significant Hospital Events   11/5 Mechanical thrombectomy of right MCA and stent 11/5 11/11 PCCM reconsulted  11/12 Cortrak placed 11/17 0 ccm signed off 11/18 - ccm recalled by rapid response Consults:  IR Neurology PCCM 11/5- 11/8; 11/11; 11/16-11/17, 11/18 -  TRH  Procedures:  11/6 right femoral sheath was removed  ? Date RUE PICC  ? ddate  RLQ drain  Significant Diagnostic Tests:  11/5 CT head: No acute abnormality. Minimal diffuse cerebral and cerebellar atrophy. Mild chronic small vessel white matter ischemic changes in both cerebral  hemispheres  11/5 CTA head and neck: Severe distal right M1 stenosis or subocclusive thrombus, Right MCA infarct with extensive penumbra, 4 mm right supraclinoid ICA aneurysm. Widely patent cervical carotid and vertebral arteries.  11/5 postprocedure CT head: Hypodensity in the right lateral temporal lobe now shows mild hemorrhage or contrast enhancement. This is most consistent with acute infarct. There has been interval stenting of the right middle cerebral artery.  11/6 MRI brain: 1. Fairly extensive patchy acute/early subacute infarcts within the right MCA/watershed territory.  2. Additional small acute/early subacute infarcts within the dorsal right thalamus and left basal ganglia. 3. Background moderate chronic small vessel ischemic disease.  11/6  MRA head: 1. Interval stenting of the M1/M2 right middle cerebral artery. Flow related signal is present proximal and distal to the stent suggestive of stent patency. 2. 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  11/5 DG Abd 1 View Gastric catheter within the stomach kinked at the proximal side port. This could be withdrawn slightly S necessary.   11/6 Transthoracic Echocardiogram  1. Left ventricular ejection fraction, by estimation, is 70 to 75%. The left ventricle has hyperdynamic function. The left ventricle has no regional wall motion abnormalities. Left ventricular diastolic parameters are consistent with Grade II diastolic dysfunction (pseudonormalization). Elevated left atrial pressure.  2. Right ventricular systolic function is normal. The right ventricular size is normal.  3. The mitral valve is normal in structure. Mild mitral valve regurgitation. No evidence of mitral stenosis.  4. The aortic valve is normal in structure. Aortic valve regurgitation is not visualized. No aortic stenosis is present.  5. The inferior vena cava is dilated in size with <50% respiratory variability,  suggesting right  atrial pressure of 15 mmHg.  11/10 KUB >> normal gas pattern  11/13 CT abdomen and pelvis wo contrast 1. Large right perinephric fluid collection and pockets of air concerning for an infectious process/abscess. Clinical correlation is recommended. 2. Cholelithiasis. 3. Nonobstructing right renal calculi. No hydronephrosis. 4. Colonic diverticulosis. No bowel obstruction. 5. Partially visualized small bilateral pleural effusions with complete consolidative changes of the visualized lower lobes. 6. Aortic Atherosclerosis (ICD10-I70.0).  11/16 CT A/P IMPRESSION: Interval placement of drainage catheter into the right perinephric space in the area of previously seen gas and fluid collection. Fluid collection has decreased since prior study. There is now contrast material seen within the perinephric space and extending into the right paracolic gutter. This is concerning for possible fistulous communication to the colon. May consider contrast injection through the drainage catheter under fluoroscopic guidance to assess for enteric fistula. Moderate bilateral pleural effusions with bibasilar atelectasis or consolidation, unchanged. Cholelithiasis. NG tube in the stomach. Aortic atherosclerosis. Small to moderate free fluid in the pelvis.  Micro Data:  11/5 MRSA PCR negative 11/10 UC >> 11/11 BCx2 >>  Antimicrobials:  11/5 cefazolin - 11/6 11/11 Zosyn >11/17 11/11 Flagyl - 11/11 11/11 cefepime - 11/11 11/15 Vancomycin - 11/17 11/17  Unasyn /11/18 11/17 diflucan  xxx 11/18 - vanc 11/18 cefepime    Interim history/subjective:   Intubated overnight  Objective   Blood pressure 125/71, pulse 88, temperature 98.4 F (36.9 C), temperature source Axillary, resp. rate 19, weight 56.1 kg, SpO2 100 %.    Vent Mode: PRVC FiO2 (%):  [30 %-60 %] 30 % Set Rate:  [15 bmp] 15 bmp Vt Set:  [520 mL] 520 mL PEEP:  [5 cmH20] 5 cmH20 Plateau Pressure:  [15 cmH20-30  cmH20] 15 cmH20   Intake/Output Summary (Last 24 hours) at 10/20/2020 1742 Last data filed at 10/20/2020 1430 Gross per 24 hour  Intake 1929.58 ml  Output 1240 ml  Net 689.58 ml   Filed Weights   10/18/20 0500 10/19/20 0500 10/20/20 0500  Weight: 61.9 kg 60.6 kg 56.1 kg   Physical Exam General:  In bed on vent HENT: NCAT ETT in place PULM: Diminished left basilar breath sounds, vent supported breathing CV: RRR, no mgr, JP drain with opaque drainage GI: BS+, soft, nontender MSK: normal bulk and tone Neuro: sedated on vent   Assessment & Plan:   Acute respiratory failure secondary to aspiration pneumonia with bilateral pleural effusion - S/p thoracentesis 11/20 - Full vent support - Wean PEEP and FIO2 - Gentle diuresis  Sepsis due to right lower lobe pneumonia likely aspiration and intraabdominal abscess (klebsiella and candida) in setting of bowel perf - Continue coverage for pneumonia and abdominal abscess in setting of bowel perf - Needs Dentition consult at some point  Intra-abdominal abscess, contained bowel perforation- superior-most edge lines up with distal end of cortrak Plan  - hold TF for 10/18/20 onwards and reassess after resp distress cleared - CCS following. Plan for repeat imaging next week if persistent leak still present. 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 Continue TPN Pain/Anxiety/Delirium protocol (if indicated): na VAP protocol (if  indicated): Needs DENTAL consult. Oral care protocol DVT prophylaxis: scd - need to ask neuro about heparin/locenox GI prophylaxis: ppi Glucose control: ssi Mobility: bed rest Code Status: full Family Communication: Updated son at bedside Disposition: Remain in ICU  LABS    PULMONARY Recent Labs  Lab 10/16/20 0012 10/18/20 2208 10/20/20 0359  PHART 7.495* 7.532* 7.302*  PCO2ART 26.7* 22.4* 42.6  PO2ART 76.5* 56.7* 76*  HCO3 20.4 19.0* 21.1  TCO2  --   --  22  O2SAT 95.1 92.3 94.0    CBC Recent Labs  Lab 10/16/20 0508 10/16/20 0508 10/17/20 0920 10/18/20 0727 10/20/20 0359  HGB 10.4*   < > 10.1* 10.5* 13.3  HCT 29.2*   < > 29.9* 31.3* 39.0  WBC 12.2*  --  9.4 10.6*  --   PLT 263  --  243 253  --    < > = values in this interval not displayed.    COAGULATION No results for input(s): INR in the last 168 hours.  CARDIAC  No results for input(s): TROPONINI in the last 168 hours. No results for input(s): PROBNP in the last 168 hours.   CHEMISTRY Recent Labs  Lab 10/16/20 0508 10/16/20 0508 10/16/20 1652 10/17/20 0920 10/17/20 0920 10/18/20 0726 10/18/20 0726 10/19/20 0032 10/19/20 0032 10/20/20 0359 10/20/20 0632  NA 152*   < >  --  147*  --  148*  --  148*  --  149* 146*  K 4.3   < >  --  3.1*   < > 3.6   < > 3.6   < > 3.9 4.0  CL 124*  --   --  119*  --  121*  --  121*  --   --  119*  CO2 20*  --   --  19*  --  19*  --  20*  --   --  19*  GLUCOSE 144*  --   --  158*  --  210*  --  130*  --   --  341*  BUN 46*  --   --  33*  --  27*  --  26*  --   --  35*  CREATININE 1.32*  --   --  1.10  --  1.07  --  1.03  --   --  1.20  CALCIUM 7.4*  --   --  7.4*  --  7.3*  --  7.4*  --   --  7.5*  MG 2.4   < > 2.4 2.2  --  2.2  --  2.2  --   --  2.1  PHOS 2.9   < > 3.9 4.0  --  2.7  --  3.0  --   --  3.7   < > = values in this interval not displayed.   Estimated Creatinine Clearance: 48.7 mL/min (by C-G formula based on SCr of 1.2 mg/dL).   LIVER Recent  Labs  Lab 10/15/20 0408 10/18/20 0726  AST 134* 38  ALT 70* 44  ALKPHOS 200* 142*  BILITOT 1.3* 1.1  PROT 5.1* 5.1*  ALBUMIN 1.3* 1.3*     INFECTIOUS Recent Labs  Lab 10/18/20 2258 10/19/20 0032 10/20/20 7494  LATICACIDVEN 1.6 1.5  --   PROCALCITON  --  1.38 1.96     ENDOCRINE CBG (last  3)  Recent Labs    10/20/20 0829 10/20/20 1227 10/20/20 1616  GLUCAP 232* 198* 217*     IMAGING x48h  - image(s) personally visualized  -   highlighted in bold DG CHEST PORT 1 VIEW  Result Date: 10/20/2020 CLINICAL DATA:  Respiratory distress.  Intubation. EXAM: PORTABLE CHEST 1 VIEW COMPARISON:  10/18/2020 and prior radiographs FINDINGS: An endotracheal tube is now noted. The carina is difficult to definitely identified but the endotracheal tube tip appears to lie approximately 2 cm above the carina. An NG tube is identified extending into the stomach with tip off the field of view. Moderate to large LEFT pleural effusion, LEFT LOWER lung consolidation/atelectasis, mild RIGHT basilar atelectasis, small RIGHT pleural effusion and pulmonary vascular congestion again noted. There is no evidence of pneumothorax. A RIGHT PICC line is present with tip overlying the UPPER RIGHT atrium. IMPRESSION: 1. Endotracheal tube placement with tip approximately 2 cm above the carina. NG tube placement as described. 2. Unchanged moderate to large LEFT pleural effusion, LEFT LOWER lung consolidation/atelectasis, mild RIGHT basilar atelectasis and small RIGHT pleural effusion. Electronically Signed   By: Harmon PierJeffrey  Hu M.D.   On: 10/20/2020 04:28   DG Chest Port 1 View  Result Date: 10/18/2020 CLINICAL DATA:  Acute respiratory distress EXAM: PORTABLE CHEST 1 VIEW COMPARISON:  10/16/2020 FINDINGS: NG tube is in the stomach. Moderate to large left effusion and small to moderate right effusion. Worsening airspace disease throughout the left lung. Continued right lower lobe atelectasis or infiltrate. Heart is normal  size. IMPRESSION: Bilateral pleural effusions, left larger than right. Bilateral airspace disease, worsening on the left since prior study, atelectasis versus infection. Electronically Signed   By: Charlett NoseKevin  Dover M.D.   On: 10/18/2020 22:25   DG Abd Portable 1V  Result Date: 10/20/2020 CLINICAL DATA:  NG tube placement. EXAM: PORTABLE ABDOMEN - 1 VIEW COMPARISON:  Prior CTs and radiographs. FINDINGS: An NG tube is identified with tip overlying the region of the mid stomach. A percutaneous catheter overlying the RIGHT abdomen is noted. No new abnormalities identified. IMPRESSION: NG tube with tip overlying the mid stomach. Electronically Signed   By: Harmon PierJeffrey  Hu M.D.   On: 10/20/2020 04:30   The patient is critically ill with multiple organ systems failure and requires high complexity decision making for assessment and support, frequent evaluation and titration of therapies, application of advanced monitoring technologies and extensive interpretation of multiple databases.  Independent Critical Care Time: 40 Minutes.   Mechele CollinJane Irie Fiorello, M.D. [facility] 10/20/2020 5:42 PM   Please see Amion for pager number to reach on-call Pulmonary and Critical Care Team.

## 2020-10-20 NOTE — Progress Notes (Signed)
PHARMACY - TOTAL PARENTERAL NUTRITION CONSULT NOTE  Indication:  Perforated duodenum  Patient Measurements: Weight: 56.1 kg (123 lb 10.9 oz)   Body mass index is 19.37 kg/m. Usual Weight: 120-125 lbs  Assessment:  [name] presented on 10/17/2020 with right MCA occlusion and underwent revascularization.  PMH significant for PUD post partial gastrectomy in 1990.  Patient was started on a dysphagia diet on 10/08/20 and also transferred out of the ICU.  Post-op course complicated by Afib RVR, aspiration PNA/cellulitis and AKI.  He was then started on TF on 10/12/20, which ended on 10/13/20 when CT showed perinephric abscess with gas formation.  Drain was placed and there is concern for fistulous communication to the colon on 10/16/20 CT.  Nutrition has been inadequate since admission and Pharmacy consulted for TPN management.  Per patient's son, patient has no recent weight loss and was eating a balanced diet PTA.  Patient transferred to ICU on 10/18/20 for increased WOB, lethargy, and tachycardia.  Intubated 10/20/20 early AM.  Glucose / Insulin: no hx DM - CBGs now elevated, due to TPN and stress Utilized 7units SSI yesterday. Electrolytes: Na down to 146 (now off Unasyn), CL 119, low CO2 (max acetate in TPN), others WNL Renal: SCr up 1.2, CK 991 > 107, BUN up to 35 LFTs / TGs: alk phos 142, AST/ALT 38/44, tbili 1.1.  TG 379 on 11/20 on low dose Propofol (ILE 15% of TPN kCal) Prealbumin / albumin: albumin 1.3, baseline prealbumin 9.4.  Intake / Output; MIVF: UOP 0.8 ml/kg/hr, drain 171m, net -1.7L GI Imaging: none since TPN Surgeries / Procedures: none since TPN  Central access: PICC 10/17/20 TPN start date: 10/17/20  Nutritional Goals (per RD rec on 11/17): 1800-2000 kCal, 100-115gm protein per day  Current Nutrition:  TPN Off Propofol  Plan:  Continue TPN at 80 ml/hr, providing 109g AA, 326g CHO and 29g ILE for a total of 1836 kCal, meeting 100% of patient needs Electrolytes in TPN:  no Na, K 658m/L, Ca 73m31mL, Mag 73mE22m, Phos 10mm34m, max acetate - no change today Add standard MVI and trace elements to TPN Continue moderate SSI Q4H + add 15 units regular insulin in TPN TPN labs on Mon, monitor CBGs  Mark May. Mark May,Mark May, BCPS, BCCCPSequoyah0/2021, 9:42 AM

## 2020-10-20 NOTE — Progress Notes (Addendum)
eLink Physician-Brief Progress Note Patient Name: Mark May DOB: 10/19/1956 MRN: [id number]   Date of Service  10/20/2020  HPI/Events of Note  Patient with agonal respirations, respiratory rate of 51 and dropping saturation, after explaining the situation to patient's son he gave permission for the intubation, patient is somewhat delirious with concern about his ability to make decisions for himself, he also does not speak english.  eICU Interventions  Anesthesia to intubate patient. Propofol + Fentanyl ordered for post-intubation sedation.        [name] 10/20/2020, 2:03 AM

## 2020-10-20 NOTE — Progress Notes (Signed)
Nutrition Follow-up  RD working remotely.  DOCUMENTATION CODES:   Severe malnutrition in context of chronic illness  INTERVENTION:  Continue TPN per pharmacy. RD updated estimated needs: Kcal:  1801 (PSU 2003b) Protein:  100-115 grams Fluid:  > 1.8 L  NUTRITION DIAGNOSIS:   Severe Malnutrition related to chronic illness (stomach ulcer s/p partial gastrectomy) as evidenced by moderate fat depletion, severe fat depletion, moderate muscle depletion, severe muscle depletion.  Ongoing.  GOAL:   Patient will meet greater than or equal to 90% of their needs  Met with TPN.  MONITOR:   Skin, Weight trends, Labs, I & O's (TPN tolerance)  REASON FOR ASSESSMENT:   Rounds    ASSESSMENT:   Mark May is a 64 y.o. Asian male with PMH of hypertension, BPH, sciatica, stomach ulcer status post surgery in the past presented to ER for acute onset left-sided weakness, left facial droop and left hemianopia.  11/5 s/p mechanical thrombectomy of right MCA and stent 11/5 intubated 11/7 extubated 11/8 s/p BSE - recommend continue NPO 11/9 s/p BSE - advanced to dysphagia 3 with nectar thick liquids 11/12 Cortrak tube placed; tip of tube confirmed in stomach 11/14 s/p right retroperitoneal drain placement in IR in setting of right retroperitoneal/perinephric abscess 11/15 initiated TF after GI clearance 11/16 TF stopped 11/17 initiated TPN 11/20 intubated  RD received page to on-call pager at approximately 11:38am with request from pharmacy to re-estimate calorie needs as patient was intubated this AM.  Patient is currently intubated on ventilator support MV: 12.7 L/min Temp (24hrs), Avg:99.5 F (37.5 C), Min:98.2 F (36.8 C), Max:100.6 F (38.1 C)  Medications reviewed and include: Novolog 0-15 units Q4hrs, Protonix, fentanyl gtt, Diflucan, Zosyn.  Labs reviewed: CBG 198-304, Sodium 146, Chloride 119, CO2 19, BUN 35, Triglycerides 379.  I/O: 1015 mL UOP yesterday (0.8 mL/kg/hr);  175 mL output yesterday from right retroperitoneal drain  Weight trend: 56.1 kg on 11/20; -0.4 kg from 11/12  Enteral Access: 16 Fr. NGT placed 11/16; terminates in mid stomach per abdominal x-ray 11/20; currently clamped per documentation in chart  IV Access: right brachial double lumen PICC placed 11/17  TPN regimen: Travasol 10% 57 grams/L, dextrose 17%, SMOFlipid 15 grams/L at 80 mL/hr; provides 1835.52 kcal, 109.4 grams amino acids; 15 units of insulin added into TPN today Of note patient is only receiving 28.8 grams SMOFlipid daily in TPN. Likely decreased in setting of hypertriglyceridemia. Noted patient has had hypertriglyceridemia throughout admission, even prior to initiation of TPN. Suspect it will improve with glycemic control. Consider increasing to at least 56.1 grams SMOFlipid daily (1 gram/kg) when able to prevent development of essential fatty acid deficiency.  Diet Order:   Diet Order            Diet NPO time specified Except for: Other (See Comments)  Diet effective now                EDUCATION NEEDS:   Education needs have been addressed  Skin:  Skin Assessment: Reviewed RN Assessment  Last BM:  10/20/2020 - medium type 6  Height:   Ht Readings from Last 1 Encounters:  10/04/2020 $RemoveB'5\' 7"'sNcgqNsM$  (1.702 m)   Weight:   Wt Readings from Last 1 Encounters:  10/20/20 56.1 kg   Ideal Body Weight:  67.3 kg  BMI:  Body mass index is 19.37 kg/m.  Estimated Nutritional Needs:   Kcal:  1801 (PSU 2003b)  Protein:  100-115 grams  Fluid:  > 1.8 L  Jacklynn Barnacle, MS, RD, LDN Pager number available on Amion

## 2020-10-20 NOTE — Anesthesia Procedure Notes (Signed)
Procedure Name: Intubation Date/Time: 10/20/2020 2:12 AM Performed by: Alease Medina, CRNA Pre-anesthesia Checklist: Patient identified, Emergency Drugs available, Suction available and Patient being monitored Patient Re-evaluated:Patient Re-evaluated prior to induction Oxygen Delivery Method: Circle system utilized Preoxygenation: Pre-oxygenation with 100% oxygen Induction Type: IV induction Ventilation: Mask ventilation without difficulty Laryngoscope Size: Glidescope and 4 Grade View: Grade I Tube type: Oral Tube size: 7.5 mm Number of attempts: 1 Airway Equipment and Method: Video-laryngoscopy and Rigid stylet Placement Confirmation: ETT inserted through vocal cords under direct vision,  positive ETCO2 and breath sounds checked- equal and bilateral Secured at: 22 cm Tube secured with: Tape Dental Injury: Teeth and Oropharynx as per pre-operative assessment

## 2020-10-20 NOTE — Progress Notes (Addendum)
Report received from [place], [place]. This nurse will resume care. Intubated 7.5 ETT 25 cm @ lip.FIO2 60%, PeeP 5, Rate 15, TV 520.  NG tube in place. External Catheter intact. PICC RUA intact w/ TPN infusing @ 80 ml/hr. Propofol @ 10 mcg/kg/min. Fentanyl @ 25 mcg/hr. Cardene @ 5 mg/hr. D5 @ 50 cc/hr. 20 G LFt Wrist c/d/i.  JP drain intact to Rt lower back/flank area. Continuing to monitor.

## 2020-10-20 NOTE — Progress Notes (Addendum)
eLink Physician-Brief Progress Note Patient Name: Mark May DOB: [birth year] MRN: [id number]   Date of Service  10/20/2020  HPI/Events of Note  Anemia - Hgb = 6.1. BBP = 104/47 with MAP = 65 on a Phenylephrine IV infusion. Hgb = 13.3 --> 6.1. No evidence of active bleeding. Not on heparin or Lovenox. I am concerned about a retroperitoneal bleed.   eICU Interventions  Plan: 1. Transfuse 2 units PRBC now. 2. Trend H/H. 3. Wean Phenylephrine IV infusion as tolerated.  4. CT Scan of Abdomen and Pelvis w/o contrast STAT.  5. Will ask ground team to evaluate him at bedside. 6. PT/INR and PTT STAT.     Intervention Category Major Interventions: Other:  [name] 10/20/2020, 11:06 PM

## 2020-10-20 NOTE — Procedures (Addendum)
Thoracentesis  Procedure Note  Andris Brothers  [id number]  Oct 27, 1956  Date:10/20/20  Time:5:40 PM   Provider Performing:Tasia Liz Mechele Collin   Procedure: Thoracentesis with imaging guidance (55208)  Indication(s) Pleural Effusion  Consent Risks of the procedure as well as the alternatives and risks of each were explained to the patient and/or caregiver.  Consent for the procedure was obtained and is signed in the bedside chart  Anesthesia Topical only with 1% lidocaine    Time Out Verified patient identification, verified procedure, site/side was marked, verified correct patient position, special equipment/implants available, medications/allergies/relevant history reviewed, required imaging and test results available.   Sterile Technique Maximal sterile technique including full sterile barrier drape, hand hygiene, sterile gown, sterile gloves, mask, hair covering, sterile ultrasound probe cover (if used).  Procedure Description Ultrasound was used to identify appropriate pleural anatomy for placement and overlying skin marked.       Area of drainage cleaned and draped in sterile fashion. Lidocaine was used to anesthetize the skin and subcutaneous tissue.  650 cc's of cloudy yeallow appearing fluid was drained from the left pleural space. Catheter then removed and bandaid applied to site.   Complications/Tolerance None; patient tolerated the procedure well. Chest X-ray is ordered to confirm no post-procedural complication.   EBL Minimal   Specimen(s) Pleural fluid

## 2020-10-20 NOTE — Progress Notes (Signed)
CRITICAL VALUE ALERT  Critical Value:  Hemoglobin 6.1  Date & Time Notied:  10/20/2020   2247  Provider Notified: Pola Corn   Orders Received/Actions taken:

## 2020-10-20 NOTE — Progress Notes (Signed)
eLink Physician-Brief Progress Note Patient Name: [name] DOB: 1956-11-06 MRN: [id number]   Date of Service  10/20/2020  HPI/Events of Note  Hypotension - BP = 63/50 with MAP = 57. H/H still pending. Last LVEF = 70-75%  eICU Interventions  Plan: 1. Bolus with 0.9 NaCl 1 liter IV over 1 hour now.  2. Check CVP after fluid bolus and Q 4 hours.  3. Await H/H. 4. Titrate Phenylephrine IV infusion for hemodynamic support.     Intervention Category Major Interventions: Hypotension - evaluation and management  [name] 10/20/2020, 9:29 PM

## 2020-10-20 NOTE — Progress Notes (Signed)
eLink Physician-Brief Progress Note Patient Name: Mark May DOB: 03/26/1956 MRN: [id number]   Date of Service  10/20/2020  HPI/Events of Note  Hypertension post-intubation.  eICU Interventions  PRN Labetalol ordered with first dose stat.        Thomasene Lot Clotilda Hafer 10/20/2020, 2:33 AM

## 2020-10-20 NOTE — Progress Notes (Signed)
eLink Physician-Brief Progress Note Patient Name: Mark May DOB: [birth year] MRN: [id number]   Date of Service  10/20/2020  HPI/Events of Note  Hypotension, BP 73/50, MAP 58 mmHg.  eICU Interventions  Phenylephrine infusion ordered  To keep MAP > 65 mmHg.        Thomasene Lot Biana Haggar 10/20/2020, 4:30 AM

## 2020-10-20 NOTE — Progress Notes (Addendum)
eLink Physician-Brief Progress Note Patient Name: Mark May DOB: 1956-09-18 MRN: [id number]   Date of Service  10/20/2020  HPI/Events of Note  Patient's blood pressure remains unacceptably high. 257/92, MAP 135.  eICU Interventions  Fentanyl infusion ordered to optimize sedation. Cardene infusion ordered to effectively control blood pressure if Labetalol does not reduce it.     Intervention Category Major Interventions: Hypertension - evaluation and management  Migdalia Dk 10/20/2020, 2:38 AM

## 2020-10-21 ENCOUNTER — Inpatient Hospital Stay (HOSPITAL_COMMUNITY): Payer: 59

## 2020-10-21 DIAGNOSIS — I6601 Occlusion and stenosis of right middle cerebral artery: Secondary | ICD-10-CM | POA: Diagnosis not present

## 2020-10-21 DIAGNOSIS — J942 Hemothorax: Secondary | ICD-10-CM

## 2020-10-21 LAB — COMPREHENSIVE METABOLIC PANEL
ALT: 80 U/L — ABNORMAL HIGH (ref 0–44)
AST: 144 U/L — ABNORMAL HIGH (ref 15–41)
Albumin: 1.5 g/dL — ABNORMAL LOW (ref 3.5–5.0)
Alkaline Phosphatase: 70 U/L (ref 38–126)
Anion gap: 9 (ref 5–15)
BUN: 66 mg/dL — ABNORMAL HIGH (ref 8–23)
CO2: 17 mmol/L — ABNORMAL LOW (ref 22–32)
Calcium: 7.3 mg/dL — ABNORMAL LOW (ref 8.9–10.3)
Chloride: 118 mmol/L — ABNORMAL HIGH (ref 98–111)
Creatinine, Ser: 2.62 mg/dL — ABNORMAL HIGH (ref 0.61–1.24)
GFR, Estimated: 26 mL/min — ABNORMAL LOW (ref >60)
Glucose, Bld: 209 mg/dL — ABNORMAL HIGH (ref 70–99)
Potassium: 5.6 mmol/L — ABNORMAL HIGH (ref 3.5–5.1)
Sodium: 144 mmol/L (ref 135–145)
Total Bilirubin: 1.6 mg/dL — ABNORMAL HIGH (ref 0.3–1.2)
Total Protein: 5 g/dL — ABNORMAL LOW (ref 6.5–8.1)

## 2020-10-21 LAB — TRIGLYCERIDES, BODY FLUIDS: Triglycerides, Fluid: 87 mg/dL

## 2020-10-21 LAB — HEMOGLOBIN AND HEMATOCRIT, BLOOD
HCT: 18.7 % — ABNORMAL LOW (ref 39.0–52.0)
HCT: 30.1 % — ABNORMAL LOW (ref 39.0–52.0)
Hemoglobin: 6 g/dL — CL (ref 13.0–17.0)
Hemoglobin: 9.9 g/dL — ABNORMAL LOW (ref 13.0–17.0)

## 2020-10-21 LAB — APTT: aPTT: 37 seconds — ABNORMAL HIGH (ref 24–36)

## 2020-10-21 LAB — GLUCOSE, CAPILLARY
Glucose-Capillary: 138 mg/dL — ABNORMAL HIGH (ref 70–99)
Glucose-Capillary: 173 mg/dL — ABNORMAL HIGH (ref 70–99)
Glucose-Capillary: 175 mg/dL — ABNORMAL HIGH (ref 70–99)
Glucose-Capillary: 194 mg/dL — ABNORMAL HIGH (ref 70–99)
Glucose-Capillary: 211 mg/dL — ABNORMAL HIGH (ref 70–99)
Glucose-Capillary: 228 mg/dL — ABNORMAL HIGH (ref 70–99)
Glucose-Capillary: 95 mg/dL (ref 70–99)

## 2020-10-21 LAB — CBC
HCT: 22.3 % — ABNORMAL LOW (ref 39.0–52.0)
HCT: 28.6 % — ABNORMAL LOW (ref 39.0–52.0)
Hemoglobin: 7.1 g/dL — ABNORMAL LOW (ref 13.0–17.0)
Hemoglobin: 9.4 g/dL — ABNORMAL LOW (ref 13.0–17.0)
MCH: 26.8 pg (ref 26.0–34.0)
MCH: 27.8 pg (ref 26.0–34.0)
MCHC: 31.8 g/dL (ref 30.0–36.0)
MCHC: 32.9 g/dL (ref 30.0–36.0)
MCV: 84.2 fL (ref 80.0–100.0)
MCV: 84.6 fL (ref 80.0–100.0)
Platelets: 163 10*3/uL (ref 150–400)
Platelets: 175 10*3/uL (ref 150–400)
RBC: 2.65 MIL/uL — ABNORMAL LOW (ref 4.22–5.81)
RBC: 3.38 MIL/uL — ABNORMAL LOW (ref 4.22–5.81)
RDW: 19 % — ABNORMAL HIGH (ref 11.5–15.5)
RDW: 18.8 % — ABNORMAL HIGH (ref 11.5–15.5)
WBC: 20.8 10*3/uL — ABNORMAL HIGH (ref 4.0–10.5)
WBC: 17.5 10*3/uL — ABNORMAL HIGH (ref 4.0–10.5)
nRBC: 0.6 % — ABNORMAL HIGH (ref 0.0–0.2)
nRBC: 0.7 % — ABNORMAL HIGH (ref 0.0–0.2)

## 2020-10-21 LAB — PREPARE RBC (CROSSMATCH)

## 2020-10-21 LAB — PROTIME-INR
INR: 1.7 — ABNORMAL HIGH (ref 0.8–1.2)
Prothrombin Time: 19.6 seconds — ABNORMAL HIGH (ref 11.4–15.2)

## 2020-10-21 LAB — LACTATE DEHYDROGENASE: LDH: 528 U/L — ABNORMAL HIGH (ref 98–192)

## 2020-10-21 LAB — PROTEIN, TOTAL: Total Protein: 3.7 g/dL — ABNORMAL LOW (ref 6.5–8.1)

## 2020-10-21 MED ORDER — SODIUM CHLORIDE 0.9% IV SOLUTION: Freq: Once | INTRAVENOUS | Status: AC

## 2020-10-21 MED ORDER — IOHEXOL 300 MG/ML  SOLN
100.0000 mL | Freq: Once | INTRAMUSCULAR | Status: AC | PRN
  Administered 2020-10-21: 100 mL via INTRAVENOUS

## 2020-10-21 MED ORDER — TRAVASOL 10 % IV SOLN
INTRAVENOUS | Status: DC
  Filled 2020-10-21: qty 1094.4

## 2020-10-21 MED ORDER — DEXTROSE 5 % IV SOLN: INTRAVENOUS | Status: DC

## 2020-10-21 NOTE — Progress Notes (Addendum)
NAME:  Mark May, MRN:  [id number], DOB:  1956-02-22, LOS: 16 ADMISSION DATE:  10/04/2020, CONSULTATION DATE: 10/01/2020 REFERRING MD: Dr. Curtis Sites, CHIEF COMPLAINT: Left-sided weakness  Brief History   64 year old male with hypertension and hyperlipidemia who presented with left-sided weakness, noted to have right MCA occlusion status post thrombectomy and stent placement 11/5.  Hospitalization complicated by hypoxic respiratory failure secondary to pulmonary edema requiring BiPAP and lasix.  Awaiting CIR placement however on the evening of 11/10, he was noted to become more lethargic with tachycardia and tachypnea.  Had complained of abdominal pain with some distention and chest pain which resolved after burping.  KUB obtained which was normal.  On 11/11, some what more responsive but now with increasing sCr in which lisinopril was stopped and development of fever 102.6  TR[facility] initially consulted for help with medical management however on their evaluation, PCCM consulted given ill appearance and developing hypotension and new onset Afib with RVR found to have sepsis with RLL pneumonia likely due to aspiration started on zosyn. PCCM consulted for continue right abdominal pain and distension with concern for bowel perforation secondary to Cortrak.  On 11/13, CT abd/ pelvis showed large right perinephric fluid collection and pockets of air concerning for an infectious process/abscess. Underwent CT guided drain placement of right retroperitoneal abscess.  On 11/15, he developed melena with anemia.  GI consulted with plans to medically management and deferred EGD.  Surgery was consulted on 11/17 with imaging concerning for possible contained bowel perforation.  On 11/20, he developed respiratory distress requiring intubation.   Past Medical History  Hypertension Peptic ulcer disease, previous partial gastrectomy (in [place]/ 1990) Gout BPH Sciatica   Significant Hospital Events   11/5 Mechanical  thrombectomy of right MCA and stent 11/5 11/11 PCCM reconsulted  11/12 Cortrak placed 11/17 PCCM reconsulted 11/18 - ccm recalled by rapid response, fever, resp distress, CXR new left infiltrates with left pleural effusion  Consults:  IR Neurology PCCM 11/5- 11/8; 11/11; 11/16-11/17, 11/18 -  [facility] Urology 11/13 GI 11/15 CCS 11/17  Procedures:  11/6 right femoral sheath was removed 11/14 CT guided drainage of right retroperitoneal abscess; JP >> 11/16 L NGT >> 11/17 RUE PICC >> 11/20 ETT >> 11/20 left thoracentesis > 650 ml of cloudy yellow fluid  Significant Diagnostic Tests:  11/5 CT head: No acute abnormality. Minimal diffuse cerebral and cerebellar atrophy. Mild chronic small vessel white matter ischemic changes in both cerebral hemispheres  11/5 CTA head and neck: Severe distal right M1 stenosis or subocclusive thrombus, Right MCA infarct with extensive penumbra, 4 mm right supraclinoid ICA aneurysm. Widely patent cervical carotid and vertebral arteries.  11/5 postprocedure CT head: Hypodensity in the right lateral temporal lobe now shows mild hemorrhage or contrast enhancement. This is most consistent with acute infarct. There has been interval stenting of the right middle cerebral artery.  11/6 MRI brain: 1. Fairly extensive patchy acute/early subacute infarcts within the right MCA/watershed territory.  2. Additional small acute/early subacute infarcts within the dorsal right thalamus and left basal ganglia. 3. Background moderate chronic small vessel ischemic disease.  11/6  MRA head: 1. Interval stenting of the M1/M2 right middle cerebral artery. Flow related signal is present proximal and distal to the stent suggestive of stent patency. 2. 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  11/5 DG Abd 1 View Gastric catheter within the stomach kinked at the proximal side port. This could be withdrawn slightly S necessary.  11/6  Transthoracic Echocardiogram  1. Left ventricular ejection fraction, by estimation, is 70 to 75%. The left ventricle has hyperdynamic function. The left ventricle has no regional wall motion abnormalities. Left ventricular diastolic parameters are consistent with Grade II diastolic dysfunction (pseudonormalization). Elevated left atrial pressure.  2. Right ventricular systolic function is normal. The right ventricular size is normal.  3. The mitral valve is normal in structure. Mild mitral valve regurgitation. No evidence of mitral stenosis.  4. The aortic valve is normal in structure. Aortic valve regurgitation is not visualized. No aortic stenosis is present.  5. The inferior vena cava is dilated in size with <50% respiratory variability, suggesting right atrial pressure of 15 mmHg.  11/10 KUB >> normal gas pattern  11/13 CT abdomen and pelvis wo contrast 1. Large right perinephric fluid collection and pockets of air concerning for an infectious process/abscess. Clinical correlation is recommended. 2. Cholelithiasis. 3. Nonobstructing right renal calculi. No hydronephrosis. 4. Colonic diverticulosis. No bowel obstruction. 5. Partially visualized small bilateral pleural effusions with complete consolidative changes of the visualized lower lobes. 6. Aortic Atherosclerosis (ICD10-I70.0).  11/16 CT A/P IMPRESSION: Interval placement of drainage catheter into the right perinephric space in the area of previously seen gas and fluid collection. Fluid collection has decreased since prior study. There is now contrast material seen within the perinephric space and extending into the right paracolic gutter. This is concerning for possible fistulous communication to the colon. May consider contrast injection through the drainage catheter under fluoroscopic guidance to assess for enteric fistula. Moderate bilateral pleural effusions with bibasilar atelectasis or consolidation, unchanged.   Cholelithiasis.  NG tube in the stomach.  Aortic atherosclerosis.  Small to moderate free fluid in the pelvis.  11/21 CT abd/ pelvis >> 1. Very large area of intra-abdominal fluid and blood within the left upper quadrant, with an appearance worrisome for active bleeding. 2. Large left pleural effusion with a small to moderate sized right pleural effusion. 3. Moderate severity bibasilar consolidation which may represent atelectasis and/or pneumonia. 4. Cholelithiasis. 5. Multiple stable bilateral renal cysts of various sizes. 6. Stable position of the right-sided percutaneous drainage catheter with a moderate amount of surrounding contained free air, fluid and inflammatory fat stranding. Fistulous communication with the adjacent portion of large bowel cannot be excluded. 7. Moderate to marked amount of para muscular subcutaneous inflammatory fat stranding along the lateral aspects of the left abdominal and pelvic walls. 8. Moderate to marked amount of para muscular. 9. Aortic atherosclerosis. Aortic Atherosclerosis   Micro Data:  11/5 MRSA PCR negative 11/10 UC >> neg 11/11 BCx2 >> neg 11/13 BC x2 >> neg 11/14 right RTP abscess >> klebsiella  11/19 BCx 2 >> 11/20 Left pleural fluid >>  Antimicrobials:  11/5 cefazolin - 11/6 11/11 Zosyn >11/17; 11/19 >> 11/11 Flagyl - 11/11 11/11 cefepime - 11/11; 11/18 >> 11/19 11/12 Vancomycin > 11/16; 11/18 11/17  Unasyn /11/18 11/17 diflucan >> 11/18 flagyl >>11/19  Interim history/subjective:  Overnight, hypotensive with Hgb drop to 6.1 s/p 2 units PRBC  tmax 103, WBC 10.6-> 20.8  CT abd/ pelvis overnight with new large area of intra-abdominal fluid and blood in LUQ/ retroperitoneal concerning for active bleeding Remains on phenylephrine 50 mcg/min  Objective   Blood pressure (!) 86/57, pulse 91, temperature 98.8 F (37.1 C), temperature source Axillary, resp. rate 18, weight 56.1 kg, SpO2 96 %. CVP:  [9 mmHg] 9 mmHg  Vent Mode:  PRVC FiO2 (%):  [30 %-40 %] 30 % Set Rate:  [  15 bmp] 15 bmp Vt Set:  [520 mL] 520 mL PEEP:  [5 cmH20] 5 cmH20 Plateau Pressure:  [15 cmH20-26 cmH20] 26 cmH20   Intake/Output Summary (Last 24 hours) at 10/21/2020 0920 Last data filed at 10/21/2020 0800 Gross per 24 hour  Intake 3135.1 ml  Output 1200 ml  Net 1935.1 ml   Filed Weights   10/19/20 0500 10/20/20 0500 10/21/20 0424  Weight: 60.6 kg 56.1 kg 56.1 kg   Physical Exam General: Thin, ill appearing older male in NAD  HEENT: MM pink/moist, ETT, left NGT- dark bilious output, pupils 2/sluggish Neuro: Sedated but will follow commands, moves right side spont, LUE/ LLE flaccid CV: NSR, no murmur PULM:  MV supported breaths, clear on right, diminished left  GI: hypoBS, NT, condom cath, JP drain- tan/ purulent Extremities: warm/dry, no LE edema  Skin: no rashes   CXR 11/21 reviewed, stable ETT, persistent mod to large left effusion, right basilar opacity vs atelectasis  UOP stable  JP drain 125 ml/ 24hrs Minimal dark bilious output from NGT  Assessment & Plan:   Acute respiratory failure secondary to aspiration pneumonia with bilateral pleural effusion, left empyema  - S/p thoracentesis 11/20 w/ 600ml removed- exudative by lights criteria, neutrophil prominent 15K, glucose <20; gram stain, culture, and cytology pending.  CT a/p overnight concerning for left loculated/ layering effusion, CXR this am showing persistent moderate to large left effusion.   - stat CT chest/ abd/ pelvis with contrast ordered to evaluate further, ? translocation from abd source vs r/o hemothorax; will decide on further plan of care- either with pigtail vs large bore CT  - continue full MV support, PRVC  - VAP bundle/ PPI  - PAD protocol with fentanyl gtt for RASS goal 0/-1 with daily WUA - abx as below   Shock- multifactorial, ABLA and septic  P:  Hgb post 2 units 7.1.  Remains on vasopressors.  Will transfuse additional 1 units PRBC now and 1 unit  FFP ( INR 1.7) w/ post transfusion labs Transfuse for Hgb < 7 Continue zosyn and diflucan Follow culture data - blood/ pleural studies  Continue neosynephrine for MAP goal > 65; if increasing pressor amount, will switch to NE in the setting of septic shock  Sepsis due to right lower lobe pneumonia likely aspiration and intraabdominal abscess (klebsiella and candida) in setting of bowel perf and now with left empyema  - CT chest/ abd/ pelvis with contrast ordered - Continue coverage for pneumonia and abdominal abscess in setting of bowel perf - Needs Dentition consult at some point  Intra-abdominal abscess, contained bowel perforation- superior-most edge lines up with distal end of cortrak Plan  - Continue bowel rest and TPN - NGT to LIWS - Surgery following, appreciate input - stat CT chest/ abd/ pelvis with contrast ordered to further evaluate CT findings overnight of new LUQ mixed fluid and blood collection- appears retroperitoneal   Paroxysmal atrial fibrillation with rapid ventricular response - improved Plan - remains in NSR - holding heparin given ABLA  Hypernatremia - pending BMET  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 Continue TPN Pain/Anxiety/Delirium protocol (if indicated):  Fentanyl gtt  VAP protocol (if indicated): Needs  DENTAL consult. Oral care protocol DVT prophylaxis: SCDs for now GI prophylaxis: PPI BID Glucose control: SSI Mobility: bed rest Code Status: full Family Communication: Patient's son and daughter extensively updated at bedside. Nurses were able to help them set up mychart for them to have better access to his chart and labs  (they are relying on friends/ family in the medical field to help guide them in their decision making) Disposition:  ICU  LABS    PULMONARY Recent Labs  Lab 10/16/20 0012 10/18/20 2208 10/20/20 0359  PHART 7.495* 7.532* 7.302*  PCO2ART 26.7* 22.4* 42.6  PO2ART 76.5* 56.7* 76*  HCO3 20.4 19.0* 21.1  TCO2  --   --  22  O2SAT 95.1 92.3 94.0    CBC Recent Labs  Lab 10/17/20 0920 10/17/20 0920 10/18/20 0727 10/20/20 0359 10/20/20 2230 10/21/20 0306 10/21/20 0839  HGB 10.1*   < > 10.5*   < > 6.1* 6.0* 7.1*  HCT 29.9*   < > 31.3*   < > 19.1* 18.7* 22.3*  WBC 9.4  --  10.6*  --   --   --  20.8*  PLT 243  --  253  --   --   --  163   < > = values in this interval not displayed.    COAGULATION Recent Labs  Lab 10/21/20 0139  INR 1.7*    CARDIAC  No results for input(s): TROPONINI in the last 168 hours. No results for input(s): PROBNP in the last 168 hours.   CHEMISTRY Recent Labs  Lab 10/16/20 0508 10/16/20 0508 10/16/20 1652 10/17/20 0920 10/17/20 0920 10/18/20 0726 10/18/20 0726 10/19/20 0032 10/19/20 0032 10/20/20 0359 10/20/20 0632  NA 152*   < >  --  147*  --  148*  --  148*  --  149* 146*  K 4.3   < >  --  3.1*   < > 3.6   < > 3.6   < > 3.9 4.0  CL 124*  --   --  119*  --  121*  --  121*  --   --  119*  CO2 20*  --   --  19*  --  19*  --  20*  --   --  19*  GLUCOSE 144*  --   --  158*  --  210*  --  130*  --   --  341*  BUN 46*  --   --  33*  --  27*  --  26*  --   --  35*  CREATININE 1.32*  --   --  1.10  --  1.07  --  1.03  --   --  1.20  CALCIUM 7.4*  --   --  7.4*  --  7.3*  --  7.4*  --   --  7.5*  MG 2.4   < > 2.4 2.2  --  2.2  --  2.2  --   --  2.1  PHOS 2.9   < > 3.9 4.0  --  2.7  --  3.0  --   --  3.7   < > = values in this interval not displayed.   Estimated Creatinine Clearance: 48.7 mL/min (by C-G formula based on SCr of 1.2 mg/dL).   LIVER Recent Labs  Lab 10/15/20 0408 10/18/20 0726 10/21/20 0139 10/21/20 0627  AST 134* 38   --   --   ALT 70* 44  --   --  ALKPHOS 200* 142*  --   --   BILITOT 1.3* 1.1  --   --   PROT 5.1* 5.1*  --  3.7*  ALBUMIN 1.3* 1.3*  --   --   INR  --   --  1.7*  --      INFECTIOUS Recent Labs  Lab 10/18/20 2258 10/19/20 0032 10/20/20 0632  LATICACIDVEN 1.6 1.5  --   PROCALCITON  --  1.38 1.96     ENDOCRINE CBG (last 3)  Recent Labs    10/21/20 0009 10/21/20 0412 10/21/20 0813  GLUCAP 211* 228* 175*     IMAGING x48h  - image(s) personally visualized  -   highlighted in bold CT ABDOMEN PELVIS WO CONTRAST  Result Date: 10/21/2020 CLINICAL DATA:  Abdominal pain and abdominal distension. EXAM: CT ABDOMEN AND PELVIS WITHOUT CONTRAST TECHNIQUE: Multidetector CT imaging of the abdomen and pelvis was performed following the standard protocol without IV contrast. COMPARISON:  October 16, 2020 FINDINGS: Lower chest: Moderate severity areas of consolidation are seen within the bilateral lung bases. There is a large left pleural effusion with suspected loculated components. This is increased in size when compared to the prior study. A small to moderate sized right pleural effusion is also noted. Hepatobiliary: No focal liver abnormality is seen. Numerous subcentimeter gallstones are seen within the lumen of a normal appearing gallbladder. There is no evidence of biliary dilatation. Pancreas: Unremarkable. No pancreatic ductal dilatation or surrounding inflammatory changes. Spleen: The spleen is displaced within the anteromedial aspect of the left upper quadrant the. A 15.5 cm x 11.3 cm x 16.9 cm area containing a mixture of hemorrhagic and non hemorrhagic fluid is seen within the left upper quadrant. This appears to be retroperitoneal in location and represents a new finding when compared to the prior exam. Adrenals/Urinary Tract: Adrenal glands are unremarkable. Kidneys are normal in size. Multiple stable bilateral renal cysts of various sizes are seen. Multiple 2 mm, 3 mm and 4 mm  nonobstructing renal stones are seen throughout the right kidney. A mild amount of air is again seen within the lumen of the urinary bladder. Stomach/Bowel: A nasogastric tube is seen with its distal tip noted within the body of the stomach. The appendix is not clearly identified. No evidence of bowel dilatation. A 2.1 cm x 1.3 cm area of oral contrast is seen adjacent to the posteromedial aspect of the cecum (axial CT image 69, CT series number 3). This is present on the prior study. Vascular/Lymphatic: There is moderate severity calcification of the abdominal aorta and bilateral common iliac arteries, without evidence of aneurysmal dilatation. No enlarged abdominal or pelvic lymph nodes. Reproductive: Prostate is unremarkable. Other: A percutaneous drainage catheter is again seen entering via the right flank. Its distal tip is noted along the posterior aspect of the right lower quadrant and is unchanged in position when compared to the prior study. Numerous foci of moderate severity contained free air are again seen within this region. Persistent surrounding mesenteric inflammatory fat stranding and mesenteric fluid is also noted. A small amount of posterior pelvic free fluid is seen. This is stable in appearance when compared to the prior study. Musculoskeletal: A moderate to marked amount of para muscular subcutaneous inflammatory fat stranding is seen along the lateral aspects of the left abdominal and pelvic walls. No acute or significant osseous findings. IMPRESSION: 1. Very large area of intra-abdominal fluid and blood within the left upper quadrant, with an appearance worrisome for active bleeding. 2.  Large left pleural effusion with a small to moderate sized right pleural effusion. 3. Moderate severity bibasilar consolidation which may represent atelectasis and/or pneumonia. 4. Cholelithiasis. 5. Multiple stable bilateral renal cysts of various sizes. 6. Stable position of the right-sided percutaneous  drainage catheter with a moderate amount of surrounding contained free air, fluid and inflammatory fat stranding. Fistulous communication with the adjacent portion of large bowel cannot be excluded. 7. Moderate to marked amount of para muscular subcutaneous inflammatory fat stranding along the lateral aspects of the left abdominal and pelvic walls. 8. Moderate to marked amount of para muscular. 9. Aortic atherosclerosis. Aortic Atherosclerosis (ICD10-I70.0). Electronically Signed   By: Aram Candela M.D.   On: 10/21/2020 01:53   DG Chest Port 1 View  Result Date: 10/20/2020 CLINICAL DATA:  Post thoracentesis EXAM: PORTABLE CHEST 1 VIEW COMPARISON:  Radiograph 10/20/2020 FINDINGS: Endotracheal tube tip terminates 2.5 cm from the carina. Transesophageal tube tip and side port distal to the GE junction. Right upper extremity PICC terminates at the right atrium. Telemetry leads overlie the chest. Slight interval decrease in the size of a left pleural effusion with some lobular margins likely reflecting loculation. More coalescent retrocardiac opacity could reflect a combination of layering pleural fluid, atelectasis and/or airspace disease. Increasing coalescence of opacity seen in the right mid and lower lung with a trace right effusion as well. No visible pneumothorax. The aorta is calcified. The remaining cardiomediastinal contours are unremarkable. No acute osseous or soft tissue abnormality. Degenerative changes are present in the imaged spine and shoulders. IMPRESSION: 1. Slight interval decrease in size of a left pleural effusion with some lobular margins which could reflect loculation. 2. Slightly increased coalescent retrocardiac and right mid and lower lung opacities, could reflect a combination of layering pleural fluid, atelectasis and/or airspace disease. 3. No pneumothorax. 4. Lines and tubes as above. 5.  Aortic Atherosclerosis (ICD10-I70.0). Electronically Signed   By: Kreg Shropshire M.D.   On:  10/20/2020 19:50   DG CHEST PORT 1 VIEW  Result Date: 10/20/2020 CLINICAL DATA:  Respiratory distress.  Intubation. EXAM: PORTABLE CHEST 1 VIEW COMPARISON:  10/18/2020 and prior radiographs FINDINGS: An endotracheal tube is now noted. The carina is difficult to definitely identified but the endotracheal tube tip appears to lie approximately 2 cm above the carina. An NG tube is identified extending into the stomach with tip off the field of view. Moderate to large LEFT pleural effusion, LEFT LOWER lung consolidation/atelectasis, mild RIGHT basilar atelectasis, small RIGHT pleural effusion and pulmonary vascular congestion again noted. There is no evidence of pneumothorax. A RIGHT PICC line is present with tip overlying the UPPER RIGHT atrium. IMPRESSION: 1. Endotracheal tube placement with tip approximately 2 cm above the carina. NG tube placement as described. 2. Unchanged moderate to large LEFT pleural effusion, LEFT LOWER lung consolidation/atelectasis, mild RIGHT basilar atelectasis and small RIGHT pleural effusion. Electronically Signed   By: Harmon Pier M.D.   On: 10/20/2020 04:28   DG Abd Portable 1V  Result Date: 10/20/2020 CLINICAL DATA:  NG tube placement. EXAM: PORTABLE ABDOMEN - 1 VIEW COMPARISON:  Prior CTs and radiographs. FINDINGS: An NG tube is identified with tip overlying the region of the mid stomach. A percutaneous catheter overlying the RIGHT abdomen is noted. No new abnormalities identified. IMPRESSION: NG tube with tip overlying the mid stomach. Electronically Signed   By: Harmon Pier M.D.   On: 10/20/2020 04:30     CCT: 70 mins  Posey Boyer, ACNP [facility] 10/21/2020, 9:20 AM  See Amion for personal pager PCCM on call pager [phone]

## 2020-10-21 NOTE — Progress Notes (Signed)
PHARMACY - TOTAL PARENTERAL NUTRITION CONSULT NOTE  Indication:  Perforated duodenum  Patient Measurements: Weight: 56.1 kg (123 lb 10.9 oz)   Body mass index is 19.37 kg/m. Usual Weight: 120-125 lbs  Assessment:  10 YOM presented on 10/25/2020 with right MCA occlusion and underwent revascularization.  PMH significant for PUD post partial gastrectomy in 1990.  Patient was started on a dysphagia diet on 10/08/20 and also transferred out of the ICU.  Post-op course complicated by Afib RVR, aspiration PNA/cellulitis and AKI.  He was then started on TF on 10/12/20, which ended on 10/13/20 when CT showed perinephric abscess with gas formation.  Drain was placed and there is concern for fistulous communication to the colon on 10/16/20 CT.  Nutrition has been inadequate since admission and Pharmacy consulted for TPN management.  Per patient's son, patient has no recent weight loss and was eating a balanced diet PTA.  Patient transferred to ICU on 10/18/20 for increased WOB, lethargy, and tachycardia.    Glucose / Insulin: no hx DM - CBGs improving but remain elevated, due to TPN and stress Utilized 34 units SSI + 15 units insulin in TPN Electrolytes: 11/20 labs - Na down to 146 (now off Unasyn), CL 119, low CO2 (max acetate in TPN), others WNL Renal: 11/20 labs - SCr up 1.2, CK 991 > 107, BUN up to 35 LFTs / TGs: alk phos 142, AST/ALT 38/44, tbili 1.1.  TG 379 while on low dose Propofol > off 11/20 (ILE 15% of TPN kCal) Prealbumin / albumin: albumin 1.3, baseline prealbumin 9.4.  Intake / Output; MIVF: UOP 0.8 ml/kg/hr (Lasix 13m IV x1), drain 1756m net -1.7L GI Imaging:  11/20 CT - large IA fluid and blood in LUQ worrisome for active bleeding, renal cysts, cannot exclude fistulous communication with large bowel Surgeries / Procedures:  11/20 - intubated, thoracentesis -- drained 65065mloudy, yellow appearing fluid  Central access: PICC 10/17/20 TPN start date: 10/17/20  Nutritional Goals (per  RD rec on 11/20): 1800-2000 kCal, 100-115gm protein per day  Current Nutrition:  TPN  Plan:  Continue TPN at 80 ml/hr, providing 109g AA, 326g CHO and 29g ILE for a total of 1836 kCal, meeting 100% of patient needs Electrolytes in TPN: no Na, K 55m6m, Ca 5mEq44m Mag 5mEq/41mPhos 10mmol72mmax acetate - no change today Add standard MVI and trace elements to TPN Continue moderate SSI Q4H + continue 15 units regular insulin in TPN (hold off on increasing since the latest CBGs were in the 170s) F/U AM labs, monitor CBGs and I/O's  Mark May, PMina MarbleD, BCPS, BCCCP [id number], 1:41 PM

## 2020-10-21 NOTE — Progress Notes (Signed)
Spoke with pt's son at bedside, updated on results of CT abd.  He agrees to blood transfusion.   Tx PRBC now  F/u cbc  Continue gentle volume, pressors  Check coags  Defer possible surgical consultation to morning team once blood products in unless changes clinically    Dirk Dress, NP Pulmonary/Critical Care Medicine  10/21/2020  2:53 AM

## 2020-10-21 NOTE — Progress Notes (Signed)
Transported to and from 3M09 to CT1 on ventilator. Pt stable throughout with no complications.

## 2020-10-21 NOTE — Progress Notes (Signed)
Pt noted to have worsening hypotension.  Hb 6.1.  CT abd/pelvis ordered to assess for retroperitoneal bleeding.  Spoke with pt's son.  Explained to him the reasoning for PRBC transfusion.  He would like to wait CT abd/pelvis results before agreeing to PRBC transfusion.   Coralyn Helling, MD [facility] Pulmonary/Critical Care Pager - [phone] 10/21/2020, 12:52 AM

## 2020-10-21 NOTE — Progress Notes (Signed)
eLink Physician-Brief Progress Note Patient Name: Mark May DOB: 31-Dec-1955 MRN: [id number]   Date of Service  10/21/2020  HPI/Events of Note  Called by radiology with Finding on CT Scan of Abdomen/Pelvis of large intra-abdominal  LUQ collection of fluid and blood whose appearance is worrisome for active bleeding.   eICU Interventions  Plan: 1. Ground  Team informed of finding.  2. PT/INR and PTT pending.      Intervention Category Major Interventions: Other:  [place] Dennard Nip 10/21/2020, 1:50 AM

## 2020-10-21 NOTE — Progress Notes (Signed)
Took pt from room 78M #9 to CT with RN  and back to the room without any complications.

## 2020-10-21 NOTE — Consult Note (Signed)
[address]       [place] [address]             [phone]                    Juwaun Geers Coosa Medical Record #[id number] Date of Birth: [birth year]  Referring: No ref. provider found Primary Care: Glean Hess, MD Primary Cardiologist: No primary care provider on file.  Chief Complaint:   No chief complaint on file.   History of Present Illness:    Mark May 64 y.o. male originally admitted on 10/26/2020 with left-sided weakness and facial droop.  During his hospitalization he is also developed a bowel perforation with an intra-abdominal abscess.  He has been quite debilitated, and developed a right-sided pneumonia likely due to aspiration.  On 10/20/2020 he underwent a left-sided thoracentesis and subsequently developed worsening respiratory failure.  Imaging was consistent with an enlarging left-sided pleural effusion concerning for hemothorax.  There was some evidence of extravasation from one of the intercostal arteries.  CTS was consulted to assist with management.      Zubrod Score: At the time of surgery this patient's most appropriate activity status/level should be described as: _0     0    Normal activity, no symptoms _1     1    Restricted in physical strenuous activity but ambulatory, able to do out light work _2     2    Ambulatory and capable of self care, unable to do work activities, up and about               >50 % of waking hours                              _3     3    Only limited self care, in bed greater than 50% of waking hours _4     4    Completely disabled, no self care, confined to bed or chair _5     5    Moribund   Past Medical History:  Diagnosis Date  . Gout   . Hypertension     Past Surgical History:  Procedure Laterality Date  . IR CT HEAD LTD  10/18/2020  . IR INTRA CRAN STENT  10/17/2020  . IR PERCUTANEOUS ART THROMBECTOMY/INFUSION INTRACRANIAL INC DIAG ANGIO  10/18/2020  . PARTIAL GASTRECTOMY  1990   done in  [place] for PUD  . PROSTATE BIOPSY  ~ 2005   negative for cancer (done in [place])  . RADIOLOGY WITH ANESTHESIA N/A 10/25/2020   Procedure: IR WITH ANESTHESIA;  Surgeon: Radiologist, Medication, MD;  Location: [facility];  Service: Radiology;  Laterality: N/A;    Family History  Problem Relation Age of Onset  . Diabetes Mother   . Diabetes Father      Social History   Tobacco Use  Smoking Status Never Smoker  Smokeless Tobacco Never Used    Social History   Substance and Sexual Activity  Alcohol Use No  . Alcohol/week: 0.0 standard drinks     No Known Allergies  Current Facility-Administered Medications  Medication Dose Route Frequency Provider Last Rate Last Admin  .  stroke: mapping our early stages of recovery book   Does not apply Once Burnetta Sabin L, NP      . 0.9 %  sodium chloride infusion   Intravenous PRN Rosalin Hawking, MD  Paused at 10/18/20 2154  . acetaminophen (TYLENOL) suppository 325 mg  325 mg Rectal Q4H PRN Opyd, Ilene Qua, MD   325 mg at 10/21/20 0043  . aspirin suppository 300 mg  300 mg Rectal Daily Rosalin Hawking, MD   300 mg at 10/20/20 1027  . chlorhexidine gluconate (MEDLINE KIT) (PERIDEX) 0.12 % solution 15 mL  15 mL Mouth Rinse BID Margaretha Seeds, MD   15 mL at 10/21/20 0837  . Chlorhexidine Gluconate Cloth 2 % PADS 6 each  6 each Topical Daily Donzetta Starch, NP   6 each at 10/21/20 1836  . dextrose 5 % solution   Intravenous Continuous Margaretha Seeds, MD      . fentaNYL (SUBLIMAZE) injection 25-100 mcg  25-100 mcg Intravenous Q2H PRN Frederik Pear, MD   50 mcg at 10/20/20 1828  . fentaNYL 2566mg in NS 2529m(1078mml) infusion-PREMIX  0-400 mcg/hr Intravenous Continuous OgaFrederik PearD 25 mL/hr at 10/21/20 1836 250 mcg/hr at 10/21/20 1836  . fluconazole (DIFLUCAN) IVPB 400 mg  400 mg Intravenous Q24H MarRolla FlattenPH 100 mL/hr at 10/21/20 1554 400 mg at 10/21/20 1554  . insulin aspart (novoLOG) injection 0-15 Units  0-15 Units Subcutaneous  Q4H BarHenri MedalPH   3 Units at 10/21/20 1709  . MEDLINE mouth rinse  15 mL Mouth Rinse 10 times per day EllMargaretha SeedsD   15 mL at 10/21/20 1711  . pantoprazole (PROTONIX) injection 40 mg  40 mg Intravenous Q12H BarSalley SlaughterA-C   40 mg at 10/21/20 0933  . phenylephrine (NEOSYNEPHRINE) 10-0.9 MG/250ML-% infusion  0-400 mcg/min Intravenous Titrated OgaFrederik PearD 75 mL/hr at 10/21/20 1521 50 mcg/min at 10/21/20 1521  . piperacillin-tazobactam (ZOSYN) IVPB 3.375 g  3.375 g Intravenous Q8H EllMargaretha SeedsD 12.5 mL/hr at 10/21/20 1551 3.375 g at 10/21/20 1551  . sodium chloride flush (NS) 0.9 % injection 10-40 mL  10-40 mL Intracatheter Q12H Dahal, Binaya, MD   10 mL at 10/20/20 1037  . sodium chloride flush (NS) 0.9 % injection 10-40 mL  10-40 mL Intracatheter PRN DahTerrilee CroakD   10 mL at 10/18/20 2118  . sodium chloride flush (NS) 0.9 % injection 5 mL  5 mL Intracatheter Q8H YamAletta EdouardD   5 mL at 10/21/20 0511    Review of Systems  Unable to perform ROS: Intubated     PHYSICAL EXAMINATION: BP 105/66   Pulse 91   Temp (!) 100.6 F (38.1 C) (Axillary)   Resp 16   Wt 56.1 kg   SpO2 95%   BMI 19.37 kg/m  Physical Exam Constitutional:      Comments: Intubated and sedated. Deconditioned.  Cardiovascular:     Rate and Rhythm: Normal rate.   Vent Mode: PRVC FiO2 (%):  [30 %-40 %] 30 % Set Rate:  [15 bmp] 15 bmp Vt Set:  [520 mL] 520 mL PEEP:  [5 cmH20] 5 cmH20 Plateau Pressure:  [17 cmH20-27 cmH20] 25 cmH20 Phenylephrine at 50 mcgs Diagnostic Studies & Laboratory data:     Recent Radiology Findings:   CT ABDOMEN PELVIS WO CONTRAST  Result Date: 10/21/2020 CLINICAL DATA:  Abdominal pain and abdominal distension. EXAM: CT ABDOMEN AND PELVIS WITHOUT CONTRAST TECHNIQUE: Multidetector CT imaging of the abdomen and pelvis was performed following the standard protocol without IV contrast. COMPARISON:  October 16, 2020 FINDINGS: Lower chest:  Moderate severity areas of consolidation are seen within the bilateral lung bases.  There is a large left pleural effusion with suspected loculated components. This is increased in size when compared to the prior study. A small to moderate sized right pleural effusion is also noted. Hepatobiliary: No focal liver abnormality is seen. Numerous subcentimeter gallstones are seen within the lumen of a normal appearing gallbladder. There is no evidence of biliary dilatation. Pancreas: Unremarkable. No pancreatic ductal dilatation or surrounding inflammatory changes. Spleen: The spleen is displaced within the anteromedial aspect of the left upper quadrant the. A 15.5 cm x 11.3 cm x 16.9 cm area containing a mixture of hemorrhagic and non hemorrhagic fluid is seen within the left upper quadrant. This appears to be retroperitoneal in location and represents a new finding when compared to the prior exam. Adrenals/Urinary Tract: Adrenal glands are unremarkable. Kidneys are normal in size. Multiple stable bilateral renal cysts of various sizes are seen. Multiple 2 mm, 3 mm and 4 mm nonobstructing renal stones are seen throughout the right kidney. A mild amount of air is again seen within the lumen of the urinary bladder. Stomach/Bowel: A nasogastric tube is seen with its distal tip noted within the body of the stomach. The appendix is not clearly identified. No evidence of bowel dilatation. A 2.1 cm x 1.3 cm area of oral contrast is seen adjacent to the posteromedial aspect of the cecum (axial CT image 69, CT series number 3). This is present on the prior study. Vascular/Lymphatic: There is moderate severity calcification of the abdominal aorta and bilateral common iliac arteries, without evidence of aneurysmal dilatation. No enlarged abdominal or pelvic lymph nodes. Reproductive: Prostate is unremarkable. Other: A percutaneous drainage catheter is again seen entering via the right flank. Its distal tip is noted along the  posterior aspect of the right lower quadrant and is unchanged in position when compared to the prior study. Numerous foci of moderate severity contained free air are again seen within this region. Persistent surrounding mesenteric inflammatory fat stranding and mesenteric fluid is also noted. A small amount of posterior pelvic free fluid is seen. This is stable in appearance when compared to the prior study. Musculoskeletal: A moderate to marked amount of para muscular subcutaneous inflammatory fat stranding is seen along the lateral aspects of the left abdominal and pelvic walls. No acute or significant osseous findings. IMPRESSION: 1. Very large area of intra-abdominal fluid and blood within the left upper quadrant, with an appearance worrisome for active bleeding. 2. Large left pleural effusion with a small to moderate sized right pleural effusion. 3. Moderate severity bibasilar consolidation which may represent atelectasis and/or pneumonia. 4. Cholelithiasis. 5. Multiple stable bilateral renal cysts of various sizes. 6. Stable position of the right-sided percutaneous drainage catheter with a moderate amount of surrounding contained free air, fluid and inflammatory fat stranding. Fistulous communication with the adjacent portion of large bowel cannot be excluded. 7. Moderate to marked amount of para muscular subcutaneous inflammatory fat stranding along the lateral aspects of the left abdominal and pelvic walls. 8. Moderate to marked amount of para muscular. 9. Aortic atherosclerosis. Aortic Atherosclerosis (ICD10-I70.0). Electronically Signed   By: Virgina Norfolk M.D.   On: 10/21/2020 01:53   CT ABDOMEN PELVIS WO CONTRAST  Result Date: 10/16/2020 CLINICAL DATA:  Peritonitis or perforation suspected. EXAM: CT ABDOMEN AND PELVIS WITHOUT CONTRAST TECHNIQUE: Multidetector CT imaging of the abdomen and pelvis was performed following the standard protocol without IV contrast. COMPARISON:  10/13/2020 FINDINGS:  Lower chest: Moderate bilateral pleural effusions with bilateral lower lobe atelectasis or consolidation, similar  to prior study. Heart is normal size. Hepatobiliary: No focal hepatic abnormality. Gallbladder contracted and filled with stones. Pancreas: No focal abnormality or ductal dilatation. Spleen: No focal abnormality.  Normal size. Adrenals/Urinary Tract: Gas seen within the urinary bladder, stable since prior study. Several small nonobstructing right renal stones. No hydronephrosis or ureteral stones. Adrenal glands unremarkable. Bilateral exophytic renal cysts noted. There is a drainage catheter now in place within the previously seen perinephric gas and fluid collection. The previously seen fluid collection has decreased in overall size. Gas and contrast material is seen within the perinephric space. Contrast also appears to extend into the right paracolic gutter. This is concerning for possible communication with the right colon. Stomach/Bowel: Mild wall thickening and irregularity along the posterior wall of the ascending colon adjacent to the contrast seen in the right perinephric space and paracolic gutter. No evidence of bowel obstruction. Stomach and small bowel decompressed. NG tube tip is in the stomach. Vascular/Lymphatic: Aortic atherosclerosis. No evidence of aneurysm or adenopathy. Reproductive: No visible focal abnormality. Other: Small to moderate free fluid in the pelvis. Musculoskeletal: No acute bony abnormality. IMPRESSION: Interval placement of drainage catheter into the right perinephric space in the area of previously seen gas and fluid collection. Fluid collection has decreased since prior study. There is now contrast material seen within the perinephric space and extending into the right paracolic gutter. This is concerning for possible fistulous communication to the colon. May consider contrast injection through the drainage catheter under fluoroscopic guidance to assess for enteric  fistula. Moderate bilateral pleural effusions with bibasilar atelectasis or consolidation, unchanged. Cholelithiasis. NG tube in the stomach. Aortic atherosclerosis. Small to moderate free fluid in the pelvis. Electronically Signed   By: Rolm Baptise M.D.   On: 10/16/2020 17:21   CT ABDOMEN PELVIS WO CONTRAST  Result Date: 10/13/2020 CLINICAL DATA:  64 year old male with abdominal abscess or infection. EXAM: CT ABDOMEN AND PELVIS WITHOUT CONTRAST TECHNIQUE: Multidetector CT imaging of the abdomen and pelvis was performed following the standard protocol without IV contrast. COMPARISON:  Renal ultrasound dated 10/11/2020. FINDINGS: Evaluation of this exam is limited in the absence of intravenous contrast. Lower chest: Partially visualized small bilateral pleural effusions with complete consolidative changes of the visualized lower lobes. No intra-abdominal free air. There is a small free fluid in the pelvis. Hepatobiliary: The liver is unremarkable. No intrahepatic biliary dilatation. There are multiple stones in the gallbladder. No pericholecystic fluid or evidence of acute cholecystitis by CT. Pancreas: Unremarkable. No pancreatic ductal dilatation or surrounding inflammatory changes. Spleen: Normal in size without focal abnormality. Adrenals/Urinary Tract: The adrenal glands unremarkable. Multiple nonobstructing right renal calculi measure up to 4 mm in the inferior pole of the right kidney. No hydronephrosis. There is no hydronephrosis or nephrolithiasis on the left. Multiple bilateral renal hypodense lesions are not characterized on this noncontrast CT. There is fluid collection with scattered pockets of air in the right perinephric space and along the right psoas muscle extending down into the pelvis. The fluid collection measures 7.4 x 4.3 cm in greatest axial dimensions and 17 cm in craniocaudal length. There is air within the urinary bladder, possibly related to recent instrumentation. An infectious  process is not excluded. Stomach/Bowel: A feeding tube is noted with tip appearing in the distal stomach. Evaluation however is limited due to respiratory motion. Contrast is noted in the colon. There is distal colonic diverticulosis without active inflammatory changes. No bowel obstruction. The appendix is not visualized with certainty. Vascular/Lymphatic: Moderate aortoiliac  atherosclerotic disease. The IVC is unremarkable. No portal venous gas. There is no adenopathy. Reproductive: The prostate and seminal vesicles are grossly unremarkable. Other: There is mild diffuse subcutaneous edema. No fluid collection. Musculoskeletal: No acute or significant osseous findings. IMPRESSION: 1. Large right perinephric fluid collection and pockets of air concerning for an infectious process/abscess. Clinical correlation is recommended. 2. Cholelithiasis. 3. Nonobstructing right renal calculi. No hydronephrosis. 4. Colonic diverticulosis. No bowel obstruction. 5. Partially visualized small bilateral pleural effusions with complete consolidative changes of the visualized lower lobes. 6. Aortic Atherosclerosis (ICD10-I70.0). Electronically Signed   By: Anner Crete M.D.   On: 10/13/2020 16:42   CT Angio Head W or Wo Contrast  Result Date: 10/27/2020 CLINICAL DATA:  Left-sided numbness and weakness and right gaze preference. EXAM: CT ANGIOGRAPHY HEAD AND NECK CT PERFUSION BRAIN TECHNIQUE: Multidetector CT imaging of the head and neck was performed using the standard protocol during bolus administration of intravenous contrast. Multiplanar CT image reconstructions and MIPs were obtained to evaluate the vascular anatomy. Carotid stenosis measurements (when applicable) are obtained utilizing NASCET criteria, using the distal internal carotid diameter as the denominator. Multiphase CT imaging of the brain was performed following IV bolus contrast injection. Subsequent parametric perfusion maps were calculated using RAPID  software. CONTRAST:  156m OMNIPAQUE IOHEXOL 350 MG/ML SOLN COMPARISON:  None. FINDINGS: CTA NECK FINDINGS Aortic arch: Normal variant aortic arch branching pattern with common origin of the brachiocephalic and left common carotid arteries. Mild arch atherosclerosis without arch vessel origin stenosis. Right carotid system: Patent without evidence of stenosis, dissection, or significant atherosclerosis. Left carotid system: Patent without evidence of stenosis, dissection, or significant atherosclerosis. Vertebral arteries: Patent without evidence of stenosis, dissection, or significant atherosclerosis. Strongly dominant left vertebral artery. Skeleton: Old medial left orbital fracture. 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 Chronic appearing left mastoid air cell opacification. Mild disc degeneration at C4-5. Other neck: No evidence of cervical lymphadenopathy or mass. Upper chest: Clear lung apices. Review of the MIP images confirms the above findings CTA HEAD FINDINGS Anterior circulation: The internal carotid arteries are patent from skull base to carotid termini with atherosclerotic plaque resulting in mild left cavernous segment stenosis. A posteriorly projecting aneurysm arising from the distal right supraclinoid ICA measures 4 mm. ACAs and MCAs are patent without evidence of a significant A1 or left M1 stenosis. There is severe irregular narrowing of the distal right M1 segment over a length of 7 mm. No M2 occlusion is identified. Posterior circulation: The intracranial vertebral arteries are patent to the basilar. Patent AICA and SCA origins are seen bilaterally. The basilar artery is widely patent. There are small right and diminutive or absent left posterior communicating arteries. Both PCAs are patent without evidence of a significant proximal stenosis on the right. There is a moderate mid left P2 stenosis. No aneurysm is identified. Venous sinuses:  As permitted by contrast timing, patent. Anatomic variants: None. Review of the MIP images confirms the above findings CT Brain Perfusion Findings: ASPECTS: 8 CBF (<30%) Volume: 20 mL Perfusion (Tmax>6.0s) volume: 151 mL Mismatch Volume: 131 mL Infarction Location: Core infarct in the right parietal and temporal lobes (with underlying chronic encephalomalacia in the anterior and lateral right temporal lobe as well) with penumbra throughout the majority of the right MCA territory. IMPRESSION: 1. 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 2. Right MCA infarct with extensive penumbra. 3. 4 mm right supraclinoid ICA aneurysm. 4. Widely patent cervical carotid  and vertebral arteries. 5. Aortic Atherosclerosis (ICD10-I70.0). These results were called by telephone at the time of interpretation on 10/15/2020 at 2:45 p.m. to Dr. Rory Percy, who verbally acknowledged these results. Electronically Signed   By: Logan Bores M.D.   On: 10/13/2020 15:09   DG Chest 1 View  Result Date: 10/16/2020 CLINICAL DATA:  Tachycardia EXAM: CHEST  1 VIEW COMPARISON:  10/13/2020 FINDINGS: Unchanged left basilar opacities. Hazy diffuse opacities are also unchanged. Esophageal catheter courses below the field of view. Cardiac silhouette size is normal. IMPRESSION: Unchanged left basilar opacities and hazy diffuse opacities bilaterally. Electronically Signed   By: Ulyses Jarred M.D.   On: 10/16/2020 01:15   DG Chest 1 View  Result Date: 10/09/2020 CLINICAL DATA:  Chest pain. EXAM: CHEST  1 VIEW COMPARISON:  None. FINDINGS: The heart size and mediastinal contours are within normal limits. Both lungs are clear. The visualized skeletal structures are unremarkable. Aortic calcifications are noted. IMPRESSION: No active disease. Electronically Signed   By: Constance Holster M.D.   On: 10/09/2020 19:52   DG Elbow 2 Views Left  Result Date: 10/07/2020 CLINICAL DATA:  Posterior left elbow swelling and decreased range of motion.  Post stroke. No known injury. EXAM: LEFT ELBOW - 2 VIEW COMPARISON:  None. FINDINGS: Extended positioning on the lateral view precludes accurate assessment for joint effusion. No appreciable joint effusion. No fracture or dislocation. No focal osseous lesions. Small enthesophyte at the posterior olecranon. Soft tissue swelling overlying the olecranon. No significant arthropathy. No osseous erosions or periosteal reaction. IMPRESSION: Soft tissue swelling overlying the olecranon, suggesting olecranon bursitis. Small enthesophyte at the posterior olecranon. No acute osseous abnormality. Electronically Signed   By: Ilona Sorrel M.D.   On: 10/07/2020 16:09   DG Abd 1 View  Result Date: 10/15/2020 CLINICAL DATA:  Abdominal pain EXAM: ABDOMEN - 1 VIEW COMPARISON:  Abdomen radiograph October 13, 2020 and CT abdomen and pelvis October 13, 2020 FINDINGS: There is a drain in the right lower quadrant. Feeding tube tip is in the distal stomach region. There are foci of contrast in colon and appendix. There is no bowel dilatation or air-fluid level to suggest bowel obstruction. No free air is appreciable on supine examination. IMPRESSION: No bowel obstruction. No free air evident on supine examination. Tube and catheter positions as noted. Electronically Signed   By: Lowella Grip III M.D.   On: 10/15/2020 13:07   DG Abd 1 View  Result Date: 10/13/2020 CLINICAL DATA:  Abdominal distension EXAM: ABDOMEN - 1 VIEW COMPARISON:  October 10, 2020 FINDINGS: Flocculated enteric contrast is seen within the colon. Moderate predominately RIGHT-sided colonic stool burden. Gaseous distension of loops of colon without dilation. Feeding tube tip terminates over the distal stomach. Probable nephrolithiasis. Visualized osseous structures are unremarkable. IMPRESSION: Feeding tube tip terminates over the distal stomach. Electronically Signed   By: Valentino Saxon MD   On: 10/13/2020 14:30   DG Abd 1 View  Result Date:  10/12/2020 CLINICAL DATA:  Check gastric catheter placement EXAM: ABDOMEN - 1 VIEW COMPARISON:  None. FINDINGS: Gastric catheter is noted extending into the stomach. It appears coiled upon itself within the distal stomach with kinking at the proximal side port. This could be withdrawn slightly as clinically indicated. Contrast from recent arteriography is noted. IMPRESSION: Gastric catheter within the stomach kinked at the proximal side port. This could be withdrawn slightly S necessary. Electronically Signed   By: Inez Catalina M.D.   On: 10/03/2020 20:21  CT HEAD WO CONTRAST  Result Date: 10/10/2020 CLINICAL DATA:  Stroke follow-up.  Right MCA recanalization today. EXAM: CT HEAD WITHOUT CONTRAST TECHNIQUE: Contiguous axial images were obtained from the base of the skull through the vertex without intravenous contrast. COMPARISON:  CT head approximately 4 hours prior to the current study. FINDINGS: Brain: Right lateral temporal lobe hypodensity unchanged in size. Decrease hyperdensity within this likely due to resolving contrast staining rather than hemorrhage. Small area of hypodensity in the right parietal cortex has developed since the prior study compatible with acute infarct. Ventricle size normal. No midline shift. Mild chronic microvascular ischemic change in the white matter. Vascular: Right MCA stent.  Negative for hyperdense vessel Skull: Negative Sinuses/Orbits: Mucosal edema in the paranasal sinuses. The patient is intubated with an NG tube in place. Other: None IMPRESSION: Right temporal lobe hypodensity compatible with acute infarct. Decreased hyperdensity within this stroke compared with 4 hours previously compatible with contrast staining rather than hemorrhage. Small area of acute infarct in the right parietal lobe now evident. Right MCA stent. Electronically Signed   By: Franchot Gallo M.D.   On: 10/03/2020 22:16   CT HEAD WO CONTRAST  Result Date: 10/23/2020 CLINICAL DATA:  Stroke.  Post  intervention. EXAM: CT HEAD WITHOUT CONTRAST TECHNIQUE: Contiguous axial images were obtained from the base of the skull through the vertex without intravenous contrast. COMPARISON:  CT angio head and neck 10/14/2020 FINDINGS: Brain: Hypodensity in the right lateral temporal lobe now shows mild hyperdensity which may be hemorrhage or contrast enhancement. If this is blood, this is a mild amount of blood. This therefore likely was an area of acute infarct. There has been interval stenting of the right middle cerebral artery. No other areas of acute hemorrhage or infarct. Patchy white matter hypodensity bilaterally compatible chronic microvascular ischemia. Ventricle size normal. Vascular: Normal vascular enhancement following angiography. Skull: Negative Sinuses/Orbits: Mucosal edema paranasal sinuses with near complete opacification right maxillary sinus. Negative orbit Other: None IMPRESSION: Hypodensity in the right lateral temporal lobe now shows mild hemorrhage or contrast enhancement. This is most consistent with acute infarct. There has been interval stenting of the right middle cerebral artery. These results were called by telephone at the time of interpretation on 10/23/2020 at 6:28 pm to provider Bhagat, who verbally acknowledged these results. T Electronically Signed   By: Franchot Gallo M.D.   On: 10/19/2020 18:26   CT HEAD WO CONTRAST  Result Date: 10/09/2020 CLINICAL DATA:  Dizziness. Left upper extremity weakness. Headache. EXAM: CT HEAD WITHOUT CONTRAST TECHNIQUE: Contiguous axial images were obtained from the base of the skull through the vertex without intravenous contrast. COMPARISON:  None. FINDINGS: Brain: Minimally enlarged ventricles and subarachnoid spaces. Mild patchy white matter low density in both cerebral hemispheres. No intracranial hemorrhage, mass lesion or CT evidence of acute infarction. Vascular: No hyperdense vessel or unexpected calcification. Skull: Normal. Negative for  fracture or focal lesion. Sinuses/Orbits: Unremarkable. Other: None. IMPRESSION: 1. No acute abnormality. 2. Minimal diffuse cerebral and cerebellar atrophy. 3. Mild chronic small vessel white matter ischemic changes in both cerebral hemispheres. Electronically Signed   By: Claudie Revering M.D.   On: 10/14/2020 13:01   CT Angio Neck W and/or Wo Contrast  Result Date: 10/14/2020 CLINICAL DATA:  Left-sided numbness and weakness and right gaze preference. EXAM: CT ANGIOGRAPHY HEAD AND NECK CT PERFUSION BRAIN TECHNIQUE: Multidetector CT imaging of the head and neck was performed using the standard protocol during bolus administration of intravenous contrast. Multiplanar CT  image reconstructions and MIPs were obtained to evaluate the vascular anatomy. Carotid stenosis measurements (when applicable) are obtained utilizing NASCET criteria, using the distal internal carotid diameter as the denominator. Multiphase CT imaging of the brain was performed following IV bolus contrast injection. Subsequent parametric perfusion maps were calculated using RAPID software. CONTRAST:  122m OMNIPAQUE IOHEXOL 350 MG/ML SOLN COMPARISON:  None. FINDINGS: CTA NECK FINDINGS Aortic arch: Normal variant aortic arch branching pattern with common origin of the brachiocephalic and left common carotid arteries. Mild arch atherosclerosis without arch vessel origin stenosis. Right carotid system: Patent without evidence of stenosis, dissection, or significant atherosclerosis. Left carotid system: Patent without evidence of stenosis, dissection, or significant atherosclerosis. Vertebral arteries: Patent without evidence of stenosis, dissection, or significant atherosclerosis. Strongly dominant left vertebral artery. Skeleton: Old medial left orbital fracture. 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 Chronic appearing left mastoid air cell opacification. Mild disc degeneration at C4-5. Other neck:  No evidence of cervical lymphadenopathy or mass. Upper chest: Clear lung apices. Review of the MIP images confirms the above findings CTA HEAD FINDINGS Anterior circulation: The internal carotid arteries are patent from skull base to carotid termini with atherosclerotic plaque resulting in mild left cavernous segment stenosis. A posteriorly projecting aneurysm arising from the distal right supraclinoid ICA measures 4 mm. ACAs and MCAs are patent without evidence of a significant A1 or left M1 stenosis. There is severe irregular narrowing of the distal right M1 segment over a length of 7 mm. No M2 occlusion is identified. Posterior circulation: The intracranial vertebral arteries are patent to the basilar. Patent AICA and SCA origins are seen bilaterally. The basilar artery is widely patent. There are small right and diminutive or absent left posterior communicating arteries. Both PCAs are patent without evidence of a significant proximal stenosis on the right. There is a moderate mid left P2 stenosis. No aneurysm is identified. Venous sinuses: As permitted by contrast timing, patent. Anatomic variants: None. Review of the MIP images confirms the above findings CT Brain Perfusion Findings: ASPECTS: 8 CBF (<30%) Volume: 20 mL Perfusion (Tmax>6.0s) volume: 151 mL Mismatch Volume: 131 mL Infarction Location: Core infarct in the right parietal and temporal lobes (with underlying chronic encephalomalacia in the anterior and lateral right temporal lobe as well) with penumbra throughout the majority of the right MCA territory. IMPRESSION: 1. 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 2. Right MCA infarct with extensive penumbra. 3. 4 mm right supraclinoid ICA aneurysm. 4. Widely patent cervical carotid and vertebral arteries. 5. Aortic Atherosclerosis (ICD10-I70.0). These results were called by telephone at the time of interpretation on 10/17/2020 at 2:45 p.m. to Dr. ARory Percy who verbally acknowledged these  results. Electronically Signed   By: ALogan BoresM.D.   On: 10/24/2020 15:09   CT CHEST W CONTRAST  Result Date: 10/21/2020 CLINICAL DATA:  Dropping hematocrit, large hemothorax following thoracentesis yesterday EXAM: CT CHEST, ABDOMEN, AND PELVIS WITH CONTRAST TECHNIQUE: Multidetector CT imaging of the chest, abdomen and pelvis was performed following the standard protocol during bolus administration of intravenous contrast. CONTRAST:  1060mOMNIPAQUE IOHEXOL 300 MG/ML  SOLN COMPARISON:  CT abdomen pelvis, 10/21/2020, 1:09 a.m. FINDINGS: CT CHEST FINDINGS Cardiovascular: Aortic atherosclerosis. Right upper extremity PICC, tip near the superior cavoatrial junction. Normal heart size. No pericardial effusion. Mediastinum/Nodes: No enlarged mediastinal, hilar, or axillary lymph nodes. Thyroid is normal. Endotracheal and esophagogastric intubation. Lungs/Pleura: Large left, moderate right pleural effusions and associated atelectasis or consolidation. There is  extensive, heterogeneous high density in the inferior aspect of the left pleural effusion, consistent with a large hematoma, given relatively circumscribed appearing appears to be extrapleural and measures approximately 15.3 x 12.1 x 8.5 cm (series 6, image 41). There appears to be a small focus of contrast extravasation adjacent to the inferior margin of the posterolateral left tenth rib (series 6, image 64). Musculoskeletal: No chest wall mass or suspicious bone lesions identified. CT ABDOMEN PELVIS FINDINGS Hepatobiliary: No solid liver abnormality is seen. Gallstones and sludge in the gallbladder. No gallbladder wall thickening, or biliary dilatation. Pancreas: Unremarkable. No pancreatic ductal dilatation or surrounding inflammatory changes. Spleen: Normal in size without significant abnormality. Adrenals/Urinary Tract: Adrenal glands are unremarkable. Multiple nonobstructive right renal calculi. No left-sided calculi, ureteral calculi, or  hydronephrosis. Small air-fluid level within the urinary bladder, similar to prior. Stomach/Bowel: Stomach is within normal limits. Appendix is not clearly visualized. No evidence of bowel wall thickening, distention, or inflammatory changes. Vascular/Lymphatic: Aortic atherosclerosis. No enlarged abdominal or pelvic lymph nodes. Reproductive: No mass or other abnormality. Other: Anasarca. Redemonstrated percutaneous pigtail drainage catheter positioned in a right retroperitoneal air and fluid collection, not significantly changed compared to prior examination (series 3, image 79). Small volume fluid attenuation ascites in the low pelvis (series 3, image 105). Musculoskeletal: No acute or significant osseous findings. IMPRESSION: 1. There is a large left pleural effusion, which contains a circumscribed appearing region of heterogeneous high density inferiorly measuring approximately 15.3 x 12.1 x 8.5 cm. This is most consistent with a large extrapleural hematoma and there appears to be a small focus of contrast extravasation adjacent to the inferior margin of the posterolateral left tenth rib, most consistent with an intercostal artery extravasation following thoracentesis. This is not significantly changed compared to prior same day noncontrast examination. 2. Moderate right pleural effusion. 3. Redemonstrated percutaneous pigtail drainage catheter positioned in a right retroperitoneal air and fluid collection, not significantly changed compared to prior examination. 4. Small volume fluid attenuation ascites in the low pelvis. 5. Anasarca. 6. Other chronic and incidental findings as above. Aortic Atherosclerosis (ICD10-I70.0). Findings discussed by telephone with Dr. Lenore Cordia at 1:15 p.m., 10/21/2020. Electronically Signed   By: Eddie Candle M.D.   On: 10/21/2020 14:01   MR ANGIO HEAD WO CONTRAST  Result Date: 10/06/2020 CLINICAL DATA:  Stroke, follow-up. EXAM: MRI HEAD WITHOUT CONTRAST MRA HEAD WITHOUT CONTRAST  TECHNIQUE: Multiplanar, multiecho pulse sequences of the brain and surrounding structures were obtained without intravenous contrast. Angiographic images of the head were obtained using MRA technique without contrast. COMPARISON:  Noncontrast head CT 10/03/2020. CT head 10/27/2020 performed at 6:03 p.m. CT angiogram head/neck performed 10/04/2020. FINDINGS: MRI HEAD FINDINGS Brain: Cerebral volume is normal for age. There is fairly extensive patchy multifocal cortical and subcortical restricted diffusion within the right MCA and watershed territories consistent with acute/early subacute infarction. Acute infarcts are present within the right frontal, parietal, occipital and temporal lobes. Patchy small acute/early subacute infarcts are also present within the right basal ganglia. A subtle acute/early subacute infarct is also present within the posteromedial right thalamus. Superimposed subacute/chronic infarction within the right temporal lobe with cortical laminar necrosis at this site. Additional small acute/early subacute infarcts within the left basal ganglia. Corresponding T2/FLAIR hyperintensity at these sites. No significant mass effect. No midline shift Background moderate cerebral white matter chronic small vessel ischemic disease. Chronic right basal ganglia lacunar infarct. 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 No evidence  of intracranial mass. No extra-axial fluid collection. No midline shift. Vascular: Reported below. Skull and upper cervical spine: No focal marrow lesion. Sinuses/Orbits: Visualized orbits show no acute finding. Complete T2 hyperintense opacification of the right maxillary sinus. Moderate ethmoid sinus mucosal thickening. Other: Left mastoid effusion. MRA HEAD FINDINGS The intracranial internal carotid arteries are patent. Interval stenting of the M1/M2 right middle cerebral  artery with associated stent artifact. There is flow related signal proximal and distal to the stent suggestive of stent patency. Flow related signal within M2 and more distal right MCA branch vessels. The M1 left MCA is patent without stenosis. No left M2 proximal branch occlusion or high-grade proximal stenosis. 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 The intracranial vertebral arteries are patent. The basilar artery is patent. The posterior cerebral arteries are patent. Mild atherosclerotic narrowing of the proximal P2 left PCA (series 1060, image 19). IMPRESSION: MRI brain: 1. Fairly extensive patchy acute/early subacute infarcts within the right MCA/watershed territory. 2. Additional small acute/early subacute infarcts within the dorsal right thalamus and left basal ganglia. 3. Background moderate chronic small vessel ischemic disease. MRA head: 1. Interval stenting of the M1/M2 right middle cerebral artery. Flow related signal is present proximal and distal to the stent suggestive of stent patency. 2. 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 Electronically Signed   By: Kellie Simmering DO   On: 10/06/2020 13:43   MR BRAIN WO CONTRAST  Result Date: 10/06/2020 CLINICAL DATA:  Stroke, follow-up. EXAM: MRI HEAD WITHOUT CONTRAST MRA HEAD WITHOUT CONTRAST TECHNIQUE: Multiplanar, multiecho pulse sequences of the brain and surrounding structures were obtained without intravenous contrast. Angiographic images of the head were obtained using MRA technique without contrast. COMPARISON:  Noncontrast head CT 10/23/2020. CT head 10/03/2020 performed at 6:03 p.m. CT angiogram head/neck performed 10/10/2020. FINDINGS: MRI HEAD FINDINGS Brain: Cerebral volume is normal for age. There is fairly extensive patchy multifocal cortical and subcortical restricted diffusion within the right MCA and watershed territories consistent with acute/early subacute  infarction. Acute infarcts are present within the right frontal, parietal, occipital and temporal lobes. Patchy small acute/early subacute infarcts are also present within the right basal ganglia. A subtle acute/early subacute infarct is also present within the posteromedial right thalamus. Superimposed subacute/chronic infarction within the right temporal lobe with cortical laminar necrosis at this site. Additional small acute/early subacute infarcts within the left basal ganglia. Corresponding T2/FLAIR hyperintensity at these sites. No significant mass effect. No midline shift Background moderate cerebral white matter chronic small vessel ischemic disease. Chronic right basal ganglia lacunar infarct. 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 No evidence of intracranial mass. No extra-axial fluid collection. No midline shift. Vascular: Reported below. Skull and upper cervical spine: No focal marrow lesion. Sinuses/Orbits: Visualized orbits show no acute finding. Complete T2 hyperintense opacification of the right maxillary sinus. Moderate ethmoid sinus mucosal thickening. Other: Left mastoid effusion. MRA HEAD FINDINGS The intracranial internal carotid arteries are patent. Interval stenting of the M1/M2 right middle cerebral artery with associated stent artifact. There is flow related signal proximal and distal to the stent suggestive of stent patency. Flow related signal within M2 and more distal right MCA branch vessels. The M1 left MCA is patent without stenosis. No left M2 proximal branch occlusion or high-grade proximal stenosis. 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 The intracranial vertebral arteries are patent. The basilar artery is  patent. The posterior cerebral arteries are patent. Mild atherosclerotic narrowing of the proximal P2 left PCA (series 1060,  image 19). IMPRESSION: MRI brain: 1. Fairly extensive patchy acute/early subacute infarcts within the right MCA/watershed territory. 2. Additional small acute/early subacute infarcts within the dorsal right thalamus and left basal ganglia. 3. Background moderate chronic small vessel ischemic disease. MRA head: 1. Interval stenting of the M1/M2 right middle cerebral artery. Flow related signal is present proximal and distal to the stent suggestive of stent patency. 2. 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 Electronically Signed   By: Kellie Simmering DO   On: 10/06/2020 13:43   CT ABDOMEN PELVIS W CONTRAST  Result Date: 10/21/2020 CLINICAL DATA:  Dropping hematocrit, large hemothorax following thoracentesis yesterday EXAM: CT CHEST, ABDOMEN, AND PELVIS WITH CONTRAST TECHNIQUE: Multidetector CT imaging of the chest, abdomen and pelvis was performed following the standard protocol during bolus administration of intravenous contrast. CONTRAST:  117m OMNIPAQUE IOHEXOL 300 MG/ML  SOLN COMPARISON:  CT abdomen pelvis, 10/21/2020, 1:09 a.m. FINDINGS: CT CHEST FINDINGS Cardiovascular: Aortic atherosclerosis. Right upper extremity PICC, tip near the superior cavoatrial junction. Normal heart size. No pericardial effusion. Mediastinum/Nodes: No enlarged mediastinal, hilar, or axillary lymph nodes. Thyroid is normal. Endotracheal and esophagogastric intubation. Lungs/Pleura: Large left, moderate right pleural effusions and associated atelectasis or consolidation. There is extensive, heterogeneous high density in the inferior aspect of the left pleural effusion, consistent with a large hematoma, given relatively circumscribed appearing appears to be extrapleural and measures approximately 15.3 x 12.1 x 8.5 cm (series 6, image 41). There appears to be a small focus of contrast extravasation adjacent to the inferior margin of the posterolateral left tenth rib (series 6, image 64).  Musculoskeletal: No chest wall mass or suspicious bone lesions identified. CT ABDOMEN PELVIS FINDINGS Hepatobiliary: No solid liver abnormality is seen. Gallstones and sludge in the gallbladder. No gallbladder wall thickening, or biliary dilatation. Pancreas: Unremarkable. No pancreatic ductal dilatation or surrounding inflammatory changes. Spleen: Normal in size without significant abnormality. Adrenals/Urinary Tract: Adrenal glands are unremarkable. Multiple nonobstructive right renal calculi. No left-sided calculi, ureteral calculi, or hydronephrosis. Small air-fluid level within the urinary bladder, similar to prior. Stomach/Bowel: Stomach is within normal limits. Appendix is not clearly visualized. No evidence of bowel wall thickening, distention, or inflammatory changes. Vascular/Lymphatic: Aortic atherosclerosis. No enlarged abdominal or pelvic lymph nodes. Reproductive: No mass or other abnormality. Other: Anasarca. Redemonstrated percutaneous pigtail drainage catheter positioned in a right retroperitoneal air and fluid collection, not significantly changed compared to prior examination (series 3, image 79). Small volume fluid attenuation ascites in the low pelvis (series 3, image 105). Musculoskeletal: No acute or significant osseous findings. IMPRESSION: 1. There is a large left pleural effusion, which contains a circumscribed appearing region of heterogeneous high density inferiorly measuring approximately 15.3 x 12.1 x 8.5 cm. This is most consistent with a large extrapleural hematoma and there appears to be a small focus of contrast extravasation adjacent to the inferior margin of the posterolateral left tenth rib, most consistent with an intercostal artery extravasation following thoracentesis. This is not significantly changed compared to prior same day noncontrast examination. 2. Moderate right pleural effusion. 3. Redemonstrated percutaneous pigtail drainage catheter positioned in a right  retroperitoneal air and fluid collection, not significantly changed compared to prior examination. 4. Small volume fluid attenuation ascites in the low pelvis. 5. Anasarca. 6. Other chronic and incidental findings as above. Aortic Atherosclerosis (ICD10-I70.0). Findings discussed by telephone with Dr. BLenore Cordiaat  1:15 p.m., 10/21/2020. Electronically Signed   By: Eddie Candle M.D.   On: 10/21/2020 14:01   US RENAL  Result Date: 10/11/2020 CLINICAL DATA:  Acute renal insufficiency EXAM: RENAL / URINARY TRACT ULTRASOUND COMPLETE COMPARISON:  None. FINDINGS: Right Kidney: Renal measurements: 10.5 x 5.4 x 4.9 cm = volume: 145 mL. Echogenicity within normal limits. No mass or hydronephrosis visualized. 1.8 cm simple exophytic cortical cyst noted. Left Kidney: Renal measurements: 11.4 x 6.2 x 4.8 cm = volume: 176 mL. Echogenicity within normal limits. No mass or hydronephrosis visualized. 2.7 cm exophytic simple cortical cyst noted. Bladder: Decompressed and not well visualized. Other: None. IMPRESSION: Normal renal sonogram Electronically Signed   By: Fidela Salisbury MD   On: 10/11/2020 21:01   IR Intra Cran Stent  Result Date: 10/17/2020 INDICATION: New onset of left-sided weakness, associated with facial weakness, and right gaze preference. CT of the brain demonstrating pre occlusive stenosis of the mid right M1 segment due to thrombus +/-intracranial arteriosclerosis. EXAM: 1. EMERGENT LARGE VESSEL OCCLUSION THROMBOLYSIS (anterior CIRCULATION) COMPARISON:  CT angiogram of head and neck of October 05, 2020. MEDICATIONS: The antibiotic was administered within 1 hour of the procedure ANESTHESIA/SEDATION: General anesthesia CONTRAST:  Isovue 300 approximately 125 mL. FLUOROSCOPY TIME:  Fluoroscopy Time: 39 minutes 0 seconds (1592 mGy). COMPLICATIONS: None immediate. TECHNIQUE: Following a full explanation of the procedure along with the potential associated complications, an informed witnessed consent was  obtained. The risks of intracranial hemorrhage of 10%, worsening neurological deficit, ventilator dependency, death and inability to revascularize were all reviewed in detail with the patient's son. The patient was then put under general anesthesia by the Department of Anesthesiology at [facility]. The right groin was prepped and draped in the usual sterile fashion. Thereafter using modified Seldinger technique, transfemoral access into the right common femoral artery was obtained without difficulty. Over a 0.035 inch guidewire an 8 French 25 cm Pinnacle sheath was inserted. Through this, and also over a 0.035 inch guidewire a 5 [place] JB 1 catheter was advanced to the aortic arch region and selectively positioned in the right common carotid artery. FINDINGS: The right common carotid arteriogram demonstrates the right external carotid artery and its major branches to be widely patent. The right internal carotid artery at the bulb to the cranial skull base is also widely patent. The petrous, the cavernous and the supraclinoid segments are widely patent. The right anterior cerebral artery opacifies into the capillary and venous phases. Prompt cross filling via the anterior communicating artery of the left anterior cerebral A2 segment is also seen. The right middle cerebral artery demonstrates a severe tapered near occlusive stenosis of the mid right A1 segment. More distally delayed opacification of the right middle cerebral trifurcation branches is seen into the capillary and venous phases. There is delayed hemodynamic flow through the right middle cerebral artery distribution. PROCEDURE: The diagnostic JB 1 catheter in the right common carotid artery was exchanged over a 0.035 inch 300 cm Rosen exchange guidewire for an 087 balloon guide catheter which had been prepped with 50% contrast and 50% heparinized saline infusion. The guidewire was removed. Good aspiration obtained from the hub of the balloon guide  catheter. A gentle control arteriogram performed through this demonstrated no change in the extracranial or intracranial circulations. Over a 0.035 inch Roadrunner guidewire, a 6 French 132 cm Catalyst guide catheter was then advanced and positioned in the horizontal petrous segment of the right internal carotid artery. The guidewire was removed. Good  aspiration was obtained from the hub of the 6 [place] guide sheath. A control arteriogram was then performed intracranially. 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 Over a 0.014 inch standard Synchro micro guidewire with a J-tip configuration, an 021 Trevo ProVue microcatheter was advanced to the supraclinoid right ICA. Using a torque device the guidewire was then advanced without difficulty through the high-grade stenosis into the inferior division of the right middle cerebral artery followed by the microcatheter. The guidewire was removed. Good aspiration obtained from the hub of the microcatheter. A gentle control arteriogram performed through the microcatheter demonstrated safe position of the tip of the microcatheter. This was then connected to continuous heparinized saline infusion. A Tiger 21 retrieval device was then prepped and purged with heparinized saline infusion then advanced to the distal end of the microcatheter. The O ring on the delivery microcatheter was loosened. The Tiger 21 was then deployed by retrieving the delivery microcatheter slowly first delivering the distal micro guidewire and then the proximal retrieval device. The proximal end of the retrieval device was positioned at the proximal aspect of the occlusion. Proximal flow arrest was initiated in the right internal carotid artery. Thereafter, the diameter of the retrieval device was then increased and decreased for cycle. The microcatheter was  advanced to pinch the proximal marker on the retrieval device. The aspiration catheter was then brought just proximal to the retrieval device. Constant aspiration was performed at the hub of the 6 [place] Catalyst guide catheter with a Penumbra aspiration pump for approximately a minute and half followed by the retrieval device, the microcatheter, and the 6 [place] Catalyst guide catheter which were locked. Pieces of clot were seen entangled in the retrieval device. Flow arrest was reversed. Control arteriogram performed through the balloon guide in the right internal carotid artery demonstrated complete revascularization of the right middle cerebral artery distribution achieving a TICI 3 revascularization. The right middle cerebral artery was now widely patent with a patency of 75% at the site of previous occlusion. Also seen was related plaque against the wall of the native vessel. A control arteriogram performed approximately 3 minutes later demonstrated progressive decrease in the caliber with slow flow in the right middle cerebral artery distribution. This progressed to complete occlusion. This prompted the placement of a rescue stent. Measurements were performed of the right middle cerebral artery proximally and distally. An 021 microcatheter was then advanced inside of a Catalyst 6 [place] guide catheter over a 0.014 inch standard Synchro micro guidewire. The micro guidewire was then advanced without difficulty into the distal M2 M3 region of the inferior division followed by the microcatheter. The guidewire was removed. Good aspiration obtained from microcatheter. This was then connected to continuous heparinized saline infusion. A 4 mm x 24 mm Atlas Neuroform stent was then advanced to the distal end of the microcatheter. The proximal and the distal landing zone markers were aligned in the appropriate position relative to the site of the severe stenosis. This was then deployed in the usual manner without any  difficulty. A control arteriogram performed following the delivery of the stent demonstrated excellent apposition with significantly improved caliber and flow through the stented segment. There continued to be a mild to modest waist which remained stable over the next 20 minutes as per diagnostic arteriograms performed through the 6 [place] Catalyst guide catheter in the right internal carotid artery.  A TICI 3 revascularization had been re-established. A 30 minute control arteriogram performed through the 6 [place] Catalyst guide catheter in the right internal carotid artery continued to demonstrate excellent flow through the stented segment of the right middle cerebral artery and the right anterior cerebral artery distributions. This was then retrieved and removed. A final control arteriogram performed through the balloon guide catheter in the right common carotid artery demonstrated wide patency of the right internal carotid artery proximally and distally. The balloon guide was removed. A short 8 French sheath was then positioned in the right common femoral puncture site and connected to continuous heparinized saline infusion. Distal pulses remained Dopplerable in the dorsalis pedis, and the posterior tibial regions bilaterally. A CT of the brain performed on the table demonstrated no evidence of hemorrhage, mass effect or midline shift. Patient was left intubated and transferred to the ICU for post thrombectomy management. Patient was loaded with 81 mg of aspirin, and 180 mg of Brilinta via orogastric tube prior to the placement of the stent. Also the patient was given a loading dose of cangrelor followed by a 4 hour infusion with a CT to follow at the end. IMPRESSION: Status post endovascular complete revascularization of the near complete occlusion of the right middle cerebral M1 segment with 1 pass with the Tiger 21 retrieval device and aspiration achieving a TICI 3 revascularization, followed by placement of a  rescue stent for reocclusion again achieving a TICI 3 revascularization. PLAN: Follow-up in the clinic 4 to 6 weeks post discharge. Electronically Signed   By: Luanne Bras M.D.   On: 10/08/2020 11:19   IR CT Head Ltd  Result Date: 10/17/2020 INDICATION: New onset of left-sided weakness, associated with facial weakness, and right gaze preference. CT of the brain demonstrating pre occlusive stenosis of the mid right M1 segment due to thrombus +/-intracranial arteriosclerosis. EXAM: 1. EMERGENT LARGE VESSEL OCCLUSION THROMBOLYSIS (anterior CIRCULATION) COMPARISON:  CT angiogram of head and neck of October 05, 2020. MEDICATIONS: The antibiotic was administered within 1 hour of the procedure ANESTHESIA/SEDATION: General anesthesia CONTRAST:  Isovue 300 approximately 125 mL. FLUOROSCOPY TIME:  Fluoroscopy Time: 39 minutes 0 seconds (1592 mGy). COMPLICATIONS: None immediate. TECHNIQUE: Following a full explanation of the procedure along with the potential associated complications, an informed witnessed consent was obtained. The risks of intracranial hemorrhage of 10%, worsening neurological deficit, ventilator dependency, death and inability to revascularize were all reviewed in detail with the patient's son. The patient was then put under general anesthesia by the Department of Anesthesiology at [facility]. The right groin was prepped and draped in the usual sterile fashion. Thereafter using modified Seldinger technique, transfemoral access into the right common femoral artery was obtained without difficulty. Over a 0.035 inch guidewire an 8 French 25 cm Pinnacle sheath was inserted. Through this, and also over a 0.035 inch guidewire a 5 [place] JB 1 catheter was advanced to the aortic arch region and selectively positioned in the right common carotid artery. FINDINGS: The right common carotid arteriogram demonstrates the right external carotid artery and its major branches to be widely patent. The  right internal carotid artery at the bulb to the cranial skull base is also widely patent. The petrous, the cavernous and the supraclinoid segments are widely patent. The right anterior cerebral artery opacifies into the capillary and venous phases. Prompt cross filling via the anterior communicating artery of the left anterior cerebral A2 segment is also seen. The right middle cerebral artery demonstrates a severe  tapered near occlusive stenosis of the mid right A1 segment. More distally delayed opacification of the right middle cerebral trifurcation branches is seen into the capillary and venous phases. There is delayed hemodynamic flow through the right middle cerebral artery distribution. PROCEDURE: The diagnostic JB 1 catheter in the right common carotid artery was exchanged over a 0.035 inch 300 cm Rosen exchange guidewire for an 087 balloon guide catheter which had been prepped with 50% contrast and 50% heparinized saline infusion. The guidewire was removed. Good aspiration obtained from the hub of the balloon guide catheter. A gentle control arteriogram performed through this demonstrated no change in the extracranial or intracranial circulations. Over a 0.035 inch Roadrunner guidewire, a 6 French 132 cm Catalyst guide catheter was then advanced and positioned in the horizontal petrous segment of the right internal carotid artery. The guidewire was removed. Good aspiration was obtained from the hub of the 6 [place] guide sheath. A control arteriogram was then performed intracranially. 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 Over a 0.014 inch standard Synchro micro guidewire with a J-tip configuration, an 021 Trevo ProVue microcatheter was advanced to the supraclinoid right ICA. Using a torque device the guidewire was then advanced without difficulty through the  high-grade stenosis into the inferior division of the right middle cerebral artery followed by the microcatheter. The guidewire was removed. Good aspiration obtained from the hub of the microcatheter. A gentle control arteriogram performed through the microcatheter demonstrated safe position of the tip of the microcatheter. This was then connected to continuous heparinized saline infusion. A Tiger 21 retrieval device was then prepped and purged with heparinized saline infusion then advanced to the distal end of the microcatheter. The O ring on the delivery microcatheter was loosened. The Tiger 21 was then deployed by retrieving the delivery microcatheter slowly first delivering the distal micro guidewire and then the proximal retrieval device. The proximal end of the retrieval device was positioned at the proximal aspect of the occlusion. Proximal flow arrest was initiated in the right internal carotid artery. Thereafter, the diameter of the retrieval device was then increased and decreased for cycle. The microcatheter was advanced to pinch the proximal marker on the retrieval device. The aspiration catheter was then brought just proximal to the retrieval device. Constant aspiration was performed at the hub of the 6 [place] Catalyst guide catheter with a Penumbra aspiration pump for approximately a minute and half followed by the retrieval device, the microcatheter, and the 6 [place] Catalyst guide catheter which were locked. Pieces of clot were seen entangled in the retrieval device. Flow arrest was reversed. Control arteriogram performed through the balloon guide in the right internal carotid artery demonstrated complete revascularization of the right middle cerebral artery distribution achieving a TICI 3 revascularization. The right middle cerebral artery was now widely patent with a patency of 75% at the site of previous occlusion. Also seen was related plaque against the wall of the native vessel. A control  arteriogram performed approximately 3 minutes later demonstrated progressive decrease in the caliber with slow flow in the right middle cerebral artery distribution. This progressed to complete occlusion. This prompted the placement of a rescue stent. Measurements were performed of the right middle cerebral artery proximally and distally. An 021 microcatheter was then advanced inside of a Catalyst 6 [place] guide catheter over a 0.014 inch standard Synchro micro guidewire. The micro  guidewire was then advanced without difficulty into the distal M2 M3 region of the inferior division followed by the microcatheter. The guidewire was removed. Good aspiration obtained from microcatheter. This was then connected to continuous heparinized saline infusion. A 4 mm x 24 mm Atlas Neuroform stent was then advanced to the distal end of the microcatheter. The proximal and the distal landing zone markers were aligned in the appropriate position relative to the site of the severe stenosis. This was then deployed in the usual manner without any difficulty. A control arteriogram performed following the delivery of the stent demonstrated excellent apposition with significantly improved caliber and flow through the stented segment. There continued to be a mild to modest waist which remained stable over the next 20 minutes as per diagnostic arteriograms performed through the 6 [place] Catalyst guide catheter in the right internal carotid artery. A TICI 3 revascularization had been re-established. A 30 minute control arteriogram performed through the 6 [place] Catalyst guide catheter in the right internal carotid artery continued to demonstrate excellent flow through the stented segment of the right middle cerebral artery and the right anterior cerebral artery distributions. This was then retrieved and removed. A final control arteriogram performed through the balloon guide catheter in the right common carotid artery demonstrated wide  patency of the right internal carotid artery proximally and distally. The balloon guide was removed. A short 8 French sheath was then positioned in the right common femoral puncture site and connected to continuous heparinized saline infusion. Distal pulses remained Dopplerable in the dorsalis pedis, and the posterior tibial regions bilaterally. A CT of the brain performed on the table demonstrated no evidence of hemorrhage, mass effect or midline shift. Patient was left intubated and transferred to the ICU for post thrombectomy management. Patient was loaded with 81 mg of aspirin, and 180 mg of Brilinta via orogastric tube prior to the placement of the stent. Also the patient was given a loading dose of cangrelor followed by a 4 hour infusion with a CT to follow at the end. IMPRESSION: Status post endovascular complete revascularization of the near complete occlusion of the right middle cerebral M1 segment with 1 pass with the Tiger 21 retrieval device and aspiration achieving a TICI 3 revascularization, followed by placement of a rescue stent for reocclusion again achieving a TICI 3 revascularization. PLAN: Follow-up in the clinic 4 to 6 weeks post discharge. Electronically Signed   By: Luanne Bras M.D.   On: 10/08/2020 11:19   CT CEREBRAL PERFUSION W CONTRAST  Result Date: 10/10/2020 CLINICAL DATA:  Left-sided numbness and weakness and right gaze preference. EXAM: CT ANGIOGRAPHY HEAD AND NECK CT PERFUSION BRAIN TECHNIQUE: Multidetector CT imaging of the head and neck was performed using the standard protocol during bolus administration of intravenous contrast. Multiplanar CT image reconstructions and MIPs were obtained to evaluate the vascular anatomy. Carotid stenosis measurements (when applicable) are obtained utilizing NASCET criteria, using the distal internal carotid diameter as the denominator. Multiphase CT imaging of the brain was performed following IV bolus contrast injection. Subsequent  parametric perfusion maps were calculated using RAPID software. CONTRAST:  157m OMNIPAQUE IOHEXOL 350 MG/ML SOLN COMPARISON:  None. FINDINGS: CTA NECK FINDINGS Aortic arch: Normal variant aortic arch branching pattern with common origin of the brachiocephalic and left common carotid arteries. Mild arch atherosclerosis without arch vessel origin stenosis. Right carotid system: Patent without evidence of stenosis, dissection, or significant atherosclerosis. Left carotid system: Patent without evidence of stenosis, dissection, or significant atherosclerosis. Vertebral arteries:  Patent without evidence of stenosis, dissection, or significant atherosclerosis. Strongly dominant left vertebral artery. Skeleton: Old medial left orbital fracture. 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 Chronic appearing left mastoid air cell opacification. Mild disc degeneration at C4-5. Other neck: No evidence of cervical lymphadenopathy or mass. Upper chest: Clear lung apices. Review of the MIP images confirms the above findings CTA HEAD FINDINGS Anterior circulation: The internal carotid arteries are patent from skull base to carotid termini with atherosclerotic plaque resulting in mild left cavernous segment stenosis. A posteriorly projecting aneurysm arising from the distal right supraclinoid ICA measures 4 mm. ACAs and MCAs are patent without evidence of a significant A1 or left M1 stenosis. There is severe irregular narrowing of the distal right M1 segment over a length of 7 mm. No M2 occlusion is identified. Posterior circulation: The intracranial vertebral arteries are patent to the basilar. Patent AICA and SCA origins are seen bilaterally. The basilar artery is widely patent. There are small right and diminutive or absent left posterior communicating arteries. Both PCAs are patent without evidence of a significant proximal stenosis on the right. There is a moderate mid left P2  stenosis. No aneurysm is identified. Venous sinuses: As permitted by contrast timing, patent. Anatomic variants: None. Review of the MIP images confirms the above findings CT Brain Perfusion Findings: ASPECTS: 8 CBF (<30%) Volume: 20 mL Perfusion (Tmax>6.0s) volume: 151 mL Mismatch Volume: 131 mL Infarction Location: Core infarct in the right parietal and temporal lobes (with underlying chronic encephalomalacia in the anterior and lateral right temporal lobe as well) with penumbra throughout the majority of the right MCA territory. IMPRESSION: 1. 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 2. Right MCA infarct with extensive penumbra. 3. 4 mm right supraclinoid ICA aneurysm. 4. Widely patent cervical carotid and vertebral arteries. 5. Aortic Atherosclerosis (ICD10-I70.0). These results were called by telephone at the time of interpretation on 10/20/2020 at 2:45 p.m. to Dr. Rory Percy, who verbally acknowledged these results. Electronically Signed   By: Logan Bores M.D.   On: 10/06/2020 15:09   DG Chest Port 1 View  Result Date: 10/21/2020 CLINICAL DATA:  Respiratory failure EXAM: PORTABLE CHEST 1 VIEW COMPARISON:  None. FINDINGS: AE moderate to large left-sided pleural effusion with underlying opacity is stable. The ETT is stable in good position. The OG tube terminates below today's film. No pneumothorax. No change in the cardiomediastinal silhouette. The right mid lung opacity seen previously has resolved. Mild opacity in the right base is probably atelectasis. The right PICC line terminates in the right atrium, unchanged. No other acute abnormalities. IMPRESSION: 1. Support apparatus as above. 2. Persistent moderate to large left effusion. 3. Resolution of right midlung opacity. 4. Mild opacity in the right base is probably atelectasis. Electronically Signed   By: Dorise Bullion III M.D   On: 10/21/2020 09:37   DG Chest Port 1 View  Result Date: 10/20/2020 CLINICAL DATA:  Post thoracentesis  EXAM: PORTABLE CHEST 1 VIEW COMPARISON:  Radiograph 10/20/2020 FINDINGS: Endotracheal tube tip terminates 2.5 cm from the carina. Transesophageal tube tip and side port distal to the GE junction. Right upper extremity PICC terminates at the right atrium. Telemetry leads overlie the chest. Slight interval decrease in the size of a left pleural effusion with some lobular margins likely reflecting loculation. More coalescent retrocardiac opacity could reflect a combination of layering pleural fluid, atelectasis and/or airspace disease. Increasing coalescence of opacity seen in the right mid and lower  lung with a trace right effusion as well. No visible pneumothorax. The aorta is calcified. The remaining cardiomediastinal contours are unremarkable. No acute osseous or soft tissue abnormality. Degenerative changes are present in the imaged spine and shoulders. IMPRESSION: 1. Slight interval decrease in size of a left pleural effusion with some lobular margins which could reflect loculation. 2. Slightly increased coalescent retrocardiac and right mid and lower lung opacities, could reflect a combination of layering pleural fluid, atelectasis and/or airspace disease. 3. No pneumothorax. 4. Lines and tubes as above. 5.  Aortic Atherosclerosis (ICD10-I70.0). Electronically Signed   By: Lovena Le M.D.   On: 10/20/2020 19:50   DG CHEST PORT 1 VIEW  Result Date: 10/20/2020 CLINICAL DATA:  Respiratory distress.  Intubation. EXAM: PORTABLE CHEST 1 VIEW COMPARISON:  10/18/2020 and prior radiographs FINDINGS: An endotracheal tube is now noted. The carina is difficult to definitely identified but the endotracheal tube tip appears to lie approximately 2 cm above the carina. An NG tube is identified extending into the stomach with tip off the field of view. Moderate to large LEFT pleural effusion, LEFT LOWER lung consolidation/atelectasis, mild RIGHT basilar atelectasis, small RIGHT pleural effusion and pulmonary vascular  congestion again noted. There is no evidence of pneumothorax. A RIGHT PICC line is present with tip overlying the UPPER RIGHT atrium. IMPRESSION: 1. Endotracheal tube placement with tip approximately 2 cm above the carina. NG tube placement as described. 2. Unchanged moderate to large LEFT pleural effusion, LEFT LOWER lung consolidation/atelectasis, mild RIGHT basilar atelectasis and small RIGHT pleural effusion. Electronically Signed   By: Margarette Canada M.D.   On: 10/20/2020 04:28   DG Chest Port 1 View  Result Date: 10/18/2020 CLINICAL DATA:  Acute respiratory distress EXAM: PORTABLE CHEST 1 VIEW COMPARISON:  10/16/2020 FINDINGS: NG tube is in the stomach. Moderate to large left effusion and small to moderate right effusion. Worsening airspace disease throughout the left lung. Continued right lower lobe atelectasis or infiltrate. Heart is normal size. IMPRESSION: Bilateral pleural effusions, left larger than right. Bilateral airspace disease, worsening on the left since prior study, atelectasis versus infection. Electronically Signed   By: Rolm Baptise M.D.   On: 10/18/2020 22:25   DG Chest Port 1 View  Result Date: 10/13/2020 CLINICAL DATA:  Respiratory failure. EXAM: PORTABLE CHEST 1 VIEW COMPARISON:  09/10/2020 FINDINGS: 0515 hours. Low lung volumes. Retrocardiac left base collapse/consolidation again noted with diffuse interstitial and bilateral airspace disease. Small bilateral effusions persist. A feeding tube passes into the stomach although the distal tip position is not included on the film. Telemetry leads overlie the chest. IMPRESSION: Low volume film with persistent retrocardiac left base collapse/consolidation and diffuse bilateral airspace disease. Electronically Signed   By: Misty Stanley M.D.   On: 10/13/2020 08:12   DG Chest Port 1 View  Result Date: 10/11/2020 CLINICAL DATA:  Sepsis EXAM: PORTABLE CHEST 1 VIEW COMPARISON:  10/09/2020 FINDINGS: Two frontal views of the chest  demonstrate an enlarged cardiac silhouette. Lung volumes are diminished, with new left basilar consolidation. Small bilateral pleural effusions are identified. No pneumothorax. IMPRESSION: 1. New left basilar consolidation which could reflect atelectasis or pneumonia. 2. New small bilateral pleural effusions, left greater than right. Electronically Signed   By: Randa Ngo M.D.   On: 10/11/2020 17:46   DG Abd Portable 1V  Result Date: 10/20/2020 CLINICAL DATA:  NG tube placement. EXAM: PORTABLE ABDOMEN - 1 VIEW COMPARISON:  Prior CTs and radiographs. FINDINGS: An NG tube is identified with tip  overlying the region of the mid stomach. A percutaneous catheter overlying the RIGHT abdomen is noted. No new abnormalities identified. IMPRESSION: NG tube with tip overlying the mid stomach. Electronically Signed   By: Margarette Canada M.D.   On: 10/20/2020 04:30   DG Abd Portable 1V  Result Date: 10/16/2020 CLINICAL DATA:  Nasogastric tube placement EXAM: PORTABLE ABDOMEN - 1 VIEW COMPARISON:  One day prior FINDINGS: Removal of feeding tube with placement of nasogastric tube. This terminates at the gastric body. Percutaneous drain projects over the right upper pelvis. Gas-filled small bowel loops at up to 2.9 cm throughout the left side of the abdomen. Minimal contrast within the ascending colon. IMPRESSION: Nasogastric tube terminating at the gastric body. Gas-filled upper normal sized small bowel loops could mild adynamic ileus or less likely low-grade partial small bowel obstruction. Electronically Signed   By: Abigail Miyamoto M.D.   On: 10/16/2020 13:29   DG Abd Portable 1V  Result Date: 10/10/2020 CLINICAL DATA:  Abdominal distension EXAM: PORTABLE ABDOMEN - 1 VIEW COMPARISON:  10/07/2020 FINDINGS: Nasogastric tube is been removed. Contrast administered during a fluoroscopic examination of levin/8/21 is now seen throughout the colon and rectum which is nondilated. Normal abdominal gas pattern. No gross free  intraperitoneal gas. Osseous structures are unremarkable. Previously noted renal calculi are obscured by intraluminal contrast. IMPRESSION: Normal abdominal gas pattern. Electronically Signed   By: Fidela Salisbury MD   On: 10/10/2020 22:26   DG Abd Portable 1V  Result Date: 10/07/2020 CLINICAL DATA:  NG tube placement EXAM: PORTABLE ABDOMEN - 1 VIEW COMPARISON:  October 05, 2020 FINDINGS: The enteric tube projects over the gastric body. The tube is likely kinked at the side hole. The bowel gas pattern is nonobstructive. There is probable bilateral nephrolithiasis. IMPRESSION: Enteric tube projects over the gastric body. The tube is likely kinked at the side hole. Probable bilateral nephrolithiasis. Electronically Signed   By: Constance Holster M.D.   On: 10/07/2020 22:50   DG Swallowing Func-Speech Pathology  Result Date: 10/08/2020 Completed by Greggory Keen SLP student Supervised and reviewed by Herbie Baltimore MA CCC-SLP Objective Swallowing Evaluation: Type of Study: MBS-Modified Barium Swallow Study  Patient Details Name: Mark May MRN: [id number] Date of Birth: March 16, 1956 Today's Date: 10/08/2020 Time: SLP Start Time (ACUTE ONLY): 1250 -SLP Stop Time (ACUTE ONLY): 1307 SLP Time Calculation (min) (ACUTE ONLY): 17 min Past Medical History: Past Medical History: Diagnosis Date . Gout  . Hypertension  Past Surgical History: Past Surgical History: Procedure Laterality Date . PARTIAL GASTRECTOMY  1990  done in [place] for PUD . PROSTATE BIOPSY  ~ 2005  negative for cancer (done in [place]) . RADIOLOGY WITH ANESTHESIA N/A 10/19/2020  Procedure: IR WITH ANESTHESIA;  Surgeon: Radiologist, Medication, MD;  Location: [facility];  Service: Radiology;  Laterality: N/A; HPI: Mr. Mark May is a 64 y.o. male with history of hypertension, BPH, sciatica, stomach ulcer status post surgery in the past presented to ER at [facility] for acute onset left-sided weakness, left facial droop and left hemianopia. CT head and neck  showed right M1 high-grade stenosis versus near occlusive thrombosis. CT perfusion showed large penumbra. Patient transferred to [facility] for thrombectomy. Pt intubated 11/5-11/7 with post-extubation respiratory distress.  No data recorded Assessment / Plan / Recommendation CHL IP CLINICAL IMPRESSIONS 10/08/2020 Clinical Impression Pt was seen for MBS with son present for translating. MBS revealed a pharyngeal dysphagia characterized by silent aspiration, pharyngeal residue, and multiple swallows. Given pt's  presentation during MBS, suspect he has pharyngeal weakness that is contributing to his dysphagia. Silent aspiration of thin liquid was observed before the pt had achieved full laryngeal closure. Pt's volitional cough was observed to be weak and unsuccessful. No other instances of aspiration were observed. Moderate amounts of pharyngeal residue were observed in the valleulae and pyriforms across POs. SLP educated pt in use of an effortful swallow, which was successful in reducing the amount of pharyngeal residue. Pt was also observed to utilize multiple swallows across POs. Recommend dys 3 diet with nectar thick liquid and use of effortful swallow. SLP will continue to f/u for diet toleration. SLP Visit Diagnosis Dysphagia, pharyngeal phase (R13.13) Attention and concentration deficit following -- Frontal lobe and executive function deficit following -- Impact on safety and function Moderate aspiration risk   CHL IP TREATMENT RECOMMENDATION 10/08/2020 Treatment Recommendations Therapy as outlined in treatment plan below   Prognosis 10/08/2020 Prognosis for Safe Diet Advancement Good Barriers to Reach Goals -- Barriers/Prognosis Comment -- CHL IP DIET RECOMMENDATION 10/08/2020 SLP Diet Recommendations Dysphagia 3 (Mech soft) solids;Nectar thick liquid Liquid Administration via Cup;Straw Medication Administration Crushed with puree Compensations Effortful swallow Postural Changes Seated upright at 90 degrees   CHL  IP OTHER RECOMMENDATIONS 10/08/2020 Recommended Consults -- Oral Care Recommendations Oral care BID Other Recommendations Order thickener from pharmacy   No flowsheet data found.  CHL IP FREQUENCY AND DURATION 10/08/2020 Speech Therapy Frequency (ACUTE ONLY) min 2x/week Treatment Duration 2 weeks      CHL IP ORAL PHASE 10/08/2020 Oral Phase WFL Oral - Pudding Teaspoon -- Oral - Pudding Cup -- Oral - Honey Teaspoon -- Oral - Honey Cup -- Oral - Nectar Teaspoon -- Oral - Nectar Cup -- Oral - Nectar Straw -- Oral - Thin Teaspoon -- Oral - Thin Cup -- Oral - Thin Straw -- Oral - Puree -- Oral - Mech Soft -- Oral - Regular -- Oral - Multi-Consistency -- Oral - Pill -- Oral Phase - Comment --  CHL IP PHARYNGEAL PHASE 10/08/2020 Pharyngeal Phase Impaired Pharyngeal- Pudding Teaspoon -- Pharyngeal -- Pharyngeal- Pudding Cup -- Pharyngeal -- Pharyngeal- Honey Teaspoon -- Pharyngeal -- Pharyngeal- Honey Cup -- Pharyngeal -- Pharyngeal- Nectar Teaspoon -- Pharyngeal -- Pharyngeal- Nectar Cup -- Pharyngeal -- Pharyngeal- Nectar Straw Pharyngeal residue - valleculae;Pharyngeal residue - pyriform;Compensatory strategies attempted (with notebox) Pharyngeal -- Pharyngeal- Thin Teaspoon -- Pharyngeal -- Pharyngeal- Thin Cup Compensatory strategies attempted (with notebox);Pharyngeal residue - valleculae;Pharyngeal residue - pyriform;Penetration/Aspiration before swallow;Penetration/Aspiration during swallow;Moderate aspiration Pharyngeal Material enters airway, passes BELOW cords without attempt by patient to eject out (silent aspiration) Pharyngeal- Thin Straw Penetration/Aspiration during swallow;Trace aspiration;Pharyngeal residue - valleculae;Pharyngeal residue - pyriform;Compensatory strategies attempted (with notebox) Pharyngeal Material enters airway, remains ABOVE vocal cords and not ejected out Pharyngeal- Puree Pharyngeal residue - valleculae;Pharyngeal residue - pyriform Pharyngeal -- Pharyngeal- Mechanical Soft -- Pharyngeal  -- Pharyngeal- Regular Pharyngeal residue - valleculae;Pharyngeal residue - pyriform Pharyngeal -- Pharyngeal- Multi-consistency -- Pharyngeal -- Pharyngeal- Pill -- Pharyngeal -- Pharyngeal Comment --  No flowsheet data found. DeBlois, Katherene Ponto 10/08/2020, 2:16 PM              ECHOCARDIOGRAM COMPLETE  Result Date: 10/06/2020    ECHOCARDIOGRAM REPORT   Patient Name:   DASHAUN ONSTOTT Date of Exam: 10/06/2020 Medical Rec #:  [id number]   Height:       67.0 in Accession #:    [id number]  Weight:       125.0 lb Date of Birth:  01/17/1956    BSA:          1.656 m Patient Age:    58 years    BP:           109/63 mmHg Patient Gender: M           HR:           71 bpm. Exam Location:  Inpatient Procedure: 2D Echo Indications:    stroke 434.91  History:        Patient has no prior history of Echocardiogram examinations.                 Risk Factors:Hypertension.  Sonographer:    Jannett Celestine RDCS (AE) Referring Phys: [id number] Rosalin Hawking  Sonographer Comments: Echo performed with patient supine and on artificial respirator. Image acquisition challenging due to respiratory motion. very restricted mobility. difficulty remaining still at times. IMPRESSIONS  1. Left ventricular ejection fraction, by estimation, is 70 to 75%. The left ventricle has hyperdynamic function. The left ventricle has no regional wall motion abnormalities. Left ventricular diastolic parameters are consistent with Grade II diastolic dysfunction (pseudonormalization). Elevated left atrial pressure.  2. Right ventricular systolic function is normal. The right ventricular size is normal.  3. The mitral valve is normal in structure. Mild mitral valve regurgitation. No evidence of mitral stenosis.  4. The aortic valve is normal in structure. Aortic valve regurgitation is not visualized. No aortic stenosis is present.  5. The inferior vena cava is dilated in size with <50% respiratory variability, suggesting right atrial pressure of 15 mmHg.  Conclusion(s)/Recommendation(s): No intracardiac source of embolism detected on this transthoracic study. A transesophageal echocardiogram is recommended to exclude cardiac source of embolism if clinically indicated. FINDINGS  Left Ventricle: Left ventricular ejection fraction, by estimation, is 70 to 75%. The left ventricle has hyperdynamic function. The left ventricle has no regional wall motion abnormalities. The left ventricular internal cavity size was normal in size. There is no left ventricular hypertrophy. Left ventricular diastolic parameters are consistent with Grade II diastolic dysfunction (pseudonormalization). Elevated left atrial pressure. Right Ventricle: The right ventricular size is normal. No increase in right ventricular wall thickness. Right ventricular systolic function is normal. Left Atrium: Left atrial size was normal in size. Right Atrium: Right atrial size was normal in size. Pericardium: There is no evidence of pericardial effusion. Mitral Valve: The mitral valve is normal in structure. Mild mitral valve regurgitation. No evidence of mitral valve stenosis. Tricuspid Valve: The tricuspid valve is normal in structure. Tricuspid valve regurgitation is trivial. No evidence of tricuspid stenosis. Aortic Valve: The aortic valve is normal in structure. Aortic valve regurgitation is not visualized. No aortic stenosis is present. Pulmonic Valve: The pulmonic valve was normal in structure. Pulmonic valve regurgitation is not visualized. No evidence of pulmonic stenosis. Aorta: The aortic root is normal in size and structure. Venous: The inferior vena cava is dilated in size with less than 50% respiratory variability, suggesting right atrial pressure of 15 mmHg. IAS/Shunts: No atrial level shunt detected by color flow Doppler.  LEFT VENTRICLE PLAX 2D LVIDd:         3.60 cm  Diastology LVIDs:         1.90 cm  LV e' medial:    7.07 cm/s LV PW:         1.00 cm  LV E/e' medial:  15.8 LV IVS:        0.90  cm  LV e' lateral:   9.46  cm/s LVOT diam:     2.40 cm  LV E/e' lateral: 11.8 LV SV:         155 LV SV Index:   93 LVOT Area:     4.52 cm  LEFT ATRIUM             Index       RIGHT ATRIUM           Index LA diam:        2.90 cm 1.75 cm/m  RA Area:     10.30 cm LA Vol (A2C):   42.3 ml 25.54 ml/m RA Volume:   18.60 ml  11.23 ml/m LA Vol (A4C):   33.2 ml 20.05 ml/m LA Biplane Vol: 39.7 ml 23.97 ml/m  AORTIC VALVE LVOT Vmax:   153.00 cm/s LVOT Vmean:  115.000 cm/s LVOT VTI:    0.342 m  AORTA Ao Root diam: 2.80 cm MITRAL VALVE MV Area (PHT): 2.99 cm     SHUNTS MV Decel Time: 254 msec     Systemic VTI:  0.34 m MV E velocity: 112.00 cm/s  Systemic Diam: 2.40 cm MV A velocity: 94.70 cm/s MV E/A ratio:  1.18 Ena Dawley MD Electronically signed by Ena Dawley MD Signature Date/Time: 10/06/2020/3:01:36 PM    Final    IR PERCUTANEOUS ART THROMBECTOMY/INFUSION INTRACRANIAL INC DIAG ANGIO  Result Date: 10/17/2020 INDICATION: New onset of left-sided weakness, associated with facial weakness, and right gaze preference. CT of the brain demonstrating pre occlusive stenosis of the mid right M1 segment due to thrombus +/-intracranial arteriosclerosis. EXAM: 1. EMERGENT LARGE VESSEL OCCLUSION THROMBOLYSIS (anterior CIRCULATION) COMPARISON:  CT angiogram of head and neck of October 05, 2020. MEDICATIONS: The antibiotic was administered within 1 hour of the procedure ANESTHESIA/SEDATION: General anesthesia CONTRAST:  Isovue 300 approximately 125 mL. FLUOROSCOPY TIME:  Fluoroscopy Time: 39 minutes 0 seconds (1592 mGy). COMPLICATIONS: None immediate. TECHNIQUE: Following a full explanation of the procedure along with the potential associated complications, an informed witnessed consent was obtained. The risks of intracranial hemorrhage of 10%, worsening neurological deficit, ventilator dependency, death and inability to revascularize were all reviewed in detail with the patient's son. The patient was then put under  general anesthesia by the Department of Anesthesiology at [facility]. The right groin was prepped and draped in the usual sterile fashion. Thereafter using modified Seldinger technique, transfemoral access into the right common femoral artery was obtained without difficulty. Over a 0.035 inch guidewire an 8 French 25 cm Pinnacle sheath was inserted. Through this, and also over a 0.035 inch guidewire a 5 [place] JB 1 catheter was advanced to the aortic arch region and selectively positioned in the right common carotid artery. FINDINGS: The right common carotid arteriogram demonstrates the right external carotid artery and its major branches to be widely patent. The right internal carotid artery at the bulb to the cranial skull base is also widely patent. The petrous, the cavernous and the supraclinoid segments are widely patent. The right anterior cerebral artery opacifies into the capillary and venous phases. Prompt cross filling via the anterior communicating artery of the left anterior cerebral A2 segment is also seen. The right middle cerebral artery demonstrates a severe tapered near occlusive stenosis of the mid right A1 segment. More distally delayed opacification of the right middle cerebral trifurcation branches is seen into the capillary and venous phases. There is delayed hemodynamic flow through the right middle cerebral artery distribution. PROCEDURE: The diagnostic JB 1 catheter in the right common  carotid artery was exchanged over a 0.035 inch 300 cm Rosen exchange guidewire for an 087 balloon guide catheter which had been prepped with 50% contrast and 50% heparinized saline infusion. The guidewire was removed. Good aspiration obtained from the hub of the balloon guide catheter. A gentle control arteriogram performed through this demonstrated no change in the extracranial or intracranial circulations. Over a 0.035 inch Roadrunner guidewire, a 6 French 132 cm Catalyst guide catheter was then  advanced and positioned in the horizontal petrous segment of the right internal carotid artery. The guidewire was removed. Good aspiration was obtained from the hub of the 6 [place] guide sheath. A control arteriogram was then performed intracranially. 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 Over a 0.014 inch standard Synchro micro guidewire with a J-tip configuration, an 021 Trevo ProVue microcatheter was advanced to the supraclinoid right ICA. Using a torque device the guidewire was then advanced without difficulty through the high-grade stenosis into the inferior division of the right middle cerebral artery followed by the microcatheter. The guidewire was removed. Good aspiration obtained from the hub of the microcatheter. A gentle control arteriogram performed through the microcatheter demonstrated safe position of the tip of the microcatheter. This was then connected to continuous heparinized saline infusion. A Tiger 21 retrieval device was then prepped and purged with heparinized saline infusion then advanced to the distal end of the microcatheter. The O ring on the delivery microcatheter was loosened. The Tiger 21 was then deployed by retrieving the delivery microcatheter slowly first delivering the distal micro guidewire and then the proximal retrieval device. The proximal end of the retrieval device was positioned at the proximal aspect of the occlusion. Proximal flow arrest was initiated in the right internal carotid artery. Thereafter, the diameter of the retrieval device was then increased and decreased for cycle. The microcatheter was advanced to pinch the proximal marker on the retrieval device. The aspiration catheter was then brought just proximal to the retrieval device. Constant aspiration was performed at the hub of the 6 [place] Catalyst guide catheter  with a Penumbra aspiration pump for approximately a minute and half followed by the retrieval device, the microcatheter, and the 6 [place] Catalyst guide catheter which were locked. Pieces of clot were seen entangled in the retrieval device. Flow arrest was reversed. Control arteriogram performed through the balloon guide in the right internal carotid artery demonstrated complete revascularization of the right middle cerebral artery distribution achieving a TICI 3 revascularization. The right middle cerebral artery was now widely patent with a patency of 75% at the site of previous occlusion. Also seen was related plaque against the wall of the native vessel. A control arteriogram performed approximately 3 minutes later demonstrated progressive decrease in the caliber with slow flow in the right middle cerebral artery distribution. This progressed to complete occlusion. This prompted the placement of a rescue stent. Measurements were performed of the right middle cerebral artery proximally and distally. An 021 microcatheter was then advanced inside of a Catalyst 6 [place] guide catheter over a 0.014 inch standard Synchro micro guidewire. The micro guidewire was then advanced without difficulty into the distal M2 M3 region of the inferior division followed by the microcatheter. The guidewire was removed. Good aspiration obtained from microcatheter. This was then connected to continuous heparinized saline infusion. A 4 mm x 24 mm Atlas Neuroform stent was then advanced to  the distal end of the microcatheter. The proximal and the distal landing zone markers were aligned in the appropriate position relative to the site of the severe stenosis. This was then deployed in the usual manner without any difficulty. A control arteriogram performed following the delivery of the stent demonstrated excellent apposition with significantly improved caliber and flow through the stented segment. There continued to be a mild to modest  waist which remained stable over the next 20 minutes as per diagnostic arteriograms performed through the 6 [place] Catalyst guide catheter in the right internal carotid artery. A TICI 3 revascularization had been re-established. A 30 minute control arteriogram performed through the 6 [place] Catalyst guide catheter in the right internal carotid artery continued to demonstrate excellent flow through the stented segment of the right middle cerebral artery and the right anterior cerebral artery distributions. This was then retrieved and removed. A final control arteriogram performed through the balloon guide catheter in the right common carotid artery demonstrated wide patency of the right internal carotid artery proximally and distally. The balloon guide was removed. A short 8 French sheath was then positioned in the right common femoral puncture site and connected to continuous heparinized saline infusion. Distal pulses remained Dopplerable in the dorsalis pedis, and the posterior tibial regions bilaterally. A CT of the brain performed on the table demonstrated no evidence of hemorrhage, mass effect or midline shift. Patient was left intubated and transferred to the ICU for post thrombectomy management. Patient was loaded with 81 mg of aspirin, and 180 mg of Brilinta via orogastric tube prior to the placement of the stent. Also the patient was given a loading dose of cangrelor followed by a 4 hour infusion with a CT to follow at the end. IMPRESSION: Status post endovascular complete revascularization of the near complete occlusion of the right middle cerebral M1 segment with 1 pass with the Tiger 21 retrieval device and aspiration achieving a TICI 3 revascularization, followed by placement of a rescue stent for reocclusion again achieving a TICI 3 revascularization. PLAN: Follow-up in the clinic 4 to 6 weeks post discharge. Electronically Signed   By: Luanne Bras M.D.   On: 10/08/2020 11:19   CT IMAGE GUIDED  DRAINAGE BY PERCUTANEOUS CATHETER  Result Date: 10/14/2020 CLINICAL DATA:  Large retroperitoneal abscess centered primarily inferior to the right kidney. EXAM: CT GUIDED CATHETER DRAINAGE OF RIGHT RETROPERITONEAL ABSCESS ANESTHESIA/SEDATION: 2.0 mg IV Versed 100 mcg IV Fentanyl Total Moderate Sedation Time:  12 minutes The patient's level of consciousness and physiologic status were continuously monitored during the procedure by Radiology nursing. PROCEDURE: The procedure, risks, benefits, and alternatives were explained to the patient. Questions regarding the procedure were encouraged and answered. The patient understands and consents to the procedure. A foreign language interpreter was utilized for consent. A time out was performed prior to initiating the procedure. CT was performed through the abdomen in a prone position. The right translumbar region was prepped with chlorhexidine in a sterile fashion, and a sterile drape was applied covering the operative field. A sterile gown and sterile gloves were used for the procedure. Local anesthesia was provided with 1% Lidocaine. Under CT guidance, an 18 gauge trocar needle was advanced into the right retroperitoneal space. After return of fluid, a guidewire was advanced. The tract was dilated and a 12 French percutaneous drainage catheter placed. Additional fluid sample was aspirated from the drain and sent for culture analysis. Catheter position was confirmed by CT. The catheter was attached to suction bulb  drainage. It was secured at the skin with a Prolene retention suture and StatLock device. COMPLICATIONS: None FINDINGS: The retroperitoneal collection yielded reddish brown fluid. After drain placement there is copious return of fluid. IMPRESSION: CT-guided percutaneous catheter drainage of right retroperitoneal abscess yielding reddish brown fluid. A fluid sample was sent for culture analysis. A 12 French drainage catheter was placed and attached to suction  bulb drainage. Electronically Signed   By: Aletta Edouard M.D.   On: 10/14/2020 12:47   VAS [place] UPPER EXTREMITY VENOUS DUPLEX  Result Date: 10/08/2020 UPPER VENOUS STUDY  Indications: Swelling Risk Factors: None identified. Comparison Study: No prior studies. Performing Technologist: Oliver Hum RVT  Examination Guidelines: A complete evaluation includes B-mode imaging, spectral Doppler, color Doppler, and power Doppler as needed of all accessible portions of each vessel. Bilateral testing is considered an integral part of a complete examination. Limited examinations for reoccurring indications may be performed as noted.  Right Findings: +----------+------------+---------+-----------+----------+-------+ RIGHT     CompressiblePhasicitySpontaneousPropertiesSummary +----------+------------+---------+-----------+----------+-------+ Subclavian    Full       Yes       Yes                      +----------+------------+---------+-----------+----------+-------+  Left Findings: +----------+------------+---------+-----------+----------+-----------------+ LEFT      CompressiblePhasicitySpontaneousProperties     Summary      +----------+------------+---------+-----------+----------+-----------------+ IJV           Full       Yes       Yes                                +----------+------------+---------+-----------+----------+-----------------+ Subclavian    Full       Yes       Yes                                +----------+------------+---------+-----------+----------+-----------------+ Axillary      Full       Yes       Yes                                +----------+------------+---------+-----------+----------+-----------------+ Brachial      Full       Yes       Yes                                +----------+------------+---------+-----------+----------+-----------------+ Radial        Full                                                     +----------+------------+---------+-----------+----------+-----------------+ Ulnar         Full                                                    +----------+------------+---------+-----------+----------+-----------------+ Cephalic      None  Age Indeterminate +----------+------------+---------+-----------+----------+-----------------+ Basilic       Full                                                    +----------+------------+---------+-----------+----------+-----------------+  Summary:  Right: No evidence of thrombosis in the subclavian.  Left: No evidence of deep vein thrombosis in the upper extremity. Findings consistent with age indeterminate superficial vein thrombosis involving the left cephalic vein.  *See table(s) above for measurements and observations.  Diagnosing physician: Monica Martinez MD Electronically signed by Monica Martinez MD on 10/08/2020 at 3:28:16 PM.    Final    [place] EKG SITE RITE  Result Date: 10/17/2020 If Site Rite image not attached, placement could not be confirmed due to current cardiac rhythm.  [place] EKG SITE RITE  Result Date: 10/13/2020 If Site Rite image not attached, placement could not be confirmed due to current cardiac rhythm.      I have independently reviewed the above radiology studies  and reviewed the findings with the patient.   Recent Lab Findings: Lab Results  Component Value Date   WBC 20.8 (H) 10/21/2020   HGB 9.9 (L) 10/21/2020   HCT 30.1 (L) 10/21/2020   PLT 163 10/21/2020   GLUCOSE 209 (H) 10/21/2020   CHOL 173 10/06/2020   TRIG 379 (H) 10/20/2020   HDL 33 (L) 10/06/2020   LDLCALC 85 10/06/2020   ALT 80 (H) 10/21/2020   AST 144 (H) 10/21/2020   NA 144 10/21/2020   K 5.6 (H) 10/21/2020   CL 118 (H) 10/21/2020   CREATININE 2.62 (H) 10/21/2020   BUN 66 (H) 10/21/2020   CO2 17 (L) 10/21/2020   INR 1.7 (H) 10/21/2020   HGBA1C 5.8 (H) 10/06/2020      Assessment / Plan:    64 year old gentleman for left-sided hemothorax.  Currently in respiratory failure requiring ventilatory support.  I had a long conversation with the patient's children and they have agreed to proceed with a left video-assisted thoracoscopy and decortication.  He is already intubated so much of his risk is mitigated.  We will not exchange the endotracheal tube for double lumen.     I  spent 40 minutes with  the patient face to face and greater then 50% of the time was spent in counseling and coordination of care.    Lajuana Matte 10/21/2020 8:20 PM

## 2020-10-21 NOTE — Progress Notes (Signed)
Hx of PUD s/p partial gastrectomy 1990 in [place]. General surgery following for a contained bowel perforation with intra-abdominal abscess.  Subjective/Chief Complaint: - General surgery engaged for worsening hemodynamics, 4 gram drop in hemoglobin, and a non-contrasted CT with demonstrated a large collection of blood products in the superior left retroperitoneal vs pleural space. The patient underwent a thoracentesis yesterday for a pleural effusion suspected to be infectious in etiology. The patient received 2 units PRBCs overnight with an inappropriately low response in the Hgb (6 to 7).   Objective: Vital signs in last 24 hours: Temp:  [97.4 F (36.3 C)-103 F (39.4 C)] 98.8 F (37.1 C) (11/21 0815) Pulse Rate:  [81-123] 91 (11/21 0753) Resp:  [14-26] 18 (11/21 0753) BP: (68-145)/(47-73) 86/57 (11/21 0753) SpO2:  [93 %-100 %] 96 % (11/21 0753) FiO2 (%):  [30 %-40 %] 30 % (11/21 0753) Weight:  [56.1 kg] 56.1 kg (11/21 0424) Last BM Date: 10/20/20  Intake/Output from previous day: 11/20 0701 - 11/21 0700 In: 3389.5 [I.V.:2559.7; Blood:605; IV Piggyback:224.9] Out: 1200 [Urine:425; Drains:125] Intake/Output this shift: Total I/O In: 107.5 [I.V.:95; IV Piggyback:12.4] Out: -   PE: Constitutional: Intubated and sedated, critically ill Eyes: closed Lungs: mechanically ventilated CV: regular rate and rhythm, no murmurs, no peripheral edema GI: Soft, no masses or hepatosplenomegaly, unable to assess tenderness due to mental status, JP purulent Skin: No rashes, palpation reveals normal turgor Psychiatric: appropriate judgment and insight, oriented to person, place, and time  Lab Results:  Recent Labs    10/21/20 0306 10/21/20 0839  WBC  --  20.8*  HGB 6.0* 7.1*  HCT 18.7* 22.3*  PLT  --  163   BMET Recent Labs    10/19/20 0032 10/19/20 0032 10/20/20 0359 10/20/20 0632  NA 148*   < > 149* 146*  K 3.6   < > 3.9 4.0  CL 121*  --   --  119*  CO2 20*  --   --  19*   GLUCOSE 130*  --   --  341*  BUN 26*  --   --  35*  CREATININE 1.03  --   --  1.20  CALCIUM 7.4*  --   --  7.5*   < > = values in this interval not displayed.   PT/INR Recent Labs    10/21/20 0139  LABPROT 19.6*  INR 1.7*   ABG Recent Labs    10/18/20 2208 10/20/20 0359  PHART 7.532* 7.302*  HCO3 19.0* 21.1    Studies/Results: CT ABDOMEN PELVIS WO CONTRAST  Result Date: 10/21/2020 CLINICAL DATA:  Abdominal pain and abdominal distension. EXAM: CT ABDOMEN AND PELVIS WITHOUT CONTRAST TECHNIQUE: Multidetector CT imaging of the abdomen and pelvis was performed following the standard protocol without IV contrast. COMPARISON:  October 16, 2020 FINDINGS: Lower chest: Moderate severity areas of consolidation are seen within the bilateral lung bases. There is a large left pleural effusion with suspected loculated components. This is increased in size when compared to the prior study. A small to moderate sized right pleural effusion is also noted. Hepatobiliary: No focal liver abnormality is seen. Numerous subcentimeter gallstones are seen within the lumen of a normal appearing gallbladder. There is no evidence of biliary dilatation. Pancreas: Unremarkable. No pancreatic ductal dilatation or surrounding inflammatory changes. Spleen: The spleen is displaced within the anteromedial aspect of the left upper quadrant the. A 15.5 cm x 11.3 cm x 16.9 cm area containing a mixture of hemorrhagic and non hemorrhagic fluid is seen within  the left upper quadrant. This appears to be retroperitoneal in location and represents a new finding when compared to the prior exam. Adrenals/Urinary Tract: Adrenal glands are unremarkable. Kidneys are normal in size. Multiple stable bilateral renal cysts of various sizes are seen. Multiple 2 mm, 3 mm and 4 mm nonobstructing renal stones are seen throughout the right kidney. A mild amount of air is again seen within the lumen of the urinary bladder. Stomach/Bowel: A  nasogastric tube is seen with its distal tip noted within the body of the stomach. The appendix is not clearly identified. No evidence of bowel dilatation. A 2.1 cm x 1.3 cm area of oral contrast is seen adjacent to the posteromedial aspect of the cecum (axial CT image 69, CT series number 3). This is present on the prior study. Vascular/Lymphatic: There is moderate severity calcification of the abdominal aorta and bilateral common iliac arteries, without evidence of aneurysmal dilatation. No enlarged abdominal or pelvic lymph nodes. Reproductive: Prostate is unremarkable. Other: A percutaneous drainage catheter is again seen entering via the right flank. Its distal tip is noted along the posterior aspect of the right lower quadrant and is unchanged in position when compared to the prior study. Numerous foci of moderate severity contained free air are again seen within this region. Persistent surrounding mesenteric inflammatory fat stranding and mesenteric fluid is also noted. A small amount of posterior pelvic free fluid is seen. This is stable in appearance when compared to the prior study. Musculoskeletal: A moderate to marked amount of para muscular subcutaneous inflammatory fat stranding is seen along the lateral aspects of the left abdominal and pelvic walls. No acute or significant osseous findings. IMPRESSION: 1. Very large area of intra-abdominal fluid and blood within the left upper quadrant, with an appearance worrisome for active bleeding. 2. Large left pleural effusion with a small to moderate sized right pleural effusion. 3. Moderate severity bibasilar consolidation which may represent atelectasis and/or pneumonia. 4. Cholelithiasis. 5. Multiple stable bilateral renal cysts of various sizes. 6. Stable position of the right-sided percutaneous drainage catheter with a moderate amount of surrounding contained free air, fluid and inflammatory fat stranding. Fistulous communication with the adjacent portion  of large bowel cannot be excluded. 7. Moderate to marked amount of para muscular subcutaneous inflammatory fat stranding along the lateral aspects of the left abdominal and pelvic walls. 8. Moderate to marked amount of para muscular. 9. Aortic atherosclerosis. Aortic Atherosclerosis (ICD10-I70.0). Electronically Signed   By: Aram Candela M.D.   On: 10/21/2020 01:53   DG Chest Port 1 View  Result Date: 10/21/2020 CLINICAL DATA:  Respiratory failure EXAM: PORTABLE CHEST 1 VIEW COMPARISON:  None. FINDINGS: AE moderate to large left-sided pleural effusion with underlying opacity is stable. The ETT is stable in good position. The OG tube terminates below today's film. No pneumothorax. No change in the cardiomediastinal silhouette. The right mid lung opacity seen previously has resolved. Mild opacity in the right base is probably atelectasis. The right PICC line terminates in the right atrium, unchanged. No other acute abnormalities. IMPRESSION: 1. Support apparatus as above. 2. Persistent moderate to large left effusion. 3. Resolution of right midlung opacity. 4. Mild opacity in the right base is probably atelectasis. Electronically Signed   By: Gerome Sam III M.D   On: 10/21/2020 09:37   DG Chest Port 1 View  Result Date: 10/20/2020 CLINICAL DATA:  Post thoracentesis EXAM: PORTABLE CHEST 1 VIEW COMPARISON:  Radiograph 10/20/2020 FINDINGS: Endotracheal tube tip terminates 2.5 cm from  the carina. Transesophageal tube tip and side port distal to the GE junction. Right upper extremity PICC terminates at the right atrium. Telemetry leads overlie the chest. Slight interval decrease in the size of a left pleural effusion with some lobular margins likely reflecting loculation. More coalescent retrocardiac opacity could reflect a combination of layering pleural fluid, atelectasis and/or airspace disease. Increasing coalescence of opacity seen in the right mid and lower lung with a trace right effusion as  well. No visible pneumothorax. The aorta is calcified. The remaining cardiomediastinal contours are unremarkable. No acute osseous or soft tissue abnormality. Degenerative changes are present in the imaged spine and shoulders. IMPRESSION: 1. Slight interval decrease in size of a left pleural effusion with some lobular margins which could reflect loculation. 2. Slightly increased coalescent retrocardiac and right mid and lower lung opacities, could reflect a combination of layering pleural fluid, atelectasis and/or airspace disease. 3. No pneumothorax. 4. Lines and tubes as above. 5.  Aortic Atherosclerosis (ICD10-I70.0). Electronically Signed   By: Kreg Shropshire M.D.   On: 10/20/2020 19:50   DG CHEST PORT 1 VIEW  Result Date: 10/20/2020 CLINICAL DATA:  Respiratory distress.  Intubation. EXAM: PORTABLE CHEST 1 VIEW COMPARISON:  10/18/2020 and prior radiographs FINDINGS: An endotracheal tube is now noted. The carina is difficult to definitely identified but the endotracheal tube tip appears to lie approximately 2 cm above the carina. An NG tube is identified extending into the stomach with tip off the field of view. Moderate to large LEFT pleural effusion, LEFT LOWER lung consolidation/atelectasis, mild RIGHT basilar atelectasis, small RIGHT pleural effusion and pulmonary vascular congestion again noted. There is no evidence of pneumothorax. A RIGHT PICC line is present with tip overlying the UPPER RIGHT atrium. IMPRESSION: 1. Endotracheal tube placement with tip approximately 2 cm above the carina. NG tube placement as described. 2. Unchanged moderate to large LEFT pleural effusion, LEFT LOWER lung consolidation/atelectasis, mild RIGHT basilar atelectasis and small RIGHT pleural effusion. Electronically Signed   By: Harmon Pier M.D.   On: 10/20/2020 04:28   DG Abd Portable 1V  Result Date: 10/20/2020 CLINICAL DATA:  NG tube placement. EXAM: PORTABLE ABDOMEN - 1 VIEW COMPARISON:  Prior CTs and radiographs.  FINDINGS: An NG tube is identified with tip overlying the region of the mid stomach. A percutaneous catheter overlying the RIGHT abdomen is noted. No new abnormalities identified. IMPRESSION: NG tube with tip overlying the mid stomach. Electronically Signed   By: Harmon Pier M.D.   On: 10/20/2020 04:30    Anti-infectives: Anti-infectives (From admission, onward)   Start     Dose/Rate Route Frequency Ordered Stop   10/19/20 1400  piperacillin-tazobactam (ZOSYN) IVPB 3.375 g        3.375 g 12.5 mL/hr over 240 Minutes Intravenous Every 8 hours 10/19/20 1150     10/19/20 0200  vancomycin (VANCOREADY) IVPB 500 mg/100 mL  Status:  Discontinued        500 mg 100 mL/hr over 60 Minutes Intravenous Every 12 hours 10/19/20 0025 10/19/20 0951   10/19/20 0200  metroNIDAZOLE (FLAGYL) IVPB 500 mg  Status:  Discontinued        500 mg 100 mL/hr over 60 Minutes Intravenous Every 8 hours 10/19/20 0045 10/19/20 1150   10/19/20 0115  ceFEPIme (MAXIPIME) 2 g in sodium chloride 0.9 % 100 mL IVPB  Status:  Discontinued        2 g 200 mL/hr over 30 Minutes Intravenous Every 12 hours 10/19/20 0025 10/19/20 1150  10/17/20 1400  Ampicillin-Sulbactam (UNASYN) 3 g in sodium chloride 0.9 % 100 mL IVPB  Status:  Discontinued        3 g 200 mL/hr over 30 Minutes Intravenous Every 6 hours 10/17/20 1207 10/18/20 2325   10/17/20 1400  fluconazole (DIFLUCAN) IVPB 400 mg        400 mg 100 mL/hr over 120 Minutes Intravenous Every 24 hours 10/17/20 1207     10/15/20 1630  vancomycin (VANCOREADY) IVPB 500 mg/100 mL  Status:  Discontinued        500 mg 100 mL/hr over 60 Minutes Intravenous Every 12 hours 10/15/20 0940 10/17/20 1205   10/14/20 0400  vancomycin (VANCOREADY) IVPB 500 mg/100 mL  Status:  Discontinued        500 mg 100 mL/hr over 60 Minutes Intravenous Every 24 hours 10/13/20 0233 10/15/20 0940   10/13/20 0330  vancomycin (VANCOREADY) IVPB 750 mg/150 mL        750 mg 150 mL/hr over 60 Minutes Intravenous  Once  10/13/20 0233 10/13/20 0439   10/12/20 1800  ceFEPIme (MAXIPIME) 2 g in sodium chloride 0.9 % 100 mL IVPB  Status:  Discontinued        2 g 200 mL/hr over 30 Minutes Intravenous Every 24 hours 10/11/20 1718 10/11/20 1742   10/12/20 1800  vancomycin (VANCOREADY) IVPB 750 mg/150 mL  Status:  Discontinued        750 mg 150 mL/hr over 60 Minutes Intravenous Every 24 hours 10/11/20 1724 10/11/20 1758   10/12/20 0200  metroNIDAZOLE (FLAGYL) tablet 500 mg  Status:  Discontinued        500 mg Oral Every 8 hours 10/11/20 1715 10/11/20 1758   10/12/20 0030  piperacillin-tazobactam (ZOSYN) IVPB 3.375 g  Status:  Discontinued        3.375 g 12.5 mL/hr over 240 Minutes Intravenous Every 8 hours 10/11/20 1805 10/17/20 1205   10/11/20 1830  piperacillin-tazobactam (ZOSYN) IVPB 3.375 g        3.375 g 100 mL/hr over 30 Minutes Intravenous  Once 10/11/20 1805 10/11/20 1839   10/11/20 1715  ceFEPIme (MAXIPIME) 2 g in sodium chloride 0.9 % 100 mL IVPB  Status:  Discontinued        2 g 200 mL/hr over 30 Minutes Intravenous STAT 10/11/20 1710 10/11/20 1742   10/11/20 1715  metroNIDAZOLE (FLAGYL) IVPB 500 mg  Status:  Discontinued        500 mg 100 mL/hr over 60 Minutes Intravenous STAT 10/11/20 1710 10/11/20 1807   10/11/20 1715  vancomycin (VANCOCIN) IVPB 1000 mg/200 mL premix  Status:  Discontinued        1,000 mg 200 mL/hr over 60 Minutes Intravenous STAT 10/11/20 1710 10/11/20 1804   10/28/2020 1541  ceFAZolin (ANCEF) 2-4 GM/100ML-% IVPB       Note to Pharmacy: [facility]   : cabinet override      10/20/2020 1541 10/06/20 0344      Assessment/Plan: RLL PNA Metabolic encephalopathy  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 Will plan UGI vs CT with PO contrast likely next week for evaluate for persistent leak. At that time if patient appears to have leak will need to consider continuing TPN and NPO vs palliative consult vs surgical intervention understanding that patient is a high risk surgical candidate given recent stroke  New retroperitoneal hematoma vs hemothorax - STAT CTA Chest/Abdomen/Pelvis to assess for likely bleeding intercostal artery - If active extravasation is noted, consult IR for urgent emobolization - If hematoma is in the retroperitoneal space, no intervention needed for evacuation - If patient has hemothorax, recommend thoracic surgery consultation for large bore chest tube  - Recommend holding anticoagulation at this time  FEN: NPO, IVF, PICC and TPN  VTE: SCDs, ok to have SQ heparin or lovenox from a surgery perspective, would hold on PO antiplatelet agent ID: [id number]  LOS: 16 days    Lannette Donathavid Okla Qazi, MD General Surgery  10/21/2020

## 2020-10-22 ENCOUNTER — Inpatient Hospital Stay (HOSPITAL_COMMUNITY): Payer: 59 | Admitting: Certified Registered Nurse Anesthetist

## 2020-10-22 ENCOUNTER — Encounter (HOSPITAL_COMMUNITY): Admission: EM | Disposition: E | Payer: Self-pay | Source: Home / Self Care | Attending: Pulmonary Disease

## 2020-10-22 DIAGNOSIS — I6601 Occlusion and stenosis of right middle cerebral artery: Secondary | ICD-10-CM | POA: Diagnosis not present

## 2020-10-22 DIAGNOSIS — Z66 Do not resuscitate: Secondary | ICD-10-CM | POA: Diagnosis not present

## 2020-10-22 DIAGNOSIS — J942 Hemothorax: Secondary | ICD-10-CM | POA: Diagnosis not present

## 2020-10-22 DIAGNOSIS — Z20822 Contact with and (suspected) exposure to covid-19: Secondary | ICD-10-CM | POA: Diagnosis not present

## 2020-10-22 DIAGNOSIS — I63311 Cerebral infarction due to thrombosis of right middle cerebral artery: Secondary | ICD-10-CM | POA: Diagnosis not present

## 2020-10-22 DIAGNOSIS — Z515 Encounter for palliative care: Secondary | ICD-10-CM | POA: Diagnosis not present

## 2020-10-22 DIAGNOSIS — R0603 Acute respiratory distress: Secondary | ICD-10-CM | POA: Diagnosis not present

## 2020-10-22 HISTORY — PX: VIDEO ASSISTED THORACOSCOPY (VATS)/DECORTICATION: SHX6171

## 2020-10-22 LAB — BPAM FFP
Blood Product Expiration Date: 202111262359
ISSUE DATE / TIME: 202111211511
Unit Type and Rh: 7300

## 2020-10-22 LAB — ABO/RH: ABO/RH(D): B POS

## 2020-10-22 LAB — CBC
HCT: 29 % — ABNORMAL LOW (ref 39.0–52.0)
HCT: 26.9 % — ABNORMAL LOW (ref 39.0–52.0)
HCT: 29.1 % — ABNORMAL LOW (ref 39.0–52.0)
HCT: 31 % — ABNORMAL LOW (ref 39.0–52.0)
Hemoglobin: 9.5 g/dL — ABNORMAL LOW (ref 13.0–17.0)
Hemoglobin: 8.8 g/dL — ABNORMAL LOW (ref 13.0–17.0)
Hemoglobin: 9.4 g/dL — ABNORMAL LOW (ref 13.0–17.0)
Hemoglobin: 10.1 g/dL — ABNORMAL LOW (ref 13.0–17.0)
MCH: 27.5 pg (ref 26.0–34.0)
MCH: 27.5 pg (ref 26.0–34.0)
MCH: 27.3 pg (ref 26.0–34.0)
MCH: 27.7 pg (ref 26.0–34.0)
MCHC: 32.8 g/dL (ref 30.0–36.0)
MCHC: 32.7 g/dL (ref 30.0–36.0)
MCHC: 32.3 g/dL (ref 30.0–36.0)
MCHC: 32.6 g/dL (ref 30.0–36.0)
MCV: 83.8 fL (ref 80.0–100.0)
MCV: 84.1 fL (ref 80.0–100.0)
MCV: 84.6 fL (ref 80.0–100.0)
MCV: 85.2 fL (ref 80.0–100.0)
Platelets: 186 10*3/uL (ref 150–400)
Platelets: 166 10*3/uL (ref 150–400)
Platelets: 207 10*3/uL (ref 150–400)
Platelets: 155 10*3/uL (ref 150–400)
RBC: 3.46 MIL/uL — ABNORMAL LOW (ref 4.22–5.81)
RBC: 3.2 MIL/uL — ABNORMAL LOW (ref 4.22–5.81)
RBC: 3.44 MIL/uL — ABNORMAL LOW (ref 4.22–5.81)
RBC: 3.64 MIL/uL — ABNORMAL LOW (ref 4.22–5.81)
RDW: 19 % — ABNORMAL HIGH (ref 11.5–15.5)
RDW: 18.8 % — ABNORMAL HIGH (ref 11.5–15.5)
RDW: 19 % — ABNORMAL HIGH (ref 11.5–15.5)
RDW: 17.8 % — ABNORMAL HIGH (ref 11.5–15.5)
WBC: 19.6 10*3/uL — ABNORMAL HIGH (ref 4.0–10.5)
WBC: 18.5 10*3/uL — ABNORMAL HIGH (ref 4.0–10.5)
WBC: 20.1 10*3/uL — ABNORMAL HIGH (ref 4.0–10.5)
WBC: 19.8 10*3/uL — ABNORMAL HIGH (ref 4.0–10.5)
nRBC: 0.5 % — ABNORMAL HIGH (ref 0.0–0.2)
nRBC: 0.3 % — ABNORMAL HIGH (ref 0.0–0.2)
nRBC: 0.3 % — ABNORMAL HIGH (ref 0.0–0.2)
nRBC: 0.3 % — ABNORMAL HIGH (ref 0.0–0.2)

## 2020-10-22 LAB — PREALBUMIN: Prealbumin: 8.2 mg/dL — ABNORMAL LOW (ref 18–38)

## 2020-10-22 LAB — COMPREHENSIVE METABOLIC PANEL
ALT: 99 U/L — ABNORMAL HIGH (ref 0–44)
AST: 170 U/L — ABNORMAL HIGH (ref 15–41)
Albumin: 1.3 g/dL — ABNORMAL LOW (ref 3.5–5.0)
Alkaline Phosphatase: 67 U/L (ref 38–126)
Anion gap: 8 (ref 5–15)
BUN: 61 mg/dL — ABNORMAL HIGH (ref 8–23)
CO2: 16 mmol/L — ABNORMAL LOW (ref 22–32)
Calcium: 6.8 mg/dL — ABNORMAL LOW (ref 8.9–10.3)
Chloride: 120 mmol/L — ABNORMAL HIGH (ref 98–111)
Creatinine, Ser: 2.74 mg/dL — ABNORMAL HIGH (ref 0.61–1.24)
GFR, Estimated: 25 mL/min — ABNORMAL LOW (ref >60)
Glucose, Bld: 116 mg/dL — ABNORMAL HIGH (ref 70–99)
Potassium: 4.7 mmol/L (ref 3.5–5.1)
Sodium: 144 mmol/L (ref 135–145)
Total Bilirubin: 1.7 mg/dL — ABNORMAL HIGH (ref 0.3–1.2)
Total Protein: 4.4 g/dL — ABNORMAL LOW (ref 6.5–8.1)

## 2020-10-22 LAB — DIFFERENTIAL
Abs Immature Granulocytes: 0 10*3/uL (ref 0.00–0.07)
Basophils Absolute: 0 10*3/uL (ref 0.0–0.1)
Basophils Relative: 0 %
Eosinophils Absolute: 0.4 10*3/uL (ref 0.0–0.5)
Eosinophils Relative: 2 %
Lymphocytes Relative: 6 %
Lymphs Abs: 1.1 10*3/uL (ref 0.7–4.0)
Monocytes Absolute: 0.9 10*3/uL (ref 0.1–1.0)
Monocytes Relative: 5 %
Neutro Abs: 16.1 10*3/uL — ABNORMAL HIGH (ref 1.7–7.7)
Neutrophils Relative %: 87 %
nRBC: 0 /100 WBC

## 2020-10-22 LAB — PREPARE FRESH FROZEN PLASMA: Unit division: 0

## 2020-10-22 LAB — PHOSPHORUS: Phosphorus: 4.9 mg/dL — ABNORMAL HIGH (ref 2.5–4.6)

## 2020-10-22 LAB — PROTIME-INR
INR: 1.3 — ABNORMAL HIGH (ref 0.8–1.2)
Prothrombin Time: 15.5 seconds — ABNORMAL HIGH (ref 11.4–15.2)

## 2020-10-22 LAB — MAGNESIUM: Magnesium: 2 mg/dL (ref 1.7–2.4)

## 2020-10-22 LAB — GLUCOSE, CAPILLARY
Glucose-Capillary: 102 mg/dL — ABNORMAL HIGH (ref 70–99)
Glucose-Capillary: 107 mg/dL — ABNORMAL HIGH (ref 70–99)
Glucose-Capillary: 142 mg/dL — ABNORMAL HIGH (ref 70–99)
Glucose-Capillary: 149 mg/dL — ABNORMAL HIGH (ref 70–99)
Glucose-Capillary: 202 mg/dL — ABNORMAL HIGH (ref 70–99)
Glucose-Capillary: 209 mg/dL — ABNORMAL HIGH (ref 70–99)

## 2020-10-22 LAB — PREPARE RBC (CROSSMATCH)

## 2020-10-22 LAB — CYTOLOGY - NON PAP

## 2020-10-22 LAB — TRIGLYCERIDES: Triglycerides: 123 mg/dL (ref <150)

## 2020-10-22 SURGERY — VIDEO ASSISTED THORACOSCOPY (VATS)/DECORTICATION
Anesthesia: General | Laterality: Left

## 2020-10-22 MED ORDER — TRAVASOL 10 % IV SOLN
INTRAVENOUS | Status: AC
  Filled 2020-10-22: qty 1094.4

## 2020-10-22 MED ORDER — DEXTROSE 10 % IV SOLN: INTRAVENOUS | Status: AC

## 2020-10-22 MED ORDER — BUPIVACAINE LIPOSOME 1.3 % IJ SUSP
20.0000 mL | Freq: Once | INTRAMUSCULAR | Status: AC
  Administered 2020-10-22: 20 mL
  Filled 2020-10-22: qty 20

## 2020-10-22 MED ORDER — VASOPRESSIN 20 UNIT/ML IV SOLN
INTRAVENOUS | Status: DC | PRN
  Administered 2020-10-22 (×2): 1 [IU] via INTRAVENOUS

## 2020-10-22 MED ORDER — VASOPRESSIN 20 UNIT/ML IV SOLN
INTRAVENOUS | Status: AC
  Filled 2020-10-22: qty 1

## 2020-10-22 MED ORDER — FLUCONAZOLE IN SODIUM CHLORIDE 200-0.9 MG/100ML-% IV SOLN
200.0000 mg | INTRAVENOUS | Status: DC
  Administered 2020-10-22 – 2020-11-02 (×12): 200 mg via INTRAVENOUS
  Filled 2020-10-22 (×12): qty 100

## 2020-10-22 MED ORDER — HEPARIN SODIUM (PORCINE) 1000 UNIT/ML IJ SOLN
INTRAMUSCULAR | Status: AC
  Filled 2020-10-22: qty 1

## 2020-10-22 MED ORDER — PHENYLEPHRINE 40 MCG/ML (10ML) SYRINGE FOR IV PUSH (FOR BLOOD PRESSURE SUPPORT)
PREFILLED_SYRINGE | INTRAVENOUS | Status: DC | PRN
  Administered 2020-10-22 (×3): 80 ug via INTRAVENOUS
  Administered 2020-10-22 (×2): 120 ug via INTRAVENOUS
  Administered 2020-10-22 (×2): 80 ug via INTRAVENOUS

## 2020-10-22 MED ORDER — PROPOFOL 10 MG/ML IV BOLUS
INTRAVENOUS | Status: AC
  Filled 2020-10-22: qty 20

## 2020-10-22 MED ORDER — ETOMIDATE 2 MG/ML IV SOLN
INTRAVENOUS | Status: AC
  Filled 2020-10-22: qty 10

## 2020-10-22 MED ORDER — 0.9 % SODIUM CHLORIDE (POUR BTL) OPTIME
TOPICAL | Status: DC | PRN
  Administered 2020-10-22: 2000 mL

## 2020-10-22 MED ORDER — MIDAZOLAM HCL 5 MG/5ML IJ SOLN
INTRAMUSCULAR | Status: DC | PRN
  Administered 2020-10-22 (×2): 1 mg via INTRAVENOUS

## 2020-10-22 MED ORDER — LIDOCAINE HCL (PF) 2 % IJ SOLN
INTRAMUSCULAR | Status: AC
  Filled 2020-10-22: qty 5

## 2020-10-22 MED ORDER — ALBUMIN HUMAN 5 % IV SOLN: INTRAVENOUS | Status: DC | PRN

## 2020-10-22 MED ORDER — PHENYLEPHRINE 40 MCG/ML (10ML) SYRINGE FOR IV PUSH (FOR BLOOD PRESSURE SUPPORT)
PREFILLED_SYRINGE | INTRAVENOUS | Status: AC
  Filled 2020-10-22: qty 10

## 2020-10-22 MED ORDER — ROCURONIUM BROMIDE 10 MG/ML (PF) SYRINGE
PREFILLED_SYRINGE | INTRAVENOUS | Status: AC
  Filled 2020-10-22: qty 20

## 2020-10-22 MED ORDER — CALCIUM CHLORIDE 10 % IV SOLN
INTRAVENOUS | Status: DC | PRN
  Administered 2020-10-22 (×6): 100 mg via INTRAVENOUS

## 2020-10-22 MED ORDER — ROCURONIUM BROMIDE 100 MG/10ML IV SOLN
INTRAVENOUS | Status: DC | PRN
  Administered 2020-10-22: 20 mg via INTRAVENOUS
  Administered 2020-10-22: 30 mg via INTRAVENOUS
  Administered 2020-10-22: 50 mg via INTRAVENOUS

## 2020-10-22 MED ORDER — SODIUM CHLORIDE 0.9% IV SOLUTION: Freq: Once | INTRAVENOUS | Status: AC

## 2020-10-22 MED ORDER — FENTANYL CITRATE (PF) 250 MCG/5ML IJ SOLN
INTRAMUSCULAR | Status: AC
  Filled 2020-10-22: qty 5

## 2020-10-22 MED ORDER — SODIUM CHLORIDE 0.9 % IV SOLN: INTRAVENOUS | Status: DC | PRN

## 2020-10-22 MED ORDER — MIDAZOLAM HCL 2 MG/2ML IJ SOLN
INTRAMUSCULAR | Status: AC
  Filled 2020-10-22: qty 2

## 2020-10-22 MED ORDER — BUPIVACAINE LIPOSOME 1.3 % IJ SUSP: INTRAMUSCULAR | Status: DC | PRN

## 2020-10-22 MED ORDER — EPHEDRINE 5 MG/ML INJ
INTRAVENOUS | Status: AC
  Filled 2020-10-22: qty 10

## 2020-10-22 MED ORDER — PHENYLEPHRINE CONCENTRATED 100MG/250ML (0.4 MG/ML) INFUSION SIMPLE
0.0000 ug/min | INTRAVENOUS | Status: DC
  Administered 2020-10-22: 80 ug/min via INTRAVENOUS
  Filled 2020-10-22 (×2): qty 250

## 2020-10-22 MED ORDER — ONDANSETRON HCL 4 MG/2ML IJ SOLN
INTRAMUSCULAR | Status: AC
  Filled 2020-10-22: qty 2

## 2020-10-22 MED ORDER — ONDANSETRON HCL 4 MG/2ML IJ SOLN
INTRAMUSCULAR | Status: DC | PRN
  Administered 2020-10-22: 4 mg via INTRAVENOUS

## 2020-10-22 MED ORDER — LACTATED RINGERS IV SOLN: INTRAVENOUS | Status: DC | PRN

## 2020-10-22 SURGICAL SUPPLY — 91 items
ADH SKN CLS APL DERMABOND .7 (GAUZE/BANDAGES/DRESSINGS) ×1
APL PRP STRL LF DISP 70% ISPRP (MISCELLANEOUS) ×1
APL SRG 22X2 LUM MLBL SLNT (VASCULAR PRODUCTS)
APL SRG 7X2 LUM MLBL SLNT (VASCULAR PRODUCTS)
APPLICATOR TIP COSEAL (VASCULAR PRODUCTS) IMPLANT
APPLICATOR TIP EXT COSEAL (VASCULAR PRODUCTS) IMPLANT
BLADE CLIPPER SURG (BLADE) IMPLANT
BLADE SURG 11 STRL SS (BLADE) ×2 IMPLANT
CANISTER SUCT 3000ML PPV (MISCELLANEOUS) ×2 IMPLANT
CATH THORACIC 28FR (CATHETERS) ×2 IMPLANT
CATH THORACIC 36FR (CATHETERS) IMPLANT
CATH THORACIC 36FR RT ANG (CATHETERS) IMPLANT
CHLORAPREP W/TINT 26 (MISCELLANEOUS) ×2 IMPLANT
CLEANER TIP ELECTROSURG 2X2 (MISCELLANEOUS) IMPLANT
CNTNR URN SCR LID CUP LEK RST (MISCELLANEOUS) ×2 IMPLANT
CONN ST 1/4X3/8  BEN (MISCELLANEOUS)
CONN ST 1/4X3/8 BEN (MISCELLANEOUS) IMPLANT
CONN Y 3/8X3/8X3/8  BEN (MISCELLANEOUS)
CONN Y 3/8X3/8X3/8 BEN (MISCELLANEOUS) IMPLANT
CONT SPEC 4OZ STRL OR WHT (MISCELLANEOUS) ×4
COVER BACK TABLE 60X90IN (DRAPES) ×2 IMPLANT
COVER SURGICAL LIGHT HANDLE (MISCELLANEOUS) IMPLANT
DEFOGGER SCOPE WARMER CLEARIFY (MISCELLANEOUS) ×2 IMPLANT
DERMABOND ADVANCED (GAUZE/BANDAGES/DRESSINGS) ×1
DERMABOND ADVANCED .7 DNX12 (GAUZE/BANDAGES/DRESSINGS) ×1 IMPLANT
DISSECTOR BLUNT TIP ENDO 5MM (MISCELLANEOUS) IMPLANT
DRAIN CHANNEL 28F RND 3/8 FF (WOUND CARE) IMPLANT
ELECT BLADE 4.0 EZ CLEAN MEGAD (MISCELLANEOUS) ×2
ELECT BLADE 6.5 EXT (BLADE) ×2 IMPLANT
ELECT REM PT RETURN 9FT ADLT (ELECTROSURGICAL) ×2
ELECTRODE BLDE 4.0 EZ CLN MEGD (MISCELLANEOUS) ×1 IMPLANT
ELECTRODE REM PT RTRN 9FT ADLT (ELECTROSURGICAL) ×1 IMPLANT
GAUZE SPONGE 4X4 12PLY STRL (GAUZE/BANDAGES/DRESSINGS) ×2 IMPLANT
GLOVE BIO SURGEON STRL SZ 6 (GLOVE) ×4 IMPLANT
GLOVE BIO SURGEON STRL SZ7.5 (GLOVE) ×4 IMPLANT
GOWN STRL REUS W/ TWL LRG LVL3 (GOWN DISPOSABLE) ×3 IMPLANT
GOWN STRL REUS W/ TWL XL LVL3 (GOWN DISPOSABLE) ×1 IMPLANT
GOWN STRL REUS W/TWL LRG LVL3 (GOWN DISPOSABLE) ×6
GOWN STRL REUS W/TWL XL LVL3 (GOWN DISPOSABLE) ×2
KIT BASIN OR (CUSTOM PROCEDURE TRAY) ×2 IMPLANT
KIT SUCTION CATH 14FR (SUCTIONS) ×2 IMPLANT
KIT TURNOVER KIT B (KITS) ×2 IMPLANT
NEEDLE 18GX1X1/2 (RX/OR ONLY) (NEEDLE) ×2 IMPLANT
NEEDLE 22X1 1/2 (OR ONLY) (NEEDLE) ×2 IMPLANT
NS IRRIG 1000ML POUR BTL (IV SOLUTION) ×4 IMPLANT
PACK CHEST (CUSTOM PROCEDURE TRAY) ×2 IMPLANT
PACK UNIVERSAL I (CUSTOM PROCEDURE TRAY) ×2 IMPLANT
PAD ARMBOARD 7.5X6 YLW CONV (MISCELLANEOUS) ×4 IMPLANT
PASSER SUT SWANSON 36MM LOOP (INSTRUMENTS) IMPLANT
SCISSORS LAP 5X35 DISP (ENDOMECHANICALS) IMPLANT
SEALANT SURG COSEAL 4ML (VASCULAR PRODUCTS) IMPLANT
SEALANT SURG COSEAL 8ML (VASCULAR PRODUCTS) IMPLANT
STOPCOCK 4 WAY LG BORE MALE ST (IV SETS) IMPLANT
SUT PROLENE 3 0 SH DA (SUTURE) IMPLANT
SUT PROLENE 4 0 RB 1 (SUTURE)
SUT PROLENE 4-0 RB1 .5 CRCL 36 (SUTURE) IMPLANT
SUT SILK  1 MH (SUTURE) ×1
SUT SILK 1 MH (SUTURE) ×1 IMPLANT
SUT SILK 1 TIES 10X30 (SUTURE) IMPLANT
SUT SILK 2 0SH CR/8 30 (SUTURE) IMPLANT
SUT SILK 3 0SH CR/8 30 (SUTURE) IMPLANT
SUT VIC AB 1 CTX 18 (SUTURE) IMPLANT
SUT VIC AB 1 CTX 36 (SUTURE)
SUT VIC AB 1 CTX36XBRD ANBCTR (SUTURE) IMPLANT
SUT VIC AB 2-0 CT1 27 (SUTURE) ×2
SUT VIC AB 2-0 CT1 TAPERPNT 27 (SUTURE) ×1 IMPLANT
SUT VIC AB 2-0 CTX 36 (SUTURE) IMPLANT
SUT VIC AB 3-0 SH 27 (SUTURE) ×2
SUT VIC AB 3-0 SH 27X BRD (SUTURE) ×1 IMPLANT
SUT VIC AB 3-0 X1 27 (SUTURE) IMPLANT
SUT VICRYL 0 UR6 27IN ABS (SUTURE) ×2 IMPLANT
SUT VICRYL 2 TP 1 (SUTURE) IMPLANT
SWAB COLLECTION DEVICE MRSA (MISCELLANEOUS) IMPLANT
SWAB CULTURE ESWAB REG 1ML (MISCELLANEOUS) IMPLANT
SYR 10ML LL (SYRINGE) IMPLANT
SYR 20ML LL LF (SYRINGE) ×2 IMPLANT
SYR 50ML LL SCALE MARK (SYRINGE) IMPLANT
SYR BULB IRRIG 60ML STRL (SYRINGE) ×2 IMPLANT
SYR CONTROL 10ML LL (SYRINGE) ×2 IMPLANT
SYSTEM SAHARA CHEST DRAIN ATS (WOUND CARE) ×2 IMPLANT
TAPE CLOTH 4X10 WHT NS (GAUZE/BANDAGES/DRESSINGS) ×2 IMPLANT
TAPE CLOTH SURG 4X10 WHT LF (GAUZE/BANDAGES/DRESSINGS) ×2 IMPLANT
TAPE UMBILICAL COTTON 1/8X30 (MISCELLANEOUS) ×2 IMPLANT
TIP APPLICATOR SPRAY EXTEND 16 (VASCULAR PRODUCTS) IMPLANT
TOWEL GREEN STERILE (TOWEL DISPOSABLE) ×4 IMPLANT
TRAP SPECIMEN MUCUS 40CC (MISCELLANEOUS) IMPLANT
TRAY FOLEY SLVR 16FR LF STAT (SET/KITS/TRAYS/PACK) ×2 IMPLANT
TROCAR XCEL 12X100 BLDLESS (ENDOMECHANICALS) IMPLANT
TROCAR XCEL BLADELESS 5X75MML (TROCAR) ×2 IMPLANT
TUBING EXTENTION W/L.L. (IV SETS) IMPLANT
WATER STERILE IRR 1000ML POUR (IV SOLUTION) ×2 IMPLANT

## 2020-10-22 NOTE — Anesthesia Preprocedure Evaluation (Addendum)
Anesthesia Evaluation  Patient identified by MRN, date of birth, ID band Patient unresponsive    Reviewed: Allergy & Precautions, NPO status , Patient's Chart, lab work & pertinent test results  Airway Mallampati: Intubated       Dental   Pulmonary  Intubated      + intubated    Cardiovascular hypertension, Pt. on medications Normal cardiovascular exam     Neuro/Psych CVA negative psych ROS   GI/Hepatic negative GI ROS, Neg liver ROS,   Endo/Other  negative endocrine ROS  Renal/GU CRFRenal disease     Musculoskeletal Gout   Abdominal   Peds  Hematology  (+) anemia ,   Anesthesia Other Findings hemothorax  Reproductive/Obstetrics                            Anesthesia Physical Anesthesia Plan  ASA: IV  Anesthesia Plan: General   Post-op Pain Management:    Induction: Intravenous  PONV Risk Score and Plan: 2 and Ondansetron, Dexamethasone, Midazolam and Treatment may vary due to age or medical condition  Airway Management Planned: Oral ETT  Additional Equipment: Arterial line  Intra-op Plan:   Post-operative Plan: Post-operative intubation/ventilation  Informed Consent: I have reviewed the patients History and Physical, chart, labs and discussed the procedure including the risks, benefits and alternatives for the proposed anesthesia with the patient or authorized representative who has indicated his/her understanding and acceptance.     Dental advisory given and Consent reviewed with POA  Plan Discussed with: CRNA  Anesthesia Plan Comments: (Anesthetic plan discussed with daughter in [place] ICU.)       Anesthesia Quick Evaluation

## 2020-10-22 NOTE — Op Note (Signed)
     [address]       [place] [address]             [phone]       10/11/2020 Patient:  Mark May Pre-Op Dx: Left hemothorax   Respiratory failure Post-op Dx: Same Procedure: -Left video assisted thoracoscopy - Decortication - Intercostal nerve block  Surgeon and Role:      * Aryanne Gilleland, Eliezer Lofts, MD - Primary  Anesthesia  general EBL: 50 ml Blood Administration: 1 unit of packed red blood cells Specimen: Pleural peel  Drains: 28 F argyle chest tube in left chest Counts: correct    Indications: Mark May 64 y.o. male originally admitted on 10/27/2020 with left-sided weakness and facial droop.  During his hospitalization he is also developed a bowel perforation with an intra-abdominal abscess.  He has been quite debilitated, and developed a right-sided pneumonia likely due to aspiration.  On 10/20/2020 he underwent a left-sided thoracentesis and subsequently developed worsening respiratory failure.  Imaging was consistent with an enlarging left-sided pleural effusion concerning for hemothorax.  There was some evidence of extravasation from one of the intercostal arteries.  CTS was consulted to assist with management.  Findings: Large amount of old blood and clot at the base of his lung.  There was a peel involving the lower lobe.  We achieve good expansion at the completion of the decortication.  Operative Technique: After the risks, benefits and alternatives were thoroughly discussed, the patient was brought to the operative theatre.  Anesthesia was induced, the patient was then placed in a right lateral decubitus position and was prepped and draped in normal sterile fashion.  An appropriate surgical pause was performed, and pre-operative antibiotics were dosed accordingly.  We began with 2cm incision in the anterior axillary line at the 5th intercostal space.  The chest was entered, and we then placed a 1cm incision at the 10th intercostal space, and  introduced our camera port.  The lung was directly visualized.  There was old blood and clots throughout the pleural space.  Most of the clot was at the base of the diaphragm.  There is no source of active bleeding..   The lung was then mobilized off of the chest wall.  The pleural peal was carefully decorticated off of each lobe.  The fissure was then mobilized.  We achieved good expansion of the lung.  The chest was then irrigated.    An intercostal nerve block was performed under direct visualization.  A 28 F chest tube was then placed, and we watch the lung re-expand.  The skin and soft tissue were closed with absorbable suture    The patient tolerated the procedure without any immediate complications, and was transferred to the PACU in stable condition.  Sumner Boesch Keane Scrape

## 2020-10-22 NOTE — Anesthesia Postprocedure Evaluation (Signed)
Anesthesia Post Note  Patient: Mark May  Procedure(s) Performed: VIDEO ASSISTED THORACOSCOPY (VATS)/DECORTICATION (Left )     Patient location during evaluation: ICU Anesthesia Type: General Level of consciousness: sedated and patient remains intubated per anesthesia plan Pain management: pain level controlled Vital Signs Assessment: post-procedure vital signs reviewed and stable Respiratory status: patient remains intubated per anesthesia plan Cardiovascular status: stable Postop Assessment: no apparent nausea or vomiting Anesthetic complications: no   No complications documented.  Last Vitals:  Vitals:   10/14/2020 1559 10/02/2020 1601  BP:    Pulse:    Resp:    Temp:  (!) 36.4 C  SpO2: 100%     Last Pain:  Vitals:   10/11/2020 1601  TempSrc: Axillary  PainSc:                  Beryle Lathe

## 2020-10-22 NOTE — Progress Notes (Signed)
SLP Cancellation Note  Patient Details Name: Mark May MRN: [id number] DOB: [birth year]   Cancelled treatment:       Reason Eval/Treat Not Completed: Medical issues which prohibited therapy;Patient not medically ready. Pt now ventilated with further complications. Will sign off for swallowing interventions and await new orders when medically ready.    Marymargaret Kirker, Riley Nearing 10/03/2020, 7:24 AM

## 2020-10-22 NOTE — Progress Notes (Signed)
NAME:  Mark May, MRN:  [id number], DOB:  1956-02-01, LOS: 17 ADMISSION DATE:  10/18/2020, CONSULTATION DATE: 10/03/2020 REFERRING MD: Dr. Curtis Sites, CHIEF COMPLAINT: Left-sided weakness  Brief History   64 year old male with HTN and HLD who presented with left-sided weakness, noted to have right MCA occlusion status post thrombectomy and stent placement 11/5.  Hospitalization complicated by hypoxic respiratory failure secondary to pulmonary edema requiring BiPAP and lasix.  Awaiting CIR placement however, on the evening of 11/10, he was noted to become more lethargic with tachycardia and tachypnea.  Had complained of abdominal pain with some distention and chest pain which resolved after burping.  KUB obtained which was normal.  On 11/11, he was somewhat more responsive but now with increasing sCr in which lisinopril was stopped and development of fever 102.6  [facility] initially consulted for help with medical management however on their evaluation, PCCM consulted given ill appearance and developing hypotension and new onset Afib with RVR found to have sepsis with RLL pneumonia likely due to aspiration started on zosyn. PCCM consulted for continue right abdominal pain and distension with concern for bowel perforation secondary to Cortrak.  On 11/13, CT abd/ pelvis showed large right perinephric fluid collection and pockets of air concerning for an infectious process/abscess. Underwent CT guided drain placement of right retroperitoneal abscess.  On 11/15, he developed melena with anemia.  GI consulted with plans to medically management and deferred EGD.  Surgery was consulted on 11/17 with imaging concerning for possible contained bowel perforation.  On 11/20, he developed respiratory distress requiring intubation. Underwent thoracentesis on 11/20.  Hypotensive with Hgb drob on 11/21, CT ABD/Pelvis w/ concern for active RP bleed.  Subsequently, developed hemothorax requiring OR for VATS on 11/22.    Past  Medical History  Hypertension Peptic ulcer disease, previous partial gastrectomy (in [place]/ 1990) Gout BPH Sciatica   Significant Hospital Events   11/05 Mechanical thrombectomy of right MCA and stent 11/5 11/11 PCCM reconsulted  11/12 Cortrak placed 11/14 CT guided drainage for R RP abscess with JP 11/17 PCCM reconsulted 11/18 CCM recalled by rapid response, fever, resp distress, CXR new left infiltrates with left pleural effusion 11/20 Thoracentesis  11/21 Hypotensive with Hgb drop to 6.1 s/p 2 units PRBC, CT ABD with concerning for active bleeding 11/22 To OR for VATS, decortication   Consults:  IR Neurology PCCM 11/5- 11/8; 11/11; 11/16-11/17, 11/18 -  [facility] Urology 11/13 GI 11/15 CCS 11/17  Procedures:  R Femoral Sheath 11/6 >> 11/6 RUE PICC 11/17 >>  ETT 11/20 >>   Significant Diagnostic Tests:  11/5 CT head >> No acute abnormality.  11/5 CTA head / neck >> Severe distal right M1 stenosis or subocclusive thrombus, Right MCA infarct with extensive penumbra, 4 mm right supraclinoid ICA aneurysm. Widely patent cervical carotid and vertebral arteries. 11/5 Postprocedure CT head >> Hypodensity in the right lateral temporal lobe now shows mild hemorrhage or contrast enhancement. This is most consistent with acute infarct. There has been interval stenting of the right middle cerebral artery. 11/6 MRI brain >> Fairly extensive patchy acute/early subacute infarcts within the right MCA/watershed territory.  Additional small acute/early subacute infarcts within the dorsal right thalamus and left basal ganglia. 11/6  MRA head >>  Interval stenting of the M1/M2 right middle cerebral artery. Flow related signal is present proximal and distal to the stent suggestive of stent patency. 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 11/6 TTE >> LVEF 70-75%, no RWMA,  Grade II diastolic dysfunction, elevated LA pressure 11/13 CT ABD/Pelvis >> large right  perinephric fluid collection and pockets of air concerning for an infectious process/abscess. Cholelithiasis. Nonobstructing right renal calculi. No hydronephrosis. Colonic diverticulosis. No bowel obstruction. Partially visualized small bilateral pleural effusions with complete consolidative changes of the visualized lower lobes. 11/16 CT ABD/Pelvis >> interval placement of drainage catheter into the right perinephric space in the area of previously seen gas and fluid collection. Fluid collection has decreased since prior study. There is now contrast material seen within the perinephric space and extending into the right paracolic gutter. This is concerning for possible fistulous communication to the colon. May consider contrast injection through the drainage catheter under fluoroscopic guidance to assess for enteric fistula. Moderate bilateral pleural effusions with bibasilar atelectasis or consolidation, unchanged.  Cholelithiasis.  NG tube in the stomach.  Aortic atherosclerosis.  Small to moderate free fluid in the pelvis. 11/20 L Thoracentesis 650 ml cloudy yellow fluid 11/21 CT abd/ pelvis >> very large area of intra-abdominal fluid and blood within the left upper quadrant, with an appearance worrisome for active bleeding.  Stable position of the right-sided percutaneous drainage catheter with a moderate amount of surrounding contained free air, fluid and inflammatory fat stranding. Fistulous communication with the adjacent portion of large bowel cannot be excluded. Moderate to marked amount of para muscular subcutaneous inflammatory fat stranding along the lateral aspects of the left abdominal and pelvic walls.   Micro Data:  MRSA PCR 11/5 >> negative COVID 11/5 >> negative Flu 11/5 >> negative UC 11/10 >> neg BCx2 11/11 >> neg BC x2 11/13 >> neg Right RRP abscess 11/14 >> moderate candida, klebsiella >>  BCx 2 11/19 >> Left pleural fluid 11/20 >> Klebsiella >>   Antimicrobials:  Cefazolin  11/5 >> 11/6 Flagyl 11/11 x1 Cefepime 11/11, 11/18 >> 11/19  Vanco 11/12 >> 11/16, 11/18  Unasyn 11/17 >> 11/18  Flagyl 11/18 >> 11/19  Diflucan 11/17 >>  Zosyn 11/11 >> 11/17, 11/19 >>   Interim history/subjective:  Plan for VATS procedure today with Dr. Cliffton Asters Patient intubated and sedate on Fentanyl 250 and 60 of Neo PRVC 520 X 15 +5 30% 100.6; WBC 20.1 Glucose range 95-138 UO 730 (24 hours); total I/O +2.5L in last 24 hours   Objective   Blood pressure (!) 89/62, pulse 79, temperature 98.3 F (36.8 C), temperature source Axillary, resp. rate 15, weight 59.3 kg, SpO2 98 %. CVP:  [12 mmHg-14 mmHg] 12 mmHg  Vent Mode: PRVC FiO2 (%):  [30 %] 30 % Set Rate:  [15 bmp] 15 bmp Vt Set:  [520 mL] 520 mL PEEP:  [5 cmH20] 5 cmH20 Plateau Pressure:  [22 cmH20-27 cmH20] 22 cmH20   Intake/Output Summary (Last 24 hours) at 11/05/2020 1345 Last data filed at 11/05/2020 1300 Gross per 24 hour  Intake 4923.72 ml  Output 1320 ml  Net 3603.72 ml   Filed Weights   10/20/20 0500 10/21/20 0424 05-Nov-2020 0100  Weight: 56.1 kg 56.1 kg 59.3 kg   Physical Exam General: Cachectic male intubated on mechanical life support, NG tube in place HEENT: Attempts to track, opens eyes to voice with sedation lightened Neuro: Sedated on mechanical support CV: Regular rhythm, S1-S2 PULM: Bilateral mechanically ventilated breath sounds GI: Soft nontender nondistended Extremities: Dependent edema in upper extremities and lower extremities Skin: No significant rash    Resolved Hospital Problems:  Hypernatremia  Assessment & Plan:   Complex clinical course: admitted with R MCA CVA. Developed  Acute respiratory failure secondary to aspiration pneumonia with bilateral pleural effusion, left klebsiella empyema  Hemothorax  S/p thora on 11/20 with exudative properties, klebsiella in pleural culture & RP abscess.  Concern for post thora extravasation.   -PRVC 8cc/kg  -Daily SBT / WUA  -follow  intermittent CXR -VAP prevention measures  -appreciate Dr. Lucilla Lame assistance with patient care  -PAD protocol for RASS goal 0 to -1   Mixed Shock - Septic + ABLA / Hypovolemic Multifactorial, ABLA and septic. S/p 2 units blood on 11/21.  Klebsiella Empyema + fluid collection in abd.  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 Superior-most edge lines up with distal end of cortrak. -appreciate CCS input > rec's for bowel rest and TPN  -NGT to LIS  AKI  Suspect secondary to hypoperfusion with ABLA -Trend BMP / urinary output -Replace electrolytes as indicated -Avoid nephrotoxic agents, ensure adequate renal perfusion  PAF with RVR -tele monitoring  -unable to anticoagulate due to bleeding   Acute R MCA CVA s/p thrombectomy and right MCA stent placement by IR with TICI 3 Left hemiplegia Dysphagia -ASA, brilinta stopped with bowel rest, avoid cangrelor with high risk bleeding  -avoid fever / neuro protective measures  Severe Protein Calorie Malnutrition -TPN per pharmacy  -defer timing of feeding to CCS   Best practice:  Diet: bowel rest/ NPO. Continue TPN Pain/Anxiety/Delirium protocol (if indicated):  Fentanyl gtt  VAP protocol (if indicated): Needs DENTAL consult. Oral care protocol DVT prophylaxis: SCDs for now GI prophylaxis: PPI BID Glucose control: SSI Mobility: bed rest Code Status: full code Family Communication: Updated per Dr. Tonia Brooms  11/22 Disposition:  ICU  LABS    PULMONARY Recent Labs  Lab 10/16/20 0012 10/18/20 2208 10/20/20 0359  PHART 7.495* 7.532* 7.302*  PCO2ART 26.7* 22.4* 42.6  PO2ART 76.5* 56.7* 76*  HCO3 20.4 19.0* 21.1  TCO2  --   --  22  O2SAT 95.1 92.3 94.0    CBC Recent Labs  Lab 10/19/2020 0232 10/06/2020 0446 10/21/2020 0948  HGB 9.5* 8.8* 9.4*  HCT 29.0* 26.9* 29.1*  WBC 19.6* 18.5* 20.1*  PLT 186 166 207    COAGULATION Recent Labs  Lab 10/21/20 0139 10/25/2020 0446  INR 1.7* 1.3*    CARDIAC  No results for input(s): TROPONINI in the last 168 hours. No results for input(s): PROBNP in the last 168 hours.   CHEMISTRY Recent Labs  Lab 10/17/20 0920 10/17/20 0920 10/18/20 0726 10/18/20 0726 10/19/20 0032 10/19/20 0032 10/20/20 0359 10/20/20 0359 10/20/20 2778 10/20/20 0632 10/21/20 1737 10/30/2020 0446  NA 147*   < > 148*   < > 148*  --  149*  --  146*  --  144 144  K 3.1*   < > 3.6   < > 3.6   < > 3.9   < > 4.0   < > 5.6* 4.7  CL 119*   < > 121*  --  121*  --   --   --  119*  --  118* 120*  CO2 19*   < > 19*  --  20*  --   --   --  19*  --  17* 16*  GLUCOSE 158*   < > 210*  --  130*  --   --   --  341*  --  209* 116*  BUN 33*   < > 27*  --  26*  --   --   --  35*  --  66* 61*  CREATININE 1.10   < > 1.07  --  1.03  --   --   --  1.20  --  2.62* 2.74*  CALCIUM 7.4*   < > 7.3*  --  7.4*  --   --   --  7.5*  --  7.3* 6.8*  MG 2.2  --  2.2  --  2.2  --   --   --  2.1  --   --  2.0  PHOS 4.0  --  2.7  --  3.0  --   --   --  3.7  --   --  4.9*   < > = values in this interval not displayed.   Estimated Creatinine Clearance: 22.5 mL/min (A) (by C-G formula based on SCr of 2.74 mg/dL (H)).   LIVER Recent Labs  Lab 10/18/20 0726 10/21/20 0139 10/21/20 0627 10/21/20 1737 10/26/2020 0446  AST 38  --   --  144* 170*  ALT 44  --   --  80* 99*  ALKPHOS 142*  --   --  70 67  BILITOT 1.1  --   --  1.6* 1.7*  PROT 5.1*  --  3.7* 5.0* 4.4*  ALBUMIN 1.3*  --   --  1.5* 1.3*   INR  --  1.7*  --   --  1.3*    INFECTIOUS Recent Labs  Lab 10/18/20 2258 10/19/20 0032 10/20/20 0632  LATICACIDVEN 1.6 1.5  --   PROCALCITON  --  1.38 1.96    ENDOCRINE CBG (last 3)  Recent Labs    10/11/2020 0336 10/20/2020 0836 10/07/2020 1140  GLUCAP 107* 102* 142*    IMAGING  CT ABDOMEN PELVIS WO CONTRAST  Result Date: 10/21/2020 CLINICAL DATA:  Abdominal pain and abdominal distension. EXAM: CT ABDOMEN AND PELVIS WITHOUT CONTRAST TECHNIQUE: Multidetector CT imaging of the abdomen and pelvis was performed following the standard protocol without IV contrast. COMPARISON:  October 16, 2020 FINDINGS: Lower chest: Moderate severity areas of consolidation are seen within the bilateral lung bases. There is a large left pleural effusion with suspected loculated components. This is increased in size when compared to the prior study. A small to moderate sized right pleural effusion is also noted. Hepatobiliary: No focal liver abnormality is seen. Numerous subcentimeter gallstones are seen within the lumen of a normal appearing gallbladder. There is no evidence of biliary dilatation. Pancreas: Unremarkable. No pancreatic ductal dilatation or surrounding inflammatory changes. Spleen: The spleen is displaced within the anteromedial aspect of the left upper quadrant the. A 15.5 cm x 11.3 cm x 16.9 cm area containing a mixture of hemorrhagic and non hemorrhagic fluid is seen within the left upper quadrant. This appears to be retroperitoneal in location and represents a new finding when compared to the prior exam. Adrenals/Urinary Tract: Adrenal glands are unremarkable. Kidneys are normal in size. Multiple stable bilateral renal cysts of various sizes are seen. Multiple 2 mm, 3 mm and 4 mm nonobstructing renal stones are seen throughout  the right kidney. A mild amount of air is again seen within the lumen of the urinary bladder. Stomach/Bowel: A nasogastric tube is seen with its distal tip noted within  the body of the stomach. The appendix is not clearly identified. No evidence of bowel dilatation. A 2.1 cm x 1.3 cm area of oral contrast is seen adjacent to the posteromedial aspect of the cecum (axial CT image 69, CT series number 3). This is present on the prior study. Vascular/Lymphatic: There is moderate severity calcification of the abdominal aorta and bilateral common iliac arteries, without evidence of aneurysmal dilatation. No enlarged abdominal or pelvic lymph nodes. Reproductive: Prostate is unremarkable. Other: A percutaneous drainage catheter is again seen entering via the right flank. Its distal tip is noted along the posterior aspect of the right lower quadrant and is unchanged in position when compared to the prior study. Numerous foci of moderate severity contained free air are again seen within this region. Persistent surrounding mesenteric inflammatory fat stranding and mesenteric fluid is also noted. A small amount of posterior pelvic free fluid is seen. This is stable in appearance when compared to the prior study. Musculoskeletal: A moderate to marked amount of para muscular subcutaneous inflammatory fat stranding is seen along the lateral aspects of the left abdominal and pelvic walls. No acute or significant osseous findings. IMPRESSION: 1. Very large area of intra-abdominal fluid and blood within the left upper quadrant, with an appearance worrisome for active bleeding. 2. Large left pleural effusion with a small to moderate sized right pleural effusion. 3. Moderate severity bibasilar consolidation which may represent atelectasis and/or pneumonia. 4. Cholelithiasis. 5. Multiple stable bilateral renal cysts of various sizes. 6. Stable position of the right-sided percutaneous drainage catheter with a moderate amount of surrounding contained free air, fluid and inflammatory fat stranding. Fistulous communication with the adjacent portion of large bowel cannot be excluded. 7. Moderate to marked  amount of para muscular subcutaneous inflammatory fat stranding along the lateral aspects of the left abdominal and pelvic walls. 8. Moderate to marked amount of para muscular. 9. Aortic atherosclerosis. Aortic Atherosclerosis (ICD10-I70.0). Electronically Signed   By: Aram Candela M.D.   On: 10/21/2020 01:53   CT CHEST W CONTRAST  Result Date: 10/21/2020 CLINICAL DATA:  Dropping hematocrit, large hemothorax following thoracentesis yesterday EXAM: CT CHEST, ABDOMEN, AND PELVIS WITH CONTRAST TECHNIQUE: Multidetector CT imaging of the chest, abdomen and pelvis was performed following the standard protocol during bolus administration of intravenous contrast. CONTRAST:  OMNIPAQUE IOHEXOL 300 MG/ML  SOLN COMPARISON:  CT abdomen pelvis, 10/21/2020, 1:09 a.m. FINDINGS: CT CHEST FINDINGS Cardiovascular: Aortic atherosclerosis. Right upper extremity PICC, tip near the superior cavoatrial junction. Normal heart size. No pericardial effusion. Mediastinum/Nodes: No enlarged mediastinal, hilar, or axillary lymph nodes. Thyroid is normal. Endotracheal and esophagogastric intubation. Lungs/Pleura: Large left, moderate right pleural effusions and associated atelectasis or consolidation. There is extensive, heterogeneous high density in the inferior aspect of the left pleural effusion, consistent with a large hematoma, given relatively circumscribed appearing appears to be extrapleural and measures approximately 15.3 x 12.1 x 8.5 cm (series 6, image 41). There appears to be a small focus of contrast extravasation adjacent to the inferior margin of the posterolateral left tenth rib (series 6, image 64). Musculoskeletal: No chest wall mass or suspicious bone lesions identified. CT ABDOMEN PELVIS FINDINGS Hepatobiliary: No solid liver abnormality is seen. Gallstones and sludge in the gallbladder. No gallbladder wall thickening, or biliary dilatation. Pancreas: Unremarkable. No pancreatic ductal dilatation or  surrounding  inflammatory changes. Spleen: Normal in size without significant abnormality. Adrenals/Urinary Tract: Adrenal glands are unremarkable. Multiple nonobstructive right renal calculi. No left-sided calculi, ureteral calculi, or hydronephrosis. Small air-fluid level within the urinary bladder, similar to prior. Stomach/Bowel: Stomach is within normal limits. Appendix is not clearly visualized. No evidence of bowel wall thickening, distention, or inflammatory changes. Vascular/Lymphatic: Aortic atherosclerosis. No enlarged abdominal or pelvic lymph nodes. Reproductive: No mass or other abnormality. Other: Anasarca. Redemonstrated percutaneous pigtail drainage catheter positioned in a right retroperitoneal air and fluid collection, not significantly changed compared to prior examination (series 3, image 79). Small volume fluid attenuation ascites in the low pelvis (series 3, image 105). Musculoskeletal: No acute or significant osseous findings. IMPRESSION: 1. There is a large left pleural effusion, which contains a circumscribed appearing region of heterogeneous high density inferiorly measuring approximately 15.3 x 12.1 x 8.5 cm. This is most consistent with a large extrapleural hematoma and there appears to be a small focus of contrast extravasation adjacent to the inferior margin of the posterolateral left tenth rib, most consistent with an intercostal artery extravasation following thoracentesis. This is not significantly changed compared to prior same day noncontrast examination. 2. Moderate right pleural effusion. 3. Redemonstrated percutaneous pigtail drainage catheter positioned in a right retroperitoneal air and fluid collection, not significantly changed compared to prior examination. 4. Small volume fluid attenuation ascites in the low pelvis. 5. Anasarca. 6. Other chronic and incidental findings as above. Aortic Atherosclerosis (ICD10-I70.0). Findings discussed by telephone with Dr. Barton Fanny at 1:15 p.m.,  10/21/2020. Electronically Signed   By: Lauralyn Primes M.D.   On: 10/21/2020 14:01   CT ABDOMEN PELVIS W CONTRAST  Result Date: 10/21/2020 CLINICAL DATA:  Dropping hematocrit, large hemothorax following thoracentesis yesterday EXAM: CT CHEST, ABDOMEN, AND PELVIS WITH CONTRAST TECHNIQUE: Multidetector CT imaging of the chest, abdomen and pelvis was performed following the standard protocol during bolus administration of intravenous contrast. CONTRAST:  OMNIPAQUE IOHEXOL 300 MG/ML  SOLN COMPARISON:  CT abdomen pelvis, 10/21/2020, 1:09 a.m. FINDINGS: CT CHEST FINDINGS Cardiovascular: Aortic atherosclerosis. Right upper extremity PICC, tip near the superior cavoatrial junction. Normal heart size. No pericardial effusion. Mediastinum/Nodes: No enlarged mediastinal, hilar, or axillary lymph nodes. Thyroid is normal. Endotracheal and esophagogastric intubation. Lungs/Pleura: Large left, moderate right pleural effusions and associated atelectasis or consolidation. There is extensive, heterogeneous high density in the inferior aspect of the left pleural effusion, consistent with a large hematoma, given relatively circumscribed appearing appears to be extrapleural and measures approximately 15.3 x 12.1 x 8.5 cm (series 6, image 41). There appears to be a small focus of contrast extravasation adjacent to the inferior margin of the posterolateral left tenth rib (series 6, image 64). Musculoskeletal: No chest wall mass or suspicious bone lesions identified. CT ABDOMEN PELVIS FINDINGS Hepatobiliary: No solid liver abnormality is seen. Gallstones and sludge in the gallbladder. No gallbladder wall thickening, or biliary dilatation. Pancreas: Unremarkable. No pancreatic ductal dilatation or surrounding inflammatory changes. Spleen: Normal in size without significant abnormality. Adrenals/Urinary Tract: Adrenal glands are unremarkable. Multiple nonobstructive right renal calculi. No left-sided calculi, ureteral calculi, or  hydronephrosis. Small air-fluid level within the urinary bladder, similar to prior. Stomach/Bowel: Stomach is within normal limits. Appendix is not clearly visualized. No evidence of bowel wall thickening, distention, or inflammatory changes. Vascular/Lymphatic: Aortic atherosclerosis. No enlarged abdominal or pelvic lymph nodes. Reproductive: No mass or other abnormality. Other: Anasarca. Redemonstrated percutaneous pigtail drainage catheter positioned in a right retroperitoneal air and fluid collection, not significantly changed  compared to prior examination (series 3, image 79). Small volume fluid attenuation ascites in the low pelvis (series 3, image 105). Musculoskeletal: No acute or significant osseous findings. IMPRESSION: 1. There is a large left pleural effusion, which contains a circumscribed appearing region of heterogeneous high density inferiorly measuring approximately 15.3 x 12.1 x 8.5 cm. This is most consistent with a large extrapleural hematoma and there appears to be a small focus of contrast extravasation adjacent to the inferior margin of the posterolateral left tenth rib, most consistent with an intercostal artery extravasation following thoracentesis. This is not significantly changed compared to prior same day noncontrast examination. 2. Moderate right pleural effusion. 3. Redemonstrated percutaneous pigtail drainage catheter positioned in a right retroperitoneal air and fluid collection, not significantly changed compared to prior examination. 4. Small volume fluid attenuation ascites in the low pelvis. 5. Anasarca. 6. Other chronic and incidental findings as above. Aortic Atherosclerosis (ICD10-I70.0). Findings discussed by telephone with Dr. Barton FannyBecerra at 1:15 p.m., 10/21/2020. Electronically Signed   By: Lauralyn PrimesAlex  Bibbey M.D.   On: 10/21/2020 14:01   DG Chest Port 1 View  Result Date: 10/21/2020 CLINICAL DATA:  Respiratory failure EXAM: PORTABLE CHEST 1 VIEW COMPARISON:  None. FINDINGS: AE  moderate to large left-sided pleural effusion with underlying opacity is stable. The ETT is stable in good position. The OG tube terminates below today's film. No pneumothorax. No change in the cardiomediastinal silhouette. The right mid lung opacity seen previously has resolved. Mild opacity in the right base is probably atelectasis. The right PICC line terminates in the right atrium, unchanged. No other acute abnormalities. IMPRESSION: 1. Support apparatus as above. 2. Persistent moderate to large left effusion. 3. Resolution of right midlung opacity. 4. Mild opacity in the right base is probably atelectasis. Electronically Signed   By: Gerome Samavid  Williams III M.D   On: 10/21/2020 09:37   DG Chest Port 1 View  Result Date: 10/20/2020 CLINICAL DATA:  Post thoracentesis EXAM: PORTABLE CHEST 1 VIEW COMPARISON:  Radiograph 10/20/2020 FINDINGS: Endotracheal tube tip terminates 2.5 cm from the carina. Transesophageal tube tip and side port distal to the GE junction. Right upper extremity PICC terminates at the right atrium. Telemetry leads overlie the chest. Slight interval decrease in the size of a left pleural effusion with some lobular margins likely reflecting loculation. More coalescent retrocardiac opacity could reflect a combination of layering pleural fluid, atelectasis and/or airspace disease. Increasing coalescence of opacity seen in the right mid and lower lung with a trace right effusion as well. No visible pneumothorax. The aorta is calcified. The remaining cardiomediastinal contours are unremarkable. No acute osseous or soft tissue abnormality. Degenerative changes are present in the imaged spine and shoulders. IMPRESSION: 1. Slight interval decrease in size of a left pleural effusion with some lobular margins which could reflect loculation. 2. Slightly increased coalescent retrocardiac and right mid and lower lung opacities, could reflect a combination of layering pleural fluid, atelectasis and/or  airspace disease. 3. No pneumothorax. 4. Lines and tubes as above. 5.  Aortic Atherosclerosis (ICD10-I70.0). Electronically Signed   By: Kreg ShropshirePrice  DeHay M.D.   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  BP 123/79   Pulse 82   Temp (!) 97.5 F (36.4 C) (Axillary)   Resp 15   Wt 59.3 kg   SpO2  100%   BMI 20.48 kg/m   General: Cachectic male intubated mechanical life support HEENT: NG tube in place, attempts to track with sedation lightened Neuro: Sedate mechanical support Heart: Regular rhythm S1-S2 Lungs: Bilateral mechanically ventilated breath sounds  Labs: Reviewed  Assessment: Acute hypoxemic respiratory failure secondary to aspiration pneumonia, bilateral pleural effusion status post thoracentesis with left-sided Klebsiella empyema, complicated by a hemothorax with significant hemoglobin drop. 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 Monitor H&H. Transfusion threshold for hemoglobin less than 7 Continue to wean vasopressors to maintain mean arterial pressure greater than 65 mmHg. At this point had to stop antiplatelets due to bleeding Continued ASA Restart for TPN this evening after discussion with surgery and pharmacy at bedside rounds.  Updated patient's daughter at bedside today.  She was appreciative of the update.  This patient is critically ill with multiple organ  system failure; which, requires frequent high complexity decision making, assessment, support, evaluation, and titration of therapies. This was completed through the application of advanced monitoring technologies and extensive interpretation of multiple databases. During this encounter critical care time was devoted to patient care services described in this note for 32 minutes.  Josephine Igo, DO [facility] 10/17/2020 4:48 PM

## 2020-10-22 NOTE — Progress Notes (Signed)
PHARMACY - TOTAL PARENTERAL NUTRITION CONSULT NOTE  Indication:  Perforated duodenum  Patient Measurements: Weight: 59.3 kg (130 lb 11.7 oz)   Body mass index is 20.48 kg/m. Usual Weight: 120-125 lbs  Assessment:  64 YOM presented on October 17, 2020 with right MCA occlusion and underwent revascularization.  PMH significant for PUD post partial gastrectomy in 1990.  Patient was started on a dysphagia diet on 10/08/20 and also transferred out of the ICU.  Post-op course complicated by Afib RVR, aspiration PNA/cellulitis and AKI.  He was then started on TF on 10/12/20, which ended on 10/13/20 when CT showed perinephric abscess with gas formation.  Drain was placed and there is concern for fistulous communication to the colon on 10/16/20 CT.  Nutrition has been inadequate since admission and Pharmacy consulted for TPN management.  Per patient's son, patient has no recent weight loss and was eating a balanced diet PTA.  Patient transferred to ICU on 10/18/20 for increased WOB, lethargy, and tachycardia.    Glucose / Insulin: no hx DM - CBGs < 180s; currently on D10 since TPN held overnight Electrolytes: K 4.7 Mg 2 Renal: SCr up to 2.74 LFTs / TGs: AST 170, trigs 123 Prealbumin / albumin: albumin 1.3, baseline prealbumin 9.4>8.3 Intake / Output; MIVF: UOP 0.5 ml/kg/hr, drain , NGT 250 out GI Imaging:  11/20 CT - large IA fluid and blood in LUQ worrisome for active bleeding, renal cysts, cannot exclude fistulous communication with large bowel Surgeries / Procedures:  11/20 - intubated, thoracentesis -- drained cloudy, yellow appearing fluid 11/22 - plan for VATS today  Central access: PICC 10/17/20 TPN start date: 10/17/20  Nutritional Goals (per RD rec on 11/20): 1800-2000 kCal, 100-115gm protein per day  Current Nutrition:  TPN  Plan:  Resume TPN at 80 ml/hr, providing 109g AA, 326g CHO and 29g ILE for a total of 1836 kCal, meeting 100% of patient needs - will DC D10 when bag  hangs Electrolytes in TPN: 50 Na, K 80mEq/L, Ca 66mEq/L, Mag 42mEq/L, Phos 18mmol/L, max acetate - no change today Add standard MVI and trace elements to TPN Continue moderate SSI Q4H - remove insulin from TPN as CBG controlled on current D10 F/U AM labs, monitor CBGs and I/O's  Elmer Sow, PharmD, BCPS, BCCCP Clinical Pharmacist [phone]  Please check AMION for all [facility] numbers  10/07/2020 10:53 AM

## 2020-10-22 NOTE — Plan of Care (Signed)
  Problem: Education: Goal: Knowledge of General Education information will improve Description: Including pain rating scale, medication(s)/side effects and non-pharmacologic comfort measures Outcome: Progressing   Problem: Health Behavior/Discharge Planning: Goal: Ability to manage health-related needs will improve Outcome: Progressing   Problem: Clinical Measurements: Goal: Ability to maintain clinical measurements within normal limits will improve Outcome: Progressing Goal: Will remain free from infection Outcome: Progressing Goal: Diagnostic test results will improve Outcome: Progressing Goal: Respiratory complications will improve Outcome: Progressing Goal: Cardiovascular complication will be avoided Outcome: Progressing   Problem: Activity: Goal: Risk for activity intolerance will decrease Outcome: Progressing   Problem: Coping: Goal: Level of anxiety will decrease Outcome: Progressing   Problem: Safety: Goal: Ability to remain free from injury will improve Outcome: Progressing   Problem: Skin Integrity: Goal: Risk for impaired skin integrity will decrease Outcome: Progressing   Problem: Education: Goal: Knowledge of disease or condition will improve Outcome: Progressing Goal: Knowledge of secondary prevention will improve Outcome: Progressing Goal: Knowledge of patient specific risk factors addressed and post discharge goals established will improve Outcome: Progressing Goal: Individualized Educational Video(s) Outcome: Progressing   Problem: Coping: Goal: Will verbalize positive feelings about self Outcome: Progressing Goal: Will identify appropriate support needs Outcome: Progressing   Problem: Health Behavior/Discharge Planning: Goal: Ability to manage health-related needs will improve Outcome: Progressing   Problem: Self-Care: Goal: Ability to participate in self-care as condition permits will improve Outcome: Progressing Goal: Verbalization of  feelings and concerns over difficulty with self-care will improve Outcome: Progressing Goal: Ability to communicate needs accurately will improve Outcome: Progressing   Problem: Nutrition: Goal: Risk of aspiration will decrease Outcome: Progressing   Problem: Ischemic Stroke/TIA Tissue Perfusion: Goal: Complications of ischemic stroke/TIA will be minimized Outcome: Progressing   Problem: Fluid Volume: Goal: Hemodynamic stability will improve Outcome: Progressing   Problem: Clinical Measurements: Goal: Diagnostic test results will improve Outcome: Progressing Goal: Signs and symptoms of infection will decrease Outcome: Progressing   Problem: Respiratory: Goal: Ability to maintain adequate ventilation will improve Outcome: Progressing

## 2020-10-22 NOTE — Progress Notes (Signed)
PCCM:  I spoke with daughter at bedside for an update.  Josephine Igo, DO [facility] 10/06/2020 11:14 AM

## 2020-10-22 NOTE — Transfer of Care (Signed)
Immediate Anesthesia Transfer of Care Note  Patient: Mark May  Procedure(s) Performed: VIDEO ASSISTED THORACOSCOPY (VATS)/DECORTICATION (Left )  Patient Location: ICU  Anesthesia Type:General  Level of Consciousness: Patient remains intubated per anesthesia plan  Airway & Oxygen Therapy: Patient placed on Ventilator (see vital sign flow sheet for setting)  Post-op Assessment: Report given to RN and Post -op Vital signs reviewed and stable  Post vital signs: Reviewed  Last Vitals:  Vitals Value Taken Time  BP    Temp    Pulse    Resp    SpO2      Last Pain:  Vitals:   10/28/2020 1601  TempSrc: Axillary  PainSc:          Complications: No complications documented.

## 2020-10-22 NOTE — Progress Notes (Signed)
Progress Note  17 Days Post-Op  Subjective: Patient intubated and on pressors.  Daughter at bedside. VATS planned today by TCTS.  Objective: Vital signs in last 24 hours: Temp:  [96.6 F (35.9 C)-100.6 F (38.1 C)] 98.3 F (36.8 C) (11/22 0800) Pulse Rate:  [70-98] 79 (11/22 1015) Resp:  [13-16] 15 (11/22 1015) BP: (85-168)/(57-106) 89/62 (11/22 1015) SpO2:  [92 %-100 %] 96 % (11/22 1015) FiO2 (%):  [30 %] 30 % (11/22 0850) Weight:  [59.3 kg] 59.3 kg (11/22 0100) Last BM Date: 10/20/20  Intake/Output from previous day: 11/21 0701 - 11/22 0700 In: 3903.4 [I.V.:2323.4; Blood:1237.3; IV Piggyback:342.6] Out: 1370 [Urine:730; Emesis/NG output:240; Drains:400] Intake/Output this shift: Total I/O In: 569.8 [I.V.:541.2; IV Piggyback:28.6] Out: 150 [Urine:150]  PE: General: pleasant, WD, cachectic male who is laying in bed intubated and sedated Heart: regular, rate, and rhythm. Palpable radial and pedal pulses bilaterally Lungs: on the vent, diminished lung sounds in L base Abd: soft, ND, NGT with bilious drainage, drain present with greenish yellow fluid     Lab Results:  Recent Labs    10/09/2020 0446 10/10/2020 0948  WBC 18.5* 20.1*  HGB 8.8* 9.4*  HCT 26.9* 29.1*  PLT 166 207   BMET Recent Labs    10/21/20 1737 10/28/2020 0446  NA 144 144  K 5.6* 4.7  CL 118* 120*  CO2 17* 16*  GLUCOSE 209* 116*  BUN 66* 61*  CREATININE 2.62* 2.74*  CALCIUM 7.3* 6.8*   PT/INR Recent Labs    10/21/20 0139 10/01/2020 0446  LABPROT 19.6* 15.5*  INR 1.7* 1.3*   CMP     Component Value Date/Time   NA 144 10/28/2020 0446   NA 140 03/09/2018 1620   NA 136 01/06/2015 1448   K 4.7 10/28/2020 0446   K 3.6 01/06/2015 1448   CL 120 (H) 10/03/2020 0446   CL 99 01/06/2015 1448   CO2 16 (L) 10/26/2020 0446   CO2 28 01/06/2015 1448   GLUCOSE 116 (H) 10/14/2020 0446   GLUCOSE 154 (H) 01/06/2015 1448   BUN 61 (H) 10/14/2020 0446   BUN 12 03/09/2018 1620   BUN 12  01/06/2015 1448   CREATININE 2.74 (H) 10/01/2020 0446   CREATININE 0.88 01/06/2015 1448   CALCIUM 6.8 (L) 10/10/2020 0446   CALCIUM 8.7 01/06/2015 1448   PROT 4.4 (L) 10/16/2020 0446   PROT 7.4 03/09/2018 1620   PROT 8.0 01/06/2015 1448   ALBUMIN 1.3 (L) 10/19/2020 0446   ALBUMIN 4.8 03/09/2018 1620   ALBUMIN 4.2 01/06/2015 1448   AST 170 (H) 10/07/2020 0446   AST 32 01/06/2015 1448   ALT 99 (H) 10/08/2020 0446   ALT 44 01/06/2015 1448   ALKPHOS 67 10/06/2020 0446   ALKPHOS 123 (H) 01/06/2015 1448   BILITOT 1.7 (H) 10/10/2020 0446   BILITOT 0.6 03/09/2018 1620   BILITOT 0.3 01/06/2015 1448   GFRNONAA 25 (L) 10/23/2020 0446   GFRNONAA >60 01/06/2015 1448   GFRAA 113 03/09/2018 1620   GFRAA >60 01/06/2015 1448   Lipase  No results found for: LIPASE     Studies/Results: CT ABDOMEN PELVIS WO CONTRAST  Result Date: 10/21/2020 CLINICAL DATA:  Abdominal pain and abdominal distension. EXAM: CT ABDOMEN AND PELVIS WITHOUT CONTRAST TECHNIQUE: Multidetector CT imaging of the abdomen and pelvis was performed following the standard protocol without IV contrast. COMPARISON:  October 16, 2020 FINDINGS: Lower chest: Moderate severity areas of consolidation are seen within the bilateral lung bases. There is a  large left pleural effusion with suspected loculated components. This is increased in size when compared to the prior study. A small to moderate sized right pleural effusion is also noted. Hepatobiliary: No focal liver abnormality is seen. Numerous subcentimeter gallstones are seen within the lumen of a normal appearing gallbladder. There is no evidence of biliary dilatation. Pancreas: Unremarkable. No pancreatic ductal dilatation or surrounding inflammatory changes. Spleen: The spleen is displaced within the anteromedial aspect of the left upper quadrant the. A 15.5 cm x 11.3 cm x 16.9 cm area containing a mixture of hemorrhagic and non hemorrhagic fluid is seen within the left upper  quadrant. This appears to be retroperitoneal in location and represents a new finding when compared to the prior exam. Adrenals/Urinary Tract: Adrenal glands are unremarkable. Kidneys are normal in size. Multiple stable bilateral renal cysts of various sizes are seen. Multiple 2 mm, 3 mm and 4 mm nonobstructing renal stones are seen throughout the right kidney. A mild amount of air is again seen within the lumen of the urinary bladder. Stomach/Bowel: A nasogastric tube is seen with its distal tip noted within the body of the stomach. The appendix is not clearly identified. No evidence of bowel dilatation. A 2.1 cm x 1.3 cm area of oral contrast is seen adjacent to the posteromedial aspect of the cecum (axial CT image 69, CT series number 3). This is present on the prior study. Vascular/Lymphatic: There is moderate severity calcification of the abdominal aorta and bilateral common iliac arteries, without evidence of aneurysmal dilatation. No enlarged abdominal or pelvic lymph nodes. Reproductive: Prostate is unremarkable. Other: A percutaneous drainage catheter is again seen entering via the right flank. Its distal tip is noted along the posterior aspect of the right lower quadrant and is unchanged in position when compared to the prior study. Numerous foci of moderate severity contained free air are again seen within this region. Persistent surrounding mesenteric inflammatory fat stranding and mesenteric fluid is also noted. A small amount of posterior pelvic free fluid is seen. This is stable in appearance when compared to the prior study. Musculoskeletal: A moderate to marked amount of para muscular subcutaneous inflammatory fat stranding is seen along the lateral aspects of the left abdominal and pelvic walls. No acute or significant osseous findings. IMPRESSION: 1. Very large area of intra-abdominal fluid and blood within the left upper quadrant, with an appearance worrisome for active bleeding. 2. Large left  pleural effusion with a small to moderate sized right pleural effusion. 3. Moderate severity bibasilar consolidation which may represent atelectasis and/or pneumonia. 4. Cholelithiasis. 5. Multiple stable bilateral renal cysts of various sizes. 6. Stable position of the right-sided percutaneous drainage catheter with a moderate amount of surrounding contained free air, fluid and inflammatory fat stranding. Fistulous communication with the adjacent portion of large bowel cannot be excluded. 7. Moderate to marked amount of para muscular subcutaneous inflammatory fat stranding along the lateral aspects of the left abdominal and pelvic walls. 8. Moderate to marked amount of para muscular. 9. Aortic atherosclerosis. Aortic Atherosclerosis (ICD10-I70.0). Electronically Signed   By: Aram Candela M.D.   On: 10/21/2020 01:53   CT CHEST W CONTRAST  Result Date: 10/21/2020 CLINICAL DATA:  Dropping hematocrit, large hemothorax following thoracentesis yesterday EXAM: CT CHEST, ABDOMEN, AND PELVIS WITH CONTRAST TECHNIQUE: Multidetector CT imaging of the chest, abdomen and pelvis was performed following the standard protocol during bolus administration of intravenous contrast. CONTRAST:  OMNIPAQUE IOHEXOL 300 MG/ML  SOLN COMPARISON:  CT abdomen pelvis,  10/21/2020, 1:09 a.m. FINDINGS: CT CHEST FINDINGS Cardiovascular: Aortic atherosclerosis. Right upper extremity PICC, tip near the superior cavoatrial junction. Normal heart size. No pericardial effusion. Mediastinum/Nodes: No enlarged mediastinal, hilar, or axillary lymph nodes. Thyroid is normal. Endotracheal and esophagogastric intubation. Lungs/Pleura: Large left, moderate right pleural effusions and associated atelectasis or consolidation. There is extensive, heterogeneous high density in the inferior aspect of the left pleural effusion, consistent with a large hematoma, given relatively circumscribed appearing appears to be extrapleural and measures  approximately 15.3 x 12.1 x 8.5 cm (series 6, image 41). There appears to be a small focus of contrast extravasation adjacent to the inferior margin of the posterolateral left tenth rib (series 6, image 64). Musculoskeletal: No chest wall mass or suspicious bone lesions identified. CT ABDOMEN PELVIS FINDINGS Hepatobiliary: No solid liver abnormality is seen. Gallstones and sludge in the gallbladder. No gallbladder wall thickening, or biliary dilatation. Pancreas: Unremarkable. No pancreatic ductal dilatation or surrounding inflammatory changes. Spleen: Normal in size without significant abnormality. Adrenals/Urinary Tract: Adrenal glands are unremarkable. Multiple nonobstructive right renal calculi. No left-sided calculi, ureteral calculi, or hydronephrosis. Small air-fluid level within the urinary bladder, similar to prior. Stomach/Bowel: Stomach is within normal limits. Appendix is not clearly visualized. No evidence of bowel wall thickening, distention, or inflammatory changes. Vascular/Lymphatic: Aortic atherosclerosis. No enlarged abdominal or pelvic lymph nodes. Reproductive: No mass or other abnormality. Other: Anasarca. Redemonstrated percutaneous pigtail drainage catheter positioned in a right retroperitoneal air and fluid collection, not significantly changed compared to prior examination (series 3, image 79). Small volume fluid attenuation ascites in the low pelvis (series 3, image 105). Musculoskeletal: No acute or significant osseous findings. IMPRESSION: 1. There is a large left pleural effusion, which contains a circumscribed appearing region of heterogeneous high density inferiorly measuring approximately 15.3 x 12.1 x 8.5 cm. This is most consistent with a large extrapleural hematoma and there appears to be a small focus of contrast extravasation adjacent to the inferior margin of the posterolateral left tenth rib, most consistent with an intercostal artery extravasation following thoracentesis.  This is not significantly changed compared to prior same day noncontrast examination. 2. Moderate right pleural effusion. 3. Redemonstrated percutaneous pigtail drainage catheter positioned in a right retroperitoneal air and fluid collection, not significantly changed compared to prior examination. 4. Small volume fluid attenuation ascites in the low pelvis. 5. Anasarca. 6. Other chronic and incidental findings as above. Aortic Atherosclerosis (ICD10-I70.0). Findings discussed by telephone with Dr. Barton Fanny at 1:15 p.m., 10/21/2020. Electronically Signed   By: Lauralyn Primes M.D.   On: 10/21/2020 14:01   CT ABDOMEN PELVIS W CONTRAST  Result Date: 10/21/2020 CLINICAL DATA:  Dropping hematocrit, large hemothorax following thoracentesis yesterday EXAM: CT CHEST, ABDOMEN, AND PELVIS WITH CONTRAST TECHNIQUE: Multidetector CT imaging of the chest, abdomen and pelvis was performed following the standard protocol during bolus administration of intravenous contrast. CONTRAST:  OMNIPAQUE IOHEXOL 300 MG/ML  SOLN COMPARISON:  CT abdomen pelvis, 10/21/2020, 1:09 a.m. FINDINGS: CT CHEST FINDINGS Cardiovascular: Aortic atherosclerosis. Right upper extremity PICC, tip near the superior cavoatrial junction. Normal heart size. No pericardial effusion. Mediastinum/Nodes: No enlarged mediastinal, hilar, or axillary lymph nodes. Thyroid is normal. Endotracheal and esophagogastric intubation. Lungs/Pleura: Large left, moderate right pleural effusions and associated atelectasis or consolidation. There is extensive, heterogeneous high density in the inferior aspect of the left pleural effusion, consistent with a large hematoma, given relatively circumscribed appearing appears to be extrapleural and measures approximately 15.3 x 12.1 x 8.5 cm (series 6, image 41).  There appears to be a small focus of contrast extravasation adjacent to the inferior margin of the posterolateral left tenth rib (series 6, image 64). Musculoskeletal: No  chest wall mass or suspicious bone lesions identified. CT ABDOMEN PELVIS FINDINGS Hepatobiliary: No solid liver abnormality is seen. Gallstones and sludge in the gallbladder. No gallbladder wall thickening, or biliary dilatation. Pancreas: Unremarkable. No pancreatic ductal dilatation or surrounding inflammatory changes. Spleen: Normal in size without significant abnormality. Adrenals/Urinary Tract: Adrenal glands are unremarkable. Multiple nonobstructive right renal calculi. No left-sided calculi, ureteral calculi, or hydronephrosis. Small air-fluid level within the urinary bladder, similar to prior. Stomach/Bowel: Stomach is within normal limits. Appendix is not clearly visualized. No evidence of bowel wall thickening, distention, or inflammatory changes. Vascular/Lymphatic: Aortic atherosclerosis. No enlarged abdominal or pelvic lymph nodes. Reproductive: No mass or other abnormality. Other: Anasarca. Redemonstrated percutaneous pigtail drainage catheter positioned in a right retroperitoneal air and fluid collection, not significantly changed compared to prior examination (series 3, image 79). Small volume fluid attenuation ascites in the low pelvis (series 3, image 105). Musculoskeletal: No acute or significant osseous findings. IMPRESSION: 1. There is a large left pleural effusion, which contains a circumscribed appearing region of heterogeneous high density inferiorly measuring approximately 15.3 x 12.1 x 8.5 cm. This is most consistent with a large extrapleural hematoma and there appears to be a small focus of contrast extravasation adjacent to the inferior margin of the posterolateral left tenth rib, most consistent with an intercostal artery extravasation following thoracentesis. This is not significantly changed compared to prior same day noncontrast examination. 2. Moderate right pleural effusion. 3. Redemonstrated percutaneous pigtail drainage catheter positioned in a right retroperitoneal air and fluid  collection, not significantly changed compared to prior examination. 4. Small volume fluid attenuation ascites in the low pelvis. 5. Anasarca. 6. Other chronic and incidental findings as above. Aortic Atherosclerosis (ICD10-I70.0). Findings discussed by telephone with Dr. Barton Fanny at 1:15 p.m., 10/21/2020. Electronically Signed   By: Lauralyn Primes M.D.   On: 10/21/2020 14:01   DG Chest Port 1 View  Result Date: 10/21/2020 CLINICAL DATA:  Respiratory failure EXAM: PORTABLE CHEST 1 VIEW COMPARISON:  None. FINDINGS: AE moderate to large left-sided pleural effusion with underlying opacity is stable. The ETT is stable in good position. The OG tube terminates below today's film. No pneumothorax. No change in the cardiomediastinal silhouette. The right mid lung opacity seen previously has resolved. Mild opacity in the right base is probably atelectasis. The right PICC line terminates in the right atrium, unchanged. No other acute abnormalities. IMPRESSION: 1. Support apparatus as above. 2. Persistent moderate to large left effusion. 3. Resolution of right midlung opacity. 4. Mild opacity in the right base is probably atelectasis. Electronically Signed   By: Gerome Sam III M.D   On: 10/21/2020 09:37   DG Chest Port 1 View  Result Date: 10/20/2020 CLINICAL DATA:  Post thoracentesis EXAM: PORTABLE CHEST 1 VIEW COMPARISON:  Radiograph 10/20/2020 FINDINGS: Endotracheal tube tip terminates 2.5 cm from the carina. Transesophageal tube tip and side port distal to the GE junction. Right upper extremity PICC terminates at the right atrium. Telemetry leads overlie the chest. Slight interval decrease in the size of a left pleural effusion with some lobular margins likely reflecting loculation. More coalescent retrocardiac opacity could reflect a combination of layering pleural fluid, atelectasis and/or airspace disease. Increasing coalescence of opacity seen in the right mid and lower lung with a trace right effusion as  well. No visible pneumothorax. The aorta is  calcified. The remaining cardiomediastinal contours are unremarkable. No acute osseous or soft tissue abnormality. Degenerative changes are present in the imaged spine and shoulders. IMPRESSION: 1. Slight interval decrease in size of a left pleural effusion with some lobular margins which could reflect loculation. 2. Slightly increased coalescent retrocardiac and right mid and lower lung opacities, could reflect a combination of layering pleural fluid, atelectasis and/or airspace disease. 3. No pneumothorax. 4. Lines and tubes as above. 5.  Aortic Atherosclerosis (ICD10-I70.0). Electronically Signed   By: Kreg Shropshire M.D.   On: 10/20/2020 19:50    Anti-infectives: Anti-infectives (From admission, onward)   Start     Dose/Rate Route Frequency Ordered Stop   10/19/20 1400  piperacillin-tazobactam (ZOSYN) IVPB 3.375 g        3.375 g 12.5 mL/hr over 240 Minutes Intravenous Every 8 hours 10/19/20 1150     10/19/20 0200  vancomycin (VANCOREADY) IVPB 500 mg/100 mL  Status:  Discontinued        500 mg 100 mL/hr over 60 Minutes Intravenous Every 12 hours 10/19/20 0025 10/19/20 0951   10/19/20 0200  metroNIDAZOLE (FLAGYL) IVPB 500 mg  Status:  Discontinued        500 mg 100 mL/hr over 60 Minutes Intravenous Every 8 hours 10/19/20 0045 10/19/20 1150   10/19/20 0115  ceFEPIme (MAXIPIME) 2 g in sodium chloride 0.9 % 100 mL IVPB  Status:  Discontinued        2 g 200 mL/hr over 30 Minutes Intravenous Every 12 hours 10/19/20 0025 10/19/20 1150   10/17/20 1400  Ampicillin-Sulbactam (UNASYN) 3 g in sodium chloride 0.9 % 100 mL IVPB  Status:  Discontinued        3 g 200 mL/hr over 30 Minutes Intravenous Every 6 hours 10/17/20 1207 10/18/20 2325   10/17/20 1400  fluconazole (DIFLUCAN) IVPB 400 mg        400 mg 100 mL/hr over 120 Minutes Intravenous Every 24 hours 10/17/20 1207     10/15/20 1630  vancomycin (VANCOREADY) IVPB 500 mg/100 mL  Status:  Discontinued         500 mg 100 mL/hr over 60 Minutes Intravenous Every 12 hours 10/15/20 0940 10/17/20 1205   10/14/20 0400  vancomycin (VANCOREADY) IVPB 500 mg/100 mL  Status:  Discontinued        500 mg 100 mL/hr over 60 Minutes Intravenous Every 24 hours 10/13/20 0233 10/15/20 0940   10/13/20 0330  vancomycin (VANCOREADY) IVPB 750 mg/150 mL        750 mg 150 mL/hr over 60 Minutes Intravenous  Once 10/13/20 0233 10/13/20 0439   10/12/20 1800  ceFEPIme (MAXIPIME) 2 g in sodium chloride 0.9 % 100 mL IVPB  Status:  Discontinued        2 g 200 mL/hr over 30 Minutes Intravenous Every 24 hours 10/11/20 1718 10/11/20 1742   10/12/20 1800  vancomycin (VANCOREADY) IVPB 750 mg/150 mL  Status:  Discontinued        750 mg 150 mL/hr over 60 Minutes Intravenous Every 24 hours 10/11/20 1724 10/11/20 1758   10/12/20 0200  metroNIDAZOLE (FLAGYL) tablet 500 mg  Status:  Discontinued        500 mg Oral Every 8 hours 10/11/20 1715 10/11/20 1758   10/12/20 0030  piperacillin-tazobactam (ZOSYN) IVPB 3.375 g  Status:  Discontinued        3.375 g 12.5 mL/hr over 240 Minutes Intravenous Every 8 hours 10/11/20 1805 10/17/20 1205   10/11/20 1830  piperacillin-tazobactam (ZOSYN)  IVPB 3.375 g        3.375 g 100 mL/hr over 30 Minutes Intravenous  Once 10/11/20 1805 10/11/20 1839   10/11/20 1715  ceFEPIme (MAXIPIME) 2 g in sodium chloride 0.9 % 100 mL IVPB  Status:  Discontinued        2 g 200 mL/hr over 30 Minutes Intravenous STAT 10/11/20 1710 10/11/20 1742   10/11/20 1715  metroNIDAZOLE (FLAGYL) IVPB 500 mg  Status:  Discontinued        500 mg 100 mL/hr over 60 Minutes Intravenous STAT 10/11/20 1710 10/11/20 1807   10/11/20 1715  vancomycin (VANCOCIN) IVPB 1000 mg/200 mL premix  Status:  Discontinued        1,000 mg 200 mL/hr over 60 Minutes Intravenous STAT 10/11/20 1710 10/11/20 1804   10/19/2020 1541  ceFAZolin (ANCEF) 2-4 GM/100ML-% IVPB       Note to Pharmacy: [facility]   : cabinet override      10/03/2020 1541 10/06/20  0344       Assessment/Plan RLL PNA Metabolic encephalopathy  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  L hemiplegia  Dysphagia  Paroxysmal atrial fibrillation Protein calorie malnutrition - prealbumin 8.2 this AM, TPN intermittent stopped over the weekend for various reasons, would favor treatment of any electrolyte imbalances and not holding TPN whenever possible  Hx of PUD s/p partial gastrectomy 1990 in [place]  Contained bowel perforation with intra-abdominal abscess - s/p IR drain placement 11/14 - output appears bilious, growing klebsiella  - think that duodenum is most likely site of perforation based on earlier CT and drain output/cxs - recommend NGT to LIWS, NPO and TPN - hopefully with TPN and bowel rest, patient may be able to heal site of perforation on his own. Will plan UGI vs CT with PO contrast likely this week if patient stable from a pulmonary standpoint to evaluate for persistent leak. At that time if patient appears to have leak will need to consider continuing TPN and NPO vs palliative consult vs surgical intervention understanding that patient is a high risk surgical candidate given recent stroke  New L hemothorax - STAT CTA Chest/Abdomen/Pelvis yesterday with L hemothorax - TCTS consulted yesterday for L hemothorax and planning VATS today   FEN: NPO, IVF, PICC and TPN  VTE: SCDs, ASA suppository (on hold last 2 days for bleeding) ID: [email or web address]  LOS: 17 days    Juliet Rude , [facility] 10/17/2020, 11:27 AM Please see Amion for pager number during day hours 7:00am-4:30pm

## 2020-10-22 NOTE — Progress Notes (Signed)
     [address]       [place] [address]             [phone]       Stable overnight Vitals:   10/01/2020 0530 10/17/2020 0545  BP: 98/65 105/64  Pulse: 76 78  Resp: 15 15  Temp:    SpO2: 98% 99%   arousable Vent Mode: PRVC FiO2 (%):  [30 %-40 %] 30 % Set Rate:  [15 bmp] 15 bmp Vt Set:  [520 mL] 520 mL PEEP:  [5 cmH20] 5 cmH20 Plateau Pressure:  [23 cmH20-27 cmH20] 24 cmH20 Sinus  OR today for L VATS decortication.  Minami Arriaga Keane Scrape

## 2020-10-22 NOTE — Progress Notes (Signed)
Pharmacy Antibiotic Note  Mark May is a 64 y.o. male admitted on 10/15/2020 with stroke.  Pharmacy has been consulted for Zosyn and fluconazole dosing for HCAP coverage and abdominal abscess in setting of bowel perf. Renal function worsening. Tmax 100.6, WBC 20.1.  Plan: Zosyn 3.375g IV every 8 hours Decrease fluconazole to 200mg  IV every 24 hours Monitor C&S, renal function, and clinical status  Weight: 59.3 kg (130 lb 11.7 oz)  Temp (24hrs), Avg:99.5 F (37.5 C), Min:98.3 F (36.8 C), Max:100.6 F (38.1 C)  Recent Labs  Lab 10/18/20 0726 10/18/20 0727 10/18/20 2258 10/19/20 0032 10/20/20 10/06/2020 10/21/20 0839 10/21/20 1737 10/21/20 2100 10/24/20 0232 10/24/2020 0446 Oct 24, 2020 0948  WBC  --    < >  --   --   --  20.8*  --  17.5* 19.6* 18.5* 20.1*  CREATININE 1.07  --   --  1.03 1.20  --  2.62*  --   --  2.74*  --   LATICACIDVEN  --   --  1.6 1.5  --   --   --   --   --   --   --    < > = values in this interval not displayed.    Estimated Creatinine Clearance: 22.5 mL/min (A) (by C-G formula based on SCr of 2.74 mg/dL (H)).    No Known Allergies  10/24/20, PharmD PGY1 Acute Care Pharmacy Resident Phone: [phone] October 24, 2020 11:44 AM  Please check [email or web address] for unit specific pharmacy phone numbers.

## 2020-10-23 ENCOUNTER — Inpatient Hospital Stay: Payer: Self-pay

## 2020-10-23 ENCOUNTER — Inpatient Hospital Stay (HOSPITAL_COMMUNITY): Payer: 59

## 2020-10-23 ENCOUNTER — Encounter (HOSPITAL_COMMUNITY): Payer: Self-pay | Admitting: Thoracic Surgery (Cardiothoracic Vascular Surgery)

## 2020-10-23 DIAGNOSIS — N179 Acute kidney failure, unspecified: Secondary | ICD-10-CM | POA: Diagnosis not present

## 2020-10-23 DIAGNOSIS — I6601 Occlusion and stenosis of right middle cerebral artery: Secondary | ICD-10-CM | POA: Diagnosis not present

## 2020-10-23 LAB — URINALYSIS, COMPLETE (UACMP) WITH MICROSCOPIC
Bilirubin Urine: NEGATIVE
Glucose, UA: NEGATIVE mg/dL
Ketones, ur: NEGATIVE mg/dL
Leukocytes,Ua: NEGATIVE
Nitrite: NEGATIVE
Protein, ur: NEGATIVE mg/dL
Specific Gravity, Urine: 1.01 (ref 1.005–1.030)
pH: 5 (ref 5.0–8.0)

## 2020-10-23 LAB — GLUCOSE, CAPILLARY
Glucose-Capillary: 100 mg/dL — ABNORMAL HIGH (ref 70–99)
Glucose-Capillary: 191 mg/dL — ABNORMAL HIGH (ref 70–99)
Glucose-Capillary: 198 mg/dL — ABNORMAL HIGH (ref 70–99)
Glucose-Capillary: 204 mg/dL — ABNORMAL HIGH (ref 70–99)
Glucose-Capillary: 308 mg/dL — ABNORMAL HIGH (ref 70–99)
Glucose-Capillary: 314 mg/dL — ABNORMAL HIGH (ref 70–99)

## 2020-10-23 LAB — CBC
HCT: 33.2 % — ABNORMAL LOW (ref 39.0–52.0)
Hemoglobin: 10.6 g/dL — ABNORMAL LOW (ref 13.0–17.0)
MCH: 28 pg (ref 26.0–34.0)
MCHC: 31.9 g/dL (ref 30.0–36.0)
MCV: 87.6 fL (ref 80.0–100.0)
Platelets: 182 10*3/uL (ref 150–400)
RBC: 3.79 MIL/uL — ABNORMAL LOW (ref 4.22–5.81)
RDW: 18.6 % — ABNORMAL HIGH (ref 11.5–15.5)
WBC: 16.9 10*3/uL — ABNORMAL HIGH (ref 4.0–10.5)
nRBC: 0.6 % — ABNORMAL HIGH (ref 0.0–0.2)

## 2020-10-23 LAB — BASIC METABOLIC PANEL
Anion gap: 8 (ref 5–15)
BUN: 64 mg/dL — ABNORMAL HIGH (ref 8–23)
CO2: 16 mmol/L — ABNORMAL LOW (ref 22–32)
Calcium: 7 mg/dL — ABNORMAL LOW (ref 8.9–10.3)
Chloride: 115 mmol/L — ABNORMAL HIGH (ref 98–111)
Creatinine, Ser: 2.96 mg/dL — ABNORMAL HIGH (ref 0.61–1.24)
GFR, Estimated: 23 mL/min — ABNORMAL LOW (ref >60)
Glucose, Bld: 225 mg/dL — ABNORMAL HIGH (ref 70–99)
Potassium: 5.3 mmol/L — ABNORMAL HIGH (ref 3.5–5.1)
Sodium: 139 mmol/L (ref 135–145)

## 2020-10-23 LAB — PROTEIN / CREATININE RATIO, URINE
Creatinine, Urine: 19.98 mg/dL
Protein Creatinine Ratio: 2.05 mg/mg{creat} — ABNORMAL HIGH (ref 0.00–0.15)
Total Protein, Urine: 41 mg/dL

## 2020-10-23 LAB — PHOSPHORUS: Phosphorus: 6.3 mg/dL — ABNORMAL HIGH (ref 2.5–4.6)

## 2020-10-23 MED ORDER — TRAVASOL 10 % IV SOLN
INTRAVENOUS | Status: AC
  Filled 2020-10-23: qty 1094.4

## 2020-10-23 MED ORDER — SODIUM BICARBONATE 8.4 % IV SOLN
50.0000 meq | Freq: Once | INTRAVENOUS | Status: AC
  Administered 2020-10-23: 50 meq via INTRAVENOUS
  Filled 2020-10-23: qty 50

## 2020-10-23 MED ORDER — PHENYLEPHRINE 40 MCG/ML (10ML) SYRINGE FOR IV PUSH (FOR BLOOD PRESSURE SUPPORT)
PREFILLED_SYRINGE | INTRAVENOUS | Status: AC
  Filled 2020-10-23: qty 10

## 2020-10-23 MED ORDER — NOREPINEPHRINE 4 MG/250ML-% IV SOLN: 0.0000 ug/min | INTRAVENOUS | Status: DC

## 2020-10-23 MED ORDER — FUROSEMIDE 10 MG/ML IJ SOLN
40.0000 mg | Freq: Four times a day (QID) | INTRAMUSCULAR | Status: DC
  Administered 2020-10-23: 40 mg via INTRAVENOUS
  Filled 2020-10-23: qty 4

## 2020-10-23 MED ORDER — DEXMEDETOMIDINE HCL IN NACL 400 MCG/100ML IV SOLN
0.4000 ug/kg/h | INTRAVENOUS | Status: DC
  Administered 2020-10-23: 0.6 ug/kg/h via INTRAVENOUS
  Administered 2020-10-23 – 2020-10-25 (×4): 0.4 ug/kg/h via INTRAVENOUS
  Filled 2020-10-23 (×5): qty 100

## 2020-10-23 MED ORDER — INSULIN ASPART 100 UNIT/ML ~~LOC~~ SOLN
0.0000 [IU] | SUBCUTANEOUS | Status: DC
  Administered 2020-10-23: 4 [IU] via SUBCUTANEOUS
  Administered 2020-10-23: 15 [IU] via SUBCUTANEOUS
  Administered 2020-10-24: 11 [IU] via SUBCUTANEOUS
  Administered 2020-10-24: 15 [IU] via SUBCUTANEOUS

## 2020-10-23 MED ORDER — NOREPINEPHRINE 4 MG/250ML-% IV SOLN
INTRAVENOUS | Status: AC
  Administered 2020-10-23: 5 mg
  Filled 2020-10-23: qty 250

## 2020-10-23 MED ORDER — NOREPINEPHRINE 16 MG/250ML-% IV SOLN
0.0000 ug/min | INTRAVENOUS | Status: DC
  Filled 2020-10-23: qty 250

## 2020-10-23 MED ORDER — METOLAZONE 5 MG PO TABS: 2.5000 mg | ORAL_TABLET | Freq: Once | ORAL | Status: DC

## 2020-10-23 MED ORDER — VASOPRESSIN 20 UNITS/100 ML INFUSION FOR SHOCK
0.0000 [IU]/min | INTRAVENOUS | Status: DC
  Administered 2020-10-23 – 2020-10-24 (×4): 0.03 [IU]/min via INTRAVENOUS
  Filled 2020-10-23 (×4): qty 100

## 2020-10-23 NOTE — Progress Notes (Addendum)
T[facility] DAILY ICU PROGRESS NOTE                   [address]            [place] [address]          [phone]   1 Day Post-Op Procedure(s) (LRB): VIDEO ASSISTED THORACOSCOPY (VATS)/DECORTICATION (Left)  Total Length of Stay:  LOS: 18 days   Subjective: Patient sedated, on vent. Relative at bedside.  Objective: Vital signs in last 24 hours: Temp:  [97.5 F (36.4 C)-100.1 F (37.8 C)] 98.2 F (36.8 C) (11/23 0349) Pulse Rate:  [61-104] 86 (11/23 0845) Cardiac Rhythm: Normal sinus rhythm (11/23 0800) Resp:  [14-18] 15 (11/23 0845) BP: (81-157)/(45-93) 107/45 (11/23 0746) SpO2:  [94 %-100 %] 100 % (11/23 0845) FiO2 (%):  [30 %] 30 % (11/23 0800)  Filed Weights   10/20/20 0500 10/21/20 0424 10/28/2020 0100  Weight: 56.1 kg 56.1 kg 59.3 kg      Intake/Output from previous day: 11/22 0701 - 11/23 0700 In: 5422.7 [I.V.:4766.2; Blood:315; IV Piggyback:341.5] Out: 2435 [Urine:1425; Drains:130; Blood:50; Chest Tube:830]  Intake/Output this shift: Total I/O In: 127.4 [I.V.:115; IV Piggyback:12.4] Out: -   Current Meds: Scheduled Meds: .  stroke: mapping our early stages of recovery book   Does not apply Once  . aspirin  300 mg Rectal Daily  . chlorhexidine gluconate (MEDLINE KIT)  15 mL Mouth Rinse BID  . Chlorhexidine Gluconate Cloth  6 each Topical Daily  . insulin aspart  0-20 Units Subcutaneous Q4H  . mouth rinse  15 mL Mouth Rinse 10 times per day  . pantoprazole (PROTONIX) IV  40 mg Intravenous Q12H  . sodium chloride flush  10-40 mL Intracatheter Q12H  . sodium chloride flush  5 mL Intracatheter Q8H   Continuous Infusions: . sodium chloride Stopped (10/20/2020 1300)  . fentaNYL infusion INTRAVENOUS 200 mcg/hr (10/23/20 0801)  . fluconazole (DIFLUCAN) IV    . phenylephrine (NEO-SYNEPHRINE) Adult infusion 80 mcg/min (10/15/2020 2103)  . piperacillin-tazobactam (ZOSYN)  IV 12.5 mL/hr at 10/23/20 0800  . TPN ADULT (ION) 80 mL/hr at 10/17/2020 1738    PRN Meds:.sodium chloride, acetaminophen, fentaNYL (SUBLIMAZE) injection, sodium chloride flush  General appearance: no distress Heart: RRR Lungs: Mostly clear bilaterally Abdomen: Soft,  sporadic bowel sounds Extremities: SCDs in place Wound: Clean and dry Chest tube: Bloody drainage, to suction, no air leak  Lab Results: CBC: Recent Labs    10/11/2020 2020 10/23/20 0536  WBC 19.8* 16.9*  HGB 10.1* 10.6*  HCT 31.0* 33.2*  PLT 155 182   BMET:  Recent Labs    10/02/2020 0446 10/23/20 0536  NA 144 139  K 4.7 5.3*  CL 120* 115*  CO2 16* 16*  GLUCOSE 116* 225*  BUN 61* 64*  CREATININE 2.74* 2.96*  CALCIUM 6.8* 7.0*    CMET: Lab Results  Component Value Date   WBC 16.9 (H) 10/23/2020   HGB 10.6 (L) 10/23/2020   HCT 33.2 (L) 10/23/2020   PLT 182 10/23/2020   GLUCOSE 225 (H) 10/23/2020   CHOL 173 10/06/2020   TRIG 123 10/02/2020   HDL 33 (L) 10/06/2020   LDLCALC 85 10/06/2020   ALT 99 (H) 10/15/2020   AST 170 (H) 10/17/2020   NA 139 10/23/2020   K 5.3 (H) 10/23/2020   CL 115 (H) 10/23/2020   CREATININE 2.96 (H) 10/23/2020   BUN 64 (H) 10/23/2020   CO2 16 (L) 10/23/2020   INR 1.3 (  H) 10/14/2020   HGBA1C 5.8 (H) 10/06/2020     PT/INR:  Recent Labs    10/21/2020 0446  LABPROT 15.5*  INR 1.3*   Radiology: DG CHEST PORT 1 VIEW  Result Date: 10/23/2020 CLINICAL DATA:  Respiratory failure. EXAM: PORTABLE CHEST 1 VIEW COMPARISON:  10/21/2020 CT 10/21/2020. FINDINGS: Interim placement of left chest tube. Significant reduction left-sided pleural fluid collection. No definite pneumothorax. Endotracheal tube, NG tube, right PICC line stable position. Rib right PICC line tip noted over the right atrium. Heart size stable. Low lung volumes with bibasilar atelectasis/infiltrates. Small right pleural effusion cannot be excluded. Mild left chest wall subcutaneous emphysema. IMPRESSION: 1. Interim placement of left chest tube. Significant reduction in left-sided pleural  fluid collection. No definite pneumothorax. 2.  endotracheal tube, NG tube, right PICC line stable position. 3. Low lung volumes with bibasilar atelectasis/infiltrates. Small right pleural effusion cannot be excluded. Electronically Signed   By: Marcello Moores  Register   On: 10/23/2020 05:37    Assessment/Plan: S/P Procedure(s) (LRB): VIDEO ASSISTED THORACOSCOPY (VATS)/DECORTICATION (Left) 1. CV-PAF with RVR.  2. Pulmonary-Acute respiratory failure, likely secondary to aspiration PNA. On vent. Vent management per CCM. Chest tube with 830 cc since surgery. CXR this am shows low lung volumes, decreased left pleural effusion (hemothorax,s/p left thoracentesis 11/20) and atelectasis. Check CXR in am.  3. GI-s/p CT guided drainage of right retroperitoneal abscess (per general surgery, duodenum is most likely site of perforation)  11/14. NPO, TPN. 4. ID-on Zosyn for RRP abscess, Klebsiella left pleural fluid    Donielle Liston Alba PA-C 10/23/2020 9:12 AM   Serosanguineous chest tube output. We will keep chest tube in until patient is extubated. Rest per primary.  Schon Zeiders Bary Leriche

## 2020-10-23 NOTE — Addendum Note (Signed)
Addendum  created 10/23/20 1737 by Audie Pinto, CRNA   Flowsheet accepted, Intraprocedure Flowsheets edited

## 2020-10-23 NOTE — Progress Notes (Addendum)
PHARMACY - TOTAL PARENTERAL NUTRITION CONSULT NOTE  Indication:  Perforated duodenum  Patient Measurements: Weight: 59.3 kg (130 lb 11.7 oz)   Body mass index is 20.48 kg/m. Usual Weight: 120-125 lbs  Assessment:  64 YOM presented on 10/06/2020 with right MCA occlusion and underwent revascularization.  PMH significant for PUD post partial gastrectomy in 1990.  Patient was started on a dysphagia diet on 10/08/20 and also transferred out of the ICU.  Post-op course complicated by Afib RVR, aspiration PNA/cellulitis and AKI.  He was then started on TF on 10/12/20, which ended on 10/13/20 when CT showed perinephric abscess with gas formation.  Drain was placed and there is concern for fistulous communication to the colon on 10/16/20 CT.  Nutrition has been inadequate since admission and Pharmacy consulted for TPN management.  Per patient's son, patient has no recent weight loss and was eating a balanced diet PTA.  Glucose / Insulin: no hx DM - CBGs mildly elevated.  Utilized 14 units SSI. Electrolytes: K up to 5.3, high CL, low CO2 (max acetate in TPN), Phos elevated at 6.3 (Ca x Phos = 58, goal < 55) Renal: SCr up to 2.96, BUN up 64 LFTs / TGs:  AST/ALT up slightly to 170/99, tbili 1.7, TG WNL Prealbumin / albumin: albumin 1.3, baseline prealbumin 9.4>8.3 Intake / Output; MIVF: UOP 1 ml/kg/hr, drain , chest tube , net +7.7L GI Imaging:  11/20 CT - large IA fluid and blood in LUQ worrisome for active bleeding, renal cysts, cannot exclude fistulous communication with large bowel Surgeries / Procedures:  11/20 - intubated, thoracentesis -- drained cloudy, yellow appearing fluid 11/22 - VATS/decortication  Central access: PICC 10/17/20 TPN start date: 10/17/20  Nutritional Goals (per RD rec on 11/20): 1800-2000 kCal, 100-115gm protein per day  Current Nutrition:  TPN  Plan:  OK to continue current TPN bag per discussion with provider - giving Lasix and bicarb Continue TPN at  80 ml/hr, providing 109g AA, 326g CHO and 29g ILE for a total of 1836 kCal, meeting 100% of patient needs Electrolytes in TPN: Na 77mEq/L added 11/22, remove K, Ca 30mEq/L, Mag 83mEq/L, remove Phos, max acetate Add standard MVI and trace elements to TPN Increase SSI to resistant Q4H - may need to add insulin back to TPN Standard TPN labs on Thurs.  Monitor CBGs and I/O's  Jackqueline Aquilar D. Laney Potash, PharmD, BCPS, BCCCP 10/23/2020, 9:11 AM

## 2020-10-23 NOTE — Progress Notes (Signed)
Primary RN was preparing to do PICC line exchange. Peripheral IV on left AC was flushed with no issues or resistance. All pressors were temporarily switched to PIV for line exchange. Patient became hypotensive. Dr Tonia Brooms was made aware of hypotension. D/C orders for PICC line exchange at this time.

## 2020-10-23 NOTE — Progress Notes (Signed)
NAME:  Mark May, MRN:  [id number], DOB:  10/30/1956, LOS: 18 ADMISSION DATE:  10/28/2020, CONSULTATION DATE: 10/13/2020 REFERRING MD: Dr. Curtis SitesAshish Aurora, CHIEF COMPLAINT: Left-sided weakness  Brief History   64 year old male with HTN and HLD who presented with left-sided weakness, noted to have right MCA occlusion status post thrombectomy and stent placement 11/5.  Hospitalization complicated by hypoxic respiratory failure secondary to pulmonary edema requiring BiPAP and lasix.  Awaiting CIR placement however, on the evening of 11/10, he was noted to become more lethargic with tachycardia and tachypnea.  Had complained of abdominal pain with some distention and chest pain which resolved after burping.  KUB obtained which was normal.  On 11/11, he was somewhat more responsive but now with increasing sCr in which lisinopril was stopped and development of fever 102.6  TRH initially consulted for help with medical management however on their evaluation, PCCM consulted given ill appearance and developing hypotension and new onset Afib with RVR found to have sepsis with RLL pneumonia likely due to aspiration started on zosyn. PCCM consulted for continue right abdominal pain and distension with concern for bowel perforation secondary to Cortrak.  On 11/13, CT abd/ pelvis showed large right perinephric fluid collection and pockets of air concerning for an infectious process/abscess. Underwent CT guided drain placement of right retroperitoneal abscess.  On 11/15, he developed melena with anemia.  GI consulted with plans to medically management and deferred EGD.  Surgery was consulted on 11/17 with imaging concerning for possible contained bowel perforation.  On 11/20, he developed respiratory distress requiring intubation. Underwent thoracentesis on 11/20.  Hypotensive with Hgb drob on 11/21, CT ABD/Pelvis w/ concern for active RP bleed.  Subsequently, developed hemothorax requiring OR for VATS on 11/22.    Past  Medical History  Hypertension Peptic ulcer disease, previous partial gastrectomy (in [place]/ 1990) Gout BPH Sciatica   Significant Hospital Events   11/05 Mechanical thrombectomy of right MCA and stent 11/5 11/11 PCCM reconsulted  11/12 Cortrak placed 11/14 CT guided drainage for R RP abscess with JP 11/17 PCCM reconsulted 11/18 CCM recalled by rapid response, fever, resp distress, CXR new left infiltrates with left pleural effusion 11/20 Thoracentesis  11/21 Hypotensive with Hgb drop to 6.1 s/p 2 units PRBC, CT ABD with concerning for active bleeding 11/22 To OR for VATS, decortication   Consults:  IR Neurology PCCM 11/5- 11/8; 11/11; 11/16-11/17, 11/18 -  TRH Urology 11/13 GI 11/15 CCS 11/17  Procedures:  R Femoral Sheath 11/6 >> 11/6 RUE PICC 11/17 >>  ETT 11/20 >>   Significant Diagnostic Tests:  11/5 CT head >> No acute abnormality.  11/5 CTA head / neck >> Severe distal right M1 stenosis or subocclusive thrombus, Right MCA infarct with extensive penumbra, 4 mm right supraclinoid ICA aneurysm. Widely patent cervical carotid and vertebral arteries. 11/5 Postprocedure CT head >> Hypodensity in the right lateral temporal lobe now shows mild hemorrhage or contrast enhancement. This is most consistent with acute infarct. There has been interval stenting of the right middle cerebral artery. 11/6 MRI brain >> Fairly extensive patchy acute/early subacute infarcts within the right MCA/watershed territory.  Additional small acute/early subacute infarcts within the dorsal right thalamus and left basal ganglia. 11/6  MRA head >>  Interval stenting of the M1/M2 right middle cerebral artery. Flow related signal is present proximal and distal to the stent suggestive of stent patency. 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 11/6 TTE >> LVEF 70-75%, no RWMA,  Grade II diastolic dysfunction, elevated LA pressure 11/13 CT ABD/Pelvis >> large right  perinephric fluid collection and pockets of air concerning for an infectious process/abscess. Cholelithiasis. Nonobstructing right renal calculi. No hydronephrosis. Colonic diverticulosis. No bowel obstruction. Partially visualized small bilateral pleural effusions with complete consolidative changes of the visualized lower lobes. 11/16 CT ABD/Pelvis >> interval placement of drainage catheter into the right perinephric space in the area of previously seen gas and fluid collection. Fluid collection has decreased since prior study. There is now contrast material seen within the perinephric space and extending into the right paracolic gutter. This is concerning for possible fistulous communication to the colon. May consider contrast injection through the drainage catheter under fluoroscopic guidance to assess for enteric fistula. Moderate bilateral pleural effusions with bibasilar atelectasis or consolidation, unchanged.  Cholelithiasis.  NG tube in the stomach.  Aortic atherosclerosis.  Small to moderate free fluid in the pelvis. 11/20 L Thoracentesis 650 ml cloudy yellow fluid 11/21 CT abd/ pelvis >> very large area of intra-abdominal fluid and blood within the left upper quadrant, with an appearance worrisome for active bleeding.  Stable position of the right-sided percutaneous drainage catheter with a moderate amount of surrounding contained free air, fluid and inflammatory fat stranding. Fistulous communication with the adjacent portion of large bowel cannot be excluded. Moderate to marked amount of para muscular subcutaneous inflammatory fat stranding along the lateral aspects of the left abdominal and pelvic walls.   Micro Data:  MRSA PCR 11/5 >> negative COVID 11/5 >> negative Flu 11/5 >> negative UC 11/10 >> neg BCx2 11/11 >> neg BC x2 11/13 >> neg Right RRP abscess 11/14 >> moderate candida, klebsiella >>  BCx 2 11/19 >> Left pleural fluid 11/20 >> Klebsiella >>   Antimicrobials:  Cefazolin  11/5 >> 11/6 Flagyl 11/11 x1 Cefepime 11/11, 11/18 >> 11/19  Vanco 11/12 >> 11/16, 11/18  Unasyn 11/17 >> 11/18  Flagyl 11/18 >> 11/19  Diflucan 11/17 >>  Zosyn 11/11 >> 11/17, 11/19 >>   Interim history/subjective:   Patient status post VATS yesterday.  No issues overnight.  Remains sedated on fentanyl.  TPN restarted.  Remains critically ill intubated mechanical life support.  Objective   Blood pressure (!) 107/45, pulse 86, temperature 98.2 F (36.8 C), temperature source Oral, resp. rate 15, weight 59.3 kg, SpO2 100 %.    Vent Mode: PRVC FiO2 (%):  [30 %] 30 % Set Rate:  [15 bmp] 15 bmp Vt Set:  [520 mL] 520 mL PEEP:  [5 cmH20] 5 cmH20 Plateau Pressure:  [21 cmH20-23 cmH20] 23 cmH20   Intake/Output Summary (Last 24 hours) at 10/23/2020 0951 Last data filed at 10/23/2020 0900 Gross per 24 hour  Intake 5187.34 ml  Output 2485 ml  Net 2702.34 ml   Filed Weights   10/20/20 0500 10/21/20 0424 10/14/2020 0100  Weight: 56.1 kg 56.1 kg 59.3 kg   Physical Exam General: Cachectic gentleman, intubated mechanical life support HEENT: Opens eyes with voice with sedation lightened Neuro: Remains sedated on support CV: Regular rhythm, S1-S2 PULM: Bilateral mechanically ventilated breath sounds GI: Soft mildly distended Extremities: Dependent edema upper and lower extremities bilateral flanks Skin: No significant rash  Resolved Hospital Problems:  Hypernatremia  Assessment & Plan:   Complex clinical course: admitted with R MCA CVA. Developed   Acute respiratory failure secondary to aspiration pneumonia with bilateral pleural effusion, left klebsiella empyema  Hemothorax following thoracentesis  S/p thora on 11/20 with exudative properties, klebsiella in pleural culture & RP  abscess.  Concern for post thora extravasation on CTA chest  Plan: Continue full vent support Wean FiO2 and PEEP as tolerated Daily SBT SAT Switch sedation today from fentanyl to Precedex, but good as  needed fentanyl. Goal RASS 0 to -1. Would like to extubate within the next 24 to 48 hours.  Mixed Shock - Septic + ABLA / Hypovolemic Multifactorial, ABLA and septic. S/p 2 units blood on 11/21.  Klebsiella Empyema + fluid collection in abd.  Plan: Continue observe for any sign of bleeding. Continue antimicrobials Continue antifungals Switch from Neo-Synephrine to vasopressin in the setting worsening renal function.  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 Superior-most edge lines up with distal end of cortrak. - post op care per CCS - TPN per CCS   AKI  Suspect secondary to hypoperfusion with ABLA - Consult to nephrology  - diuresis today, lasic and metolazone - likely has ATN   PAF with RVR -tele monitoring  - unable to start [facility] due to bleeding  - we will discuss with Dr. Cliffton Asters - at this point [facility] if is for stroke prevention   Acute R MCA CVA s/p thrombectomy and right MCA stent placement by IR with TICI 3 Left hemiplegia Dysphagia - continue ASA alone   Severe Protein Calorie Malnutrition - TPN, can restart bowel feeding when CCS agrees    Best practice:  Diet: bowel rest/ NPO. Continue TPN Pain/Anxiety/Delirium protocol (if indicated):  Fentanyl gtt  VAP protocol (if indicated): Needs DENTAL consult. Oral care protocol DVT prophylaxis: SCDs for now GI prophylaxis: PPI BID Glucose control: SSI Mobility: bed rest Code Status: full code Family Communication: Updated per Dr. Tonia Brooms 11/22 Disposition:  ICU  LABS    PULMONARY Recent Labs  Lab 10/18/20 2208 10/20/20 0359  PHART 7.532* 7.302*  PCO2ART 22.4* 42.6  PO2ART 56.7* 76*  HCO3 19.0* 21.1  TCO2   --  22  O2SAT 92.3 94.0    CBC Recent Labs  Lab 10/15/2020 0948 10/08/2020 2020 10/23/20 0536  HGB 9.4* 10.1* 10.6*  HCT 29.1* 31.0* 33.2*  WBC 20.1* 19.8* 16.9*  PLT 207 155 182    COAGULATION Recent Labs  Lab 10/21/20 0139 10/14/2020 0446  INR 1.7* 1.3*    CARDIAC  No results for input(s): TROPONINI in the last 168 hours. No results for input(s): PROBNP in the last 168 hours.   CHEMISTRY Recent Labs  Lab 10/17/20 0920 10/17/20 0920 10/18/20 0726 10/18/20 0726 10/19/20 0032 10/19/20 0032 10/20/20 0359 10/20/20 0359 10/20/20 4403 10/20/20 0632 10/21/20 1737 10/21/20 1737 10/27/2020 0446 10/23/20 0536  NA 147*   < > 148*   < > 148*   < > 149*  --  146*  --  144  --  144 139  K 3.1*   < > 3.6   < > 3.6   < > 3.9   < > 4.0   < > 5.6*   < > 4.7 5.3*  CL 119*   < > 121*   < > 121*  --   --   --  119*  --  118*  --  120* 115*  CO2 19*   < > 19*   < > 20*  --   --   --  19*  --  17*  --  16* 16*  GLUCOSE 158*   < > 210*   < > 130*  --   --   --  341*  --  209*  --  116* 225*  BUN 33*   < > 27*   < > 26*  --   --   --  35*  --  66*  --  61* 64*  CREATININE 1.10   < > 1.07   < > 1.03  --   --   --  1.20  --  2.62*  --  2.74* 2.96*  CALCIUM 7.4*   < > 7.3*   < > 7.4*  --   --   --  7.5*  --  7.3*  --  6.8* 7.0*  MG 2.2  --  2.2  --  2.2  --   --   --  2.1  --   --   --  2.0  --   PHOS 4.0   < > 2.7  --  3.0  --   --   --  3.7  --   --   --  4.9* 6.3*   < > = values in this interval not displayed.   Estimated Creatinine Clearance: 20.9 mL/min (A) (by C-G formula based on SCr of 2.96 mg/dL (H)).   LIVER Recent Labs  Lab 10/18/20 0726 10/21/20 0139 10/21/20 0627 10/21/20 1737 10/06/2020 0446  AST 38  --   --  144* 170*  ALT 44  --   --  80* 99*  ALKPHOS 142*  --   --  70 67  BILITOT 1.1  --   --  1.6* 1.7*  PROT 5.1*  --  3.7* 5.0* 4.4*  ALBUMIN 1.3*  --   --  1.5* 1.3*  INR  --  1.7*  --   --  1.3*    INFECTIOUS Recent Labs  Lab 10/18/20 2258 10/19/20 0032  10/20/20 0632  LATICACIDVEN 1.6 1.5  --   PROCALCITON  --  1.38 1.96    ENDOCRINE CBG (last 3)  Recent Labs    10/04/2020 2340 10/23/20 0348 10/23/20 0755  GLUCAP 202* 198* 204*    This patient is critically ill with multiple organ system failure; which, requires frequent high complexity decision making, assessment, support, evaluation, and titration of therapies. This was completed through the application of advanced monitoring technologies and extensive interpretation of multiple databases. During this encounter critical care time was devoted to patient care services described in this note for 33 minutes.  Josephine Igo, DO [facility] 10/23/2020 9:51 AM

## 2020-10-23 NOTE — Progress Notes (Signed)
Nutrition Follow-up  DOCUMENTATION CODES:   Severe malnutrition in context of chronic illness  INTERVENTION:   TPN per Pharmacy -Calorie and protein needs re-assessed; Recommend adjusting TPN to meet nutritional needs   NUTRITION DIAGNOSIS:   Severe Malnutrition related to chronic illness (stomach ulcer s/p partial gastrectomy) as evidenced by moderate fat depletion, severe fat depletion, moderate muscle depletion, severe muscle depletion.  Being addressed via PN  GOAL:   Patient will meet greater than or equal to 90% of their needs  Met  MONITOR:   Skin, Weight trends, Labs, I & O's (TPN tolerance)  REASON FOR ASSESSMENT:   Rounds    ASSESSMENT:   Mark May is a 64 y.o. Asian male with PMH of hypertension, BPH, sciatica, stomach ulcer status post surgery in the past presented to ER for acute onset left-sided weakness, left facial droop and left hemianopia.  11/5 s/p mechanical thrombectomy of right MCA and stent 11/5 intubated 11/7 extubated 11/8 s/p BSE - recommend continue NPO 11/9 s/p BSE - advanced to dysphagia 3 with nectar thick liquids 11/12 Cortrak tube placed; tip of tube confirmed in stomach 11/14 s/p right retroperitoneal drain placement in IR in setting of right retroperitoneal/perinephric abscess 11/15 initiated TF after GI clearance 11/16 TF stopped 11/17 initiated TPN 11/20 intubated, thoracentesis with 650 mL removed 11/22 L.. hemothorax to OR for VATS  Pt with contained bowel perforation; Noted plan for UGI vs CT to evaluate for persistent leak Noted possible extubation in the next 24-48 hours  TPN continues with plan for potassium and phosphorus removal starting tonight given AKI with hyperkalemia and hyperphosphatemia Current TPN at 80 provides 109 g of protein and 1836 kcals.   Chest tube with 830 mL out in 24 hours, Drains with 130 mL  No pressure injuries noted per RN skin assessment  Current wt 59 kg; admit weight 57 kg; lowest  weight 56 kg. Net +7 L with moderate edema on exam. Unsure of EDW  Labs: potassium 5.3 (H), phosphorus 6.3 (H), CBGs 100-204 (ICU goal 140-180) Meds: reviewed  Diet Order:   Diet Order            Diet NPO time specified Except for: Other (See Comments)  Diet effective now                 EDUCATION NEEDS:   Education needs have been addressed  Skin:  Skin Assessment: Reviewed RN Assessment  Last BM:  10/20/2020 - medium type 6  Height:   Ht Readings from Last 1 Encounters:  10/02/2020 _0  (1.702 m)    Weight:   Wt Readings from Last 1 Encounters:  10/01/2020 59.3 kg    Ideal Body Weight:  67.3 kg  BMI:  Body mass index is 20.48 kg/m.  Estimated Nutritional Needs:   Kcal:  1950-2240 kcals  Protein:  115-130 g  Fluid:  > 1.8 L    Mark Passey MS, RDN, LDN, CNSC Registered Dietitian III Clinical Nutrition RD Pager and On-Call Pager Number Located in [place]

## 2020-10-23 NOTE — Progress Notes (Signed)
At bedside to exchange PICC to triple lumen.  Patients vital signs became critical and PICC exchange aborted.  Will hold off PICC exchange for now.  Floor RN at bedside.

## 2020-10-23 NOTE — Progress Notes (Signed)
OT Cancellation Note  Patient Details Name: Mark May MRN: [id number] DOB: Sep 16, 1956   Cancelled Treatment:    Reason Eval/Treat Not Completed: Patient not medically ready. Will follow.  Thornell Mule, OT/L   Acute OT Clinical Specialist Acute Rehabilitation Services Pager [phone] Office [phone]  10/23/2020, 9:00 AM

## 2020-10-23 NOTE — Consult Note (Signed)
[facility] Renal Consultation Note  Requesting MD: Audie Box, DO Indication for Consultation:  AKI   Chief complaint: left-sided weakness   HPI: Mark May is a 64 y.o. male with a history of HTN and HLD and a complicated recent course.  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  Had a thrombectomy and stent placement on 11/5.  He has had hypoxic resp failure requiring BIPAP and ultimately later in his recent course, intubation on 11/20 for resp distress.  His hospitalization has unfortunately been more recently complicated by bowel perforation 2/2 cortrack, retroperitoneal abscess, and a thoracentesis (11/20) which was complicated by intercostal artery bleed per pulm report.  He is s/p VATS for hemothorax on 11/22.  He has been on norepi which critical care feels has been related to sedation.  Note also hypotension with hemoglobin drop on 11/21 with CT a/p concerning for RP bleed.  Note earlier in his course, he also had a CT-guided drain placement for right retroperitoneal abscess.  Earlier in his course had melena as well.  Most recently has developed AKI.  Nephrology is consulted for assistance with management of AKI.  Lab trends as below with baseline creatinine 1 or less and creatinine ultimately 2.96 on 11/23 the date of consult.  He had 1.4 liters UOP over 11/22 charted (up from 730 mL charted the day before) but did have a rise in Cr.  He was ordered metolazone on 11/23 and has three doses of lasix 40 mg IV ordered for 11/23 (every 6 hours).  Patient has been on phenylephrine 160 mcg/min.  I spoke with his son who has been at bedside and he is keeping his other family closely informed of his dad's status including his mother/pt's wife.  They are hopeful that maybe once the patient is safely off of bowel rest that things will start to turn around.  There's just been a lot going on.  Creatinine  Date/Time Value Ref Range Status   01/06/2015 02:48 PM 0.88 0.60 - 1.30 mg/dL Final   Creatinine, Ser  Date/Time Value Ref Range Status  10/23/2020 05:36 AM 2.96 (H) 0.61 - 1.24 mg/dL Final  78/93/8101 75:10 AM 2.74 (H) 0.61 - 1.24 mg/dL Final  25/85/2778 24:23 PM 2.62 (H) 0.61 - 1.24 mg/dL Final    Comment:    DELTA CHECK NOTED  10/20/2020 06:32 AM 1.20 0.61 - 1.24 mg/dL Final  53/61/4431 54:00 AM 1.03 0.61 - 1.24 mg/dL Final  86/76/1950 93:26 AM 1.07 0.61 - 1.24 mg/dL Final  71/24/5809 98:33 AM 1.10 0.61 - 1.24 mg/dL Final  82/50/5397 67:34 AM 1.32 (H) 0.61 - 1.24 mg/dL Final  19/37/9024 09:73 AM 1.40 (H) 0.61 - 1.24 mg/dL Final  53/29/9242 68:34 AM 1.35 (H) 0.61 - 1.24 mg/dL Final  19/62/2297 98:92 AM 1.40 (H) 0.61 - 1.24 mg/dL Final  11/94/1740 81:44 AM 1.61 (H) 0.61 - 1.24 mg/dL Final  81/85/6314 97:02 PM 1.86 (H) 0.61 - 1.24 mg/dL Final  63/78/5885 02:77 AM 1.96 (H) 0.61 - 1.24 mg/dL Final  41/28/7867 67:20 AM 1.69 (H) 0.61 - 1.24 mg/dL Final  94/70/9628 36:62 AM 1.48 (H) 0.61 - 1.24 mg/dL Final  94/76/5465 03:54 AM 1.39 (H) 0.61 - 1.24 mg/dL Final  65/68/1275 17:00 AM 0.81 0.61 - 1.24 mg/dL Final  17/49/4496 75:91 AM 1.02 0.61 - 1.24 mg/dL Final  63/84/6659 93:57 PM 0.89 0.61 - 1.24 mg/dL Final  01/77/9390 30:09 PM 0.74 (L) 0.76 - 1.27 mg/dL Final  02/17/2018 03:58 PM 0.86 0.76 - 1.27 mg/dL Final     PMHx:   Past Medical History:  Diagnosis Date  . Gout   . Hypertension     Past Surgical History:  Procedure Laterality Date  . IR CT HEAD LTD  10/07/2020  . IR INTRA CRAN STENT  10/21/2020  . IR PERCUTANEOUS ART THROMBECTOMY/INFUSION INTRACRANIAL INC DIAG ANGIO  10/16/2020  . PARTIAL GASTRECTOMY  1990   done in [place] for PUD  . PROSTATE BIOPSY  ~ 2005   negative for cancer (done in [place])  . RADIOLOGY WITH ANESTHESIA N/A 10/01/2020   Procedure: IR WITH ANESTHESIA;  Surgeon: Radiologist, Medication, MD;  Location: [facility] OR;  Service: Radiology;  Laterality: N/A;    Family Hx:  Family History  Problem  Relation Age of Onset  . Diabetes Mother   . Diabetes Father     Social History:  reports that he has never smoked. He has never used smokeless tobacco. He reports that he does not drink alcohol and does not use drugs.  Allergies: No Known Allergies  Medications: Prior to Admission medications   Medication Sig Start Date End Date Taking? Authorizing Provider  allopurinol (ZYLOPRIM) 100 MG tablet Take 3 tablets (300 mg total) by mouth daily. Patient not taking: Reported on 10/09/2020 02/17/18   Reubin Milan, MD  amLODipine (NORVASC) 10 MG tablet Take 1 tablet (10 mg total) by mouth daily. Patient not taking: Reported on 10/09/2020 02/17/18   Reubin Milan, MD  tamsulosin [facility]) 0.4 MG CAPS capsule Take 1 capsule (0.4 mg total) by mouth daily after supper. Patient not taking: Reported on 10/09/2020 03/09/18   Reubin Milan, MD    I have reviewed the patient's current and reported prior to admission medications.  Labs:  BMP Latest Ref Rng & Units 10/23/2020 2020-10-31 10/21/2020  Glucose 70 - 99 mg/dL 423(N) 361(W) 431(V)  BUN 8 - 23 mg/dL 40(G) 86(P) 61(P)  Creatinine 0.61 - 1.24 mg/dL 5.09(T) 2.67(T) 2.45(Y)  BUN/Creat Ratio 10 - 24 - - -  Sodium 135 - 145 mmol/L 139 144 144  Potassium 3.5 - 5.1 mmol/L 5.3(H) 4.7 5.6(H)  Chloride 98 - 111 mmol/L 115(H) 120(H) 118(H)  CO2 22 - 32 mmol/L 16(L) 16(L) 17(L)  Calcium 8.9 - 10.3 mg/dL 7.0(L) 6.8(L) 7.3(L)    Urinalysis    Component Value Date/Time   COLORURINE AMBER (A) 10/10/2020 1714   APPEARANCEUR CLOUDY (A) 10/10/2020 1714   APPEARANCEUR CLEAR 01/06/2015 1448   LABSPEC 1.019 10/10/2020 1714   LABSPEC 1.020 01/06/2015 1448   PHURINE 5.0 10/10/2020 1714   GLUCOSEU NEGATIVE 10/10/2020 1714   GLUCOSEU NEGATIVE 01/06/2015 1448   HGBUR SMALL (A) 10/10/2020 1714   BILIRUBINUR NEGATIVE 10/10/2020 1714   BILIRUBINUR neg 03/09/2018 1613   BILIRUBINUR NEGATIVE 01/06/2015 1448   KETONESUR NEGATIVE 10/10/2020 1714    PROTEINUR 100 (A) 10/10/2020 1714   UROBILINOGEN 0.2 03/09/2018 1613   NITRITE NEGATIVE 10/10/2020 1714   LEUKOCYTESUR NEGATIVE 10/10/2020 1714   LEUKOCYTESUR NEGATIVE 01/06/2015 1448     ROS:  Unable to obtain 2/2 mechanical ventilation  Physical Exam: Vitals:   10/23/20 1015 10/23/20 1030  BP:    Pulse: 84 83  Resp: 15 15  Temp:    SpO2: 100% 100%     General: Adult male in bed intubated HEENT: Normocephalic atraumatic Eyes: Eyes are closed and does not open eyes to voice while on sedation Neck: Trachea midline normal neck circumference Heart: S1-S2 no rub appreciated  Lungs: Lungs are clear anteriorly Abdomen: Softly distended no/red bowel sounds appreciated Extremities: 1+ edema of upper and lower extremities no cyanosis or clubbing Skin: No rash on extremities exposed Neuro: Patient on Precedex and fentanyl continuously as of my exam  Assessment/Plan:  # Acute kidney injury - Multifactorial ATN in the setting of hypotension and ischemic injury from anemia as well as contrast on 11/21 (CTA C/a/p).  Note no hydro on CT and multiple nonobstructing right renal calculi - Continue supportive care  - Will discontinue metolazone - he doesn't appear to have received  - Discontinue additional lasix - getting a dose of 40 mg IV once now  - No acute indication for dialysis and I am hopeful for improvement or stabilization with supportive measures - he has had multiple procedural complications at this point  - would defer picc if possible given the AKI  - pharmacy is dosing zosyn - check UA and up/cr ratio  # Hyperkalemia - Spoke with pharmacy and they have already removed potassium and phos from the TPN for tonight - note also giving lasix.   # Metabolic acidosis - Setting of shock, AKI - on bowel rest - can't take bicarb.  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

## 2020-10-23 NOTE — Progress Notes (Addendum)
Referring Physician(s): Dr. Adaline Sill  Supervising Physician: Richarda Overlie  Patient Status:  [facility] - In-pt  Chief Complaint:  Perinephric abscess s/p Abscess drain placement 11.14.21 by Dr. Fredia Sorrow  Subjective:  Unable to abscess due to patient condition. Patient not arousable  to verbal and light physical stimuli. Son at bedside.  Allergies: Patient has no known allergies.  Medications: Prior to Admission medications   Medication Sig Start Date End Date Taking? Authorizing Provider  allopurinol (ZYLOPRIM) 100 MG tablet Take 3 tablets (300 mg total) by mouth daily. Patient not taking: Reported on 10/09/2020 02/17/18   Reubin Milan, MD  amLODipine (NORVASC) 10 MG tablet Take 1 tablet (10 mg total) by mouth daily. Patient not taking: Reported on 10/09/2020 02/17/18   Reubin Milan, MD  tamsulosin [facility]) 0.4 MG CAPS capsule Take 1 capsule (0.4 mg total) by mouth daily after supper. Patient not taking: Reported on 10/09/2020 03/09/18   Reubin Milan, MD     Vital Signs: BP (!) 107/45   Pulse 83   Temp 98.2 F (36.8 C) (Oral)   Resp 15   Wt 130 lb 11.7 oz (59.3 kg)   SpO2 100%   BMI 20.48 kg/m   Physical Exam Constitutional:      Appearance: He is ill-appearing.  Cardiovascular:     Rate and Rhythm: Normal rate. Rhythm irregular.  Pulmonary:     Comments: Patient trached and vented.  Abdominal:     Comments: Positive right sided perinephric drain  to  Suction. Site is unremarkable with no erythema, edema, tenderness, bleeding or drainage noted at exit site. Suture and stat lock in place. Dressing is clean dry and intact. Tan output noted to be in the line of the drain. Minimal output noted to be in the JP drain itself. Drain is able to be flushed easily.       Imaging: CT ABDOMEN PELVIS WO CONTRAST  Result Date: 10/21/2020 CLINICAL DATA:  Abdominal pain and abdominal distension. EXAM: CT ABDOMEN AND PELVIS WITHOUT CONTRAST TECHNIQUE: Multidetector CT  imaging of the abdomen and pelvis was performed following the standard protocol without IV contrast. COMPARISON:  October 16, 2020 FINDINGS: Lower chest: Moderate severity areas of consolidation are seen within the bilateral lung bases. There is a large left pleural effusion with suspected loculated components. This is increased in size when compared to the prior study. A small to moderate sized right pleural effusion is also noted. Hepatobiliary: No focal liver abnormality is seen. Numerous subcentimeter gallstones are seen within the lumen of a normal appearing gallbladder. There is no evidence of biliary dilatation. Pancreas: Unremarkable. No pancreatic ductal dilatation or surrounding inflammatory changes. Spleen: The spleen is displaced within the anteromedial aspect of the left upper quadrant the. A 15.5 cm x 11.3 cm x 16.9 cm area containing a mixture of hemorrhagic and non hemorrhagic fluid is seen within the left upper quadrant. This appears to be retroperitoneal in location and represents a new finding when compared to the prior exam. Adrenals/Urinary Tract: Adrenal glands are unremarkable. Kidneys are normal in size. Multiple stable bilateral renal cysts of various sizes are seen. Multiple 2 mm, 3 mm and 4 mm nonobstructing renal stones are seen throughout the right kidney. A mild amount of air is again seen within the lumen of the urinary bladder. Stomach/Bowel: A nasogastric tube is seen with its distal tip noted within the body of the stomach. The appendix is not clearly identified. No evidence of bowel dilatation. A  2.1 cm x 1.3 cm area of oral contrast is seen adjacent to the posteromedial aspect of the cecum (axial CT image 69, CT series number 3). This is present on the prior study. Vascular/Lymphatic: There is moderate severity calcification of the abdominal aorta and bilateral common iliac arteries, without evidence of aneurysmal dilatation. No enlarged abdominal or pelvic lymph nodes.  Reproductive: Prostate is unremarkable. Other: A percutaneous drainage catheter is again seen entering via the right flank. Its distal tip is noted along the posterior aspect of the right lower quadrant and is unchanged in position when compared to the prior study. Numerous foci of moderate severity contained free air are again seen within this region. Persistent surrounding mesenteric inflammatory fat stranding and mesenteric fluid is also noted. A small amount of posterior pelvic free fluid is seen. This is stable in appearance when compared to the prior study. Musculoskeletal: A moderate to marked amount of para muscular subcutaneous inflammatory fat stranding is seen along the lateral aspects of the left abdominal and pelvic walls. No acute or significant osseous findings. IMPRESSION: 1. Very large area of intra-abdominal fluid and blood within the left upper quadrant, with an appearance worrisome for active bleeding. 2. Large left pleural effusion with a small to moderate sized right pleural effusion. 3. Moderate severity bibasilar consolidation which may represent atelectasis and/or pneumonia. 4. Cholelithiasis. 5. Multiple stable bilateral renal cysts of various sizes. 6. Stable position of the right-sided percutaneous drainage catheter with a moderate amount of surrounding contained free air, fluid and inflammatory fat stranding. Fistulous communication with the adjacent portion of large bowel cannot be excluded. 7. Moderate to marked amount of para muscular subcutaneous inflammatory fat stranding along the lateral aspects of the left abdominal and pelvic walls. 8. Moderate to marked amount of para muscular. 9. Aortic atherosclerosis. Aortic Atherosclerosis (ICD10-I70.0). Electronically Signed   By: Aram Candela M.D.   On: 10/21/2020 01:53   CT CHEST W CONTRAST  Result Date: 10/21/2020 CLINICAL DATA:  Dropping hematocrit, large hemothorax following thoracentesis yesterday EXAM: CT CHEST, ABDOMEN,  AND PELVIS WITH CONTRAST TECHNIQUE: Multidetector CT imaging of the chest, abdomen and pelvis was performed following the standard protocol during bolus administration of intravenous contrast. CONTRAST:  OMNIPAQUE IOHEXOL 300 MG/ML  SOLN COMPARISON:  CT abdomen pelvis, 10/21/2020, 1:09 a.m. FINDINGS: CT CHEST FINDINGS Cardiovascular: Aortic atherosclerosis. Right upper extremity PICC, tip near the superior cavoatrial junction. Normal heart size. No pericardial effusion. Mediastinum/Nodes: No enlarged mediastinal, hilar, or axillary lymph nodes. Thyroid is normal. Endotracheal and esophagogastric intubation. Lungs/Pleura: Large left, moderate right pleural effusions and associated atelectasis or consolidation. There is extensive, heterogeneous high density in the inferior aspect of the left pleural effusion, consistent with a large hematoma, given relatively circumscribed appearing appears to be extrapleural and measures approximately 15.3 x 12.1 x 8.5 cm (series 6, image 41). There appears to be a small focus of contrast extravasation adjacent to the inferior margin of the posterolateral left tenth rib (series 6, image 64). Musculoskeletal: No chest wall mass or suspicious bone lesions identified. CT ABDOMEN PELVIS FINDINGS Hepatobiliary: No solid liver abnormality is seen. Gallstones and sludge in the gallbladder. No gallbladder wall thickening, or biliary dilatation. Pancreas: Unremarkable. No pancreatic ductal dilatation or surrounding inflammatory changes. Spleen: Normal in size without significant abnormality. Adrenals/Urinary Tract: Adrenal glands are unremarkable. Multiple nonobstructive right renal calculi. No left-sided calculi, ureteral calculi, or hydronephrosis. Small air-fluid level within the urinary bladder, similar to prior. Stomach/Bowel: Stomach is within normal limits. Appendix is not  clearly visualized. No evidence of bowel wall thickening, distention, or inflammatory changes.  Vascular/Lymphatic: Aortic atherosclerosis. No enlarged abdominal or pelvic lymph nodes. Reproductive: No mass or other abnormality. Other: Anasarca. Redemonstrated percutaneous pigtail drainage catheter positioned in a right retroperitoneal air and fluid collection, not significantly changed compared to prior examination (series 3, image 79). Small volume fluid attenuation ascites in the low pelvis (series 3, image 105). Musculoskeletal: No acute or significant osseous findings. IMPRESSION: 1. There is a large left pleural effusion, which contains a circumscribed appearing region of heterogeneous high density inferiorly measuring approximately 15.3 x 12.1 x 8.5 cm. This is most consistent with a large extrapleural hematoma and there appears to be a small focus of contrast extravasation adjacent to the inferior margin of the posterolateral left tenth rib, most consistent with an intercostal artery extravasation following thoracentesis. This is not significantly changed compared to prior same day noncontrast examination. 2. Moderate right pleural effusion. 3. Redemonstrated percutaneous pigtail drainage catheter positioned in a right retroperitoneal air and fluid collection, not significantly changed compared to prior examination. 4. Small volume fluid attenuation ascites in the low pelvis. 5. Anasarca. 6. Other chronic and incidental findings as above. Aortic Atherosclerosis (ICD10-I70.0). Findings discussed by telephone with Dr. Barton Fanny at 1:15 p.m., 10/21/2020. Electronically Signed   By: Lauralyn Primes M.D.   On: 10/21/2020 14:01   CT ABDOMEN PELVIS W CONTRAST  Result Date: 10/21/2020 CLINICAL DATA:  Dropping hematocrit, large hemothorax following thoracentesis yesterday EXAM: CT CHEST, ABDOMEN, AND PELVIS WITH CONTRAST TECHNIQUE: Multidetector CT imaging of the chest, abdomen and pelvis was performed following the standard protocol during bolus administration of intravenous contrast. CONTRAST:   OMNIPAQUE IOHEXOL 300 MG/ML  SOLN COMPARISON:  CT abdomen pelvis, 10/21/2020, 1:09 a.m. FINDINGS: CT CHEST FINDINGS Cardiovascular: Aortic atherosclerosis. Right upper extremity PICC, tip near the superior cavoatrial junction. Normal heart size. No pericardial effusion. Mediastinum/Nodes: No enlarged mediastinal, hilar, or axillary lymph nodes. Thyroid is normal. Endotracheal and esophagogastric intubation. Lungs/Pleura: Large left, moderate right pleural effusions and associated atelectasis or consolidation. There is extensive, heterogeneous high density in the inferior aspect of the left pleural effusion, consistent with a large hematoma, given relatively circumscribed appearing appears to be extrapleural and measures approximately 15.3 x 12.1 x 8.5 cm (series 6, image 41). There appears to be a small focus of contrast extravasation adjacent to the inferior margin of the posterolateral left tenth rib (series 6, image 64). Musculoskeletal: No chest wall mass or suspicious bone lesions identified. CT ABDOMEN PELVIS FINDINGS Hepatobiliary: No solid liver abnormality is seen. Gallstones and sludge in the gallbladder. No gallbladder wall thickening, or biliary dilatation. Pancreas: Unremarkable. No pancreatic ductal dilatation or surrounding inflammatory changes. Spleen: Normal in size without significant abnormality. Adrenals/Urinary Tract: Adrenal glands are unremarkable. Multiple nonobstructive right renal calculi. No left-sided calculi, ureteral calculi, or hydronephrosis. Small air-fluid level within the urinary bladder, similar to prior. Stomach/Bowel: Stomach is within normal limits. Appendix is not clearly visualized. No evidence of bowel wall thickening, distention, or inflammatory changes. Vascular/Lymphatic: Aortic atherosclerosis. No enlarged abdominal or pelvic lymph nodes. Reproductive: No mass or other abnormality. Other: Anasarca. Redemonstrated percutaneous pigtail drainage catheter positioned in a  right retroperitoneal air and fluid collection, not significantly changed compared to prior examination (series 3, image 79). Small volume fluid attenuation ascites in the low pelvis (series 3, image 105). Musculoskeletal: No acute or significant osseous findings. IMPRESSION: 1. There is a large left pleural effusion, which contains a circumscribed appearing region of heterogeneous high density  inferiorly measuring approximately 15.3 x 12.1 x 8.5 cm. This is most consistent with a large extrapleural hematoma and there appears to be a small focus of contrast extravasation adjacent to the inferior margin of the posterolateral left tenth rib, most consistent with an intercostal artery extravasation following thoracentesis. This is not significantly changed compared to prior same day noncontrast examination. 2. Moderate right pleural effusion. 3. Redemonstrated percutaneous pigtail drainage catheter positioned in a right retroperitoneal air and fluid collection, not significantly changed compared to prior examination. 4. Small volume fluid attenuation ascites in the low pelvis. 5. Anasarca. 6. Other chronic and incidental findings as above. Aortic Atherosclerosis (ICD10-I70.0). Findings discussed by telephone with Dr. Barton Fanny at 1:15 p.m., 10/21/2020. Electronically Signed   By: Lauralyn Primes M.D.   On: 10/21/2020 14:01   DG CHEST PORT 1 VIEW  Result Date: 10/23/2020 CLINICAL DATA:  Respiratory failure. EXAM: PORTABLE CHEST 1 VIEW COMPARISON:  10/21/2020 CT 10/21/2020. FINDINGS: Interim placement of left chest tube. Significant reduction left-sided pleural fluid collection. No definite pneumothorax. Endotracheal tube, NG tube, right PICC line stable position. Rib right PICC line tip noted over the right atrium. Heart size stable. Low lung volumes with bibasilar atelectasis/infiltrates. Small right pleural effusion cannot be excluded. Mild left chest wall subcutaneous emphysema. IMPRESSION: 1. Interim placement of  left chest tube. Significant reduction in left-sided pleural fluid collection. No definite pneumothorax. 2.  endotracheal tube, NG tube, right PICC line stable position. 3. Low lung volumes with bibasilar atelectasis/infiltrates. Small right pleural effusion cannot be excluded. Electronically Signed   By: Maisie Fus  Register   On: 10/23/2020 05:37   DG Chest Port 1 View  Result Date: 10/21/2020 CLINICAL DATA:  Respiratory failure EXAM: PORTABLE CHEST 1 VIEW COMPARISON:  None. FINDINGS: AE moderate to large left-sided pleural effusion with underlying opacity is stable. The ETT is stable in good position. The OG tube terminates below today's film. No pneumothorax. No change in the cardiomediastinal silhouette. The right mid lung opacity seen previously has resolved. Mild opacity in the right base is probably atelectasis. The right PICC line terminates in the right atrium, unchanged. No other acute abnormalities. IMPRESSION: 1. Support apparatus as above. 2. Persistent moderate to large left effusion. 3. Resolution of right midlung opacity. 4. Mild opacity in the right base is probably atelectasis. Electronically Signed   By: Gerome Sam III M.D   On: 10/21/2020 09:37   DG Chest Port 1 View  Result Date: 10/20/2020 CLINICAL DATA:  Post thoracentesis EXAM: PORTABLE CHEST 1 VIEW COMPARISON:  Radiograph 10/20/2020 FINDINGS: Endotracheal tube tip terminates 2.5 cm from the carina. Transesophageal tube tip and side port distal to the GE junction. Right upper extremity PICC terminates at the right atrium. Telemetry leads overlie the chest. Slight interval decrease in the size of a left pleural effusion with some lobular margins likely reflecting loculation. More coalescent retrocardiac opacity could reflect a combination of layering pleural fluid, atelectasis and/or airspace disease. Increasing coalescence of opacity seen in the right mid and lower lung with a trace right effusion as well. No visible pneumothorax.  The aorta is calcified. The remaining cardiomediastinal contours are unremarkable. No acute osseous or soft tissue abnormality. Degenerative changes are present in the imaged spine and shoulders. IMPRESSION: 1. Slight interval decrease in size of a left pleural effusion with some lobular margins which could reflect loculation. 2. Slightly increased coalescent retrocardiac and right mid and lower lung opacities, could reflect a combination of layering pleural fluid, atelectasis and/or airspace disease. 3.  No pneumothorax. 4. Lines and tubes as above. 5.  Aortic Atherosclerosis (ICD10-I70.0). Electronically Signed   By: Kreg ShropshirePrice  DeHay M.D.   On: 10/20/2020 19:50   DG CHEST PORT 1 VIEW  Result Date: 10/20/2020 CLINICAL DATA:  Respiratory distress.  Intubation. EXAM: PORTABLE CHEST 1 VIEW COMPARISON:  10/18/2020 and prior radiographs FINDINGS: An endotracheal tube is now noted. The carina is difficult to definitely identified but the endotracheal tube tip appears to lie approximately 2 cm above the carina. An NG tube is identified extending into the stomach with tip off the field of view. Moderate to large LEFT pleural effusion, LEFT LOWER lung consolidation/atelectasis, mild RIGHT basilar atelectasis, small RIGHT pleural effusion and pulmonary vascular congestion again noted. There is no evidence of pneumothorax. A RIGHT PICC line is present with tip overlying the UPPER RIGHT atrium. IMPRESSION: 1. Endotracheal tube placement with tip approximately 2 cm above the carina. NG tube placement as described. 2. Unchanged moderate to large LEFT pleural effusion, LEFT LOWER lung consolidation/atelectasis, mild RIGHT basilar atelectasis and small RIGHT pleural effusion. Electronically Signed   By: Harmon PierJeffrey  Hu M.D.   On: 10/20/2020 04:28   DG Abd Portable 1V  Result Date: 10/20/2020 CLINICAL DATA:  NG tube placement. EXAM: PORTABLE ABDOMEN - 1 VIEW COMPARISON:  Prior CTs and radiographs. FINDINGS: An NG tube is  identified with tip overlying the region of the mid stomach. A percutaneous catheter overlying the RIGHT abdomen is noted. No new abnormalities identified. IMPRESSION: NG tube with tip overlying the mid stomach. Electronically Signed   By: Harmon PierJeffrey  Hu M.D.   On: 10/20/2020 04:30    Labs:  CBC: Recent Labs    2020-10-14 0446 2020-10-14 0948 2020-10-14 2020 10/23/20 0536  WBC 18.5* 20.1* 19.8* 16.9*  HGB 8.8* 9.4* 10.1* 10.6*  HCT 26.9* 29.1* 31.0* 33.2*  PLT 166 207 155 182    COAGS: Recent Labs    10/23/2020 1246 10/11/20 1706 10/21/20 0139 2020-10-14 0446  INR 0.9 1.1 1.7* 1.3*  APTT 25 41* 37*  --     BMP: Recent Labs    10/20/20 0632 10/21/20 1737 2020-10-14 0446 10/23/20 0536  NA 146* 144 144 139  K 4.0 5.6* 4.7 5.3*  CL 119* 118* 120* 115*  CO2 19* 17* 16* 16*  GLUCOSE 341* 209* 116* 225*  BUN 35* 66* 61* 64*  CALCIUM 7.5* 7.3* 6.8* 7.0*  CREATININE 1.20 2.62* 2.74* 2.96*  GFRNONAA >60 26* 25* 23*    LIVER FUNCTION TESTS: Recent Labs    10/15/20 0408 10/15/20 0408 10/18/20 0726 10/21/20 0627 10/21/20 1737 2020-10-14 0446  BILITOT 1.3*  --  1.1  --  1.6* 1.7*  AST 134*  --  38  --  144* 170*  ALT 70*  --  44  --  80* 99*  ALKPHOS 200*  --  142*  --  70 67  PROT 5.1*   < > 5.1* 3.7* 5.0* 4.4*  ALBUMIN 1.3*  --  1.3*  --  1.5* 1.3*   < > = values in this interval not displayed.    Assessment and Plan:  64 y.o. male inpatient. 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 Complicated by hypoxic respiratory failure secondary to pulmonary edema, a fib with RVR, sepsis with RLL PNA with concern for a bowel perforation found to have a perinephric fluid collection on CT on 11.13.21 perinpehric fluid collection. Now intubated with hemothorax s/p VATS on 11.22.2. IR place retroperitoneal abscess  drain on 11.14.21. Per Epic output is 130 ml, 400 ml, 125 ml.  WBC is 16.9, BUN 64, Cr 2.96. Positive right sided perinephric drain  to  Suction.  Site is unremarkable with no erythema, edema, tenderness, bleeding or drainage noted at exit site. Suture and stat lock in place. Dressing is clean dry and intact. Tan output noted to be in the line of the drain. Minimal output noted to be in the JP drain itself. Drain is able to be flushed easily.  Recommend team continue with flushing TID, output recording q shift and dressing changes as needed. Would consider additional imaging when output is less than 10 ml for 24 hours not including flush material.   Continue current treatment plans as per Critical Care.  Electronically Signed: Alene Mires, NP 10/23/2020, 11:43 AM   I spent a total of 15 Minutes at the patient's bedside AND on the patient's hospital floor or unit, greater than 50% of which was counseling/coordinating care for perinephric abscess drain.

## 2020-10-24 ENCOUNTER — Inpatient Hospital Stay (HOSPITAL_COMMUNITY): Payer: 59

## 2020-10-24 DIAGNOSIS — R6521 Severe sepsis with septic shock: Secondary | ICD-10-CM | POA: Diagnosis not present

## 2020-10-24 DIAGNOSIS — A419 Sepsis, unspecified organism: Secondary | ICD-10-CM | POA: Diagnosis not present

## 2020-10-24 DIAGNOSIS — I4891 Unspecified atrial fibrillation: Secondary | ICD-10-CM | POA: Diagnosis not present

## 2020-10-24 DIAGNOSIS — I6601 Occlusion and stenosis of right middle cerebral artery: Secondary | ICD-10-CM | POA: Diagnosis not present

## 2020-10-24 LAB — TYPE AND SCREEN
ABO/RH(D): B POS
Antibody Screen: NEGATIVE
Unit division: 0
Unit division: 0
Unit division: 0
Unit division: 0
Unit division: 0
Unit division: 0

## 2020-10-24 LAB — GLUCOSE, CAPILLARY
Glucose-Capillary: 137 mg/dL — ABNORMAL HIGH (ref 70–99)
Glucose-Capillary: 165 mg/dL — ABNORMAL HIGH (ref 70–99)
Glucose-Capillary: 167 mg/dL — ABNORMAL HIGH (ref 70–99)
Glucose-Capillary: 170 mg/dL — ABNORMAL HIGH (ref 70–99)
Glucose-Capillary: 173 mg/dL — ABNORMAL HIGH (ref 70–99)
Glucose-Capillary: 174 mg/dL — ABNORMAL HIGH (ref 70–99)
Glucose-Capillary: 197 mg/dL — ABNORMAL HIGH (ref 70–99)
Glucose-Capillary: 203 mg/dL — ABNORMAL HIGH (ref 70–99)
Glucose-Capillary: 275 mg/dL — ABNORMAL HIGH (ref 70–99)

## 2020-10-24 LAB — BASIC METABOLIC PANEL
Anion gap: 13 (ref 5–15)
BUN: 73 mg/dL — ABNORMAL HIGH (ref 8–23)
CO2: 19 mmol/L — ABNORMAL LOW (ref 22–32)
Calcium: 7.1 mg/dL — ABNORMAL LOW (ref 8.9–10.3)
Chloride: 110 mmol/L (ref 98–111)
Creatinine, Ser: 2.88 mg/dL — ABNORMAL HIGH (ref 0.61–1.24)
GFR, Estimated: 23 mL/min — ABNORMAL LOW (ref >60)
Glucose, Bld: 224 mg/dL — ABNORMAL HIGH (ref 70–99)
Potassium: 3.6 mmol/L (ref 3.5–5.1)
Sodium: 142 mmol/L (ref 135–145)

## 2020-10-24 LAB — CULTURE, BLOOD (ROUTINE X 2)
Culture: NO GROWTH
Culture: NO GROWTH
Special Requests: ADEQUATE
Special Requests: ADEQUATE

## 2020-10-24 LAB — BPAM RBC
Blood Product Expiration Date: 202112052359
Blood Product Expiration Date: 202112082359
Blood Product Expiration Date: 202112082359
Blood Product Expiration Date: 202112082359
Blood Product Expiration Date: 202112092359
Blood Product Expiration Date: 202112112359
ISSUE DATE / TIME: 202111210316
ISSUE DATE / TIME: 202111210452
ISSUE DATE / TIME: 202111211116
ISSUE DATE / TIME: 202111221456
Unit Type and Rh: 7300
Unit Type and Rh: 7300
Unit Type and Rh: 7300
Unit Type and Rh: 7300
Unit Type and Rh: 7300
Unit Type and Rh: 7300

## 2020-10-24 LAB — CBC
HCT: 28.2 % — ABNORMAL LOW (ref 39.0–52.0)
Hemoglobin: 9.1 g/dL — ABNORMAL LOW (ref 13.0–17.0)
MCH: 27.7 pg (ref 26.0–34.0)
MCHC: 32.3 g/dL (ref 30.0–36.0)
MCV: 85.7 fL (ref 80.0–100.0)
Platelets: 152 10*3/uL (ref 150–400)
RBC: 3.29 MIL/uL — ABNORMAL LOW (ref 4.22–5.81)
RDW: 18.1 % — ABNORMAL HIGH (ref 11.5–15.5)
WBC: 13.8 10*3/uL — ABNORMAL HIGH (ref 4.0–10.5)
nRBC: 0.3 % — ABNORMAL HIGH (ref 0.0–0.2)

## 2020-10-24 LAB — BODY FLUID CULTURE

## 2020-10-24 LAB — HEPARIN LEVEL (UNFRACTIONATED): Heparin Unfractionated: <0.1 IU/mL — ABNORMAL LOW (ref 0.30–0.70)

## 2020-10-24 LAB — SURGICAL PATHOLOGY

## 2020-10-24 MED ORDER — DEXTROSE 10 % IV SOLN: INTRAVENOUS | Status: DC | PRN

## 2020-10-24 MED ORDER — HEPARIN (PORCINE) 25000 UT/250ML-% IV SOLN
1800.0000 [IU]/h | INTRAVENOUS | Status: DC
  Administered 2020-10-24: 700 [IU]/h via INTRAVENOUS
  Administered 2020-10-25: 1050 [IU]/h via INTRAVENOUS
  Administered 2020-10-26: 1600 [IU]/h via INTRAVENOUS
  Filled 2020-10-24 (×3): qty 250

## 2020-10-24 MED ORDER — DEXTROSE 50 % IV SOLN
0.0000 mL | INTRAVENOUS | Status: DC | PRN
  Administered 2020-10-30: 25 mL via INTRAVENOUS
  Administered 2020-10-31: 50 mL via INTRAVENOUS
  Filled 2020-10-24 (×2): qty 50

## 2020-10-24 MED ORDER — INSULIN DETEMIR 100 UNIT/ML ~~LOC~~ SOLN
10.0000 [IU] | Freq: Two times a day (BID) | SUBCUTANEOUS | Status: DC
  Administered 2020-10-24 – 2020-10-25 (×3): 10 [IU] via SUBCUTANEOUS
  Filled 2020-10-24 (×4): qty 0.1

## 2020-10-24 MED ORDER — INSULIN ASPART 100 UNIT/ML ~~LOC~~ SOLN
3.0000 [IU] | SUBCUTANEOUS | Status: DC
  Administered 2020-10-24 – 2020-10-25 (×5): 3 [IU] via SUBCUTANEOUS

## 2020-10-24 MED ORDER — TRAVASOL 10 % IV SOLN
INTRAVENOUS | Status: AC
  Filled 2020-10-24: qty 1220.4

## 2020-10-24 MED ORDER — INSULIN REGULAR(HUMAN) IN NACL 100-0.9 UT/100ML-% IV SOLN
INTRAVENOUS | Status: AC
  Administered 2020-10-24: 3.2 [IU]/h via INTRAVENOUS
  Administered 2020-10-24: 2.2 [IU]/h via INTRAVENOUS
  Filled 2020-10-24: qty 100

## 2020-10-24 MED ORDER — INSULIN ASPART 100 UNIT/ML ~~LOC~~ SOLN
3.0000 [IU] | SUBCUTANEOUS | Status: DC
  Administered 2020-10-24 – 2020-10-25 (×3): 6 [IU] via SUBCUTANEOUS
  Administered 2020-10-25 – 2020-10-26 (×4): 3 [IU] via SUBCUTANEOUS
  Administered 2020-10-26: 6 [IU] via SUBCUTANEOUS
  Administered 2020-10-26: 3 [IU] via SUBCUTANEOUS
  Administered 2020-10-26 (×2): 6 [IU] via SUBCUTANEOUS
  Administered 2020-10-27: 3 [IU] via SUBCUTANEOUS
  Administered 2020-10-27 (×2): 6 [IU] via SUBCUTANEOUS
  Administered 2020-10-27 (×3): 3 [IU] via SUBCUTANEOUS
  Administered 2020-10-28: 6 [IU] via SUBCUTANEOUS
  Administered 2020-10-28 (×4): 3 [IU] via SUBCUTANEOUS
  Administered 2020-10-29: 6 [IU] via SUBCUTANEOUS
  Administered 2020-10-29: 3 [IU] via SUBCUTANEOUS
  Administered 2020-10-29: 9 [IU] via SUBCUTANEOUS
  Administered 2020-11-01 – 2020-11-02 (×9): 3 [IU] via SUBCUTANEOUS
  Administered 2020-11-03: 6 [IU] via SUBCUTANEOUS
  Administered 2020-11-03: 3 [IU] via SUBCUTANEOUS
  Administered 2020-11-03: 6 [IU] via SUBCUTANEOUS
  Administered 2020-11-03: 3 [IU] via SUBCUTANEOUS
  Administered 2020-11-03 – 2020-11-04 (×2): 6 [IU] via SUBCUTANEOUS

## 2020-10-24 NOTE — Progress Notes (Signed)
A line removed as ordered per MD

## 2020-10-24 NOTE — Progress Notes (Signed)
[facility] Progress Note  Name: Mark May MRN: [id number] DOB: 09/19/1956  Chief Complaint:  Initially presented w/left sided weakness   Subjective:  He had 3.8 liters UOP over 11/23.  He didn't have a BMP today - one was just ordered.  Per nurse became unstable when picc was attempted to be changed yesterday (and had temp switched to peripheral) so procedure was aborted.  He has been weaned down on pressors considerably - just on vaso at 0.03.   Review of systems:  Unable to obtain 2/2 intubated   --------------- Background on consult:  Mark May is a 64 y.o. male with a history of HTN and HLD and a complicated recent course.  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  Had a thrombectomy and stent placement on 11/5.  He has had hypoxic resp failure requiring BIPAP and ultimately later in his recent course, intubation on 11/20 for resp distress.  His hospitalization has unfortunately been more recently complicated by bowel perforation 2/2 cortrack, retroperitoneal abscess, and a thoracentesis (11/20) which was complicated by intercostal artery bleed per pulm report.  He is s/p VATS for hemothorax on 11/22.  He has been on norepi which critical care feels has been related to sedation.  Note also hypotension with hemoglobin drop on 11/21 with CT a/p concerning for RP bleed.  Note earlier in his course, he also had a CT-guided drain placement for right retroperitoneal abscess.  Earlier in his course had melena as well.  Most recently has developed AKI.  Nephrology is consulted for assistance with management of AKI.  Lab trends as below with baseline creatinine 1 or less and creatinine ultimately 2.96 on 11/23 the date of consult.  He had 1.4 liters UOP over 11/22 charted (up from 730 mL charted the day before) but did have a rise in Cr.  He was ordered metolazone on 11/23 and has three doses of lasix 40 mg IV ordered for 11/23 (every 6  hours).  Patient has been on phenylephrine 160 mcg/min.  I spoke with his son who has been at bedside and he is keeping his other family closely informed of his dad's status including his mother/pt's wife.  They are hopeful that maybe once the patient is safely off of bowel rest that things will start to turn around.  There's just been a lot going on.  Intake/Output Summary (Last 24 hours) at 10/24/2020 0733 Last data filed at 10/24/2020 0700 Gross per 24 hour  Intake 2616.51 ml  Output 4480 ml  Net -1863.49 ml    Vitals:  Vitals:   10/24/20 0400 10/24/20 0500 10/24/20 0600 10/24/20 0700  BP: 133/80 135/63 (!) 141/66   Pulse: 67 66 (!) 57   Resp: 17 18 15 15   Temp:      TempSrc:      SpO2: 100% 100% 100%   Weight:         Physical Exam:  General: Adult male in bed intubated HEENT: Normocephalic atraumatic Eyes: Eyes are closed and does not open eyes to voice while on sedation Neck: Trachea midline normal neck circumference Heart: S1-S2 no rub appreciated Lungs: coarse breath sounds, occ rhonchi Abdomen: Softly distended no bowel sounds Extremities: 1+ edema of upper and lower extremities Neuro: Patient on Precedex continuously as of my exam GU - foley in place with uop  Medications reviewed    Labs:  BMP Latest Ref Rng & Units 10/23/2020 10/06/2020 10/21/2020  Glucose 70 - 99 mg/dL 315(Q) 008(Q) 761(P)  BUN 8 - 23 mg/dL 50(D) 32(I) 71(I)  Creatinine 0.61 - 1.24 mg/dL 4.58(K) 9.98(P) 3.82(N)  BUN/Creat Ratio 10 - 24 - - -  Sodium 135 - 145 mmol/L 139 144 144  Potassium 3.5 - 5.1 mmol/L 5.3(H) 4.7 5.6(H)  Chloride 98 - 111 mmol/L 115(H) 120(H) 118(H)  CO2 22 - 32 mmol/L 16(L) 16(L) 17(L)  Calcium 8.9 - 10.3 mg/dL 7.0(L) 6.8(L) 7.3(L)     Assessment/Plan:   # Acute kidney injury - Multifactorial ATN in the setting of hypotension and ischemic injury from anemia as well as contrast on 11/21 (CTA C/a/p).  Note no hydro on CT and multiple nonobstructing right renal  calculi - Continue supportive care  - No acute indication for dialysis and I am hopeful for improvement or stabilization with supportive measures.  he has had multiple procedural complications at this point  - Await AM labs.  - Hold additional lasix for now awaiting labs - pharmacy is dosing zosyn - RBC noted; neg protein on UA with up/cr ratio of 2050 mg/g - Increased UOP, getting better clinically on reduced pressors - repeat UA and up/cr ratio and await BMP  # Hyperkalemia - Spoke with pharmacy and they have already removed potassium and phos from the TPN for tonight - await BMP   # Metabolic acidosis - Setting of shock, AKI - on bowel rest - can't take bicarb PO and we have intermittently given IV bicarb amps  # Acute hypoxic respiratory failure - on mechanical ventilation per plan  # Shock - multifactorial  - note abscess, acute blood loss - antibiotics per primary team   # Anemia secondary to acute blood loss - Continue supportive care with transfusions as needed   # Bowel perforation 2/2 cortrack - note on TPN currently   # Retroperitoneal abscess - abx per primary  - s/p drain placement    # 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

## 2020-10-24 NOTE — Progress Notes (Signed)
ANTICOAGULATION CONSULT NOTE   Pharmacy Consult for Heparin Indication: Afib, s/p CVA  No Known Allergies  Patient Measurements: Weight: 59.3 kg (130 lb 11.7 oz) Heparin Dosing Weight:  56.7 kg  Vital Signs: Temp: 97.6 F (36.4 C) (11/24 0343) Temp Source: Axillary (11/24 0343) BP: 107/56 (11/24 0900) Pulse Rate: 56 (11/24 0900)  Labs: Recent Labs    10/17/2020 0446 10/25/2020 0948 10/01/2020 2020 10/20/2020 2020 10/23/20 0536 10/24/20 0406 10/24/20 0750  HGB 8.8*   < > 10.1*   < > 10.6* 9.1*  --   HCT 26.9*   < > 31.0*  --  33.2* 28.2*  --   PLT 166   < > 155  --  182 152  --   LABPROT 15.5*  --   --   --   --   --   --   INR 1.3*  --   --   --   --   --   --   CREATININE 2.74*  --   --   --  2.96*  --  2.88*   < > = values in this interval not displayed.    Estimated Creatinine Clearance: 21.4 mL/min (A) (by C-G formula based on SCr of 2.88 mg/dL (H)).  Assessment: 69 YOM who presented on 11/5 with R MCA stroke s/p IR + stent placement for reocclusion. Noted to have new onset Afib and pharmacy was consulted to dose heparin. Patient with concern for GIB and L hemothorax now s/p VATS procedure originally holding heparin now pharmacy has been consulted to resume. Aiming for lower goal of 0.3-0.5 d/t R MCA stroke and concern for GIB. CBC improved.  Goal of Therapy:  Heparin level 0.3 - 0.5 units/ml Monitor platelets by anticoagulation protocol: Yes    Plan:  Start heparin at 700 units/hr (7 ml/hr) 6h heparin level Daily heparin level, CBC, monitor s/s bleeding  Thank you for allowing pharmacy to be a part of this patient's care.  Kinnie Feil, PharmD PGY1 Acute Care Pharmacy Resident 10/24/2020 10:10 AM  Please check [email or web address] for unit specific pharmacy phone numbers.

## 2020-10-24 NOTE — Progress Notes (Signed)
Progress Note  2 Days Post-Op  Subjective: Sedated on the vent. No family at bedside this AM.   Objective: Vital signs in last 24 hours: Temp:  [97.6 F (36.4 C)-99.8 F (37.7 C)] 97.6 F (36.4 C) (11/24 0343) Pulse Rate:  [54-87] 54 (11/24 0837) Resp:  [14-18] 15 (11/24 0837) BP: (83-151)/(54-80) 101/54 (11/24 0837) SpO2:  [99 %-100 %] 100 % (11/24 8889) Arterial Line BP: (97-157)/(38-61) 107/44 (11/24 0700) FiO2 (%):  [30 %] 30 % (11/24 0838) Last BM Date: 10/20/20  Intake/Output from previous day: 11/23 0701 - 11/24 0700 In: 2616.5 [I.V.:2334.7; IV Piggyback:281.8] Out: 4480 [Urine:3800; Emesis/NG output:200; Drains:110; Chest Tube:370] Intake/Output this shift: No intake/output data recorded.  PE: General: pleasant, WD, cachectic male who is laying in bed intubated and sedated Heart: regular, rate, and rhythm. Palpable radial and pedal pulses bilaterally Lungs: on the vent, L chest tube in place with SS fluid Abd: soft, ND, NGT with bilious drainage, drain present with greenish yellow fluid   Lab Results:  Recent Labs    10/23/20 0536 10/24/20 0406  WBC 16.9* 13.8*  HGB 10.6* 9.1*  HCT 33.2* 28.2*  PLT 182 152   BMET Recent Labs    20-Nov-2020 0446 10/23/20 0536  NA 144 139  K 4.7 5.3*  CL 120* 115*  CO2 16* 16*  GLUCOSE 116* 225*  BUN 61* 64*  CREATININE 2.74* 2.96*  CALCIUM 6.8* 7.0*   PT/INR Recent Labs    November 20, 2020 0446  LABPROT 15.5*  INR 1.3*   CMP     Component Value Date/Time   NA 139 10/23/2020 0536   NA 140 03/09/2018 1620   NA 136 01/06/2015 1448   K 5.3 (H) 10/23/2020 0536   K 3.6 01/06/2015 1448   CL 115 (H) 10/23/2020 0536   CL 99 01/06/2015 1448   CO2 16 (L) 10/23/2020 0536   CO2 28 01/06/2015 1448   GLUCOSE 225 (H) 10/23/2020 0536   GLUCOSE 154 (H) 01/06/2015 1448   BUN 64 (H) 10/23/2020 0536   BUN 12 03/09/2018 1620   BUN 12 01/06/2015 1448   CREATININE 2.96 (H) 10/23/2020 0536   CREATININE 0.88 01/06/2015 1448    CALCIUM 7.0 (L) 10/23/2020 0536   CALCIUM 8.7 01/06/2015 1448   PROT 4.4 (L) 11-20-20 0446   PROT 7.4 03/09/2018 1620   PROT 8.0 01/06/2015 1448   ALBUMIN 1.3 (L) November 20, 2020 0446   ALBUMIN 4.8 03/09/2018 1620   ALBUMIN 4.2 01/06/2015 1448   AST 170 (H) 11-20-20 0446   AST 32 01/06/2015 1448   ALT 99 (H) 2020-11-20 0446   ALT 44 01/06/2015 1448   ALKPHOS 67 November 20, 2020 0446   ALKPHOS 123 (H) 01/06/2015 1448   BILITOT 1.7 (H) 11/20/20 0446   BILITOT 0.6 03/09/2018 1620   BILITOT 0.3 01/06/2015 1448   GFRNONAA 23 (L) 10/23/2020 0536   GFRNONAA >60 01/06/2015 1448   GFRAA 113 03/09/2018 1620   GFRAA >60 01/06/2015 1448   Lipase  No results found for: LIPASE     Studies/Results: DG CHEST PORT 1 VIEW  Result Date: 10/24/2020 CLINICAL DATA:  Left hemothorax EXAM: PORTABLE CHEST 1 VIEW COMPARISON:  10/23/2020 FINDINGS: Endotracheal tube, enteric tube, left chest tube, and right PICC line are unchanged in position. Shallow inspiration. Mild cardiac enlargement. Bilateral perihilar infiltrates are similar. Small residual left pleural effusion with basilar atelectasis. No significant change since previous study. IMPRESSION: Appliances remain in satisfactory position. Persistent bilateral perihilar infiltrates and left pleural effusion with  basilar atelectasis. Electronically Signed   By: Burman Nieves M.D.   On: 10/24/2020 04:28   DG CHEST PORT 1 VIEW  Result Date: 10/23/2020 CLINICAL DATA:  Respiratory failure. EXAM: PORTABLE CHEST 1 VIEW COMPARISON:  10/21/2020 CT 10/21/2020. FINDINGS: Interim placement of left chest tube. Significant reduction left-sided pleural fluid collection. No definite pneumothorax. Endotracheal tube, NG tube, right PICC line stable position. Rib right PICC line tip noted over the right atrium. Heart size stable. Low lung volumes with bibasilar atelectasis/infiltrates. Small right pleural effusion cannot be excluded. Mild left chest wall subcutaneous  emphysema. IMPRESSION: 1. Interim placement of left chest tube. Significant reduction in left-sided pleural fluid collection. No definite pneumothorax. 2.  endotracheal tube, NG tube, right PICC line stable position. 3. Low lung volumes with bibasilar atelectasis/infiltrates. Small right pleural effusion cannot be excluded. Electronically Signed   By: Maisie Fus  Register   On: 10/23/2020 05:37   [place] EKG Site Rite  Result Date: 10/23/2020 If Site Rite image not attached, placement could not be confirmed due to current cardiac rhythm.   Anti-infectives: Anti-infectives (From admission, onward)   Start     Dose/Rate Route Frequency Ordered Stop   10/15/2020 1400  fluconazole (DIFLUCAN) IVPB 200 mg        200 mg 100 mL/hr over 60 Minutes Intravenous Every 24 hours 10/09/2020 1148     10/19/20 1400  piperacillin-tazobactam (ZOSYN) IVPB 3.375 g        3.375 g 12.5 mL/hr over 240 Minutes Intravenous Every 8 hours 10/19/20 1150     10/19/20 0200  vancomycin (VANCOREADY) IVPB 500 mg/100 mL  Status:  Discontinued        500 mg 100 mL/hr over 60 Minutes Intravenous Every 12 hours 10/19/20 0025 10/19/20 0951   10/19/20 0200  metroNIDAZOLE (FLAGYL) IVPB 500 mg  Status:  Discontinued        500 mg 100 mL/hr over 60 Minutes Intravenous Every 8 hours 10/19/20 0045 10/19/20 1150   10/19/20 0115  ceFEPIme (MAXIPIME) 2 g in sodium chloride 0.9 % 100 mL IVPB  Status:  Discontinued        2 g 200 mL/hr over 30 Minutes Intravenous Every 12 hours 10/19/20 0025 10/19/20 1150   10/17/20 1400  Ampicillin-Sulbactam (UNASYN) 3 g in sodium chloride 0.9 % 100 mL IVPB  Status:  Discontinued        3 g 200 mL/hr over 30 Minutes Intravenous Every 6 hours 10/17/20 1207 10/18/20 2325   10/17/20 1400  fluconazole (DIFLUCAN) IVPB 400 mg  Status:  Discontinued        400 mg 100 mL/hr over 120 Minutes Intravenous Every 24 hours 10/17/20 1207 10/04/2020 1148   10/15/20 1630  vancomycin (VANCOREADY) IVPB 500 mg/100 mL  Status:   Discontinued        500 mg 100 mL/hr over 60 Minutes Intravenous Every 12 hours 10/15/20 0940 10/17/20 1205   10/14/20 0400  vancomycin (VANCOREADY) IVPB 500 mg/100 mL  Status:  Discontinued        500 mg 100 mL/hr over 60 Minutes Intravenous Every 24 hours 10/13/20 0233 10/15/20 0940   10/13/20 0330  vancomycin (VANCOREADY) IVPB 750 mg/150 mL        750 mg 150 mL/hr over 60 Minutes Intravenous  Once 10/13/20 0233 10/13/20 0439   10/12/20 1800  ceFEPIme (MAXIPIME) 2 g in sodium chloride 0.9 % 100 mL IVPB  Status:  Discontinued        2 g 200 mL/hr over  30 Minutes Intravenous Every 24 hours 10/11/20 1718 10/11/20 1742   10/12/20 1800  vancomycin (VANCOREADY) IVPB 750 mg/150 mL  Status:  Discontinued        750 mg 150 mL/hr over 60 Minutes Intravenous Every 24 hours 10/11/20 1724 10/11/20 1758   10/12/20 0200  metroNIDAZOLE (FLAGYL) tablet 500 mg  Status:  Discontinued        500 mg Oral Every 8 hours 10/11/20 1715 10/11/20 1758   10/12/20 0030  piperacillin-tazobactam (ZOSYN) IVPB 3.375 g  Status:  Discontinued        3.375 g 12.5 mL/hr over 240 Minutes Intravenous Every 8 hours 10/11/20 1805 10/17/20 1205   10/11/20 1830  piperacillin-tazobactam (ZOSYN) IVPB 3.375 g        3.375 g 100 mL/hr over 30 Minutes Intravenous  Once 10/11/20 1805 10/11/20 1839   10/11/20 1715  ceFEPIme (MAXIPIME) 2 g in sodium chloride 0.9 % 100 mL IVPB  Status:  Discontinued        2 g 200 mL/hr over 30 Minutes Intravenous STAT 10/11/20 1710 10/11/20 1742   10/11/20 1715  metroNIDAZOLE (FLAGYL) IVPB 500 mg  Status:  Discontinued        500 mg 100 mL/hr over 60 Minutes Intravenous STAT 10/11/20 1710 10/11/20 1807   10/11/20 1715  vancomycin (VANCOCIN) IVPB 1000 mg/200 mL premix  Status:  Discontinued        1,000 mg 200 mL/hr over 60 Minutes Intravenous STAT 10/11/20 1710 10/11/20 1804   10/25/2020 1541  ceFAZolin (ANCEF) 2-4 GM/100ML-% IVPB       Note to Pharmacy: [facility]   : cabinet override       10/20/2020 1541 10/06/20 0344       Assessment/Plan RLL PNA Metabolic encephalopathy  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 Will plan UGI vs CT with PO contrast at some point but holding off for now since drain output still appears bilious. At that time if patient appears to have leak will need to consider continuing TPN and NPO vs palliative consult vs surgical intervention understanding that patient is a high risk surgical candidate given recent stroke  New L hemothorax - S/p VATS 11/22 Dr. Cliffton Asters, per TCTS  FEN: NPO, IVF, PICC and TPN  VTE: SCDs, ASA suppository ID: current abx are zosyn and flagyl  LOS: 19 days    Juliet Rude , [facility] 10/24/2020, 8:48 AM Please see Amion for pager number during day hours 7:00am-4:30pm

## 2020-10-24 NOTE — Progress Notes (Signed)
PHARMACY - TOTAL PARENTERAL NUTRITION CONSULT NOTE  Indication:  Perforated duodenum  Patient Measurements: Weight: 59.3 kg (130 lb 11.7 oz)   Body mass index is 20.48 kg/m. Usual Weight: 120-125 lbs  Assessment:  64 YOM presented on 10/16/2020 with right MCA occlusion and underwent revascularization.  PMH significant for PUD post partial gastrectomy in 1990.  Patient was started on a dysphagia diet on 10/08/20 and also transferred out of the ICU.  Post-op course complicated by Afib RVR, aspiration PNA/cellulitis and AKI.  He was then started on TF on 10/12/20, which ended on 10/13/20 when CT showed perinephric abscess with gas formation.  Drain was placed and there is concern for fistulous communication to the colon on 10/16/20 CT.  Nutrition has been inadequate since admission and Pharmacy consulted for TPN management.  Per patient's son, patient has no recent weight loss and was eating a balanced diet PTA.  Glucose / Insulin: no hx DM - CBGs uncontrolled, 50 units SSI yesterday. Electrolytes: K down to 3.6, low CO2 (max acetate in TPN), Phos elevated at 6.3 (Ca x Phos = 58, goal < 55) Renal: SCr 2.96>2.88, BUN up 64  LFTs / TGs:  AST/ALT up slightly to 170/99, tbili 1.7, TG WNL Prealbumin / albumin: albumin 1.3, baseline prealbumin 9.4>8.3 Intake / Output; MIVF: UOP 2.7 ml/kg/hr (given lasix), drain , chest tube , NG 200 GI Imaging:  11/20 CT - large IA fluid and blood in LUQ worrisome for active bleeding, renal cysts, cannot exclude fistulous communication with large bowel  Pending GI study vs CT to r/o further leak before initiating TF  Surgeries / Procedures:  11/20 - intubated, thoracentesis -- drained cloudy, yellow appearing fluid 11/22 - VATS/decortication  Central access: PICC 10/17/20 TPN start date: 10/17/20  Nutritional Goals (per RD rec on 11/23): 1950 - 2240 kCal, 115 - 130 gm protein per day  Current Nutrition:  TPN  Plan:  Adjust TPN to meet  patients goals, increase rate to 90 mL/hr to allow for volume to make Electrolytes in TPN: Na 35mEq/L, re-add K 20 mEq/L, Ca 52mEq/L, Mag 53mEq/L, remove Phos, max acetate Add standard MVI and trace elements to TPN DC SSI and initiate insulin drip - once gets CBG under control can re-add insulin to TPN and SSI Standard TPN labs on Thurs.  Monitor CBGs and I/O's  Elmer Sow, PharmD, BCPS, BCCCP Clinical Pharmacist [phone]  Please check AMION for all [facility] Pharmacy numbers  10/24/2020 9:03 AM

## 2020-10-24 NOTE — Progress Notes (Signed)
ANTICOAGULATION CONSULT NOTE  Pharmacy Consult for Heparin Indication: Afib, s/p CVA  No Known Allergies  Patient Measurements: Weight: 59.3 kg (130 lb 11.7 oz) Heparin Dosing Weight:  56.7 kg  Vital Signs: Temp: 98 F (36.7 C) (11/24 1600) Temp Source: Axillary (11/24 1600) BP: 121/61 (11/24 1800) Pulse Rate: 67 (11/24 1815)  Labs: Recent Labs    November 03, 2020 0446 2020-11-03 0948 2020/11/03 2020 2020-11-03 2020 10/23/20 0536 10/24/20 0406 10/24/20 0750 10/24/20 1700  HGB 8.8*   < > 10.1*   < > 10.6* 9.1*  --   --   HCT 26.9*   < > 31.0*  --  33.2* 28.2*  --   --   PLT 166   < > 155  --  182 152  --   --   LABPROT 15.5*  --   --   --   --   --   --   --   INR 1.3*  --   --   --   --   --   --   --   HEPARINUNFRC  --   --   --   --   --   --   --  <0.10*  CREATININE 2.74*  --   --   --  2.96*  --  2.88*  --    < > = values in this interval not displayed.    Estimated Creatinine Clearance: 21.4 mL/min (A) (by C-G formula based on SCr of 2.88 mg/dL (H)).  Assessment: 69 YOM who presented on 11/5 with R MCA stroke s/p IR + stent placement for reocclusion. Noted to have new onset Afib and pharmacy was consulted to dose heparin. Patient with concern for GIB and L hemothorax now s/p VATS procedure originally holding heparin now pharmacy has been consulted to resume. Aiming for lower goal of 0.3-0.5 d/t R MCA stroke and concern for GIB. CBC improved.  Heparin level undetectable this evening; it is only a 4-hr level.  No issue with infusion nor bleeding per RN.  Goal of Therapy:  Heparin level 0.3 - 0.5 units/ml Monitor platelets by anticoagulation protocol: Yes    Plan:  Increase heparin gtt slightly to 750 units/hr Check 8 hr heparin level  Renezmae Canlas D. Laney Potash, PharmD, BCPS, BCCCP 10/24/2020, 6:43 PM

## 2020-10-24 NOTE — Progress Notes (Signed)
NAME:  Mark May, MRN:  [id number], DOB:  1956-09-05, LOS: 19 ADMISSION DATE:  10/13/2020, CONSULTATION DATE: 10/24/2020 REFERRING MD: Dr. Curtis Sites, CHIEF COMPLAINT: Left-sided weakness  Brief History   64 year old male with HTN and HLD who presented with left-sided weakness, noted to have right MCA occlusion status post thrombectomy and stent placement 11/5.  Hospitalization complicated by hypoxic respiratory failure secondary to pulmonary edema requiring BiPAP and lasix.  Awaiting CIR placement however, on the evening of 11/10, he was noted to become more lethargic with tachycardia and tachypnea.  Had complained of abdominal pain with some distention and chest pain which resolved after burping.  KUB obtained which was normal.  On 11/11, he was somewhat more responsive but now with increasing sCr in which lisinopril was stopped and development of fever 102.6  [facility] initially consulted for help with medical management however on their evaluation, PCCM consulted given ill appearance and developing hypotension and new onset Afib with RVR found to have sepsis with RLL pneumonia likely due to aspiration started on zosyn. PCCM consulted for continue right abdominal pain and distension with concern for bowel perforation secondary to Cortrak.  On 11/13, CT abd/ pelvis showed large right perinephric fluid collection and pockets of air concerning for an infectious process/abscess. Underwent CT guided drain placement of right retroperitoneal abscess.  On 11/15, he developed melena with anemia.  GI consulted with plans to medically management and deferred EGD.  Surgery was consulted on 11/17 with imaging concerning for possible contained bowel perforation.  On 11/20, he developed respiratory distress requiring intubation. Underwent thoracentesis on 11/20.  Hypotensive with Hgb drob on 11/21, CT ABD/Pelvis w/ concern for active RP bleed.  Subsequently, developed hemothorax requiring OR for VATS on 11/22.    Past  Medical History  Hypertension Peptic ulcer disease, previous partial gastrectomy (in [place]/ 1990) Gout BPH Sciatica   Significant Hospital Events   11/05 Mechanical thrombectomy of right MCA and stent 11/5 11/11 PCCM reconsulted  11/12 Cortrak placed 11/14 CT guided drainage for R RP abscess with JP 11/17 PCCM reconsulted 11/18 CCM recalled by rapid response, fever, resp distress, CXR new left infiltrates with left pleural effusion 11/20 Thoracentesis  11/21 Hypotensive with Hgb drop to 6.1 s/p 2 units PRBC, CT ABD with concerning for active bleeding 11/22 To OR for VATS, decortication   Consults:  IR Neurology PCCM 11/5- 11/8; 11/11; 11/16-11/17, 11/18 -  [facility] Urology 11/13 GI 11/15 CCS 11/17  Procedures:  R Femoral Sheath 11/6 >> 11/6 RUE PICC 11/17 >>  ETT 11/20 >>   Significant Diagnostic Tests:  11/5 CT head >> No acute abnormality.  11/5 CTA head / neck >> Severe distal right M1 stenosis or subocclusive thrombus, Right MCA infarct with extensive penumbra, 4 mm right supraclinoid ICA aneurysm. Widely patent cervical carotid and vertebral arteries. 11/5 Postprocedure CT head >> Hypodensity in the right lateral temporal lobe now shows mild hemorrhage or contrast enhancement. This is most consistent with acute infarct. There has been interval stenting of the right middle cerebral artery. 11/6 MRI brain >> Fairly extensive patchy acute/early subacute infarcts within the right MCA/watershed territory.  Additional small acute/early subacute infarcts within the dorsal right thalamus and left basal ganglia. 11/6  MRA head >>  Interval stenting of the M1/M2 right middle cerebral artery. Flow related signal is present proximal and distal to the stent suggestive of stent patency. 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 11/6 TTE >> LVEF 70-75%, no RWMA,  Grade II diastolic dysfunction, elevated LA pressure 11/13 CT ABD/Pelvis >> large right  perinephric fluid collection and pockets of air concerning for an infectious process/abscess. Cholelithiasis. Nonobstructing right renal calculi. No hydronephrosis. Colonic diverticulosis. No bowel obstruction. Partially visualized small bilateral pleural effusions with complete consolidative changes of the visualized lower lobes. 11/16 CT ABD/Pelvis >> interval placement of drainage catheter into the right perinephric space in the area of previously seen gas and fluid collection. Fluid collection has decreased since prior study. There is now contrast material seen within the perinephric space and extending into the right paracolic gutter. This is concerning for possible fistulous communication to the colon. May consider contrast injection through the drainage catheter under fluoroscopic guidance to assess for enteric fistula. Moderate bilateral pleural effusions with bibasilar atelectasis or consolidation, unchanged.  Cholelithiasis.  NG tube in the stomach.  Aortic atherosclerosis.  Small to moderate free fluid in the pelvis. 11/20 L Thoracentesis 650 ml cloudy yellow fluid 11/21 CT abd/ pelvis >> very large area of intra-abdominal fluid and blood within the left upper quadrant, with an appearance worrisome for active bleeding.  Stable position of the right-sided percutaneous drainage catheter with a moderate amount of surrounding contained free air, fluid and inflammatory fat stranding. Fistulous communication with the adjacent portion of large bowel cannot be excluded. Moderate to marked amount of para muscular subcutaneous inflammatory fat stranding along the lateral aspects of the left abdominal and pelvic walls.   Micro Data:  MRSA PCR 11/5 >> negative COVID 11/5 >> negative Flu 11/5 >> negative UC 11/10 >> neg BCx2 11/11 >> neg BC x2 11/13 >> neg Right RRP abscess 11/14 >> moderate candida, klebsiella >>  BCx 2 11/19 >> Left pleural fluid 11/20 >> Klebsiella >>   Antimicrobials:  Cefazolin  11/5 >> 11/6 Flagyl 11/11 x1 Cefepime 11/11, 11/18 >> 11/19  Vanco 11/12 >> 11/16, 11/18  Unasyn 11/17 >> 11/18  Flagyl 11/18 >> 11/19  Diflucan 11/17 >>  Zosyn 11/11 >> 11/17, 11/19 >>   Interim history/subjective:   Patient remains critically ill intubated on mechanical life support status post VATS. Creatinine was climbing, now leveled off slightly improved.. Nephrology was consulted yesterday. TPN continued.  Objective   Blood pressure (!) 141/66, pulse (!) 57, temperature 97.6 F (36.4 C), temperature source Axillary, resp. rate 15, weight 59.3 kg, SpO2 100 %.    Vent Mode: PRVC FiO2 (%):  [30 %] 30 % Set Rate:  [15 bmp] 15 bmp Vt Set:  [520 mL] 520 mL PEEP:  [5 cmH20] 5 cmH20 Plateau Pressure:  [21 cmH20-26 cmH20] 21 cmH20   Intake/Output Summary (Last 24 hours) at 10/24/2020 0716 Last data filed at 10/24/2020 0700 Gross per 24 hour  Intake 2616.51 ml  Output 4480 ml  Net -1863.49 ml   Filed Weights   10/20/20 0500 10/21/20 0424 11/30/2020 0100  Weight: 56.1 kg 56.1 kg 59.3 kg   Physical Exam General: Cachectic gentleman intubated mechanical life support. HEENT: Opens eyes to voice lightly sedated, NG tube in place Neuro: Sedated on mechanical vent CV: Rate rhythm, S1-S2 PULM: Lateral mechanically ventilated breath sounds GI: Soft nontender nondistended, drain in place Extremities: Bilateral lower extremity edema Skin: No rash  Resolved Hospital Problems:  Hypernatremia  Assessment & Plan:   Complex clinical course: admitted with R MCA CVA.   Acute respiratory failure secondary to aspiration pneumonia with bilateral pleural effusion, left klebsiella empyema  Hemothorax following thoracentesis  S/p thora on 11/20 with exudative properties, klebsiella in pleural culture &  RP abscess.  Concern for post thora extravasation on CTA chest  Plan: Continue full vent support Wean FiO2 and PEEP as tolerated Daily SBT SAT Continue Precedex and fentanyl for  sedation PAD protocol Goal RASS zero to -1 As we are able to diurese we will consider liberation from the ventilator.  Mixed Shock - Septic + ABLA / Hypovolemic Multifactorial, ABLA and septic. S/p 2 units blood on 11/21.  Klebsiella Empyema + fluid collection in abd.  Plan: Continue observe for any sign of bleeding Continue antimicrobials, antifungals At this time weaned off of vasopressors.  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 Superior-most edge lines up with distal end of cortrak. Postop care per [facility], TPN per [facility] AKI  Suspect secondary to hypoperfusion with ABLA Appreciate nephrology input. -Kidney function is improving. -We will continue to observe.  PAF with RVR -tele monitoring  Consult pharmacy for restart heparin  Acute R MCA CVA s/p thrombectomy and right MCA stent placement by IR with TICI 3 Left hemiplegia Dysphagia -Continue ASA  Severe Protein Calorie Malnutrition TPN, bowel rest   Best practice:  Diet: bowel rest/ NPO. Continue TPN Pain/Anxiety/Delirium protocol (if indicated):  Fentanyl gtt  VAP protocol (if indicated): Needs DENTAL consult. Oral care protocol DVT prophylaxis: SCDs for now GI prophylaxis: PPI BID Glucose control: SSI Mobility: bed rest Code Status: full code Family Communication: Updated patient's son last night. Disposition:  ICU  LABS    PULMONARY Recent Labs  Lab 10/18/20 2208 10/20/20 0359  PHART 7.532* 7.302*  PCO2ART 22.4* 42.6  PO2ART 56.7* 76*  HCO3 19.0* 21.1  TCO2  --  22  O2SAT 92.3 94.0    CBC Recent Labs  Lab 10/14/2020 2020 10/23/20 0536 10/24/20 0406  HGB 10.1* 10.6* 9.1*  HCT 31.0* 33.2*  28.2*  WBC 19.8* 16.9* 13.8*  PLT 155 182 152    COAGULATION Recent Labs  Lab 10/21/20 0139 10/26/2020 0446  INR 1.7* 1.3*    CARDIAC  No results for input(s): TROPONINI in the last 168 hours. No results for input(s): PROBNP in the last 168 hours.   CHEMISTRY Recent Labs  Lab 10/17/20 0920 10/17/20 0920 10/18/20 0726 10/18/20 0726 10/19/20 0032 10/19/20 0032 10/20/20 0359 10/20/20 0359 10/20/20 [id number] 10/20/20 0632 10/21/20 1737 10/21/20 1737 10/04/2020 0446 10/23/20 0536  NA 147*   < > 148*   < > 148*   < > 149*  --  146*  --  144  --  144 139  K 3.1*   < > 3.6   < > 3.6   < > 3.9   < > 4.0   < > 5.6*   < > 4.7 5.3*  CL 119*   < > 121*   < > 121*  --   --   --  119*  --  118*  --  120* 115*  CO2 19*   < > 19*   < > 20*  --   --   --  19*  --  17*  --  16* 16*  GLUCOSE 158*   < > 210*   < > 130*  --   --   --  341*  --  209*  --  116* 225*  BUN 33*   < >  27*   < > 26*  --   --   --  35*  --  66*  --  61* 64*  CREATININE 1.10   < > 1.07   < > 1.03  --   --   --  1.20  --  2.62*  --  2.74* 2.96*  CALCIUM 7.4*   < > 7.3*   < > 7.4*  --   --   --  7.5*  --  7.3*  --  6.8* 7.0*  MG 2.2  --  2.2  --  2.2  --   --   --  2.1  --   --   --  2.0  --   PHOS 4.0   < > 2.7  --  3.0  --   --   --  3.7  --   --   --  4.9* 6.3*   < > = values in this interval not displayed.   Estimated Creatinine Clearance: 20.9 mL/min (A) (by C-G formula based on SCr of 2.96 mg/dL (H)).   LIVER Recent Labs  Lab 10/18/20 0726 10/21/20 0139 10/21/20 0627 10/21/20 1737 10/04/2020 0446  AST 38  --   --  144* 170*  ALT 44  --   --  80* 99*  ALKPHOS 142*  --   --  70 67  BILITOT 1.1  --   --  1.6* 1.7*  PROT 5.1*  --  3.7* 5.0* 4.4*  ALBUMIN 1.3*  --   --  1.5* 1.3*  INR  --  1.7*  --   --  1.3*    INFECTIOUS Recent Labs  Lab 10/18/20 2258 10/19/20 0032 10/20/20 0632  LATICACIDVEN 1.6 1.5  --   PROCALCITON  --  1.38 1.96    ENDOCRINE CBG (last 3)  Recent Labs    10/23/20 1948  10/23/20 2344 10/24/20 0341  GLUCAP 308* 314* 275*     This patient is critically ill with multiple organ system failure; which, requires frequent high complexity decision making, assessment, support, evaluation, and titration of therapies. This was completed through the application of advanced monitoring technologies and extensive interpretation of multiple databases. During this encounter critical care time was devoted to patient care services described in this note for 32 minutes.  Josephine Igo, DO [facility] 10/24/2020 7:16 AM

## 2020-10-24 NOTE — Progress Notes (Addendum)
      [address]       [place] [address]             [phone]      2 Days Post-Op Procedure(s) (LRB): VIDEO ASSISTED THORACOSCOPY (VATS)/DECORTICATION (Left) Subjective: Sedate and mechanically ventilated.   Objective: Vital signs in last 24 hours: Temp:  [97.6 F (36.4 C)-99.8 F (37.7 C)] 97.6 F (36.4 C) (11/24 0343) Pulse Rate:  [57-87] 57 (11/24 0600) Cardiac Rhythm: Normal sinus rhythm (11/24 0600) Resp:  [14-18] 15 (11/24 0700) BP: (83-151)/(55-80) 141/66 (11/24 0600) SpO2:  [99 %-100 %] 100 % (11/24 0600) Arterial Line BP: (97-157)/(38-61) 107/44 (11/24 0700) FiO2 (%):  [30 %] 30 % (11/24 0500)     Intake/Output from previous day: 11/23 0701 - 11/24 0700 In: 2616.5 [I.V.:2334.7; IV Piggyback:281.8] Out: 4480 [Urine:3800; Emesis/NG output:200; Drains:110; Chest Tube:370] Intake/Output this shift: No intake/output data recorded.  General appearance: Sedated and mechanically ventilated. Not responsive. Heart: RRR Lungs: CXR is stable. CT drained past 24 hours. No air leak.  Wound: Left chest dressing is dry and well secured.  Lab Results: Recent Labs    10/23/20 0536 10/24/20 0406  WBC 16.9* 13.8*  HGB 10.6* 9.1*  HCT 33.2* 28.2*  PLT 182 152   BMET:  Recent Labs    10/03/2020 0446 10/23/20 0536  NA 144 139  K 4.7 5.3*  CL 120* 115*  CO2 16* 16*  GLUCOSE 116* 225*  BUN 61* 64*  CREATININE 2.74* 2.96*  CALCIUM 6.8* 7.0*    PT/INR:  Recent Labs    10/26/2020 0446  LABPROT 15.5*  INR 1.3*   ABG    Component Value Date/Time   PHART 7.302 (L) 10/20/2020 0359   HCO3 21.1 10/20/2020 0359   TCO2 22 10/20/2020 0359   ACIDBASEDEF 5.0 (H) 10/20/2020 0359   O2SAT 94.0 10/20/2020 0359   CBG (last 3)  Recent Labs    10/23/20 1948 10/23/20 2344 10/24/20 0341  GLUCAP 308* 314* 275*    Assessment/Plan: S/P Procedure(s) (LRB): VIDEO ASSISTED THORACOSCOPY (VATS)/DECORTICATION (Left)  -POD-2 left VATS, drainage of  effusion and decortication. No air leak but has significant drainage. Will leave CT in place until extubated. Klebsiella cx'd from the pleural fluid obtained by left thoracentesis 4 days ago. ABX per primary team. Will continue to follow.    LOS: 19 days    Leary Roca, PA-C [phone] 10/24/2020  378 chest tube output.  Serosanguineous We will keep drains in until patient is extubated.  Jemimah Cressy Keane Scrape

## 2020-10-24 NOTE — Progress Notes (Signed)
Referring Physician(s): Levon HedgerSmith, Daniel  Supervising Physician: Oley BalmHassell, Daniel  Patient Status:  [facility] - [facility]  Chief Complaint: Follow up left perinephric abscess drain placed 10/14/20 in IR  Subjective:  Patient sedated, ventilated. No family/staff at bedside.  Allergies: Patient has no known allergies.  Medications: Prior to Admission medications   Medication Sig Start Date End Date Taking? Authorizing Provider  allopurinol (ZYLOPRIM) 100 MG tablet Take 3 tablets (300 mg total) by mouth daily. Patient not taking: Reported on 10/09/2020 02/17/18   Reubin MilanBerglund, Laura H, MD  amLODipine (NORVASC) 10 MG tablet Take 1 tablet (10 mg total) by mouth daily. Patient not taking: Reported on 10/09/2020 02/17/18   Reubin MilanBerglund, Laura H, MD  tamsulosin [facility](FLOMAX) 0.4 MG CAPS capsule Take 1 capsule (0.4 mg total) by mouth daily after supper. Patient not taking: Reported on 10/09/2020 03/09/18   Reubin MilanBerglund, Laura H, MD     Vital Signs: BP (!) 107/56   Pulse (!) 56   Temp 97.6 F (36.4 C) (Axillary)   Resp 15   Wt 130 lb 11.7 oz (59.3 kg)   SpO2 100%   BMI 20.48 kg/m   Physical Exam Vitals and nursing note reviewed.  Constitutional:      Comments: Sedated, ventilated.  Cardiovascular:     Rate and Rhythm: Bradycardia present.  Pulmonary:     Comments: Venitlated Abdominal:     Comments: (+) right perinephric drain to suction with tan output in bulb. Insertion site unremarkable.  Skin:    General: Skin is warm and dry.     Imaging: CT ABDOMEN PELVIS WO CONTRAST  Result Date: 10/21/2020 CLINICAL DATA:  Abdominal pain and abdominal distension. EXAM: CT ABDOMEN AND PELVIS WITHOUT CONTRAST TECHNIQUE: Multidetector CT imaging of the abdomen and pelvis was performed following the standard protocol without IV contrast. COMPARISON:  October 16, 2020 FINDINGS: Lower chest: Moderate severity areas of consolidation are seen within the bilateral lung bases. There is a large left pleural effusion with  suspected loculated components. This is increased in size when compared to the prior study. A small to moderate sized right pleural effusion is also noted. Hepatobiliary: No focal liver abnormality is seen. Numerous subcentimeter gallstones are seen within the lumen of a normal appearing gallbladder. There is no evidence of biliary dilatation. Pancreas: Unremarkable. No pancreatic ductal dilatation or surrounding inflammatory changes. Spleen: The spleen is displaced within the anteromedial aspect of the left upper quadrant the. A 15.5 cm x 11.3 cm x 16.9 cm area containing a mixture of hemorrhagic and non hemorrhagic fluid is seen within the left upper quadrant. This appears to be retroperitoneal in location and represents a new finding when compared to the prior exam. Adrenals/Urinary Tract: Adrenal glands are unremarkable. Kidneys are normal in size. Multiple stable bilateral renal cysts of various sizes are seen. Multiple 2 mm, 3 mm and 4 mm nonobstructing renal stones are seen throughout the right kidney. A mild amount of air is again seen within the lumen of the urinary bladder. Stomach/Bowel: A nasogastric tube is seen with its distal tip noted within the body of the stomach. The appendix is not clearly identified. No evidence of bowel dilatation. A 2.1 cm x 1.3 cm area of oral contrast is seen adjacent to the posteromedial aspect of the cecum (axial CT image 69, CT series number 3). This is present on the prior study. Vascular/Lymphatic: There is moderate severity calcification of the abdominal aorta and bilateral common iliac arteries, without evidence of aneurysmal dilatation. No enlarged  abdominal or pelvic lymph nodes. Reproductive: Prostate is unremarkable. Other: A percutaneous drainage catheter is again seen entering via the right flank. Its distal tip is noted along the posterior aspect of the right lower quadrant and is unchanged in position when compared to the prior study. Numerous foci of moderate  severity contained free air are again seen within this region. Persistent surrounding mesenteric inflammatory fat stranding and mesenteric fluid is also noted. A small amount of posterior pelvic free fluid is seen. This is stable in appearance when compared to the prior study. Musculoskeletal: A moderate to marked amount of para muscular subcutaneous inflammatory fat stranding is seen along the lateral aspects of the left abdominal and pelvic walls. No acute or significant osseous findings. IMPRESSION: 1. Very large area of intra-abdominal fluid and blood within the left upper quadrant, with an appearance worrisome for active bleeding. 2. Large left pleural effusion with a small to moderate sized right pleural effusion. 3. Moderate severity bibasilar consolidation which may represent atelectasis and/or pneumonia. 4. Cholelithiasis. 5. Multiple stable bilateral renal cysts of various sizes. 6. Stable position of the right-sided percutaneous drainage catheter with a moderate amount of surrounding contained free air, fluid and inflammatory fat stranding. Fistulous communication with the adjacent portion of large bowel cannot be excluded. 7. Moderate to marked amount of para muscular subcutaneous inflammatory fat stranding along the lateral aspects of the left abdominal and pelvic walls. 8. Moderate to marked amount of para muscular. 9. Aortic atherosclerosis. Aortic Atherosclerosis (ICD10-I70.0). Electronically Signed   By: Aram Candela M.D.   On: 10/21/2020 01:53   CT CHEST W CONTRAST  Result Date: 10/21/2020 CLINICAL DATA:  Dropping hematocrit, large hemothorax following thoracentesis yesterday EXAM: CT CHEST, ABDOMEN, AND PELVIS WITH CONTRAST TECHNIQUE: Multidetector CT imaging of the chest, abdomen and pelvis was performed following the standard protocol during bolus administration of intravenous contrast. CONTRAST:  OMNIPAQUE IOHEXOL 300 MG/ML  SOLN COMPARISON:  CT abdomen pelvis, 10/21/2020, 1:09  a.m. FINDINGS: CT CHEST FINDINGS Cardiovascular: Aortic atherosclerosis. Right upper extremity PICC, tip near the superior cavoatrial junction. Normal heart size. No pericardial effusion. Mediastinum/Nodes: No enlarged mediastinal, hilar, or axillary lymph nodes. Thyroid is normal. Endotracheal and esophagogastric intubation. Lungs/Pleura: Large left, moderate right pleural effusions and associated atelectasis or consolidation. There is extensive, heterogeneous high density in the inferior aspect of the left pleural effusion, consistent with a large hematoma, given relatively circumscribed appearing appears to be extrapleural and measures approximately 15.3 x 12.1 x 8.5 cm (series 6, image 41). There appears to be a small focus of contrast extravasation adjacent to the inferior margin of the posterolateral left tenth rib (series 6, image 64). Musculoskeletal: No chest wall mass or suspicious bone lesions identified. CT ABDOMEN PELVIS FINDINGS Hepatobiliary: No solid liver abnormality is seen. Gallstones and sludge in the gallbladder. No gallbladder wall thickening, or biliary dilatation. Pancreas: Unremarkable. No pancreatic ductal dilatation or surrounding inflammatory changes. Spleen: Normal in size without significant abnormality. Adrenals/Urinary Tract: Adrenal glands are unremarkable. Multiple nonobstructive right renal calculi. No left-sided calculi, ureteral calculi, or hydronephrosis. Small air-fluid level within the urinary bladder, similar to prior. Stomach/Bowel: Stomach is within normal limits. Appendix is not clearly visualized. No evidence of bowel wall thickening, distention, or inflammatory changes. Vascular/Lymphatic: Aortic atherosclerosis. No enlarged abdominal or pelvic lymph nodes. Reproductive: No mass or other abnormality. Other: Anasarca. Redemonstrated percutaneous pigtail drainage catheter positioned in a right retroperitoneal air and fluid collection, not significantly changed compared to  prior examination (series 3, image 79).  Small volume fluid attenuation ascites in the low pelvis (series 3, image 105). Musculoskeletal: No acute or significant osseous findings. IMPRESSION: 1. There is a large left pleural effusion, which contains a circumscribed appearing region of heterogeneous high density inferiorly measuring approximately 15.3 x 12.1 x 8.5 cm. This is most consistent with a large extrapleural hematoma and there appears to be a small focus of contrast extravasation adjacent to the inferior margin of the posterolateral left tenth rib, most consistent with an intercostal artery extravasation following thoracentesis. This is not significantly changed compared to prior same day noncontrast examination. 2. Moderate right pleural effusion. 3. Redemonstrated percutaneous pigtail drainage catheter positioned in a right retroperitoneal air and fluid collection, not significantly changed compared to prior examination. 4. Small volume fluid attenuation ascites in the low pelvis. 5. Anasarca. 6. Other chronic and incidental findings as above. Aortic Atherosclerosis (ICD10-I70.0). Findings discussed by telephone with Dr. Barton Fanny at 1:15 p.m., 10/21/2020. Electronically Signed   By: Lauralyn Primes M.D.   On: 10/21/2020 14:01   CT ABDOMEN PELVIS W CONTRAST  Result Date: 10/21/2020 CLINICAL DATA:  Dropping hematocrit, large hemothorax following thoracentesis yesterday EXAM: CT CHEST, ABDOMEN, AND PELVIS WITH CONTRAST TECHNIQUE: Multidetector CT imaging of the chest, abdomen and pelvis was performed following the standard protocol during bolus administration of intravenous contrast. CONTRAST:  OMNIPAQUE IOHEXOL 300 MG/ML  SOLN COMPARISON:  CT abdomen pelvis, 10/21/2020, 1:09 a.m. FINDINGS: CT CHEST FINDINGS Cardiovascular: Aortic atherosclerosis. Right upper extremity PICC, tip near the superior cavoatrial junction. Normal heart size. No pericardial effusion. Mediastinum/Nodes: No enlarged mediastinal,  hilar, or axillary lymph nodes. Thyroid is normal. Endotracheal and esophagogastric intubation. Lungs/Pleura: Large left, moderate right pleural effusions and associated atelectasis or consolidation. There is extensive, heterogeneous high density in the inferior aspect of the left pleural effusion, consistent with a large hematoma, given relatively circumscribed appearing appears to be extrapleural and measures approximately 15.3 x 12.1 x 8.5 cm (series 6, image 41). There appears to be a small focus of contrast extravasation adjacent to the inferior margin of the posterolateral left tenth rib (series 6, image 64). Musculoskeletal: No chest wall mass or suspicious bone lesions identified. CT ABDOMEN PELVIS FINDINGS Hepatobiliary: No solid liver abnormality is seen. Gallstones and sludge in the gallbladder. No gallbladder wall thickening, or biliary dilatation. Pancreas: Unremarkable. No pancreatic ductal dilatation or surrounding inflammatory changes. Spleen: Normal in size without significant abnormality. Adrenals/Urinary Tract: Adrenal glands are unremarkable. Multiple nonobstructive right renal calculi. No left-sided calculi, ureteral calculi, or hydronephrosis. Small air-fluid level within the urinary bladder, similar to prior. Stomach/Bowel: Stomach is within normal limits. Appendix is not clearly visualized. No evidence of bowel wall thickening, distention, or inflammatory changes. Vascular/Lymphatic: Aortic atherosclerosis. No enlarged abdominal or pelvic lymph nodes. Reproductive: No mass or other abnormality. Other: Anasarca. Redemonstrated percutaneous pigtail drainage catheter positioned in a right retroperitoneal air and fluid collection, not significantly changed compared to prior examination (series 3, image 79). Small volume fluid attenuation ascites in the low pelvis (series 3, image 105). Musculoskeletal: No acute or significant osseous findings. IMPRESSION: 1. There is a large left pleural  effusion, which contains a circumscribed appearing region of heterogeneous high density inferiorly measuring approximately 15.3 x 12.1 x 8.5 cm. This is most consistent with a large extrapleural hematoma and there appears to be a small focus of contrast extravasation adjacent to the inferior margin of the posterolateral left tenth rib, most consistent with an intercostal artery extravasation following thoracentesis. This is not significantly changed compared to  prior same day noncontrast examination. 2. Moderate right pleural effusion. 3. Redemonstrated percutaneous pigtail drainage catheter positioned in a right retroperitoneal air and fluid collection, not significantly changed compared to prior examination. 4. Small volume fluid attenuation ascites in the low pelvis. 5. Anasarca. 6. Other chronic and incidental findings as above. Aortic Atherosclerosis (ICD10-I70.0). Findings discussed by telephone with Dr. Barton Fanny at 1:15 p.m., 10/21/2020. Electronically Signed   By: Lauralyn Primes M.D.   On: 10/21/2020 14:01   DG CHEST PORT 1 VIEW  Result Date: 10/24/2020 CLINICAL DATA:  Left hemothorax EXAM: PORTABLE CHEST 1 VIEW COMPARISON:  10/23/2020 FINDINGS: Endotracheal tube, enteric tube, left chest tube, and right PICC line are unchanged in position. Shallow inspiration. Mild cardiac enlargement. Bilateral perihilar infiltrates are similar. Small residual left pleural effusion with basilar atelectasis. No significant change since previous study. IMPRESSION: Appliances remain in satisfactory position. Persistent bilateral perihilar infiltrates and left pleural effusion with basilar atelectasis. Electronically Signed   By: Burman Nieves M.D.   On: 10/24/2020 04:28   DG CHEST PORT 1 VIEW  Result Date: 10/23/2020 CLINICAL DATA:  Respiratory failure. EXAM: PORTABLE CHEST 1 VIEW COMPARISON:  10/21/2020 CT 10/21/2020. FINDINGS: Interim placement of left chest tube. Significant reduction left-sided pleural fluid  collection. No definite pneumothorax. Endotracheal tube, NG tube, right PICC line stable position. Rib right PICC line tip noted over the right atrium. Heart size stable. Low lung volumes with bibasilar atelectasis/infiltrates. Small right pleural effusion cannot be excluded. Mild left chest wall subcutaneous emphysema. IMPRESSION: 1. Interim placement of left chest tube. Significant reduction in left-sided pleural fluid collection. No definite pneumothorax. 2.  endotracheal tube, NG tube, right PICC line stable position. 3. Low lung volumes with bibasilar atelectasis/infiltrates. Small right pleural effusion cannot be excluded. Electronically Signed   By: Maisie Fus  Register   On: 10/23/2020 05:37   DG Chest Port 1 View  Result Date: 10/21/2020 CLINICAL DATA:  Respiratory failure EXAM: PORTABLE CHEST 1 VIEW COMPARISON:  None. FINDINGS: AE moderate to large left-sided pleural effusion with underlying opacity is stable. The ETT is stable in good position. The OG tube terminates below today's film. No pneumothorax. No change in the cardiomediastinal silhouette. The right mid lung opacity seen previously has resolved. Mild opacity in the right base is probably atelectasis. The right PICC line terminates in the right atrium, unchanged. No other acute abnormalities. IMPRESSION: 1. Support apparatus as above. 2. Persistent moderate to large left effusion. 3. Resolution of right midlung opacity. 4. Mild opacity in the right base is probably atelectasis. Electronically Signed   By: Gerome Sam III M.D   On: 10/21/2020 09:37   DG Chest Port 1 View  Result Date: 10/20/2020 CLINICAL DATA:  Post thoracentesis EXAM: PORTABLE CHEST 1 VIEW COMPARISON:  Radiograph 10/20/2020 FINDINGS: Endotracheal tube tip terminates 2.5 cm from the carina. Transesophageal tube tip and side port distal to the GE junction. Right upper extremity PICC terminates at the right atrium. Telemetry leads overlie the chest. Slight interval  decrease in the size of a left pleural effusion with some lobular margins likely reflecting loculation. More coalescent retrocardiac opacity could reflect a combination of layering pleural fluid, atelectasis and/or airspace disease. Increasing coalescence of opacity seen in the right mid and lower lung with a trace right effusion as well. No visible pneumothorax. The aorta is calcified. The remaining cardiomediastinal contours are unremarkable. No acute osseous or soft tissue abnormality. Degenerative changes are present in the imaged spine and shoulders. IMPRESSION: 1. Slight interval decrease in  size of a left pleural effusion with some lobular margins which could reflect loculation. 2. Slightly increased coalescent retrocardiac and right mid and lower lung opacities, could reflect a combination of layering pleural fluid, atelectasis and/or airspace disease. 3. No pneumothorax. 4. Lines and tubes as above. 5.  Aortic Atherosclerosis (ICD10-I70.0). Electronically Signed   By: Kreg Shropshire M.D.   On: 10/20/2020 19:50   [place] EKG Site Rite  Result Date: 10/23/2020 If Site Rite image not attached, placement could not be confirmed due to current cardiac rhythm.   Labs:  CBC: Recent Labs    10/16/2020 0948 10/18/2020 2020 10/23/20 0536 10/24/20 0406  WBC 20.1* 19.8* 16.9* 13.8*  HGB 9.4* 10.1* 10.6* 9.1*  HCT 29.1* 31.0* 33.2* 28.2*  PLT 207 155 182 152    COAGS: Recent Labs    10/15/2020 1246 10/11/20 1706 10/21/20 0139 10/30/2020 0446  INR 0.9 1.1 1.7* 1.3*  APTT 25 41* 37*  --     BMP: Recent Labs    10/21/20 1737 10/01/2020 0446 10/23/20 0536 10/24/20 0750  NA 144 144 139 142  K 5.6* 4.7 5.3* 3.6  CL 118* 120* 115* 110  CO2 17* 16* 16* 19*  GLUCOSE 209* 116* 225* 224*  BUN 66* 61* 64* 73*  CALCIUM 7.3* 6.8* 7.0* 7.1*  CREATININE 2.62* 2.74* 2.96* 2.88*  GFRNONAA 26* 25* 23* 23*    LIVER FUNCTION TESTS: Recent Labs    10/15/20 0408 10/15/20 0408 10/18/20 0726 10/21/20 0627  10/21/20 1737 10/11/2020 0446  BILITOT 1.3*  --  1.1  --  1.6* 1.7*  AST 134*  --  38  --  144* 170*  ALT 70*  --  44  --  80* 99*  ALKPHOS 200*  --  142*  --  70 67  PROT 5.1*   < > 5.1* 3.7* 5.0* 4.4*  ALBUMIN 1.3*  --  1.3*  --  1.5* 1.3*   < > = values in this interval not displayed.    Assessment and Plan:  64 y/o M with history right perinephric abscess s/p drain placement 10/14/20 in IR seen today for routine drain follow up.  Drain output remains tan, no concerns noted with flushing. Per I/O 110 cc output in last 24H.   Continue TID flushes with 5 cc NS, record output Qshift, dressing changes QD or PRN if soiled, call IR if difficulty flushing or sudden decrease in output.   Plan for repeat CT/possible injection once output <10 mL/QD (excluding flush) - if patient is to be discharged prior to this occurring they will need to follow up with IR clinic as an outpatient typically 10-14 days post d/c, IR scheduler will call patient with date/time. Upon d/c drain to be flushed QD with 5 cc NS (will need rx from primary team at d/c), record output QD, dressing changes every 2-3 days or sooner if soiled.   IR will continue to follow - please call with questions or concerns.  Electronically Signed: Villa Herb, PA-C 10/24/2020, 10:27 AM   I spent a total of 15 Minutes at the the patient's bedside AND on the patient's hospital floor or unit, greater than 50% of which was counseling/coordinating care for right perinephric drain follow up.

## 2020-10-25 DIAGNOSIS — R0603 Acute respiratory distress: Secondary | ICD-10-CM | POA: Diagnosis not present

## 2020-10-25 DIAGNOSIS — I6601 Occlusion and stenosis of right middle cerebral artery: Secondary | ICD-10-CM | POA: Diagnosis not present

## 2020-10-25 LAB — COMPREHENSIVE METABOLIC PANEL
ALT: 201 U/L — ABNORMAL HIGH (ref 0–44)
ALT: 184 U/L — ABNORMAL HIGH (ref 0–44)
AST: 126 U/L — ABNORMAL HIGH (ref 15–41)
AST: 103 U/L — ABNORMAL HIGH (ref 15–41)
Albumin: 1.1 g/dL — ABNORMAL LOW (ref 3.5–5.0)
Albumin: 1.1 g/dL — ABNORMAL LOW (ref 3.5–5.0)
Alkaline Phosphatase: 74 U/L (ref 38–126)
Alkaline Phosphatase: 83 U/L (ref 38–126)
Anion gap: 13 (ref 5–15)
Anion gap: 12 (ref 5–15)
BUN: 72 mg/dL — ABNORMAL HIGH (ref 8–23)
BUN: 77 mg/dL — ABNORMAL HIGH (ref 8–23)
CO2: 21 mmol/L — ABNORMAL LOW (ref 22–32)
CO2: 21 mmol/L — ABNORMAL LOW (ref 22–32)
Calcium: 7.6 mg/dL — ABNORMAL LOW (ref 8.9–10.3)
Calcium: 7.5 mg/dL — ABNORMAL LOW (ref 8.9–10.3)
Chloride: 105 mmol/L (ref 98–111)
Chloride: 109 mmol/L (ref 98–111)
Creatinine, Ser: 2.56 mg/dL — ABNORMAL HIGH (ref 0.61–1.24)
Creatinine, Ser: 2.67 mg/dL — ABNORMAL HIGH (ref 0.61–1.24)
GFR, Estimated: 27 mL/min — ABNORMAL LOW (ref >60)
GFR, Estimated: 26 mL/min — ABNORMAL LOW (ref >60)
Glucose, Bld: 563 mg/dL (ref 70–99)
Glucose, Bld: 130 mg/dL — ABNORMAL HIGH (ref 70–99)
Potassium: 4.1 mmol/L (ref 3.5–5.1)
Potassium: 3.5 mmol/L (ref 3.5–5.1)
Sodium: 139 mmol/L (ref 135–145)
Sodium: 142 mmol/L (ref 135–145)
Total Bilirubin: 1.7 mg/dL — ABNORMAL HIGH (ref 0.3–1.2)
Total Bilirubin: 2.1 mg/dL — ABNORMAL HIGH (ref 0.3–1.2)
Total Protein: 4.6 g/dL — ABNORMAL LOW (ref 6.5–8.1)
Total Protein: 5.1 g/dL — ABNORMAL LOW (ref 6.5–8.1)

## 2020-10-25 LAB — GLUCOSE, CAPILLARY
Glucose-Capillary: 113 mg/dL — ABNORMAL HIGH (ref 70–99)
Glucose-Capillary: 117 mg/dL — ABNORMAL HIGH (ref 70–99)
Glucose-Capillary: 119 mg/dL — ABNORMAL HIGH (ref 70–99)
Glucose-Capillary: 142 mg/dL — ABNORMAL HIGH (ref 70–99)
Glucose-Capillary: 143 mg/dL — ABNORMAL HIGH (ref 70–99)
Glucose-Capillary: 153 mg/dL — ABNORMAL HIGH (ref 70–99)

## 2020-10-25 LAB — CBC
HCT: 26.3 % — ABNORMAL LOW (ref 39.0–52.0)
Hemoglobin: 8.4 g/dL — ABNORMAL LOW (ref 13.0–17.0)
MCH: 28.3 pg (ref 26.0–34.0)
MCHC: 31.9 g/dL (ref 30.0–36.0)
MCV: 88.6 fL (ref 80.0–100.0)
Platelets: 154 10*3/uL (ref 150–400)
RBC: 2.97 MIL/uL — ABNORMAL LOW (ref 4.22–5.81)
RDW: 18.4 % — ABNORMAL HIGH (ref 11.5–15.5)
WBC: 12.7 10*3/uL — ABNORMAL HIGH (ref 4.0–10.5)
nRBC: 0.2 % (ref 0.0–0.2)

## 2020-10-25 LAB — PHOSPHORUS
Phosphorus: 2.2 mg/dL — ABNORMAL LOW (ref 2.5–4.6)
Phosphorus: 2.4 mg/dL — ABNORMAL LOW (ref 2.5–4.6)

## 2020-10-25 LAB — URINALYSIS, COMPLETE (UACMP) WITH MICROSCOPIC
Bilirubin Urine: NEGATIVE
Glucose, UA: NEGATIVE mg/dL
Ketones, ur: NEGATIVE mg/dL
Leukocytes,Ua: NEGATIVE
Nitrite: NEGATIVE
Protein, ur: NEGATIVE mg/dL
Specific Gravity, Urine: 1.008 (ref 1.005–1.030)
pH: 5 (ref 5.0–8.0)

## 2020-10-25 LAB — HEPARIN LEVEL (UNFRACTIONATED)
Heparin Unfractionated: <0.1 IU/mL — ABNORMAL LOW (ref 0.30–0.70)
Heparin Unfractionated: <0.1 IU/mL — ABNORMAL LOW (ref 0.30–0.70)
Heparin Unfractionated: <0.1 IU/mL — ABNORMAL LOW (ref 0.30–0.70)

## 2020-10-25 LAB — MAGNESIUM
Magnesium: 2.3 mg/dL (ref 1.7–2.4)
Magnesium: 2.1 mg/dL (ref 1.7–2.4)

## 2020-10-25 MED ORDER — TRAVASOL 10 % IV SOLN
INTRAVENOUS | Status: AC
  Filled 2020-10-25: qty 1220.4

## 2020-10-25 MED ORDER — FUROSEMIDE 10 MG/ML IJ SOLN
60.0000 mg | Freq: Once | INTRAMUSCULAR | Status: AC
  Administered 2020-10-25: 60 mg via INTRAVENOUS
  Filled 2020-10-25: qty 6

## 2020-10-25 NOTE — Progress Notes (Signed)
ANTICOAGULATION CONSULT NOTE - Follow Up Consult  Pharmacy Consult for heparin Indication: Afib in setting of recent CVA  Labs: Recent Labs    11-14-20 0446 14-Nov-2020 0948 11/14/2020 2020 11/14/2020 2020 10/23/20 0536 10/24/20 0406 10/24/20 0750 10/24/20 1700 10/25/20 0213  HGB 8.8*   < > 10.1*   < > 10.6* 9.1*  --   --   --   HCT 26.9*   < > 31.0*  --  33.2* 28.2*  --   --   --   PLT 166   < > 155  --  182 152  --   --   --   LABPROT 15.5*  --   --   --   --   --   --   --   --   INR 1.3*  --   --   --   --   --   --   --   --   HEPARINUNFRC  --   --   --   --   --   --   --  <0.10* <0.10*  CREATININE 2.74*  --   --   --  2.96*  --  2.88*  --   --    < > = values in this interval not displayed.    Assessment: 64yo male remains subtherapeutic on heparin after rate change; no gtt issues or signs of bleeding per RN.  Goal of Therapy:  Heparin level 0.3-0.5 units/ml   Plan:  Will increase heparin gtt by 2-3 units/kg/hr to 900 units/hr and check level in 8 hours.    Vernard Gambles, PharmD, BCPS  10/25/2020,3:34 AM

## 2020-10-25 NOTE — Progress Notes (Signed)
PHARMACY - TOTAL PARENTERAL NUTRITION CONSULT NOTE  Indication: Perforated duodenum  Patient Measurements: Weight: 59.3 kg (130 lb 11.7 oz)   Body mass index is 20.48 kg/m. Usual Weight: 120-125 lbs  Assessment:  64 YOM presented on 10/21/2020 with right MCA occlusion and underwent revascularization. PMH significant for PUD post partial gastrectomy in 1990. Patient was started on a dysphagia diet on 10/08/20 and also transferred out of the ICU. Post-op course complicated by Afib RVR, aspiration PNA/cellulitis and AKI. He was then started on TF on 10/12/20, which ended on 10/13/20 when CT showed perinephric abscess with gas formation. Drain was placed and there is concern for fistulous communication to the colon on 10/16/20 CT. Nutrition has been inadequate since admission, and Pharmacy consulted for TPN management. Per patient's son, patient has no recent weight loss and was eating a balanced diet PTA.  11/25 - Patient off vasopressors and on minimal sedation. May consider extubation today per notes.  Glucose / Insulin: no hx DM - CBGs now controlled (563 reading on bmet is contaminant). Levemir 10 units BID added 11/24 + utilized 27 units Novolog total yesterday (Novolog 3 units q4h + SSI). Insulin drip d/c'd 11/24 PM Electrolytes: K down to 3.5, CO2 up to 21 (max acetate in TPN), Phos previously high (removed from TPN), now slightly low 2.4; others stable NWL Renal: SCr trend down to 2.67, BUN up to 77  LFTs / TGs:  AST down to 103 / ALT up to 184, Tbili up to 2.1, TG normalized on 11/22 Prealbumin / albumin: albumin 1.1, prealbumin 9.4>8.2 Intake / Output; MIVF: UOP 1.2 ml/kg/hr, drain output , chest tube output , NGT output not recorded last 24hrs; net +6.4L this admit (lasix 60mg  IV x 1 scheduled today) GI Imaging: 11/20 CT - large IA fluid and blood in LUQ worrisome for active bleeding, renal cysts, cannot exclude fistulous communication with large bowel  Pending GI study vs CT to  r/o further leak before initiating TF  Surgeries / Procedures: 11/20 - intubated, thoracentesis -- drained 12/20 cloudy, yellow appearing fluid 11/22 - VATS/decortication  Central access: PICC 10/17/20 TPN start date: 10/17/20  Nutritional Goals (per RD rec on 11/23): 1950 - 2240 kCal, 115 - 130g protein; >1.8L fluid per day  Goal TPN rate is 90 ml/hr - will provide 122g protein, 292g CHO, and 67g SMOF lipids, for total 2150 kCal  Current Nutrition:  TPN; NPO  Plan:  Continue TPN at goal rate 90 mL/hr at 1800 Electrolytes in TPN: add back Phos 5 mmol/L; otherwise, continue same today - Na 13mEq/L, K 20 mEq/L re-added on 11/24, Ca 30mEq/L, Mag 75mEq/L, max acetate Add standard MVI and trace elements to TPN D/c Levemir and Novolog 3 units q4h coverage (already received AM doses). Continue ICU resistant scale SSI + add 25 units regular insulin to TPN bag and adjust as needed Monitor Surgery plans, TPN labs, I/O's   4m, PharmD, BCPS Please check AMION for all [facility] contact numbers Clinical Pharmacist 10/25/2020 8:17 AM

## 2020-10-25 NOTE — Progress Notes (Signed)
Progress Note  3 Days Post-Op  Subjective: No acute events. Diuresing and weaning vent.    Objective: Vital signs in last 24 hours: Temp:  [97.9 F (36.6 C)-100.1 F (37.8 C)] 99.7 F (37.6 C) (11/25 0337) Pulse Rate:  [53-90] 80 (11/25 0731) Resp:  [13-20] 17 (11/25 0731) BP: (101-176)/(54-80) 140/63 (11/25 0731) SpO2:  [90 %-100 %] 100 % (11/25 0732) Arterial Line BP: (100-183)/(46-69) 149/54 (11/24 1900) FiO2 (%):  [30 %] 30 % (11/25 0732) Weight:  [59.3 kg] 59.3 kg (11/25 0340) Last BM Date: 10/20/20  Intake/Output from previous day: 11/24 0701 - 11/25 0700 In: 2881.9 [I.V.:2556.8; IV Piggyback:325.1] Out: 2350 [Urine:1765; Emesis/NG output:250; Drains:85; Chest Tube:250] Intake/Output this shift: No intake/output data recorded.  PE: General: cachectic male who is laying in bed intubated and sedated Heart: regular, rate, and rhythm. Palpable radial and pedal pulses bilaterally Lungs: on the vent, L chest tube in place with SS fluid Abd: soft, mildly distended, nontender, NGT with bilious drainage, drain present with thick purulent appearing fluid   Lab Results:  Recent Labs    10/24/20 0406 10/25/20 0213  WBC 13.8* 12.7*  HGB 9.1* 8.4*  HCT 28.2* 26.3*  PLT 152 154   BMET Recent Labs    10/24/20 0750 10/25/20 0213  NA 142 139  K 3.6 4.1  CL 110 105  CO2 19* 21*  GLUCOSE 224* 563*  BUN 73* 72*  CREATININE 2.88* 2.56*  CALCIUM 7.1* 7.6*   PT/INR No results for input(s): LABPROT, INR in the last 72 hours. CMP     Component Value Date/Time   NA 139 10/25/2020 0213   NA 140 03/09/2018 1620   NA 136 01/06/2015 1448   K 4.1 10/25/2020 0213   K 3.6 01/06/2015 1448   CL 105 10/25/2020 0213   CL 99 01/06/2015 1448   CO2 21 (L) 10/25/2020 0213   CO2 28 01/06/2015 1448   GLUCOSE 563 (HH) 10/25/2020 0213   GLUCOSE 154 (H) 01/06/2015 1448   BUN 72 (H) 10/25/2020 0213   BUN 12 03/09/2018 1620   BUN 12 01/06/2015 1448   CREATININE 2.56 (H)  10/25/2020 0213   CREATININE 0.88 01/06/2015 1448   CALCIUM 7.6 (L) 10/25/2020 0213   CALCIUM 8.7 01/06/2015 1448   PROT 4.6 (L) 10/25/2020 0213   PROT 7.4 03/09/2018 1620   PROT 8.0 01/06/2015 1448   ALBUMIN 1.1 (L) 10/25/2020 0213   ALBUMIN 4.8 03/09/2018 1620   ALBUMIN 4.2 01/06/2015 1448   AST 126 (H) 10/25/2020 0213   AST 32 01/06/2015 1448   ALT 201 (H) 10/25/2020 0213   ALT 44 01/06/2015 1448   ALKPHOS 74 10/25/2020 0213   ALKPHOS 123 (H) 01/06/2015 1448   BILITOT 1.7 (H) 10/25/2020 0213   BILITOT 0.6 03/09/2018 1620   BILITOT 0.3 01/06/2015 1448   GFRNONAA 27 (L) 10/25/2020 0213   GFRNONAA >60 01/06/2015 1448   GFRAA 113 03/09/2018 1620   GFRAA >60 01/06/2015 1448   Lipase  No results found for: LIPASE     Studies/Results: DG CHEST PORT 1 VIEW  Result Date: 10/24/2020 CLINICAL DATA:  Left hemothorax EXAM: PORTABLE CHEST 1 VIEW COMPARISON:  10/23/2020 FINDINGS: Endotracheal tube, enteric tube, left chest tube, and right PICC line are unchanged in position. Shallow inspiration. Mild cardiac enlargement. Bilateral perihilar infiltrates are similar. Small residual left pleural effusion with basilar atelectasis. No significant change since previous study. IMPRESSION: Appliances remain in satisfactory position. Persistent bilateral perihilar infiltrates and left pleural effusion  with basilar atelectasis. Electronically Signed   By: Burman Nieves M.D.   On: 10/24/2020 04:28   [place] EKG Site Rite  Result Date: 10/23/2020 If Site Rite image not attached, placement could not be confirmed due to current cardiac rhythm.   Anti-infectives: Anti-infectives (From admission, onward)   Start     Dose/Rate Route Frequency Ordered Stop   10/08/2020 1400  fluconazole (DIFLUCAN) IVPB 200 mg        200 mg 100 mL/hr over 60 Minutes Intravenous Every 24 hours 10/14/2020 1148     10/19/20 1400  piperacillin-tazobactam (ZOSYN) IVPB 3.375 g        3.375 g 12.5 mL/hr over 240 Minutes  Intravenous Every 8 hours 10/19/20 1150     10/19/20 0200  vancomycin (VANCOREADY) IVPB 500 mg/100 mL  Status:  Discontinued        500 mg 100 mL/hr over 60 Minutes Intravenous Every 12 hours 10/19/20 0025 10/19/20 0951   10/19/20 0200  metroNIDAZOLE (FLAGYL) IVPB 500 mg  Status:  Discontinued        500 mg 100 mL/hr over 60 Minutes Intravenous Every 8 hours 10/19/20 0045 10/19/20 1150   10/19/20 0115  ceFEPIme (MAXIPIME) 2 g in sodium chloride 0.9 % 100 mL IVPB  Status:  Discontinued        2 g 200 mL/hr over 30 Minutes Intravenous Every 12 hours 10/19/20 0025 10/19/20 1150   10/17/20 1400  Ampicillin-Sulbactam (UNASYN) 3 g in sodium chloride 0.9 % 100 mL IVPB  Status:  Discontinued        3 g 200 mL/hr over 30 Minutes Intravenous Every 6 hours 10/17/20 1207 10/18/20 2325   10/17/20 1400  fluconazole (DIFLUCAN) IVPB 400 mg  Status:  Discontinued        400 mg 100 mL/hr over 120 Minutes Intravenous Every 24 hours 10/17/20 1207 10/17/2020 1148   10/15/20 1630  vancomycin (VANCOREADY) IVPB 500 mg/100 mL  Status:  Discontinued        500 mg 100 mL/hr over 60 Minutes Intravenous Every 12 hours 10/15/20 0940 10/17/20 1205   10/14/20 0400  vancomycin (VANCOREADY) IVPB 500 mg/100 mL  Status:  Discontinued        500 mg 100 mL/hr over 60 Minutes Intravenous Every 24 hours 10/13/20 0233 10/15/20 0940   10/13/20 0330  vancomycin (VANCOREADY) IVPB 750 mg/150 mL        750 mg 150 mL/hr over 60 Minutes Intravenous  Once 10/13/20 0233 10/13/20 0439   10/12/20 1800  ceFEPIme (MAXIPIME) 2 g in sodium chloride 0.9 % 100 mL IVPB  Status:  Discontinued        2 g 200 mL/hr over 30 Minutes Intravenous Every 24 hours 10/11/20 1718 10/11/20 1742   10/12/20 1800  vancomycin (VANCOREADY) IVPB 750 mg/150 mL  Status:  Discontinued        750 mg 150 mL/hr over 60 Minutes Intravenous Every 24 hours 10/11/20 1724 10/11/20 1758   10/12/20 0200  metroNIDAZOLE (FLAGYL) tablet 500 mg  Status:  Discontinued        500  mg Oral Every 8 hours 10/11/20 1715 10/11/20 1758   10/12/20 0030  piperacillin-tazobactam (ZOSYN) IVPB 3.375 g  Status:  Discontinued        3.375 g 12.5 mL/hr over 240 Minutes Intravenous Every 8 hours 10/11/20 1805 10/17/20 1205   10/11/20 1830  piperacillin-tazobactam (ZOSYN) IVPB 3.375 g        3.375 g 100 mL/hr over 30 Minutes  Intravenous  Once 10/11/20 1805 10/11/20 1839   10/11/20 1715  ceFEPIme (MAXIPIME) 2 g in sodium chloride 0.9 % 100 mL IVPB  Status:  Discontinued        2 g 200 mL/hr over 30 Minutes Intravenous STAT 10/11/20 1710 10/11/20 1742   10/11/20 1715  metroNIDAZOLE (FLAGYL) IVPB 500 mg  Status:  Discontinued        500 mg 100 mL/hr over 60 Minutes Intravenous STAT 10/11/20 1710 10/11/20 1807   10/11/20 1715  vancomycin (VANCOCIN) IVPB 1000 mg/200 mL premix  Status:  Discontinued        1,000 mg 200 mL/hr over 60 Minutes Intravenous STAT 10/11/20 1710 10/11/20 1804   10/11/2020 1541  ceFAZolin (ANCEF) 2-4 GM/100ML-% IVPB       Note to Pharmacy: [facility]   : cabinet override      10/07/2020 1541 10/06/20 0344       Assessment/Plan RLL PNA Metabolic encephalopathy  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 CT with PO contrast at some point next week to assess for origin and healing. At that time if patient appears to have leak will need to consider continuing TPN and NPO vs palliative  consult vs surgical intervention understanding that patient is a high risk surgical candidate given recent stroke  New L hemothorax - S/p VATS 11/22 Dr. Cliffton Asters, per TCTS  FEN: NPO, IVF, PICC and TPN  VTE: SCDs, ASA suppository ID: current abx are zosyn and flagyl  LOS: 20 days    Berna Bue ,MD [facility] 10/25/2020, 8:11 AM Please see Amion to contact general surgery on-call if needed.

## 2020-10-25 NOTE — Progress Notes (Signed)
E link notified of Glucose 563 MD ordered repeat BMP

## 2020-10-25 NOTE — Progress Notes (Signed)
      [address]       [place] [address]             [phone]       3 Days Post-Op Procedure(s) (LRB): VIDEO ASSISTED THORACOSCOPY (VATS)/DECORTICATION (Left)   Subjective:  Remains on vent, weaning as tolerated  Objective: Vital signs in last 24 hours: Temp:  [97.9 F (36.6 C)-100.1 F (37.8 C)] 98.9 F (37.2 C) (11/25 0815) Pulse Rate:  [57-97] 92 (11/25 0845) Cardiac Rhythm: Normal sinus rhythm (11/25 0337) Resp:  [13-25] 25 (11/25 0800) BP: (105-182)/(55-83) 152/71 (11/25 0845) SpO2:  [90 %-100 %] 100 % (11/25 0845) Arterial Line BP: (100-183)/(46-69) 149/54 (11/24 1900) FiO2 (%):  [30 %] 30 % (11/25 0732) Weight:  [59.3 kg] 59.3 kg (11/25 0340)  Intake/Output from previous day: 11/24 0701 - 11/25 0700 In: 2881.9 [I.V.:2556.8; IV Piggyback:325.1] Out: 2350 [Urine:1765; Emesis/NG output:250; Drains:85; Chest Tube:250]  General appearance: sedated on vent Heart: regular rate and rhythm Lungs: on vent, CXR stable Wound: clean and dry  Lab Results: Recent Labs    10/24/20 0406 10/25/20 0213  WBC 13.8* 12.7*  HGB 9.1* 8.4*  HCT 28.2* 26.3*  PLT 152 154   BMET:  Recent Labs    10/25/20 0213 10/25/20 0741  NA 139 142  K 4.1 3.5  CL 105 109  CO2 21* 21*  GLUCOSE 563* 130*  BUN 72* 77*  CREATININE 2.56* 2.67*  CALCIUM 7.6* 7.5*    PT/INR: No results for input(s): LABPROT, INR in the last 72 hours. ABG    Component Value Date/Time   PHART 7.302 (L) 10/20/2020 0359   HCO3 21.1 10/20/2020 0359   TCO2 22 10/20/2020 0359   ACIDBASEDEF 5.0 (H) 10/20/2020 0359   O2SAT 94.0 10/20/2020 0359   CBG (last 3)  Recent Labs    10/24/20 2348 10/25/20 0356 10/25/20 0819  GLUCAP 137* 119* 117*    Assessment/Plan: S/P Procedure(s) (LRB): VIDEO ASSISTED THORACOSCOPY (VATS)/DECORTICATION (Left)  1. Chest tube- output decreasing, down to 250 cc output yesterday, leave chest tube in place until extubated, continue current care     LOS: 20 days    Lowella Dandy, PA-C 10/25/2020

## 2020-10-25 NOTE — Progress Notes (Signed)
NAME:  Mark May, MRN:  [id number], DOB:  26-Jun-1956, LOS: 20 ADMISSION DATE:  10/04/2020, CONSULTATION DATE: 10/21/2020 REFERRING MD: Dr. Curtis Sites, CHIEF COMPLAINT: Left-sided weakness  Brief History   64 year old male with HTN and HLD who presented with left-sided weakness, noted to have right MCA occlusion status post thrombectomy and stent placement 11/5.  Hospitalization complicated by hypoxic respiratory failure secondary to pulmonary edema requiring BiPAP and lasix.  Awaiting CIR placement however, on the evening of 11/10, he was noted to become more lethargic with tachycardia and tachypnea.  Had complained of abdominal pain with some distention and chest pain which resolved after burping.  KUB obtained which was normal.  On 11/11, he was somewhat more responsive but now with increasing sCr in which lisinopril was stopped and development of fever 102.6  TRH initially consulted for help with medical management however on their evaluation, PCCM consulted given ill appearance and developing hypotension and new onset Afib with RVR found to have sepsis with RLL pneumonia likely due to aspiration started on zosyn. PCCM consulted for continue right abdominal pain and distension with concern for bowel perforation secondary to Cortrak.  On 11/13, CT abd/ pelvis showed large right perinephric fluid collection and pockets of air concerning for an infectious process/abscess. Underwent CT guided drain placement of right retroperitoneal abscess.  On 11/15, he developed melena with anemia.  GI consulted with plans to medically management and deferred EGD.  Surgery was consulted on 11/17 with imaging concerning for possible contained bowel perforation.  On 11/20, he developed respiratory distress requiring intubation. Underwent thoracentesis on 11/20.  Hypotensive with Hgb drob on 11/21, CT ABD/Pelvis w/ concern for active RP bleed.  Subsequently, developed hemothorax requiring OR for VATS on 11/22.    Past  Medical History  Hypertension Peptic ulcer disease, previous partial gastrectomy (in [place]/ 1990) Gout BPH Sciatica   Significant Hospital Events   11/05 Mechanical thrombectomy of right MCA and stent 11/5 11/11 PCCM reconsulted  11/12 Cortrak placed 11/14 CT guided drainage for R RP abscess with JP 11/17 PCCM reconsulted 11/18 CCM recalled by rapid response, fever, resp distress, CXR new left infiltrates with left pleural effusion 11/20 Thoracentesis  11/21 Hypotensive with Hgb drop to 6.1 s/p 2 units PRBC, CT ABD with concerning for active bleeding 11/22 To OR for VATS, decortication   Consults:  IR Neurology PCCM 11/5- 11/8; 11/11; 11/16-11/17, 11/18 -  TRH Urology 11/13 GI 11/15 CCS 11/17  Procedures:  R Femoral Sheath 11/6 >> 11/6 RUE PICC 11/17 >>  ETT 11/20 >>   Significant Diagnostic Tests:  11/5 CT head >> No acute abnormality.  11/5 CTA head / neck >> Severe distal right M1 stenosis or subocclusive thrombus, Right MCA infarct with extensive penumbra, 4 mm right supraclinoid ICA aneurysm. Widely patent cervical carotid and vertebral arteries. 11/5 Postprocedure CT head >> Hypodensity in the right lateral temporal lobe now shows mild hemorrhage or contrast enhancement. This is most consistent with acute infarct. There has been interval stenting of the right middle cerebral artery. 11/6 MRI brain >> Fairly extensive patchy acute/early subacute infarcts within the right MCA/watershed territory.  Additional small acute/early subacute infarcts within the dorsal right thalamus and left basal ganglia. 11/6  MRA head >>  Interval stenting of the M1/M2 right middle cerebral artery. Flow related signal is present proximal and distal to the stent suggestive of stent patency. 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 11/6 TTE >> LVEF 70-75%, no RWMA,  Grade II diastolic dysfunction, elevated LA pressure 11/13 CT ABD/Pelvis >> large right  perinephric fluid collection and pockets of air concerning for an infectious process/abscess. Cholelithiasis. Nonobstructing right renal calculi. No hydronephrosis. Colonic diverticulosis. No bowel obstruction. Partially visualized small bilateral pleural effusions with complete consolidative changes of the visualized lower lobes. 11/16 CT ABD/Pelvis >> interval placement of drainage catheter into the right perinephric space in the area of previously seen gas and fluid collection. Fluid collection has decreased since prior study. There is now contrast material seen within the perinephric space and extending into the right paracolic gutter. This is concerning for possible fistulous communication to the colon. May consider contrast injection through the drainage catheter under fluoroscopic guidance to assess for enteric fistula. Moderate bilateral pleural effusions with bibasilar atelectasis or consolidation, unchanged.  Cholelithiasis.  NG tube in the stomach.  Aortic atherosclerosis.  Small to moderate free fluid in the pelvis. 11/20 L Thoracentesis 650 ml cloudy yellow fluid 11/21 CT abd/ pelvis >> very large area of intra-abdominal fluid and blood within the left upper quadrant, with an appearance worrisome for active bleeding.  Stable position of the right-sided percutaneous drainage catheter with a moderate amount of surrounding contained free air, fluid and inflammatory fat stranding. Fistulous communication with the adjacent portion of large bowel cannot be excluded. Moderate to marked amount of para muscular subcutaneous inflammatory fat stranding along the lateral aspects of the left abdominal and pelvic walls.   Micro Data:  MRSA PCR 11/5 >> negative COVID 11/5 >> negative Flu 11/5 >> negative UC 11/10 >> neg BCx2 11/11 >> neg BC x2 11/13 >> neg Right RRP abscess 11/14 >> moderate candida, klebsiella >>  BCx 2 11/19 >> Left pleural fluid 11/20 >> Klebsiella >>   Antimicrobials:  Cefazolin  11/5 >> 11/6 Flagyl 11/11 x1 Cefepime 11/11, 11/18 >> 11/19  Vanco 11/12 >> 11/16, 11/18  Unasyn 11/17 >> 11/18  Flagyl 11/18 >> 11/19  Diflucan 11/17 >>  Zosyn 11/11 >> 11/17, 11/19 >>   Interim history/subjective:   Patient remains critically ill intubated on mechanical life support.  Sedated with Precedex.  Objective   Blood pressure 140/63, pulse 80, temperature 99.7 F (37.6 C), temperature source Oral, resp. rate 17, weight 59.3 kg, SpO2 100 %.    Vent Mode: PRVC FiO2 (%):  [30 %] 30 % Set Rate:  [15 bmp] 15 bmp Vt Set:  [520 mL] 520 mL PEEP:  [5 cmH20] 5 cmH20 Plateau Pressure:  [17 cmH20-21 cmH20] 17 cmH20   Intake/Output Summary (Last 24 hours) at 10/25/2020 0737 Last data filed at 10/25/2020 0700 Gross per 24 hour  Intake 2881.86 ml  Output 2350 ml  Net 531.86 ml   Filed Weights   10/21/20 0424 10/06/2020 0100 10/25/20 0340  Weight: 56.1 kg 59.3 kg 59.3 kg   Physical Exam General: Thin cachectic gentleman on mechanical life support HEENT: Opens eyes to voice follows commands Neuro: Comfortable on Precedex moves right arm and lower extremity, hemiparetic on the left CV: Regular rate rhythm, S1-S2 PULM: Bilateral mechanically ventilated breath sounds GI: Soft, nontender nondistended Extremities: Bilateral lower extremity edema Skin: No rash  Resolved Hospital Problems:  Hypernatremia  Assessment & Plan:   Complex clinical course: admitted with R MCA CVA.   Acute respiratory failure secondary to aspiration pneumonia with bilateral pleural effusion, left klebsiella empyema  Hemothorax following thoracentesis  S/p thora on 11/20 with exudative properties, klebsiella in pleural culture & RP abscess.  Concern for post thora extravasation on CTA  chest  Plan: Continue full mechanical vent support Continue to wean FiO2 and PEEP as tolerated Daily SBT SAT Off fentanyl infusion Continue Precedex PAD protocol sedation Goal RASS 0 to -1 Continue diuresis.   Hopefully once he is a little bit more net negative we can consider extubation from ventilator.  Mixed Shock - Septic + ABLA / Hypovolemic Multifactorial, ABLA and septic. S/p 2 units blood on 11/21.  Klebsiella Empyema + fluid collection in abd.  Sepsis secondary to RLL PNA, Intra-abdominal Abscess Suspected aspiration and intraabdominal abscess (klebsiella and candida) in setting of bowel perforation and now with left empyema . Pan: Continue to observe Continue antifungals, fluconazole Continue antimicrobials.  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 Superior-most edge lines up with distal end of cortrak. Postop care per CCS, TPN per CCS  AKI  Plan: Continue to follow urine output diuresis.  PAF with RVR -tele monitoring  Restart heparin, continue to observe for any blood loss  Acute R MCA CVA s/p thrombectomy and right MCA stent placement by IR with TICI 3 Left hemiplegia Dysphagia Plan: Continue aspirin  Severe Protein Calorie Malnutrition TPN plus bowel rest   Best practice:  Diet: bowel rest/ NPO. Continue TPN Pain/Anxiety/Delirium protocol (if indicated):  precedex VAP protocol (if indicated): Oral care protocol DVT prophylaxis: SCDs for now GI prophylaxis: PPI  Glucose control: SSI Mobility: bed rest Code Status: full code Family Communication: updated family this morning at bedside  Disposition:  ICU  LABS    PULMONARY Recent Labs  Lab 10/18/20 2208 10/20/20 0359  PHART 7.532* 7.302*  PCO2ART 22.4* 42.6  PO2ART 56.7* 76*  HCO3 19.0* 21.1  TCO2  --  22  O2SAT 92.3 94.0    CBC Recent Labs  Lab 10/23/20 0536 10/24/20 0406 10/25/20 0213  HGB 10.6* 9.1* 8.4*  HCT 33.2* 28.2* 26.3*  WBC 16.9* 13.8* 12.7*  PLT 182 152 154    COAGULATION Recent Labs  Lab 10/21/20 0139 10/11/2020 0446  INR 1.7* 1.3*    CARDIAC  No results for input(s): TROPONINI in  the last 168 hours. No results for input(s): PROBNP in the last 168 hours.   CHEMISTRY Recent Labs  Lab 10/19/20 0032 10/20/20 0359 10/20/20 4128 10/20/20 7867 10/21/20 1737 10/21/20 1737 10/07/2020 0446 10/28/2020 0446 10/23/20 0536 10/23/20 0536 10/24/20 0750 10/25/20 0213  NA 148*   < > 146*   < > 144  --  144  --  139  --  142 139  K 3.6   < > 4.0   < > 5.6*   < > 4.7   < > 5.3*   < > 3.6 4.1  CL 121*  --  119*   < > 118*  --  120*  --  115*  --  110 105  CO2 20*  --  19*   < > 17*  --  16*  --  16*  --  19* 21*  GLUCOSE 130*  --  341*   < > 209*  --  116*  --  225*  --  224* 563*  BUN 26*  --  35*   < > 66*  --  61*  --  64*  --  73* 72*  CREATININE 1.03  --  1.20   < > 2.62*  --  2.74*  --  2.96*  --  2.88* 2.56*  CALCIUM 7.4*  --  7.5*   < >  7.3*  --  6.8*  --  7.0*  --  7.1* 7.6*  MG 2.2  --  2.1  --   --   --  2.0  --   --   --   --  2.3  PHOS 3.0  --  3.7  --   --   --  4.9*  --  6.3*  --   --  2.2*   < > = values in this interval not displayed.   Estimated Creatinine Clearance: 24.1 mL/min (A) (by C-G formula based on SCr of 2.56 mg/dL (H)).   LIVER Recent Labs  Lab 10/21/20 0139 10/21/20 0627 10/21/20 1737 10/09/2020 0446 10/25/20 0213  AST  --   --  144* 170* 126*  ALT  --   --  80* 99* 201*  ALKPHOS  --   --  70 67 74  BILITOT  --   --  1.6* 1.7* 1.7*  PROT  --  3.7* 5.0* 4.4* 4.6*  ALBUMIN  --   --  1.5* 1.3* 1.1*  INR 1.7*  --   --  1.3*  --     INFECTIOUS Recent Labs  Lab 10/18/20 2258 10/19/20 0032 10/20/20 0632  LATICACIDVEN 1.6 1.5  --   PROCALCITON  --  1.38 1.96    ENDOCRINE CBG (last 3)  Recent Labs    10/24/20 1949 10/24/20 2348 10/25/20 0356  GLUCAP 174* 137* 119*    This patient is critically ill with multiple organ system failure; which, requires frequent high complexity decision making, assessment, support, evaluation, and titration of therapies. This was completed through the application of advanced monitoring technologies  and extensive interpretation of multiple databases. During this encounter critical care time was devoted to patient care services described in this note for 32 minutes.  Josephine Igo, DO [facility] 10/25/2020 10:24 AM

## 2020-10-25 NOTE — Progress Notes (Signed)
ANTICOAGULATION CONSULT NOTE   Pharmacy Consult for Heparin Indication: Afib, s/p CVA  No Known Allergies  Patient Measurements: Weight: 59.3 kg (130 lb 11.7 oz) Heparin Dosing Weight:  56.7 kg  Vital Signs: Temp: 99.9 F (37.7 C) (11/25 1219) Temp Source: Oral (11/25 1219) BP: 127/65 (11/25 1500) Pulse Rate: 83 (11/25 1500)  Labs: Recent Labs    10/23/20 0536 10/23/20 0536 10/24/20 0406 10/24/20 0750 10/24/20 1700 10/25/20 0213 10/25/20 0741 10/25/20 1356  HGB 10.6*   < > 9.1*  --   --  8.4*  --   --   HCT 33.2*  --  28.2*  --   --  26.3*  --   --   PLT 182  --  152  --   --  154  --   --   HEPARINUNFRC  --   --   --   --  <0.10* <0.10*  --  <0.10*  CREATININE 2.96*   < >  --  2.88*  --  2.56* 2.67*  --    < > = values in this interval not displayed.    Estimated Creatinine Clearance: 23.1 mL/min (A) (by C-G formula based on SCr of 2.67 mg/dL (H)).  Assessment: 28 YOM who presented on 11/5 with R MCA stroke s/p IR + stent placement for reocclusion. Noted to have new onset Afib and pharmacy was consulted to dose heparin. Patient with concern for GIB and L hemothorax now s/p VATS procedure originally holding heparin now pharmacy has been consulted to resume. Aiming for lower goal of 0.3-0.5 d/t R MCA stroke and concern for GIB. CBC improved.  Heparin level is undetectable x3 levels now on current rate of 900 units/hr. Hgb down to 8.4. Platelets stable. No overt bleeding noted. Level drawn correctly from opposite arm. Heparin infusing well with no signs of extravasation.   Goal of Therapy:  Heparin level 0.3 - 0.5 units/ml Monitor platelets by anticoagulation protocol: Yes    Plan:  Increase Heparin at 1050 units/hr (10.5 ml/hr) 6h heparin level Daily heparin level, CBC, monitor s/s bleeding  Thank you for allowing pharmacy to be a part of this patient's care.  Link Snuffer, PharmD, BCPS, BCCCP Clinical Pharmacist Please refer to [facility] for [facility]  numbers 10/25/2020 3:41 PM  Please check [email or web address] for unit specific pharmacy phone numbers.

## 2020-10-25 NOTE — Progress Notes (Signed)
ANTICOAGULATION CONSULT NOTE   Pharmacy Consult for Heparin Indication: Afib, s/p CVA  Assessment: 58 YOM who presented on 11/5 with R MCA stroke s/p IR + stent placement for reocclusion. Noted to have new onset Afib and pharmacy was consulted to dose heparin. Patient with concern for GIB and L hemothorax now s/p VATS procedure originally holding heparin now pharmacy has been consulted to resume. Aiming for lower goal of 0.3-0.5 d/t R MCA stroke and concern for GIB. CBC improved.  Heparin level remains undetectable.  No issues noted with infusion.  No bleeding reported.  Goal of Therapy:  Heparin level 0.3 - 0.5 units/ml Monitor platelets by anticoagulation protocol: Yes    Plan:  Increase Heparin to 1200 6h heparin level Daily heparin level, CBC, monitor s/s bleeding  Thanks for allowing pharmacy to be a part of this patient's care.  Talbert Cage, PharmD Clinical Pharmacist

## 2020-10-25 NOTE — Progress Notes (Signed)
[facility] Progress Note  Name: Mark May MRN: [id number] DOB: [birth year]  Chief Complaint:  Initially presented w/left sided weakness   Subjective:  He had 1.8 liters UOP over 11/24. Marked hyperglycemia overnight but was drawn from line with TPN so is being repeated.  Spoke with his daughter at bedside.  Spoke with his team and they may extubate today  Review of systems:  Unable to obtain 2/2 intubated   --------------- Background on consult:  Mark May is a 64 y.o. male with a history of HTN and HLD and a complicated recent course.  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  Had a thrombectomy and stent placement on 11/5.  He has had hypoxic resp failure requiring BIPAP and ultimately later in his recent course, intubation on 11/20 for resp distress.  His hospitalization has unfortunately been more recently complicated by bowel perforation 2/2 cortrack, retroperitoneal abscess, and a thoracentesis (11/20) which was complicated by intercostal artery bleed per pulm report.  He is s/p VATS for hemothorax on 11/22.  He has been on norepi which critical care feels has been related to sedation.  Note also hypotension with hemoglobin drop on 11/21 with CT a/p concerning for RP bleed.  Note earlier in his course, he also had a CT-guided drain placement for right retroperitoneal abscess.  Earlier in his course had melena as well.  Most recently has developed AKI.  Nephrology is consulted for assistance with management of AKI.  Lab trends as below with baseline creatinine 1 or less and creatinine ultimately 2.96 on 11/23 the date of consult.  He had 1.4 liters UOP over 11/22 charted (up from 730 mL charted the day before) but did have a rise in Cr.  He was ordered metolazone on 11/23 and has three doses of lasix 40 mg IV ordered for 11/23 (every 6 hours).  Patient has been on phenylephrine 160 mcg/min.  I spoke with his son who has been at  bedside and he is keeping his other family closely informed of his dad's status including his mother/pt's wife.  They are hopeful that maybe once the patient is safely off of bowel rest that things will start to turn around.  There's just been a lot going on.  Intake/Output Summary (Last 24 hours) at 10/25/2020 0751 Last data filed at 10/25/2020 0700 Gross per 24 hour  Intake 2881.86 ml  Output 2350 ml  Net 531.86 ml    Vitals:  Vitals:   10/25/20 0715 10/25/20 0730 10/25/20 0731 10/25/20 0732  BP: (!) 143/64 140/63 140/63   Pulse: 79 79 80   Resp: 16 15 17    Temp:      TempSrc:      SpO2: 100% 100% 100% 100%  Weight:         Physical Exam:   General: Adult male in bed intubated HEENT: Normocephalic atraumatic Eyes: sclera anicteric tracks visually Neck: Trachea midline normal neck circumference Heart: S1-S2 no rub appreciated Lungs: coarse mechanical breath sounds Abdomen: Softly distended no bowel sounds Extremities: 1-2+ edema of upper and lower extremities Neuro: interactive on continuous sedation and follows commands GU - foley in place with uop  Medications reviewed    Labs:  BMP Latest Ref Rng & Units 10/25/2020 10/24/2020 10/23/2020  Glucose 70 - 99 mg/dL 10/25/2020) 267(TI) 458(K)  BUN 8 - 23 mg/dL 998(P) 38(S) 50(N)  Creatinine 0.61 - 1.24 mg/dL 39(J) 6.73(A) 1.93(X)  BUN/Creat Ratio 10 -  24 - - -  Sodium 135 - 145 mmol/L 139 142 139  Potassium 3.5 - 5.1 mmol/L 4.1 3.6 5.3(H)  Chloride 98 - 111 mmol/L 105 110 115(H)  CO2 22 - 32 mmol/L 21(L) 19(L) 16(L)  Calcium 8.9 - 10.3 mg/dL 7.6(L) 7.1(L) 7.0(L)     Assessment/Plan:   # Acute kidney injury - Multifactorial ATN in the setting of hypotension and ischemic injury from anemia as well as contrast on 11/21 (CTA C/a/p).  Note no hydro on CT and multiple nonobstructing right renal calculi.   RBC noted on UA; neg protein on UA with up/cr ratio of 2050 mg/g  - Improving with supportive care   - Lasix 60 mg IV  once now - robust response to lasix 40 mg once earlier - pharmacy is dosing zosyn - Repeat UA - RBC's but note stones  # Hyperkalemia - resolved; TPN per pharmacy - K has been added back in   # Metabolic acidosis - Setting of shock, AKI - Improved - on bowel rest - can't take bicarb PO and we have intermittently given IV bicarb amps  # Acute hypoxic respiratory failure - on mechanical ventilation per pulm  # Shock - multifactorial - resolved - note abscess, acute blood loss - now off pressors - antibiotics per primary team   # Anemia secondary to acute blood loss - Continue supportive care with transfusions as needed   # Bowel perforation 2/2 cortrack - note on TPN currently   # Retroperitoneal abscess - abx per primary  - s/p drain placement    # Acute right MCA CVA s/p thrombectomy and right MCA stent placement - per primary team  - on ASA per primary   # A fib with RVR - noted; rate control per primary team   # proteinuria  - Note pre-DM  - renal failure improving; trend protein losses  Estanislado Emms, MD 10/25/2020  8:07 AM

## 2020-10-26 DIAGNOSIS — I4891 Unspecified atrial fibrillation: Secondary | ICD-10-CM | POA: Diagnosis not present

## 2020-10-26 DIAGNOSIS — N179 Acute kidney failure, unspecified: Secondary | ICD-10-CM | POA: Diagnosis not present

## 2020-10-26 DIAGNOSIS — I6601 Occlusion and stenosis of right middle cerebral artery: Secondary | ICD-10-CM | POA: Diagnosis not present

## 2020-10-26 LAB — COMPREHENSIVE METABOLIC PANEL
ALT: 128 U/L — ABNORMAL HIGH (ref 0–44)
AST: 67 U/L — ABNORMAL HIGH (ref 15–41)
Albumin: 1.1 g/dL — ABNORMAL LOW (ref 3.5–5.0)
Alkaline Phosphatase: 90 U/L (ref 38–126)
Anion gap: 14 (ref 5–15)
BUN: 80 mg/dL — ABNORMAL HIGH (ref 8–23)
CO2: 24 mmol/L (ref 22–32)
Calcium: 7.8 mg/dL — ABNORMAL LOW (ref 8.9–10.3)
Chloride: 106 mmol/L (ref 98–111)
Creatinine, Ser: 2.49 mg/dL — ABNORMAL HIGH (ref 0.61–1.24)
GFR, Estimated: 28 mL/min — ABNORMAL LOW (ref >60)
Glucose, Bld: 165 mg/dL — ABNORMAL HIGH (ref 70–99)
Potassium: 2.8 mmol/L — ABNORMAL LOW (ref 3.5–5.1)
Sodium: 144 mmol/L (ref 135–145)
Total Bilirubin: 2.1 mg/dL — ABNORMAL HIGH (ref 0.3–1.2)
Total Protein: 5.3 g/dL — ABNORMAL LOW (ref 6.5–8.1)

## 2020-10-26 LAB — CBC
HCT: 26.8 % — ABNORMAL LOW (ref 39.0–52.0)
Hemoglobin: 8.8 g/dL — ABNORMAL LOW (ref 13.0–17.0)
MCH: 28 pg (ref 26.0–34.0)
MCHC: 32.8 g/dL (ref 30.0–36.0)
MCV: 85.4 fL (ref 80.0–100.0)
Platelets: 204 10*3/uL (ref 150–400)
RBC: 3.14 MIL/uL — ABNORMAL LOW (ref 4.22–5.81)
RDW: 17.9 % — ABNORMAL HIGH (ref 11.5–15.5)
WBC: 12.5 10*3/uL — ABNORMAL HIGH (ref 4.0–10.5)
nRBC: 0 % (ref 0.0–0.2)

## 2020-10-26 LAB — HEPARIN LEVEL (UNFRACTIONATED)
Heparin Unfractionated: <0.1 IU/mL — ABNORMAL LOW (ref 0.30–0.70)
Heparin Unfractionated: <0.1 IU/mL — ABNORMAL LOW (ref 0.30–0.70)
Heparin Unfractionated: <0.1 IU/mL — ABNORMAL LOW (ref 0.30–0.70)

## 2020-10-26 LAB — GLUCOSE, CAPILLARY
Glucose-Capillary: 135 mg/dL — ABNORMAL HIGH (ref 70–99)
Glucose-Capillary: 142 mg/dL — ABNORMAL HIGH (ref 70–99)
Glucose-Capillary: 150 mg/dL — ABNORMAL HIGH (ref 70–99)
Glucose-Capillary: 151 mg/dL — ABNORMAL HIGH (ref 70–99)
Glucose-Capillary: 159 mg/dL — ABNORMAL HIGH (ref 70–99)
Glucose-Capillary: 171 mg/dL — ABNORMAL HIGH (ref 70–99)

## 2020-10-26 LAB — PHOSPHORUS: Phosphorus: 2.9 mg/dL (ref 2.5–4.6)

## 2020-10-26 LAB — ANTITHROMBIN III: AntiThromb III Func: 83 % (ref 75–120)

## 2020-10-26 LAB — MAGNESIUM: Magnesium: 2 mg/dL (ref 1.7–2.4)

## 2020-10-26 MED ORDER — POTASSIUM CHLORIDE 20 MEQ PO PACK
40.0000 meq | PACK | Freq: Once | ORAL | Status: DC
  Filled 2020-10-26: qty 2

## 2020-10-26 MED ORDER — POTASSIUM CHLORIDE 10 MEQ/50ML IV SOLN
10.0000 meq | INTRAVENOUS | Status: AC
  Administered 2020-10-26 (×3): 10 meq via INTRAVENOUS
  Filled 2020-10-26 (×3): qty 50

## 2020-10-26 MED ORDER — TRAVASOL 10 % IV SOLN
INTRAVENOUS | Status: AC
  Filled 2020-10-26: qty 1220.4

## 2020-10-26 NOTE — Progress Notes (Signed)
ANTICOAGULATION CONSULT NOTE  Pharmacy Consult for Heparin Indication: Afib, s/p CVA  No Known Allergies  Patient Measurements: Weight: 56.1 kg (123 lb 10.9 oz) Heparin Dosing Weight:  56.7 kg  Vital Signs: Temp: 97.6 F (36.4 C) (11/26 1535) Temp Source: Oral (11/26 1535) BP: 159/78 (11/26 2000) Pulse Rate: 76 (11/26 2000)  Labs: Recent Labs    10/24/20 0406 10/24/20 0750 10/25/20 0213 10/25/20 0741 10/25/20 1356 10/25/20 2236 10/26/20 0500 10/26/20 0529 10/26/20 1137 10/26/20 2000  HGB 9.1*   < > 8.4*  --   --   --  8.8*  --   --   --   HCT 28.2*  --  26.3*  --   --   --  26.8*  --   --   --   PLT 152  --  154  --   --   --  204  --   --   --   HEPARINUNFRC  --    < > <0.10*  --    < >   < >  --  <0.10* <0.10* <0.10*  CREATININE  --    < > 2.56* 2.67*  --   --  2.49*  --   --   --    < > = values in this interval not displayed.    Estimated Creatinine Clearance: 23.5 mL/min (A) (by C-G formula based on SCr of 2.49 mg/dL (H)).  Assessment: 35 YOM who presented on 11/5 with R MCA stroke s/p IR + stent placement for reocclusion. Noted to have new onset Afib and pharmacy was consulted to dose heparin. Patient with concern for GIB and L hemothorax now s/p VATS procedure originally holding heparin now pharmacy has been consulted to resume. Aiming for lower goal of 0.3-0.5 d/t R MCA stroke and concern for GIB. CBC improved.  Heparin level has been undetectable since it was restarted on 10/24/20.  He was on heparin 2 weeks ago and was therapeutic on 800 units/hr.  Currently on 1600 units/hr - which is a high unit/kg dose and heparin level remains undetectable.  AT3 WNL.  Discussed with Elink MD the possibility of switching to a DTI, MD would like to defer to the primary ICU team.  Discussed with RN the possibility of moving the IV site further down the left hand to explore other options although there isn't any issue with the IV site - RN will look into it.  No bleeding  reported.  Goal of Therapy:  Heparin level 0.3 - 0.5 units/ml Monitor platelets by anticoagulation protocol: Yes    Plan:  Increase heparin gtt to 1800 units/hr Check 8 hr heparin level Monitor closely for s/sx of bleeding  Riva Sesma D. Laney Potash, PharmD, BCPS, BCCCP 10/26/2020, 10:13 PM

## 2020-10-26 NOTE — Progress Notes (Signed)
NAME:  Mark May, MRN:  [id number], DOB:  Nov 27, 1956, LOS: 21 ADMISSION DATE:  10/15/2020, CONSULTATION DATE: 10/15/2020 REFERRING MD: Dr. Curtis Sites, CHIEF COMPLAINT: Left-sided weakness  Brief History   64 year old male with HTN and HLD who presented with left-sided weakness, noted to have right MCA occlusion status post thrombectomy and stent placement 11/5.  Hospitalization complicated by hypoxic respiratory failure secondary to pulmonary edema requiring BiPAP and lasix.  Awaiting CIR placement however, on the evening of 11/10, he was noted to become more lethargic with tachycardia and tachypnea.  Had complained of abdominal pain with some distention and chest pain which resolved after burping.  KUB obtained which was normal.  On 11/11, he was somewhat more responsive but now with increasing sCr in which lisinopril was stopped and development of fever 102.6  TR[facility] initially consulted for help with medical management however on their evaluation, PCCM consulted given ill appearance and developing hypotension and new onset Afib with RVR found to have sepsis with RLL pneumonia likely due to aspiration started on zosyn. PCCM consulted for continue right abdominal pain and distension with concern for bowel perforation secondary to Cortrak.  On 11/13, CT abd/ pelvis showed large right perinephric fluid collection and pockets of air concerning for an infectious process/abscess. Underwent CT guided drain placement of right retroperitoneal abscess.  On 11/15, he developed melena with anemia.  GI consulted with plans to medically management and deferred EGD.  Surgery was consulted on 11/17 with imaging concerning for possible contained bowel perforation.  On 11/20, he developed respiratory distress requiring intubation. Underwent thoracentesis on 11/20.  Hypotensive with Hgb drob on 11/21, CT ABD/Pelvis w/ concern for active RP bleed.  Subsequently, developed hemothorax requiring OR for VATS on 11/22.    Past  Medical History  Hypertension Peptic ulcer disease, previous partial gastrectomy (in [place]/ 1990) Gout BPH Sciatica   Significant Hospital Events   11/05 Mechanical thrombectomy of right MCA and stent 11/5 11/11 PCCM reconsulted  11/12 Cortrak placed 11/14 CT guided drainage for R RP abscess with JP 11/17 PCCM reconsulted 11/18 CCM recalled by rapid response, fever, resp distress, CXR new left infiltrates with left pleural effusion 11/20 Thoracentesis  11/21 Hypotensive with Hgb drop to 6.1 s/p 2 units PRBC, CT ABD with concerning for active bleeding 11/22 To OR for VATS, decortication   Consults:  IR Neurology PCCM 11/5- 11/8; 11/11; 11/16-11/17, 11/18 -  [facility] Urology 11/13 GI 11/15 CCS 11/17  Procedures:  R Femoral Sheath 11/6 >> 11/6 RUE PICC 11/17 >>  ETT 11/20 >>   Significant Diagnostic Tests:  11/5 CT head >> No acute abnormality.  11/5 CTA head / neck >> Severe distal right M1 stenosis or subocclusive thrombus, Right MCA infarct with extensive penumbra, 4 mm right supraclinoid ICA aneurysm. Widely patent cervical carotid and vertebral arteries. 11/5 Postprocedure CT head >> Hypodensity in the right lateral temporal lobe now shows mild hemorrhage or contrast enhancement. This is most consistent with acute infarct. There has been interval stenting of the right middle cerebral artery. 11/6 MRI brain >> Fairly extensive patchy acute/early subacute infarcts within the right MCA/watershed territory.  Additional small acute/early subacute infarcts within the dorsal right thalamus and left basal ganglia. 11/6  MRA head >>  Interval stenting of the M1/M2 right middle cerebral artery. Flow related signal is present proximal and distal to the stent suggestive of stent patency. 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 11/6 TTE >> LVEF 70-75%, no RWMA,  Grade II diastolic dysfunction, elevated LA pressure 11/13 CT ABD/Pelvis >> large right  perinephric fluid collection and pockets of air concerning for an infectious process/abscess. Cholelithiasis. Nonobstructing right renal calculi. No hydronephrosis. Colonic diverticulosis. No bowel obstruction. Partially visualized small bilateral pleural effusions with complete consolidative changes of the visualized lower lobes. 11/16 CT ABD/Pelvis >> interval placement of drainage catheter into the right perinephric space in the area of previously seen gas and fluid collection. Fluid collection has decreased since prior study. There is now contrast material seen within the perinephric space and extending into the right paracolic gutter. This is concerning for possible fistulous communication to the colon. May consider contrast injection through the drainage catheter under fluoroscopic guidance to assess for enteric fistula. Moderate bilateral pleural effusions with bibasilar atelectasis or consolidation, unchanged.  Cholelithiasis.  NG tube in the stomach.  Aortic atherosclerosis.  Small to moderate free fluid in the pelvis. 11/20 L Thoracentesis 650 ml cloudy yellow fluid 11/21 CT abd/ pelvis >> very large area of intra-abdominal fluid and blood within the left upper quadrant, with an appearance worrisome for active bleeding.  Stable position of the right-sided percutaneous drainage catheter with a moderate amount of surrounding contained free air, fluid and inflammatory fat stranding. Fistulous communication with the adjacent portion of large bowel cannot be excluded. Moderate to marked amount of para muscular subcutaneous inflammatory fat stranding along the lateral aspects of the left abdominal and pelvic walls.   Micro Data:  MRSA PCR 11/5 >> negative COVID 11/5 >> negative Flu 11/5 >> negative UC 11/10 >> neg BCx2 11/11 >> neg BC x2 11/13 >> neg Right RRP abscess 11/14 >> moderate candida, klebsiella >>  BCx 2 11/19 >> Left pleural fluid 11/20 >> Klebsiella >>   Antimicrobials:  Cefazolin  11/5 >> 11/6 Flagyl 11/11 x1 Cefepime 11/11, 11/18 >> 11/19  Vanco 11/12 >> 11/16, 11/18  Unasyn 11/17 >> 11/18  Flagyl 11/18 >> 11/19  Diflucan 11/17 >>  Zosyn 11/11 >> 11/17, 11/19 >>   Interim history/subjective:   Remains critically ill. Intubated on life support. Good diuresis and UOP   Objective   Blood pressure (!) 151/83, pulse 83, temperature 99.6 F (37.6 C), temperature source Axillary, resp. rate (!) 31, weight 56.1 kg, SpO2 100 %.    Vent Mode: PSV;CPAP FiO2 (%):  [30 %] 30 % Set Rate:  [15 bmp] 15 bmp Vt Set:  [52 mL-520 mL] 52 mL PEEP:  [5 cmH20] 5 cmH20 Pressure Support:  [8 cmH20] 8 cmH20 Plateau Pressure:  [19 cmH20-22 cmH20] 21 cmH20   Intake/Output Summary (Last 24 hours) at 10/26/2020 0845 Last data filed at 10/26/2020 0700 Gross per 24 hour  Intake 3592.43 ml  Output 6890 ml  Net -3297.57 ml   Filed Weights   10/10/2020 0100 10/25/20 0340 10/26/20 0500  Weight: 59.3 kg 59.3 kg 56.1 kg    Physical Exam General: thin, cachetic male HEENT: ett in place Neuro: alert to voice  CV: RRR s1 s2  PULM: bilateral vented breaths  GI: soft, nt nd  Extremities: no edema  Skin: no rash   Resolved Hospital Problems:  Hypernatremia  Assessment & Plan:   Complex clinical course: admitted with R MCA CVA.   Acute respiratory failure secondary to aspiration pneumonia with bilateral pleural effusion, left klebsiella empyema  Hemothorax following thoracentesis  S/p thora on 11/20 with exudative properties, klebsiella in pleural culture & RP abscess.  Concern for post thora extravasation on CTA chest  Plan: Continue full mechanical,  vent support  Wean Fio2 and PEEP SBT/SAT this morning  Possible extubation today   Mixed Shock - Septic + ABLA / Hypovolemic Multifactorial, ABLA and septic. S/p 2 units blood on 11/21.  Klebsiella Empyema + fluid collection in abd.  Sepsis secondary to RLL PNA, Intra-abdominal Abscess Suspected aspiration and intraabdominal  abscess (klebsiella and candida) in setting of bowel perforation and now with left empyema . 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 Superior-most edge lines up with distal end of cortrak. - postop care per CCS  AKI  Plan: Continue to follow uop  Its improving   PAF with RVR -tele monitoring  - continue heparin per pharmacy   Acute R MCA CVA s/p thrombectomy and right MCA stent placement by IR with TICI 3 Left hemiplegia Dysphagia Plan: Continue ASA   Severe Protein Calorie Malnutrition Continue TPN    Best practice:  Diet: bowel rest/ NPO. Continue TPN Pain/Anxiety/Delirium protocol (if indicated):  precedex VAP protocol (if indicated): Oral care protocol DVT prophylaxis: SCDs for now GI prophylaxis: PPI  Glucose control: SSI Mobility: bed rest Code Status: full code Family Communication: updated family this morning at bedside  Disposition:  ICU  LABS    PULMONARY Recent Labs  Lab 10/20/20 0359  PHART 7.302*  PCO2ART 42.6  PO2ART 76*  HCO3 21.1  TCO2 22  O2SAT 94.0   CBC Recent Labs  Lab 10/24/20 0406 10/25/20 0213 10/26/20 0500  HGB 9.1* 8.4* 8.8*  HCT 28.2* 26.3* 26.8*  WBC 13.8* 12.7* 12.5*  PLT 152 154 204   COAGULATION Recent Labs  Lab 10/21/20 0139 10/23/2020 0446  INR 1.7* 1.3*   CARDIAC  No results for input(s): TROPONINI in the last 168 hours. No results for input(s): PROBNP in the last 168 hours.  CHEMISTRY Recent Labs  Lab 10/20/20 1610 10/21/20 1737 10/08/2020 0446 10/20/2020 0446 10/23/20 0536 10/23/20 0536 10/24/20 0750 10/24/20 0750 10/25/20 0213 10/25/20 0213 10/25/20 0741 10/26/20 0500  NA 146*   < > 144   < > 139  --  142  --  139  --  142 144  K 4.0   < > 4.7   < > 5.3*   < > 3.6   < > 4.1   < > 3.5 2.8*  CL 119*   < > 120*   < > 115*  --  110  --  105  --  109 106  CO2 19*   < > 16*    < > 16*  --  19*  --  21*  --  21* 24  GLUCOSE 341*   < > 116*   < > 225*  --  224*  --  563*  --  130* 165*  BUN 35*   < > 61*   < > 64*  --  73*  --  72*  --  77* 80*  CREATININE 1.20   < > 2.74*   < > 2.96*  --  2.88*  --  2.56*  --  2.67* 2.49*  CALCIUM 7.5*   < > 6.8*   < > 7.0*  --  7.1*  --  7.6*  --  7.5* 7.8*  MG 2.1  --  2.0  --   --   --   --   --  2.3  --  2.1 2.0  PHOS 3.7  --  4.9*  --  6.3*  --   --   --  2.2*  --  2.4* 2.9   < > = values in this interval not displayed.   Estimated Creatinine Clearance: 23.5 mL/min (A) (by C-G formula based on SCr of 2.49 mg/dL (H)).  LIVER Recent Labs  Lab 10/21/20 0139 10/21/20 0627 10/21/20 1737 2020-11-16 0446 10/25/20 0213 10/25/20 0741 10/26/20 0500  AST  --   --  144* 170* 126* 103* 67*  ALT  --   --  80* 99* 201* 184* 128*  ALKPHOS  --   --  70 67 74 83 90  BILITOT  --   --  1.6* 1.7* 1.7* 2.1* 2.1*  PROT  --    < > 5.0* 4.4* 4.6* 5.1* 5.3*  ALBUMIN  --   --  1.5* 1.3* 1.1* 1.1* 1.1*  INR 1.7*  --   --  1.3*  --   --   --    < > = values in this interval not displayed.   INFECTIOUS Recent Labs  Lab 10/20/20 0632  PROCALCITON 1.96   ENDOCRINE CBG (last 3)  Recent Labs    10/25/20 2351 10/26/20 0407 10/26/20 0735  GLUCAP 113* 171* 135*    This patient is critically ill with multiple organ system failure; which, requires frequent high complexity decision making, assessment, support, evaluation, and titration of therapies. This was completed through the application of advanced monitoring technologies and extensive interpretation of multiple databases. During this encounter critical care time was devoted to patient care services described in this note for 34 minutes.  Josephine Igo, DO [facility] 10/26/2020 8:45 AM

## 2020-10-26 NOTE — Evaluation (Signed)
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  Hospitalization complicated by hypoxic respiratory failure secondary to pulmonary edema requiring BiPAP and lasix. On 10/10/2020 he became more lethargic and tachycardic with tachypnea.  Patient went into A. fib with RVR ,also had a fever. Pt with sepsis and RLL PNA.  Placement of drain for R retroperitoneal abscess on 11/14, contained bowel perforation.  Pt with continued abdominal pain , he had NGtube added on 10/18/20.11/20 intubated with thoracentesis, VATS 11/22    Clinical Impression  Pt with flat affect and willing to participate. Pt with report of dizziness throughout EOB without spinning. Pt stated decreased dizziness but still maintained with HOB elevated end of session. Pt with left hemiplegia with subluxation of humerus and requires max +2 assist for all mobility with sitting EOB 8 min prior to fatigue limiting mobility. Pt with decreased strength, balance, cognition, limited by communication barrier with dialect not available and family interpreting. Pt will benefit from acute therapy to maximize mobility, safety and function to decrease burden of care.    Supine 170/96 EOB 176/85 Return to supine with HOB 30 degrees 155/82   Follow Up Recommendations SNF;Supervision/Assistance - 24 hour    Equipment Recommendations  3in1 (PT);Wheelchair (measurements PT);Wheelchair cushion (measurements PT);Hospital bed    Recommendations for Other Services       Precautions / Restrictions Precautions Precautions: Fall Precaution Comments: L hemi; watch O2; R flank JP drain; L chest tube; TPN Restrictions Weight Bearing Restrictions: No      Mobility  Bed  Mobility Overal bed mobility: Needs Assistance Bed Mobility: Supine to Sit;Sit to Supine     Supine to sit: Max assist;+2 for physical assistance;+2 for safety/equipment Sit to supine: Max assist;+2 for physical assistance;+2 for safety/equipment   General bed mobility comments: assist to initiate BLEs and to support trunk elevation into upright sitting; bed pads used to scoot out to EOB. Able to sit ~45mins with dizziness and VSS    Transfers                 General transfer comment: did not attempt secondary to dizziness and poor balance with pt fatigued EOB  Ambulation/Gait                Stairs            Wheelchair Mobility    Modified Rankin (Stroke Patients Only) Modified Rankin (Stroke Patients Only) Pre-Morbid Rankin Score: No symptoms Modified Rankin: Severe disability     Balance Overall balance assessment: Needs assistance Sitting-balance support: Feet supported;Bilateral upper extremity supported Sitting balance-Leahy Scale: Poor Sitting balance - Comments: brief moments of min A but not able to activate trunk consistently; mostly max A with posterior lean and neck flexion max assist for neck extension Postural control: Posterior lean                                   Pertinent Vitals/Pain Pain Assessment: No/denies pain    Home Living Family/patient expects to be discharged to:: Private residence Living Arrangements: Spouse/significant other;Children Available Help at Discharge: Family;Available 24 hours/day Type of Home: House Home Access: Stairs to enter Entrance Stairs-Rails: Can reach both Entrance Stairs-Number of  Steps: 7-8 Home Layout: One level Home Equipment: None Additional Comments: taken from last evaluation    Prior Function Level of Independence: Independent         Comments: Independent without AD, 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.Since admission max-total +2 assist      Hand Dominance        Extremity/Trunk Assessment   Upper Extremity Assessment Upper Extremity Assessment: Defer to OT evaluation LUE Deficits / Details: flaccid; 2 finger sublux    Lower Extremity Assessment Lower Extremity Assessment: LLE deficits/detail LLE Deficits / Details: no active movement of LLE during activity, pt moving RLE spontaneously    Cervical / Trunk Assessment Cervical / Trunk Assessment:  (cervical kyphosis, rounded shoulders)  Communication   Communication: Receptive difficulties;Expressive difficulties;Other (comment);Prefers language other than English  Cognition Arousal/Alertness: Awake/alert Behavior During Therapy: Flat affect Overall Cognitive Status: Difficult to assess                                 General Comments: difficult to assess this session, daughter present to translate and pt minimally verbal. Following simple one step commands during mobility and ADL per daughter      General Comments      Exercises General Exercises - Lower Extremity Long Arc Quad: PROM;AROM;Left;Right;Seated;10 reps (AROm on RLE, PROM LLE)   Assessment/Plan    PT Assessment Patient needs continued PT services  PT Problem List Decreased strength;Decreased range of motion;Decreased activity tolerance;Decreased balance;Decreased mobility;Decreased coordination;Decreased cognition;Decreased safety awareness       PT Treatment Interventions DME instruction;Gait training;Stair training;Functional mobility training;Therapeutic activities;Therapeutic exercise;Balance training;Neuromuscular re-education;Patient/family education    PT Goals (Current goals can be found in the Care Plan section)  Acute Rehab PT Goals Patient Stated Goal: pt to return home PT Goal Formulation: With family Time For Goal Achievement: 11/09/20 Potential to Achieve Goals: Fair    Frequency Min 3X/week   Barriers to discharge        Co-evaluation PT/OT/SLP  Co-Evaluation/Treatment: Yes Reason for Co-Treatment: Complexity of the patient's impairments (multi-system involvement);For patient/therapist safety PT goals addressed during session: Mobility/safety with mobility;Balance OT goals addressed during session: ADL's and self-care;Strengthening/ROM       AM-PAC PT "6 Clicks" Mobility  Outcome Measure Help needed turning from your back to your side while in a flat bed without using bedrails?: Total Help needed moving from lying on your back to sitting on the side of a flat bed without using bedrails?: Total Help needed moving to and from a bed to a chair (including a wheelchair)?: Total Help needed standing up from a chair using your arms (e.g., wheelchair or bedside chair)?: Total Help needed to walk in hospital room?: Total Help needed climbing 3-5 steps with a railing? : Total 6 Click Score: 6    End of Session Equipment Utilized During Treatment: Oxygen Activity Tolerance: Patient limited by fatigue Patient left: with call bell/phone within reach;in bed;with family/visitor present;with bed alarm set Nurse Communication: Mobility status PT Visit Diagnosis: Other abnormalities of gait and mobility (R26.89);Muscle weakness (generalized) (M62.81);Hemiplegia and hemiparesis;Unsteadiness on feet (R26.81);Difficulty in walking, not elsewhere classified (R26.2);Other symptoms and signs involving the nervous system (R29.898) Hemiplegia - dominant/non-dominant: Non-dominant Hemiplegia - caused by: Cerebral infarction    Time: 9381-8299 PT Time Calculation (min) (ACUTE ONLY): 32 min   Charges:   PT Evaluation $PT Eval Moderate Complexity: 1 Mod  Merryl Hacker, PT Acute Rehabilitation Services Pager: [phone] Office: [phone]   Enedina Finner Shalia Bartko 10/26/2020, 1:31 PM

## 2020-10-26 NOTE — Progress Notes (Signed)
Pharmacy reported that Heparin level is undetectable. No signs of leakage or infiltration from the PIV it is being infused into. Per request, an additional PIV has been placed on left hand. Heparin gtt is now infusing through the new PIV on left hand. Will continue to monitor sites for signs of complications and will redraw Heparin levels at 0700.   Barbaraann Cao, RN  10/26/2020 [phone]

## 2020-10-26 NOTE — Progress Notes (Signed)
eLink Physician-Brief Progress Note Patient Name: Mark May DOB: June 30, 1956 MRN: [id number]   Date of Service  10/26/2020  HPI/Events of Note  Pharmacy notified me that the patient has been subtherapeutic on his heparin drip for several days now. They ask if we should switch to a different A/C agent.    eICU Interventions  His anticoagulation is for Afib. He has had a complicated hospital course including hemothorax. I think this is an issue best decided by the primary ICU team so will defer any change at this time.     Intervention Category Intermediate Interventions: Other:  Janae Bridgeman 10/26/2020, 10:10 PM

## 2020-10-26 NOTE — Progress Notes (Signed)
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  Hospitalization complicated by hypoxic respiratory failure secondary to pulmonary edema requiring BiPAP and lasix. On 10/10/2020 he became more lethargic and tachycardic with tachypnea.  Patient went into A. fib with RVR ,also had a fever. Pt with sepsis and RLL PNA.  Placement of drain for R retroperitoneal abscess on 11/14, contained bowel perforation.  Pt with continued abdominal pain , he had NGtube added on 10/18/20.11/20 intubated with thoracentesis, VATS 11/22   OT comments  Pt seen for additional OT session to address L hemiplegic shoulder. Kinesiotape used in formation to promote shoulder approximation (to reduce sublux) and retraction. This is used to facilitate normal positioning to prevent pain, deformity, and to improve ability to engage in ADL. Pt and daughter educated on use of K tape. Also discussed if there is an adverse reaction to remove tape. RN also aware of this. Will continue to follow per POC listed below.   Follow Up Recommendations  CIR;Supervision/Assistance - 24 hour    Equipment Recommendations  Wheelchair (measurements OT);Wheelchair cushion (measurements OT);Hospital bed;3 in 1 bedside commode    Recommendations for Other Services Rehab consult    Precautions / Restrictions Precautions Precautions: Fall Precaution Comments: L hemi; watch O2; R flank JP drain; L chest tube; TPN Restrictions Weight Bearing Restrictions: No       Mobility Bed Mobility Overal bed mobility: Needs Assistance Bed Mobility: Supine to Sit;Sit to Supine     Supine to sit: Max assist;+2 for physical assistance;+2 for safety/equipment Sit to  supine: Max assist;+2 for physical assistance;+2 for safety/equipment   General bed mobility comments: max A to reposition for K tape placement on L shoulder  Transfers                 General transfer comment: did not attempt secondary to dizziness and poor balance with pt fatigued EOB    Balance Overall balance assessment: Needs assistance Sitting-balance support: Feet supported;Bilateral upper extremity supported Sitting balance-Leahy Scale: Poor Sitting balance - Comments: brief moments of min A but not able to activate trunk consistently; mostly max A with posterior lean and neck flexion max assist for neck extension Postural control: Posterior lean                                 ADL either performed or assessed with clinical judgement   ADL     Eating/Feeding Details (indicate cue type and reason): TPN Grooming: Set up;Sitting Grooming Details (indicate cue type and reason): to wash face with RUE while sitting EOB                               General ADL Comments: session focused on K tape application for L shoulder facilitation technique     Vision   Additional Comments: R gaze preference   Perception     Praxis      Cognition Arousal/Alertness: Awake/alert Behavior During Therapy: Flat affect Overall Cognitive Status: Difficult to assess  General Comments: difficult to assess this session, daughter present to translate and pt minimally verbal. Following simple one step commands during mobility and ADL per daughter        Exercises General Exercises - Lower Extremity Long Arc Quad: PROM;AROM;Left;Right;Seated;10 reps (AROm on RLE, PROM LLE) Other Exercises Other Exercises: LUE: shoulder retraction x5; attempts with shoulder elevation with little success   Shoulder Instructions       General Comments      Pertinent Vitals/ Pain       Pain Assessment: No/denies pain  Home  Living Family/patient expects to be discharged to:: Private residence Living Arrangements: Spouse/significant other;Children Available Help at Discharge: Family;Available 24 hours/day Type of Home: House Home Access: Stairs to enter [employer] of Steps: 7-8 Entrance Stairs-Rails: Can reach both Home Layout: One level               Home Equipment: None   Additional Comments: taken from last evaluation      Prior Functioning/Environment Level of Independence: Independent        Comments: Independent without AD, 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.Since admission max-total +2 assist   Frequency  Min 2X/week        Progress Toward Goals  OT Goals(current goals can now be found in the care plan section)  Progress towards OT goals: Progressing toward goals  Acute Rehab OT Goals Patient Stated Goal: pt to return home OT Goal Formulation: Patient unable to participate in goal setting Time For Goal Achievement: 11/09/20 Potential to Achieve Goals: Good ADL Goals Pt Will Perform Grooming: with modified independence;sitting Pt Will Perform Upper Body Dressing: with mod assist;sitting Pt Will Transfer to Toilet: with mod assist;with +2 assist;squat pivot transfer;bedside commode Additional ADL Goal #1: Pt will visually scan and locate 2 objects in L visual field with mod VC's Additional ADL Goal #2: Pt will follow 2 step ADL commands with 75% accuracy Additional ADL Goal #3: Pt will sit EOB with min A for 5 mins while using LUE as dependent stabilizer  Plan Discharge plan remains appropriate;Frequency remains appropriate    Co-evaluation      Reason for Co-Treatment: Complexity of the patient's impairments (multi-system involvement);For patient/therapist safety PT goals addressed during session: Mobility/safety with mobility;Balance OT goals addressed during session: ADL's and self-care;Strengthening/ROM      AM-PAC OT "6 Clicks"  Daily Activity     Outcome Measure   Help from another person eating meals?: Total (TPN) Help from another person taking care of personal grooming?: A Little Help from another person toileting, which includes using toliet, bedpan, or urinal?: Total Help from another person bathing (including washing, rinsing, drying)?: Total Help from another person to put on and taking off regular upper body clothing?: Total Help from another person to put on and taking off regular lower body clothing?: Total 6 Click Score: 8    End of Session Equipment Utilized During Treatment: Oxygen  OT Visit Diagnosis: Unsteadiness on feet (R26.81);Muscle weakness (generalized) (M62.81);Other symptoms and signs involving cognitive function;Hemiplegia and hemiparesis Hemiplegia - Right/Left: Left Hemiplegia - dominant/non-dominant: Non-Dominant Hemiplegia - caused by: Cerebral infarction   Activity Tolerance Patient tolerated treatment well   Patient Left in bed;with call bell/phone within reach;with nursing/sitter in room   Nurse Communication Mobility status        Time: 6226-3335 OT Time Calculation (min): 10 min  Charges: OT General Charges $OT Visit: 1 Visit OT Evaluation $OT Eval High Complexity: 1 High OT  Treatments $Therapeutic Exercise: 8-22 mins  Dalphine Handing, MSOT, OTR/L Acute Rehabilitation Services [facility] Office Number: [phone] Pager: [phone]  Dalphine Handing 10/26/2020, 1:46 PM

## 2020-10-26 NOTE — Progress Notes (Signed)
T[facility] DAILY ICU PROGRESS NOTE                   [address]            [name]          [phone]   4 Days Post-Op Procedure(s) (LRB): VIDEO ASSISTED THORACOSCOPY (VATS)/DECORTICATION (Left)  Total Length of Stay:  LOS: 21 days   Subjective: Patient on  vent. Son at bedside.  Objective: Vital signs in last 24 hours: Temp:  [98.4 F (36.9 C)-100.1 F (37.8 C)] 99.5 F (37.5 C) (11/26 0406) Pulse Rate:  [73-97] 84 (11/26 0700) Cardiac Rhythm: Normal sinus rhythm (11/26 0400) Resp:  [15-25] 20 (11/26 0700) BP: (118-182)/(60-96) 154/80 (11/26 0700) SpO2:  [96 %-100 %] 100 % (11/26 0700) FiO2 (%):  [30 %] 30 % (11/26 0334) Weight:  [56.1 kg] 56.1 kg (11/26 0500)  Filed Weights   10/14/2020 0100 10/25/20 0340 10/26/20 0500  Weight: 59.3 kg 59.3 kg 56.1 kg      Intake/Output from previous day: 11/25 0701 - 11/26 0700 In: 3709.8 [I.V.:3463.6; IV Piggyback:246.2] Out: 6890 [Urine:6300; Emesis/NG output:220; Drains:50; Chest Tube:320]  Intake/Output this shift: No intake/output data recorded.  Current Meds: Scheduled Meds: .  stroke: mapping our early stages of recovery book   Does not apply Once  . aspirin  300 mg Rectal Daily  . chlorhexidine gluconate (MEDLINE KIT)  15 mL Mouth Rinse BID  . Chlorhexidine Gluconate Cloth  6 each Topical Daily  . insulin aspart  3-9 Units Subcutaneous Q4H  . mouth rinse  15 mL Mouth Rinse 10 times per day  . pantoprazole (PROTONIX) IV  40 mg Intravenous Q12H  . potassium chloride  40 mEq Per NG tube Once  . sodium chloride flush  10-40 mL Intracatheter Q12H  . sodium chloride flush  5 mL Intracatheter Q8H   Continuous Infusions: . sodium chloride Stopped (10/11/2020 1300)  . dexmedetomidine (PRECEDEX) IV infusion 0.4 mcg/kg/hr (10/26/20 0700)  . fentaNYL infusion INTRAVENOUS 50 mcg/hr (10/24/20 1435)  . fluconazole (DIFLUCAN) IV Stopped (10/25/20 1551)  . heparin 1,400 Units/hr (10/26/20 0700)  .  norepinephrine (LEVOPHED) Adult infusion    . piperacillin-tazobactam (ZOSYN)  IV 12.5 mL/hr at 10/26/20 0700  . potassium chloride    . TPN ADULT (ION) 90 mL/hr at 10/26/20 0700  . vasopressin Stopped (10/25/20 0515)   PRN Meds:.sodium chloride, acetaminophen, dextrose, fentaNYL (SUBLIMAZE) injection, sodium chloride flush  General appearance: no distress Heart: RRR Lungs: Mostly clear bilaterally Abdomen: Soft,  sporadic bowel sounds Extremities: SCDs in place Wound: Left chest tube dressing is clean and dry Chest tube: Bloody drainage, to suction, no air leak  Lab Results: CBC: Recent Labs    10/25/20 0213 10/26/20 0500  WBC 12.7* 12.5*  HGB 8.4* 8.8*  HCT 26.3* 26.8*  PLT 154 204   BMET:  Recent Labs    10/25/20 0741 10/26/20 0500  NA 142 144  K 3.5 2.8*  CL 109 106  CO2 21* 24  GLUCOSE 130* 165*  BUN 77* 80*  CREATININE 2.67* 2.49*  CALCIUM 7.5* 7.8*    CMET: Lab Results  Component Value Date   WBC 12.5 (H) 10/26/2020   HGB 8.8 (L) 10/26/2020   HCT 26.8 (L) 10/26/2020   PLT 204 10/26/2020   GLUCOSE 165 (H) 10/26/2020   CHOL 173 10/06/2020   TRIG 123 10/18/2020   HDL 33 (L) 10/06/2020   LDLCALC 85 10/06/2020   ALT 128 (  H) 10/26/2020   AST 67 (H) 10/26/2020   NA 144 10/26/2020   K 2.8 (L) 10/26/2020   CL 106 10/26/2020   CREATININE 2.49 (H) 10/26/2020   BUN 80 (H) 10/26/2020   CO2 24 10/26/2020   INR 1.3 (H) 10/11/2020   HGBA1C 5.8 (H) 10/06/2020     PT/INR:  No results for input(s): LABPROT, INR in the last 72 hours. Radiology: No results found.  Assessment/Plan: S/P Procedure(s) (LRB): VIDEO ASSISTED THORACOSCOPY (VATS)/DECORTICATION (Left) 1. CV-PAF with RVR.  2. Pulmonary-Acute respiratory failure, likely secondary to aspiration PNA. On vent. Vent management per CCM. Chest tube with 320 cc since surgery. Chest tube to remain for now. Check CXR in am.  3. GI-s/p CT guided drainage of right retroperitoneal abscess (per general surgery,  duodenum is most likely site of perforation)  11/14. NPO, TPN. 4. ID-on Zosyn for RRP abscess, Klebsiella left pleural fluid 5. AKI-Creatinine slightly decreased to 2.49. Nephrology following 6. Supplement potassium carefully per primary  Mark May Liston Alba PA-C 10/26/2020 7:41 AM

## 2020-10-26 NOTE — Progress Notes (Signed)
[facility] Progress Note  Name: Mark May MRN: [id number] DOB: 1956-08-06  Chief Complaint:  Initially presented w/left sided weakness   Subjective:  Patient remains intubated.  Excellent urine output over the past 24 hours.  Planning for extubation today.  Review of systems:  Unable to obtain 2/2 intubated     Intake/Output Summary (Last 24 hours) at 10/26/2020 1015 Last data filed at 10/26/2020 0900 Gross per 24 hour  Intake 3605.55 ml  Output 7390 ml  Net -3784.45 ml    Vitals:  Vitals:   10/26/20 0815 10/26/20 0830 10/26/20 0900 10/26/20 1000  BP: (!) 156/81 (!) 151/83 (!) 154/77 (!) 156/78  Pulse: 84 83 89 80  Resp: (!) 28 (!) 31 (!) 22 20  Temp:      TempSrc:      SpO2: 98% 100% 99% 100%  Weight:         Physical Exam:   General: Adult male in bed intubated HEENT: Normocephalic atraumatic Eyes: sclera anicteric tracks visually Neck: Trachea midline normal neck circumference Heart: S1-S2 no rub appreciated Lungs: coarse mechanical breath sounds Abdomen: Softly distended no bowel sounds Extremities: 1-2+ edema of upper and lower extremities GU - foley in place with uop  Medications reviewed    Labs:  BMP Latest Ref Rng & Units 10/26/2020 10/25/2020 10/25/2020  Glucose 70 - 99 mg/dL 737(T) 062(I) 948(NI)  BUN 8 - 23 mg/dL 62(V) 03(J) 00(X)  Creatinine 0.61 - 1.24 mg/dL 3.81(W) 2.99(B) 7.16(R)  BUN/Creat Ratio 10 - 24 - - -  Sodium 135 - 145 mmol/L 144 142 139  Potassium 3.5 - 5.1 mmol/L 2.8(L) 3.5 4.1  Chloride 98 - 111 mmol/L 106 109 105  CO2 22 - 32 mmol/L 24 21(L) 21(L)  Calcium 8.9 - 10.3 mg/dL 7.8(L) 7.5(L) 7.6(L)     Assessment/Plan:   # Acute kidney injury - Multifactorial ATN in the setting of hypotension and ischemic injury from anemia as well as contrast on 11/21 (CTA C/a/p).  Note no hydro on CT and multiple nonobstructing right renal calculi.   RBC noted on UA; neg protein on UA with up/cr ratio of 2050 mg/g  -  Improving with supportive care   -Excellent response to Lasix and creatinine even improved - pharmacy is dosing zosyn - Repeat UA - RBC's but note stones  # Metabolic acidosis - Setting of shock, AKI - Improved - on bowel rest - can't take bicarb PO and we have intermittently given IV bicarb amps  # Acute hypoxic respiratory failure - on mechanical ventilation per pulm.  Plan for extubation today  # Shock - multifactorial - resolved - note abscess, acute blood loss - now off pressors - antibiotics per primary team   # Anemia secondary to acute blood loss - Continue supportive care with transfusions as needed   # Bowel perforation 2/2 cortrack - note on TPN currently   # Retroperitoneal abscess - abx per primary  - s/p drain placement    # Acute right MCA CVA s/p thrombectomy and right MCA stent placement - per primary team  - on ASA per primary   # A fib with RVR - noted; rate control per primary team   # proteinuria  - Note pre-DM  - Would f/u once AKI resolved  #Hypokalemia: Oral and IV repletion today.  #Hypoalbuminemia: Likely related to severe illness and poor nutrition.  Could consider IV albumin with a goal of 2.5 particularly if creatinine begins to worsen again  Given  the patient's improvement we will sign off at this time.  Please contact nephrology if we are needed to get in the future.  Darnell Level, MD 10/26/2020  10:15 AM

## 2020-10-26 NOTE — Progress Notes (Signed)
Informed Dr. Francena Hanly with general surgery that NGT came out when patient was extubated and that patient was refusing to have it replaced. Dr. Francena Hanly stated that it was okay to leave the NGT out at this time.

## 2020-10-26 NOTE — Progress Notes (Signed)
PHARMACY - TOTAL PARENTERAL NUTRITION CONSULT NOTE  Indication: Perforated duodenum  Patient Measurements: Weight: 56.1 kg (123 lb 10.9 oz)   Body mass index is 19.37 kg/m. Usual Weight: 120-125 lbs  Assessment:  64 YOM presented on 10/17/2020 with right MCA occlusion and underwent revascularization. PMH significant for PUD post partial gastrectomy in 1990. Patient was started on a dysphagia diet on 10/08/20 and also transferred out of the ICU. Post-op course complicated by Afib RVR, aspiration PNA/cellulitis and AKI. He was then started on TF on 10/12/20, which ended on 10/13/20 when CT showed perinephric abscess with gas formation. Drain was placed and there is concern for fistulous communication to the colon on 10/16/20 CT. Nutrition has been inadequate since admission, and Pharmacy consulted for TPN management. Per patient's son, patient has no recent weight loss and was eating a balanced diet PTA.  11/25 - Patient off vasopressors and on minimal sedation. May consider extubation today per notes.  Glucose / Insulin: no hx DM - CBGs now controlled. Levemir 10 units BID added 11/24 + utilized 21 units Novolog total yesterday (25 units in bag + SSI). Insulin drip d/c'd 11/24 PM Electrolytes: K down to 2.8 - given 3 runs, no lasix planned, CO2 up to 24 (max acetate in TPN), Phos 2.9 (removed from TPN), now slightly low 2.4; others stable NWL Renal: SCr trend down to 2.67>2.49 LFTs / TGs:  AST down to 103 / ALT up to 184, Tbili up to 2.1, TG normalized on 11/22 Prealbumin / albumin: albumin 1.1, prealbumin 9.4>8.2 Intake / Output; MIVF: UOP 4.7 ml/kg/hr, drain output , chest tube output 330 mL, NGT output 220 mLs; net +6.4L this admit (lasix 60mg  IV x 1 scheduled today) GI Imaging: 11/20 CT - large IA fluid and blood in LUQ worrisome for active bleeding, renal cysts, cannot exclude fistulous communication with large bowel  Pending GI study vs CT to r/o further leak before initiating  TF  Surgeries / Procedures: 11/20 - intubated, thoracentesis -- drained 12/20 cloudy, yellow appearing fluid 11/22 - VATS/decortication  Central access: PICC 10/17/20 TPN start date: 10/17/20  Nutritional Goals (per RD rec on 11/23): 1950 - 2240 kCal, 115 - 130g protein; >1.8L fluid per day  Goal TPN rate is 90 ml/hr - will provide 122g protein, 292g CHO, and 67g SMOF lipids, for total 2150 kCal  Current Nutrition:  TPN; NPO  Plan:  Continue TPN at goal rate 90 mL/hr at 1800 Electrolytes in TPN: continue Phos 5 mmol/L; Na 13mEq/L, K 20 mEq/L increase slightly today to 30 (No diuresis today so will not be aggressive), Ca 56mEq/L, Mag 62mEq/L, 2:1 acetate (go to 1:1 is CO2 remains stable) Continue standard MVI and trace elements to TPN Continue ICU resistant scale SSI + add 25 units regular insulin to TPN bag and adjust as needed - none today Monitor Surgery plans, TPN labs, I/O's  4m, PharmD, BCPS, BCCCP Clinical Pharmacist [phone]  Please check AMION for all [facility] numbers  10/26/2020 8:19 AM

## 2020-10-26 NOTE — Plan of Care (Signed)
  Problem: Education: Goal: Knowledge of General Education information will improve Description: Including pain rating scale, medication(s)/side effects and non-pharmacologic comfort measures Outcome: Progressing   Problem: Health Behavior/Discharge Planning: Goal: Ability to manage health-related needs will improve Outcome: Progressing   Problem: Clinical Measurements: Goal: Ability to maintain clinical measurements within normal limits will improve Outcome: Progressing Goal: Will remain free from infection Outcome: Progressing Goal: Diagnostic test results will improve Outcome: Progressing Goal: Respiratory complications will improve Outcome: Progressing Goal: Cardiovascular complication will be avoided Outcome: Progressing   Problem: Activity: Goal: Risk for activity intolerance will decrease Outcome: Progressing   Problem: Nutrition: Goal: Adequate nutrition will be maintained Outcome: Progressing   Problem: Coping: Goal: Level of anxiety will decrease Outcome: Progressing   Problem: Elimination: Goal: Will not experience complications related to bowel motility Outcome: Progressing Goal: Will not experience complications related to urinary retention Outcome: Progressing   Problem: Pain Managment: Goal: General experience of comfort will improve Outcome: Progressing   Problem: Safety: Goal: Ability to remain free from injury will improve Outcome: Progressing   Problem: Skin Integrity: Goal: Risk for impaired skin integrity will decrease Outcome: Progressing   Problem: Education: Goal: Knowledge of disease or condition will improve Outcome: Progressing Goal: Knowledge of secondary prevention will improve Outcome: Progressing Goal: Knowledge of patient specific risk factors addressed and post discharge goals established will improve Outcome: Progressing Goal: Individualized Educational Video(s) Outcome: Progressing   Problem: Coping: Goal: Will verbalize  positive feelings about self Outcome: Progressing Goal: Will identify appropriate support needs Outcome: Progressing   Problem: Health Behavior/Discharge Planning: Goal: Ability to manage health-related needs will improve Outcome: Progressing   Problem: Self-Care: Goal: Ability to participate in self-care as condition permits will improve Outcome: Progressing Goal: Verbalization of feelings and concerns over difficulty with self-care will improve Outcome: Progressing Goal: Ability to communicate needs accurately will improve Outcome: Progressing   Problem: Nutrition: Goal: Risk of aspiration will decrease Outcome: Progressing Goal: Dietary intake will improve Outcome: Progressing   Problem: Ischemic Stroke/TIA Tissue Perfusion: Goal: Complications of ischemic stroke/TIA will be minimized Outcome: Progressing   Problem: Fluid Volume: Goal: Hemodynamic stability will improve Outcome: Progressing   Problem: Clinical Measurements: Goal: Diagnostic test results will improve Outcome: Progressing Goal: Signs and symptoms of infection will decrease Outcome: Progressing   Problem: Respiratory: Goal: Ability to maintain adequate ventilation will improve Outcome: Progressing   

## 2020-10-26 NOTE — Progress Notes (Signed)
eLink Physician-Brief Progress Note Patient Name: Lucy Woolever DOB: 1956-04-04 MRN: [id number]   Date of Service  10/26/2020  HPI/Events of Note  K+ 2.8, creatinine 2.49  eICU Interventions  KCL 10 meq iv Q 1 hour x 3.        Thomasene Lot Aisa Schoeppner 10/26/2020, 7:06 AM

## 2020-10-26 NOTE — Evaluation (Signed)
Occupational Therapy Re-Evaluation Patient Details Name: Mark May MRN: [id number] DOB: 07-19-1956 Today's Date: 10/26/2020    History of Present Illness Pt is 64 year old male with hypertension and hyperlipidemia who presented with left-sided weakness, noted to have right MCA occlusion status post thrombectomy and stent placement 11/5.  Hospitalization complicated by hypoxic respiratory failure secondary to pulmonary edema requiring BiPAP and lasix. On 10/10/2020 he became more lethargic and tachycardic with tachypnea.  Patient went into A. fib with RVR ,also had a fever. Pt with sepsis and RLL PNA.  Placement of drain for R retroperitoneal abscess on 11/14, contained bowel perforation.  Pt with continued abdominal pain , he had NGtube added on 10/18/20.11/20 intubated with thoracentesis, VATS 11/22   Clinical Impression   Pt seen for OT re-evaluation, known to our service from original admit for R MCA. Pt now presenting with increased generalized weakness post intubation with bed rest. Pt just extubated this AM. He currently requires max A +2 to perform bed mobility, and was able to sit EOB ~8 mins with fluctuating min-max A for trunk support. He reports persistent dizziness, VSS. He was then able to wash his face with set up assist using RUE. LUE remains flaccid- noted 2 finger sublux in GH joint. Will assess appropriateness for taping. Continue to recommend CIR at d/c. Will continue to follow per POC listed below.    Follow Up Recommendations  CIR;Supervision/Assistance - 24 hour    Equipment Recommendations  Wheelchair (measurements OT);Wheelchair cushion (measurements OT);Hospital bed;3 in 1 bedside commode    Recommendations for Other Services       Precautions / Restrictions Precautions Precautions: Fall Precaution Comments: L hemi; watch O2; R flank JP drain; L chest tube; TPN Restrictions Weight Bearing Restrictions: No      Mobility Bed Mobility Overal bed mobility: Needs  Assistance Bed Mobility: Supine to Sit;Sit to Supine     Supine to sit: Max assist;+2 for physical assistance;+2 for safety/equipment Sit to supine: Max assist;+2 for physical assistance;+2 for safety/equipment   General bed mobility comments: assist to initiate BLEs and to support trunk elevation into upright sitting; bed pads used to scoot out to EOB. Able to sit ~36mins with dizziness and VSS    Transfers                      Balance Overall balance assessment: Needs assistance Sitting-balance support: Feet supported;Bilateral upper extremity supported Sitting balance-Leahy Scale: Poor Sitting balance - Comments: brief moments of min A but not able to activate trunk consistently; mostly max A with posterior lean Postural control: Posterior lean                                 ADL either performed or assessed with clinical judgement   ADL     Eating/Feeding Details (indicate cue type and reason): TPN Grooming: Set up;Sitting Grooming Details (indicate cue type and reason): to wash face with RUE while sitting EOB                               General ADL Comments: otherwise total A for all ADL at this time. Able to tolerate sitting EOB ~8 mins. +for dizziness, VSS     Vision   Additional Comments: R gaze preference     Perception     Praxis      Pertinent  Vitals/Pain Pain Assessment: No/denies pain     Hand Dominance     Extremity/Trunk Assessment Upper Extremity Assessment Upper Extremity Assessment: LUE deficits/detail LUE Deficits / Details: flaccid; 2 finger sublux   Lower Extremity Assessment Lower Extremity Assessment: Defer to PT evaluation       Communication Communication Communication: Receptive difficulties;Expressive difficulties;Other (comment);Prefers language other than English   Cognition Arousal/Alertness: Awake/alert Behavior During Therapy: Flat affect Overall Cognitive Status: Difficult to assess                                  General Comments: difficult to assess this session, daughter present to translate and pt minimally verbal. Following simple one step commands during mobility and ADL per daughter   General Comments       Exercises     Shoulder Instructions      Home Living Family/patient expects to be discharged to:: Private residence Living Arrangements: Spouse/significant other;Children Available Help at Discharge: Family;Available 24 hours/day Type of Home: House Home Access: Stairs to enter [employer] of Steps: 7-8 Entrance Stairs-Rails: Can reach both Home Layout: One level               Home Equipment: None   Additional Comments: taken from last evaluation      Prior Functioning/Environment Level of Independence: Independent                 OT Problem List: Decreased strength;Decreased activity tolerance;Decreased range of motion;Impaired balance (sitting and/or standing);Impaired vision/perception;Decreased coordination;Decreased cognition;Decreased safety awareness;Decreased knowledge of use of DME or AE;Decreased knowledge of precautions;Impaired UE functional use;Pain      OT Treatment/Interventions: Self-care/ADL training;Therapeutic exercise;Neuromuscular education;Energy conservation;DME and/or AE instruction;Manual therapy;Modalities;Therapeutic activities;Cognitive remediation/compensation;Visual/perceptual remediation/compensation;Patient/family education;Balance training    OT Goals(Current goals can be found in the care plan section) Acute Rehab OT Goals OT Goal Formulation: Patient unable to participate in goal setting Time For Goal Achievement: 11/09/20 Potential to Achieve Goals: Good  OT Frequency: Min 2X/week   Barriers to D/C:            Co-evaluation   Reason for Co-Treatment: For patient/therapist safety;To address functional/ADL transfers   OT goals addressed during session: ADL's and  self-care;Strengthening/ROM      AM-PAC OT "6 Clicks" Daily Activity     Outcome Measure Help from another person eating meals?: Total (TPN) Help from another person taking care of personal grooming?: A Little Help from another person toileting, which includes using toliet, bedpan, or urinal?: Total Help from another person bathing (including washing, rinsing, drying)?: Total Help from another person to put on and taking off regular upper body clothing?: Total Help from another person to put on and taking off regular lower body clothing?: Total 6 Click Score: 8   End of Session Equipment Utilized During Treatment: Oxygen Nurse Communication: Mobility status  Activity Tolerance: Patient tolerated treatment well Patient left: in bed;with call bell/phone within reach;with nursing/sitter in room  OT Visit Diagnosis: Unsteadiness on feet (R26.81);Muscle weakness (generalized) (M62.81);Other symptoms and signs involving cognitive function;Hemiplegia and hemiparesis Hemiplegia - Right/Left: Left Hemiplegia - dominant/non-dominant: Non-Dominant Hemiplegia - caused by: Cerebral infarction                Time: 7371-0626 OT Time Calculation (min): 32 min Charges:  OT General Charges $OT Visit: 1 Visit OT Evaluation $OT Eval High Complexity: 1 High  Dalphine Handing, MSOT, OTR/L Acute Rehabilitation Services [facility] Office Number: [phone]  Pager: [phone]  Dalphine Handing 10/26/2020, 1:12 PM

## 2020-10-26 NOTE — Progress Notes (Signed)
Pt has L shoulder kinesiotape placed. Please remove if pt c/o discomfort or if there is redness/irriation around the site. OT will handle readjustments as necessary in sessions. Please do not remove unless it is causing discomfort to the patient. Please call or page with any questions.   Dalphine Handing, MSOT, OTR/L Acute Rehabilitation Services [facility] Office Number: [phone] Pager: [phone]

## 2020-10-26 NOTE — Progress Notes (Signed)
     [address]       [place] [address]             [phone]       Pt extubated Will plan to remove CT once output is less than  Mark May O Mark May

## 2020-10-26 NOTE — Progress Notes (Signed)
ANTICOAGULATION CONSULT NOTE  Pharmacy Consult for Heparin Indication: Afib, s/p CVA  No Known Allergies  Patient Measurements: Weight: 56.1 kg (123 lb 10.9 oz) Heparin Dosing Weight:  56.7 kg  Vital Signs: Temp: 97.8 F (36.6 C) (11/26 1200) Temp Source: Axillary (11/26 1200) BP: 162/88 (11/26 1300) Pulse Rate: 83 (11/26 1300)  Labs: Recent Labs    10/24/20 0406 10/24/20 0750 10/25/20 0213 10/25/20 0741 10/25/20 1356 10/25/20 2236 10/26/20 0500 10/26/20 0529 10/26/20 1137  HGB 9.1*   < > 8.4*  --   --   --  8.8*  --   --   HCT 28.2*  --  26.3*  --   --   --  26.8*  --   --   PLT 152  --  154  --   --   --  204  --   --   HEPARINUNFRC  --    < > <0.10*  --    < > <0.10*  --  <0.10* <0.10*  CREATININE  --    < > 2.56* 2.67*  --   --  2.49*  --   --    < > = values in this interval not displayed.    Estimated Creatinine Clearance: 23.5 mL/min (A) (by C-G formula based on SCr of 2.49 mg/dL (H)).  Assessment: 38 YOM who presented on 11/5 with R MCA stroke s/p IR + stent placement for reocclusion. Noted to have new onset Afib and pharmacy was consulted to dose heparin. Patient with concern for GIB and L hemothorax now s/p VATS procedure originally holding heparin now pharmacy has been consulted to resume. Aiming for lower goal of 0.3-0.5 d/t R MCA stroke and concern for GIB. CBC improved.  Heparin level has been undetectable since restart Wednesday. When he was on heparin 2 weeks ago, he was therapeutic on 800 units/hr  Currently on 1400 units/hr - which is a high unit/kg dose Wonder if patient has low AT3 which is a co-factor in heparin's mechanism of action  Goal of Therapy:  Heparin level 0.3 - 0.5 units/ml Monitor platelets by anticoagulation protocol: Yes    Plan:  Increase heparin gtt to 1600 units/hr Recheck at 2000  Will order an AT3 lvl as well - will need to consider alternative agent if low  Elmer Sow, PharmD, BCPS, BCCCP Clinical  Pharmacist [phone]  Please check AMION for all [facility] numbers  10/26/2020 1:52 PM

## 2020-10-27 ENCOUNTER — Inpatient Hospital Stay (HOSPITAL_COMMUNITY): Payer: 59

## 2020-10-27 DIAGNOSIS — I6601 Occlusion and stenosis of right middle cerebral artery: Secondary | ICD-10-CM | POA: Diagnosis not present

## 2020-10-27 LAB — BASIC METABOLIC PANEL
Anion gap: 13 (ref 5–15)
Anion gap: 11 (ref 5–15)
BUN: 66 mg/dL — ABNORMAL HIGH (ref 8–23)
BUN: 66 mg/dL — ABNORMAL HIGH (ref 8–23)
CO2: 26 mmol/L (ref 22–32)
CO2: 28 mmol/L (ref 22–32)
Calcium: 7.7 mg/dL — ABNORMAL LOW (ref 8.9–10.3)
Calcium: 7.9 mg/dL — ABNORMAL LOW (ref 8.9–10.3)
Chloride: 104 mmol/L (ref 98–111)
Chloride: 109 mmol/L (ref 98–111)
Creatinine, Ser: 1.83 mg/dL — ABNORMAL HIGH (ref 0.61–1.24)
Creatinine, Ser: 1.78 mg/dL — ABNORMAL HIGH (ref 0.61–1.24)
GFR, Estimated: 40 mL/min — ABNORMAL LOW (ref >60)
GFR, Estimated: 42 mL/min — ABNORMAL LOW (ref >60)
Glucose, Bld: 433 mg/dL — ABNORMAL HIGH (ref 70–99)
Glucose, Bld: 142 mg/dL — ABNORMAL HIGH (ref 70–99)
Potassium: 3.8 mmol/L (ref 3.5–5.1)
Potassium: 2.9 mmol/L — ABNORMAL LOW (ref 3.5–5.1)
Sodium: 143 mmol/L (ref 135–145)
Sodium: 148 mmol/L — ABNORMAL HIGH (ref 135–145)

## 2020-10-27 LAB — HEPARIN LEVEL (UNFRACTIONATED)
Heparin Unfractionated: 0.9 IU/mL — ABNORMAL HIGH (ref 0.30–0.70)
Heparin Unfractionated: 0.78 IU/mL — ABNORMAL HIGH (ref 0.30–0.70)
Heparin Unfractionated: 0.38 IU/mL (ref 0.30–0.70)
Heparin Unfractionated: 0.23 IU/mL — ABNORMAL LOW (ref 0.30–0.70)

## 2020-10-27 LAB — CBC
HCT: 26.3 % — ABNORMAL LOW (ref 39.0–52.0)
Hemoglobin: 8.5 g/dL — ABNORMAL LOW (ref 13.0–17.0)
MCH: 28.1 pg (ref 26.0–34.0)
MCHC: 32.3 g/dL (ref 30.0–36.0)
MCV: 86.8 fL (ref 80.0–100.0)
Platelets: 226 10*3/uL (ref 150–400)
RBC: 3.03 MIL/uL — ABNORMAL LOW (ref 4.22–5.81)
RDW: 17.9 % — ABNORMAL HIGH (ref 11.5–15.5)
WBC: 10.7 10*3/uL — ABNORMAL HIGH (ref 4.0–10.5)
nRBC: 0 % (ref 0.0–0.2)

## 2020-10-27 LAB — CBC WITH DIFFERENTIAL/PLATELET
Abs Immature Granulocytes: 0.23 10*3/uL — ABNORMAL HIGH (ref 0.00–0.07)
Basophils Absolute: 0 10*3/uL (ref 0.0–0.1)
Basophils Relative: 0 %
Eosinophils Absolute: 0.2 10*3/uL (ref 0.0–0.5)
Eosinophils Relative: 2 %
HCT: 26.7 % — ABNORMAL LOW (ref 39.0–52.0)
Hemoglobin: 8.7 g/dL — ABNORMAL LOW (ref 13.0–17.0)
Immature Granulocytes: 2 %
Lymphocytes Relative: 7 %
Lymphs Abs: 0.8 10*3/uL (ref 0.7–4.0)
MCH: 27.9 pg (ref 26.0–34.0)
MCHC: 32.6 g/dL (ref 30.0–36.0)
MCV: 85.6 fL (ref 80.0–100.0)
Monocytes Absolute: 0.5 10*3/uL (ref 0.1–1.0)
Monocytes Relative: 5 %
Neutro Abs: 9.4 10*3/uL — ABNORMAL HIGH (ref 1.7–7.7)
Neutrophils Relative %: 84 %
Platelets: 229 10*3/uL (ref 150–400)
RBC: 3.12 MIL/uL — ABNORMAL LOW (ref 4.22–5.81)
RDW: 18.1 % — ABNORMAL HIGH (ref 11.5–15.5)
WBC: 11.2 10*3/uL — ABNORMAL HIGH (ref 4.0–10.5)
nRBC: 0 % (ref 0.0–0.2)

## 2020-10-27 LAB — GLUCOSE, CAPILLARY
Glucose-Capillary: 125 mg/dL — ABNORMAL HIGH (ref 70–99)
Glucose-Capillary: 129 mg/dL — ABNORMAL HIGH (ref 70–99)
Glucose-Capillary: 140 mg/dL — ABNORMAL HIGH (ref 70–99)
Glucose-Capillary: 144 mg/dL — ABNORMAL HIGH (ref 70–99)
Glucose-Capillary: 151 mg/dL — ABNORMAL HIGH (ref 70–99)
Glucose-Capillary: 160 mg/dL — ABNORMAL HIGH (ref 70–99)

## 2020-10-27 LAB — PHOSPHORUS: Phosphorus: 3 mg/dL (ref 2.5–4.6)

## 2020-10-27 MED ORDER — POTASSIUM CHLORIDE 10 MEQ/50ML IV SOLN
10.0000 meq | INTRAVENOUS | Status: AC
  Administered 2020-10-27 (×6): 10 meq via INTRAVENOUS
  Filled 2020-10-27 (×6): qty 50

## 2020-10-27 MED ORDER — ORAL CARE MOUTH RINSE
15.0000 mL | Freq: Two times a day (BID) | OROMUCOSAL | Status: DC
  Administered 2020-10-27 – 2020-11-01 (×11): 15 mL via OROMUCOSAL

## 2020-10-27 MED ORDER — HEPARIN (PORCINE) 25000 UT/250ML-% IV SOLN
1700.0000 [IU]/h | INTRAVENOUS | Status: DC
  Administered 2020-10-27: 1400 [IU]/h via INTRAVENOUS
  Administered 2020-10-28: 1600 [IU]/h via INTRAVENOUS
  Administered 2020-10-29 (×2): 1700 [IU]/h via INTRAVENOUS
  Filled 2020-10-27 (×4): qty 250

## 2020-10-27 MED ORDER — TRAVASOL 10 % IV SOLN
INTRAVENOUS | Status: AC
  Filled 2020-10-27: qty 1220.4

## 2020-10-27 NOTE — Progress Notes (Signed)
Occupational Therapy Treatment Patient Details Name: Mark May MRN: [id number] DOB: 08/24/1956 Today's Date: 10/27/2020    History of present illness Pt is 64 year old male with hypertension and hyperlipidemia who presented with left-sided weakness, noted to have right MCA occlusion status post thrombectomy and stent placement 11/5.  Hospitalization complicated by hypoxic respiratory failure secondary to pulmonary edema requiring BiPAP and lasix. On 10/10/2020 he became more lethargic and tachycardic with tachypnea.  Patient went into A. fib with RVR ,also had a fever. Pt with sepsis and RLL PNA.  Placement of drain for R retroperitoneal abscess on 11/14, contained bowel perforation.  Pt with continued abdominal pain , he had NGtube added on 10/18/20.11/20 intubated with thoracentesis, VATS 11/22   OT comments  Pt seen for follow up session to check Kinesiotape on L shoulder. Skin is intact and without signs of discomfort. Pt reporting no issues with tape as well. Pt completed scapular retraction exercises x5 before fatiguing. Reinforced importance of these with family. Repositioned LUE on pillows and educated pt, family, and staff on appropriate positioning due to subluxed shoulder. D/c recs remain appropriate, will continue to follow.    Follow Up Recommendations  CIR;Supervision/Assistance - 24 hour    Equipment Recommendations  Wheelchair (measurements OT);Wheelchair cushion (measurements OT);Hospital bed;3 in 1 bedside commode    Recommendations for Other Services      Precautions / Restrictions Precautions Precautions: Fall Precaution Comments: L hemi; watch O2; R flank JP drain; L chest tube; TPN Restrictions Weight Bearing Restrictions: No Other Position/Activity Restrictions: L shoulder sublux with K tape       Mobility Bed Mobility                  Transfers                      Balance                                           ADL  either performed or assessed with clinical judgement   ADL Overall ADL's : Needs assistance/impaired                                       General ADL Comments: session focused on checking K tape allied yesterday for L shoulder facilitatory technique to reduce sublux. Pt asking for water     Vision   Vision Assessment?: Vision impaired- to be further tested in functional context Additional Comments: pt was able to loko left this date with multimodal cues   Perception     Praxis      Cognition Arousal/Alertness: Awake/alert Behavior During Therapy: Flat affect Overall Cognitive Status: Difficult to assess                                 General Comments: difficult to assess due to language barrier, daughter has been interpreting but daughter requires increased cues to interpret every aspect of session        Exercises Other Exercises Other Exercises: LUE: shoulder retraction x5; attempts with shoulder elevation with little success   Shoulder Instructions       General Comments      Pertinent Vitals/ Pain  Pain Assessment: No/denies pain  Home Living                                          Prior Functioning/Environment              Frequency  Min 2X/week        Progress Toward Goals  OT Goals(current goals can now be found in the care plan section)  Progress towards OT goals: Progressing toward goals  Acute Rehab OT Goals Patient Stated Goal: pt to return home OT Goal Formulation: With patient/family Time For Goal Achievement: 11/09/20 Potential to Achieve Goals: Good  Plan Discharge plan remains appropriate;Frequency remains appropriate    Co-evaluation                 AM-PAC OT "6 Clicks" Daily Activity     Outcome Measure   Help from another person eating meals?: Total (TPN) Help from another person taking care of personal grooming?: A Little Help from another person toileting,  which includes using toliet, bedpan, or urinal?: Total Help from another person bathing (including washing, rinsing, drying)?: Total Help from another person to put on and taking off regular upper body clothing?: Total Help from another person to put on and taking off regular lower body clothing?: Total 6 Click Score: 8    End of Session    OT Visit Diagnosis: Unsteadiness on feet (R26.81);Muscle weakness (generalized) (M62.81);Other symptoms and signs involving cognitive function;Hemiplegia and hemiparesis Hemiplegia - Right/Left: Left Hemiplegia - dominant/non-dominant: Non-Dominant Hemiplegia - caused by: Cerebral infarction   Activity Tolerance Patient tolerated treatment well   Patient Left in bed;with call bell/phone within reach;with nursing/sitter in room   Nurse Communication Mobility status        Time: 3646-8032 OT Time Calculation (min): 11 min  Charges: OT General Charges $OT Visit: 1 Visit OT Treatments $Therapeutic Exercise: 8-22 mins  Dalphine Handing, MSOT, OTR/L Acute Rehabilitation Services [facility] Office Number: [phone] Pager: [phone]  Dalphine Handing 10/27/2020, 9:26 AM

## 2020-10-27 NOTE — Progress Notes (Signed)
ANTICOAGULATION CONSULT NOTE  Pharmacy Consult for Heparin Indication: Afib, s/p CVA  No Known Allergies  Patient Measurements: Weight: 56.1 kg (123 lb 10.9 oz) Heparin Dosing Weight:  56.7 kg  Vital Signs: Temp: 97.9 F (36.6 C) (11/27 2013) Temp Source: Oral (11/27 2013) BP: 158/88 (11/27 2100) Pulse Rate: 84 (11/27 2100)  Labs: Recent Labs    10/26/20 0500 10/26/20 0529 10/27/20 0443 10/27/20 0620 10/27/20 0621 10/27/20 0751 10/27/20 1446 10/27/20 2100  HGB 8.8*   < > 8.5* 8.7*  --   --   --   --   HCT 26.8*  --  26.3* 26.7*  --   --   --   --   PLT 204  --  226 229  --   --   --   --   HEPARINUNFRC  --    < >  --   --    < > 0.78* 0.38 0.23*  CREATININE 2.49*  --  1.83* 1.78*  --   --   --   --    < > = values in this interval not displayed.    Estimated Creatinine Clearance: 32.8 mL/min (A) (by C-G formula based on SCr of 1.78 mg/dL (H)).  Assessment: 87 YOM who presented on 11/5 with R MCA stroke s/p IR + stent placement for reocclusion. Noted to have new onset Afib and pharmacy was consulted to dose heparin. Patient with concern for GIB and L hemothorax now s/p VATS procedure originally holding heparin now pharmacy has been consulted to resume. Aiming for lower goal of 0.3-0.5 d/t R MCA stroke and concern for GIB. CBC improved.  Heparin level is 0.23 < goal on heparin drip rate 1400 uts/hr      Goal of Therapy:  Heparin level 0.3 - 0.5 units/ml Monitor platelets by anticoagulation protocol: Yes    Plan:  Increase heparin drip to 1600 units/hr Daily HL and CBC   Leota Sauers Pharm.D. CPP, BCPS Clinical Pharmacist [phone] 10/27/2020 10:46 PM    Please see AMION for all pharmacy numbers 10/27/2020 10:44 PM

## 2020-10-27 NOTE — Progress Notes (Signed)
      [address]       [place] [address]             [phone]      5 Days Post-Op Procedure(s) (LRB): VIDEO ASSISTED THORACOSCOPY (VATS)/DECORTICATION (Left) Subjective:   Denies pain, son at bedside  Objective: Vital signs in last 24 hours: Temp:  [97.6 F (36.4 C)-99.3 F (37.4 C)] 98.5 F (36.9 C) (11/27 1143) Pulse Rate:  [68-89] 78 (11/27 1100) Cardiac Rhythm: Normal sinus rhythm (11/27 0800) Resp:  [14-28] 21 (11/27 1100) BP: (146-174)/(77-95) 160/95 (11/27 1100) SpO2:  [94 %-100 %] 99 % (11/27 1100)  Intake/Output from previous day: 11/26 0701 - 11/27 0700 In: 2836.5 [I.V.:2510.6; IV Piggyback:325.9] Out: 3335 [Urine:3060; Emesis/NG output:50; Drains:125; Chest Tube:100] Intake/Output this shift: Total I/O In: 527 [I.V.:435.1; IV Piggyback:91.9] Out: 690 [Urine:650; Drains:10; Chest Tube:30]  General appearance: alert, cooperative and no distress Heart: regular rate and rhythm Lungs: clear to auscultation bilaterally Wound: clean and dry  Lab Results: Recent Labs    10/27/20 0443 10/27/20 0620  WBC 10.7* 11.2*  HGB 8.5* 8.7*  HCT 26.3* 26.7*  PLT 226 229   BMET:  Recent Labs    10/27/20 0443 10/27/20 0620  NA 143 148*  K 3.8 2.9*  CL 104 109  CO2 26 28  GLUCOSE 433* 142*  BUN 66* 66*  CREATININE 1.83* 1.78*  CALCIUM 7.7* 7.9*    PT/INR: No results for input(s): LABPROT, INR in the last 72 hours. ABG    Component Value Date/Time   PHART 7.302 (L) 10/20/2020 0359   HCO3 21.1 10/20/2020 0359   TCO2 22 10/20/2020 0359   ACIDBASEDEF 5.0 (H) 10/20/2020 0359   O2SAT 94.0 10/20/2020 0359   CBG (last 3)  Recent Labs    10/27/20 0317 10/27/20 0813 10/27/20 1148  GLUCAP 160* 125* 151*    Assessment/Plan: S/P Procedure(s) (LRB): VIDEO ASSISTED THORACOSCOPY (VATS)/DECORTICATION (Left)  1. Chest tube- 100 cc recorded yesterday, level currently at 1750 in pleurovac, leave in place for now   LOS: 22 days     Lowella Dandy, PA-C 10/27/2020

## 2020-10-27 NOTE — Progress Notes (Signed)
ANTICOAGULATION CONSULT NOTE  Pharmacy Consult for Heparin Indication: Afib, s/p CVA  No Known Allergies  Patient Measurements: Weight: 56.1 kg (123 lb 10.9 oz) Heparin Dosing Weight:  56.7 kg  Vital Signs: Temp: 99.4 F (37.4 C) (11/27 1603) Temp Source: Axillary (11/27 1603) BP: 162/88 (11/27 1600) Pulse Rate: 79 (11/27 1600)  Labs: Recent Labs    10/26/20 0500 10/26/20 0529 10/26/20 2000 10/27/20 0443 10/27/20 0620 10/27/20 0621 10/27/20 0751 10/27/20 1446  HGB 8.8*   < >  --  8.5* 8.7*  --   --   --   HCT 26.8*  --   --  26.3* 26.7*  --   --   --   PLT 204  --   --  226 229  --   --   --   HEPARINUNFRC  --    < >   < >  --   --  0.90* 0.78* 0.38  CREATININE 2.49*  --   --  1.83* 1.78*  --   --   --    < > = values in this interval not displayed.    Estimated Creatinine Clearance: 32.8 mL/min (A) (by C-G formula based on SCr of 1.78 mg/dL (H)).  Assessment: 60 YOM who presented on 11/5 with R MCA stroke s/p IR + stent placement for reocclusion. Noted to have new onset Afib and pharmacy was consulted to dose heparin. Patient with concern for GIB and L hemothorax now s/p VATS procedure originally holding heparin now pharmacy has been consulted to resume. Aiming for lower goal of 0.3-0.5 d/t R MCA stroke and concern for GIB. CBC improved.  Heparin level is now therapeutic.    Goal of Therapy:  Heparin level 0.3 - 0.5 units/ml Monitor platelets by anticoagulation protocol: Yes    Plan:  Continue heparin gtt at 1400 units/hr Check a 6 hr HL to confirm  Daily HL and CBC  Lysle Pearl, PharmD, BCPS Clinical Pharmacist Please see AMION for all pharmacy numbers 10/27/2020 5:11 PM

## 2020-10-27 NOTE — Progress Notes (Addendum)
ANTICOAGULATION CONSULT NOTE  Pharmacy Consult for Heparin Indication: Afib, s/p CVA  No Known Allergies  Patient Measurements: Weight: 56.1 kg (123 lb 10.9 oz) Heparin Dosing Weight:  56.7 kg  Vital Signs: Temp: 99.3 F (37.4 C) (11/27 0329) Temp Source: Axillary (11/27 0329) BP: 166/91 (11/27 0600) Pulse Rate: 79 (11/27 0600)  Labs: Recent Labs    10/25/20 0741 10/25/20 1356 10/26/20 0500 10/26/20 0500 10/26/20 0529 10/26/20 1137 10/26/20 2000 10/27/20 0443 10/27/20 0620 10/27/20 0621  HGB  --   --  8.8*   < >  --   --   --  8.5* 8.7*  --   HCT  --   --  26.8*  --   --   --   --  26.3* 26.7*  --   PLT  --   --  204  --   --   --   --  226 229  --   HEPARINUNFRC  --    < >  --   --    < > <0.10* <0.10*  --   --  0.90*  CREATININE 2.67*  --  2.49*  --   --   --   --  1.83*  --   --    < > = values in this interval not displayed.    Estimated Creatinine Clearance: 31.9 mL/min (A) (by C-G formula based on SCr of 1.83 mg/dL (H)).  Assessment: 51 YOM who presented on 11/5 with R MCA stroke s/p IR + stent placement for reocclusion. Noted to have new onset Afib and pharmacy was consulted to dose heparin. Patient with concern for GIB and L hemothorax now s/p VATS procedure originally holding heparin now pharmacy has been consulted to resume. Aiming for lower goal of 0.3-0.5 d/t R MCA stroke and concern for GIB. CBC improved.  Heparin level has been undetectable since it was restarted on 10/24/20 (AT3 within normal limits), but now is suddenly supra-therapeutic on 1800 units/hr.  He was on heparin 2 weeks ago and was therapeutic on 800 units/hr.  RN notes line was changed to different site ~7 hrs prior to lab draw. No bleeding noted and CBC is stable. ?Prior line functioning however no signs of this when evaluated by RN, PharmD, and IV team.  Goal of Therapy:  Heparin level 0.3 - 0.5 units/ml Monitor platelets by anticoagulation protocol: Yes    Plan:  Stat repeat level -  just to ensure.  Hold Heparin for 1 hour due to high level.  Reduce IV Heparin rate to 1400 units/hr after holding for 1 hour. Check 6-8 hr heparin level Monitor closely for s/sx of bleeding  Link Snuffer, PharmD, BCPS, BCCCP Clinical Pharmacist Please refer to [facility] for [facility] Pharmacy numbers 10/27/2020, 7:23 AM

## 2020-10-27 NOTE — Progress Notes (Signed)
PHARMACY - TOTAL PARENTERAL NUTRITION CONSULT NOTE  Indication: Perforated duodenum  Patient Measurements: Weight: 56.1 kg (123 lb 10.9 oz)   Body mass index is 19.37 kg/m. Usual Weight: 120-125 lbs  Assessment:  64 YOM presented on 10/09/2020 with right MCA occlusion and underwent revascularization. PMH significant for PUD post partial gastrectomy in 1990. Patient was started on a dysphagia diet on 10/08/20 and also transferred out of the ICU. Post-op course complicated by Afib RVR, aspiration PNA/cellulitis and AKI. He was then started on TF on 10/12/20, which ended on 10/13/20 when CT showed perinephric abscess with gas formation. Drain was placed and there is concern for fistulous communication to the colon on 10/16/20 CT. Nutrition has been inadequate since admission, and Pharmacy consulted for TPN management. Per patient's son, patient has no recent weight loss and was eating a balanced diet PTA.  11/25 - Patient off vasopressors and on minimal sedation. May consider extubation today per notes.  Glucose / Insulin: no hx DM - CBGs now controlled. Utilized 27 units aspart + 25 units regular insulin in bag yesterday. Insulin drip d/c'd 11/24 PM, Levemir d/c'd 11/25 Electrolytes: K down to 2.9 - giving 6 runs and adjusting K in TPN, no lasix planned, Na high 148, CO2 up to 28 (2:1 acetate in TPN), Phos 3, CoCa ~10.2; others stable WNL Renal: SCr trend down to 1.78 LFTs / TGs:  AST down to 103 / ALT up to 184, Tbili up to 2.1, TG normalized on 11/22 Prealbumin / albumin: albumin 1.1, prealbumin 9.4>8.2 Intake / Output; MIVF: UOP 2.3 ml/kg/hr, drain output , chest tube output 100 mL, NGT output 50 mL - now removed; net +0.2L this admit (s/p lasix 11/25) GI Imaging: 11/20 CT - large IA fluid and blood in LUQ worrisome for active bleeding, renal cysts, cannot exclude fistulous communication with large bowel  Pending GI study vs CT to r/o further leak before initiating TF  Surgeries /  Procedures: 11/20 - intubated, thoracentesis -- drained cloudy, yellow appearing fluid 11/22 - VATS/decortication  Central access: PICC 10/17/20 TPN start date: 10/17/20  Nutritional Goals (per RD rec on 11/23): 1950 - 2240 kCal, 115 - 130g protein; >1.8L fluid per day  Goal TPN rate is 90 ml/hr - will provide 122g protein, 292g CHO, and 67g SMOF lipids, for total 2150 kCal  Current Nutrition:  TPN; NPO  Plan:  Continue TPN at goal rate 90 mL/hr at 1800 Electrolytes in TPN: continue Phos 5 mmol/L; decr Na 38mEq/L, incr K 50 mEq/L, Ca 47mEq/L, Mag 48mEq/L, change to 1:1 acetate Continue standard MVI and trace elements to TPN Continue ICU resistant scale SSI + cont 25 units regular insulin to TPN bag and adjust as needed - none today, watch CBGs closely Monitor Surgery plans, TPN labs, I/O's  Thank you for involving pharmacy in this patient's care.  Loura Back, PharmD, BCPS Clinical Pharmacist Clinical phone for 10/27/2020 until 3p is [phone] 10/27/2020 7:09 AM  **Pharmacist phone directory can be found on [email or web address] listed under [facility]**

## 2020-10-27 NOTE — Progress Notes (Signed)
NAME:  Mark May, MRN:  [id number], DOB:  Feb 24, 1956, LOS: 22 ADMISSION DATE:  10/17/2020, CONSULTATION DATE: 10/28/2020 REFERRING MD: Dr. Curtis Sites, CHIEF COMPLAINT: Left-sided weakness  Brief History   64 year old male with HTN and HLD who presented with left-sided weakness, noted to have right MCA occlusion status post thrombectomy and stent placement 11/5.  Hospitalization complicated by hypoxic respiratory failure secondary to pulmonary edema requiring BiPAP and lasix.  Awaiting CIR placement however, on the evening of 11/10, he was noted to become more lethargic with tachycardia and tachypnea.  Had complained of abdominal pain with some distention and chest pain which resolved after burping.  KUB obtained which was normal.  On 11/11, he was somewhat more responsive but now with increasing sCr in which lisinopril was stopped and development of fever 102.6  TRH initially consulted for help with medical management however on their evaluation, PCCM consulted given ill appearance and developing hypotension and new onset Afib with RVR found to have sepsis with RLL pneumonia likely due to aspiration started on zosyn. PCCM consulted for continue right abdominal pain and distension with concern for bowel perforation secondary to Cortrak.  On 11/13, CT abd/ pelvis showed large right perinephric fluid collection and pockets of air concerning for an infectious process/abscess. Underwent CT guided drain placement of right retroperitoneal abscess.  On 11/15, he developed melena with anemia.  GI consulted with plans to medically management and deferred EGD.  Surgery was consulted on 11/17 with imaging concerning for possible contained bowel perforation.  On 11/20, he developed respiratory distress requiring intubation. Underwent thoracentesis on 11/20.  Hypotensive with Hgb drob on 11/21, CT ABD/Pelvis w/ concern for active RP bleed.  Subsequently, developed hemothorax requiring OR for VATS on 11/22.    Past  Medical History  Hypertension Peptic ulcer disease, previous partial gastrectomy (in [place]/ 1990) Gout BPH Sciatica   Significant Hospital Events   11/05 Mechanical thrombectomy of right MCA and stent 11/5 11/11 PCCM reconsulted  11/12 Cortrak placed 11/14 CT guided drainage for R RP abscess with JP 11/17 PCCM reconsulted 11/18 CCM recalled by rapid response, fever, resp distress, CXR new left infiltrates with left pleural effusion 11/20 Thoracentesis  11/21 Hypotensive with Hgb drop to 6.1 s/p 2 units PRBC, CT ABD with concerning for active bleeding 11/22 To OR for VATS, decortication   Consults:  IR Neurology PCCM 11/5- 11/8; 11/11; 11/16-11/17, 11/18 -  TRH Urology 11/13 GI 11/15 CCS 11/17  Procedures:  R Femoral Sheath 11/6 >> 11/6 RUE PICC 11/17 >>  ETT 11/20 >>   Significant Diagnostic Tests:  11/5 CT head >> No acute abnormality.  11/5 CTA head / neck >> Severe distal right M1 stenosis or subocclusive thrombus, Right MCA infarct with extensive penumbra, 4 mm right supraclinoid ICA aneurysm. Widely patent cervical carotid and vertebral arteries. 11/5 Postprocedure CT head >> Hypodensity in the right lateral temporal lobe now shows mild hemorrhage or contrast enhancement. This is most consistent with acute infarct. There has been interval stenting of the right middle cerebral artery. 11/6 MRI brain >> Fairly extensive patchy acute/early subacute infarcts within the right MCA/watershed territory.  Additional small acute/early subacute infarcts within the dorsal right thalamus and left basal ganglia. 11/6  MRA head >>  Interval stenting of the M1/M2 right middle cerebral artery. Flow related signal is present proximal and distal to the stent suggestive of stent patency. 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 11/6 TTE >> LVEF 70-75%, no RWMA,  Grade II diastolic dysfunction, elevated LA pressure 11/13 CT ABD/Pelvis >> large right  perinephric fluid collection and pockets of air concerning for an infectious process/abscess. Cholelithiasis. Nonobstructing right renal calculi. No hydronephrosis. Colonic diverticulosis. No bowel obstruction. Partially visualized small bilateral pleural effusions with complete consolidative changes of the visualized lower lobes. 11/16 CT ABD/Pelvis >> interval placement of drainage catheter into the right perinephric space in the area of previously seen gas and fluid collection. Fluid collection has decreased since prior study. There is now contrast material seen within the perinephric space and extending into the right paracolic gutter. This is concerning for possible fistulous communication to the colon. May consider contrast injection through the drainage catheter under fluoroscopic guidance to assess for enteric fistula. Moderate bilateral pleural effusions with bibasilar atelectasis or consolidation, unchanged.  Cholelithiasis.  NG tube in the stomach.  Aortic atherosclerosis.  Small to moderate free fluid in the pelvis. 11/20 L Thoracentesis 650 ml cloudy yellow fluid 11/21 CT abd/ pelvis >> very large area of intra-abdominal fluid and blood within the left upper quadrant, with an appearance worrisome for active bleeding.  Stable position of the right-sided percutaneous drainage catheter with a moderate amount of surrounding contained free air, fluid and inflammatory fat stranding. Fistulous communication with the adjacent portion of large bowel cannot be excluded. Moderate to marked amount of para muscular subcutaneous inflammatory fat stranding along the lateral aspects of the left abdominal and pelvic walls.   Micro Data:  MRSA PCR 11/5 >> negative COVID 11/5 >> negative Flu 11/5 >> negative UC 11/10 >> neg BCx2 11/11 >> neg BC x2 11/13 >> neg Right RRP abscess 11/14 >> moderate candida, klebsiella >>  BCx 2 11/19 >> Left pleural fluid 11/20 >> Klebsiella >>   Antimicrobials:  Cefazolin  11/5 >> 11/6 Flagyl 11/11 x1 Cefepime 11/11, 11/18 >> 11/19  Vanco 11/12 >> 11/16, 11/18  Unasyn 11/17 >> 11/18  Flagyl 11/18 >> 11/19  Diflucan 11/17 >>  Zosyn 11/11 >> 11/17, 11/19 >>   Interim history/subjective:   Remains critically ill. Resting in bed. On TPN. Per daughter at bedside states that he didn't sleep well.   Objective   Blood pressure (!) 166/91, pulse 79, temperature 99.3 F (37.4 C), temperature source Axillary, resp. rate (!) 23, weight 56.1 kg, SpO2 100 %.    Vent Mode: PSV;CPAP FiO2 (%):  [30 %] 30 % PEEP:  [5 cmH20] 5 cmH20 Pressure Support:  [8 cmH20] 8 cmH20   Intake/Output Summary (Last 24 hours) at 10/27/2020 0705 Last data filed at 10/27/2020 0600 Gross per 24 hour  Intake 2741.54 ml  Output 3335 ml  Net -593.46 ml   Filed Weights   10/27/2020 0100 10/25/20 0340 10/26/20 0500  Weight: 59.3 kg 59.3 kg 56.1 kg    Physical Exam General: thin, eldlerly cachetic male HEENT: ETT in place  Neuro: alert, following commands CV: RRR, s1 s2  PULM: dimished BL breath sounds GI: soft, nt nd  Extremities: no edema  Skin: no rash   Resolved Hospital Problems:  Hypernatremia  Assessment & Plan:   Complex clinical course: admitted with R MCA CVA.   Acute respiratory failure secondary to aspiration pneumonia with bilateral pleural effusion, left klebsiella empyema  Hemothorax following thoracentesis  S/p thora on 11/20 with exudative properties, klebsiella in pleural culture & RP abscess.  s/post thora extravasation on CTA chest  Plan: Weaning FiO2 as tolerated to maintain sats >90%   Mixed Shock - Septic + ABLA / Hypovolemic Multifactorial, ABLA  and septic. S/p 2 units blood on 11/21.  Klebsiella Empyema + fluid collection in abd.  Sepsis secondary to RLL PNA, Intra-abdominal Abscess Suspected aspiration and intraabdominal abscess (klebsiella and candida) in setting of bowel perforation and now with left empyema . 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 Superior-most edge lines up with distal end of cortrak. - continue post-op care per [facility]  - possible continued leak eval early next week.   AKI  Plan: Follow UOP Avoid nephrotoxic agents   PAF with RVR - tele monitoring   Acute R MCA CVA s/p thrombectomy and right MCA stent placement by IR with TICI 3 Left hemiplegia Dysphagia Plan: Continue asa   Severe Protein Calorie Malnutrition Continue TPN    Best practice:  Diet: bowel rest/ NPO. Continue TPN Pain/Anxiety/Delirium protocol (if indicated):  precedex VAP protocol (if indicated): Oral care protocol DVT prophylaxis: SCDs for now GI prophylaxis: PPI  Glucose control: SSI Mobility: bed rest Code Status: full code Family Communication: daughter updated  Disposition:  ICU  LABS    PULMONARY No results for input(s): PHART, PCO2ART, PO2ART, HCO3, TCO2, O2SAT in the last 168 hours.  Invalid input(s): PCO2, PO2 CBC Recent Labs  Lab 10/26/20 0500 10/27/20 0443 10/27/20 0620  HGB 8.8* 8.5* 8.7*  HCT 26.8* 26.3* 26.7*  WBC 12.5* 10.7* 11.2*  PLT 204 226 229   COAGULATION Recent Labs  Lab 10/21/20 0139 10/23/2020 0446  INR 1.7* 1.3*   CARDIAC  No results for input(s): TROPONINI in the last 168 hours. No results for input(s): PROBNP in the last 168 hours.  CHEMISTRY Recent Labs  Lab 10/20/2020 0446 10/30/2020 0446 10/23/20 0536 10/23/20 0536 10/24/20 0750 10/24/20 0750 10/25/20 0213 10/25/20 0213 10/25/20 0741 10/25/20 0741 10/26/20 0500 10/27/20 0443  NA 144   < > 139   < > 142  --  139  --  142  --  144 143  K 4.7   < > 5.3*   < > 3.6   < > 4.1   < > 3.5   < > 2.8* 3.8  CL 120*   < > 115*   < > 110  --  105  --  109  --  106 104  CO2 16*   < > 16*   < > 19*  --  21*  --  21*  --  24 26  GLUCOSE 116*   < > 225*   < > 224*  --  563*  --  130*  --  165* 433*  BUN  61*   < > 64*   < > 73*  --  72*  --  77*  --  80* 66*  CREATININE 2.74*   < > 2.96*   < > 2.88*  --  2.56*  --  2.67*  --  2.49* 1.83*  CALCIUM 6.8*   < > 7.0*   < > 7.1*  --  7.6*  --  7.5*  --  7.8* 7.7*  MG 2.0  --   --   --   --   --  2.3  --  2.1  --  2.0  --   PHOS 4.9*   < > 6.3*  --   --   --  2.2*  --  2.4*  --  2.9 3.0   < > = values in this interval not displayed.  Estimated Creatinine Clearance: 31.9 mL/min (A) (by C-G formula based on SCr of 1.83 mg/dL (H)).  LIVER Recent Labs  Lab 10/21/20 0139 10/21/20 0627 10/21/20 1737 10/30/2020 0446 10/25/20 0213 10/25/20 0741 10/26/20 0500  AST  --   --  144* 170* 126* 103* 67*  ALT  --   --  80* 99* 201* 184* 128*  ALKPHOS  --   --  70 67 74 83 90  BILITOT  --   --  1.6* 1.7* 1.7* 2.1* 2.1*  PROT  --    < > 5.0* 4.4* 4.6* 5.1* 5.3*  ALBUMIN  --   --  1.5* 1.3* 1.1* 1.1* 1.1*  INR 1.7*  --   --  1.3*  --   --   --    < > = values in this interval not displayed.   INFECTIOUS No results for input(s): LATICACIDVEN, PROCALCITON in the last 168 hours. ENDOCRINE CBG (last 3)  Recent Labs    10/26/20 1938 10/26/20 2350 10/27/20 0317  GLUCAP 150* 142* 160*     Josephine Igo, DO [facility] 10/27/2020 7:05 AM

## 2020-10-27 NOTE — Progress Notes (Signed)
Progress Note  5 Days Post-Op  Subjective: Extubated yesterday, patient removed NG  Objective: Vital signs in last 24 hours: Temp:  [97.6 F (36.4 C)-99.3 F (37.4 C)] 97.7 F (36.5 C) (11/27 0808) Pulse Rate:  [68-89] 79 (11/27 0600) Resp:  [14-29] 23 (11/27 0600) BP: (150-169)/(71-91) 166/91 (11/27 0600) SpO2:  [94 %-100 %] 100 % (11/27 0600) Last BM Date: 10/20/20  Intake/Output from previous day: 11/26 0701 - 11/27 0700 In: 2741.5 [I.V.:2415.6; IV Piggyback:325.9] Out: 3335 [Urine:3060; Emesis/NG output:50; Drains:125; Chest Tube:100] Intake/Output this shift: No intake/output data recorded.  PE: General: cachectic male, sleeping, difficult to awaken Heart: regular, rate, and rhythm. Palpable radial and pedal pulses bilaterally Lungs: unlabored resps,  L chest tube in place with SS fluid Abd: soft, mildly distended, nontender, drain present with thick greenish-brown fluid   Lab Results:  Recent Labs    10/27/20 0443 10/27/20 0620  WBC 10.7* 11.2*  HGB 8.5* 8.7*  HCT 26.3* 26.7*  PLT 226 229   BMET Recent Labs    10/27/20 0443 10/27/20 0620  NA 143 148*  K 3.8 2.9*  CL 104 109  CO2 26 28  GLUCOSE 433* 142*  BUN 66* 66*  CREATININE 1.83* 1.78*  CALCIUM 7.7* 7.9*   PT/INR No results for input(s): LABPROT, INR in the last 72 hours. CMP     Component Value Date/Time   NA 148 (H) 10/27/2020 0620   NA 140 03/09/2018 1620   NA 136 01/06/2015 1448   K 2.9 (L) 10/27/2020 0620   K 3.6 01/06/2015 1448   CL 109 10/27/2020 0620   CL 99 01/06/2015 1448   CO2 28 10/27/2020 0620   CO2 28 01/06/2015 1448   GLUCOSE 142 (H) 10/27/2020 0620   GLUCOSE 154 (H) 01/06/2015 1448   BUN 66 (H) 10/27/2020 0620   BUN 12 03/09/2018 1620   BUN 12 01/06/2015 1448   CREATININE 1.78 (H) 10/27/2020 0620   CREATININE 0.88 01/06/2015 1448   CALCIUM 7.9 (L) 10/27/2020 0620   CALCIUM 8.7 01/06/2015 1448   PROT 5.3 (L) 10/26/2020 0500   PROT 7.4 03/09/2018 1620   PROT  8.0 01/06/2015 1448   ALBUMIN 1.1 (L) 10/26/2020 0500   ALBUMIN 4.8 03/09/2018 1620   ALBUMIN 4.2 01/06/2015 1448   AST 67 (H) 10/26/2020 0500   AST 32 01/06/2015 1448   ALT 128 (H) 10/26/2020 0500   ALT 44 01/06/2015 1448   ALKPHOS 90 10/26/2020 0500   ALKPHOS 123 (H) 01/06/2015 1448   BILITOT 2.1 (H) 10/26/2020 0500   BILITOT 0.6 03/09/2018 1620   BILITOT 0.3 01/06/2015 1448   GFRNONAA 42 (L) 10/27/2020 0620   GFRNONAA >60 01/06/2015 1448   GFRAA 113 03/09/2018 1620   GFRAA >60 01/06/2015 1448   Lipase  No results found for: LIPASE     Studies/Results: DG CHEST PORT 1 VIEW  Result Date: 10/27/2020 CLINICAL DATA:  Follow-up left pneumothorax EXAM: PORTABLE CHEST 1 VIEW COMPARISON:  10/24/2020 FINDINGS: Interval removal of the enteric and endotracheal tubes. Right PICC line and left chest tube are unchanged in position. Persistent left lateral pleural effusion or thickening with basilar atelectasis or infiltration. No visible residual pneumothorax. Small right pleural effusion. IMPRESSION: Interval removal of endotracheal and enteric tubes. Otherwise no significant change. No visible residual pneumothorax. Electronically Signed   By: Burman Nieves M.D.   On: 10/27/2020 05:12    Anti-infectives: Anti-infectives (From admission, onward)   Start     Dose/Rate Route Frequency Ordered  Stop   2020/11/18 1400  fluconazole (DIFLUCAN) IVPB 200 mg        200 mg 100 mL/hr over 60 Minutes Intravenous Every 24 hours Nov 18, 2020 1148     10/19/20 1400  piperacillin-tazobactam (ZOSYN) IVPB 3.375 g        3.375 g 12.5 mL/hr over 240 Minutes Intravenous Every 8 hours 10/19/20 1150     10/19/20 0200  vancomycin (VANCOREADY) IVPB 500 mg/100 mL  Status:  Discontinued        500 mg 100 mL/hr over 60 Minutes Intravenous Every 12 hours 10/19/20 0025 10/19/20 0951   10/19/20 0200  metroNIDAZOLE (FLAGYL) IVPB 500 mg  Status:  Discontinued        500 mg 100 mL/hr over 60 Minutes Intravenous Every 8  hours 10/19/20 0045 10/19/20 1150   10/19/20 0115  ceFEPIme (MAXIPIME) 2 g in sodium chloride 0.9 % 100 mL IVPB  Status:  Discontinued        2 g 200 mL/hr over 30 Minutes Intravenous Every 12 hours 10/19/20 0025 10/19/20 1150   10/17/20 1400  Ampicillin-Sulbactam (UNASYN) 3 g in sodium chloride 0.9 % 100 mL IVPB  Status:  Discontinued        3 g 200 mL/hr over 30 Minutes Intravenous Every 6 hours 10/17/20 1207 10/18/20 2325   10/17/20 1400  fluconazole (DIFLUCAN) IVPB 400 mg  Status:  Discontinued        400 mg 100 mL/hr over 120 Minutes Intravenous Every 24 hours 10/17/20 1207 11-18-2020 1148   10/15/20 1630  vancomycin (VANCOREADY) IVPB 500 mg/100 mL  Status:  Discontinued        500 mg 100 mL/hr over 60 Minutes Intravenous Every 12 hours 10/15/20 0940 10/17/20 1205   10/14/20 0400  vancomycin (VANCOREADY) IVPB 500 mg/100 mL  Status:  Discontinued        500 mg 100 mL/hr over 60 Minutes Intravenous Every 24 hours 10/13/20 0233 10/15/20 0940   10/13/20 0330  vancomycin (VANCOREADY) IVPB 750 mg/150 mL        750 mg 150 mL/hr over 60 Minutes Intravenous  Once 10/13/20 0233 10/13/20 0439   10/12/20 1800  ceFEPIme (MAXIPIME) 2 g in sodium chloride 0.9 % 100 mL IVPB  Status:  Discontinued        2 g 200 mL/hr over 30 Minutes Intravenous Every 24 hours 10/11/20 1718 10/11/20 1742   10/12/20 1800  vancomycin (VANCOREADY) IVPB 750 mg/150 mL  Status:  Discontinued        750 mg 150 mL/hr over 60 Minutes Intravenous Every 24 hours 10/11/20 1724 10/11/20 1758   10/12/20 0200  metroNIDAZOLE (FLAGYL) tablet 500 mg  Status:  Discontinued        500 mg Oral Every 8 hours 10/11/20 1715 10/11/20 1758   10/12/20 0030  piperacillin-tazobactam (ZOSYN) IVPB 3.375 g  Status:  Discontinued        3.375 g 12.5 mL/hr over 240 Minutes Intravenous Every 8 hours 10/11/20 1805 10/17/20 1205   10/11/20 1830  piperacillin-tazobactam (ZOSYN) IVPB 3.375 g        3.375 g 100 mL/hr over 30 Minutes Intravenous  Once  10/11/20 1805 10/11/20 1839   10/11/20 1715  ceFEPIme (MAXIPIME) 2 g in sodium chloride 0.9 % 100 mL IVPB  Status:  Discontinued        2 g 200 mL/hr over 30 Minutes Intravenous STAT 10/11/20 1710 10/11/20 1742   10/11/20 1715  metroNIDAZOLE (FLAGYL) IVPB 500 mg  Status:  Discontinued        500 mg 100 mL/hr over 60 Minutes Intravenous STAT 10/11/20 1710 10/11/20 1807   10/11/20 1715  vancomycin (VANCOCIN) IVPB 1000 mg/200 mL premix  Status:  Discontinued        1,000 mg 200 mL/hr over 60 Minutes Intravenous STAT 10/11/20 1710 10/11/20 1804   10/14/2020 1541  ceFAZolin (ANCEF) 2-4 GM/100ML-% IVPB       Note to Pharmacy: [facility]   : cabinet override      10/07/2020 1541 10/06/20 0344       Assessment/Plan RLL PNA Metabolic encephalopathy  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 CT with PO contrast at some point next week to assess for origin and healing. At that time if patient appears to have leak will need to consider continuing TPN and NPO vs palliative consult vs surgical intervention understanding that patient is a high risk surgical candidate given recent stroke. Will see again Monday, please call if issues in the meantime  New L hemothorax - S/p VATS 11/22 Dr. Cliffton Asters, per TCTS  FEN: NPO, IVF, PICC and TPN  VTE: SCDs, ASA suppository ID: current abx are zosyn and flagyl  LOS: 22  days    Berna Bue ,MD [facility] 10/27/2020, 8:32 AM Please see Amion to contact general surgery on-call if needed.

## 2020-10-28 DIAGNOSIS — I6601 Occlusion and stenosis of right middle cerebral artery: Secondary | ICD-10-CM | POA: Diagnosis not present

## 2020-10-28 LAB — CBC WITH DIFFERENTIAL/PLATELET
Abs Immature Granulocytes: 0.19 10*3/uL — ABNORMAL HIGH (ref 0.00–0.07)
Basophils Absolute: 0 10*3/uL (ref 0.0–0.1)
Basophils Relative: 0 %
Eosinophils Absolute: 0.2 10*3/uL (ref 0.0–0.5)
Eosinophils Relative: 2 %
HCT: 24.7 % — ABNORMAL LOW (ref 39.0–52.0)
Hemoglobin: 8 g/dL — ABNORMAL LOW (ref 13.0–17.0)
Immature Granulocytes: 2 %
Lymphocytes Relative: 10 %
Lymphs Abs: 1 10*3/uL (ref 0.7–4.0)
MCH: 28.1 pg (ref 26.0–34.0)
MCHC: 32.4 g/dL (ref 30.0–36.0)
MCV: 86.7 fL (ref 80.0–100.0)
Monocytes Absolute: 0.6 10*3/uL (ref 0.1–1.0)
Monocytes Relative: 6 %
Neutro Abs: 7.6 10*3/uL (ref 1.7–7.7)
Neutrophils Relative %: 80 %
Platelets: 268 10*3/uL (ref 150–400)
RBC: 2.85 MIL/uL — ABNORMAL LOW (ref 4.22–5.81)
RDW: 19.1 % — ABNORMAL HIGH (ref 11.5–15.5)
WBC: 9.6 10*3/uL (ref 4.0–10.5)
nRBC: 0 % (ref 0.0–0.2)

## 2020-10-28 LAB — CBC
HCT: 24.6 % — ABNORMAL LOW (ref 39.0–52.0)
Hemoglobin: 8.1 g/dL — ABNORMAL LOW (ref 13.0–17.0)
MCH: 30.2 pg (ref 26.0–34.0)
MCHC: 32.9 g/dL (ref 30.0–36.0)
MCV: 91.8 fL (ref 80.0–100.0)
Platelets: 230 10*3/uL (ref 150–400)
RBC: 2.68 MIL/uL — ABNORMAL LOW (ref 4.22–5.81)
RDW: 19.9 % — ABNORMAL HIGH (ref 11.5–15.5)
WBC: 10.2 10*3/uL (ref 4.0–10.5)
nRBC: 0 % (ref 0.0–0.2)

## 2020-10-28 LAB — BASIC METABOLIC PANEL
Anion gap: 11 (ref 5–15)
BUN: 62 mg/dL — ABNORMAL HIGH (ref 8–23)
CO2: 26 mmol/L (ref 22–32)
Calcium: 8 mg/dL — ABNORMAL LOW (ref 8.9–10.3)
Chloride: 112 mmol/L — ABNORMAL HIGH (ref 98–111)
Creatinine, Ser: 1.69 mg/dL — ABNORMAL HIGH (ref 0.61–1.24)
GFR, Estimated: 44 mL/min — ABNORMAL LOW (ref >60)
Glucose, Bld: 123 mg/dL — ABNORMAL HIGH (ref 70–99)
Potassium: 3.8 mmol/L (ref 3.5–5.1)
Sodium: 149 mmol/L — ABNORMAL HIGH (ref 135–145)

## 2020-10-28 LAB — MAGNESIUM: Magnesium: 1.9 mg/dL (ref 1.7–2.4)

## 2020-10-28 LAB — GLUCOSE, CAPILLARY
Glucose-Capillary: 112 mg/dL — ABNORMAL HIGH (ref 70–99)
Glucose-Capillary: 119 mg/dL — ABNORMAL HIGH (ref 70–99)
Glucose-Capillary: 127 mg/dL — ABNORMAL HIGH (ref 70–99)
Glucose-Capillary: 139 mg/dL — ABNORMAL HIGH (ref 70–99)
Glucose-Capillary: 140 mg/dL — ABNORMAL HIGH (ref 70–99)
Glucose-Capillary: 172 mg/dL — ABNORMAL HIGH (ref 70–99)

## 2020-10-28 LAB — HEPARIN LEVEL (UNFRACTIONATED)
Heparin Unfractionated: 0.35 IU/mL (ref 0.30–0.70)
Heparin Unfractionated: 0.22 IU/mL — ABNORMAL LOW (ref 0.30–0.70)

## 2020-10-28 MED ORDER — POTASSIUM CHLORIDE 10 MEQ/50ML IV SOLN
10.0000 meq | INTRAVENOUS | Status: AC
  Administered 2020-10-28 (×2): 10 meq via INTRAVENOUS
  Filled 2020-10-28 (×2): qty 50

## 2020-10-28 MED ORDER — TRAVASOL 10 % IV SOLN
INTRAVENOUS | Status: AC
  Filled 2020-10-28: qty 1220.4

## 2020-10-28 MED ORDER — MAGNESIUM SULFATE IN D5W 1-5 GM/100ML-% IV SOLN
1.0000 g | Freq: Once | INTRAVENOUS | Status: AC
  Administered 2020-10-28: 1 g via INTRAVENOUS
  Filled 2020-10-28: qty 100

## 2020-10-28 NOTE — Plan of Care (Signed)
  Problem: Education: Goal: Knowledge of General Education information will improve Description: Including pain rating scale, medication(s)/side effects and non-pharmacologic comfort measures Outcome: Progressing   Problem: Health Behavior/Discharge Planning: Goal: Ability to manage health-related needs will improve Outcome: Progressing   Problem: Clinical Measurements: Goal: Ability to maintain clinical measurements within normal limits will improve Outcome: Progressing Goal: Will remain free from infection Outcome: Progressing Goal: Diagnostic test results will improve Outcome: Progressing Goal: Respiratory complications will improve Outcome: Progressing Goal: Cardiovascular complication will be avoided Outcome: Progressing   Problem: Activity: Goal: Risk for activity intolerance will decrease Outcome: Progressing   Problem: Nutrition: Goal: Adequate nutrition will be maintained Outcome: Progressing   Problem: Coping: Goal: Level of anxiety will decrease Outcome: Progressing   Problem: Elimination: Goal: Will not experience complications related to bowel motility Outcome: Progressing Goal: Will not experience complications related to urinary retention Outcome: Progressing   Problem: Pain Managment: Goal: General experience of comfort will improve Outcome: Progressing   Problem: Safety: Goal: Ability to remain free from injury will improve Outcome: Progressing   Problem: Skin Integrity: Goal: Risk for impaired skin integrity will decrease Outcome: Progressing   Problem: Education: Goal: Knowledge of disease or condition will improve Outcome: Progressing Goal: Knowledge of secondary prevention will improve Outcome: Progressing Goal: Knowledge of patient specific risk factors addressed and post discharge goals established will improve Outcome: Progressing Goal: Individualized Educational Video(s) Outcome: Progressing   Problem: Coping: Goal: Will verbalize  positive feelings about self Outcome: Progressing Goal: Will identify appropriate support needs Outcome: Progressing   Problem: Health Behavior/Discharge Planning: Goal: Ability to manage health-related needs will improve Outcome: Progressing   Problem: Self-Care: Goal: Ability to participate in self-care as condition permits will improve Outcome: Progressing Goal: Verbalization of feelings and concerns over difficulty with self-care will improve Outcome: Progressing Goal: Ability to communicate needs accurately will improve Outcome: Progressing   Problem: Nutrition: Goal: Risk of aspiration will decrease Outcome: Progressing Goal: Dietary intake will improve Outcome: Progressing   Problem: Ischemic Stroke/TIA Tissue Perfusion: Goal: Complications of ischemic stroke/TIA will be minimized Outcome: Progressing   Problem: Fluid Volume: Goal: Hemodynamic stability will improve Outcome: Progressing   Problem: Clinical Measurements: Goal: Diagnostic test results will improve Outcome: Progressing Goal: Signs and symptoms of infection will decrease Outcome: Progressing   Problem: Respiratory: Goal: Ability to maintain adequate ventilation will improve Outcome: Progressing

## 2020-10-28 NOTE — Progress Notes (Signed)
PHARMACY - TOTAL PARENTERAL NUTRITION CONSULT NOTE  Indication: Perforated duodenum  Patient Measurements: Weight: 56.1 kg (123 lb 10.9 oz)   Body mass index is 19.37 kg/m. Usual Weight: 120-125 lbs  Assessment:  64 YOM presented on 10/24/2020 with right MCA occlusion and underwent revascularization. PMH significant for PUD post partial gastrectomy in 1990. Patient was started on a dysphagia diet on 10/08/20 and also transferred out of the ICU. Post-op course complicated by Afib RVR, aspiration PNA/cellulitis and AKI. He was then started on TF on 10/12/20, which ended on 10/13/20 when CT showed perinephric abscess with gas formation. Drain was placed and there is concern for fistulous communication to the colon on 10/16/20 CT. Nutrition has been inadequate since admission, and Pharmacy consulted for TPN management. Per patient's son, patient has no recent weight loss and was eating a balanced diet PTA.  11/25 - Patient off vasopressors and on minimal sedation. May consider extubation today per notes.  Glucose / Insulin: no hx DM - CBGs now controlled. Utilized 21 units aspart + 25 units regular insulin in bag yesterday. Insulin drip d/c'd 11/24 PM, Levemir d/c'd 11/25 Electrolytes: K 3.8 (goal >4 with Afib), no lasix planned, Mag 1.9 (goal >2 with Afib), Na high 149, Cl 112, CO2 26 (1:1 acetate in TPN), Phos 3, CoCa ~10.3; others stable WNL Renal: SCr trend down to 1.69 LFTs / TGs:  AST down to 103 / ALT up to 184, Tbili up to 2.1, TG normalized on 11/22 Prealbumin / albumin: albumin 1.1, prealbumin 9.4>8.2 Intake / Output; MIVF: UOP 1.7 ml/kg/hr, drain output down 32mL, chest tube output 90 mL, NGT removed; net +0.9L this admit (s/p lasix 11/25) GI Imaging: 11/20 CT - large IA fluid and blood in LUQ worrisome for active bleeding, renal cysts, cannot exclude fistulous communication with large bowel  Pending GI study vs CT to r/o further leak before initiating TF  Surgeries / Procedures: 11/20 -  intubated, thoracentesis -- drained cloudy, yellow appearing fluid 11/22 - VATS/decortication  Central access: PICC 10/17/20 TPN start date: 10/17/20  Nutritional Goals (per RD rec on 11/23): 1950 - 2240 kCal, 115 - 130g protein; >1.8L fluid per day  Goal TPN rate is 90 ml/hr - will provide 122g protein, 292g CHO, and 67g SMOF lipids, for total 2150 kCal  Current Nutrition:  TPN; NPO  Plan:  Continue TPN at goal rate 90 mL/hr at 1800 Electrolytes in TPN: cont Phos 5 mmol/L; decr Na 59mEq/L, cont K 50 mEq/L, remove Ca, incr Mag 71mEq/L, change 1:2 [id number] Continue standard MVI and trace elements to TPN Continue ICU resistant scale SSI + cont 25 units regular insulin to TPN bag and adjust as needed - none today, watch CBGs closely Monitor Surgery plans, TPN labs, I/O's  KCl 10 meq IV q1h x2 Mag 1 g IV x1  Thank you for involving pharmacy in this patient's care.  Loura Back, PharmD, BCPS Clinical Pharmacist Clinical phone for 10/28/2020 until 3p is [phone] 10/28/2020 7:11 AM  **Pharmacist phone directory can be found on [email or web address] listed under [facility]**

## 2020-10-28 NOTE — Progress Notes (Addendum)
ANTICOAGULATION CONSULT NOTE  Pharmacy Consult for Heparin Indication: Afib, s/p CVA  No Known Allergies  Patient Measurements: Weight: 56.1 kg (123 lb 10.9 oz) Heparin Dosing Weight:  56.7 kg  Vital Signs: Temp: 97 F (36.1 C) (11/28 1150) Temp Source: Axillary (11/28 1150) BP: 159/77 (11/28 1300) Pulse Rate: 86 (11/28 1300)  Labs: Recent Labs    10/27/20 0443 10/27/20 0443 10/27/20 0620 10/27/20 0621 10/27/20 2100 10/28/20 0444 10/28/20 0629 10/28/20 1330  HGB 8.5*   < > 8.7*  --   --  8.1*  --   --   HCT 26.3*  --  26.7*  --   --  24.6*  --   --   PLT 226  --  229  --   --  230  --   --   HEPARINUNFRC  --   --   --    < > 0.23* 0.35  --  0.22*  CREATININE 1.83*  --  1.78*  --   --   --  1.69*  --    < > = values in this interval not displayed.    Estimated Creatinine Clearance: 34.6 mL/min (A) (by C-G formula based on SCr of 1.69 mg/dL (H)).  Assessment: 41 YOM who presented on 11/5 with R MCA stroke s/p IR + stent placement for reocclusion. Noted to have new onset Afib and pharmacy was consulted to dose heparin. Patient with concern for GIB and L hemothorax now s/p VATS procedure originally holding heparin now pharmacy has been consulted to resume. Aiming for lower goal of 0.3-0.5 d/t R MCA stroke and concern for GIB. CBC improved.  Heparin level dropped to 0.22 on 1600 units/hr.  Hgb down some at 8.1 but stable in 8's. Platelets stable.  Some bloody drainage from JP drain noted - discussed with Dr. Tonia Brooms, ok to continue.     Goal of Therapy:  Heparin level 0.3 - 0.5 units/ml Monitor platelets by anticoagulation protocol: Yes    Plan:  Increase heparin drip to 1700 units/hr Follow-up daily HL and CBC  Link Snuffer, PharmD, BCPS, BCCCP Clinical Pharmacist Please refer to [facility] for [facility] Pharmacy numbers 10/28/2020 2:47 PM

## 2020-10-28 NOTE — Progress Notes (Signed)
      [address]       [place] [address]             [phone]      6 Days Post-Op Procedure(s) (LRB): VIDEO ASSISTED THORACOSCOPY (VATS)/DECORTICATION (Left)   Subjective:  Patient complains of pain at chest tube site.    Objective: Vital signs in last 24 hours: Temp:  [97.4 F (36.3 C)-99.4 F (37.4 C)] 97.4 F (36.3 C) (11/28 0753) Pulse Rate:  [76-89] 80 (11/28 0900) Cardiac Rhythm: Normal sinus rhythm (11/28 0800) Resp:  [19-36] 27 (11/28 0900) BP: (143-172)/(80-95) 154/84 (11/28 0900) SpO2:  [98 %-100 %] 99 % (11/28 0900)  Intake/Output from previous day: 11/27 0701 - 11/28 0700 In: 2767.6 [I.V.:2467.7; IV Piggyback:300] Out: 2370 [Urine:2250; Drains:30; Chest Tube:90] Intake/Output this shift: Total I/O In: 213.8 [I.V.:212; IV Piggyback:1.8] Out: 230 [Urine:200; Drains:30]  General appearance: cooperative, mild distress and appears ill Heart: regular rate and rhythm Lungs: diminished breath sounds bibasilar Wound: clean and dry  Lab Results: Recent Labs    10/27/20 0620 10/28/20 0444  WBC 11.2* 10.2  HGB 8.7* 8.1*  HCT 26.7* 24.6*  PLT 229 230   BMET:  Recent Labs    10/27/20 0620 10/28/20 0629  NA 148* 149*  K 2.9* 3.8  CL 109 112*  CO2 28 26  GLUCOSE 142* 123*  BUN 66* 62*  CREATININE 1.78* 1.69*  CALCIUM 7.9* 8.0*    PT/INR: No results for input(s): LABPROT, INR in the last 72 hours. ABG    Component Value Date/Time   PHART 7.302 (L) 10/20/2020 0359   HCO3 21.1 10/20/2020 0359   TCO2 22 10/20/2020 0359   ACIDBASEDEF 5.0 (H) 10/20/2020 0359   O2SAT 94.0 10/20/2020 0359   CBG (last 3)  Recent Labs    10/27/20 2336 10/28/20 0301 10/28/20 0806  GLUCAP 144* 140* 119*    Assessment/Plan: S/P Procedure(s) (LRB): VIDEO ASSISTED THORACOSCOPY (VATS)/DECORTICATION (Left)  1. Chest tube- output 150 cc yesterday (pleurovac is currently at 1900)... will get repeat CXR in AM if remains stable and if output  remains low will d/c chest tube in AM   LOS: 23 days    Lowella Dandy, PA-C 10/28/2020

## 2020-10-28 NOTE — Progress Notes (Signed)
ANTICOAGULATION CONSULT NOTE  Pharmacy Consult for Heparin Indication: Afib, s/p CVA  No Known Allergies  Patient Measurements: Weight: 56.1 kg (123 lb 10.9 oz) Heparin Dosing Weight:  56.7 kg  Vital Signs: Temp: 98.2 F (36.8 C) (11/28 0307) Temp Source: Oral (11/28 0307) BP: 163/82 (11/28 0500) Pulse Rate: 83 (11/28 0500)  Labs: Recent Labs    10/26/20 0500 10/26/20 0529 10/27/20 0443 10/27/20 0443 10/27/20 0620 10/27/20 0621 10/27/20 1446 10/27/20 2100 10/28/20 0444  HGB 8.8*   < > 8.5*   < > 8.7*  --   --   --  8.1*  HCT 26.8*   < > 26.3*  --  26.7*  --   --   --  24.6*  PLT 204   < > 226  --  229  --   --   --  230  HEPARINUNFRC  --    < >  --   --   --    < > 0.38 0.23* 0.35  CREATININE 2.49*  --  1.83*  --  1.78*  --   --   --   --    < > = values in this interval not displayed.    Estimated Creatinine Clearance: 32.8 mL/min (A) (by C-G formula based on SCr of 1.78 mg/dL (H)).  Assessment: 31 YOM who presented on 11/5 with R MCA stroke s/p IR + stent placement for reocclusion. Noted to have new onset Afib and pharmacy was consulted to dose heparin. Patient with concern for GIB and L hemothorax now s/p VATS procedure originally holding heparin now pharmacy has been consulted to resume. Aiming for lower goal of 0.3-0.5 d/t R MCA stroke and concern for GIB. CBC improved.  Heparin level is 0.35 on 1600 units/hr.  Hgb down some at 8.1 but stable in 8's. Platelets stable.  No overt bleeding noted.    Goal of Therapy:  Heparin level 0.3 - 0.5 units/ml Monitor platelets by anticoagulation protocol: Yes    Plan:  Continue heparin drip to 1600 units/hr Confirm with HL in 8 hours.  Daily HL and CBC   Link Snuffer, PharmD, BCPS, BCCCP Clinical Pharmacist Please refer to [facility] for [facility] Pharmacy numbers 10/28/2020 6:54 AM    Please see AMION for all pharmacy numbers 10/28/2020 6:54 AM

## 2020-10-28 NOTE — Progress Notes (Signed)
NAME:  Mark May, MRN:  [id number], DOB:  02/18/1956, LOS: 23 ADMISSION DATE:  10/30/2020, CONSULTATION DATE: 10/14/2020 REFERRING MD: Dr. Curtis SitesAshish Aurora, CHIEF COMPLAINT: Left-sided weakness  Brief History   64 year old male with HTN and HLD who presented with left-sided weakness, noted to have right MCA occlusion status post thrombectomy and stent placement 11/5.  Hospitalization complicated by hypoxic respiratory failure secondary to pulmonary edema requiring BiPAP and lasix.  Awaiting CIR placement however, on the evening of 11/10, he was noted to become more lethargic with tachycardia and tachypnea.  Had complained of abdominal pain with some distention and chest pain which resolved after burping.  KUB obtained which was normal.  On 11/11, he was somewhat more responsive but now with increasing sCr in which lisinopril was stopped and development of fever 102.6  [facility] initially consulted for help with medical management however on their evaluation, PCCM consulted given ill appearance and developing hypotension and new onset Afib with RVR found to have sepsis with RLL pneumonia likely due to aspiration started on zosyn. PCCM consulted for continue right abdominal pain and distension with concern for bowel perforation secondary to Cortrak.  On 11/13, CT abd/ pelvis showed large right perinephric fluid collection and pockets of air concerning for an infectious process/abscess. Underwent CT guided drain placement of right retroperitoneal abscess.  On 11/15, he developed melena with anemia.  GI consulted with plans to medically management and deferred EGD.  Surgery was consulted on 11/17 with imaging concerning for possible contained bowel perforation.  On 11/20, he developed respiratory distress requiring intubation. Underwent thoracentesis on 11/20.  Hypotensive with Hgb drob on 11/21, CT ABD/Pelvis w/ concern for active RP bleed.  Subsequently, developed hemothorax requiring OR for VATS on 11/22.    Past  Medical History  Hypertension Peptic ulcer disease, previous partial gastrectomy (in [place]/ 1990) Gout BPH Sciatica   Significant Hospital Events   11/05 Mechanical thrombectomy of right MCA and stent 11/5 11/11 PCCM reconsulted  11/12 Cortrak placed 11/14 CT guided drainage for R RP abscess with JP 11/17 PCCM reconsulted 11/18 CCM recalled by rapid response, fever, resp distress, CXR new left infiltrates with left pleural effusion 11/20 Thoracentesis  11/21 Hypotensive with Hgb drop to 6.1 s/p 2 units PRBC, CT ABD with concerning for active bleeding 11/22 To OR for VATS, decortication   Consults:  IR Neurology PCCM 11/5- 11/8; 11/11; 11/16-11/17, 11/18 -  [facility] Urology 11/13 GI 11/15 CCS 11/17  Procedures:  R Femoral Sheath 11/6 >> 11/6 RUE PICC 11/17 >>  ETT 11/20 >>   Significant Diagnostic Tests:  11/5 CT head >> No acute abnormality.  11/5 CTA head / neck >> Severe distal right M1 stenosis or subocclusive thrombus, Right MCA infarct with extensive penumbra, 4 mm right supraclinoid ICA aneurysm. Widely patent cervical carotid and vertebral arteries. 11/5 Postprocedure CT head >> Hypodensity in the right lateral temporal lobe now shows mild hemorrhage or contrast enhancement. This is most consistent with acute infarct. There has been interval stenting of the right middle cerebral artery. 11/6 MRI brain >> Fairly extensive patchy acute/early subacute infarcts within the right MCA/watershed territory.  Additional small acute/early subacute infarcts within the dorsal right thalamus and left basal ganglia. 11/6  MRA head >>  Interval stenting of the M1/M2 right middle cerebral artery. Flow related signal is present proximal and distal to the stent suggestive of stent patency. 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 11/6 TTE >> LVEF 70-75%, no RWMA,  Grade II diastolic dysfunction, elevated LA pressure 11/13 CT ABD/Pelvis >> large right  perinephric fluid collection and pockets of air concerning for an infectious process/abscess. Cholelithiasis. Nonobstructing right renal calculi. No hydronephrosis. Colonic diverticulosis. No bowel obstruction. Partially visualized small bilateral pleural effusions with complete consolidative changes of the visualized lower lobes. 11/16 CT ABD/Pelvis >> interval placement of drainage catheter into the right perinephric space in the area of previously seen gas and fluid collection. Fluid collection has decreased since prior study. There is now contrast material seen within the perinephric space and extending into the right paracolic gutter. This is concerning for possible fistulous communication to the colon. May consider contrast injection through the drainage catheter under fluoroscopic guidance to assess for enteric fistula. Moderate bilateral pleural effusions with bibasilar atelectasis or consolidation, unchanged.  Cholelithiasis.  NG tube in the stomach.  Aortic atherosclerosis.  Small to moderate free fluid in the pelvis. 11/20 L Thoracentesis 650 ml cloudy yellow fluid 11/21 CT abd/ pelvis >> very large area of intra-abdominal fluid and blood within the left upper quadrant, with an appearance worrisome for active bleeding.  Stable position of the right-sided percutaneous drainage catheter with a moderate amount of surrounding contained free air, fluid and inflammatory fat stranding. Fistulous communication with the adjacent portion of large bowel cannot be excluded. Moderate to marked amount of para muscular subcutaneous inflammatory fat stranding along the lateral aspects of the left abdominal and pelvic walls.   Micro Data:  MRSA PCR 11/5 >> negative COVID 11/5 >> negative Flu 11/5 >> negative UC 11/10 >> neg BCx2 11/11 >> neg BC x2 11/13 >> neg Right RRP abscess 11/14 >> moderate candida, klebsiella >>  BCx 2 11/19 >> Left pleural fluid 11/20 >> Klebsiella >>   Antimicrobials:  Cefazolin  11/5 >> 11/6 Flagyl 11/11 x1 Cefepime 11/11, 11/18 >> 11/19  Vanco 11/12 >> 11/16, 11/18  Unasyn 11/17 >> 11/18  Flagyl 11/18 >> 11/19  Diflucan 11/17 >>  Zosyn 11/11 >> 11/17, 11/19 >>   Interim history/subjective:   Doing better. Resting comfortably. Wife at bedside   Objective   Blood pressure (!) 163/82, pulse 83, temperature 98.2 F (36.8 C), temperature source Oral, resp. rate (!) 23, weight 56.1 kg, SpO2 99 %.        Intake/Output Summary (Last 24 hours) at 10/28/2020 0703 Last data filed at 10/28/2020 0630 Gross per 24 hour  Intake 2574.31 ml  Output 2370 ml  Net 204.31 ml   Filed Weights   30-Oct-2020 0100 10/25/20 0340 10/26/20 0500  Weight: 59.3 kg 59.3 kg 56.1 kg    Physical Exam General: comfortable, resting in bed  HEENT: dry mouth, tracking appropriately  Neuro: alert, following commands  CV: RRR, s1 s2  PULM: diminished BL GI: soft, nt nd  Extremities: no edema  Skin: no rash   Resolved Hospital Problems:  Hypernatremia  Assessment & Plan:   Complex clinical course: admitted with R MCA CVA.   Acute respiratory failure secondary to aspiration pneumonia with bilateral pleural effusion, left klebsiella empyema  Hemothorax following thoracentesis  S/p thora on 11/20 with exudative properties, klebsiella in pleural culture & RP abscess.  s/post thora extravasation on CTA chest  Plan: Weaning fio2 as tolerated Stable   Mixed Shock - Septic + ABLA / Hypovolemic Multifactorial, ABLA and septic. S/p 2 units blood on 11/21.  Klebsiella Empyema + fluid collection in abd.  Sepsis secondary to RLL PNA, Intra-abdominal Abscess Suspected aspiration and intraabdominal abscess (klebsiella and candida) in setting  of bowel perforation and now with left empyema . 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 Superior-most edge lines up with distal end of cortrak. P: per ccs   AKI  Plan: Follow UOP  PAF with RVR Tele   Acute R MCA CVA s/p thrombectomy and right MCA stent placement by IR with TICI 3 Left hemiplegia Dysphagia Plan: ASA   Severe Protein Calorie Malnutrition TPN continued    Best practice:  Diet: bowel rest/ NPO. Continue TPN Pain/Anxiety/Delirium protocol (if indicated):  Off  VAP protocol (if indicated): Oral care protocol DVT prophylaxis: SCDs for now GI prophylaxis: PPI  Glucose control: SSI Mobility: bed rest Code Status: full code Family Communication: daughter updated  Disposition:  ICU  LABS    PULMONARY No results for input(s): PHART, PCO2ART, PO2ART, HCO3, TCO2, O2SAT in the last 168 hours.  Invalid input(s): PCO2, PO2 CBC Recent Labs  Lab 10/27/20 0443 10/27/20 0620 10/28/20 0444  HGB 8.5* 8.7* 8.1*  HCT 26.3* 26.7* 24.6*  WBC 10.7* 11.2* 10.2  PLT 226 229 230   COAGULATION Recent Labs  Lab 10/01/2020 0446  INR 1.3*   CARDIAC  No results for input(s): TROPONINI in the last 168 hours. No results for input(s): PROBNP in the last 168 hours.  CHEMISTRY Recent Labs  Lab 10/09/2020 0446 10/14/2020 0446 10/23/20 0536 10/24/20 0750 10/25/20 0213 10/25/20 0213 10/25/20 0741 10/25/20 0741 10/26/20 0500 10/26/20 0500 10/27/20 0443 10/27/20 0620  NA 144   < > 139   < > 139  --  142  --  144  --  143 148*  K 4.7   < > 5.3*   < > 4.1   < > 3.5   < > 2.8*   < > 3.8 2.9*  CL 120*   < > 115*   < > 105  --  109  --  106  --  104 109  CO2 16*   < > 16*   < > 21*  --  21*  --  24  --  26 28  GLUCOSE 116*   < > 225*   < > 563*  --  130*  --  165*  --  433* 142*  BUN 61*   < > 64*   < > 72*  --  77*  --  80*  --  66* 66*  CREATININE 2.74*   < > 2.96*   < > 2.56*  --  2.67*  --  2.49*  --  1.83* 1.78*  CALCIUM 6.8*   < > 7.0*   < > 7.6*  --  7.5*  --  7.8*  --   7.7* 7.9*  MG 2.0  --   --   --  2.3  --  2.1  --  2.0  --   --   --   PHOS 4.9*   < > 6.3*  --  2.2*  --  2.4*  --  2.9  --  3.0  --    < > = values in this interval not displayed.   Estimated Creatinine Clearance: 32.8 mL/min (A) (by C-G formula based on SCr of 1.78 mg/dL (H)).  LIVER Recent Labs  Lab 10/21/20 1737 10/20/2020 0446 10/25/20 0213 10/25/20 0741 10/26/20  0500  AST 144* 170* 126* 103* 67*  ALT 80* 99* 201* 184* 128*  ALKPHOS 70 67 74 83 90  BILITOT 1.6* 1.7* 1.7* 2.1* 2.1*  PROT 5.0* 4.4* 4.6* 5.1* 5.3*  ALBUMIN 1.5* 1.3* 1.1* 1.1* 1.1*  INR  --  1.3*  --   --   --    INFECTIOUS No results for input(s): LATICACIDVEN, PROCALCITON in the last 168 hours. ENDOCRINE CBG (last 3)  Recent Labs    10/27/20 2011 10/27/20 2336 10/28/20 0301  GLUCAP 140* 144* 140*     Josephine Igo, DO [facility] 10/28/2020 7:03 AM

## 2020-10-29 ENCOUNTER — Encounter (HOSPITAL_COMMUNITY): Payer: Self-pay | Admitting: Anesthesiology

## 2020-10-29 ENCOUNTER — Encounter (HOSPITAL_COMMUNITY): Admission: EM | Disposition: E | Payer: Self-pay | Source: Home / Self Care | Attending: Pulmonary Disease

## 2020-10-29 ENCOUNTER — Inpatient Hospital Stay (HOSPITAL_COMMUNITY): Payer: 59

## 2020-10-29 DIAGNOSIS — K922 Gastrointestinal hemorrhage, unspecified: Secondary | ICD-10-CM | POA: Clinically undetermined

## 2020-10-29 DIAGNOSIS — A419 Sepsis, unspecified organism: Secondary | ICD-10-CM | POA: Diagnosis not present

## 2020-10-29 DIAGNOSIS — R6520 Severe sepsis without septic shock: Secondary | ICD-10-CM | POA: Diagnosis not present

## 2020-10-29 DIAGNOSIS — I469 Cardiac arrest, cause unspecified: Secondary | ICD-10-CM

## 2020-10-29 HISTORY — PX: ESOPHAGOGASTRODUODENOSCOPY: SHX5428

## 2020-10-29 LAB — CBC
HCT: 26 % — ABNORMAL LOW (ref 39.0–52.0)
HCT: 14.1 % — ABNORMAL LOW (ref 39.0–52.0)
HCT: 23 % — ABNORMAL LOW (ref 39.0–52.0)
Hemoglobin: 8.2 g/dL — ABNORMAL LOW (ref 13.0–17.0)
Hemoglobin: 4.1 g/dL — CL (ref 13.0–17.0)
Hemoglobin: 7 g/dL — ABNORMAL LOW (ref 13.0–17.0)
MCH: 27.6 pg (ref 26.0–34.0)
MCH: 28.5 pg (ref 26.0–34.0)
MCH: 28.9 pg (ref 26.0–34.0)
MCHC: 31.5 g/dL (ref 30.0–36.0)
MCHC: 29.1 g/dL — ABNORMAL LOW (ref 30.0–36.0)
MCHC: 30.4 g/dL (ref 30.0–36.0)
MCV: 87.5 fL (ref 80.0–100.0)
MCV: 97.9 fL (ref 80.0–100.0)
MCV: 95 fL (ref 80.0–100.0)
Platelets: 303 10*3/uL (ref 150–400)
Platelets: 212 10*3/uL (ref 150–400)
Platelets: 227 10*3/uL (ref 150–400)
RBC: 2.97 MIL/uL — ABNORMAL LOW (ref 4.22–5.81)
RBC: 1.44 MIL/uL — ABNORMAL LOW (ref 4.22–5.81)
RBC: 2.42 MIL/uL — ABNORMAL LOW (ref 4.22–5.81)
RDW: 19.4 % — ABNORMAL HIGH (ref 11.5–15.5)
RDW: 20.1 % — ABNORMAL HIGH (ref 11.5–15.5)
RDW: 18.1 % — ABNORMAL HIGH (ref 11.5–15.5)
WBC: 12.5 10*3/uL — ABNORMAL HIGH (ref 4.0–10.5)
WBC: 37.6 10*3/uL — ABNORMAL HIGH (ref 4.0–10.5)
WBC: 41.8 10*3/uL — ABNORMAL HIGH (ref 4.0–10.5)
nRBC: 0 % (ref 0.0–0.2)
nRBC: 2.3 % — ABNORMAL HIGH (ref 0.0–0.2)
nRBC: 2.6 % — ABNORMAL HIGH (ref 0.0–0.2)

## 2020-10-29 LAB — DIFFERENTIAL
Abs Immature Granulocytes: 0.24 10*3/uL — ABNORMAL HIGH (ref 0.00–0.07)
Basophils Absolute: 0 10*3/uL (ref 0.0–0.1)
Basophils Relative: 0 %
Eosinophils Absolute: 0.2 10*3/uL (ref 0.0–0.5)
Eosinophils Relative: 2 %
Immature Granulocytes: 2 %
Lymphocytes Relative: 10 %
Lymphs Abs: 1.3 10*3/uL (ref 0.7–4.0)
Monocytes Absolute: 0.7 10*3/uL (ref 0.1–1.0)
Monocytes Relative: 5 %
Neutro Abs: 10 10*3/uL — ABNORMAL HIGH (ref 1.7–7.7)
Neutrophils Relative %: 81 %

## 2020-10-29 LAB — COMPREHENSIVE METABOLIC PANEL
ALT: 124 U/L — ABNORMAL HIGH (ref 0–44)
AST: 89 U/L — ABNORMAL HIGH (ref 15–41)
Albumin: 1.4 g/dL — ABNORMAL LOW (ref 3.5–5.0)
Alkaline Phosphatase: 206 U/L — ABNORMAL HIGH (ref 38–126)
Anion gap: 12 (ref 5–15)
BUN: 56 mg/dL — ABNORMAL HIGH (ref 8–23)
CO2: 24 mmol/L (ref 22–32)
Calcium: 7.8 mg/dL — ABNORMAL LOW (ref 8.9–10.3)
Chloride: 111 mmol/L (ref 98–111)
Creatinine, Ser: 1.46 mg/dL — ABNORMAL HIGH (ref 0.61–1.24)
GFR, Estimated: 53 mL/min — ABNORMAL LOW (ref >60)
Glucose, Bld: 175 mg/dL — ABNORMAL HIGH (ref 70–99)
Potassium: 4 mmol/L (ref 3.5–5.1)
Sodium: 147 mmol/L — ABNORMAL HIGH (ref 135–145)
Total Bilirubin: 1.3 mg/dL — ABNORMAL HIGH (ref 0.3–1.2)
Total Protein: 5.9 g/dL — ABNORMAL LOW (ref 6.5–8.1)

## 2020-10-29 LAB — POCT I-STAT 7, (LYTES, BLD GAS, ICA,H+H)
Acid-base deficit: 10 mmol/L — ABNORMAL HIGH (ref 0.0–2.0)
Bicarbonate: 17 mmol/L — ABNORMAL LOW (ref 20.0–28.0)
Calcium, Ion: 0.95 mmol/L — ABNORMAL LOW (ref 1.15–1.40)
HCT: 16 % — ABNORMAL LOW (ref 39.0–52.0)
Hemoglobin: 5.4 g/dL — CL (ref 13.0–17.0)
O2 Saturation: 100 %
Potassium: 4.5 mmol/L (ref 3.5–5.1)
Sodium: 149 mmol/L — ABNORMAL HIGH (ref 135–145)
TCO2: 18 mmol/L — ABNORMAL LOW (ref 22–32)
pCO2 arterial: 43.7 mmHg (ref 32.0–48.0)
pH, Arterial: 7.198 — CL (ref 7.350–7.450)
pO2, Arterial: 400 mmHg — ABNORMAL HIGH (ref 83.0–108.0)

## 2020-10-29 LAB — BLOOD GAS, ARTERIAL
Acid-base deficit: 11.4 mmol/L — ABNORMAL HIGH (ref 0.0–2.0)
Bicarbonate: 13.4 mmol/L — ABNORMAL LOW (ref 20.0–28.0)
Drawn by: 51155
FIO2: 50
O2 Saturation: 98.8 %
Patient temperature: 36.5
pCO2 arterial: 25.8 mmHg — ABNORMAL LOW (ref 32.0–48.0)
pH, Arterial: 7.332 — ABNORMAL LOW (ref 7.350–7.450)
pO2, Arterial: 170 mmHg — ABNORMAL HIGH (ref 83.0–108.0)

## 2020-10-29 LAB — BASIC METABOLIC PANEL
Anion gap: 21 — ABNORMAL HIGH (ref 5–15)
Anion gap: 29 — ABNORMAL HIGH (ref 5–15)
BUN: 60 mg/dL — ABNORMAL HIGH (ref 8–23)
BUN: 63 mg/dL — ABNORMAL HIGH (ref 8–23)
CO2: 12 mmol/L — ABNORMAL LOW (ref 22–32)
CO2: 15 mmol/L — ABNORMAL LOW (ref 22–32)
Calcium: 8.2 mg/dL — ABNORMAL LOW (ref 8.9–10.3)
Calcium: 6.4 mg/dL — CL (ref 8.9–10.3)
Chloride: 114 mmol/L — ABNORMAL HIGH (ref 98–111)
Chloride: 101 mmol/L (ref 98–111)
Creatinine, Ser: 1.94 mg/dL — ABNORMAL HIGH (ref 0.61–1.24)
Creatinine, Ser: 2.41 mg/dL — ABNORMAL HIGH (ref 0.61–1.24)
GFR, Estimated: 38 mL/min — ABNORMAL LOW (ref >60)
GFR, Estimated: 29 mL/min — ABNORMAL LOW (ref >60)
Glucose, Bld: 236 mg/dL — ABNORMAL HIGH (ref 70–99)
Glucose, Bld: 265 mg/dL — ABNORMAL HIGH (ref 70–99)
Potassium: 7.1 mmol/L (ref 3.5–5.1)
Potassium: 5.1 mmol/L (ref 3.5–5.1)
Sodium: 147 mmol/L — ABNORMAL HIGH (ref 135–145)
Sodium: 145 mmol/L (ref 135–145)

## 2020-10-29 LAB — GLUCOSE, CAPILLARY
Glucose-Capillary: 158 mg/dL — ABNORMAL HIGH (ref 70–99)
Glucose-Capillary: 108 mg/dL — ABNORMAL HIGH (ref 70–99)
Glucose-Capillary: 147 mg/dL — ABNORMAL HIGH (ref 70–99)
Glucose-Capillary: 150 mg/dL — ABNORMAL HIGH (ref 70–99)
Glucose-Capillary: 154 mg/dL — ABNORMAL HIGH (ref 70–99)
Glucose-Capillary: 216 mg/dL — ABNORMAL HIGH (ref 70–99)

## 2020-10-29 LAB — PROTIME-INR
INR: 2.7 — ABNORMAL HIGH (ref 0.8–1.2)
Prothrombin Time: 28.1 seconds — ABNORMAL HIGH (ref 11.4–15.2)

## 2020-10-29 LAB — HEPARIN LEVEL (UNFRACTIONATED): Heparin Unfractionated: 0.34 IU/mL (ref 0.30–0.70)

## 2020-10-29 LAB — MAGNESIUM
Magnesium: 2.5 mg/dL — ABNORMAL HIGH (ref 1.7–2.4)
Magnesium: 3.4 mg/dL — ABNORMAL HIGH (ref 1.7–2.4)

## 2020-10-29 LAB — TRIGLYCERIDES: Triglycerides: 109 mg/dL (ref <150)

## 2020-10-29 LAB — PREPARE RBC (CROSSMATCH)

## 2020-10-29 LAB — PREALBUMIN: Prealbumin: 15.3 mg/dL — ABNORMAL LOW (ref 18–38)

## 2020-10-29 LAB — LACTIC ACID, PLASMA
Lactic Acid, Venous: >11 mmol/L (ref 0.5–1.9)
Lactic Acid, Venous: >11 mmol/L (ref 0.5–1.9)

## 2020-10-29 LAB — TROPONIN I (HIGH SENSITIVITY): Troponin I (High Sensitivity): 123 ng/L (ref <18)

## 2020-10-29 LAB — PHOSPHORUS: Phosphorus: 2.9 mg/dL (ref 2.5–4.6)

## 2020-10-29 SURGERY — EGD (ESOPHAGOGASTRODUODENOSCOPY)
Anesthesia: Monitor Anesthesia Care

## 2020-10-29 MED ORDER — MIDAZOLAM HCL (PF) 10 MG/2ML IJ SOLN
INTRAMUSCULAR | Status: DC | PRN
  Administered 2020-10-29: 1 mg via INTRAVENOUS
  Administered 2020-10-29: 2 mg via INTRAVENOUS

## 2020-10-29 MED ORDER — FENTANYL CITRATE (PF) 100 MCG/2ML IJ SOLN
INTRAMUSCULAR | Status: AC
  Filled 2020-10-29: qty 4

## 2020-10-29 MED ORDER — CALCIUM GLUCONATE-NACL 1-0.675 GM/50ML-% IV SOLN
1.0000 g | Freq: Once | INTRAVENOUS | Status: AC
  Administered 2020-10-30: 1000 mg via INTRAVENOUS
  Filled 2020-10-29: qty 50

## 2020-10-29 MED ORDER — IOHEXOL 9 MG/ML PO SOLN
500.0000 mL | ORAL | Status: AC
  Administered 2020-10-29 (×2): 500 mL via ORAL

## 2020-10-29 MED ORDER — SODIUM BICARBONATE 8.4 % IV SOLN
INTRAVENOUS | Status: AC
  Administered 2020-10-29: 100 meq
  Filled 2020-10-29: qty 100

## 2020-10-29 MED ORDER — FENTANYL CITRATE (PF) 100 MCG/2ML IJ SOLN
25.0000 ug | INTRAMUSCULAR | Status: DC | PRN
  Filled 2020-10-29: qty 2

## 2020-10-29 MED ORDER — DOCUSATE SODIUM 50 MG/5ML PO LIQD
100.0000 mg | Freq: Two times a day (BID) | ORAL | Status: DC
  Administered 2020-10-30: 100 mg
  Filled 2020-10-29: qty 10

## 2020-10-29 MED ORDER — ATROPINE SULFATE 1 MG/10ML IJ SOSY
PREFILLED_SYRINGE | INTRAMUSCULAR | Status: AC
  Filled 2020-10-29: qty 10

## 2020-10-29 MED ORDER — FENTANYL CITRATE (PF) 100 MCG/2ML IJ SOLN
INTRAMUSCULAR | Status: AC
  Administered 2020-10-29: 50 ug via INTRAVENOUS
  Filled 2020-10-29: qty 2

## 2020-10-29 MED ORDER — FENTANYL CITRATE (PF) 100 MCG/2ML IJ SOLN
INTRAMUSCULAR | Status: AC
  Filled 2020-10-29: qty 2

## 2020-10-29 MED ORDER — SODIUM CHLORIDE 0.9% IV SOLUTION: Freq: Once | INTRAVENOUS | Status: DC

## 2020-10-29 MED ORDER — PANTOPRAZOLE SODIUM 40 MG IV SOLR
40.0000 mg | Freq: Two times a day (BID) | INTRAVENOUS | Status: DC
  Administered 2020-11-02 – 2020-11-04 (×5): 40 mg via INTRAVENOUS
  Filled 2020-10-29 (×5): qty 40

## 2020-10-29 MED ORDER — VASOPRESSIN 20 UNITS/100 ML INFUSION FOR SHOCK
0.0000 [IU]/min | INTRAVENOUS | Status: DC
  Administered 2020-10-29: 0.03 [IU]/min via INTRAVENOUS
  Administered 2020-10-30: 0.04 [IU]/min via INTRAVENOUS
  Filled 2020-10-29 (×2): qty 100

## 2020-10-29 MED ORDER — NOREPINEPHRINE 4 MG/250ML-% IV SOLN
INTRAVENOUS | Status: AC
  Administered 2020-10-29: 4 mg
  Filled 2020-10-29: qty 250

## 2020-10-29 MED ORDER — NOREPINEPHRINE 4 MG/250ML-% IV SOLN
0.0000 ug/min | INTRAVENOUS | Status: DC
  Administered 2020-10-29: 4 ug/min via INTRAVENOUS
  Administered 2020-10-29 (×3): 70 ug/min via INTRAVENOUS
  Administered 2020-10-29: 60 ug/min via INTRAVENOUS
  Filled 2020-10-29 (×4): qty 250

## 2020-10-29 MED ORDER — NOREPINEPHRINE 4 MG/250ML-% IV SOLN
INTRAVENOUS | Status: AC
  Filled 2020-10-29: qty 250

## 2020-10-29 MED ORDER — PROTAMINE SULFATE 10 MG/ML IV SOLN
20.0000 mg | Freq: Once | INTRAVENOUS | Status: AC
  Administered 2020-10-29: 20 mg via INTRAVENOUS
  Filled 2020-10-29: qty 2

## 2020-10-29 MED ORDER — SODIUM CHLORIDE 0.9 % IV SOLN
8.0000 mg/h | INTRAVENOUS | Status: AC
  Administered 2020-10-29 – 2020-11-01 (×5): 8 mg/h via INTRAVENOUS
  Filled 2020-10-29 (×7): qty 80

## 2020-10-29 MED ORDER — FENTANYL CITRATE (PF) 100 MCG/2ML IJ SOLN
INTRAMUSCULAR | Status: DC | PRN
  Administered 2020-10-29: 25 ug via INTRAVENOUS

## 2020-10-29 MED ORDER — TRAVASOL 10 % IV SOLN
INTRAVENOUS | Status: DC
  Filled 2020-10-29: qty 1220.4

## 2020-10-29 MED ORDER — EPINEPHRINE 1 MG/10ML IJ SOSY
PREFILLED_SYRINGE | INTRAMUSCULAR | Status: AC
  Filled 2020-10-29: qty 20

## 2020-10-29 MED ORDER — MIDAZOLAM HCL 2 MG/2ML IJ SOLN
1.0000 mg | INTRAMUSCULAR | Status: DC | PRN
  Administered 2020-10-29 – 2020-10-30 (×3): 1 mg via INTRAVENOUS
  Filled 2020-10-29 (×2): qty 2

## 2020-10-29 MED ORDER — MIDAZOLAM HCL (PF) 5 MG/ML IJ SOLN
INTRAMUSCULAR | Status: AC
  Filled 2020-10-29: qty 2

## 2020-10-29 MED ORDER — SODIUM BICARBONATE 8.4 % IV SOLN
INTRAVENOUS | Status: DC
  Filled 2020-10-29 (×9): qty 850

## 2020-10-29 MED ORDER — LACTATED RINGERS IV BOLUS
1000.0000 mL | Freq: Once | INTRAVENOUS | Status: AC
  Administered 2020-10-29: 1000 mL via INTRAVENOUS

## 2020-10-29 MED ORDER — MIDAZOLAM HCL 2 MG/2ML IJ SOLN
1.0000 mg | INTRAMUSCULAR | Status: DC | PRN
  Filled 2020-10-29: qty 2

## 2020-10-29 MED ORDER — IOHEXOL 9 MG/ML PO SOLN
ORAL | Status: AC
  Filled 2020-10-29: qty 1000

## 2020-10-29 MED ORDER — FENTANYL CITRATE (PF) 100 MCG/2ML IJ SOLN
25.0000 ug | INTRAMUSCULAR | Status: DC | PRN
  Administered 2020-10-30 – 2020-11-03 (×4): 100 ug via INTRAVENOUS
  Filled 2020-10-29 (×3): qty 2

## 2020-10-29 MED ORDER — POLYETHYLENE GLYCOL 3350 17 G PO PACK: 17.0000 g | PACK | Freq: Every day | ORAL | Status: DC

## 2020-10-29 MED ORDER — ONDANSETRON HCL 4 MG/2ML IJ SOLN
4.0000 mg | Freq: Once | INTRAMUSCULAR | Status: AC
  Administered 2020-10-29: 4 mg via INTRAVENOUS
  Filled 2020-10-29: qty 2

## 2020-10-29 MED ORDER — SODIUM CHLORIDE 0.9 % IV SOLN
80.0000 mg | Freq: Once | INTRAVENOUS | Status: AC
  Administered 2020-10-29: 80 mg via INTRAVENOUS
  Filled 2020-10-29: qty 80

## 2020-10-29 MED ORDER — NOREPINEPHRINE 16 MG/250ML-% IV SOLN
0.0000 ug/min | INTRAVENOUS | Status: DC
  Administered 2020-10-29: 60 ug/min via INTRAVENOUS
  Administered 2020-10-30: 55 ug/min via INTRAVENOUS
  Administered 2020-10-31: 10 ug/min via INTRAVENOUS
  Administered 2020-11-01: 8 ug/min via INTRAVENOUS
  Administered 2020-11-03: 13 ug/min via INTRAVENOUS
  Administered 2020-11-04 (×2): 70 ug/min via INTRAVENOUS
  Administered 2020-11-04: 60 ug/min via INTRAVENOUS
  Filled 2020-10-29 (×9): qty 250

## 2020-10-29 NOTE — Progress Notes (Signed)
Critical ABG results given to Dr Byrum. 

## 2020-10-29 NOTE — Op Note (Addendum)
[facility] Patient Name: Mark May Procedure Date : 10/12/2020 MRN: [id number] Attending MD: Shirley Friar , MD Date of Birth: 08-26-1956 CSN: [id number] Age: 64 Admit Type: Inpatient Procedure:                Upper GI endoscopy Indications:              Active gastrointestinal bleeding Providers:                Shirley Friar, MD, Glory Rosebush, RN, Lawson Radar, Technician, Rozetta Nunnery, Technician Referring MD:             hospital team Medicines:                Midazolam 3 mg IV, Fentanyl 25 micrograms IV;                            Intubated Complications:            No immediate complications. Estimated Blood Loss:     Estimated blood loss: none from endoscope. Procedure:                Pre-Anesthesia Assessment:                           - Prior to the procedure, a History and Physical                            was performed, and patient medications and                            allergies were reviewed. The patient's tolerance of                            previous anesthesia was also reviewed. The risks                            and benefits of the procedure and the sedation                            options and risks were discussed with the patient.                            All questions were answered, and informed consent                            was obtained. Prior Anticoagulants: The patient has                            taken no previous anticoagulant or antiplatelet                            agents. ASA Grade Assessment: V - A moribund  patient who is not expected to survive without the                            operation. After reviewing the risks and benefits,                            the patient was deemed in satisfactory condition to                            undergo the procedure.                           After obtaining informed consent, the endoscope was                             passed under direct vision. Throughout the                            procedure, the patient's blood pressure, pulse, and                            oxygen saturations were monitored continuously. The                            GIF-H190 ([id number] Olympus gastroscope was                            introduced through the mouth, and advanced to the                            afferent and efferent jejunal loops. The upper GI                            endoscopy was performed with difficulty due to                            excessive bleeding. During the procedure patient                            clamped down and broke the bite block preventing                            further safe passage of the endoscope and a tongue                            depressor had to be used temporarily to allow                            further exploration of the blood in the stomach.                            Clots were noted which could not be suctioned away.  Procedure was terminated at that point. Scope In: Scope Out: Findings:      The examined esophagus was normal.      Evidence of an antrectomy was found in the gastric body. This was       characterized by large amount of dark red and red blood preventing       visualization.      Red blood was found in the entire examined stomach.      Red blood was found in the jejunum. Unable to differentiate which limb       contained more blood. Impression:               - Normal esophagus.                           - An antrectomy was found, characterized by large                            amount of dark red and red blood preventing                            visualization.                           - Red blood in the entire stomach.                           - Jejunal blood.                           - No specimens collected. Recommendation:           - Aggressive volume resuscitation and send for                             updated CT if stabilizes enough for it. Unable to                            pinpoint source of bleeding but amount of blood                            concerning for relation to recurrent fluid                            collections and question of contained perforation.                            Consider bleeding scan if updated CT not revealing                            vs exploratory laparoscopy. (Daughter reports that                            she does not currently want him to have surgery if                            needed).                           -  Observe patient's clinical course.                           - Give Protonix (pantoprazole): 8 mg/hr IV by                            continuous infusion. Procedure Code(s):        --- Professional ---                           [phone], Esophagogastroduodenoscopy, flexible,                            transoral; diagnostic, including collection of                            specimen(s) by brushing or washing, when performed                            (separate procedure) Diagnosis Code(s):        --- Professional ---                           K92.2, Gastrointestinal hemorrhage, unspecified                           Z90.3, Acquired absence of stomach [part of] CPT copyright 2019 [employer]. All rights reserved. The codes documented in this report are preliminary and upon coder review may  be revised to meet current compliance requirements. Shirley Friar, MD 10/18/2020 11:52:13 PM This report has been signed electronically. Number of Addenda: 0

## 2020-10-29 NOTE — Progress Notes (Addendum)
Occupational Therapy Treatment Patient Details Name: Mark May MRN: [id number] DOB: 1956-07-06 Today's Date: 10/25/2020    History of present illness Pt is 64 year old male with hypertension and hyperlipidemia who presented with left-sided weakness, noted to have right MCA occlusion status post thrombectomy and stent placement 11/5.  Hospitalization complicated by hypoxic respiratory failure secondary to pulmonary edema requiring BiPAP and lasix. On 10/10/2020 he became more lethargic and tachycardic with tachypnea.  Patient went into A. fib with RVR ,also had a fever. Pt with sepsis and RLL PNA.  Placement of drain for R retroperitoneal abscess on 11/14, contained bowel perforation.  Pt with continued abdominal pain , he had NGtube added on 10/18/20.11/20 intubated with thoracentesis; extubated 11/26; VATS 11/22 due to hemothorax.    OT comments  Attempted to use Mandarin stratus interpreter during session however pt's daughter talking over interpreter and interpreter unable to hear pt, therefore pt's daughter interpreted during session. Pt making slow progress toward goals and session limited by complaints of nausea and pt vomiting x 2 during session - nsg aware. Pt demonstrated minimal activation of L shoulder elevation, otherwise LUE flaccid. Tape intact and positioned appropriately without apparent adverse affects. Pt reports no discomfort with tape. Attempted to activate trunk in semi reclined position and again in modified chair position. Pt distracted by pain/nausea and required mod cues for redirection. Educated daughter on importance of keeping LUE elevated on 2 pillows due to subluxed shoulder and to decrease dependent edema in hand. Began education on BUE A/AA/PROM. Daughter states she has not seen her Dad move his L leg/arm. Recommend rehab at SNF. Will continue to follow acutely and modify plan of care as appropriate.  Follow Up Recommendations  Supervision/Assistance - 24 hour;SNF     Equipment Recommendations  Wheelchair (measurements OT);Wheelchair cushion (measurements OT);Hospital bed;3 in 1 bedside commode    Recommendations for Other Services      Precautions / Restrictions Precautions Precautions: Fall Precaution Comments: L hemi; watch O2; R flank JP drain; L chest tube; TPN       Mobility Bed Mobility Overal bed mobility: Needs Assistance   Rolling: Max assist         General bed mobility comments: total A +2 to scoot up in bed; placed in modified chair position  Transfers                 General transfer comment: not attempted this date    Balance     Sitting balance-Leahy Scale: Poor Sitting balance - Comments: L lat lean                                   ADL either performed or assessed with clinical judgement   ADL Overall ADL's : Needs assistance/impaired     Grooming: Moderate assistance;Sitting;Bed level;Oral care Grooming Details (indicate cue type and reason): oral care with toothette using suction                                     Vision   Additional Comments: will continue to assess; note L inattention   Perception     Praxis      Cognition Arousal/Alertness: Awake/alert Behavior During Therapy: Flat affect;Restless Overall Cognitive Status: Difficult to assess Area of Impairment: Attention;Following commands;Safety/judgement;Problem solving;Awareness  Current Attention Level: Sustained   Following Commands: Follows one step commands with increased time Safety/Judgement: Decreased awareness of safety;Decreased awareness of deficits Awareness: Intellectual Problem Solving: Slow processing;Decreased initiation General Comments: difficulty to assess due to language barrier        Exercises General Exercises - Upper Extremity Shoulder Flexion: PROM;Left;5 reps;Supine;Right Shoulder ABduction: PROM;Left;5 reps Elbow Flexion: PROM;Left;5  reps Elbow Extension: PROM;Left;5 reps;Supine Wrist Flexion: PROM;Left;5 reps;Supine Wrist Extension: PROM;Left;5 reps;Supine Digit Composite Flexion: PROM;Left;5 reps;Supine Composite Extension: PROM;Left;5 reps;Supine General Exercises - Lower Extremity Ankle Circles/Pumps: PROM;AAROM;Both;5 reps;Supine Heel Slides: PROM;AAROM;Both;5 reps;Supine (daughter performing) Other Exercises Other Exercises: rolling R/L with HOB elevated @ 30 degrees; initiation of head followed by shoulders; hips flexed and rotated - attempted x 5 each side. Note increased carry over with repetition   Shoulder Instructions       General Comments      Pertinent Vitals/ Pain       Pain Assessment: Faces Faces Pain Scale: Hurts little more Pain Location: chest with coughing; RLE Pain Descriptors / Indicators: Grimacing;Discomfort Pain Intervention(s): Limited activity within patient's tolerance  Home Living                                          Prior Functioning/Environment              Frequency  Min 2X/week        Progress Toward Goals  OT Goals(current goals can now be found in the care plan section)  Progress towards OT goals: Not progressing toward goals - comment (due to fatigue/vomiting)  Acute Rehab OT Goals Patient Stated Goal: per daughter for pt to get stronger OT Goal Formulation: With patient/family Time For Goal Achievement: 11/09/20 Potential to Achieve Goals: Good ADL Goals Pt Will Perform Grooming: with modified independence;sitting Pt Will Perform Upper Body Dressing: with mod assist;sitting Pt Will Transfer to Toilet: with mod assist;with +2 assist;squat pivot transfer;bedside commode Additional ADL Goal #1: Pt will visually scan and locate 2 objects in L visual field with mod VC's Additional ADL Goal #2: Pt will follow 2 step ADL commands with 75% accuracy Additional ADL Goal #3: Pt will sit EOB with min A for 5 mins while using LUE as dependent  stabilizer  Plan Frequency remains appropriate;Discharge plan needs to be updated    Co-evaluation                 AM-PAC OT "6 Clicks" Daily Activity     Outcome Measure   Help from another person eating meals?: Total Help from another person taking care of personal grooming?: A Lot Help from another person toileting, which includes using toliet, bedpan, or urinal?: Total Help from another person bathing (including washing, rinsing, drying)?: Total Help from another person to put on and taking off regular upper body clothing?: Total Help from another person to put on and taking off regular lower body clothing?: Total 6 Click Score: 7    End of Session Equipment Utilized During Treatment: Oxygen  OT Visit Diagnosis: Unsteadiness on feet (R26.81);Muscle weakness (generalized) (M62.81);Other symptoms and signs involving cognitive function;Hemiplegia and hemiparesis Hemiplegia - Right/Left: Left Hemiplegia - dominant/non-dominant: Non-Dominant Hemiplegia - caused by: Cerebral infarction   Activity Tolerance Patient limited by fatigue;Other (comment) (pt vomited x 2)   Patient Left in bed;with call bell/phone within reach;with family/visitor present;with SCD's reapplied (modified chair position)   Nurse Communication  Mobility status        Time: 1315-1355 OT Time Calculation (min): 40 min  Charges: OT General Charges $OT Visit: 1 Visit OT Treatments $Self Care/Home Management : 8-22 mins $Therapeutic Activity: 8-22 mins $Neuromuscular Re-education: 8-22 mins  Luisa Dago, OT/L   Acute OT Clinical Specialist Acute Rehabilitation Services Pager [phone] Office [phone]    [facility] 10/11/2020, 2:43 PM

## 2020-10-29 NOTE — Progress Notes (Addendum)
NAME:  Mark May, MRN:  [id number], DOB:  [birth year], LOS: 24 ADMISSION DATE:  10/09/2020, CONSULTATION DATE: 10/10/2020 REFERRING MD: Dr. Curtis Sites, CHIEF COMPLAINT: Left-sided weakness  Brief History   64 year old male with HTN and HLD who presented with left-sided weakness, noted to have right MCA occlusion status post thrombectomy and stent placement 11/5.  Hospitalization complicated by hypoxic respiratory failure secondary to pulmonary edema requiring BiPAP and lasix.  Awaiting CIR placement however, on the evening of 11/10, he was noted to become more lethargic with tachycardia and tachypnea.  Had complained of abdominal pain with some distention and chest pain which resolved after burping.  KUB obtained which was normal.  On 11/11, he was somewhat more responsive but now with increasing sCr in which lisinopril was stopped and development of fever 102.6  TR[facility] initially consulted for help with medical management however on their evaluation, PCCM consulted given ill appearance and developing hypotension and new onset Afib with RVR found to have sepsis with RLL pneumonia likely due to aspiration started on zosyn. PCCM consulted for continue right abdominal pain and distension with concern for bowel perforation secondary to Cortrak.  On 11/13, CT abd/ pelvis showed large right perinephric fluid collection and pockets of air concerning for an infectious process/abscess. Underwent CT guided drain placement of right retroperitoneal abscess.  On 11/15, he developed melena with anemia.  GI consulted with plans to medically management and deferred EGD.  Surgery was consulted on 11/17 with imaging concerning for possible contained bowel perforation.  On 11/20, he developed respiratory distress requiring intubation. Underwent thoracentesis on 11/20.  Hypotensive with Hgb drob on 11/21, CT ABD/Pelvis w/ concern for active RP bleed.  Subsequently, developed hemothorax requiring OR for VATS on 11/22.    Past  Medical History  Hypertension Peptic ulcer disease, previous partial gastrectomy (in [place]/ 1990) Gout BPH Sciatica   Significant Hospital Events   11/05 Mechanical thrombectomy of right MCA and stent 11/5 11/11 PCCM reconsulted  11/12 Cortrak placed 11/14 CT guided drainage for R RP abscess with JP 11/17 PCCM reconsulted 11/18 CCM recalled by rapid response, fever, resp distress, CXR new left infiltrates with left pleural effusion 11/20 Thoracentesis  11/21 Hypotensive with Hgb drop to 6.1 s/p 2 units PRBC, CT ABD with concerning for active bleeding 11/22 To OR for VATS, decortication   Consults:  IR Neurology PCCM 11/5- 11/8; 11/11; 11/16-11/17, 11/18 -  TR[facility] Urology 11/13 GI 11/15 CCS 11/17  Procedures:  R Femoral Sheath 11/6 >> 11/6 RUE PICC 11/17 >>  ETT 11/20 >>   Significant Diagnostic Tests:  11/5 CT head >> No acute abnormality.  11/5 CTA head / neck >> Severe distal right M1 stenosis or subocclusive thrombus, Right MCA infarct with extensive penumbra, 4 mm right supraclinoid ICA aneurysm. Widely patent cervical carotid and vertebral arteries. 11/5 Postprocedure CT head >> Hypodensity in the right lateral temporal lobe now shows mild hemorrhage or contrast enhancement. This is most consistent with acute infarct. There has been interval stenting of the right middle cerebral artery. 11/6 MRI brain >> Fairly extensive patchy acute/early subacute infarcts within the right MCA/watershed territory.  Additional small acute/early subacute infarcts within the dorsal right thalamus and left basal ganglia. 11/6  MRA head >>  Interval stenting of the M1/M2 right middle cerebral artery. Flow related signal is present proximal and distal to the stent suggestive of stent patency. 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 11/6 TTE >> LVEF 70-75%, no RWMA,  Grade II diastolic dysfunction, elevated LA pressure 11/13 CT ABD/Pelvis >> large right  perinephric fluid collection and pockets of air concerning for an infectious process/abscess. Cholelithiasis. Nonobstructing right renal calculi. No hydronephrosis. Colonic diverticulosis. No bowel obstruction. Partially visualized small bilateral pleural effusions with complete consolidative changes of the visualized lower lobes. 11/16 CT ABD/Pelvis >> interval placement of drainage catheter into the right perinephric space in the area of previously seen gas and fluid collection. Fluid collection has decreased since prior study. There is now contrast material seen within the perinephric space and extending into the right paracolic gutter. This is concerning for possible fistulous communication to the colon. May consider contrast injection through the drainage catheter under fluoroscopic guidance to assess for enteric fistula. Moderate bilateral pleural effusions with bibasilar atelectasis or consolidation, unchanged.  Cholelithiasis.  NG tube in the stomach.  Aortic atherosclerosis.  Small to moderate free fluid in the pelvis. 11/20 L Thoracentesis 650 ml cloudy yellow fluid 11/21 CT abd/ pelvis >> very large area of intra-abdominal fluid and blood within the left upper quadrant, with an appearance worrisome for active bleeding.  Stable position of the right-sided percutaneous drainage catheter with a moderate amount of surrounding contained free air, fluid and inflammatory fat stranding. Fistulous communication with the adjacent portion of large bowel cannot be excluded. Moderate to marked amount of para muscular subcutaneous inflammatory fat stranding along the lateral aspects of the left abdominal and pelvic walls.   Micro Data:  MRSA PCR 11/5 >> negative COVID 11/5 >> negative Flu 11/5 >> negative UC 11/10 >> neg BCx2 11/11 >> neg BC x2 11/13 >> neg Right RRP abscess 11/14 >> moderate candida, klebsiella >>  BCx 2 11/19 >> Left pleural fluid 11/20 >> Klebsiella >>   Antimicrobials:  Cefazolin  11/5 >> 11/6 Flagyl 11/11 x1 Cefepime 11/11, 11/18 >> 11/19  Vanco 11/12 >> 11/16, 11/18  Unasyn 11/17 >> 11/18  Flagyl 11/18 >> 11/19  Diflucan 11/17 >>  Zosyn 11/11 >> 11/17, 11/19 >>   Interim history/subjective:   C/o R LE pain, abdominal pain - daughter helps w translation Has the hiccoughs   Objective   Blood pressure (!) 190/90, pulse (!) 102, temperature 98.1 F (36.7 C), resp. rate (!) 27, weight 56.1 kg, SpO2 97 %.        Intake/Output Summary (Last 24 hours) at 10/11/2020 1113 Last data filed at 10/24/2020 0600 Gross per 24 hour  Intake 1935.31 ml  Output 2170 ml  Net -234.69 ml   Filed Weights   10/10/2020 0100 10/25/20 0340 10/26/20 0500  Weight: 59.3 kg 59.3 kg 56.1 kg    Physical Exam General: chronically ill appearing thin man HEENT: OP clear, dry,  Neuro: awake, interacting w daughter and following commands CV: regular, distant,  PULM: decreased B BS, L chest tube without air leak  GI: non-distended, hypoactive BS, diffuse tender to palp, JP drain in place Extremities: no edema  Skin: no rash   Resolved Hospital Problems:  Hypernatremia  Assessment & Plan:   Complex clinical course: admitted with R MCA CVA.   Acute respiratory failure secondary to aspiration pneumonia with bilateral pleural effusion, left klebsiella empyema, ? Connection with abdominal process Hemothorax following thoracentesis, s/p VATS 11/22. No active bleeding after clot removal S/p thora on 11/20 with exudative properties, klebsiella in pleural culture & RP abscess.  s/post thora extravasation on CTA chest  Plan: Supportive care, pulmonary hygiene Wean FiO2 as able  Mixed Shock - Septic + ABLA / Hypovolemic Multifactorial,  ABLA and septic. S/p 2 units blood on 11/21.  Klebsiella Empyema + fluid collection in abd.  Sepsis secondary to RLL PNA, Intra-abdominal Abscess Suspected aspiration and intraabdominal abscess (klebsiella and candida) in setting of bowel perforation and  now with left empyema.  Question whether there may be a connection between the intra-abdominal process in the pleural space Plan: Fluconazole 11/17, Zosyn restarted 7/19.  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 Superior-most edge lines up with distal end of cortrak. P:  Appreciate CCS assistance.  Planning for repeat CT abdomen 11/29.  Question whether he may need to go to the OR Continue fluconazole, Zosyn as above  AKI  Plan: Follow urine output, BMP  PAF with RVR Rate controlled Telemetry monitoring On heparin infusion  Acute R MCA CVA s/p thrombectomy and right MCA stent placement by IR with TICI 3 Left hemiplegia Dysphagia Plan: ASA Tolerating heparin  Severe Protein Calorie Malnutrition On TPN   Best practice:  Diet: bowel rest/ NPO. Continue TPN Pain/Anxiety/Delirium protocol (if indicated):  Off  VAP protocol (if indicated): Oral care protocol DVT prophylaxis: SCDs for now GI prophylaxis: PPI  Glucose control: SSI Mobility: bed rest Code Status: full code Family Communication:  Disposition:  ICU  LABS    PULMONARY No results for input(s): PHART, PCO2ART, PO2ART, HCO3, TCO2, O2SAT in the last 168 hours.  Invalid input(s): PCO2, PO2 CBC Recent Labs  Lab 10/28/20 0444 10/28/20 1537 10/26/2020 0529  HGB 8.1* 8.0* 8.2*  HCT 24.6* 24.7* 26.0*  WBC 10.2 9.6 12.5*  PLT 230 268 303   COAGULATION No results for input(s): INR in the last 168 hours. CARDIAC  No results for input(s): TROPONINI in the last 168 hours. No results for input(s): PROBNP in the last 168 hours.  CHEMISTRY Recent Labs  Lab 10/25/20 0213 10/25/20 0213 10/25/20 0741 10/25/20 0741 10/26/20 0500 10/26/20 0500 10/27/20 0443 10/27/20 0443 10/27/20 0620 10/27/20 0620 10/28/20 0629 10/12/2020 0529  NA 139   < > 142   < > 144  --  143  --  148*  --   149* 147*  K 4.1   < > 3.5   < > 2.8*   < > 3.8   < > 2.9*   < > 3.8 4.0  CL 105   < > 109   < > 106  --  104  --  109  --  112* 111  CO2 21*   < > 21*   < > 24  --  26  --  28  --  26 24  GLUCOSE 563*   < > 130*   < > 165*  --  433*  --  142*  --  123* 175*  BUN 72*   < > 77*   < > 80*  --  66*  --  66*  --  62* 56*  CREATININE 2.56*   < > 2.67*   < > 2.49*  --  1.83*  --  1.78*  --  1.69* 1.46*  CALCIUM 7.6*   < > 7.5*   < > 7.8*  --  7.7*  --  7.9*  --  8.0* 7.8*  MG 2.3  --  2.1  --  2.0  --   --   --   --   --  1.9 2.5*  PHOS 2.2*  --  2.4*  --  2.9  --  3.0  --   --   --   --  2.9   < > = values in this interval not displayed.   Estimated Creatinine Clearance: 40 mL/min (A) (by C-G formula based on SCr of 1.46 mg/dL (H)).  LIVER Recent Labs  Lab 10/25/20 0213 10/25/20 0741 10/26/20 0500 2020/11/10 0529  AST 126* 103* 67* 89*  ALT 201* 184* 128* 124*  ALKPHOS 74 83 90 206*  BILITOT 1.7* 2.1* 2.1* 1.3*  PROT 4.6* 5.1* 5.3* 5.9*  ALBUMIN 1.1* 1.1* 1.1* 1.4*   INFECTIOUS No results for input(s): LATICACIDVEN, PROCALCITON in the last 168 hours. ENDOCRINE CBG (last 3)  Recent Labs    10/28/20 2343 11/10/20 0338 11/10/20 0754  GLUCAP 112* 158* 108*    Independent CC time 33 minutes  Levy Pupa, MD, PhD 2020/11/10, 11:13 AM [facility] [phone] or if no answer [phone]

## 2020-10-29 NOTE — Brief Op Note (Signed)
Large amount of dark red and red blood in gastric remnant and one of the jejunal limbs preventing visualization of a source. Aggressive volume resuscitation and if stabilizes enough then would proceed with updated CT that surgery requested. See EGD procedure note for complete findings/recs. Discussed in detail with daughter. Updated Dr. Violet Baldy of CCM and Dr. Dossie Der of CCS.

## 2020-10-29 NOTE — Code Documentation (Signed)
PATIENT NAME: Mark May MEDICAL RECORD NUMBER: [id number] Birthday: [birth year]  Age: 64 y.o. Admit Date: 10/09/2020  Provider: Levy Pupa, Posey Boyer  Indication: VT >> asystole  Technical Description:   CPR performance duration: 20 min  Was defibrillation or cardioversion used ? yes  Was external pacer placed ? no  Was patient intubated pre/post CPR ? yes  Was transvenous pacer placed ? no  Medications Administered Include      Yes/no  Amiodarone no  Atropin no  Calcium no  Epinephrine Yes x 5  Lidocaine no  Magnesium no  Norepinephrine Gtt starting  Phenylephrine no  Sodium bicarbonate 1 amp  Vasopression no   Evaluation  Final Status - Was patient successfully resuscitated ? yes  If successfully resuscitated - what is current rhythm ? Sinus tachycardia If successfully resuscitated - what is current hemodynamic status low-normal  Miscellaneous Information Patient with some increased tachycardia, sinus tach, following OT this afternoon, also associated with introduction of his first bolus of enteral contrast via gastric tube.  After second bolus of contrast given some initial improvement in heart rate, then found to be in regular wide-complex tachycardia, likely VT.  Cardioversion attempted, subsequent rhythm PEA or asystole.  CPR initiated and carried out for 20 minutes as above.  ET intubated, see separate procedure note.  Post CPR ABG with pH of 6.9, consistent with a severe metabolic acidosis, PO2 150.  Other labs pending.  Chest x-ray pending.  Norepinephrine initiated, bicarbonate drip initiated.  Etiology of acute decline not entirely clear, suspect either acute blood loss, acute change in his known intra-abdominal process.  Not currently stable Trelegy CT, will try to proceed when able.   Independent critical care time 45 minutes.   Les Pou Tyrik Stetzer 12/20/214:36 PM

## 2020-10-29 NOTE — Progress Notes (Signed)
ANTICOAGULATION CONSULT NOTE  Pharmacy Consult for Heparin Indication: Afib, s/p CVA  No Known Allergies  Patient Measurements: Weight: 56.1 kg (123 lb 10.9 oz) Heparin Dosing Weight:  56.7 kg  Vital Signs: Temp: 97.9 F (36.6 C) (11/29 0354) Temp Source: Axillary (11/29 0354) BP: 169/86 (11/29 0500) Pulse Rate: 85 (11/29 0500)  Labs: Recent Labs    10/27/20 0620 10/27/20 5631 10/28/20 0444 10/28/20 0444 10/28/20 0629 10/28/20 1330 10/28/20 1537 10/20/2020 0528 10/14/2020 0529  HGB 8.7*   < > 8.1*   < >  --   --  8.0*  --  8.2*  HCT 26.7*   < > 24.6*  --   --   --  24.7*  --  26.0*  PLT 229   < > 230  --   --   --  268  --  303  HEPARINUNFRC  --    < > 0.35  --   --  0.22*  --  0.34  --   CREATININE 1.78*  --   --   --  1.69*  --   --   --  1.46*   < > = values in this interval not displayed.    Estimated Creatinine Clearance: 40 mL/min (A) (by C-G formula based on SCr of 1.46 mg/dL (H)).  Assessment: [name] who presented on 11/5 with R MCA stroke s/p IR + stent placement for reocclusion. Noted to have new onset Afib and pharmacy was consulted to dose heparin. Patient with concern for GIB and L hemothorax now s/p VATS procedure originally holding heparin now pharmacy has been consulted to resume. Aiming for lower goal of 0.3-0.5 d/t R MCA stroke and concern for GIB.  Heparin level therapeutic at 0.34 on 1700 units/hr. Hgb stable in 8's. Platelets stable. Bloody drainage from JP drain noted 11/28. Per discussion with RN, drainage continues with some darker brown fluid without acute change from yesterday.    Goal of Therapy:  Heparin level 0.3 - 0.5 units/ml Monitor platelets by anticoagulation protocol: Yes    Plan:  Continue heparin drip at 1700 units/hr Follow-up daily HL and CBC  Kinnie Feil, PharmD PGY1 Acute Care Pharmacy Resident 10/23/2020 7:17 AM  Please check [email or web address] for unit specific pharmacy phone numbers.

## 2020-10-29 NOTE — Progress Notes (Signed)
Chaplain responded to referral from [employer] who reported pt's daughter had been distraught earlier.  Chaplain found daughter and son at pt bedside.  Both denied needing spiritual care at this time.  Chaplain advised they could call at any time if they have a need.  Vernell Morgans Staff Chaplain

## 2020-10-29 NOTE — Progress Notes (Signed)
eLink Physician-Brief Progress Note Patient Name: Mark May DOB: 1956-05-11 MRN: [id number]   Date of Service  10/03/2020  HPI/Events of Note  Notified of hypotension 67/45. Max on Levo, vaso at 0.03. Ongoing 1 or 2 units of blood. Bicarb drip at 125 cc/hr  eICU Interventions  Increased vaso to 0.04, fast drip blood transfusion, get ABG with lytes  BP 101/65  HR 105 when I left room eLink to be notified once with ABG result BMP to be drawn an hour earlier thatn previously ordered     Intervention Category Major Interventions: Hypotension - evaluation and management  Darl Pikes 10/28/2020, 8:23 PM

## 2020-10-29 NOTE — Progress Notes (Addendum)
Referring Physician(s): Dr. Levon Hedger  Supervising Physician: Ruel Favors  Patient Status:  Mark May - In-pt  Chief Complaint: Follow up right perinephric abscess drain placed 10/14/20 in IR  Subjective: Patient in bed with eyes close. He did not open his eyes during my conversation with his daughter or during my assessment of his drain. He is ill-appearing but does not seem in any acute discomfort or distress.   Allergies: Patient has no known allergies.  Medications: Prior to Admission medications   Medication Sig Start Date End Date Taking? Authorizing Provider  allopurinol (ZYLOPRIM) 100 MG tablet Take 3 tablets (300 mg total) by mouth daily. Patient not taking: Reported on 10/09/2020 02/17/18   Reubin Milan, MD  amLODipine (NORVASC) 10 MG tablet Take 1 tablet (10 mg total) by mouth daily. Patient not taking: Reported on 10/09/2020 02/17/18   Reubin Milan, MD  tamsulosin [facility]) 0.4 MG CAPS capsule Take 1 capsule (0.4 mg total) by mouth daily after supper. Patient not taking: Reported on 10/09/2020 03/09/18   Reubin Milan, MD     Vital Signs: BP (!) 190/90   Pulse (!) 102   Temp 98.1 F (36.7 C)   Resp (!) 27   Wt 123 lb 10.9 oz (56.1 kg)   SpO2 97%   BMI 19.37 kg/m   Physical Exam Constitutional:      Appearance: He is ill-appearing.     Comments: Patient asleep   HENT:     Mouth/Throat:     Mouth: Mucous membranes are dry.  Cardiovascular:     Rate and Rhythm: Tachycardia present.  Pulmonary:     Effort: Pulmonary effort is normal.  Abdominal:     Comments: right perinephric drain to suction with maroon output in bulb.   Skin:    General: Skin is warm and dry.     Coloration: Skin is mottled.     Comments: Bilateral lower extremities.   Neurological:     Mental Status: He is disoriented.     Imaging: DG CHEST PORT 1 VIEW  Result Date: 10/28/2020 CLINICAL DATA:  Empyema. EXAM: PORTABLE CHEST 1 VIEW COMPARISON:  October 27, 2020.  FINDINGS: Stable cardiomediastinal silhouette. Left-sided chest tube is noted without pneumothorax. Right-sided PICC line is unchanged. Stable loculated left pleural effusion is noted. Stable small right pleural effusion is noted. Mild bibasilar atelectasis is noted. Bony thorax is unremarkable. IMPRESSION: Left-sided chest tube is noted without pneumothorax. Stable loculated left pleural effusion is noted. Stable small right pleural effusion is noted. Mild bibasilar atelectasis is noted. Electronically Signed   By: Lupita Raider M.D.   On: 10/28/2020 08:25   DG CHEST PORT 1 VIEW  Result Date: 10/27/2020 CLINICAL DATA:  Follow-up left pneumothorax EXAM: PORTABLE CHEST 1 VIEW COMPARISON:  10/24/2020 FINDINGS: Interval removal of the enteric and endotracheal tubes. Right PICC line and left chest tube are unchanged in position. Persistent left lateral pleural effusion or thickening with basilar atelectasis or infiltration. No visible residual pneumothorax. Small right pleural effusion. IMPRESSION: Interval removal of endotracheal and enteric tubes. Otherwise no significant change. No visible residual pneumothorax. Electronically Signed   By: Burman Nieves M.D.   On: 10/27/2020 05:12   DG Abd Portable 1V  Result Date: 10/08/2020 CLINICAL DATA:  Status post NG tube placement. EXAM: PORTABLE ABDOMEN - 1 VIEW COMPARISON:  Single-view of the abdomen 10/20/2020. FINDINGS: NG tube is in place with both the tip and side-port in the stomach. Pigtail catheter right lower  quadrant noted. IMPRESSION: NG tube in good position. Electronically Signed   By: Drusilla Kanner M.D.   On: 10/09/2020 12:52    Labs:  CBC: Recent Labs    10/27/20 0620 10/28/20 0444 10/28/20 1537 10/04/2020 0529  WBC 11.2* 10.2 9.6 12.5*  HGB 8.7* 8.1* 8.0* 8.2*  HCT 26.7* 24.6* 24.7* 26.0*  PLT 229 230 268 303    COAGS: Recent Labs    13-Oct-2020 1246 10/11/20 1706 10/21/20 0139 10/21/2020 0446  INR 0.9 1.1 1.7* 1.3*  APTT  25 41* 37*  --     BMP: Recent Labs    10/27/20 0443 10/27/20 0620 10/28/20 0629 10/10/2020 0529  NA 143 148* 149* 147*  K 3.8 2.9* 3.8 4.0  CL 104 109 112* 111  CO2 26 28 26 24   GLUCOSE 433* 142* 123* 175*  BUN 66* 66* 62* 56*  CALCIUM 7.7* 7.9* 8.0* 7.8*  CREATININE 1.83* 1.78* 1.69* 1.46*  GFRNONAA 40* 42* 44* 53*    LIVER FUNCTION TESTS: Recent Labs    10/25/20 0213 10/25/20 0741 10/26/20 0500 10/06/2020 0529  BILITOT 1.7* 2.1* 2.1* 1.3*  AST 126* 103* 67* 89*  ALT 201* 184* 128* 124*  ALKPHOS 74 83 90 206*  PROT 4.6* 5.1* 5.3* 5.9*  ALBUMIN 1.1* 1.1* 1.1* 1.4*    Assessment and Plan:  Right perinephric abscess s/p drain placement 10/14/20: Drain output is a dark maroon color, 70 ml of output documented in Epic with another 20 ml in JP bulb. WBC 12.5, Hgb 8.2. Surgical team has ordered CT scans to evaluate for possible surgical intervention.   Continue current drain management: flush drain each shift and document output. Change dressing daily or as needed.   Other plans per primary teams. IR will continue to follow.   Electronically Signed: 10/16/20, AGACNP-BC [phone] 10/24/2020, 2:09 PM   I spent a total of 15 Minutes at the the patient's bedside AND on the patient's [facility] floor or unit, greater than 50% of which was counseling/coordinating care for right perinephric drain care.

## 2020-10-29 NOTE — Progress Notes (Signed)
eLink Physician-Brief Progress Note Patient Name: Daire Okimoto DOB: 04/23/1956 MRN: [id number]   Date of Service  10/12/2020  HPI/Events of Note  SBP now 140s titrating down norepinephrine. ABG 7.33/26/170 on bicarb drip. BMP pending  eICU Interventions  For G scope tonight     Intervention Category Major Interventions: Other:  Darl Pikes 10/03/2020, 9:47 PM

## 2020-10-29 NOTE — Progress Notes (Signed)
   10/31/20 1600  Clinical Encounter Type  Visited With Patient;Family  Visit Type Code  Referral From Nurse  Consult/Referral To Chaplain  Spiritual Encounters  Spiritual Needs Emotional  The patient coded and medical team was present when the chaplain arrived. The chaplain was at bedside with the patient's family until the code was completed. The patient's family stated that a son was on the way to the hospital and was about thirty minutes away.  The chaplain will follow up as needed.

## 2020-10-29 NOTE — Progress Notes (Signed)
eLink Physician-Brief Progress Note Patient Name: Mark May DOB: 11-13-1956 MRN: [id number]   Date of Service  10/17/2020  HPI/Events of Note  Notified of calcium 6.4. corrects to 8.5 based on albumin level this morning. Several several units PRBC  eICU Interventions  Ordered calcium gluconate IVPB     Intervention Category Major Interventions: Electrolyte abnormality - evaluation and management  Darl Pikes 10/04/2020, 10:42 PM

## 2020-10-29 NOTE — Procedures (Signed)
Intubation Procedure Note  Mark May  [id number]  August 27, 1956  Date:10/07/2020  Time:4:51 PM   Provider Performing:Triston Skare S Stevenson Windmiller    Procedure: Intubation (31500)  Indication(s) Respiratory Failure  Consent Unable to obtain consent due to emergent nature of procedure.   Anesthesia None, performed during ACLS   Time Out Verified patient identification, verified procedure, site/side was marked, verified correct patient position, special equipment/implants available, medications/allergies/relevant history reviewed, required imaging and test results available.   Sterile Technique Usual hand hygeine, masks, and gloves were used   Procedure Description Patient positioned in bed supine. Patient was intubated with endotracheal tube using Glidescope. Blood from posterior pharynx and glottis suctioned to reveal good view. View was Grade 1 full glottis .  Number of attempts was 1.  Colorimetric CO2 detector was consistent with tracheal placement.   Complications/Tolerance None; patient tolerated the procedure well. Chest X-ray is ordered to verify placement.   Specimen(s) None   Levy Pupa, MD, PhD 10/14/2020, 4:53 PM [facility] [phone] or if no answer [phone]

## 2020-10-29 NOTE — H&P (View-Only) (Signed)
[facility] Progress Note  Mark May 64 y.o. 12/08/1955   Subjective: Dark red blood from NG tube. S/P cardiac arrest today. Intubated.  Objective: Vital signs: Vitals:   10/18/2020 1932 10/22/2020 1954  BP:    Pulse: 99   Resp: (!) 42   Temp:  97.9 F (36.6 C)  SpO2: 100%   BP 102/55  Physical Exam: Gen: intubated  CV: Tachycardic Chest: Coarse breath sounds Abd: distended, no facial grimace to palpation, +BS Ext: no edema  Lab Results: Recent Labs    10/27/20 0443 10/27/20 0620 10/10/2020 0529 10/24/2020 0529 10/06/2020 1628 10/20/2020 1628 10/04/2020 1737 10/15/2020 2100  NA 143   < > 147*   < > 147*   < > 149* 145  K 3.8   < > 4.0   < > 7.1*   < > 4.5 5.1  CL 104   < > 111   < > 114*  --   --  101  CO2 26   < > 24   < > 12*  --   --  15*  GLUCOSE 433*   < > 175*   < > 236*  --   --  265*  BUN 66*   < > 56*   < > 60*  --   --  63*  CREATININE 1.83*   < > 1.46*   < > 1.94*  --   --  2.41*  CALCIUM 7.7*   < > 7.8*   < > 8.2*  --   --  6.4*  MG  --    < > 2.5*  --  3.4*  --   --   --   PHOS 3.0  --  2.9  --   --   --   --   --    < > = values in this interval not displayed.   Recent Labs    10/10/2020 0529  AST 89*  ALT 124*  ALKPHOS 206*  BILITOT 1.3*  PROT 5.9*  ALBUMIN 1.4*   Recent Labs    10/28/20 1537 10/28/20 1537 10/14/2020 0529 10/28/2020 0529 10/04/2020 1735 10/04/2020 1735 10/19/2020 1737 10/19/2020 2100  WBC 9.6   < > 12.5*   < > 37.6*  --   --  41.8*  NEUTROABS 7.6  --  10.0*  --   --   --   --   --   HGB 8.0*   < > 8.2*   < > 4.1*   < > 5.4* 7.0*  HCT 24.7*   < > 26.0*   < > 14.1*   < > 16.0* 23.0*  MCV 86.7   < > 87.5   < > 97.9  --   --  95.0  PLT 268   < > 303   < > 212  --   --  227   < > = values in this interval not displayed.      Assessment/Plan: GI bleed - question ulcer bleed and intra-abdominal abscess with contained bowel perforation. Emergent EGD at bedside to assess for gastric ulcer or duodenal ulcer bleed. Protonix drip.  Volume resuscitation. Discussed risks/benefits with daughter and informed consent obtained.   Mark May C Mark May 10/13/2020, 10:29 PM  Questions please call [phone] 

## 2020-10-29 NOTE — Progress Notes (Signed)
Critical ABG results given to Dr Delton Coombes. No vent changes at this time.

## 2020-10-29 NOTE — Progress Notes (Signed)
RN stopped TNA @ 1635 when  code blue initiated so that the central line lumens could be used for IV pressors. At 1800- no central le lumen open to hang TNA - Dr Delton Coombes gave order to D/C TNA

## 2020-10-29 NOTE — Progress Notes (Addendum)
[facility] DAILY ICU PROGRESS NOTE                   [address]            [place] [id number]          [phone]   7 Days Post-Op Procedure(s) (LRB): VIDEO ASSISTED THORACOSCOPY (VATS)/DECORTICATION (Left)  Total Length of Stay:  LOS: 24 days   Subjective: Patient awake this am. When asked if pain at chest tube site, he nodded yes  Objective: Vital signs in last 24 hours: Temp:  [97 F (36.1 C)-98.8 F (37.1 C)] 97.9 F (36.6 C) (11/29 0354) Pulse Rate:  [79-91] 85 (11/29 0500) Cardiac Rhythm: Normal sinus rhythm (11/28 2000) Resp:  [25-37] 30 (11/29 0500) BP: (141-182)/(77-93) 169/86 (11/29 0500) SpO2:  [97 %-100 %] 99 % (11/29 0500)  Filed Weights   10/10/2020 0100 10/25/20 0340 10/26/20 0500  Weight: 59.3 kg 59.3 kg 56.1 kg      Intake/Output from previous day: 11/28 0701 - 11/29 0700 In: 2417 [I.V.:2216.9; IV Piggyback:200.1] Out: 1360 [Urine:1300; Drains:30; Chest Tube:30]  Intake/Output this shift: No intake/output data recorded.  Current Meds: Scheduled Meds: .  stroke: mapping our early stages of recovery book   Does not apply Once  . aspirin  300 mg Rectal Daily  . Chlorhexidine Gluconate Cloth  6 each Topical Daily  . insulin aspart  3-9 Units Subcutaneous Q4H  . mouth rinse  15 mL Mouth Rinse BID  . pantoprazole (PROTONIX) IV  40 mg Intravenous Q12H  . sodium chloride flush  10-40 mL Intracatheter Q12H  . sodium chloride flush  5 mL Intracatheter Q8H   Continuous Infusions: . sodium chloride Stopped (10/03/2020 1300)  . fluconazole (DIFLUCAN) IV 200 mg (10/28/20 1352)  . heparin 1,700 Units/hr (11/22/2020 0500)  . piperacillin-tazobactam (ZOSYN)  IV 3.375 g (10/28/20 2211)  . TPN ADULT (ION) 90 mL/hr at 11/02/2020 0500   PRN Meds:.sodium chloride, acetaminophen, dextrose, sodium chloride flush  General appearance: no distress Heart: RRR Lungs: Rhonchi Abdomen: Soft,  sporadic bowel sounds Extremities: SCDs in place Wound: Left chest  tube dressing is clean and dry Chest tube: To suction, no air leak  Lab Results: CBC: Recent Labs    10/28/20 1537 11/05/2020 0529  WBC 9.6 12.5*  HGB 8.0* 8.2*  HCT 24.7* 26.0*  PLT 268 303   BMET:  Recent Labs    10/28/20 0629 November 04, 2020 0529  NA 149* 147*  K 3.8 4.0  CL 112* 111  CO2 26 24  GLUCOSE 123* 175*  BUN 62* 56*  CREATININE 1.69* 1.46*  CALCIUM 8.0* 7.8*    CMET: Lab Results  Component Value Date   WBC 12.5 (H) 11/03/2020   HGB 8.2 (L) 04-Nov-2020   HCT 26.0 (L) 11/24/2020   PLT 303 11/13/2020   GLUCOSE 175 (H) 11/26/2020   CHOL 173 10/06/2020   TRIG 109 11/19/2020   HDL 33 (L) 10/06/2020   LDLCALC 85 10/06/2020   ALT 124 (H) 11/03/2020   AST 89 (H) Nov 04, 2020   NA 147 (H) 11/03/2020   K 4.0 11/19/2020   CL 111 11/02/2020   CREATININE 1.46 (H) 11/18/2020   BUN 56 (H) Nov 04, 2020   CO2 24 11/30/2020   INR 1.3 (H) 10/17/2020   HGBA1C 5.8 (H) 10/06/2020     PT/INR:  No results for input(s): LABPROT, INR in the last 72 hours. Radiology: No results found.  Assessment/Plan: S/P Procedure(s) (LRB): VIDEO ASSISTED THORACOSCOPY (VATS)/DECORTICATION (  Left) 1. CV-Previous PAF with RVR. SR this am. 2. Pulmonary-Acute respiratory failure, likely secondary to aspiration PNA. Extubated 11/26. Chest tube with 40 cc since mark yesterday around noon. CXR this am appears stable. Will remove chest tube.Check CXR in am. 3. GI-s/p CT guided drainage of right retroperitoneal abscess (per general surgery, duodenum is most likely site of perforation)  11/14. NPO, TPN. 4. ID-on Zosyn for RRP abscess, Klebsiella left pleural fluid 5. AKI-Creatinine decreased to 1.46.  6. CVA-s/p thrombectomy and R MCA stent;left hemiplegia  Donielle Margaretann Loveless PA-C 10/15/2020 7:02 AM   Agree with above Chest tube removal today  Lasean Rahming O Jona Erkkila

## 2020-10-29 NOTE — Progress Notes (Signed)
7 Days Post-Op  Subjective: Daughter at bedside.  Patient has been more agitated overnight.  He is complaining of more abdominal pain today apparently than he has previously along with hiccoughs and some pain going into his chest.  Denies nausea as best as I can tell.    ROS: See above, otherwise other systems negative  Objective: Vital signs in last 24 hours: Temp:  [97 F (36.1 C)-98.8 F (37.1 C)] 98.1 F (36.7 C) (11/29 0700) Pulse Rate:  [79-102] 102 (11/29 0700) Resp:  [22-37] 27 (11/29 0700) BP: (141-190)/(77-112) 190/90 (11/29 0700) SpO2:  [97 %-99 %] 97 % (11/29 0700) Last BM Date: 10/28/20  Intake/Output from previous day: 11/28 0701 - 11/29 0700 In: 2509.5 [I.V.:2309.4; IV Piggyback:200.1] Out: 2350 [Urine:2250; Drains:70; Chest Tube:30] Intake/Output this shift: No intake/output data recorded.  PE: Abd: soft, but with some diffuse tenderness especially in the upper abdomen, no peritonitis or guarding.  jp drain with thick, brown drainage with a small amount of relatively fresh blood present. 70cc noted yesterday.  Lab Results:  Recent Labs    10/28/20 1537 10/19/2020 0529  WBC 9.6 12.5*  HGB 8.0* 8.2*  HCT 24.7* 26.0*  PLT 268 303   BMET Recent Labs    10/28/20 0629 10/11/2020 0529  NA 149* 147*  K 3.8 4.0  CL 112* 111  CO2 26 24  GLUCOSE 123* 175*  BUN 62* 56*  CREATININE 1.69* 1.46*  CALCIUM 8.0* 7.8*   PT/INR No results for input(s): LABPROT, INR in the last 72 hours. CMP     Component Value Date/Time   NA 147 (H) 10/16/2020 0529   NA 140 03/09/2018 1620   NA 136 01/06/2015 1448   K 4.0 10/16/2020 0529   K 3.6 01/06/2015 1448   CL 111 10/30/2020 0529   CL 99 01/06/2015 1448   CO2 24 10/16/2020 0529   CO2 28 01/06/2015 1448   GLUCOSE 175 (H) 10/04/2020 0529   GLUCOSE 154 (H) 01/06/2015 1448   BUN 56 (H) 10/18/2020 0529   BUN 12 03/09/2018 1620   BUN 12 01/06/2015 1448   CREATININE 1.46 (H) 10/20/2020 0529   CREATININE 0.88  01/06/2015 1448   CALCIUM 7.8 (L) 10/30/2020 0529   CALCIUM 8.7 01/06/2015 1448   PROT 5.9 (L) 10/18/2020 0529   PROT 7.4 03/09/2018 1620   PROT 8.0 01/06/2015 1448   ALBUMIN 1.4 (L) 10/06/2020 0529   ALBUMIN 4.8 03/09/2018 1620   ALBUMIN 4.2 01/06/2015 1448   AST 89 (H) 10/25/2020 0529   AST 32 01/06/2015 1448   ALT 124 (H) 10/11/2020 0529   ALT 44 01/06/2015 1448   ALKPHOS 206 (H) 10/04/2020 0529   ALKPHOS 123 (H) 01/06/2015 1448   BILITOT 1.3 (H) 10/10/2020 0529   BILITOT 0.6 03/09/2018 1620   BILITOT 0.3 01/06/2015 1448   GFRNONAA 53 (L) 10/26/2020 0529   GFRNONAA >60 01/06/2015 1448   GFRAA 113 03/09/2018 1620   GFRAA >60 01/06/2015 1448   Lipase  No results found for: LIPASE     Studies/Results: No results found.  Anti-infectives: Anti-infectives (From admission, onward)   Start     Dose/Rate Route Frequency Ordered Stop   2020/10/31 1400  fluconazole (DIFLUCAN) IVPB 200 mg        200 mg 100 mL/hr over 60 Minutes Intravenous Every 24 hours 10-31-2020 1148     10/19/20 1400  piperacillin-tazobactam (ZOSYN) IVPB 3.375 g        3.375 g 12.5 mL/hr over  240 Minutes Intravenous Every 8 hours 10/19/20 1150     10/19/20 0200  vancomycin (VANCOREADY) IVPB 500 mg/100 mL  Status:  Discontinued        500 mg 100 mL/hr over 60 Minutes Intravenous Every 12 hours 10/19/20 0025 10/19/20 0951   10/19/20 0200  metroNIDAZOLE (FLAGYL) IVPB 500 mg  Status:  Discontinued        500 mg 100 mL/hr over 60 Minutes Intravenous Every 8 hours 10/19/20 0045 10/19/20 1150   10/19/20 0115  ceFEPIme (MAXIPIME) 2 g in sodium chloride 0.9 % 100 mL IVPB  Status:  Discontinued        2 g 200 mL/hr over 30 Minutes Intravenous Every 12 hours 10/19/20 0025 10/19/20 1150   10/17/20 1400  Ampicillin-Sulbactam (UNASYN) 3 g in sodium chloride 0.9 % 100 mL IVPB  Status:  Discontinued        3 g 200 mL/hr over 30 Minutes Intravenous Every 6 hours 10/17/20 1207 10/18/20 2325   10/17/20 1400  fluconazole  (DIFLUCAN) IVPB 400 mg  Status:  Discontinued        400 mg 100 mL/hr over 120 Minutes Intravenous Every 24 hours 10/17/20 1207 10/21/2020 1148   10/15/20 1630  vancomycin (VANCOREADY) IVPB 500 mg/100 mL  Status:  Discontinued        500 mg 100 mL/hr over 60 Minutes Intravenous Every 12 hours 10/15/20 0940 10/17/20 1205   10/14/20 0400  vancomycin (VANCOREADY) IVPB 500 mg/100 mL  Status:  Discontinued        500 mg 100 mL/hr over 60 Minutes Intravenous Every 24 hours 10/13/20 0233 10/15/20 0940   10/13/20 0330  vancomycin (VANCOREADY) IVPB 750 mg/150 mL        750 mg 150 mL/hr over 60 Minutes Intravenous  Once 10/13/20 0233 10/13/20 0439   10/12/20 1800  ceFEPIme (MAXIPIME) 2 g in sodium chloride 0.9 % 100 mL IVPB  Status:  Discontinued        2 g 200 mL/hr over 30 Minutes Intravenous Every 24 hours 10/11/20 1718 10/11/20 1742   10/12/20 1800  vancomycin (VANCOREADY) IVPB 750 mg/150 mL  Status:  Discontinued        750 mg 150 mL/hr over 60 Minutes Intravenous Every 24 hours 10/11/20 1724 10/11/20 1758   10/12/20 0200  metroNIDAZOLE (FLAGYL) tablet 500 mg  Status:  Discontinued        500 mg Oral Every 8 hours 10/11/20 1715 10/11/20 1758   10/12/20 0030  piperacillin-tazobactam (ZOSYN) IVPB 3.375 g  Status:  Discontinued        3.375 g 12.5 mL/hr over 240 Minutes Intravenous Every 8 hours 10/11/20 1805 10/17/20 1205   10/11/20 1830  piperacillin-tazobactam (ZOSYN) IVPB 3.375 g        3.375 g 100 mL/hr over 30 Minutes Intravenous  Once 10/11/20 1805 10/11/20 1839   10/11/20 1715  ceFEPIme (MAXIPIME) 2 g in sodium chloride 0.9 % 100 mL IVPB  Status:  Discontinued        2 g 200 mL/hr over 30 Minutes Intravenous STAT 10/11/20 1710 10/11/20 1742   10/11/20 1715  metroNIDAZOLE (FLAGYL) IVPB 500 mg  Status:  Discontinued        500 mg 100 mL/hr over 60 Minutes Intravenous STAT 10/11/20 1710 10/11/20 1807   10/11/20 1715  vancomycin (VANCOCIN) IVPB 1000 mg/200 mL premix  Status:  Discontinued         1,000 mg 200 mL/hr over 60 Minutes Intravenous STAT 10/11/20 1710  10/11/20 1804   10/18/2020 1541  ceFAZolin (ANCEF) 2-4 GM/100ML-% IVPB       Note to Pharmacy: [facility]   : cabinet override      10/21/2020 1541 10/06/20 0344       Assessment/Plan RLL PNA Metabolic encephalopathy  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  It is unclear -given increase in pain and WBC, will replace NGT so he can get oral contrast and repeat a CT scan today to further assess the abdomen. - recommend STRICT NPO and TPN  NewLhemothorax - S/p VATS 11/22 Dr. Cliffton Asters, per [facility]  FEN: NPO, IVF, PICC and TPN  VTE: SCDs,ASA suppository, heparin gtt, hgb stable ID: current abx are zosyn and diflucan   LOS: 24 days    Letha Cape , [facility] 10/28/2020, 8:11 AM Please see Amion for pager number during day hours 7:00am-4:30pm or 7:00am -11:30am on weekends

## 2020-10-29 NOTE — Progress Notes (Signed)
PHARMACY - TOTAL PARENTERAL NUTRITION CONSULT NOTE  Indication: Perforated duodenum  Patient Measurements: Weight: 56.1 kg (123 lb 10.9 oz)   Body mass index is 19.37 kg/m. Usual Weight: 120-125 lbs  Assessment:  64 YOM presented on 10/07/2020 with right MCA occlusion and underwent revascularization. PMH significant for PUD post partial gastrectomy in 1990. Patient was started on a dysphagia diet on 10/08/20 and also transferred out of the ICU. Post-op course complicated by Afib RVR, aspiration PNA/cellulitis and AKI. He was then started on TF on 10/12/20, which ended on 10/13/20 when CT showed perinephric abscess with gas formation. Drain was placed and there is concern for fistulous communication to the colon on 10/16/20 CT. Nutrition has been inadequate since admission, and Pharmacy consulted for TPN management. Per patient's son, patient has no recent weight loss and was eating a balanced diet PTA.  Glucose / Insulin: no hx DM - CBGs 120-170s Utilized 18 units aspart (dec) + 25 units regular insulin in bag. Electrolytes: K 4 (goal >4 with Afib), no lasix planned, Mag 2.5 (goal >2 with Afib), Na 149>147, Phos 2.9 Renal: SCr trend down to 1.46 LFTs / TGs:  AST/ALT downtrend 89/124, Tbili 2.1>1.3, TG wnl Prealbumin / albumin: albumin 1.1>1.4, prealbumin up 15.3c Intake / Output; MIVF: UOP 1.7>1 ml/kg/hr, drain output stable 34mL, chest tube output down to 30 mL, NGT removed GI Imaging: 11/20 CT - large IA fluid and blood in LUQ worrisome for active bleeding, renal cysts, cannot exclude fistulous communication with large bowel  NGT to be placed for CT w/contrast 11/29  Surgeries / Procedures: 11/20 - intubated, thoracentesis -- drained cloudy, yellow appearing fluid 11/22 - VATS/decortication  Central access: PICC 10/17/20 TPN start date: 10/17/20  Nutritional Goals (per RD rec on 11/23): 1950 - 2240 kCal, 115 - 130g protein; >1.8L fluid per day  Goal TPN rate is 90 ml/hr - will  provide 122g protein, 292g CHO, and 67g SMOF lipids, for total 2150 kCal  Current Nutrition:  TPN; NPO  Plan:  Continue TPN at goal rate 90 mL/hr at 1800 Electrolytes in TPN: Dec Na again, cont other lytes from previous,1:2 LO:[id number] Continue standard MVI and trace elements to TPN Continue ICU resistant scale SSI + cont 25 units regular insulin to TPN bag and adjust as needed F/u GI imaging  BMP + Mg in AM  Daylene Posey, PharmD Clinical Pharmacist Please check AMION for all [facility] Pharmacy numbers 10/16/2020 7:46 AM

## 2020-10-29 NOTE — Code Documentation (Signed)
  Patient Name: Mark May   MRN: [id number]   Date of Birth/ Sex: Jul 31, 1956 , male      Admission Date: 10/04/2020  Attending Provider: Leslye Peer, MD  Primary Diagnosis: Middle cerebral artery embolism, right   Indication: Pt was in his usual state of health until this PM, when he was noted to be V-Tach. Code blue was subsequently called. At the time of arrival on scene, ACLS protocol was underway.   Technical Description:  - CPR performance duration:  12 minutes  - Was defibrillation or cardioversion used? Yes   - Was external pacer placed? No  - Was patient intubated pre/post CPR? Yes   Medications Administered: Y = Yes; Blank = No Amiodarone    Atropine  Y  Calcium    Epinephrine  Y  Lidocaine    Magnesium    Norepinephrine    Phenylephrine    Sodium bicarbonate  Y  Vasopressin     Post CPR evaluation:  - Final Status - Was patient successfully resuscitated ? Yes - What is current rhythm? Sinus Tachycardia - What is current hemodynamic status? Stable  Miscellaneous Information:  - Labs sent, including: ABG, Heparin, Mg, BMP, CBC  - Primary team notified?  Yes  - Family Notified? Yes  - Additional notes/ transfer status:      Jovita Kussmaul, MD  11/27/2020, 4:37 PM

## 2020-10-29 NOTE — Progress Notes (Signed)
[facility] Gastroenterology Progress Note  Mark May 64 y.o. 1956-08-22   Subjective: Dark red blood from NG tube. S/P cardiac arrest today. Intubated.  Objective: Vital signs: Vitals:   10/24/2020 1932 10/23/2020 1954  BP:    Pulse: 99   Resp: (!) 42   Temp:  97.9 F (36.6 C)  SpO2: 100%   BP 102/55  Physical Exam: Gen: intubated  CV: Tachycardic Chest: Coarse breath sounds Abd: distended, no facial grimace to palpation, +BS Ext: no edema  Lab Results: Recent Labs    10/27/20 0443 10/27/20 0620 10/06/2020 0529 10/04/2020 0529 10/27/2020 1628 10/06/2020 1628 10/21/2020 1737 10/20/2020 2100  NA 143   < > 147*   < > 147*   < > 149* 145  K 3.8   < > 4.0   < > 7.1*   < > 4.5 5.1  CL 104   < > 111   < > 114*  --   --  101  CO2 26   < > 24   < > 12*  --   --  15*  GLUCOSE 433*   < > 175*   < > 236*  --   --  265*  BUN 66*   < > 56*   < > 60*  --   --  63*  CREATININE 1.83*   < > 1.46*   < > 1.94*  --   --  2.41*  CALCIUM 7.7*   < > 7.8*   < > 8.2*  --   --  6.4*  MG  --    < > 2.5*  --  3.4*  --   --   --   PHOS 3.0  --  2.9  --   --   --   --   --    < > = values in this interval not displayed.   Recent Labs    10/01/2020 0529  AST 89*  ALT 124*  ALKPHOS 206*  BILITOT 1.3*  PROT 5.9*  ALBUMIN 1.4*   Recent Labs    10/28/20 1537 10/28/20 1537 10/19/2020 0529 10/08/2020 0529 10/05/2020 1735 10/15/2020 1735 10/23/2020 1737 10/17/2020 2100  WBC 9.6   < > 12.5*   < > 37.6*  --   --  41.8*  NEUTROABS 7.6  --  10.0*  --   --   --   --   --   HGB 8.0*   < > 8.2*   < > 4.1*   < > 5.4* 7.0*  HCT 24.7*   < > 26.0*   < > 14.1*   < > 16.0* 23.0*  MCV 86.7   < > 87.5   < > 97.9  --   --  95.0  PLT 268   < > 303   < > 212  --   --  227   < > = values in this interval not displayed.      Assessment/Plan: GI bleed - question ulcer bleed and intra-abdominal abscess with contained bowel perforation. Emergent EGD at bedside to assess for gastric ulcer or duodenal ulcer bleed. Protonix drip.  Volume resuscitation. Discussed risks/benefits with daughter and informed consent obtained.   Shirley Friar 10/12/2020, 10:29 PM  Questions please call [phone]

## 2020-10-29 NOTE — Interval H&P Note (Signed)
History and Physical Interval Note:  November 16, 2020 10:33 PM  Mark May  has presented today for surgery, with the diagnosis of gi bleed.  The various methods of treatment have been discussed with the patient and family. After consideration of risks, benefits and other options for treatment, the patient has consented to  Procedure(s): ESOPHAGOGASTRODUODENOSCOPY (EGD) (N/A) as a surgical intervention.  The patient's history has been reviewed, patient examined, no change in status, stable for surgery.  I have reviewed the patient's chart and labs.  Questions were answered to the patient's satisfaction.     Shirley Friar

## 2020-10-30 ENCOUNTER — Inpatient Hospital Stay (HOSPITAL_COMMUNITY): Payer: 59

## 2020-10-30 ENCOUNTER — Encounter (HOSPITAL_COMMUNITY): Payer: Self-pay | Admitting: Gastroenterology

## 2020-10-30 DIAGNOSIS — K922 Gastrointestinal hemorrhage, unspecified: Secondary | ICD-10-CM

## 2020-10-30 DIAGNOSIS — R58 Hemorrhage, not elsewhere classified: Secondary | ICD-10-CM

## 2020-10-30 LAB — BASIC METABOLIC PANEL
Anion gap: 22 — ABNORMAL HIGH (ref 5–15)
Anion gap: 26 — ABNORMAL HIGH (ref 5–15)
Anion gap: 26 — ABNORMAL HIGH (ref 5–15)
BUN: 68 mg/dL — ABNORMAL HIGH (ref 8–23)
BUN: 75 mg/dL — ABNORMAL HIGH (ref 8–23)
BUN: 87 mg/dL — ABNORMAL HIGH (ref 8–23)
CO2: 20 mmol/L — ABNORMAL LOW (ref 22–32)
CO2: 21 mmol/L — ABNORMAL LOW (ref 22–32)
CO2: 24 mmol/L (ref 22–32)
Calcium: 6.5 mg/dL — ABNORMAL LOW (ref 8.9–10.3)
Calcium: 6.5 mg/dL — ABNORMAL LOW (ref 8.9–10.3)
Calcium: 6.2 mg/dL — CL (ref 8.9–10.3)
Chloride: 99 mmol/L (ref 98–111)
Chloride: 96 mmol/L — ABNORMAL LOW (ref 98–111)
Chloride: 94 mmol/L — ABNORMAL LOW (ref 98–111)
Creatinine, Ser: 2.71 mg/dL — ABNORMAL HIGH (ref 0.61–1.24)
Creatinine, Ser: 3.04 mg/dL — ABNORMAL HIGH (ref 0.61–1.24)
Creatinine, Ser: 3.57 mg/dL — ABNORMAL HIGH (ref 0.61–1.24)
GFR, Estimated: 25 mL/min — ABNORMAL LOW (ref >60)
GFR, Estimated: 22 mL/min — ABNORMAL LOW (ref >60)
GFR, Estimated: 18 mL/min — ABNORMAL LOW (ref >60)
Glucose, Bld: 93 mg/dL (ref 70–99)
Glucose, Bld: 91 mg/dL (ref 70–99)
Glucose, Bld: 78 mg/dL (ref 70–99)
Potassium: 4.7 mmol/L (ref 3.5–5.1)
Potassium: 4.7 mmol/L (ref 3.5–5.1)
Potassium: 5 mmol/L (ref 3.5–5.1)
Sodium: 141 mmol/L (ref 135–145)
Sodium: 143 mmol/L (ref 135–145)
Sodium: 144 mmol/L (ref 135–145)

## 2020-10-30 LAB — CBC
HCT: 25.3 % — ABNORMAL LOW (ref 39.0–52.0)
HCT: 18.6 % — ABNORMAL LOW (ref 39.0–52.0)
HCT: 26.8 % — ABNORMAL LOW (ref 39.0–52.0)
Hemoglobin: 8.5 g/dL — ABNORMAL LOW (ref 13.0–17.0)
Hemoglobin: 6.3 g/dL — CL (ref 13.0–17.0)
Hemoglobin: 9.2 g/dL — ABNORMAL LOW (ref 13.0–17.0)
MCH: 29.6 pg (ref 26.0–34.0)
MCH: 29.3 pg (ref 26.0–34.0)
MCH: 28.6 pg (ref 26.0–34.0)
MCHC: 33.6 g/dL (ref 30.0–36.0)
MCHC: 33.9 g/dL (ref 30.0–36.0)
MCHC: 34.3 g/dL (ref 30.0–36.0)
MCV: 88.2 fL (ref 80.0–100.0)
MCV: 86.5 fL (ref 80.0–100.0)
MCV: 83.2 fL (ref 80.0–100.0)
Platelets: 209 10*3/uL (ref 150–400)
Platelets: 140 10*3/uL — ABNORMAL LOW (ref 150–400)
Platelets: 148 10*3/uL — ABNORMAL LOW (ref 150–400)
RBC: 2.87 MIL/uL — ABNORMAL LOW (ref 4.22–5.81)
RBC: 2.15 MIL/uL — ABNORMAL LOW (ref 4.22–5.81)
RBC: 3.22 MIL/uL — ABNORMAL LOW (ref 4.22–5.81)
RDW: 16.3 % — ABNORMAL HIGH (ref 11.5–15.5)
RDW: 16.2 % — ABNORMAL HIGH (ref 11.5–15.5)
RDW: 15.9 % — ABNORMAL HIGH (ref 11.5–15.5)
WBC: 42.5 10*3/uL — ABNORMAL HIGH (ref 4.0–10.5)
WBC: 28.5 10*3/uL — ABNORMAL HIGH (ref 4.0–10.5)
WBC: 23.6 10*3/uL — ABNORMAL HIGH (ref 4.0–10.5)
nRBC: 4.3 % — ABNORMAL HIGH (ref 0.0–0.2)
nRBC: 4.6 % — ABNORMAL HIGH (ref 0.0–0.2)
nRBC: 2.5 % — ABNORMAL HIGH (ref 0.0–0.2)

## 2020-10-30 LAB — PREPARE FRESH FROZEN PLASMA
Unit division: 0
Unit division: 0

## 2020-10-30 LAB — BPAM FFP
Blood Product Expiration Date: 202112032359
Blood Product Expiration Date: 202112032359
ISSUE DATE / TIME: 202111291849
ISSUE DATE / TIME: 202111291849
Unit Type and Rh: 7300
Unit Type and Rh: 7300

## 2020-10-30 LAB — GLUCOSE, CAPILLARY
Glucose-Capillary: 87 mg/dL (ref 70–99)
Glucose-Capillary: 71 mg/dL (ref 70–99)
Glucose-Capillary: 57 mg/dL — ABNORMAL LOW (ref 70–99)
Glucose-Capillary: 64 mg/dL — ABNORMAL LOW (ref 70–99)
Glucose-Capillary: 81 mg/dL (ref 70–99)

## 2020-10-30 LAB — FIBRINOGEN: Fibrinogen: 370 mg/dL (ref 210–475)

## 2020-10-30 LAB — CHOLESTEROL, BODY FLUID

## 2020-10-30 LAB — PATHOLOGIST SMEAR REVIEW

## 2020-10-30 LAB — PREPARE RBC (CROSSMATCH)

## 2020-10-30 LAB — LACTIC ACID, PLASMA: Lactic Acid, Venous: >11 mmol/L (ref 0.5–1.9)

## 2020-10-30 LAB — HEPARIN LEVEL (UNFRACTIONATED): Heparin Unfractionated: <0.1 IU/mL — ABNORMAL LOW (ref 0.30–0.70)

## 2020-10-30 LAB — MAGNESIUM: Magnesium: 1.9 mg/dL (ref 1.7–2.4)

## 2020-10-30 MED ORDER — SODIUM ZIRCONIUM CYCLOSILICATE 10 G PO PACK
10.0000 g | PACK | Freq: Once | ORAL | Status: AC
  Administered 2020-10-31: 10 g
  Filled 2020-10-30: qty 1

## 2020-10-30 MED ORDER — MAGNESIUM SULFATE 2 GM/50ML IV SOLN
2.0000 g | Freq: Once | INTRAVENOUS | Status: AC
  Administered 2020-10-30: 2 g via INTRAVENOUS
  Filled 2020-10-30: qty 50

## 2020-10-30 MED ORDER — CALCIUM GLUCONATE-NACL 1-0.675 GM/50ML-% IV SOLN
1.0000 g | Freq: Once | INTRAVENOUS | Status: AC
  Administered 2020-10-31: 1000 mg via INTRAVENOUS
  Filled 2020-10-30: qty 50

## 2020-10-30 MED ORDER — TRAVASOL 10 % IV SOLN
INTRAVENOUS | Status: AC
  Filled 2020-10-30: qty 1220.4

## 2020-10-30 NOTE — Progress Notes (Addendum)
[facility] DAILY ICU PROGRESS NOTE                   [address]            [place] [address]          [phone]   1 Day Post-Op Procedure(s) (LRB): ESOPHAGOGASTRODUODENOSCOPY (EGD) (N/A)  Total Length of Stay:  LOS: 25 days   Subjective: Events of yesterday (VT, asystole) and re intubation noted.  Objective: Vital signs in last 24 hours: Temp:  [97.8 F (36.6 C)-101.2 F (38.4 C)] 100.2 F (37.9 C) (11/30 0625) Pulse Rate:  [83-138] 104 (11/30 0630) Cardiac Rhythm: Sinus tachycardia (11/30 0320) Resp:  [8-49] 27 (11/30 0630) BP: (49-193)/(14-154) 175/154 (11/30 0630) SpO2:  [80 %-100 %] 100 % (11/30 0630) FiO2 (%):  [40 %-100 %] 40 % (11/30 0331) Weight:  [58.8 kg] 58.8 kg (11/30 0500)  Filed Weights   10/25/20 0340 10/26/20 0500 10/30/20 0500  Weight: 59.3 kg 56.1 kg 58.8 kg      Intake/Output from previous day: 11/29 0701 - 11/30 0700 In: 5848.1 [I.V.:2273.8; ZHYQM:5784; NG/GT:1000; IV Piggyback:820.3] Out: 1100 [Urine:905; Emesis/NG output:125; Drains:10; Chest Tube:60]  Intake/Output this shift: No intake/output data recorded.  Current Meds: Scheduled Meds: .  stroke: mapping our early stages of recovery book   Does not apply Once  . sodium chloride   Intravenous Once  . Chlorhexidine Gluconate Cloth  6 each Topical Daily  . docusate  100 mg Per Tube BID  . insulin aspart  3-9 Units Subcutaneous Q4H  . mouth rinse  15 mL Mouth Rinse BID  . [START ON 11/02/2020] pantoprazole  40 mg Intravenous Q12H  . polyethylene glycol  17 g Per Tube Daily  . sodium chloride flush  10-40 mL Intracatheter Q12H  . sodium chloride flush  5 mL Intracatheter Q8H   Continuous Infusions: . sodium chloride Stopped (10/14/2020 1300)  . fluconazole (DIFLUCAN) IV 200 mg (10/27/2020 1318)  . norepinephrine (LEVOPHED) Adult infusion 55 mcg/min (10/30/20 0439)  . pantoprozole (PROTONIX) infusion 8 mg/hr (10/07/2020 2157)  . piperacillin-tazobactam (ZOSYN)  IV 3.375 g  (10/30/20 0545)  . sodium bicarbonate (isotonic) 150 mEq in D5W 1000 mL infusion 125 mL/hr at 10/15/2020 1900  . vasopressin 0.04 Units/min (10/30/20 0210)   PRN Meds:.sodium chloride, acetaminophen, dextrose, fentaNYL (SUBLIMAZE) injection, fentaNYL (SUBLIMAZE) injection, midazolam, midazolam, sodium chloride flush   Heart: RRR Lungs: On vent, bilateral rhonchi Abdomen: Soft,  sporadic bowel sounds Extremities: SCDs in place Wound: Left chest tube dressing is clean and dry Chest tube: To suction, no air leak  Lab Results: CBC: Recent Labs    10/21/2020 2100 10/30/20 0339  WBC 41.8* 42.5*  HGB 7.0* 8.5*  HCT 23.0* 25.3*  PLT 227 209   BMET:  Recent Labs    10/06/2020 2100 10/30/20 0339  NA 145 141  K 5.1 4.7  CL 101 99  CO2 15* 20*  GLUCOSE 265* 93  BUN 63* 68*  CREATININE 2.41* 2.71*  CALCIUM 6.4* 6.5*    CMET: Lab Results  Component Value Date   WBC 42.5 (H) 10/30/2020   HGB 8.5 (L) 10/30/2020   HCT 25.3 (L) 10/30/2020   PLT 209 10/30/2020   GLUCOSE 93 10/30/2020   CHOL 173 10/06/2020   TRIG 109 10/06/2020   HDL 33 (L) 10/06/2020   LDLCALC 85 10/06/2020   ALT 124 (H) 10/23/2020   AST 89 (H) 10/12/2020   NA 141 10/30/2020   K 4.7  10/30/2020   CL 99 10/30/2020   CREATININE 2.71 (H) 10/30/2020   BUN 68 (H) 10/30/2020   CO2 20 (L) 10/30/2020   INR 2.7 (H) 10/06/2020   HGBA1C 5.8 (H) 10/06/2020     PT/INR:  Recent Labs    10/08/2020 2100  LABPROT 28.1*  INR 2.7*   Radiology: CT ABDOMEN PELVIS WO CONTRAST  Addendum Date: 10/30/2020   ADDENDUM REPORT: 10/30/2020 02:04 ADDENDUM: Critical Value/emergent results were called by telephone at the time of interpretation on 10/30/2020 at 2:03 am to Dr. Violet Baldy, Who verbally acknowledged these results. Electronically Signed   By: Deatra Robinson M.D.   On: 10/30/2020 02:04   Result Date: 10/30/2020 CLINICAL DATA:  Chest and abdominal pain.  Cardiopulmonary arrest. EXAM: CT CHEST, ABDOMEN AND PELVIS WITHOUT  CONTRAST TECHNIQUE: Multidetector CT imaging of the chest, abdomen and pelvis was performed following the standard protocol without IV contrast. COMPARISON:  None. FINDINGS: CT CHEST FINDINGS Cardiovascular: Calcific aortic atherosclerosis. Normal heart size. No pericardial effusion. Mediastinum/Nodes: No adenopathy. Esophageal catheter terminates in the stomach. Normal thyroid. Lungs/Pleura: Left-greater-than-right pleural effusions. There is a left-sided chest tube terminates near the left apex. Hazy opacities in the right upper lobe. Bibasilar atelectasis. Endotracheal tube tip is at the level of the clavicular heads. Musculoskeletal: No chest wall mass or suspicious bone lesions identified. CT ABDOMEN PELVIS FINDINGS Hepatobiliary: Unremarkable liver. Cholelithiasis without other gallbladder abnormality. Pancreas: Unremarkable. No pancreatic ductal dilatation or surrounding inflammatory changes. Spleen: Normal in size without focal abnormality. Adrenals/Urinary Tract: Normal adrenal glands. Multiple 3-4 mm calcifications at the right renal hilum. Stomach/Bowel: There is enteric contrast within distal small bowel and throughout the colon. There is no extravasated contrast visible. There is a posterior approach percutaneous right lower quadrant brain within an abscess cavity that is decreased in size. The abscess cavity is directly adjacent to the distal second portion of the duodenum. Vascular/Lymphatic: Aortic atherosclerosis. No enlarged abdominal or pelvic lymph nodes. Reproductive: Prostate is unremarkable. Other: There is a right retroperitoneal hematoma along the course of the drainage catheter. Dimensions are approximately 4.9 x 19.9 x 5.4 cm. There is intramuscular hematoma of the right psoas and iliacus muscles. Musculoskeletal: No acute or significant osseous findings. IMPRESSION: 1. Right lower quadrant percutaneous drainage catheter with decreased size of the abscess cavity. The abscess cavity is  directly adjacent to the distal second portion of the duodenum. No extravasated contrast. 2. Right retroperitoneal hematoma along the course of the drainage catheter, measuring approximately 4.9 x 19.9 x 5.4 cm in involving the right psoas and iliacus muscles. 3. Left-greater-than-right pleural effusions with left-sided chest tube terminating near the left apex. Left-sided extrapleural collection has decreased in size. Aortic Atherosclerosis (ICD10-I70.0). Electronically Signed: By: Deatra Robinson M.D. On: 10/30/2020 01:53   CT CHEST WO CONTRAST  Addendum Date: 10/30/2020   ADDENDUM REPORT: 10/30/2020 02:04 ADDENDUM: Critical Value/emergent results were called by telephone at the time of interpretation on 10/30/2020 at 2:03 am to Dr. Violet Baldy, Who verbally acknowledged these results. Electronically Signed   By: Deatra Robinson M.D.   On: 10/30/2020 02:04   Result Date: 10/30/2020 CLINICAL DATA:  Chest and abdominal pain.  Cardiopulmonary arrest. EXAM: CT CHEST, ABDOMEN AND PELVIS WITHOUT CONTRAST TECHNIQUE: Multidetector CT imaging of the chest, abdomen and pelvis was performed following the standard protocol without IV contrast. COMPARISON:  None. FINDINGS: CT CHEST FINDINGS Cardiovascular: Calcific aortic atherosclerosis. Normal heart size. No pericardial effusion. Mediastinum/Nodes: No adenopathy. Esophageal catheter terminates in the stomach. Normal thyroid. Lungs/Pleura: Left-greater-than-right  pleural effusions. There is a left-sided chest tube terminates near the left apex. Hazy opacities in the right upper lobe. Bibasilar atelectasis. Endotracheal tube tip is at the level of the clavicular heads. Musculoskeletal: No chest wall mass or suspicious bone lesions identified. CT ABDOMEN PELVIS FINDINGS Hepatobiliary: Unremarkable liver. Cholelithiasis without other gallbladder abnormality. Pancreas: Unremarkable. No pancreatic ductal dilatation or surrounding inflammatory changes. Spleen: Normal in size  without focal abnormality. Adrenals/Urinary Tract: Normal adrenal glands. Multiple 3-4 mm calcifications at the right renal hilum. Stomach/Bowel: There is enteric contrast within distal small bowel and throughout the colon. There is no extravasated contrast visible. There is a posterior approach percutaneous right lower quadrant brain within an abscess cavity that is decreased in size. The abscess cavity is directly adjacent to the distal second portion of the duodenum. Vascular/Lymphatic: Aortic atherosclerosis. No enlarged abdominal or pelvic lymph nodes. Reproductive: Prostate is unremarkable. Other: There is a right retroperitoneal hematoma along the course of the drainage catheter. Dimensions are approximately 4.9 x 19.9 x 5.4 cm. There is intramuscular hematoma of the right psoas and iliacus muscles. Musculoskeletal: No acute or significant osseous findings. IMPRESSION: 1. Right lower quadrant percutaneous drainage catheter with decreased size of the abscess cavity. The abscess cavity is directly adjacent to the distal second portion of the duodenum. No extravasated contrast. 2. Right retroperitoneal hematoma along the course of the drainage catheter, measuring approximately 4.9 x 19.9 x 5.4 cm in involving the right psoas and iliacus muscles. 3. Left-greater-than-right pleural effusions with left-sided chest tube terminating near the left apex. Left-sided extrapleural collection has decreased in size. Aortic Atherosclerosis (ICD10-I70.0). Electronically Signed: By: Deatra Robinson M.D. On: 10/30/2020 01:53   DG CHEST PORT 1 VIEW  Result Date: 10/30/2020 CLINICAL DATA:  Pneumothorax EXAM: PORTABLE CHEST 1 VIEW COMPARISON:  10/17/2020 FINDINGS: Endotracheal tube, nasogastric tube looped within the gastric fundus, right upper extremity PICC line with its tip within the superior right atrium, mediastinal drain, and large bore left chest tube extending to the apex are all unchanged. Lung volumes are small,  however, pulmonary insufflation remain stable since prior examination. Small right pleural effusion is present. Left lateral pleural thickening is unchanged. Bibasilar atelectasis is seen. No pneumothorax. Cardiac size within normal limits. No acute bone abnormality. IMPRESSION: Left chest tube in place. No pneumothorax. Stable left pleural thickening. Small right pleural effusion. Stable support lines and tubes. Electronically Signed   By: Helyn Numbers MD   On: 10/30/2020 05:17   DG CHEST PORT 1 VIEW  Result Date: 10/07/2020 CLINICAL DATA:  Cardiac arrest EXAM: PORTABLE CHEST 1 VIEW COMPARISON:  Same-day chest radiograph at 0529 hours. FINDINGS: Endotracheal tube with tip overlying the midthoracic trachea. Defibrillator leads overlie left chest/abdomen. Right upper extremity PICC with tip overlying the SVC. Left inferior approach thoracostomy tube with tip in the left lung apex, unchanged. The cardiomediastinal silhouette is partially obscured but appears unchanged. Similar size of the loculated left pleural effusion. Stable small right pleural effusion. Low lung volumes with hypoventilatory change in the dependent lungs. IMPRESSION: Endotracheal tube with tip overlying the midthoracic trachea. Stable size of the loculated left pleural effusion and position of the left chest tube. Stable small right pleural effusion and bibasilar atelectasis. Electronically Signed   By: Maudry Mayhew MD   On: 10/17/2020 16:50   DG Abd Portable 1V  Result Date: 10/28/2020 CLINICAL DATA:  Status post NG tube placement. EXAM: PORTABLE ABDOMEN - 1 VIEW COMPARISON:  Single-view of the abdomen 10/20/2020. FINDINGS: NG tube is  in place with both the tip and side-port in the stomach. Pigtail catheter right lower quadrant noted. IMPRESSION: NG tube in good position. Electronically Signed   By: Drusilla Kannerhomas  Dalessio M.D.   On: 03/20/2020 12:52    Assessment/Plan: S/P Procedure(s) (LRB): ESOPHAGOGASTRODUODENOSCOPY (EGD) (N/A) 1.  CV- History of PAF with RVR. On Neo Synephrine, Vasopression drips.  2. Pulmonary-Acute respiratory failure, likely secondary to aspiration PNA. Extubated 11/26. He was re intubated later afternoon yesterday after cardiac arrest. Chest tube with 60 cc last 24 hours. CXR this am appears stable. Will discuss with surgeon if chest tube to remain for now given patient's clinical deterioration.  3. GI-s/p emergent bedside EGD for bleeding. On Protonix drip. CT guided drainage of right retroperitoneal abscess 11/14. CT done yesterday (right lower quadrant percutaneous drainage catheter with decreased size of the abscess cavity, right retroperitoneal hematoma along the course of the drainage catheter, measuring approximately 4.9 x 19.9 x 5.4 cm,  left-greater-than-right pleural effusions, and left-sided extrapleural collection has decreased in size.. NPO, TPN. 4. ID-on Zosyn for RRP abscess, Klebsiella left pleural fluid 5. AKI-Creatinine this am increased to 2.71 6. CVA-s/p thrombectomy and R MCA stent;left hemiplegia 7. Anemia-H and H this am 8.5 and 25.3; previously transfused this am and also given FFP  Donielle Margaretann LovelessM Zimmerman PA-C 10/30/2020 7:21 AM   Patient was reintubated during coding event yesterday. We will keep chest tube in for now.  Skyler Carel Keane Scrape Derrika Ruffalo

## 2020-10-30 NOTE — Progress Notes (Signed)
VAST RN unable to hang pt's TPN due to unavailable central line lumen d/t multiple pressors and infusions. Spoke with Unit RN who verbalized unit will notify [facility] IF dedicated central line lumen becomes available to hang TPN within next 24 hrs.

## 2020-10-30 NOTE — Progress Notes (Signed)
PT Cancellation Note  Patient Details Name: Mark May MRN: [id number] DOB: [birth year]   Cancelled Treatment:    Reason Eval/Treat Not Completed: Patient not medically ready (Pt with new cardiac arrest, intubation, retroperitoneal bleed and Hgb6.3)   Rosea Dory B Patsey Pitstick 10/30/2020, 11:36 AM  Merryl Hacker, PT Acute Rehabilitation Services Pager: [phone] Office: [phone]

## 2020-10-30 NOTE — Progress Notes (Signed)
eLink Physician-Brief Progress Note Patient Name: Mark May DOB: 1956/10/01 MRN: [id number]   Date of Service  10/30/2020  HPI/Events of Note  Hyperkalemia - K+ = 5.0 and Hypocalcemia - Ca++ = 6.2.   eICU Interventions  Plan: 1. Lokelma 10 gm per tube now. 2. Replace Ca++. 3. Repeat BMP at 5 AM.     Intervention Category Major Interventions: Electrolyte abnormality - evaluation and management  Naim Murtha Eugene 10/30/2020, 11:41 PM

## 2020-10-30 NOTE — Progress Notes (Signed)
Nutrition Follow-up  DOCUMENTATION CODES:   Severe malnutrition in context of chronic illness  INTERVENTION:   TPN per Pharmacy to meet 100% of nutritional needs   NUTRITION DIAGNOSIS:   Severe Malnutrition related to chronic illness (stomach ulcer s/p partial gastrectomy) as evidenced by moderate fat depletion, severe fat depletion, moderate muscle depletion, severe muscle depletion.  Being addressed via TPN  GOAL:   Patient will meet greater than or equal to 90% of their needs  Met via TPN  MONITOR:   Skin, Weight trends, Labs, I & O's (TPN tolerance)  REASON FOR ASSESSMENT:   Rounds    ASSESSMENT:   Mark May is a 64 y.o. Asian male with PMH of hypertension, BPH, sciatica, stomach ulcer status post surgery in the past presented to ER for acute onset left-sided weakness, left facial droop and left hemianopia.  11/5 intubated, s/p mechanical thrombectomy of right MCA and stent 11/7 extubated 11/8 s/p BSE - recommend continue NPO 11/9 s/p BSE - advanced to dysphagia 3 with nectar thick liquids 11/12 Cortrak tube placed; tip of tube confirmed in stomach 11/14 s/p right retroperitoneal drain placement in IR in setting of right retroperitoneal/perinephric abscess 11/15 initiated TF after GI clearance 11/16 TF stopped 11/17 initiated TPN 11/20 intubated, thoracentesis with 650 mL removed 11/22 L.. hemothorax to OR for VATS 11/29 cardiac arrest, new retroperitoneal bleed, severe anemia;  EGD with large amount of dark red blood in gastric remnant but source not visualized  Noted MD recommend continuing strict NPO with TPN for nutrition  TPN stopped 11/29 afternoon s/p code due to access issues; TPN to resume today  TPN at goal of 90 ml/hr provides 122 g of protein, 2150 kcals  Hypoglycemia due to TPN being held yesterday; resuming TPN today which should resolve this  Anemia with HGb down to 6.3 this AM, 8.5 overnight  Current wt 58.8 kg; admit weight 56.5  kg   Labs: CBGS 57-71, Hgb 6.3 Meds: reviewed  Diet Order:   Diet Order            Diet NPO time specified  Diet effective now                 EDUCATION NEEDS:   Education needs have been addressed  Skin:  Skin Assessment: Reviewed RN Assessment  Last BM:  11/30 black stool  Height:   Ht Readings from Last 1 Encounters:  10/25/2020 _0  (1.702 m)    Weight:   Wt Readings from Last 1 Encounters:  10/30/20 58.8 kg    Ideal Body Weight:  67.3 kg  BMI:  Body mass index is 20.3 kg/m.  Estimated Nutritional Needs:   Kcal:  1950-2240 kcals  Protein:  115-130 g  Fluid:  > 1.8 L   Kerman Passey MS, RDN, LDN, CNSC Registered Dietitian III Clinical Nutrition RD Pager and On-Call Pager Number Located in [place]

## 2020-10-30 NOTE — Progress Notes (Signed)
eLink Physician-Brief Progress Note Patient Name: Mark May DOB: 16-Nov-1956 MRN: [id number]   Date of Service  10/30/2020  HPI/Events of Note  Notified by IR of CT finding of retroperitoneal bleed. Discussed with bedside RN and now able to titrate down norepinephrine. S/p transfusion 2 units PRBC and 2 units FFP for INR of 2.7  eICU Interventions  Ordered additional 4 units FFP and monitor H/H. Ordered to check fibrinogen level with plan to transfuse cryoprecipitate if less than 150        [name] 10/30/2020, 2:25 AM

## 2020-10-30 NOTE — Progress Notes (Signed)
[facility] Gastroenterology Progress Note  Mark May 64 y.o. 1956-03-09   Subjective: Intubated, elderly, thin  Objective: Vital signs: Vitals:   10/30/20 1215 10/30/20 1224  BP: (!) 96/58   Pulse: (!) 103 (!) 102  Resp: (!) 40 (!) 38  Temp:  97.9 F (36.6 C)  SpO2: 99% 99%    Physical Exam: Gen: intubated, thin, elderly CV: RRR Chest: Coarse breath sounds Abd: distended, decreased bowel sounds, no facial grimace to palpation Ext: no edema  Lab Results: Recent Labs    10/16/2020 0529 10/30/2020 0529 10/18/2020 1628 10/20/2020 1737 10/30/20 0339 10/30/20 0948  NA 147*   < > 147*   < > 141 143  K 4.0   < > 7.1*   < > 4.7 4.7  CL 111   < > 114*   < > 99 96*  CO2 24   < > 12*   < > 20* 21*  GLUCOSE 175*   < > 236*   < > 93 91  BUN 56*   < > 60*   < > 68* 75*  CREATININE 1.46*   < > 1.94*   < > 2.71* 3.04*  CALCIUM 7.8*   < > 8.2*   < > 6.5* 6.5*  MG 2.5*   < > 3.4*  --  1.9  --   PHOS 2.9  --   --   --   --   --    < > = values in this interval not displayed.   Recent Labs    10/14/2020 0529  AST 89*  ALT 124*  ALKPHOS 206*  BILITOT 1.3*  PROT 5.9*  ALBUMIN 1.4*   Recent Labs    10/28/20 1537 10/28/20 1537 10/18/2020 0529 10/30/2020 1735 10/30/20 0339 10/30/20 0948  WBC 9.6   < > 12.5*   < > 42.5* 28.5*  NEUTROABS 7.6  --  10.0*  --   --   --   HGB 8.0*   < > 8.2*   < > 8.5* 6.3*  HCT 24.7*   < > 26.0*   < > 25.3* 18.6*  MCV 86.7   < > 87.5   < > 88.2 86.5  PLT 268   < > 303   < > 209 140*   < > = values in this interval not displayed.      Assessment/Plan: GI bleed - question duodenal perforation with retroperitoneal hematoma on repeat CT this morning. Retroperitoneal drain in place. EGD last night with large amount of dark red blood in gastric remnant but not able to clear blood to visualize a source. Hgb drop to 6.3 this morning after multiple transfusions from 4.1 to 8.5. Black stools overnight. Vasopressors were able to be weaned some following volume  resuscitation. IV Heparin stopped yesterday. Continue FFP and Protonix drip. Surgery recs nonoperative approach with angiography if bleeding continues. I do not see a role for repeat EGD since I think the source of the retroperitoneal hematoma is related to the bleeding I saw in the examined duodenum and stomach and therefore would not be able to be corrected endoscopically. Long discussion with son in ICU updating him and answering questions I was able to answer. He expresses frustration at turnover of specialists weekly and wants to talk with the surgeon and CCM. Informed Dr. Delton Coombes who will update the family.   Shirley Friar 10/30/2020, 12:54 PM  Questions please call [phone] ID: Mark May, male   DOB: 03/18/1956, 64  y.o.   MRN: [id number]

## 2020-10-30 NOTE — Progress Notes (Addendum)
1 Day Post-Op  Subjective: Cardiac arrest and reintubation yesterday. Bedside upper endoscopy with blood in gastric remnant and one of the jejunal limbs without visualization of a source.   ROS: See above, otherwise other systems negative  Objective: Vital signs in last 24 hours: Temp:  [97.8 F (36.6 C)-101.2 F (38.4 C)] 100 F (37.8 C) (11/30 0856) Pulse Rate:  [87-138] 103 (11/30 0945) Resp:  [8-49] 30 (11/30 0945) BP: (49-193)/(14-154) 110/72 (11/30 0945) SpO2:  [80 %-100 %] 96 % (11/30 0945) FiO2 (%):  [40 %-100 %] 40 % (11/30 0744) Weight:  [58.8 kg] 58.8 kg (11/30 0500) Last BM Date: 10/17/2020  Intake/Output from previous day: 11/29 0701 - 11/30 0700 In: 5848.1 [I.V.:2273.8; ZOXWR:6045Blood:1754; NG/GT:1000; IV Piggyback:820.3] Out: 1175 [Urine:980; Emesis/NG output:125; Drains:10; Chest Tube:60] Intake/Output this shift: Total I/O In: 3351.4 [I.V.:3029.2; Blood:321; IV Piggyback:1.2] Out: 40 [Urine:40]  PE: Abd: edematous, no BS, patient not responding to palpation of abdomen.  Lab Results:  Recent Labs    10/30/2020 2100 10/30/20 0339  WBC 41.8* 42.5*  HGB 7.0* 8.5*  HCT 23.0* 25.3*  PLT 227 209   BMET Recent Labs    10/03/2020 2100 10/30/20 0339  NA 145 141  K 5.1 4.7  CL 101 99  CO2 15* 20*  GLUCOSE 265* 93  BUN 63* 68*  CREATININE 2.41* 2.71*  CALCIUM 6.4* 6.5*   PT/INR Recent Labs    10/19/2020 2100  LABPROT 28.1*  INR 2.7*   CMP     Component Value Date/Time   NA 141 10/30/2020 0339   NA 140 03/09/2018 1620   NA 136 01/06/2015 1448   K 4.7 10/30/2020 0339   K 3.6 01/06/2015 1448   CL 99 10/30/2020 0339   CL 99 01/06/2015 1448   CO2 20 (L) 10/30/2020 0339   CO2 28 01/06/2015 1448   GLUCOSE 93 10/30/2020 0339   GLUCOSE 154 (H) 01/06/2015 1448   BUN 68 (H) 10/30/2020 0339   BUN 12 03/09/2018 1620   BUN 12 01/06/2015 1448   CREATININE 2.71 (H) 10/30/2020 0339   CREATININE 0.88 01/06/2015 1448   CALCIUM 6.5 (L) 10/30/2020 0339    CALCIUM 8.7 01/06/2015 1448   PROT 5.9 (L) 10/02/2020 0529   PROT 7.4 03/09/2018 1620   PROT 8.0 01/06/2015 1448   ALBUMIN 1.4 (L) 10/26/2020 0529   ALBUMIN 4.8 03/09/2018 1620   ALBUMIN 4.2 01/06/2015 1448   AST 89 (H) 10/07/2020 0529   AST 32 01/06/2015 1448   ALT 124 (H) 10/15/2020 0529   ALT 44 01/06/2015 1448   ALKPHOS 206 (H) 10/21/2020 0529   ALKPHOS 123 (H) 01/06/2015 1448   BILITOT 1.3 (H) 10/12/2020 0529   BILITOT 0.6 03/09/2018 1620   BILITOT 0.3 01/06/2015 1448   GFRNONAA 25 (L) 10/30/2020 0339   GFRNONAA >60 01/06/2015 1448   GFRAA 113 03/09/2018 1620   GFRAA >60 01/06/2015 1448   Lipase  No results found for: LIPASE     Studies/Results: CT ABDOMEN PELVIS WO CONTRAST  Addendum Date: 10/30/2020   ADDENDUM REPORT: 10/30/2020 02:04 ADDENDUM: Critical Value/emergent results were called by telephone at the time of interpretation on 10/30/2020 at 2:03 am to Dr. Violet BaldyAventura, Who verbally acknowledged these results. Electronically Signed   By: Deatra RobinsonKevin  Herman M.D.   On: 10/30/2020 02:04   Result Date: 10/30/2020 CLINICAL DATA:  Chest and abdominal pain.  Cardiopulmonary arrest. EXAM: CT CHEST, ABDOMEN AND PELVIS WITHOUT CONTRAST TECHNIQUE: Multidetector CT imaging of the chest, abdomen and  pelvis was performed following the standard protocol without IV contrast. COMPARISON:  None. FINDINGS: CT CHEST FINDINGS Cardiovascular: Calcific aortic atherosclerosis. Normal heart size. No pericardial effusion. Mediastinum/Nodes: No adenopathy. Esophageal catheter terminates in the stomach. Normal thyroid. Lungs/Pleura: Left-greater-than-right pleural effusions. There is a left-sided chest tube terminates near the left apex. Hazy opacities in the right upper lobe. Bibasilar atelectasis. Endotracheal tube tip is at the level of the clavicular heads. Musculoskeletal: No chest wall mass or suspicious bone lesions identified. CT ABDOMEN PELVIS FINDINGS Hepatobiliary: Unremarkable liver.  Cholelithiasis without other gallbladder abnormality. Pancreas: Unremarkable. No pancreatic ductal dilatation or surrounding inflammatory changes. Spleen: Normal in size without focal abnormality. Adrenals/Urinary Tract: Normal adrenal glands. Multiple 3-4 mm calcifications at the right renal hilum. Stomach/Bowel: There is enteric contrast within distal small bowel and throughout the colon. There is no extravasated contrast visible. There is a posterior approach percutaneous right lower quadrant brain within an abscess cavity that is decreased in size. The abscess cavity is directly adjacent to the distal second portion of the duodenum. Vascular/Lymphatic: Aortic atherosclerosis. No enlarged abdominal or pelvic lymph nodes. Reproductive: Prostate is unremarkable. Other: There is a right retroperitoneal hematoma along the course of the drainage catheter. Dimensions are approximately 4.9 x 19.9 x 5.4 cm. There is intramuscular hematoma of the right psoas and iliacus muscles. Musculoskeletal: No acute or significant osseous findings. IMPRESSION: 1. Right lower quadrant percutaneous drainage catheter with decreased size of the abscess cavity. The abscess cavity is directly adjacent to the distal second portion of the duodenum. No extravasated contrast. 2. Right retroperitoneal hematoma along the course of the drainage catheter, measuring approximately 4.9 x 19.9 x 5.4 cm in involving the right psoas and iliacus muscles. 3. Left-greater-than-right pleural effusions with left-sided chest tube terminating near the left apex. Left-sided extrapleural collection has decreased in size. Aortic Atherosclerosis (ICD10-I70.0). Electronically Signed: By: Deatra Robinson M.D. On: 10/30/2020 01:53   CT CHEST WO CONTRAST  Addendum Date: 10/30/2020   ADDENDUM REPORT: 10/30/2020 02:04 ADDENDUM: Critical Value/emergent results were called by telephone at the time of interpretation on 10/30/2020 at 2:03 am to Dr. Violet Baldy, Who verbally  acknowledged these results. Electronically Signed   By: Deatra Robinson M.D.   On: 10/30/2020 02:04   Result Date: 10/30/2020 CLINICAL DATA:  Chest and abdominal pain.  Cardiopulmonary arrest. EXAM: CT CHEST, ABDOMEN AND PELVIS WITHOUT CONTRAST TECHNIQUE: Multidetector CT imaging of the chest, abdomen and pelvis was performed following the standard protocol without IV contrast. COMPARISON:  None. FINDINGS: CT CHEST FINDINGS Cardiovascular: Calcific aortic atherosclerosis. Normal heart size. No pericardial effusion. Mediastinum/Nodes: No adenopathy. Esophageal catheter terminates in the stomach. Normal thyroid. Lungs/Pleura: Left-greater-than-right pleural effusions. There is a left-sided chest tube terminates near the left apex. Hazy opacities in the right upper lobe. Bibasilar atelectasis. Endotracheal tube tip is at the level of the clavicular heads. Musculoskeletal: No chest wall mass or suspicious bone lesions identified. CT ABDOMEN PELVIS FINDINGS Hepatobiliary: Unremarkable liver. Cholelithiasis without other gallbladder abnormality. Pancreas: Unremarkable. No pancreatic ductal dilatation or surrounding inflammatory changes. Spleen: Normal in size without focal abnormality. Adrenals/Urinary Tract: Normal adrenal glands. Multiple 3-4 mm calcifications at the right renal hilum. Stomach/Bowel: There is enteric contrast within distal small bowel and throughout the colon. There is no extravasated contrast visible. There is a posterior approach percutaneous right lower quadrant brain within an abscess cavity that is decreased in size. The abscess cavity is directly adjacent to the distal second portion of the duodenum. Vascular/Lymphatic: Aortic atherosclerosis. No enlarged abdominal or  pelvic lymph nodes. Reproductive: Prostate is unremarkable. Other: There is a right retroperitoneal hematoma along the course of the drainage catheter. Dimensions are approximately 4.9 x 19.9 x 5.4 cm. There is intramuscular  hematoma of the right psoas and iliacus muscles. Musculoskeletal: No acute or significant osseous findings. IMPRESSION: 1. Right lower quadrant percutaneous drainage catheter with decreased size of the abscess cavity. The abscess cavity is directly adjacent to the distal second portion of the duodenum. No extravasated contrast. 2. Right retroperitoneal hematoma along the course of the drainage catheter, measuring approximately 4.9 x 19.9 x 5.4 cm in involving the right psoas and iliacus muscles. 3. Left-greater-than-right pleural effusions with left-sided chest tube terminating near the left apex. Left-sided extrapleural collection has decreased in size. Aortic Atherosclerosis (ICD10-I70.0). Electronically Signed: By: Deatra Robinson M.D. On: 10/30/2020 01:53   DG CHEST PORT 1 VIEW  Result Date: 10/30/2020 CLINICAL DATA:  Pneumothorax EXAM: PORTABLE CHEST 1 VIEW COMPARISON:  11/05/2020 FINDINGS: Endotracheal tube, nasogastric tube looped within the gastric fundus, right upper extremity PICC line with its tip within the superior right atrium, mediastinal drain, and large bore left chest tube extending to the apex are all unchanged. Lung volumes are small, however, pulmonary insufflation remain stable since prior examination. Small right pleural effusion is present. Left lateral pleural thickening is unchanged. Bibasilar atelectasis is seen. No pneumothorax. Cardiac size within normal limits. No acute bone abnormality. IMPRESSION: Left chest tube in place. No pneumothorax. Stable left pleural thickening. Small right pleural effusion. Stable support lines and tubes. Electronically Signed   By: Helyn Numbers MD   On: 10/30/2020 05:17   DG CHEST PORT 1 VIEW  Result Date: 11-05-2020 CLINICAL DATA:  Cardiac arrest EXAM: PORTABLE CHEST 1 VIEW COMPARISON:  Same-day chest radiograph at 0529 hours. FINDINGS: Endotracheal tube with tip overlying the midthoracic trachea. Defibrillator leads overlie left chest/abdomen.  Right upper extremity PICC with tip overlying the SVC. Left inferior approach thoracostomy tube with tip in the left lung apex, unchanged. The cardiomediastinal silhouette is partially obscured but appears unchanged. Similar size of the loculated left pleural effusion. Stable small right pleural effusion. Low lung volumes with hypoventilatory change in the dependent lungs. IMPRESSION: Endotracheal tube with tip overlying the midthoracic trachea. Stable size of the loculated left pleural effusion and position of the left chest tube. Stable small right pleural effusion and bibasilar atelectasis. Electronically Signed   By: Maudry Mayhew MD   On: 11/05/20 16:50   DG CHEST PORT 1 VIEW  Result Date: Nov 05, 2020 CLINICAL DATA:  Empyema. EXAM: PORTABLE CHEST 1 VIEW COMPARISON:  October 27, 2020. FINDINGS: Stable cardiomediastinal silhouette. Left-sided chest tube is noted without pneumothorax. Right-sided PICC line is unchanged. Stable loculated left pleural effusion is noted. Stable small right pleural effusion is noted. Mild bibasilar atelectasis is noted. Bony thorax is unremarkable. IMPRESSION: Left-sided chest tube is noted without pneumothorax. Stable loculated left pleural effusion is noted. Stable small right pleural effusion is noted. Mild bibasilar atelectasis is noted. Electronically Signed   By: Lupita Raider M.D.   On: 11-05-20 08:25   DG Abd Portable 1V  Result Date: 05-Nov-2020 CLINICAL DATA:  Status post NG tube placement. EXAM: PORTABLE ABDOMEN - 1 VIEW COMPARISON:  Single-view of the abdomen 10/20/2020. FINDINGS: NG tube is in place with both the tip and side-port in the stomach. Pigtail catheter right lower quadrant noted. IMPRESSION: NG tube in good position. Electronically Signed   By: Drusilla Kanner M.D.   On: Nov 05, 2020 12:52  Anti-infectives: Anti-infectives (From admission, onward)   Start     Dose/Rate Route Frequency Ordered Stop   11/19/20 1400  fluconazole (DIFLUCAN)  IVPB 200 mg        200 mg 100 mL/hr over 60 Minutes Intravenous Every 24 hours 2020/11/19 1148     10/19/20 1400  piperacillin-tazobactam (ZOSYN) IVPB 3.375 g        3.375 g 12.5 mL/hr over 240 Minutes Intravenous Every 8 hours 10/19/20 1150     10/19/20 0200  vancomycin (VANCOREADY) IVPB 500 mg/100 mL  Status:  Discontinued        500 mg 100 mL/hr over 60 Minutes Intravenous Every 12 hours 10/19/20 0025 10/19/20 0951   10/19/20 0200  metroNIDAZOLE (FLAGYL) IVPB 500 mg  Status:  Discontinued        500 mg 100 mL/hr over 60 Minutes Intravenous Every 8 hours 10/19/20 0045 10/19/20 1150   10/19/20 0115  ceFEPIme (MAXIPIME) 2 g in sodium chloride 0.9 % 100 mL IVPB  Status:  Discontinued        2 g 200 mL/hr over 30 Minutes Intravenous Every 12 hours 10/19/20 0025 10/19/20 1150   10/17/20 1400  Ampicillin-Sulbactam (UNASYN) 3 g in sodium chloride 0.9 % 100 mL IVPB  Status:  Discontinued        3 g 200 mL/hr over 30 Minutes Intravenous Every 6 hours 10/17/20 1207 10/18/20 2325   10/17/20 1400  fluconazole (DIFLUCAN) IVPB 400 mg  Status:  Discontinued        400 mg 100 mL/hr over 120 Minutes Intravenous Every 24 hours 10/17/20 1207 11-19-2020 1148   10/15/20 1630  vancomycin (VANCOREADY) IVPB 500 mg/100 mL  Status:  Discontinued        500 mg 100 mL/hr over 60 Minutes Intravenous Every 12 hours 10/15/20 0940 10/17/20 1205   10/14/20 0400  vancomycin (VANCOREADY) IVPB 500 mg/100 mL  Status:  Discontinued        500 mg 100 mL/hr over 60 Minutes Intravenous Every 24 hours 10/13/20 0233 10/15/20 0940   10/13/20 0330  vancomycin (VANCOREADY) IVPB 750 mg/150 mL        750 mg 150 mL/hr over 60 Minutes Intravenous  Once 10/13/20 0233 10/13/20 0439   10/12/20 1800  ceFEPIme (MAXIPIME) 2 g in sodium chloride 0.9 % 100 mL IVPB  Status:  Discontinued        2 g 200 mL/hr over 30 Minutes Intravenous Every 24 hours 10/11/20 1718 10/11/20 1742   10/12/20 1800  vancomycin (VANCOREADY) IVPB 750 mg/150 mL   Status:  Discontinued        750 mg 150 mL/hr over 60 Minutes Intravenous Every 24 hours 10/11/20 1724 10/11/20 1758   10/12/20 0200  metroNIDAZOLE (FLAGYL) tablet 500 mg  Status:  Discontinued        500 mg Oral Every 8 hours 10/11/20 1715 10/11/20 1758   10/12/20 0030  piperacillin-tazobactam (ZOSYN) IVPB 3.375 g  Status:  Discontinued        3.375 g 12.5 mL/hr over 240 Minutes Intravenous Every 8 hours 10/11/20 1805 10/17/20 1205   10/11/20 1830  piperacillin-tazobactam (ZOSYN) IVPB 3.375 g        3.375 g 100 mL/hr over 30 Minutes Intravenous  Once 10/11/20 1805 10/11/20 1839   10/11/20 1715  ceFEPIme (MAXIPIME) 2 g in sodium chloride 0.9 % 100 mL IVPB  Status:  Discontinued        2 g 200 mL/hr over 30 Minutes Intravenous STAT 10/11/20  1710 10/11/20 1742   10/11/20 1715  metroNIDAZOLE (FLAGYL) IVPB 500 mg  Status:  Discontinued        500 mg 100 mL/hr over 60 Minutes Intravenous STAT 10/11/20 1710 10/11/20 1807   10/11/20 1715  vancomycin (VANCOCIN) IVPB 1000 mg/200 mL premix  Status:  Discontinued        1,000 mg 200 mL/hr over 60 Minutes Intravenous STAT 10/11/20 1710 10/11/20 1804   10/19/2020 1541  ceFAZolin (ANCEF) 2-4 GM/100ML-% IVPB       Note to Pharmacy: [facility]   : cabinet override      10/19/2020 1541 10/06/20 0344       Assessment/Plan RLL PNA Metabolic encephalopathy  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 Remains on pressor support this morning.  Retroperitoneal bleeding/hematoma -  4.9 x 19.9 x 5.4 cm, continue to hold heparin, transfused 2 u pRBC, 4 u FFP, 2 more units FFP to be given today. Repeat labs pending.  Hx of PUD s/p partial gastrectomy 1990 in [place]  Contained bowel perforation  with intra-abdominal abscess - s/p IR drain placement 11/14 - growing klebsiella, continue drain. - suspect that the second portion of the duodenum is most likely site of perforation based on CT.  - s/p emergent EGD at bedside 11/29 by Dr. Bosie Clos due to blood encountered during NG placement and cardiac arrest. Blood was found in gastric remnant and one of the jejunal limbs but no source of bleeding was found.  - continue NG To LIWS - recommend STRICT NPO and TPN - no acute surgical needs. Hold heparin. Monitor for ongoing signs off bleeding off of heparin gtt.    L hemothorax after thoracentesis- S/p VATS 11/22 Dr. Cliffton Asters, per TCTS, pleural culture also growing klebsiella.  FEN: NPO, NGT to LIWS, IVF, PICC and TPN  VTE: SCDs, anticoagulation held in the setting of acute blood loss ID: current abx are zosyn and diflucan; RP cultures and pleural cultures both growing klebsiella, raising concern that the two collections are connected.    LOS: 25 days    Adam Phenix , [facility] 10/30/2020, 10:20 AM Please see Amion for pager number during day hours 7:00am-4:30pm or 7:00am -11:30am on weekends

## 2020-10-30 NOTE — Progress Notes (Signed)
NAME:  Mark May, MRN:  [id number], DOB:  June 25, 1956, LOS: 25 ADMISSION DATE:  2020/10/18, CONSULTATION DATE: 10/18/20 REFERRING MD: Dr. Curtis Sites, CHIEF COMPLAINT: Left-sided weakness  Brief History   64 year old male with HTN and HLD who presented with left-sided weakness, noted to have right MCA occlusion status post thrombectomy and stent placement 11/5.  Hospitalization complicated by hypoxic respiratory failure secondary to pulmonary edema requiring BiPAP and lasix.  Awaiting CIR placement however, on the evening of 11/10, he was noted to become more lethargic with tachycardia and tachypnea.  Had complained of abdominal pain with some distention and chest pain which resolved after burping.  KUB obtained which was normal.  On 11/11, he was somewhat more responsive but now with increasing sCr in which lisinopril was stopped and development of fever 102.6  TRH initially consulted for help with medical management however on their evaluation, PCCM consulted given ill appearance and developing hypotension and new onset Afib with RVR found to have sepsis with RLL pneumonia likely due to aspiration started on zosyn. PCCM consulted for continue right abdominal pain and distension with concern for bowel perforation secondary to Cortrak.  On 11/13, CT abd/ pelvis showed large right perinephric fluid collection and pockets of air concerning for an infectious process/abscess. Underwent CT guided drain placement of right retroperitoneal abscess.  On 11/15, he developed melena with anemia.  GI consulted with plans to medically management and deferred EGD.  Surgery was consulted on 11/17 with imaging concerning for possible contained bowel perforation.  On 11/20, he developed respiratory distress requiring intubation. Underwent thoracentesis on 11/20.  Hypotensive with Hgb drob on 11/21, CT ABD/Pelvis w/ concern for active RP bleed.  Subsequently, developed hemothorax requiring OR for VATS on 11/22.    Past  Medical History  Hypertension Peptic ulcer disease, previous partial gastrectomy (in [place]/ 1990) Gout BPH Sciatica   Significant Hospital Events   11/05 Mechanical thrombectomy of right MCA and stent 11/5 11/11 PCCM reconsulted  11/12 Cortrak placed 11/14 CT guided drainage for R RP abscess with JP 11/17 PCCM reconsulted 11/18 CCM recalled by rapid response, fever, resp distress, CXR new left infiltrates with left pleural effusion 11/20 Thoracentesis  11/21 Hypotensive with Hgb drop to 6.1 s/p 2 units PRBC, CT ABD with concerning for active bleeding 11/22 To OR for VATS, decortication  11/29 cardiac arrest, VDRF, profound acidosis due to new retroperitoneal bleed, severe anemia 11/29 EGD >> blood in all areas of the gastric remnant with no clearly defined source of bleeding   Consults:  IR Neurology PCCM 11/5- 11/8; 11/11; 11/16-11/17, 11/18 -  [facility] Urology 11/13 GI 11/15 CCS 11/17  Procedures:  R Femoral Sheath 11/6 >> 11/6 RUE PICC 11/17 >>  ETT 11/20 >> extub ETT 11/29 >>   Significant Diagnostic Tests:  11/5 CT head >> No acute abnormality.  11/5 CTA head / neck >> Severe distal right M1 stenosis or subocclusive thrombus, Right MCA infarct with extensive penumbra, 4 mm right supraclinoid ICA aneurysm. Widely patent cervical carotid and vertebral arteries. 11/5 Postprocedure CT head >> Hypodensity in the right lateral temporal lobe now shows mild hemorrhage or contrast enhancement. This is most consistent with acute infarct. There has been interval stenting of the right middle cerebral artery. 11/6 MRI brain >> Fairly extensive patchy acute/early subacute infarcts within the right MCA/watershed territory.  Additional small acute/early subacute infarcts within the dorsal right thalamus and left basal ganglia. 11/6  MRA head >>  Interval stenting of the M1/M2 right  middle cerebral artery. Flow related signal is present proximal and distal to the stent suggestive of stent  patency. 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 11/6 TTE >> LVEF 70-75%, no RWMA, Grade II diastolic dysfunction, elevated LA pressure 11/13 CT ABD/Pelvis >> large right perinephric fluid collection and pockets of air concerning for an infectious process/abscess. Cholelithiasis. Nonobstructing right renal calculi. No hydronephrosis. Colonic diverticulosis. No bowel obstruction. Partially visualized small bilateral pleural effusions with complete consolidative changes of the visualized lower lobes. 11/16 CT ABD/Pelvis >> interval placement of drainage catheter into the right perinephric space in the area of previously seen gas and fluid collection. Fluid collection has decreased since prior study. There is now contrast material seen within the perinephric space and extending into the right paracolic gutter. This is concerning for possible fistulous communication to the colon. May consider contrast injection through the drainage catheter under fluoroscopic guidance to assess for enteric fistula. Moderate bilateral pleural effusions with bibasilar atelectasis or consolidation, unchanged.  Cholelithiasis.  NG tube in the stomach.  Aortic atherosclerosis.  Small to moderate free fluid in the pelvis. 11/20 L Thoracentesis 650 ml cloudy yellow fluid 11/21 CT abd/ pelvis >> very large area of intra-abdominal fluid and blood within the left upper quadrant, with an appearance worrisome for active bleeding.  Stable position of the right-sided percutaneous drainage catheter with a moderate amount of surrounding contained free air, fluid and inflammatory fat stranding. Fistulous communication with the adjacent portion of large bowel cannot be excluded. Moderate to marked amount of para muscular subcutaneous inflammatory fat stranding along the lateral aspects of the left abdominal and pelvic walls. 11/30 CT abdomen pelvis chest >> RLQ percutaneous drainage catheter in place,  decreased size abscess cavity.  This is adjacent to the distal second portion of the duodenum but no evidence of extravasation of contrast.  Right retroperitoneal hematoma along the course of the drainage catheter 4.9 x 19.9 x 5.4 cm (right psoas, iliacus).  Left greater than right pleural effusions with a left-sided chest tube in place, left extrapleural collection.  Micro Data:  MRSA PCR 11/5 >> negative COVID 11/5 >> negative Flu 11/5 >> negative UC 11/10 >> neg BCx2 11/11 >> neg BC x2 11/13 >> neg Right RRP abscess 11/14 >> moderate candida, klebsiella >>  BCx 2 11/19 >> Left pleural fluid 11/20 >> Klebsiella >>   Antimicrobials:  Cefazolin 11/5 >> 11/6 Flagyl 11/11 x1 Cefepime 11/11, 11/18 >> 11/19  Vanco 11/12 >> 11/16, 11/18  Unasyn 11/17 >> 11/18  Flagyl 11/18 >> 11/19  Diflucan 11/17 >>  Zosyn 11/11 >> 11/17, 11/19 >>   Interim history/subjective:   Significant clinical change 11/29, evolving hypotension, acidosis and then cardiac arrest.  CPR for 20 minutes.  Intubated. CT chest abdomen pelvis as above Underwent EGD, blood in the gastric remnant without any clear acute bleeding source Remains on norepinephrine, weaning, vasopressin 0.03 Remains on bicarbonate infusion  Objective   Blood pressure 133/77, pulse 99, temperature (!) 100.8 F (38.2 C), resp. rate (!) 30, weight 58.8 kg, SpO2 100 %. CVP:  [10 mmHg] 10 mmHg  Vent Mode: PRVC FiO2 (%):  [40 %-100 %] 40 % Set Rate:  [20 bmp-30 bmp] 30 bmp Vt Set:  [530 mL] 530 mL PEEP:  [5 cmH20] 5 cmH20 Plateau Pressure:  [20 cmH20-21 cmH20] 21 cmH20   Intake/Output Summary (Last 24 hours) at 10/30/2020 0817 Last data filed at 10/30/2020 0736 Gross per 24 hour  Intake 6169.13 ml  Output 1175 ml  Net 4994.13 ml   Filed Weights   10/25/20 0340 10/26/20 0500 10/30/20 0500  Weight: 59.3 kg 56.1 kg 58.8 kg    Physical Exam General: Thin ill-appearing man, sedated, ventilated HEENT: ET tube in place, old dark blood  from his G-tube. Neuro: Sedated, some movement upper extremities with stimulation, does not open eyes or track CV: Irregular, tachycardic, distant PULM: Decreased bilateral breath sounds, left chest tube in place without air leak GI: Somewhat firm, drain in place with brownish fluid.  Hypoactive bowel sounds Extremities: No edema Skin: No rash  Resolved Hospital Problems:  Hypernatremia  Assessment & Plan:   Complex clinical course: admitted with R MCA CVA.   Acute respiratory failure secondary to aspiration pneumonia with bilateral pleural effusion, left klebsiella empyema, ? Connection with abdominal process Hemothorax following thoracentesis, s/p VATS 11/22. No active bleeding after clot removal S/p thora on 11/20 with exudative properties, klebsiella in pleural culture & RP abscess.  s/post thora extravasation on CTA chest  Recurrent acute respiratory failure in setting cardiac arrest, acidosis, retroperitoneal bleed Plan: Continue supportive care, hyperventilation given profound metabolic acidosis.  Currently PRVC 8 cc/kg No plans to initiate PSV 11/30, work on spontaneous breathing once acute metabolic and hemodynamic issues are stabilizing Follow chest x-ray We will discuss with TCTS whether to remove left chest tube  Mixed Shock - Septic + ABLA / Hypovolemic.  Recurrent on 11/29 due to acute increased retroperitoneal bleeding and also upper GI bleeding.  Resulting cardiac arrest, CPR 20 minutes Initial shock multifactorial, ABLA and septic. S/p 2 units blood on 11/21.  Klebsiella Empyema + fluid collection in abd. Recurrent episode 11/29 appears to be more likely due to acute blood loss Sepsis secondary to RLL PNA, Intra-abdominal Abscess Suspected aspiration and intraabdominal abscess (klebsiella and candida) in setting of bowel perforation, left empyema.  No clear evidence for connection between the intra-abdominal process and pleural space Plan: Current regimen fluconazole  11/17, Zosyn restarted 11/19, Plan for 14 days additional therapy Continue norepinephrine, wean as able, goal MAP 65 Continue vasopressin, plan to DC when norepinephrine needs decreased Protonix infusion.  Appreciate GI evaluation 11/29.  No clear gastric or duodenal source identified on EGD 11/29.  Question whether he may require repeat  Intra-abdominal/retroperitoneal abscess with Contained Bowel Perforation.  No clear persistent connection between duodenum and abscess based on CT 11/29 Hx of PUD, Partial Gastrectomy P:  Appreciate CCS assistance.  Will discuss 11/29 abdominal CT findings with them.  Unclear whether there are surgical options here.  Clearly they would carry significant risk given his overall state of health Continue fluconazole, Zosyn as above  Severe lactic acidosis 11/29 Restoring adequate perfusion Temporizing with bicarbonate infusion, currently 125 cc/h, plan to decrease 11/30 to 50 cc/h and follow Follow lactic acid for clearance  AKI, progressive following arrest 11/29 Plan: Follow BMP, urine output Aggressively volume resuscitate with IV fluids, blood Avoid nephrotoxins Renal dose medications  PAF with RVR, currently rate controlled Currently rate controlled, some moderate tachycardia in setting of his hemodynamic instability 11/29, 11/30 Heparin infusion stopped 11/29 in setting of acute retroperitoneal bleeding.  Protamine given.  Continue to hold for now given his overall instability  Acute R MCA CVA s/p thrombectomy and right MCA stent placement by IR with TICI 3 Left hemiplegia Dysphagia Plan: Aspirin and heparin both stopped on 11/29  Severe Protein Calorie Malnutrition TPN held 11/29.  Will consider restarting once stable and depending on available IV access   Best  practice:  Diet: bowel rest/ NPO.  TPN on hold Pain/Anxiety/Delirium protocol (if indicated): Intermittent Versed, fentanyl VAP protocol (if indicated): Oral care protocol DVT  prophylaxis: SCDs for now GI prophylaxis: PPI infusion Glucose control: SSI Mobility: bed rest Code Status: full code Family Communication: Updating daughter and son at bedside multiple times daily Disposition:  ICU  LABS    PULMONARY Recent Labs  Lab 10/12/2020 1737 10/26/2020 2055  PHART 7.198* 7.332*  PCO2ART 43.7 25.8*  PO2ART 400* 170*  HCO3 17.0* 13.4*  TCO2 18*  --   O2SAT 100.0 98.8   CBC Recent Labs  Lab 10/25/2020 1735 10/07/2020 1735 10/25/2020 1737 10/11/2020 2100 10/30/20 0339  HGB 4.1*   < > 5.4* 7.0* 8.5*  HCT 14.1*   < > 16.0* 23.0* 25.3*  WBC 37.6*  --   --  41.8* 42.5*  PLT 212  --   --  227 209   < > = values in this interval not displayed.   COAGULATION Recent Labs  Lab 10/28/2020 2100  INR 2.7*   CARDIAC  No results for input(s): TROPONINI in the last 168 hours. No results for input(s): PROBNP in the last 168 hours.  CHEMISTRY Recent Labs  Lab 10/25/20 0213 10/25/20 0213 10/25/20 0741 10/25/20 0741 10/26/20 0500 10/26/20 0500 10/27/20 0443 10/27/20 0620 10/28/20 0629 10/28/20 0629 10/01/2020 0529 10/16/2020 0529 10/15/2020 1628 10/04/2020 1628 10/04/2020 1737 10/09/2020 1737 10/16/2020 2100 10/30/20 0339  NA 139   < > 142   < > 144   < > 143   < > 149*   < > 147*  --  147*  --  149*  --  145 141  K 4.1   < > 3.5   < > 2.8*   < > 3.8   < > 3.8   < > 4.0   < > 7.1*   < > 4.5   < > 5.1 4.7  CL 105   < > 109   < > 106   < > 104   < > 112*  --  111  --  114*  --   --   --  101 99  CO2 21*   < > 21*   < > 24   < > 26   < > 26  --  24  --  12*  --   --   --  15* 20*  GLUCOSE 563*   < > 130*   < > 165*   < > 433*   < > 123*  --  175*  --  236*  --   --   --  265* 93  BUN 72*   < > 77*   < > 80*   < > 66*   < > 62*  --  56*  --  60*  --   --   --  63* 68*  CREATININE 2.56*   < > 2.67*   < > 2.49*   < > 1.83*   < > 1.69*  --  1.46*  --  1.94*  --   --   --  2.41* 2.71*  CALCIUM 7.6*   < > 7.5*   < > 7.8*   < > 7.7*   < > 8.0*  --  7.8*  --  8.2*  --   --   --   6.4* 6.5*  MG 2.3   < > 2.1   < > 2.0  --   --   --  1.9  --  2.5*  --  3.4*  --   --   --   --  1.9  PHOS 2.2*  --  2.4*  --  2.9  --  3.0  --   --   --  2.9  --   --   --   --   --   --   --    < > = values in this interval not displayed.   Estimated Creatinine Clearance: 22.6 mL/min (A) (by C-G formula based on SCr of 2.71 mg/dL (H)).  LIVER Recent Labs  Lab 10/25/20 0213 10/25/20 0741 10/26/20 0500 10/15/2020 0529 10/26/2020 2100  AST 126* 103* 67* 89*  --   ALT 201* 184* 128* 124*  --   ALKPHOS 74 83 90 206*  --   BILITOT 1.7* 2.1* 2.1* 1.3*  --   PROT 4.6* 5.1* 5.3* 5.9*  --   ALBUMIN 1.1* 1.1* 1.1* 1.4*  --   INR  --   --   --   --  2.7*   INFECTIOUS Recent Labs  Lab 10/17/2020 1628 10/12/2020 2100  LATICACIDVEN >11.0* >11.0*   ENDOCRINE CBG (last 3)  Recent Labs    10/10/2020 2326 10/30/20 0319 10/30/20 0726  GLUCAP 216* 87 71    Independent CC time 35 minutes  Levy Pupaobert Cailie Bosshart, MD, PhD 10/30/2020, 8:17 AM [facility] [phone] or if no answer [phone]

## 2020-10-30 NOTE — Progress Notes (Signed)
PHARMACY - TOTAL PARENTERAL NUTRITION CONSULT NOTE  Indication: Perforated duodenum  Patient Measurements: Weight: 58.8 kg (129 lb 10.1 oz)   Body mass index is 20.3 kg/m. Usual Weight: 120-125 lbs  Assessment:  64 YOM presented on 10/01/2020 with right MCA occlusion and underwent revascularization. PMH significant for PUD post partial gastrectomy in 1990. Patient was started on a dysphagia diet on 10/08/20 and also transferred out of the ICU. Post-op course complicated by Afib RVR, aspiration PNA/cellulitis and AKI. He was then started on TF on 10/12/20, which ended on 10/13/20 when CT showed perinephric abscess with gas formation. Drain was placed and there is concern for fistulous communication to the colon on 10/16/20 CT. Nutrition has been inadequate since admission, and Pharmacy consulted for TPN management. Per patient's son, patient has no recent weight loss and was eating a balanced diet PTA.  TPN stopped 11/29 afternoon s/p code d/t access issues, now to resume 11/30.  Glucose / Insulin: no hx DM - CBGs 70-80s off TPN Electrolytes: K 4.7 (goal >4 with Afib), Mag 1.9  (2g ordered), Na 141, Phos 2.9 (11/29), Bicarb gtt to dec Renal: SCr 1.46>>2.71 s/p code LFTs / TGs:  AST/ALT downtrend 89/124, Tbili 2.1>1.3, TG wnl Prealbumin / albumin: albumin 1.1>1.4, prealbumin up 15.3c Intake / Output; MIVF: UOP down to 0.7 ml/kg/hr, drain output 79mL, chest tube output inc to 60 mL, NG 125 mL out GI Imaging: 11/20 CT - large IA fluid and blood in LUQ worrisome for active bleeding, renal cysts, cannot exclude fistulous communication with large bowel  NGT to be placed for CT w/contrast 11/29 >> now with bleed (stomach, retroperitoneal), decreased abscess size with drain  Surgeries / Procedures: 11/20 - intubated, thoracentesis -- drained cloudy, yellow appearing fluid 11/22 - VATS/decortication 11/29: Re-intubated  Central access: PICC 10/17/20 TPN start date: 10/17/20  Nutritional  Goals (per RD rec on 11/23): 1950 - 2240 kCal, 115 - 130g protein; >1.8L fluid per day  Goal TPN rate is 90 ml/hr - will provide 122g protein, 292g CHO, and 67g SMOF lipids, for total 2150 kCal  Current Nutrition:  TPN; NPO  Plan:  Restart TPN at goal rate 90 mL/hr at 1800 Electrolytes in TPN: Dec Na/K, add back Ca,(1:2 KC:[id number] to Max acetate) Continue standard MVI and trace elements to TPN Continue ICU resistant scale SSI, will not add insulin to bag on re-start and adjust as needed based on 24h CBGs F/u surg plan Watch volume status CMP/Mg/Phos with AM labs  Daylene Posey, PharmD Clinical Pharmacist Please check AMION for all [facility] Pharmacy numbers 10/30/2020 9:01 AM

## 2020-10-30 NOTE — Progress Notes (Signed)
Pt transported form 3M09 to CT and back with no complications.

## 2020-10-30 NOTE — Progress Notes (Signed)
Patient ID: Mark May, male   DOB: Dec 18, 1955, 64 y.o.   MRN: [id number] I discussed with his son his entire hospital course and where we are currently. We discussed his perforation and the difficulty in repairing that.  I am not exactly sure where he is bleeding although this appears possibly related to the drain and from perforation. I think if he continues to bleed that a repeat egd to ensure this is the case would be reasonable.  There isnt convincing evidence for 100% that it is not something that can be treated endoscopically.  The next step would be IR for embolization but this certainly carries risk with contrast load given Cr of 3.  Surgically I am not sure what the plan would be for the bleeding as I am not 100% sure where the bleeding is coming from.  His heparin is off and he is receiving more blood now.  Will get repeat labs again after that.   Surgery for the perforation is not indicated at all right now. I discussed with his son the magnitude of surgery and how it would be performed. He had a misconception there was something easy to fix this perforation which is not the case for a posterior D2 perforation with his prior surgery.  I explained this in detail to him today and answered all of his questions. This appears to be adequately managed with drains right now.  It is not that surgery is not an option it is not a good option for him at this time given overall picture.  There is certainly a role for surgery if this area does not heal down the road.

## 2020-10-31 ENCOUNTER — Inpatient Hospital Stay: Payer: Self-pay

## 2020-10-31 ENCOUNTER — Inpatient Hospital Stay (HOSPITAL_COMMUNITY): Payer: 59

## 2020-10-31 DIAGNOSIS — Z66 Do not resuscitate: Secondary | ICD-10-CM | POA: Diagnosis not present

## 2020-10-31 DIAGNOSIS — I63311 Cerebral infarction due to thrombosis of right middle cerebral artery: Secondary | ICD-10-CM | POA: Diagnosis not present

## 2020-10-31 DIAGNOSIS — Z515 Encounter for palliative care: Secondary | ICD-10-CM | POA: Diagnosis not present

## 2020-10-31 DIAGNOSIS — J9601 Acute respiratory failure with hypoxia: Secondary | ICD-10-CM

## 2020-10-31 DIAGNOSIS — I6601 Occlusion and stenosis of right middle cerebral artery: Secondary | ICD-10-CM | POA: Diagnosis not present

## 2020-10-31 DIAGNOSIS — N179 Acute kidney failure, unspecified: Secondary | ICD-10-CM | POA: Diagnosis not present

## 2020-10-31 DIAGNOSIS — Z20822 Contact with and (suspected) exposure to covid-19: Secondary | ICD-10-CM | POA: Diagnosis not present

## 2020-10-31 DIAGNOSIS — A419 Sepsis, unspecified organism: Secondary | ICD-10-CM | POA: Diagnosis not present

## 2020-10-31 LAB — POCT I-STAT 7, (LYTES, BLD GAS, ICA,H+H)
Acid-Base Excess: 0 mmol/L (ref 0.0–2.0)
Bicarbonate: 21.1 mmol/L (ref 20.0–28.0)
Calcium, Ion: 0.79 mmol/L — CL (ref 1.15–1.40)
HCT: 36 % — ABNORMAL LOW (ref 39.0–52.0)
Hemoglobin: 12.2 g/dL — ABNORMAL LOW (ref 13.0–17.0)
O2 Saturation: 96 %
Patient temperature: 98.9
Potassium: 4.3 mmol/L (ref 3.5–5.1)
Sodium: 140 mmol/L (ref 135–145)
TCO2: 22 mmol/L (ref 22–32)
pCO2 arterial: 24.7 mmHg — ABNORMAL LOW (ref 32.0–48.0)
pH, Arterial: 7.539 — ABNORMAL HIGH (ref 7.350–7.450)
pO2, Arterial: 70 mmHg — ABNORMAL LOW (ref 83.0–108.0)

## 2020-10-31 LAB — PREPARE FRESH FROZEN PLASMA
Unit division: 0
Unit division: 0
Unit division: 0
Unit division: 0

## 2020-10-31 LAB — CBC
HCT: 26.3 % — ABNORMAL LOW (ref 39.0–52.0)
HCT: 26.5 % — ABNORMAL LOW (ref 39.0–52.0)
Hemoglobin: 9.2 g/dL — ABNORMAL LOW (ref 13.0–17.0)
Hemoglobin: 8.8 g/dL — ABNORMAL LOW (ref 13.0–17.0)
MCH: 28.8 pg (ref 26.0–34.0)
MCH: 28.9 pg (ref 26.0–34.0)
MCHC: 35 g/dL (ref 30.0–36.0)
MCHC: 33.2 g/dL (ref 30.0–36.0)
MCV: 82.4 fL (ref 80.0–100.0)
MCV: 86.9 fL (ref 80.0–100.0)
Platelets: 147 10*3/uL — ABNORMAL LOW (ref 150–400)
Platelets: 141 10*3/uL — ABNORMAL LOW (ref 150–400)
RBC: 3.19 MIL/uL — ABNORMAL LOW (ref 4.22–5.81)
RBC: 3.05 MIL/uL — ABNORMAL LOW (ref 4.22–5.81)
RDW: 16.2 % — ABNORMAL HIGH (ref 11.5–15.5)
RDW: 17.3 % — ABNORMAL HIGH (ref 11.5–15.5)
WBC: 19.8 10*3/uL — ABNORMAL HIGH (ref 4.0–10.5)
WBC: 14.6 10*3/uL — ABNORMAL HIGH (ref 4.0–10.5)
nRBC: 2.1 % — ABNORMAL HIGH (ref 0.0–0.2)
nRBC: 1.5 % — ABNORMAL HIGH (ref 0.0–0.2)

## 2020-10-31 LAB — COMPREHENSIVE METABOLIC PANEL
ALT: 1447 U/L — ABNORMAL HIGH (ref 0–44)
AST: 2275 U/L — ABNORMAL HIGH (ref 15–41)
Albumin: 1.6 g/dL — ABNORMAL LOW (ref 3.5–5.0)
Alkaline Phosphatase: 185 U/L — ABNORMAL HIGH (ref 38–126)
Anion gap: 27 — ABNORMAL HIGH (ref 5–15)
BUN: 91 mg/dL — ABNORMAL HIGH (ref 8–23)
CO2: 23 mmol/L (ref 22–32)
Calcium: 6.4 mg/dL — CL (ref 8.9–10.3)
Chloride: 95 mmol/L — ABNORMAL LOW (ref 98–111)
Creatinine, Ser: 3.94 mg/dL — ABNORMAL HIGH (ref 0.61–1.24)
GFR, Estimated: 16 mL/min — ABNORMAL LOW (ref >60)
Glucose, Bld: 74 mg/dL (ref 70–99)
Potassium: 4.4 mmol/L (ref 3.5–5.1)
Sodium: 145 mmol/L (ref 135–145)
Total Bilirubin: 5.8 mg/dL — ABNORMAL HIGH (ref 0.3–1.2)
Total Protein: 4.9 g/dL — ABNORMAL LOW (ref 6.5–8.1)

## 2020-10-31 LAB — BASIC METABOLIC PANEL
Anion gap: 31 — ABNORMAL HIGH (ref 5–15)
BUN: 95 mg/dL — ABNORMAL HIGH (ref 8–23)
CO2: 19 mmol/L — ABNORMAL LOW (ref 22–32)
Calcium: 6.3 mg/dL — CL (ref 8.9–10.3)
Chloride: 95 mmol/L — ABNORMAL LOW (ref 98–111)
Creatinine, Ser: 4.38 mg/dL — ABNORMAL HIGH (ref 0.61–1.24)
GFR, Estimated: 14 mL/min — ABNORMAL LOW (ref >60)
Glucose, Bld: 75 mg/dL (ref 70–99)
Potassium: 4.3 mmol/L (ref 3.5–5.1)
Sodium: 145 mmol/L (ref 135–145)

## 2020-10-31 LAB — BPAM FFP
Blood Product Expiration Date: 202112042359
Blood Product Expiration Date: 202112042359
Blood Product Expiration Date: 202112052359
Blood Product Expiration Date: 202112052359
ISSUE DATE / TIME: 202111300301
ISSUE DATE / TIME: 202111300301
ISSUE DATE / TIME: 202111300301
ISSUE DATE / TIME: 202111300301
Unit Type and Rh: 7300
Unit Type and Rh: 7300
Unit Type and Rh: 7300
Unit Type and Rh: 7300

## 2020-10-31 LAB — GLUCOSE, CAPILLARY
Glucose-Capillary: 102 mg/dL — ABNORMAL HIGH (ref 70–99)
Glucose-Capillary: 130 mg/dL — ABNORMAL HIGH (ref 70–99)
Glucose-Capillary: 57 mg/dL — ABNORMAL LOW (ref 70–99)
Glucose-Capillary: 58 mg/dL — ABNORMAL LOW (ref 70–99)
Glucose-Capillary: 64 mg/dL — ABNORMAL LOW (ref 70–99)
Glucose-Capillary: 75 mg/dL (ref 70–99)
Glucose-Capillary: 98 mg/dL (ref 70–99)

## 2020-10-31 LAB — PHOSPHORUS: Phosphorus: 5.9 mg/dL — ABNORMAL HIGH (ref 2.5–4.6)

## 2020-10-31 LAB — LACTIC ACID, PLASMA
Lactic Acid, Venous: 8.2 mmol/L (ref 0.5–1.9)
Lactic Acid, Venous: 10.8 mmol/L (ref 0.5–1.9)

## 2020-10-31 LAB — POCT ACTIVATED CLOTTING TIME: Activated Clotting Time: 0 seconds

## 2020-10-31 LAB — MAGNESIUM: Magnesium: 2.6 mg/dL — ABNORMAL HIGH (ref 1.7–2.4)

## 2020-10-31 MED ORDER — AMIODARONE LOAD VIA INFUSION
150.0000 mg | Freq: Once | INTRAVENOUS | Status: AC
  Administered 2020-10-31: 150 mg via INTRAVENOUS
  Filled 2020-10-31: qty 83.34

## 2020-10-31 MED ORDER — CALCIUM GLUCONATE-NACL 1-0.675 GM/50ML-% IV SOLN
1.0000 g | Freq: Once | INTRAVENOUS | Status: AC
  Administered 2020-10-31: 1000 mg via INTRAVENOUS
  Filled 2020-10-31: qty 50

## 2020-10-31 MED ORDER — DEXTROSE 10 % IV SOLN: INTRAVENOUS | Status: AC

## 2020-10-31 MED ORDER — SODIUM CHLORIDE 0.9 % IV BOLUS
1000.0000 mL | Freq: Once | INTRAVENOUS | Status: AC
  Administered 2020-10-31: 1000 mL via INTRAVENOUS

## 2020-10-31 MED ORDER — AMIODARONE HCL IN DEXTROSE 360-4.14 MG/200ML-% IV SOLN
60.0000 mg/h | INTRAVENOUS | Status: AC
  Administered 2020-10-31 (×2): 60 mg/h via INTRAVENOUS
  Filled 2020-10-31 (×2): qty 200

## 2020-10-31 MED ORDER — AMIODARONE HCL IN DEXTROSE 360-4.14 MG/200ML-% IV SOLN: 30.0000 mg/h | INTRAVENOUS | Status: DC

## 2020-10-31 MED ORDER — ZINC CHLORIDE 1 MG/ML IV SOLN
INTRAVENOUS | Status: AC
  Filled 2020-10-31: qty 1220.4

## 2020-10-31 MED ORDER — SODIUM CHLORIDE 0.9% FLUSH
10.0000 mL | Freq: Two times a day (BID) | INTRAVENOUS | Status: DC
  Administered 2020-10-31 – 2020-11-04 (×6): 10 mL

## 2020-10-31 MED ORDER — SODIUM CHLORIDE 0.9% FLUSH
10.0000 mL | INTRAVENOUS | Status: DC | PRN
  Administered 2020-10-31: 10 mL

## 2020-10-31 MED ORDER — PIPERACILLIN-TAZOBACTAM IN DEX 2-0.25 GM/50ML IV SOLN
2.2500 g | Freq: Three times a day (TID) | INTRAVENOUS | Status: DC
  Administered 2020-10-31 – 2020-11-02 (×7): 2.25 g via INTRAVENOUS
  Filled 2020-10-31 (×10): qty 50

## 2020-10-31 MED FILL — Medication: Qty: 1 | Status: AC

## 2020-10-31 NOTE — Progress Notes (Signed)
Peripherally Inserted Central Catheter Placement  The IV Nurse has discussed with the patient and/or persons authorized to consent for the patient, the purpose of this procedure and the potential benefits and risks involved with this procedure.  The benefits include less needle sticks, lab draws from the catheter, and the patient may be discharged home with the catheter. Risks include, but not limited to, infection, bleeding, blood clot (thrombus formation), and puncture of an artery; nerve damage and irregular heartbeat and possibility to perform a PICC exchange if needed/ordered by physician.  Alternatives to this procedure were also discussed.  Bard Power PICC patient education guide, fact sheet on infection prevention and patient information card has been provided to patient /or left at bedside.  PICC consent previously signed on 10-17-20 by son.  Daughter at bedside and aware of procedure, patient is intubated with altered mental status.  PICC Placement Documentation  PICC Triple Lumen 10/31/20 PICC Right Brachial 37 cm 0 cm (Active)  Indication for Insertion or Continuance of Line Administration of hyperosmolar/irritating solutions (i.e. TPN, Vancomycin, etc.) 10/31/20 1509  Exposed Catheter (cm) 0 cm 10/31/20 1509  Site Assessment Clean;Dry;Intact 10/31/20 1509  Lumen #1 Status Flushed;Saline locked;Blood return noted 10/31/20 1509  Lumen #2 Status Flushed;Saline locked;Blood return noted 10/31/20 1509  Lumen #3 Status Saline locked;Flushed;Blood return noted 10/31/20 1509  Dressing Type Transparent 10/31/20 1509  Dressing Status Clean;Dry;Intact 10/31/20 1509  Antimicrobial disc in place? Yes 10/31/20 1509  Safety Lock Not Applicable 10/31/20 1509  Line Care Connections checked and tightened 10/31/20 1509  Line Adjustment (NICU/IV Team Only) No 10/31/20 1509  Dressing Intervention New dressing 10/31/20 1509  Dressing Change Due 11/07/20 10/31/20 1509       Sharicka Pogorzelski, Lajean Manes 10/31/2020, 3:10 PM

## 2020-10-31 NOTE — Progress Notes (Signed)
Afternoon BMP reviewed, rising BUN / sr cr with worsening acidosis despite bicarb gtt.  Nephrology consulted for evaluation of oliguric renal failure post arrest (64ml total UOP for day), ABLA.     Canary Brim, MSN, NP-C, AGACNP-BC [facility] 10/31/2020, 3:59 PM   Please see [email or web address] for pager details.

## 2020-10-31 NOTE — Progress Notes (Signed)
eLink Physician-Brief Progress Note Patient Name: Mark May DOB: 08/05/1956 MRN: [id number]   Date of Service  10/31/2020  HPI/Events of Note  Oliguria - Urine output 35 mL for shift. Bladder scan reveals only 20 mL. BP = 107/64 with MAP = 78%. LVEF = 70-75%. Creatinine = 3.57. Ventilator settings 40%/P 5.  eICU Interventions  Plan: 1. Bolus with 0.9 NaCl 1 liter IV over 1 hour now.      Intervention Category Major Interventions: Other:  [name] 10/31/2020, 3:56 AM

## 2020-10-31 NOTE — Progress Notes (Addendum)
NAME:  Mark May, MRN:  [id number], DOB:  Nov 26, 1956, LOS: 26 ADMISSION DATE:  10/04/2020, CONSULTATION DATE: 10/04/2020 REFERRING MD: Dr. Curtis Sites, CHIEF COMPLAINT: Left-sided weakness  Brief History   64 year old male with HTN and HLD who presented with left-sided weakness, noted to have right MCA occlusion status post thrombectomy and stent placement 11/5.  Hospitalization complicated by hypoxic respiratory failure secondary to pulmonary edema requiring BiPAP and lasix.  Awaiting CIR placement however, on the evening of 11/10, he was noted to become more lethargic with tachycardia and tachypnea.  Had complained of abdominal pain with some distention and chest pain which resolved after burping.  KUB obtained which was normal.  On 11/11, he was somewhat more responsive but now with increasing sCr in which lisinopril was stopped and development of fever 102.6  TRH initially consulted for help with medical management however on their evaluation, PCCM consulted given ill appearance and developing hypotension and new onset Afib with RVR found to have sepsis with RLL pneumonia likely due to aspiration started on zosyn. PCCM consulted for continue right abdominal pain and distension with concern for bowel perforation secondary to Cortrak.  On 11/13, CT abd/ pelvis showed large right perinephric fluid collection and pockets of air concerning for an infectious process/abscess. Underwent CT guided drain placement of right retroperitoneal abscess.  On 11/15, he developed melena with anemia.  GI consulted with plans to medically manage and deferred EGD.  Surgery was consulted on 11/17 with imaging concerning for possible contained bowel perforation.  On 11/20, he developed respiratory distress requiring intubation. Underwent thoracentesis on 11/20.  Hypotensive with Hgb drob on 11/21, CT ABD/Pelvis w/ concern for active RP bleed.  Subsequently, developed hemothorax requiring OR for VATS on 11/22. Developed  progressive hypotension 11/29 and suffered cardiac arrest in setting of RP bleed requiring intubation.   Past Medical History  Hypertension Peptic ulcer disease, previous partial gastrectomy (in [place] / 1990) Gout BPH Sciatica   Significant Hospital Events   11/05 Mechanical thrombectomy of right MCA and stent 11/5 11/11 PCCM reconsulted  11/12 Cortrak placed 11/14 CT guided drainage for R RP abscess with JP 11/17 PCCM reconsulted 11/18 CCM recalled by rapid response, fever, resp distress, CXR new left infiltrates with left pleural effusion 11/20 Thoracentesis  11/21 Hypotensive with Hgb drop to 6.1 s/p 2 units PRBC, CT ABD with concerning for active bleeding 11/22 To OR for VATS, decortication  11/29 Hypotension, cardiac arrest x20 min, VDRF, profound acidosis due to new retroperitoneal bleed, severe anemia 11/29 EGD >> blood in all areas of the gastric remnant with no clearly defined source of bleeding  Consults:  IR Neurology PCCM 11/5-11/8; 11/11; 11/16-11/17, 11/18  [facility] Urology 11/13 GI 11/15 CCS 11/17  Procedures:  R Femoral Sheath 11/6 >> 11/6 ETT 11/20 >> 11/26 RUE PICC 11/17 >>  ETT 11/29 >>   Significant Diagnostic Tests:  11/5 CT head >> No acute abnormality.  11/5 CTA head / neck >> Severe distal right M1 stenosis or subocclusive thrombus, Right MCA infarct with extensive penumbra, 4 mm right supraclinoid ICA aneurysm. Widely patent cervical carotid and vertebral arteries. 11/5 Postprocedure CT head >> Hypodensity in the right lateral temporal lobe now shows mild hemorrhage or contrast enhancement. This is most consistent with acute infarct. There has been interval stenting of the right middle cerebral artery. 11/6 MRI brain >> Fairly extensive patchy acute/early subacute infarcts within the right MCA/watershed territory.  Additional small acute/early subacute infarcts within the dorsal right thalamus and  left basal ganglia. 11/6  MRA head >>  Interval stenting  of the M1/M2 right middle cerebral artery. Flow related signal is present proximal and distal to the stent suggestive of stent patency. 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 11/6 TTE >> LVEF 70-75%, no RWMA, Grade II diastolic dysfunction, elevated LA pressure 11/13 CT ABD/Pelvis >> large right perinephric fluid collection and pockets of air concerning for an infectious process/abscess. Cholelithiasis. Nonobstructing right renal calculi. No hydronephrosis. Colonic diverticulosis. No bowel obstruction. Partially visualized small bilateral pleural effusions with complete consolidative changes of the visualized lower lobes. 11/16 CT ABD/Pelvis >> interval placement of drainage catheter into the right perinephric space in the area of previously seen gas and fluid collection. Fluid collection has decreased since prior study. There is now contrast material seen within the perinephric space and extending into the right paracolic gutter. This is concerning for possible fistulous communication to the colon. May consider contrast injection through the drainage catheter under fluoroscopic guidance to assess for enteric fistula. Moderate bilateral pleural effusions with bibasilar atelectasis or consolidation, unchanged.  Cholelithiasis.  NG tube in the stomach.  Aortic atherosclerosis.  Small to moderate free fluid in the pelvis. 11/20 L Thoracentesis >> 650 ml cloudy yellow fluid 11/21 CT abd/ pelvis >> very large area of intra-abdominal fluid and blood within the left upper quadrant, with an appearance worrisome for active bleeding.  Stable position of the right-sided percutaneous drainage catheter with a moderate amount of surrounding contained free air, fluid and inflammatory fat stranding. Fistulous communication with the adjacent portion of large bowel cannot be excluded. Moderate to marked amount of para muscular subcutaneous inflammatory fat stranding along the lateral  aspects of the left abdominal and pelvic walls. 11/30 CT abdomen pelvis chest >> RLQ percutaneous drainage catheter in place, decreased size abscess cavity. This is adjacent to the distal second portion of the duodenum but no evidence of extravasation of contrast.  Right retroperitoneal hematoma along the course of the drainage catheter 4.9 x 19.9 x 5.4 cm (right psoas, iliacus).  Left greater than right pleural effusions with a left-sided chest tube in place, left extrapleural collection.  Micro Data:  MRSA PCR 11/5 >> negative COVID 11/5 >> negative Flu 11/5 >> negative UC 11/10 >> neg BCx2 11/11 >> neg BC x2 11/13 >> neg Right RRP abscess 11/14 >> moderate candida, klebsiella >> R-ampicillin, S-cefazolin  BCx 2 11/19 >> negative  Left pleural fluid 11/20 >> Klebsiella >> R-ampicillin, I-unasyn, S-cefazolin, ceftriaxone, gent, imipenem  Antimicrobials:  Cefazolin 11/5 >> 11/6 Flagyl 11/11 x1 Cefepime 11/11, 11/18 >> 11/19  Vanco 11/12 >> 11/16, 11/18  Unasyn 11/17 >> 11/18  Flagyl 11/18 >> 11/19  Diflucan 11/17 >>  Zosyn 11/11 >> 11/17, 11/19 >>   Interim history/subjective:  Afebrile / WBC down to 19.8 (from 42.5) Glucose range 58-98 I/O 980 ml UOP, +4.6L in last 24 hours  RN reports patient is still off TPN, needs additional access   Objective   Blood pressure 120/69, pulse (!) 107, temperature 98.9 F (37.2 C), temperature source Axillary, resp. rate (!) 30, weight 62.4 kg, SpO2 97 %. CVP:  [4 mmHg-5 mmHg] 5 mmHg  Vent Mode: PRVC FiO2 (%):  [40 %-50 %] 40 % Set Rate:  [30 bmp] 30 bmp Vt Set:  [530 mL] 530 mL PEEP:  [5 cmH20] 5 cmH20 Plateau Pressure:  [19 cmH20-25 cmH20] 19 cmH20   Intake/Output Summary (Last 24 hours) at 10/31/2020 0930 Last data filed at  10/31/2020 0600 Gross per 24 hour  Intake 4180.5 ml  Output 85 ml  Net 4095.5 ml   Filed Weights   10/26/20 0500 10/30/20 0500 10/31/20 0440  Weight: 56.1 kg 58.8 kg 62.4 kg    Physical Exam General: thin,  ill appearing adult male lying in bed on vent in NAD  HEENT: MM pink/moist, ETT, old bloody secretions in NGT Neuro: no response to voice, no spontaneous movements noted, minimal grimace with painful stimulation CV: s1s2 RRR, no m/r/g PULM: non-labored on vent, lungs bilaterally clear, left chest tube in place without air leak / on suction GI: abd somewhat firm, R sided drain in place with brown fluid, hypoactive bowel sounds Extremities: warm/dry, trace generalized edema  Skin: no rashes or lesions on exposed skin   CXR 12/1 >> images personally reviewed, ETT in good position, chest tube in good position on right with small residual effusion, ? Retrocardiac atelectasis  Resolved Hospital Problems:  Hypernatremia  Assessment & Plan:    Acute respiratory failure secondary to aspiration pneumonia with bilateral pleural effusion, left klebsiella empyema, ? Connection with abdominal process Hemothorax following thoracentesis, s/p VATS 11/22. No active bleeding after clot removal. Recurrent acute respiratory failure in setting cardiac arrest, acidosis, retroperitoneal bleed S/p thora on 11/20 with exudative properties, klebsiella in pleural culture & RP abscess.  s/post thora extravasation on CTA chest. Cardiac arrest 11/29, reintubated in setting of RP bleed. -PRVC 8cc/kg, rate 24 given acidosis -wean PEEP / FiO2 for sats >90% -follow intermittent CXR  -continue chest tube to -20 cm suction, followed up with TCTS, plan for removal 12/2 -not candidate for weaning 12/1   Mixed Shock, Septic + ABLA / Hypovolemic, RLL PNA, Intra-Abdominal Abscess.   Recurrent on 11/29 due to acute increased retroperitoneal bleeding and also upper GI bleeding.  Resulting cardiac arrest, CPR 20 minutes.  Initial shock multifactorial, ABLA and septic. S/p 2 units blood on 11/21.  Klebsiella Empyema + fluid collection in abd. Recurrent episode 11/29 appears to be more likely due to acute blood loss Suspected  aspiration and intraabdominal abscess (klebsiella and candida) in setting of bowel perforation, left empyema.  No clear evidence for connection between the intra-abdominal process and pleural space -continue diflucan (started 11/17), zosyn (started 11/19), plan for minimum 14 days therapy  -wean vasopressors for MAP >65  -continue protonix infusion, no clear gastric or duodenal source identified on EGD 11/29  Intra-abdominal/retroperitoneal abscess with Contained Bowel Perforation.   Hx of PUD, Partial Gastrectomy No clear persistent connection between duodenum and abscess based on CT 11/29 -Appreciate CCS assistance  -abx, antifungals as above   Retroperitoneal Bleed / Hematoma  4.9x19.9x5.4 cm, appears source of bleeding related to drain and RP collection, not from gastric or duodenal source -hold anticoagulation  -monitor drainage from NGT, abd drain   Severe lactic acidosis 11/29 -follow up lactate  -continue bicarbonate infusion   AKI Progressive following arrest 11/29 -Trend BMP / urinary output -Replace electrolytes as indicated -Avoid nephrotoxic agents, ensure adequate renal perfusion -may need Nephrology evaluation   Hypocalcemia  -monitor, replace 12/1   PAF with RVR Currently rate controlled. Heparin infusion stopped 11/29 with acute RP bleed. 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  TPN on hold, restart when access available  Pain/Anxiety/Delirium protocol (if indicated): Intermittent Versed,  fentanyl VAP protocol (if indicated): Oral care protocol DVT prophylaxis: SCDs for now GI prophylaxis: PPI infusion Glucose control: SSI Mobility: bed rest Code Status: full code Family Communication: No family in room at time of exam. Will follow up with family on arrival. Son stepped off floor > updated in detail at 1030 am Disposition:  ICU  LABS    PULMONARY Recent Labs  Lab 10/20/2020 1737 10/13/2020 2055  PHART 7.198* 7.332*  PCO2ART 43.7 25.8*  PO2ART 400* 170*  HCO3 17.0* 13.4*  TCO2 18*  --   O2SAT 100.0 98.8   CBC Recent Labs  Lab 10/30/20 0948 10/30/20 1912 10/31/20 0214  HGB 6.3* 9.2* 9.2*  HCT 18.6* 26.8* 26.3*  WBC 28.5* 23.6* 19.8*  PLT 140* 148* 147*   COAGULATION Recent Labs  Lab 10/16/2020 2100  INR 2.7*   CARDIAC  No results for input(s): TROPONINI in the last 168 hours. No results for input(s): PROBNP in the last 168 hours.  CHEMISTRY Recent Labs  Lab 10/25/20 0741 10/25/20 0741 10/26/20 0500 10/26/20 0500 10/27/20 0443 10/27/20 0620 10/28/20 0629 10/28/20 0629 10/19/2020 0529 10/23/2020 0529 10/21/2020 1628 10/18/2020 1737 10/06/2020 2100 10/14/2020 2100 10/30/20 0339 10/30/20 0339 10/30/20 0948 10/30/20 0948 10/30/20 1950 10/31/20 0231  NA 142   < > 144   < > 143   < > 149*   < > 147*   < > 147*   < > 145  --  141  --  143  --  144 145  K 3.5   < > 2.8*   < > 3.8   < > 3.8   < > 4.0   < > 7.1*   < > 5.1   < > 4.7   < > 4.7   < > 5.0 4.4  CL 109   < > 106   < > 104   < > 112*   < > 111   < > 114*   < > 101  --  99  --  96*  --  94* 95*  CO2 21*   < > 24   < > 26   < > 26   < > 24   < > 12*   < > 15*  --  20*  --  21*  --  24 23  GLUCOSE 130*   < > 165*   < > 433*   < > 123*   < > 175*   < > 236*   < > 265*  --  93  --  91  --  78 74  BUN 77*   < > 80*   < > 66*   < > 62*   < > 56*   < > 60*   < > 63*  --  68*  --  75*  --  87* 91*  CREATININE 2.67*   < > 2.49*   < > 1.83*   < > 1.69*   < > 1.46*   < > 1.94*   < > 2.41*  --  2.71*  --   3.04*  --  3.57* 3.94*  CALCIUM 7.5*   < > 7.8*   < >  7.7*   < > 8.0*   < > 7.8*   < > 8.2*   < > 6.4*  --  6.5*  --  6.5*  --  6.2* 6.4*  MG 2.1   < > 2.0   < >  --   --  1.9  --  2.5*  --  3.4*  --   --   --  1.9  --   --   --   --  2.6*  PHOS 2.4*  --  2.9  --  3.0  --   --   --  2.9  --   --   --   --   --   --   --   --   --   --  5.9*   < > = values in this interval not displayed.   Estimated Creatinine Clearance: 16.5 mL/min (A) (by C-G formula based on SCr of 3.94 mg/dL (H)).  LIVER Recent Labs  Lab 10/25/20 0213 10/25/20 0741 10/26/20 0500 10/02/2020 0529 10/02/2020 2100 10/31/20 0231  AST 126* 103* 67* 89*  --  PENDING  ALT 201* 184* 128* 124*  --  1,447*  ALKPHOS 74 83 90 206*  --  185*  BILITOT 1.7* 2.1* 2.1* 1.3*  --  5.8*  PROT 4.6* 5.1* 5.3* 5.9*  --  4.9*  ALBUMIN 1.1* 1.1* 1.1* 1.4*  --  1.6*  INR  --   --   --   --  2.7*  --    INFECTIOUS Recent Labs  Lab 10/18/2020 2100 10/30/20 0948 10/31/20 0237  LATICACIDVEN >11.0* >11.0* 8.2*   ENDOCRINE CBG (last 3)  Recent Labs    10/30/20 2320 10/31/20 0339 10/31/20 0746  GLUCAP 81 58* 98    Critical Care Time: 40 minutes    Canary BrimBrandi Trentan Trippe, MSN, NP-C, AGACNP-BC [facility] 10/31/2020, 9:30 AM   Please see [email or web address] for pager details.

## 2020-10-31 NOTE — Progress Notes (Signed)
[facility] Gastroenterology Progress Note  Mark May 64 y.o. Jan 05, 1956   Subjective: Hgb stable at 9.2. Melena. Intubated. Son outside room.  Objective: Vital signs: Vitals:   10/31/20 0600 10/31/20 0800  BP: 120/69   Pulse: (!) 107   Resp: (!) 30   Temp:  98.9 F (37.2 C)  SpO2: 97%     Physical Exam: Gen: intubated, ill-appearing, thin  CV: Tachycardic Chest: Coarse breath sounds Abd: +distention, nontender, decreased bowel sounds Ext: no edema  Lab Results: Recent Labs    10/27/2020 0529 10/26/2020 1628 10/30/20 0339 10/30/20 0948 10/30/20 1950 10/31/20 0231  NA 147*   < > 141   < > 144 145  K 4.0   < > 4.7   < > 5.0 4.4  CL 111   < > 99   < > 94* 95*  CO2 24   < > 20*   < > 24 23  GLUCOSE 175*   < > 93   < > 78 74  BUN 56*   < > 68*   < > 87* 91*  CREATININE 1.46*   < > 2.71*   < > 3.57* 3.94*  CALCIUM 7.8*   < > 6.5*   < > 6.2* 6.4*  MG 2.5*   < > 1.9  --   --  2.6*  PHOS 2.9  --   --   --   --  5.9*   < > = values in this interval not displayed.   Recent Labs    10/01/2020 0529 10/31/20 0231  AST 89* PENDING  ALT 124* 1,447*  ALKPHOS 206* 185*  BILITOT 1.3* 5.8*  PROT 5.9* 4.9*  ALBUMIN 1.4* 1.6*   Recent Labs    10/28/20 1537 10/28/20 1537 10/28/2020 0529 10/30/2020 1735 10/30/20 1912 10/31/20 0214  WBC 9.6   < > 12.5*   < > 23.6* 19.8*  NEUTROABS 7.6  --  10.0*  --   --   --   HGB 8.0*   < > 8.2*   < > 9.2* 9.2*  HCT 24.7*   < > 26.0*   < > 26.8* 26.3*  MCV 86.7   < > 87.5   < > 83.2 82.4  PLT 268   < > 303   < > 148* 147*   < > = values in this interval not displayed.   Minimal output from retroperitoneal drain   Assessment/Plan: Retroperitoneal hematoma with minimal drain output. Hgb stable at 9.2. On broad spectrum antibiotics for Klebisella empyema. Pressors being weaned. Due to risk of worsening a duodenal perforation from insufflation would not recommend a repeat EGD in the near future unless surgery feels findings will change their  recommended management. Continue Protonix drip.   Shirley Friar 10/31/2020, 9:27 AM  Questions please call [phone] ID: Mark May, male   DOB: 01-14-1956, 64 y.o.   MRN: [id number]

## 2020-10-31 NOTE — Progress Notes (Signed)
2 Days Post-Op   Subjective/Chief Complaint: Hg stable overnight, lactate better, on levo still   Objective: Vital signs in last 24 hours: Temp:  [97.9 F (36.6 C)-101.3 F (38.5 C)] 100.1 F (37.8 C) (12/01 0346) Pulse Rate:  [49-137] 107 (12/01 0600) Resp:  [24-40] 30 (12/01 0600) BP: (77-147)/(45-95) 120/69 (12/01 0600) SpO2:  [93 %-100 %] 97 % (12/01 0600) FiO2 (%):  [40 %-50 %] 40 % (12/01 0349) Weight:  [62.4 kg] 62.4 kg (12/01 0440) Last BM Date: 10/30/20  Intake/Output from previous day: 11/30 0701 - 12/01 0700 In: 7415.8 [I.V.:5954.1; Blood:321; IV Piggyback:1140.7] Out: 85 [Urine:75; Chest Tube:10] Intake/Output this shift: No intake/output data recorded.  General nad, intubated, sedated cv tachy Lungs coarse abd well healed upper midline scar, mild distended nontender, right sided drain with old blood  Lab Results:  Recent Labs    10/30/20 1912 10/31/20 0214  WBC 23.6* 19.8*  HGB 9.2* 9.2*  HCT 26.8* 26.3*  PLT 148* 147*   BMET Recent Labs    10/30/20 1950 10/31/20 0231  NA 144 145  K 5.0 4.4  CL 94* 95*  CO2 24 23  GLUCOSE 78 74  BUN 87* 91*  CREATININE 3.57* 3.94*  CALCIUM 6.2* 6.4*   PT/INR Recent Labs    10/15/2020 2100  LABPROT 28.1*  INR 2.7*   ABG Recent Labs    10/12/2020 1737 10/14/2020 2055  PHART 7.198* 7.332*  HCO3 17.0* 13.4*    Studies/Results: CT ABDOMEN PELVIS WO CONTRAST  Addendum Date: 10/30/2020   ADDENDUM REPORT: 10/30/2020 02:04 ADDENDUM: Critical Value/emergent results were called by telephone at the time of interpretation on 10/30/2020 at 2:03 am to Dr. Violet Baldy, Who verbally acknowledged these results. Electronically Signed   By: Deatra Robinson M.D.   On: 10/30/2020 02:04   Result Date: 10/30/2020 CLINICAL DATA:  Chest and abdominal pain.  Cardiopulmonary arrest. EXAM: CT CHEST, ABDOMEN AND PELVIS WITHOUT CONTRAST TECHNIQUE: Multidetector CT imaging of the chest, abdomen and pelvis was performed following the  standard protocol without IV contrast. COMPARISON:  None. FINDINGS: CT CHEST FINDINGS Cardiovascular: Calcific aortic atherosclerosis. Normal heart size. No pericardial effusion. Mediastinum/Nodes: No adenopathy. Esophageal catheter terminates in the stomach. Normal thyroid. Lungs/Pleura: Left-greater-than-right pleural effusions. There is a left-sided chest tube terminates near the left apex. Hazy opacities in the right upper lobe. Bibasilar atelectasis. Endotracheal tube tip is at the level of the clavicular heads. Musculoskeletal: No chest wall mass or suspicious bone lesions identified. CT ABDOMEN PELVIS FINDINGS Hepatobiliary: Unremarkable liver. Cholelithiasis without other gallbladder abnormality. Pancreas: Unremarkable. No pancreatic ductal dilatation or surrounding inflammatory changes. Spleen: Normal in size without focal abnormality. Adrenals/Urinary Tract: Normal adrenal glands. Multiple 3-4 mm calcifications at the right renal hilum. Stomach/Bowel: There is enteric contrast within distal small bowel and throughout the colon. There is no extravasated contrast visible. There is a posterior approach percutaneous right lower quadrant brain within an abscess cavity that is decreased in size. The abscess cavity is directly adjacent to the distal second portion of the duodenum. Vascular/Lymphatic: Aortic atherosclerosis. No enlarged abdominal or pelvic lymph nodes. Reproductive: Prostate is unremarkable. Other: There is a right retroperitoneal hematoma along the course of the drainage catheter. Dimensions are approximately 4.9 x 19.9 x 5.4 cm. There is intramuscular hematoma of the right psoas and iliacus muscles. Musculoskeletal: No acute or significant osseous findings. IMPRESSION: 1. Right lower quadrant percutaneous drainage catheter with decreased size of the abscess cavity. The abscess cavity is directly adjacent to the distal second portion  of the duodenum. No extravasated contrast. 2. Right  retroperitoneal hematoma along the course of the drainage catheter, measuring approximately 4.9 x 19.9 x 5.4 cm in involving the right psoas and iliacus muscles. 3. Left-greater-than-right pleural effusions with left-sided chest tube terminating near the left apex. Left-sided extrapleural collection has decreased in size. Aortic Atherosclerosis (ICD10-I70.0). Electronically Signed: By: Deatra Robinson M.D. On: 10/30/2020 01:53   CT CHEST WO CONTRAST  Addendum Date: 10/30/2020   ADDENDUM REPORT: 10/30/2020 02:04 ADDENDUM: Critical Value/emergent results were called by telephone at the time of interpretation on 10/30/2020 at 2:03 am to Dr. Violet Baldy, Who verbally acknowledged these results. Electronically Signed   By: Deatra Robinson M.D.   On: 10/30/2020 02:04   Result Date: 10/30/2020 CLINICAL DATA:  Chest and abdominal pain.  Cardiopulmonary arrest. EXAM: CT CHEST, ABDOMEN AND PELVIS WITHOUT CONTRAST TECHNIQUE: Multidetector CT imaging of the chest, abdomen and pelvis was performed following the standard protocol without IV contrast. COMPARISON:  None. FINDINGS: CT CHEST FINDINGS Cardiovascular: Calcific aortic atherosclerosis. Normal heart size. No pericardial effusion. Mediastinum/Nodes: No adenopathy. Esophageal catheter terminates in the stomach. Normal thyroid. Lungs/Pleura: Left-greater-than-right pleural effusions. There is a left-sided chest tube terminates near the left apex. Hazy opacities in the right upper lobe. Bibasilar atelectasis. Endotracheal tube tip is at the level of the clavicular heads. Musculoskeletal: No chest wall mass or suspicious bone lesions identified. CT ABDOMEN PELVIS FINDINGS Hepatobiliary: Unremarkable liver. Cholelithiasis without other gallbladder abnormality. Pancreas: Unremarkable. No pancreatic ductal dilatation or surrounding inflammatory changes. Spleen: Normal in size without focal abnormality. Adrenals/Urinary Tract: Normal adrenal glands. Multiple 3-4 mm calcifications  at the right renal hilum. Stomach/Bowel: There is enteric contrast within distal small bowel and throughout the colon. There is no extravasated contrast visible. There is a posterior approach percutaneous right lower quadrant brain within an abscess cavity that is decreased in size. The abscess cavity is directly adjacent to the distal second portion of the duodenum. Vascular/Lymphatic: Aortic atherosclerosis. No enlarged abdominal or pelvic lymph nodes. Reproductive: Prostate is unremarkable. Other: There is a right retroperitoneal hematoma along the course of the drainage catheter. Dimensions are approximately 4.9 x 19.9 x 5.4 cm. There is intramuscular hematoma of the right psoas and iliacus muscles. Musculoskeletal: No acute or significant osseous findings. IMPRESSION: 1. Right lower quadrant percutaneous drainage catheter with decreased size of the abscess cavity. The abscess cavity is directly adjacent to the distal second portion of the duodenum. No extravasated contrast. 2. Right retroperitoneal hematoma along the course of the drainage catheter, measuring approximately 4.9 x 19.9 x 5.4 cm in involving the right psoas and iliacus muscles. 3. Left-greater-than-right pleural effusions with left-sided chest tube terminating near the left apex. Left-sided extrapleural collection has decreased in size. Aortic Atherosclerosis (ICD10-I70.0). Electronically Signed: By: Deatra Robinson M.D. On: 10/30/2020 01:53   DG CHEST PORT 1 VIEW  Result Date: 10/31/2020 CLINICAL DATA:  Pleural effusion EXAM: PORTABLE CHEST 1 VIEW COMPARISON:  10/30/2020 FINDINGS: Endotracheal tube is seen 4.7 cm above the carina. Nasogastric tube extends into the upper abdomen beyond the margin of the examination. Right upper extremity PICC line with its tip within the right atrium and left large bore chest tube at the left apex are all unchanged. Lung volumes are small. There is progressive retrocardiac opacification likely representing  atelectasis or posteriorly layering pleural fluid in this location. There is stable lateral pleural thickening within the left hemithorax. There no pneumothorax. Minimal right basilar atelectasis. No pleural effusion on the right. Cardiac size within  normal limits. Pulmonary vascularity is normal. IMPRESSION: Stable support lines and tubes. Stable left lateral pleural thickening. Progressive retrocardiac opacification likely related to retrocardiac atelectasis, posterior layering small pleural fluid, or a combination there of. Electronically Signed   By: Helyn Numbers MD   On: 10/31/2020 06:23   DG CHEST PORT 1 VIEW  Result Date: 10/30/2020 CLINICAL DATA:  Pneumothorax EXAM: PORTABLE CHEST 1 VIEW COMPARISON:  10/24/2020 FINDINGS: Endotracheal tube, nasogastric tube looped within the gastric fundus, right upper extremity PICC line with its tip within the superior right atrium, mediastinal drain, and large bore left chest tube extending to the apex are all unchanged. Lung volumes are small, however, pulmonary insufflation remain stable since prior examination. Small right pleural effusion is present. Left lateral pleural thickening is unchanged. Bibasilar atelectasis is seen. No pneumothorax. Cardiac size within normal limits. No acute bone abnormality. IMPRESSION: Left chest tube in place. No pneumothorax. Stable left pleural thickening. Small right pleural effusion. Stable support lines and tubes. Electronically Signed   By: Helyn Numbers MD   On: 10/30/2020 05:17   DG CHEST PORT 1 VIEW  Result Date: 10/04/2020 CLINICAL DATA:  Cardiac arrest EXAM: PORTABLE CHEST 1 VIEW COMPARISON:  Same-day chest radiograph at 0529 hours. FINDINGS: Endotracheal tube with tip overlying the midthoracic trachea. Defibrillator leads overlie left chest/abdomen. Right upper extremity PICC with tip overlying the SVC. Left inferior approach thoracostomy tube with tip in the left lung apex, unchanged. The cardiomediastinal  silhouette is partially obscured but appears unchanged. Similar size of the loculated left pleural effusion. Stable small right pleural effusion. Low lung volumes with hypoventilatory change in the dependent lungs. IMPRESSION: Endotracheal tube with tip overlying the midthoracic trachea. Stable size of the loculated left pleural effusion and position of the left chest tube. Stable small right pleural effusion and bibasilar atelectasis. Electronically Signed   By: Maudry Mayhew MD   On: 10/11/2020 16:50   DG Abd Portable 1V  Result Date: 10/17/2020 CLINICAL DATA:  Status post NG tube placement. EXAM: PORTABLE ABDOMEN - 1 VIEW COMPARISON:  Single-view of the abdomen 10/20/2020. FINDINGS: NG tube is in place with both the tip and side-port in the stomach. Pigtail catheter right lower quadrant noted. IMPRESSION: NG tube in good position. Electronically Signed   By: Drusilla Kanner M.D.   On: 10/25/2020 12:52    Anti-infectives: Anti-infectives (From admission, onward)   Start     Dose/Rate Route Frequency Ordered Stop   10/04/2020 1400  fluconazole (DIFLUCAN) IVPB 200 mg        200 mg 100 mL/hr over 60 Minutes Intravenous Every 24 hours 10/08/2020 1148     10/19/20 1400  piperacillin-tazobactam (ZOSYN) IVPB 3.375 g        3.375 g 12.5 mL/hr over 240 Minutes Intravenous Every 8 hours 10/19/20 1150     10/19/20 0200  vancomycin (VANCOREADY) IVPB 500 mg/100 mL  Status:  Discontinued        500 mg 100 mL/hr over 60 Minutes Intravenous Every 12 hours 10/19/20 0025 10/19/20 0951   10/19/20 0200  metroNIDAZOLE (FLAGYL) IVPB 500 mg  Status:  Discontinued        500 mg 100 mL/hr over 60 Minutes Intravenous Every 8 hours 10/19/20 0045 10/19/20 1150   10/19/20 0115  ceFEPIme (MAXIPIME) 2 g in sodium chloride 0.9 % 100 mL IVPB  Status:  Discontinued        2 g 200 mL/hr over 30 Minutes Intravenous Every 12 hours 10/19/20 0025 10/19/20 1150  10/17/20 1400  Ampicillin-Sulbactam (UNASYN) 3 g in sodium chloride  0.9 % 100 mL IVPB  Status:  Discontinued        3 g 200 mL/hr over 30 Minutes Intravenous Every 6 hours 10/17/20 1207 10/18/20 2325   10/17/20 1400  fluconazole (DIFLUCAN) IVPB 400 mg  Status:  Discontinued        400 mg 100 mL/hr over 120 Minutes Intravenous Every 24 hours 10/17/20 1207 10/11/2020 1148   10/15/20 1630  vancomycin (VANCOREADY) IVPB 500 mg/100 mL  Status:  Discontinued        500 mg 100 mL/hr over 60 Minutes Intravenous Every 12 hours 10/15/20 0940 10/17/20 1205   10/14/20 0400  vancomycin (VANCOREADY) IVPB 500 mg/100 mL  Status:  Discontinued        500 mg 100 mL/hr over 60 Minutes Intravenous Every 24 hours 10/13/20 0233 10/15/20 0940   10/13/20 0330  vancomycin (VANCOREADY) IVPB 750 mg/150 mL        750 mg 150 mL/hr over 60 Minutes Intravenous  Once 10/13/20 0233 10/13/20 0439   10/12/20 1800  ceFEPIme (MAXIPIME) 2 g in sodium chloride 0.9 % 100 mL IVPB  Status:  Discontinued        2 g 200 mL/hr over 30 Minutes Intravenous Every 24 hours 10/11/20 1718 10/11/20 1742   10/12/20 1800  vancomycin (VANCOREADY) IVPB 750 mg/150 mL  Status:  Discontinued        750 mg 150 mL/hr over 60 Minutes Intravenous Every 24 hours 10/11/20 1724 10/11/20 1758   10/12/20 0200  metroNIDAZOLE (FLAGYL) tablet 500 mg  Status:  Discontinued        500 mg Oral Every 8 hours 10/11/20 1715 10/11/20 1758   10/12/20 0030  piperacillin-tazobactam (ZOSYN) IVPB 3.375 g  Status:  Discontinued        3.375 g 12.5 mL/hr over 240 Minutes Intravenous Every 8 hours 10/11/20 1805 10/17/20 1205   10/11/20 1830  piperacillin-tazobactam (ZOSYN) IVPB 3.375 g        3.375 g 100 mL/hr over 30 Minutes Intravenous  Once 10/11/20 1805 10/11/20 1839   10/11/20 1715  ceFEPIme (MAXIPIME) 2 g in sodium chloride 0.9 % 100 mL IVPB  Status:  Discontinued        2 g 200 mL/hr over 30 Minutes Intravenous STAT 10/11/20 1710 10/11/20 1742   10/11/20 1715  metroNIDAZOLE (FLAGYL) IVPB 500 mg  Status:  Discontinued        500  mg 100 mL/hr over 60 Minutes Intravenous STAT 10/11/20 1710 10/11/20 1807   10/11/20 1715  vancomycin (VANCOCIN) IVPB 1000 mg/200 mL premix  Status:  Discontinued        1,000 mg 200 mL/hr over 60 Minutes Intravenous STAT 10/11/20 1710 10/11/20 1804   06/23/2020 1541  ceFAZolin (ANCEF) 2-4 GM/100ML-% IVPB       Note to Pharmacy: Teofilo PodJohnson, Ericka   : cabinet override      06/23/2020 1541 10/06/20 0344      Assessment/Plan: Cardiac arrest 11/29 - in the setting of retroperitoneal bleeding,  Remains on pressor support this morning.  Retroperitoneal bleeding/hematoma -  4.9 x 19.9 x 5.4 cm, continue to hold heparin,this appears to have stabilized, best I can tell the source of bleeding is related to the drain and rp collection and is not from a gastric or duodenal source. I think the blood in the stomach is coming from the perforation and the connection to the bleeding area in the rp.  Hx  of PUD s/p partial gastrectomy 1990 in [place]  Contained bowel perforation with intra-abdominal abscess - s/p IR drain placement 11/14 - growing klebsiella, continue drain. - suspect that the second portion of the duodenum is most likely site of perforation based on CT.  - s/p emergent EGD at bedside 11/29 by Dr. Bosie Clos due to blood encountered during NG placement and cardiac arrest. Blood was found in gastric remnant and one of the jejunal limbs but no source of bleeding was found.  - continue NG To LIWS - recommendSTRICTNPO and TPN - no acute surgical needs. Hold heparin. -the perforation is contained, there is no real surgery to fix the hemorrhage.  There is a role for surgery at some point but this is not needed right now and would not be well tolerated for what this would require.   L hemothoraxafter thoracentesis- S/p VATS 11/22 Dr. Cliffton Asters, per TCTS, pleural culture also growing klebsiella.  FEN: NPO, NGT to LIWS, IVF, PICC and TPN  VTE: SCDs, anticoagulation held in the setting of acute blood  loss ID: current abx are zosyn and diflucan; RP cultures and pleural cultures both growing klebsiella, raising concern that the two collections are connected.   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

## 2020-10-31 NOTE — Consult Note (Signed)
Reason for Consult: AKI Referring Physician:  Lamonte Sakai, MD  Mark May is an 64 y.o. 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  His hospitalization has been complicated by acute hypoxic respiratory failure due to pulmonary edema requiring BiPAP.  He was awaiting CIR placement when he complained of abdomina pain and became more lethargic with tachycardia and tachypnea.  He also had AKI with Scr rising to 1.6 but lisinopril was stopped and his Scr improved.  He later developed a fever as well as hypotension and new onset A fib with RVR and found to have sepsis due to RLL PNA (presumably aspiration).  He had progressive abdominal distension and CT scan of abd/pelvis showed large right perinephric fluid collection with air concerning for abscess.  He underwent CT guided drain placement and then developed melena with ABLA on 10/15/20.  Surgery was consulted for possible bowel perforation and on 10/20/20 he developed respiratory distress requiring intubation.  He was found to have larger pleural effusion and underwent thoracentesis, however he developed a hemothorax requiring VATS on 11/22.  He has had progressive hypotension and suffered a cardiac arrest in setting of retroperitoneal bleed on 10/11/2020.  He was started on pressors and had multiple blood transfusions.  We were consulted due to the development of oliguric AKI.  The trend in Scr is seen below.   He received IV contrast on 10/21/20 and is currently on zosyn for ongoing infection.   Trend in Creatinine: Creatinine, Ser  Date/Time Value Ref Range Status  10/31/2020 02:26 PM 4.38 (H) 0.61 - 1.24 mg/dL Final  10/31/2020 02:31 AM 3.94 (H) 0.61 - 1.24 mg/dL Final  10/30/2020 07:50 PM 3.57 (H) 0.61 - 1.24 mg/dL Final  10/30/2020 09:48 AM 3.04 (H) 0.61 - 1.24 mg/dL  Final  10/30/2020 03:39 AM 2.71 (H) 0.61 - 1.24 mg/dL Final  10/26/2020 09:00 PM 2.41 (H) 0.61 - 1.24 mg/dL Final  10/27/2020 04:28 PM 1.94 (H) 0.61 - 1.24 mg/dL Final  10/28/2020 05:29 AM 1.46 (H) 0.61 - 1.24 mg/dL Final  10/28/2020 06:29 AM 1.69 (H) 0.61 - 1.24 mg/dL Final  10/27/2020 06:20 AM 1.78 (H) 0.61 - 1.24 mg/dL Final  10/27/2020 04:43 AM 1.83 (H) 0.61 - 1.24 mg/dL Final  10/26/2020 05:00 AM 2.49 (H) 0.61 - 1.24 mg/dL Final  10/25/2020 07:41 AM 2.67 (H) 0.61 - 1.24 mg/dL Final  10/25/2020 02:13 AM 2.56 (H) 0.61 - 1.24 mg/dL Final  10/24/2020 07:50 AM 2.88 (H) 0.61 - 1.24 mg/dL Final  10/23/2020 05:36 AM 2.96 (H) 0.61 - 1.24 mg/dL Final  10/19/2020 04:46 AM 2.74 (H) 0.61 - 1.24 mg/dL Final  10/21/2020 05:37 PM 2.62 (H) 0.61 - 1.24 mg/dL Final  10/20/2020 06:32 AM 1.20 0.61 - 1.24 mg/dL Final  10/19/2020 12:32 AM 1.03 0.61 - 1.24 mg/dL Final  10/18/2020 07:26 AM 1.07 0.61 - 1.24 mg/dL Final  10/17/2020 09:20 AM 1.10 0.61 - 1.24 mg/dL Final  10/16/2020 05:08 AM 1.32 (H) 0.61 - 1.24 mg/dL Final  10/15/2020 04:08 AM 1.40 (H) 0.61 - 1.24 mg/dL Final  10/14/2020 01:16 AM 1.35 (H) 0.61 - 1.24 mg/dL Final  10/13/2020 10:09 AM 1.40 (H) 0.61 - 1.24 mg/dL Final  10/12/2020 03:14 AM 1.61 (H) 0.61 - 1.24 mg/dL Final  10/11/2020 05:06 PM 1.86 (H) 0.61 - 1.24 mg/dL Final  10/11/2020 09:15 AM  1.96 (H) 0.61 - 1.24 mg/dL Final  10/10/2020 02:59 AM 1.69 (H) 0.61 - 1.24 mg/dL Final  10/09/2020 03:29 AM 1.48 (H) 0.61 - 1.24 mg/dL Final  10/08/2020 07:27 AM 1.39 (H) 0.61 - 1.24 mg/dL Final  10/07/2020 05:27 AM 0.81 0.61 - 1.24 mg/dL Final  10/06/2020 05:22 AM 1.02 0.61 - 1.24 mg/dL Final  10/14/2020 12:46 PM 0.89 0.61 - 1.24 mg/dL Final  03/09/2018 04:20 PM 0.74 (L) 0.76 - 1.27 mg/dL Final  02/17/2018 03:58 PM 0.86 0.76 - 1.27 mg/dL Final    PMH:   Past Medical History:  Diagnosis Date  . Gout   . Hypertension     PSH:   Past Surgical History:  Procedure Laterality Date  .  ESOPHAGOGASTRODUODENOSCOPY N/A 10/21/2020   Procedure: ESOPHAGOGASTRODUODENOSCOPY (EGD);  Surgeon: Wilford Corner, MD;  Location: [place];  Service: Endoscopy;  Laterality: N/A;  . IR CT HEAD LTD  10/27/2020  . IR INTRA CRAN STENT  10/10/2020  . IR PERCUTANEOUS ART THROMBECTOMY/INFUSION INTRACRANIAL INC DIAG ANGIO  10/13/2020  . PARTIAL GASTRECTOMY  1990   done in [place] for PUD  . PROSTATE BIOPSY  ~ 2005   negative for cancer (done in [place])  . RADIOLOGY WITH ANESTHESIA N/A 10/12/2020   Procedure: IR WITH ANESTHESIA;  Surgeon: Radiologist, Medication, MD;  Location: [place];  Service: Radiology;  Laterality: N/A;  . VIDEO ASSISTED THORACOSCOPY (VATS)/DECORTICATION Left 10/04/2020   Procedure: VIDEO ASSISTED THORACOSCOPY (VATS)/DECORTICATION;  Surgeon: Lajuana Matte, MD;  Location: [place];  Service: Thoracic;  Laterality: Left;  Pt is already intubate.  No need to change to a double lumen.  Will do intermittent apnea for the case    Allergies: No Known Allergies  Medications:   Prior to Admission medications   Medication Sig Start Date End Date Taking? Authorizing Provider  allopurinol (ZYLOPRIM) 100 MG tablet Take 3 tablets (300 mg total) by mouth daily. Patient not taking: Reported on 10/09/2020 02/17/18   Glean Hess, MD  amLODipine (NORVASC) 10 MG tablet Take 1 tablet (10 mg total) by mouth daily. Patient not taking: Reported on 10/09/2020 02/17/18   Glean Hess, MD  tamsulosin [facility]) 0.4 MG CAPS capsule Take 1 capsule (0.4 mg total) by mouth daily after supper. Patient not taking: Reported on 10/09/2020 03/09/18   Glean Hess, MD    Inpatient medications: .  stroke: mapping our early stages of recovery book   Does not apply Once  . Chlorhexidine Gluconate Cloth  6 each Topical Daily  . insulin aspart  3-9 Units Subcutaneous Q4H  . mouth rinse  15 mL Mouth Rinse BID  . [START ON 11/02/2020] pantoprazole  40 mg Intravenous Q12H  . sodium chloride flush  10-40 mL  Intracatheter Q12H  . sodium chloride flush  10-40 mL Intracatheter Q12H  . sodium chloride flush  5 mL Intracatheter Q8H    Discontinued Meds:   Medications Discontinued During This Encounter  Medication Reason  . tirofiban (AGGRASTAT) 5-0.9 MG/100ML-% injection Returned to ADS  . verapamil (ISOPTIN) 2.5 MG/ML injection Returned to ADS  . eptifibatide (INTEGRILIN) 20 MG/10ML injection Returned to ADS  . clopidogrel (PLAVIX) 300 MG tablet Returned to ADS  . acetaminophen (TYLENOL) tablet 732 mg Duplicate  . acetaminophen (TYLENOL) 160 MG/5ML solution 202 mg Duplicate  . acetaminophen (TYLENOL) suppository 542 mg Duplicate  . propofol (DIPRIVAN) 1000 MG/100ML infusion   . 0.9 %  sodium chloride infusion   . 0.9 %  sodium chloride infusion   .  polyethylene glycol (MIRALAX / GLYCOLAX) packet 17 g   . docusate (COLACE) 50 MG/5ML liquid 100 mg   . senna-docusate (Senokot-S) tablet 1 tablet   . fentaNYL (SUBLIMAZE) injection 50 mcg   . fentaNYL (SUBLIMAZE) injection 50-200 mcg   . lactated ringers infusion   . propofol (DIPRIVAN) 1000 MG/100ML infusion   . polyethylene glycol (MIRALAX / GLYCOLAX) packet 17 g   . docusate (COLACE) 50 MG/5ML liquid 100 mg   . potassium chloride 20 MEQ/15ML (10%) solution 20 mEq   . potassium chloride 20 MEQ/15ML (10%) solution 40 mEq   . senna-docusate (Senokot-S) tablet 1 tablet   . lisinopril (ZESTRIL) tablet 10 mg   . chlorhexidine gluconate (MEDLINE KIT) (PERIDEX) 0.12 % solution 15 mL   . MEDLINE mouth rinse   . pantoprazole (PROTONIX) injection 40 mg   . lisinopril (ZESTRIL) tablet 10 mg   . pantoprazole sodium (PROTONIX) 40 mg/20 mL oral suspension 40 mg   . lisinopril (ZESTRIL) tablet 10 mg   . feeding supplement (VITAL HIGH PROTEIN) liquid 1,000 mL   . feeding supplement (PROSource TF) liquid 45 mL   . feeding supplement (OSMOLITE 1.2 CAL) liquid 1,000 mL   . potassium chloride 20 MEQ/15ML (10%) solution 40 mEq   . pantoprazole (PROTONIX) EC  tablet 40 mg   . meclizine (ANTIVERT) 25 MG tablet No longer needed (for PRN medications)  . meloxicam (MOBIC) 15 MG tablet Patient Preference  . tizanidine (ZANAFLEX) 2 MG capsule No longer needed (for PRN medications)  . lisinopril (ZESTRIL) tablet 10 mg   . loperamide (IMODIUM) capsule 2 mg   . melatonin tablet 3 mg   . aspirin chewable tablet 81 mg   . aspirin chewable tablet 81 mg   . enoxaparin (LOVENOX) injection 40 mg   . metoprolol tartrate (LOPRESSOR) tablet 25 mg   . 0.9 %  sodium chloride infusion   . lactated ringers bolus 1,000 mL   . tamsulosin (FLOMAX) capsule 0.4 mg   . diltiazem (CARDIZEM) 1 mg/mL load via infusion 15 mg   . diltiazem (CARDIZEM) 125 mg in dextrose 5% 125 mL (1 mg/mL) infusion   . ceFEPIme (MAXIPIME) 2 g in sodium chloride 0.9 % 100 mL IVPB   . ceFEPIme (MAXIPIME) 2 g in sodium chloride 0.9 % 100 mL IVPB   . metroNIDAZOLE (FLAGYL) tablet 500 mg   . vancomycin (VANCOREADY) IVPB 750 mg/150 mL   . vancomycin (VANCOCIN) IVPB 1000 mg/200 mL premix   . metroNIDAZOLE (FLAGYL) IVPB 500 mg   . chlorproMAZINE (THORAZINE) 12.5 mg in sodium chloride 0.9 % 25 mL IVPB   . HYDROmorphone (DILAUDID) injection 0.5 mg   . feeding supplement (OSMOLITE 1.2 CAL) liquid 1,000 mL   . feeding supplement (VITAL HIGH PROTEIN) liquid 1,000 mL   . senna-docusate (Senokot-S) tablet 1 tablet   . Resource ThickenUp Clear   . atorvastatin (LIPITOR) tablet 20 mg   . amiodarone (NEXTERONE PREMIX) 360-4.14 MG/200ML-% (1.8 mg/mL) IV infusion   . lactated ringers infusion   . furosemide (LASIX) injection 40 mg   . amiodarone (PACERONE) tablet 200 mg   . amiodarone (PACERONE) tablet 200 mg   . feeding supplement (OSMOLITE 1.5 CAL) liquid 1,000 mL   . free water 150 mL   . feeding supplement (PROSource TF) liquid 45 mL   . atorvastatin (LIPITOR) tablet 20 mg   . heparin ADULT infusion 100 units/mL (25000 units/262m sodium chloride 0.45%)   . sodium phosphate (FLEET) 7-19 GM/118ML  enema  1 enema   . 0.9 %  sodium chloride infusion   . vancomycin (VANCOREADY) IVPB 500 mg/100 mL   . famotidine (PEPCID) IVPB 20 mg premix   . 0.9 %  sodium chloride infusion   . potassium chloride 10 mEq in 100 mL IVPB   . heparin ADULT infusion 100 units/mL (25000 units/235m sodium chloride 0.45%)   . QUEtiapine (SEROQUEL) tablet 25 mg   . ticagrelor (BRILINTA) tablet 90 mg   . ticagrelor (BRILINTA) tablet 90 mg   . Resource ThickenUp Clear   . feeding supplement (OSMOLITE 1.5 CAL) liquid 1,000 mL   . piperacillin-tazobactam (ZOSYN) IVPB 3.375 g   . vancomycin (VANCOREADY) IVPB 500 mg/100 mL   . cangrelor (KENGREAL) 50 mg in sodium chloride 0.9 % 250 mL (0.2 mg/mL) infusion   . morphine 2 MG/ML injection 1 mg   . senna-docusate (Senokot-S) tablet 1 tablet   . acetaminophen (TYLENOL) tablet 650 mg   . acetaminophen (TYLENOL) 160 MG/5ML solution 650 mg   . acetaminophen (TYLENOL) suppository 650 mg   . bisacodyl (DULCOLAX) suppository 10 mg   . amiodarone (PACERONE) tablet 200 mg   . amiodarone (PACERONE) tablet 200 mg   . QUEtiapine (SEROQUEL) tablet 25 mg   . feeding supplement (PROSource TF) liquid 45 mL   . free water 200 mL Discontinued by provider  . cangrelor ([facility] 50 mg in sodium chloride 0.9 % 250 mL (0.2 mg/mL) infusion   . diltiazem (CARDIZEM) injection 15 mg   . chlorhexidine (PERIDEX) 0.12 % solution 15 mL   . MEDLINE mouth rinse   . dextrose 5 % and 0.2 % NaCl infusion   . Ampicillin-Sulbactam (UNASYN) 3 g in sodium chloride 0.9 % 100 mL IVPB   . morphine 2 MG/ML injection 2-4 mg   . hydrALAZINE (APRESOLINE) injection 10 mg   . LORazepam (ATIVAN) injection 0.5 mg   . fentaNYL (SUBLIMAZE) 100 MCG/2ML injection Returned to ADS  . etomidate (AMIDATE) 2 MG/ML injection Returned to ADS  . rocuronium bromide 100 MG/10ML SOSY Returned to ADS  . midazolam (VERSED) 2 MG/2ML injection Returned to ADS  . vancomycin (VANCOREADY) IVPB 500 mg/100 mL   . insulin aspart  (novoLOG) injection 0-9 Units   . ceFEPIme (MAXIPIME) 2 g in sodium chloride 0.9 % 100 mL IVPB   . metroNIDAZOLE (FLAGYL) IVPB 500 mg   . midazolam (VERSED) 2 MG/2ML injection Returned to ADS  . etomidate (AMIDATE) 2 MG/ML injection Returned to ADS  . fentaNYL (SUBLIMAZE) 100 MCG/2ML injection Returned to ADS  . metoprolol tartrate (LOPRESSOR) injection 5 mg   . nicardipine (CARDENE) 225min 0.86% saline 20079mV infusion (0.1 mg/ml)   . chlorhexidine (PERIDEX) 0.12 % solution 15 mL   . MEDLINE mouth rinse   . propofol (DIPRIVAN) 1000 MG/100ML infusion   . labetalol (NORMODYNE) injection 10-20 mg   . TPN ADULT (ION)   . TPN ADULT (ION)   . TPN ADULT (ION)   . dextrose 5 % solution   . fluconazole (DIFLUCAN) IVPB 400 mg   . bupivacaine liposome (EXPAREL 1.3%) 20 ml and bupivacaine (MARCAINE 0.5%) 30 ml with option for NS 43m32mtient Discharge  . 0.9 % irrigation (POUR BTL) Patient Discharge  . phenylephrine (NEOSYNEPHRINE) 10-0.9 MG/250ML-% infusion   . insulin aspart (novoLOG) injection 0-15 Units   . metolazone (ZAROXOLYN) tablet 2.5 mg   . furosemide (LASIX) injection 40 mg   . norepinephrine (LEVOPHED) 16 mg in 243mL56mmix infusion   .  insulin aspart (novoLOG) injection 0-20 Units   . phenylephrine CONCENTRATED 146m in sodium chloride 0.9% 2581m(0.72m65mL) infusion   . insulin detemir (LEVEMIR) injection 10 Units Dose change  . insulin aspart (novoLOG) injection 3 Units Dose change  . dextrose 10 % infusion Dose change  . potassium chloride (KLOR-CON) packet 40 mEq   . fentaNYL (SUBLIMAZE) injection 25-100 mcg   . fentaNYL 2500m56mn NS 250mL83mmcg72m infusion-PREMIX   . dexmedetomidine (PRECEDEX) 400 MCG/100ML (4 mcg/mL) infusion   . norepinephrine (LEVOPHED) 72mg in73m0mL pr62m infusion   . vasopressin (PITRESSIN) 20 Units in sodium chloride 0.9 % 100 mL infusion-*FOR SHOCK*   . chlorhexidine gluconate (MEDLINE KIT) (PERIDEX) 0.12 % solution 15 mL   . MEDLINE mouth rinse    . heparin ADULT infusion 100 units/mL (25000 units/250mL sod272mchloride 0.45%)   . heparin ADULT infusion 100 units/mL (25000 units/250mL sodi13mhloride 0.45%)   . fentaNYL (SUBLIMAZE) 100 MCG/2ML injection Returned to ADS  . TPN ADULT (ION)   . aspirin suppository 300 mg   . pantoprazole (PROTONIX) injection 40 mg   . norepinephrine (LEVOPHED) 72mg in 2505mpremix572musion   . midazolam PF (VERSED) injection Patient Discharge  . fentaNYL (SUBLIMAZE) injection Patient Discharge  . docusate (COLACE) 50 MG/5ML liquid 100 mg   . polyethylene glycol (MIRALAX / GLYCOLAX) packet 17 g   . piperacillin-tazobactam (ZOSYN) IVPB 3.375 g   . dextrose 50 % solution 0-50 mL   . midazolam (VERSED) injection 1 mg   . 0.9 %  sodium chloride infusion (Manually program via Guardrails IV Fluids)   . vasopressin (PITRESSIN) 20 Units in sodium chloride 0.9 % 100 mL infusion-*FOR SHOCK*     Social History:  reports that he has never smoked. He has never used smokeless tobacco. He reports that he does not drink alcohol and does not use drugs.  Family History:   Family History  Problem Relation Age of Onset  . Diabetes Mother   . Diabetes Father     Review of systems not obtained due to patient factors. Weight change: 3.6 kg  Intake/Output Summary (Last 24 hours) at 10/31/2020 1639 Last data filed at 10/31/2020 0900 Gross per 24 hour  Intake 4064.35 ml  Output 53 ml  Net 4011.35 ml   BP 120/69   Pulse 63   Temp 98.9 F (37.2 C) (Axillary)   Resp (!) 36   Wt 62.4 kg   SpO2 100%   BMI 21.55 kg/m  Vitals:   10/31/20 0600 10/31/20 0800 10/31/20 0818 10/31/20 1149  BP: 120/69     Pulse: (!) 107  83 63  Resp: (!) 30  (!) 30 (!) 36  Temp:  98.9 F (37.2 C)    TempSrc:  Axillary    SpO2: 97%  98% 100%  Weight:         General appearance: cachectic and criticall ill-appearing Asian male intubated and sedated Head: atraumatic, bitemporal wasting Resp: rhonchi bilaterally Cardio:  tachycardic at 107, no rub GI: distended, hypoactive bowel sounds, firm, right sided drain in place. Extremities: edema 1+ edema of lower ext to sacrum  Labs: Basic Metabolic Panel: Recent Labs  Lab 10/25/20 0213 10/25/20 0213 10/25/20 0741 10/25/20 0741 10/26/20 0500 10/26/20 0500 10/27/20 0443 10/27/20 0620 10/14/2020 0529 10/18/2020 0529 10/10/2020 1628 10/12/2020 1737 10/25/2020 2100 10/30/20 0339 10/30/20 0948 10/30/20 1950 10/31/20 0231 10/31/20 1211 10/31/20 1426  NA 139   < > 142   < > 144   < >  143   < > 147*   < > 147*   < > 145 141 143 144 145 140 145  K 4.1   < > 3.5   < > 2.8*   < > 3.8   < > 4.0   < > 7.1*   < > 5.1 4.7 4.7 5.0 4.4 4.3 4.3  CL 105   < > 109   < > 106   < > 104   < > 111   < > 114*  --  101 99 96* 94* 95*  --  95*  CO2 21*   < > 21*   < > 24   < > 26   < > 24   < > 12*  --  15* 20* 21* 24 23  --  19*  GLUCOSE 563*   < > 130*   < > 165*   < > 433*   < > 175*   < > 236*  --  265* 93 91 78 74  --  75  BUN 72*   < > 77*   < > 80*   < > 66*   < > 56*   < > 60*  --  63* 68* 75* 87* 91*  --  95*  CREATININE 2.56*   < > 2.67*   < > 2.49*   < > 1.83*   < > 1.46*   < > 1.94*  --  2.41* 2.71* 3.04* 3.57* 3.94*  --  4.38*  ALBUMIN 1.1*  --  1.1*  --  1.1*  --   --   --  1.4*  --   --   --   --   --   --   --  1.6*  --   --   CALCIUM 7.6*   < > 7.5*   < > 7.8*   < > 7.7*   < > 7.8*   < > 8.2*  --  6.4* 6.5* 6.5* 6.2* 6.4*  --  6.3*  PHOS 2.2*  --  2.4*  --  2.9  --  3.0  --  2.9  --   --   --   --   --   --   --  5.9*  --   --    < > = values in this interval not displayed.   Liver Function Tests: Recent Labs  Lab 10/26/20 0500 10/25/2020 0529 10/31/20 0231  AST 67* 89* 2,275*  ALT 128* 124* 1,447*  ALKPHOS 90 206* 185*  BILITOT 2.1* 1.3* 5.8*  PROT 5.3* 5.9* 4.9*  ALBUMIN 1.1* 1.4* 1.6*   No results for input(s): LIPASE, AMYLASE in the last 168 hours. No results for input(s): AMMONIA in the last 168 hours. CBC: Recent Labs  Lab 10/27/20 0620  10/28/20 0444 10/28/20 1537 10/28/20 1537 10/15/2020 0529 10/06/2020 1735 10/30/20 0948 10/30/20 0948 10/30/20 1912 10/31/20 0214 10/31/20 1211 10/31/20 1426  WBC 11.2*   < > 9.6   < > 12.5*   < > 28.5*  --  23.6* 19.8*  --  14.6*  NEUTROABS 9.4*  --  7.6  --  10.0*  --   --   --   --   --   --   --   HGB 8.7*   < > 8.0*   < > 8.2*   < > 6.3*   < > 9.2* 9.2* 12.2* 8.8*  HCT 26.7*   < > 24.7*   < >  26.0*   < > 18.6*   < > 26.8* 26.3* 36.0* 26.5*  MCV 85.6   < > 86.7   < > 87.5   < > 86.5  --  83.2 82.4  --  86.9  PLT 229   < > 268   < > 303   < > 140*  --  148* 147*  --  141*   < > = values in this interval not displayed.   PT/INR: '@LABRCNTIP' (inr:5) Cardiac Enzymes: )No results for input(s): CKTOTAL, CKMB, CKMBINDEX, TROPONINI in the last 168 hours. CBG: Recent Labs  Lab 10/31/20 0339 10/31/20 0746 10/31/20 1218 10/31/20 1305 10/31/20 1617  GLUCAP 58* 98 57* 64* 75    Iron Studies: No results for input(s): IRON, TIBC, TRANSFERRIN, FERRITIN in the last 168 hours.  Xrays/Other Studies: CT ABDOMEN PELVIS WO CONTRAST  Addendum Date: 10/30/2020   ADDENDUM REPORT: 10/30/2020 02:04 ADDENDUM: Critical Value/emergent results were called by telephone at the time of interpretation on 10/30/2020 at 2:03 am to Dr. Genevive Bi, Who verbally acknowledged these results. Electronically Signed   By: Ulyses Jarred M.D.   On: 10/30/2020 02:04   Result Date: 10/30/2020 CLINICAL DATA:  Chest and abdominal pain.  Cardiopulmonary arrest. EXAM: CT CHEST, ABDOMEN AND PELVIS WITHOUT CONTRAST TECHNIQUE: Multidetector CT imaging of the chest, abdomen and pelvis was performed following the standard protocol without IV contrast. COMPARISON:  None. FINDINGS: CT CHEST FINDINGS Cardiovascular: Calcific aortic atherosclerosis. Normal heart size. No pericardial effusion. Mediastinum/Nodes: No adenopathy. Esophageal catheter terminates in the stomach. Normal thyroid. Lungs/Pleura: Left-greater-than-right pleural  effusions. There is a left-sided chest tube terminates near the left apex. Hazy opacities in the right upper lobe. Bibasilar atelectasis. Endotracheal tube tip is at the level of the clavicular heads. Musculoskeletal: No chest wall mass or suspicious bone lesions identified. CT ABDOMEN PELVIS FINDINGS Hepatobiliary: Unremarkable liver. Cholelithiasis without other gallbladder abnormality. Pancreas: Unremarkable. No pancreatic ductal dilatation or surrounding inflammatory changes. Spleen: Normal in size without focal abnormality. Adrenals/Urinary Tract: Normal adrenal glands. Multiple 3-4 mm calcifications at the right renal hilum. Stomach/Bowel: There is enteric contrast within distal small bowel and throughout the colon. There is no extravasated contrast visible. There is a posterior approach percutaneous right lower quadrant brain within an abscess cavity that is decreased in size. The abscess cavity is directly adjacent to the distal second portion of the duodenum. Vascular/Lymphatic: Aortic atherosclerosis. No enlarged abdominal or pelvic lymph nodes. Reproductive: Prostate is unremarkable. Other: There is a right retroperitoneal hematoma along the course of the drainage catheter. Dimensions are approximately 4.9 x 19.9 x 5.4 cm. There is intramuscular hematoma of the right psoas and iliacus muscles. Musculoskeletal: No acute or significant osseous findings. IMPRESSION: 1. Right lower quadrant percutaneous drainage catheter with decreased size of the abscess cavity. The abscess cavity is directly adjacent to the distal second portion of the duodenum. No extravasated contrast. 2. Right retroperitoneal hematoma along the course of the drainage catheter, measuring approximately 4.9 x 19.9 x 5.4 cm in involving the right psoas and iliacus muscles. 3. Left-greater-than-right pleural effusions with left-sided chest tube terminating near the left apex. Left-sided extrapleural collection has decreased in size. Aortic  Atherosclerosis (ICD10-I70.0). Electronically Signed: By: Ulyses Jarred M.D. On: 10/30/2020 01:53   CT CHEST WO CONTRAST  Addendum Date: 10/30/2020   ADDENDUM REPORT: 10/30/2020 02:04 ADDENDUM: Critical Value/emergent results were called by telephone at the time of interpretation on 10/30/2020 at 2:03 am to Dr. Genevive Bi, Who verbally acknowledged these results. Electronically Signed  By: Ulyses Jarred M.D.   On: 10/30/2020 02:04   Result Date: 10/30/2020 CLINICAL DATA:  Chest and abdominal pain.  Cardiopulmonary arrest. EXAM: CT CHEST, ABDOMEN AND PELVIS WITHOUT CONTRAST TECHNIQUE: Multidetector CT imaging of the chest, abdomen and pelvis was performed following the standard protocol without IV contrast. COMPARISON:  None. FINDINGS: CT CHEST FINDINGS Cardiovascular: Calcific aortic atherosclerosis. Normal heart size. No pericardial effusion. Mediastinum/Nodes: No adenopathy. Esophageal catheter terminates in the stomach. Normal thyroid. Lungs/Pleura: Left-greater-than-right pleural effusions. There is a left-sided chest tube terminates near the left apex. Hazy opacities in the right upper lobe. Bibasilar atelectasis. Endotracheal tube tip is at the level of the clavicular heads. Musculoskeletal: No chest wall mass or suspicious bone lesions identified. CT ABDOMEN PELVIS FINDINGS Hepatobiliary: Unremarkable liver. Cholelithiasis without other gallbladder abnormality. Pancreas: Unremarkable. No pancreatic ductal dilatation or surrounding inflammatory changes. Spleen: Normal in size without focal abnormality. Adrenals/Urinary Tract: Normal adrenal glands. Multiple 3-4 mm calcifications at the right renal hilum. Stomach/Bowel: There is enteric contrast within distal small bowel and throughout the colon. There is no extravasated contrast visible. There is a posterior approach percutaneous right lower quadrant brain within an abscess cavity that is decreased in size. The abscess cavity is directly adjacent to the  distal second portion of the duodenum. Vascular/Lymphatic: Aortic atherosclerosis. No enlarged abdominal or pelvic lymph nodes. Reproductive: Prostate is unremarkable. Other: There is a right retroperitoneal hematoma along the course of the drainage catheter. Dimensions are approximately 4.9 x 19.9 x 5.4 cm. There is intramuscular hematoma of the right psoas and iliacus muscles. Musculoskeletal: No acute or significant osseous findings. IMPRESSION: 1. Right lower quadrant percutaneous drainage catheter with decreased size of the abscess cavity. The abscess cavity is directly adjacent to the distal second portion of the duodenum. No extravasated contrast. 2. Right retroperitoneal hematoma along the course of the drainage catheter, measuring approximately 4.9 x 19.9 x 5.4 cm in involving the right psoas and iliacus muscles. 3. Left-greater-than-right pleural effusions with left-sided chest tube terminating near the left apex. Left-sided extrapleural collection has decreased in size. Aortic Atherosclerosis (ICD10-I70.0). Electronically Signed: By: Ulyses Jarred M.D. On: 10/30/2020 01:53   DG CHEST PORT 1 VIEW  Result Date: 10/31/2020 CLINICAL DATA:  Pleural effusion EXAM: PORTABLE CHEST 1 VIEW COMPARISON:  10/30/2020 FINDINGS: Endotracheal tube is seen 4.7 cm above the carina. Nasogastric tube extends into the upper abdomen beyond the margin of the examination. Right upper extremity PICC line with its tip within the right atrium and left large bore chest tube at the left apex are all unchanged. Lung volumes are small. There is progressive retrocardiac opacification likely representing atelectasis or posteriorly layering pleural fluid in this location. There is stable lateral pleural thickening within the left hemithorax. There no pneumothorax. Minimal right basilar atelectasis. No pleural effusion on the right. Cardiac size within normal limits. Pulmonary vascularity is normal. IMPRESSION: Stable support lines and  tubes. Stable left lateral pleural thickening. Progressive retrocardiac opacification likely related to retrocardiac atelectasis, posterior layering small pleural fluid, or a combination there of. Electronically Signed   By: Fidela Salisbury MD   On: 10/31/2020 06:23   DG CHEST PORT 1 VIEW  Result Date: 10/30/2020 CLINICAL DATA:  Pneumothorax EXAM: PORTABLE CHEST 1 VIEW COMPARISON:  10/11/2020 FINDINGS: Endotracheal tube, nasogastric tube looped within the gastric fundus, right upper extremity PICC line with its tip within the superior right atrium, mediastinal drain, and large bore left chest tube extending to the apex are all unchanged. Lung volumes are  small, however, pulmonary insufflation remain stable since prior examination. Small right pleural effusion is present. Left lateral pleural thickening is unchanged. Bibasilar atelectasis is seen. No pneumothorax. Cardiac size within normal limits. No acute bone abnormality. IMPRESSION: Left chest tube in place. No pneumothorax. Stable left pleural thickening. Small right pleural effusion. Stable support lines and tubes. Electronically Signed   By: Fidela Salisbury MD   On: 10/30/2020 05:17   DG CHEST PORT 1 VIEW  Result Date: 10/18/2020 CLINICAL DATA:  Cardiac arrest EXAM: PORTABLE CHEST 1 VIEW COMPARISON:  Same-day chest radiograph at 0529 hours. FINDINGS: Endotracheal tube with tip overlying the midthoracic trachea. Defibrillator leads overlie left chest/abdomen. Right upper extremity PICC with tip overlying the SVC. Left inferior approach thoracostomy tube with tip in the left lung apex, unchanged. The cardiomediastinal silhouette is partially obscured but appears unchanged. Similar size of the loculated left pleural effusion. Stable small right pleural effusion. Low lung volumes with hypoventilatory change in the dependent lungs. IMPRESSION: Endotracheal tube with tip overlying the midthoracic trachea. Stable size of the loculated left pleural effusion  and position of the left chest tube. Stable small right pleural effusion and bibasilar atelectasis. Electronically Signed   By: Dahlia Bailiff MD   On: 10/25/2020 16:50   [place] EKG SITE RITE  Result Date: 10/31/2020 If Site Rite image not attached, placement could not be confirmed due to current cardiac rhythm.    Assessment/Plan: 1.  AKI, oliguric- presumably due to ischemic ATN in setting of shock and ABLA.  Renal dose meds.  No emergent indication for dialysis, however if he remains anuric and worsening electrolytes will likely initiate RRT in the next 24 hours.   1. Check bladder scans 2. No evidence of obstruction on CT of abd/pelvis on 10/21/20 but may need to repeat. 2. Metabolic acidosis- due to #1.  Currently on bicarb drip but CO2 dropping. 3. Acute respiratory failure due to aspiration pneumonia- on vent per PCCM 4. Shock- multifactorial with infection and ABLA as well as A fib and RVR.  Currently on levophed drip.  Wean per PCCM 5. Klebsiella infection- from pleural culture and retroperitoneal abscess.  Currently on Zosyn.  Need to adjust dose due to AKI and GFR <20 6. ABLA from GI bleed- cont to transfuse as needed 7. Duodenal perforation- on abx.  GI and surgery following. 8. PAF with RVR- currently rate controlled with amiodarone.  No heparin due to GIB.   9. Left hemothorax s/p VATS. 10. Severe protein malnutrition- per PCCM 11. 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 12. Disposition- poor overall prognosis.  Continue with aggressive measures for now but may benefit from Palliative care consult to help set goals/limits of care.    Mark May 10/31/2020, 4:39 PM

## 2020-10-31 NOTE — Progress Notes (Addendum)
[facility] DAILY ICU PROGRESS NOTE                   [address]            [place] [address]          [phone]   2 Days Post-Op Procedure(s) (LRB): ESOPHAGOGASTRODUODENOSCOPY (EGD) (N/A)  Total Length of Stay:  LOS: 26 days   Subjective: Patient remains intubated, sedated  Objective: Vital signs in last 24 hours: Temp:  [97.9 F (36.6 C)-101.3 F (38.5 C)] 100.1 F (37.8 C) (12/01 0346) Pulse Rate:  [49-137] 107 (12/01 0600) Cardiac Rhythm: Normal sinus rhythm (12/01 0400) Resp:  [24-40] 30 (12/01 0600) BP: (77-147)/(45-95) 120/69 (12/01 0600) SpO2:  [93 %-100 %] 97 % (12/01 0600) FiO2 (%):  [40 %-50 %] 40 % (12/01 0349) Weight:  [62.4 kg] 62.4 kg (12/01 0440)  Filed Weights   10/26/20 0500 10/30/20 0500 10/31/20 0440  Weight: 56.1 kg 58.8 kg 62.4 kg   CVP:  [4 mmHg-10 mmHg] 5 mmHg  Intake/Output from previous day: 11/30 0701 - 12/01 0700 In: 7415.8 [I.V.:5954.1; Blood:321; IV Piggyback:1140.7] Out: 85 [Urine:75; Chest Tube:10]  Intake/Output this shift: No intake/output data recorded.  Current Meds: Scheduled Meds: .  stroke: mapping our early stages of recovery book   Does not apply Once  . sodium chloride   Intravenous Once  . Chlorhexidine Gluconate Cloth  6 each Topical Daily  . docusate  100 mg Per Tube BID  . insulin aspart  3-9 Units Subcutaneous Q4H  . mouth rinse  15 mL Mouth Rinse BID  . [START ON 11/02/2020] pantoprazole  40 mg Intravenous Q12H  . polyethylene glycol  17 g Per Tube Daily  . sodium chloride flush  10-40 mL Intracatheter Q12H  . sodium chloride flush  5 mL Intracatheter Q8H   Continuous Infusions: . sodium chloride Stopped (10/07/2020 1300)  . amiodarone 60 mg/hr (10/31/20 0649)   Followed by  . amiodarone    . calcium gluconate 1,000 mg (10/31/20 0622)  . fluconazole (DIFLUCAN) IV 200 mg (10/30/20 1441)  . norepinephrine (LEVOPHED) Adult infusion 10 mcg/min (10/31/20 0600)  . pantoprozole (PROTONIX) infusion 8  mg/hr (10/31/20 0600)  . piperacillin-tazobactam (ZOSYN)  IV 3.375 g (10/31/20 0615)  . sodium bicarbonate (isotonic) 150 mEq in D5W 1000 mL infusion 50 mL/hr at 10/31/20 0600  . TPN ADULT (ION)    . vasopressin Stopped (10/30/20 0941)   PRN Meds:.sodium chloride, acetaminophen, dextrose, fentaNYL (SUBLIMAZE) injection, fentaNYL (SUBLIMAZE) injection, midazolam, midazolam, sodium chloride flush   Heart: RRR Lungs: On vent, clear Abdomen: Soft, some distention, sporadic bowel sounds Extremities: SCDs in place Wound: Left chest tube dressing is clean and dry Chest tube: To suction, no air leak  Lab Results: CBC: Recent Labs    10/30/20 1912 10/31/20 0214  WBC 23.6* 19.8*  HGB 9.2* 9.2*  HCT 26.8* 26.3*  PLT 148* 147*   BMET:  Recent Labs    10/30/20 1950 10/31/20 0231  NA 144 145  K 5.0 4.4  CL 94* 95*  CO2 24 23  GLUCOSE 78 74  BUN 87* 91*  CREATININE 3.57* 3.94*  CALCIUM 6.2* 6.4*    CMET: Lab Results  Component Value Date   WBC 19.8 (H) 10/31/2020   HGB 9.2 (L) 10/31/2020   HCT 26.3 (L) 10/31/2020   PLT 147 (L) 10/31/2020   GLUCOSE 74 10/31/2020   CHOL 173 10/06/2020   TRIG 109 10/26/2020   HDL 33 (  L) 10/06/2020   LDLCALC 85 10/06/2020   ALT 1,447 (H) 10/31/2020   AST PENDING 10/31/2020   NA 145 10/31/2020   K 4.4 10/31/2020   CL 95 (L) 10/31/2020   CREATININE 3.94 (H) 10/31/2020   BUN 91 (H) 10/31/2020   CO2 23 10/31/2020   INR 2.7 (H) 10/23/2020   HGBA1C 5.8 (H) 10/06/2020     PT/INR:  Recent Labs    10/16/2020 2100  LABPROT 28.1*  INR 2.7*   Radiology: DG CHEST PORT 1 VIEW  Result Date: 10/31/2020 CLINICAL DATA:  Pleural effusion EXAM: PORTABLE CHEST 1 VIEW COMPARISON:  10/30/2020 FINDINGS: Endotracheal tube is seen 4.7 cm above the carina. Nasogastric tube extends into the upper abdomen beyond the margin of the examination. Right upper extremity PICC line with its tip within the right atrium and left large bore chest tube at the left apex  are all unchanged. Lung volumes are small. There is progressive retrocardiac opacification likely representing atelectasis or posteriorly layering pleural fluid in this location. There is stable lateral pleural thickening within the left hemithorax. There no pneumothorax. Minimal right basilar atelectasis. No pleural effusion on the right. Cardiac size within normal limits. Pulmonary vascularity is normal. IMPRESSION: Stable support lines and tubes. Stable left lateral pleural thickening. Progressive retrocardiac opacification likely related to retrocardiac atelectasis, posterior layering small pleural fluid, or a combination there of. Electronically Signed   By: Helyn Numbers MD   On: 10/31/2020 06:23    Assessment/Plan: S/P Procedure(s) (LRB): ESOPHAGOGASTRODUODENOSCOPY (EGD) (N/A) 1. CV- History of PAF with RVR. SR this am. On Neo Synephrine, Amiodarone drips.  2. Pulmonary-Acute respiratory failure, likely secondary to aspiration PNA. Extubated 11/26. He was re intubated later afternoon yesterday after cardiac arrest. Chest tube with 10 cc last 24 hours. CXR this am appears stable (left pleural thickening, no pneumothorax, atelectasis.) 3. GI-s/p emergent bedside EGD for bleeding. On Protonix drip. CT guided drainage of right retroperitoneal abscess 11/14. CT done yesterday (right lower quadrant percutaneous drainage catheter with decreased size of the abscess cavity, right retroperitoneal hematoma along the course of the drainage catheter, measuring approximately 4.9 x 19.9 x 5.4 cm,  left-greater-than-right pleural effusions, and left-sided extrapleural collection has decreased in size.. NPO, TPN. 4. ID-on Zosyn for RRP abscess, Klebsiella left pleural fluid 5. AKI-Creatinine this am increased to 3.94 6. CVA-s/p thrombectomy and R MCA stent;left hemiplegia 7. Anemia-H and H this am 9.2 and 26.3  Donielle Margaretann Loveless PA-C 10/31/2020 7:01 AM    Minimal CT output Will remove  tomorrow.  Rayann Jolley Keane Scrape

## 2020-10-31 NOTE — Progress Notes (Signed)
Called by RN who indicates patient is having periods of bradycardia.  Hold amiodarone gtt for now and continue tele monitoring.      Canary Brim, MSN, NP-C, AGACNP-BC [facility] 10/31/2020, 3:54 PM   Please see [email or web address] for pager details.

## 2020-10-31 NOTE — Progress Notes (Signed)
eLink Physician-Brief Progress Note Patient Name: Mark May DOB: 05/26/1956 MRN: [id number]   Date of Service  10/31/2020  HPI/Events of Note  Multiple issues: 1. AFIB with RVR - Ventricular rate = 115 and 2. Hypocalcemia - Ca++ = 6.4 which corrects to 8.32 (Low) given albumin = 1.6.K+ = 4.4 and Mg++ = 2.6. Patient is on a Norepinephrine IV infusion.   eICU Interventions  Plan: 1. Amiodarone IV load and infusion. 2. Replace Ca++.     Intervention Category Major Interventions: Arrhythmia - evaluation and management;Electrolyte abnormality - evaluation and management  Ashur Glatfelter [place] 10/31/2020, 5:53 AM

## 2020-10-31 NOTE — Progress Notes (Signed)
PHARMACY - TOTAL PARENTERAL NUTRITION CONSULT NOTE  Indication: Perforated duodenum  Patient Measurements: Weight: 62.4 kg (137 lb 9.1 oz)   Body mass index is 21.55 kg/m. Usual Weight: 120-125 lbs  Assessment:  64 YOM presented on 11-02-20 with right MCA occlusion and underwent revascularization. PMH significant for PUD post partial gastrectomy in 1990. Patient was started on a dysphagia diet on 10/08/20 and also transferred out of the ICU. Post-op course complicated by Afib RVR, aspiration PNA/cellulitis and AKI. He was then started on TF on 10/12/20, which ended on 10/13/20 when CT showed perinephric abscess with gas formation. Drain was placed and there is concern for fistulous communication to the colon on 10/16/20 CT. Nutrition has been inadequate since admission, and Pharmacy consulted for TPN management. Per patient's son, patient has no recent weight loss and was eating a balanced diet PTA.  TPN stopped 11/29 afternoon s/p code d/t access issues, was to be resumed 11/30 however could not be started d/t access issues, for triple lumen placement by IV team today 12/1.    Glucose / Insulin: no hx DM - CBGs remain low off TPN Electrolytes: K 4.4 (goal >4 with Afib), Mag 2.6 (2g ordered), Na 145, Phos 2.9>5.9, Bicarb gtt to dec, received 2g Ca gluc + lokelma overnight Renal: SCr 2.71>3.94 LFTs / TGs:  AST/ALT large uptrend, Tbili up to 5.8, last TG wnl Prealbumin / albumin: albumin 1.1>1.4, prealbumin up 15.3c Intake / Output; MIVF: UOP down to 0.1 ml/kg/hr, drain output 34mL, chest tube output inc to 85 mL GI Imaging: 11/20 CT - large IA fluid and blood in LUQ worrisome for active bleeding, renal cysts, cannot exclude fistulous communication with large bowel  NGT to be placed for CT w/contrast 11/29 >> now with bleed (stomach, retroperitoneal), decreased abscess size with drain  Surgeries / Procedures: 11/20 - intubated, thoracentesis -- drained cloudy, yellow appearing  fluid 11/22 - VATS/decortication 11/29: Re-intubated  Central access: PICC 10/17/20, for TL placement 12/1 TPN start date: 10/17/20  Nutritional Goals (per RD rec on 11/23): 1950 - 2240 kCal, 115 - 130g protein; >1.8L fluid per day  Goal TPN rate is 90 ml/hr - will provide 122g protein, 292g CHO, and 67g SMOF lipids, for total 2150 kCal  Current Nutrition:  TPN; NPO  Plan:  Re-start TPN at goal rate 90 mL/hr Electrolytes in TPN: Hold off on K/phos for now, add back Ca, dec Mg,(to 1:1 [id number]) Continue standard MVI, remove TE and add back selenium/Zn/Cr d/t liver dysfunction  Continue ICU resistant scale SSI, will not add insulin to bag on re-start and adjust as needed based on 24h CBGs F/u surg plan D/c D10 gtt when TPN starts TPN labs in AM  Daylene Posey, PharmD Clinical Pharmacist Please check AMION for all [facility] Pharmacy numbers 10/31/2020 7:55 AM

## 2020-10-31 DEATH — deceased

## 2020-11-01 ENCOUNTER — Inpatient Hospital Stay (HOSPITAL_COMMUNITY): Payer: 59

## 2020-11-01 DIAGNOSIS — I469 Cardiac arrest, cause unspecified: Secondary | ICD-10-CM | POA: Diagnosis not present

## 2020-11-01 DIAGNOSIS — R0603 Acute respiratory distress: Secondary | ICD-10-CM | POA: Diagnosis not present

## 2020-11-01 LAB — COMPREHENSIVE METABOLIC PANEL
ALT: 3092 U/L — ABNORMAL HIGH (ref 0–44)
AST: 3711 U/L — ABNORMAL HIGH (ref 15–41)
Albumin: 1.2 g/dL — ABNORMAL LOW (ref 3.5–5.0)
Alkaline Phosphatase: 208 U/L — ABNORMAL HIGH (ref 38–126)
Anion gap: 31 — ABNORMAL HIGH (ref 5–15)
BUN: 105 mg/dL — ABNORMAL HIGH (ref 8–23)
CO2: 18 mmol/L — ABNORMAL LOW (ref 22–32)
Calcium: 6.5 mg/dL — ABNORMAL LOW (ref 8.9–10.3)
Chloride: 93 mmol/L — ABNORMAL LOW (ref 98–111)
Creatinine, Ser: 5.03 mg/dL — ABNORMAL HIGH (ref 0.61–1.24)
GFR, Estimated: 12 mL/min — ABNORMAL LOW (ref >60)
Glucose, Bld: 123 mg/dL — ABNORMAL HIGH (ref 70–99)
Potassium: 4 mmol/L (ref 3.5–5.1)
Sodium: 142 mmol/L (ref 135–145)
Total Bilirubin: 5.4 mg/dL — ABNORMAL HIGH (ref 0.3–1.2)
Total Protein: 4.2 g/dL — ABNORMAL LOW (ref 6.5–8.1)

## 2020-11-01 LAB — CBC
HCT: 24.2 % — ABNORMAL LOW (ref 39.0–52.0)
Hemoglobin: 8.2 g/dL — ABNORMAL LOW (ref 13.0–17.0)
MCH: 29.9 pg (ref 26.0–34.0)
MCHC: 33.9 g/dL (ref 30.0–36.0)
MCV: 88.3 fL (ref 80.0–100.0)
Platelets: 107 10*3/uL — ABNORMAL LOW (ref 150–400)
RBC: 2.74 MIL/uL — ABNORMAL LOW (ref 4.22–5.81)
RDW: 17.8 % — ABNORMAL HIGH (ref 11.5–15.5)
WBC: 17.7 10*3/uL — ABNORMAL HIGH (ref 4.0–10.5)
nRBC: 0.7 % — ABNORMAL HIGH (ref 0.0–0.2)

## 2020-11-01 LAB — GLUCOSE, CAPILLARY
Glucose-Capillary: 118 mg/dL — ABNORMAL HIGH (ref 70–99)
Glucose-Capillary: 121 mg/dL — ABNORMAL HIGH (ref 70–99)
Glucose-Capillary: 122 mg/dL — ABNORMAL HIGH (ref 70–99)
Glucose-Capillary: 134 mg/dL — ABNORMAL HIGH (ref 70–99)
Glucose-Capillary: 135 mg/dL — ABNORMAL HIGH (ref 70–99)

## 2020-11-01 LAB — PHOSPHORUS: Phosphorus: 6.7 mg/dL — ABNORMAL HIGH (ref 2.5–4.6)

## 2020-11-01 LAB — MAGNESIUM: Magnesium: 2.4 mg/dL (ref 1.7–2.4)

## 2020-11-01 MED ORDER — ORAL CARE MOUTH RINSE
15.0000 mL | OROMUCOSAL | Status: DC
  Administered 2020-11-01 – 2020-11-04 (×29): 15 mL via OROMUCOSAL

## 2020-11-01 MED ORDER — CHLORHEXIDINE GLUCONATE 0.12% ORAL RINSE (MEDLINE KIT)
15.0000 mL | Freq: Two times a day (BID) | OROMUCOSAL | Status: DC
  Administered 2020-11-01 – 2020-11-04 (×7): 15 mL via OROMUCOSAL

## 2020-11-01 MED ORDER — ZINC CHLORIDE 1 MG/ML IV SOLN
INTRAVENOUS | Status: AC
  Filled 2020-11-01: qty 804

## 2020-11-01 NOTE — Progress Notes (Addendum)
NAME:  Mark May, MRN:  [id number], DOB:  24-Apr-1956, LOS: 27 ADMISSION DATE:  10/10/2020, CONSULTATION DATE: 10/04/2020 REFERRING MD: Dr. Curtis Sites, CHIEF COMPLAINT: Left-sided weakness  Brief History   64 year old male with HTN and HLD who presented with left-sided weakness, noted to have right MCA occlusion status post thrombectomy and stent placement 11/5.  Hospitalization complicated by hypoxic respiratory failure secondary to pulmonary edema requiring BiPAP and lasix.  Awaiting CIR placement however, on the evening of 11/10, he was noted to become more lethargic with tachycardia and tachypnea.  Had complained of abdominal pain with some distention and chest pain which resolved after burping.  KUB obtained which was normal.  On 11/11, he was somewhat more responsive but now with increasing sCr in which lisinopril was stopped and development of fever 102.6  TRH initially consulted for help with medical management however on their evaluation, PCCM consulted given ill appearance and developing hypotension and new onset Afib with RVR found to have sepsis with RLL pneumonia likely due to aspiration started on zosyn. PCCM consulted for continue right abdominal pain and distension with concern for bowel perforation secondary to Cortrak.  On 11/13, CT abd/ pelvis showed large right perinephric fluid collection and pockets of air concerning for an infectious process/abscess. Underwent CT guided drain placement of right retroperitoneal abscess.  On 11/15, he developed melena with anemia.  GI consulted with plans to medically manage and deferred EGD.  Surgery was consulted on 11/17 with imaging concerning for possible contained bowel perforation.  On 11/20, he developed respiratory distress requiring intubation. Underwent thoracentesis on 11/20.  Hypotensive with Hgb drob on 11/21, CT ABD/Pelvis w/ concern for active RP bleed.  Subsequently, developed hemothorax requiring OR for VATS on 11/22. Developed  progressive hypotension 11/29 and suffered cardiac arrest in setting of RP bleed requiring intubation.   Past Medical History  Hypertension Peptic ulcer disease, previous partial gastrectomy (in [place] / 1990) Gout BPH Sciatica   Significant Hospital Events   11/05 Mechanical thrombectomy of right MCA and stent 11/5 11/11 PCCM reconsulted  11/12 Cortrak placed 11/14 CT guided drainage for R RP abscess with JP 11/17 PCCM reconsulted 11/18 CCM recalled by rapid response, fever, resp distress, CXR new left infiltrates with left pleural effusion 11/20 Thoracentesis  11/21 Hypotensive with Hgb drop to 6.1 s/p 2 units PRBC, CT ABD with concerning for active bleeding 11/22 To OR for VATS, decortication  11/29 Hypotension, cardiac arrest x20 min, VDRF, profound acidosis due to new retroperitoneal bleed, severe anemia 11/29 EGD >> blood in all areas of the gastric remnant with no clearly defined source of bleeding  Consults:  IR Neurology PCCM 11/5-11/8; 11/11; 11/16-11/17, 11/18  [facility] Urology 11/13 GI 11/15 CCS 11/17  Procedures:  R Femoral Sheath 11/6 >> 11/6 ETT 11/20 >> 11/26 RUE PICC 11/17 >>  ETT 11/29 >>   Significant Diagnostic Tests:  11/5 CT head >> No acute abnormality.  11/5 CTA head / neck >> Severe distal right M1 stenosis or subocclusive thrombus, Right MCA infarct with extensive penumbra, 4 mm right supraclinoid ICA aneurysm. Widely patent cervical carotid and vertebral arteries. 11/5 Postprocedure CT head >> Hypodensity in the right lateral temporal lobe now shows mild hemorrhage or contrast enhancement. This is most consistent with acute infarct. There has been interval stenting of the right middle cerebral artery. 11/6 MRI brain >> Fairly extensive patchy acute/early subacute infarcts within the right MCA/watershed territory.  Additional small acute/early subacute infarcts within the dorsal right thalamus and  left basal ganglia. 11/6  MRA head >>  Interval stenting  of the M1/M2 right middle cerebral artery. Flow related signal is present proximal and distal to the stent suggestive of stent patency. 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 11/6 TTE >> LVEF 70-75%, no RWMA, Grade II diastolic dysfunction, elevated LA pressure 11/13 CT ABD/Pelvis >> large right perinephric fluid collection and pockets of air concerning for an infectious process/abscess. Cholelithiasis. Nonobstructing right renal calculi. No hydronephrosis. Colonic diverticulosis. No bowel obstruction. Partially visualized small bilateral pleural effusions with complete consolidative changes of the visualized lower lobes. 11/16 CT ABD/Pelvis >> interval placement of drainage catheter into the right perinephric space in the area of previously seen gas and fluid collection. Fluid collection has decreased since prior study. There is now contrast material seen within the perinephric space and extending into the right paracolic gutter. This is concerning for possible fistulous communication to the colon. May consider contrast injection through the drainage catheter under fluoroscopic guidance to assess for enteric fistula. Moderate bilateral pleural effusions with bibasilar atelectasis or consolidation, unchanged.  Cholelithiasis.  NG tube in the stomach.  Aortic atherosclerosis.  Small to moderate free fluid in the pelvis. 11/20 L Thoracentesis >> 650 ml cloudy yellow fluid 11/21 CT abd/ pelvis >> very large area of intra-abdominal fluid and blood within the left upper quadrant, with an appearance worrisome for active bleeding.  Stable position of the right-sided percutaneous drainage catheter with a moderate amount of surrounding contained free air, fluid and inflammatory fat stranding. Fistulous communication with the adjacent portion of large bowel cannot be excluded. Moderate to marked amount of para muscular subcutaneous inflammatory fat stranding along the lateral  aspects of the left abdominal and pelvic walls. 11/30 CT abdomen pelvis chest >> RLQ percutaneous drainage catheter in place, decreased size abscess cavity. This is adjacent to the distal second portion of the duodenum but no evidence of extravasation of contrast.  Right retroperitoneal hematoma along the course of the drainage catheter 4.9 x 19.9 x 5.4 cm (right psoas, iliacus).  Left greater than right pleural effusions with a left-sided chest tube in place, left extrapleural collection.  Micro Data:  MRSA PCR 11/5 >> negative COVID 11/5 >> negative Flu 11/5 >> negative UC 11/10 >> neg BCx2 11/11 >> neg BC x2 11/13 >> neg Right RRP abscess 11/14 >> moderate candida, klebsiella >> R-ampicillin, S-cefazolin  BCx 2 11/19 >> negative  Left pleural fluid 11/20 >> Klebsiella >> R-ampicillin, I-unasyn, S-cefazolin, ceftriaxone, gent, imipenem  Antimicrobials:  Cefazolin 11/5 >> 11/6 Flagyl 11/11 x1 Cefepime 11/11, 11/18 >> 11/19  Vanco 11/12 >> 11/16, 11/18  Unasyn 11/17 >> 11/18  Flagyl 11/18 >> 11/19  Diflucan 11/17 >>  Zosyn 11/11 >> 11/17, 11/19 >>   Interim history/subjective:   On norepinephrine 8 (improved).  Bradycardic events reported 12/1 Hemoglobin overall stable 9.2 >> 8.8 >> 8.2 Not on any sedation  Objective   Blood pressure (!) 83/52, pulse 76, temperature 98 F (36.7 C), temperature source Axillary, resp. rate (!) 23, weight 66.8 kg, SpO2 94 %. CVP:  [3 mmHg-6 mmHg] 6 mmHg  Vent Mode: PRVC FiO2 (%):  [40 %] 40 % Set Rate:  [30 bmp] 30 bmp Vt Set:  [530 mL] 530 mL PEEP:  [5 cmH20] 5 cmH20 Plateau Pressure:  [18 cmH20-23 cmH20] 23 cmH20   Intake/Output Summary (Last 24 hours) at 11/01/2020 0953 Last data filed at 11/01/2020 0700 Gross per 24 hour  Intake 3549.62 ml  Output 30 ml  Net 3519.62 ml   Filed Weights   10/30/20 0500 10/31/20 0440 11/01/20 0438  Weight: 58.8 kg 62.4 kg 66.8 kg    Physical Exam General: Thin chronically ill man, ventilated HEENT:  Oropharynx clear, ET tube in place, some temporal wasting Neuro: Did not respond to pain, does not open eyes or track.  No sedation running CV: Regular, distant, no murmur PULM: Ventilated breath sounds, no wheezing or crackles.  Left chest tube in place GI: Abdomen somewhat firm, right-sided drain in place unchanged, hypoactive bowel sounds Extremities: Trace generalized edema Skin: No rash  CXR 12/2 reviewed by me, persistent left greater than right basilar atelectasis, small effusion, probable evolving right effusion  Resolved Hospital Problems:  Hypernatremia  Assessment & Plan:    Acute respiratory failure secondary to aspiration pneumonia with bilateral pleural effusion, left klebsiella empyema, ? Connection with abdominal process Hemothorax following thoracentesis, s/p VATS 11/22. No active bleeding after clot removal. Recurrent acute respiratory failure in setting cardiac arrest, acidosis, retroperitoneal bleed S/p thora on 11/20 with exudative properties, klebsiella in pleural culture & RP abscess.  s/post thora extravasation on CTA chest. Cardiac arrest 11/29, reintubated in setting of RP bleed. -Continue PRVC, increased minute ventilation on 12/1 to manage acidosis -Stable PEEP, FiO2 needs -Appreciate T CTS assistance, plan for chest tube removal today 12/2 -Follow intermittent chest x-ray including 12/3 -Try to progress to spontaneous breathing trials if mental status, overall hemodynamic and metabolic status will allow.  Poor candidate currently  Mixed Shock, Septic + ABLA / Hypovolemic, RLL PNA, Intra-Abdominal Abscess.   Recurrent on 11/29 due to acute increased retroperitoneal bleeding and also upper GI bleeding.  Resulting cardiac arrest, CPR 20 minutes.  Initial shock multifactorial, ABLA and septic. S/p 2 units blood on 11/21.  Klebsiella Empyema + fluid collection in abd. Recurrent episode 11/29 appears to be more likely due to acute blood loss Suspected aspiration and  intraabdominal abscess (klebsiella and candida) in setting of bowel perforation, left empyema.  No clear evidence for connection between the intra-abdominal process and pleural space -Continue diflucan (started 11/17), zosyn (started 11/19), plan for minimum 14 days therapy, but actual duration difficult to determine given perforation as source -Continue efforts to wean epinephrine, goal MAP 65 -Protonix infusion for 1 more day, plan to transition to twice daily dosing on 12/3  Intra-abdominal/retroperitoneal abscess with Contained Bowel Perforation.   Hx of PUD, Partial Gastrectomy No clear persistent connection between duodenum and abscess based on CT 11/29 -Appreciate CCS, IR assistance.  No role for surgery at this point -Antibiotics, antifungals as above  Retroperitoneal Bleed / Hematoma  4.9x19.9x5.4 cm, appears source of bleeding related to drain and RP collection, not from gastric or duodenal source -Anticoagulation is on hold -Follow with serial CBC -Follow drainage from retroperitoneal tube, gastric tube -Appreciate gastroenterology assistance  Acute encephalopathy. Currently off sedation for over 48 hours 12/2. Consider toxic metabolic especially given progressive renal failure. Certainly concerning for possible ischemic, hypoxic brain injury given CPR 12/29. -Consider MRI brain, EEG  AKI Progressive following arrest 11/29 -Temporizing with bicarb infusion -Appreciate nephrology evaluation and assistance.  No clear urgent indication for HD on 12/2 but will likely require if no improvement in renal function over the next 24 hours -Follow BMP, urine output -Renal dose medications  Hypocalcemia  -Follow, replace if indicated  PAF with RVR Currently rate controlled. Heparin infusion stopped 11/29 with acute RP bleed. 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  TPN Pain/Anxiety/Delirium protocol (if indicated): Intermittent Versed, fentanyl VAP protocol (if indicated): Oral care protocol DVT prophylaxis: SCDs for now GI prophylaxis: PPI infusion Glucose control: SSI Mobility: bed rest Code Status: full code Family Communication: Updated son and daughter at bedside 12/2 Disposition:  ICU  LABS    PULMONARY Recent Labs  Lab 11-01-20 1737 11/01/2020 2055 10/31/20 1211  PHART 7.198* 7.332* 7.539*  PCO2ART 43.7 25.8* 24.7*  PO2ART 400* 170* 70*  HCO3 17.0* 13.4* 21.1  TCO2 18*  --  22  O2SAT 100.0 98.8 96.0   CBC Recent Labs  Lab 10/31/20 0214 10/31/20 0214 10/31/20 1211 10/31/20 1426 11/01/20 0423  HGB 9.2*   < > 12.2* 8.8* 8.2*  HCT 26.3*   < > 36.0* 26.5* 24.2*  WBC 19.8*  --   --  14.6* 17.7*  PLT 147*  --   --  141* 107*   < > = values in this interval not displayed.   COAGULATION Recent Labs  Lab 2020-11-01 2100  INR 2.7*   CARDIAC  No results for input(s): TROPONINI in the last 168 hours. No results for input(s): PROBNP in the last 168 hours.  CHEMISTRY Recent Labs  Lab 10/26/20 0500 10/26/20 0500 10/27/20 0443 10/27/20 0620 10/28/20 0629 10/28/20 0629 11-01-2020 0529 01-Nov-2020 0529 November 01, 2020 1628 Nov 01, 2020 1737 10/30/20 0339 10/30/20 0339 10/30/20 0948 10/30/20 0948 10/30/20 1950 10/30/20 1950 10/31/20 0231 10/31/20 0231 10/31/20 1211 10/31/20 1426  NA 144   < > 143   < > 149*   < > 147*   < > 147*   < > 141   < > 143  --  144  --  145  --  140 145  K 2.8*   < > 3.8   < > 3.8   < > 4.0   < > 7.1*   < > 4.7   < > 4.7   < > 5.0   < > 4.4   < > 4.3 4.3  CL 106   < > 104   < > 112*   < > 111   < > 114*   < > 99  --  96*  --   94*  --  95*  --   --  95*  CO2 24   < > 26   < > 26   < > 24   < > 12*   < > 20*  --  21*  --  24  --  23  --   --  19*  GLUCOSE 165*   < > 433*   < > 123*   < > 175*   < > 236*   < > 93  --  91  --  78  --  74  --   --  75  BUN 80*   < > 66*   < > 62*   < > 56*   < > 60*   < > 68*  --  75*  --  87*  --  91*  --   --  95*  CREATININE 2.49*   < >  1.83*   < > 1.69*   < > 1.46*   < > 1.94*   < > 2.71*  --  3.04*  --  3.57*  --  3.94*  --   --  4.38*  CALCIUM 7.8*   < > 7.7*   < > 8.0*   < > 7.8*   < > 8.2*   < > 6.5*  --  6.5*  --  6.2*  --  6.4*  --   --  6.3*  MG 2.0   < >  --   --  1.9  --  2.5*  --  3.4*  --  1.9  --   --   --   --   --  2.6*  --   --   --   PHOS 2.9  --  3.0  --   --   --  2.9  --   --   --   --   --   --   --   --   --  5.9*  --   --   --    < > = values in this interval not displayed.   Estimated Creatinine Clearance: 15.7 mL/min (A) (by C-G formula based on SCr of 4.38 mg/dL (H)).  LIVER Recent Labs  Lab 10/26/20 0500 10/15/2020 0529 10/10/2020 2100 10/31/20 0231  AST 67* 89*  --  2,275*  ALT 128* 124*  --  1,447*  ALKPHOS 90 206*  --  185*  BILITOT 2.1* 1.3*  --  5.8*  PROT 5.3* 5.9*  --  4.9*  ALBUMIN 1.1* 1.4*  --  1.6*  INR  --   --  2.7*  --    INFECTIOUS Recent Labs  Lab 10/30/20 0948 10/31/20 0237 10/31/20 1158  LATICACIDVEN >11.0* 8.2* 10.8*   ENDOCRINE CBG (last 3)  Recent Labs    10/31/20 2345 11/01/20 0300 11/01/20 0728  GLUCAP 130* 134* 121*    Critical Care Time: 34 minutes   Levy Pupa, MD, PhD 11/01/2020, 9:53 AM [facility] [phone] or if no answer [phone]

## 2020-11-01 NOTE — Progress Notes (Signed)
PT Cancellation Note  Patient Details Name: Severn Goddard MRN: [id number] DOB: 02-Oct-1956   Cancelled Treatment:    Reason Eval/Treat Not Completed: Patient not medically ready (pt with code and intubated 11/29. Currently sedated on vent. Please reorder as pt appropriate)   Kadeisha Betsch B Nadira Single 11/01/2020, 10:07 AM  Merryl Hacker, PT Acute Rehabilitation Services Pager: [phone] Office: [phone]

## 2020-11-01 NOTE — Progress Notes (Signed)
OT Cancellation Note  Patient Details Name: Mark May MRN: [id number] DOB: 11-25-1956   Cancelled Treatment:    Reason Eval/Treat Not Completed: Patient not medically ready. Pt sedated on vent. Will sign off at this time. Please reorder when appropriate. Thank you.   Thornell Mule, OT/L   Acute OT Clinical Specialist Acute Rehabilitation Services Pager [phone] Office [phone]  11/01/2020, 7:36 AM

## 2020-11-01 NOTE — Progress Notes (Signed)
[facility] Gastroenterology Progress Note  Declyn Delsol 64 y.o. May 08, 1956   Subjective: Minimal melena this morning. Son in room. Nurse present.  Objective: Vital signs: Vitals:   11/01/20 1210 11/01/20 1211  BP:    Pulse: 79 78  Resp: (!) 31 (!) 32  Temp:    SpO2: (!) 89% (!) 88%  BP 98/64, T 98  Physical Exam: Gen: intubated, ill-appearing, eldlery, thin  CV: Tachy Chest: Coarse breath sounds Abd: distended, nontender, decreased bowel sounds Ext: no edema  Lab Results: Recent Labs    10/31/20 0231 10/31/20 1211 10/31/20 1426 11/01/20 0800  NA 145   < > 145 142  K 4.4   < > 4.3 4.0  CL 95*  --  95* 93*  CO2 23  --  19* 18*  GLUCOSE 74  --  75 123*  BUN 91*  --  95* 105*  CREATININE 3.94*  --  4.38* 5.03*  CALCIUM 6.4*  --  6.3* 6.5*  MG 2.6*  --   --  2.4  PHOS 5.9*  --   --  6.7*   < > = values in this interval not displayed.   Recent Labs    10/31/20 0231 11/01/20 0800  AST 2,275* 3,711*  ALT 1,447* 3,092*  ALKPHOS 185* 208*  BILITOT 5.8* 5.4*  PROT 4.9* 4.2*  ALBUMIN 1.6* 1.2*   Recent Labs    10/31/20 1426 11/01/20 0423  WBC 14.6* 17.7*  HGB 8.8* 8.2*  HCT 26.5* 24.2*  MCV 86.9 88.3  PLT 141* 107*    Minimal serosanginous output from retroperitoneal drain  Assessment/Plan: Retroperitoneal heamtoma and questionable contained duodenal perforation. Retroperitoneal drain. Hgb 8.2. Continue Protonix drip. No plans for repeat EGD unless surgery planned. Will need an UGIS when stable. Will sign off. Call if questions.   Shirley Friar 11/01/2020, 12:50 PM  Questions please call [phone] ID: Bary Castilla, male   DOB: 27-May-1956, 64 y.o.   MRN: [id number]

## 2020-11-01 NOTE — Progress Notes (Signed)
Around 1430 -- bathed pt and pt went into a-fib.... notified Dr. Delton Coombes in person and no further orders given at the moment.   Will continue to monitor HR and O2.

## 2020-11-01 NOTE — Progress Notes (Signed)
Patient ID: Mark May, male   DOB: 09-Apr-1956, 64 y.o.   MRN: [id number] S: Had chest tube removed this morning.  No other events overnight. O:BP 98/64   Pulse 78   Temp 98 F (36.7 C) (Axillary)   Resp (!) 32   Wt 66.8 kg   SpO2 (!) 88%   BMI 23.07 kg/m   Intake/Output Summary (Last 24 hours) at 11/01/2020 1316 Last data filed at 11/01/2020 1000 Gross per 24 hour  Intake 4015.38 ml  Output 56 ml  Net 3959.38 ml   Intake/Output: I/O last 3 completed shifts: In: 5524.4 [I.V.:4234.9; IV Piggyback:1289.4] Out: 50 [Urine:65; Drains:8]  Intake/Output this shift:  Total I/O In: 465.8 [I.V.:465.8] Out: 26 [Chest Tube:26] Weight change: 4.4 kg Gen: cachectic, chronically ill-appearing man on vent CVS: RRR Resp: ventilated BS bilaterally Abd: distended, firm, hypoactive bowel sounds Ext: 1+ anasarca  Recent Labs  Lab 10/26/20 0500 10/26/20 0500 10/27/20 0443 10/27/20 0620 10/27/2020 0529 10/20/2020 1628 10/08/2020 2100 10/14/2020 2100 10/30/20 0339 10/30/20 0948 10/30/20 1950 10/31/20 0231 10/31/20 1211 10/31/20 1426 11/01/20 0800  NA 144   < > 143   < > 147*   < > 145   < > 141 143 144 145 140 145 142  K 2.8*   < > 3.8   < > 4.0   < > 5.1   < > 4.7 4.7 5.0 4.4 4.3 4.3 4.0  CL 106   < > 104   < > 111   < > 101  --  99 96* 94* 95*  --  95* 93*  CO2 24   < > 26   < > 24   < > 15*  --  20* 21* 24 23  --  19* 18*  GLUCOSE 165*   < > 433*   < > 175*   < > 265*  --  93 91 78 74  --  75 123*  BUN 80*   < > 66*   < > 56*   < > 63*  --  68* 75* 87* 91*  --  95* 105*  CREATININE 2.49*   < > 1.83*   < > 1.46*   < > 2.41*  --  2.71* 3.04* 3.57* 3.94*  --  4.38* 5.03*  ALBUMIN 1.1*  --   --   --  1.4*  --   --   --   --   --   --  1.6*  --   --  1.2*  CALCIUM 7.8*   < > 7.7*   < > 7.8*   < > 6.4*  --  6.5* 6.5* 6.2* 6.4*  --  6.3* 6.5*  PHOS 2.9  --  3.0  --  2.9  --   --   --   --   --   --  5.9*  --   --  6.7*  AST 67*  --   --   --  89*  --   --   --   --   --   --  2,275*  --   --   3,711*  ALT 128*  --   --   --  124*  --   --   --   --   --   --  1,447*  --   --  3,092*   < > = values in this interval not displayed.   Liver Function Tests: Recent Labs  Lab 10/11/2020  8295 10/31/20 0231 11/01/20 0800  AST 89* 2,275* 3,711*  ALT 124* 1,447* 3,092*  ALKPHOS 206* 185* 208*  BILITOT 1.3* 5.8* 5.4*  PROT 5.9* 4.9* 4.2*  ALBUMIN 1.4* 1.6* 1.2*   No results for input(s): LIPASE, AMYLASE in the last 168 hours. No results for input(s): AMMONIA in the last 168 hours. CBC: Recent Labs  Lab 10/27/20 0620 10/28/20 0444 10/28/20 1537 10/28/20 1537 10/02/2020 0529 10/09/2020 1735 10/30/20 0948 10/30/20 0948 10/30/20 1912 10/30/20 1912 10/31/20 0214 10/31/20 0214 10/31/20 1211 10/31/20 1426 11/01/20 0423  WBC 11.2*   < > 9.6   < > 12.5*   < > 28.5*   < > 23.6*   < > 19.8*  --   --  14.6* 17.7*  NEUTROABS 9.4*  --  7.6  --  10.0*  --   --   --   --   --   --   --   --   --   --   HGB 8.7*   < > 8.0*   < > 8.2*   < > 6.3*   < > 9.2*   < > 9.2*   < > 12.2* 8.8* 8.2*  HCT 26.7*   < > 24.7*   < > 26.0*   < > 18.6*   < > 26.8*   < > 26.3*   < > 36.0* 26.5* 24.2*  MCV 85.6   < > 86.7   < > 87.5   < > 86.5  --  83.2  --  82.4  --   --  86.9 88.3  PLT 229   < > 268   < > 303   < > 140*   < > 148*   < > 147*  --   --  141* 107*   < > = values in this interval not displayed.   Cardiac Enzymes: No results for input(s): CKTOTAL, CKMB, CKMBINDEX, TROPONINI in the last 168 hours. CBG: Recent Labs  Lab 10/31/20 1617 10/31/20 1942 10/31/20 2345 11/01/20 0300 11/01/20 0728  GLUCAP 75 102* 130* 134* 121*    Iron Studies: No results for input(s): IRON, TIBC, TRANSFERRIN, FERRITIN in the last 72 hours. Studies/Results: DG CHEST PORT 1 VIEW  Result Date: 11/01/2020 CLINICAL DATA:  Pleural effusion. EXAM: PORTABLE CHEST 1 VIEW COMPARISON:  Chest x-ray 10/31/2020.  Chest CT 10/30/2020. FINDINGS: Endotracheal tube, NG tube, right PICC line in stable position. Left chest tube in  stable position. Heart size stable. Persistent atelectasis/infiltrate left lung base. Persistent small left pleural effusion. New layering right pleural effusion. Underlying right base atelectasis/infiltrate cannot be excluded. No pneumothorax. IMPRESSION: 1. Lines and tubes including left chest tube in stable position. No pneumothorax. 2. Persistent atelectasis/infiltrate left lung base. Persistent small left pleural effusion. 3. New layering right pleural effusion. Underlying right base atelectasis/infiltrate cannot be excluded. Electronically Signed   By: Marcello Moores  Register   On: 11/01/2020 06:10   DG CHEST PORT 1 VIEW  Result Date: 10/31/2020 CLINICAL DATA:  Pleural effusion EXAM: PORTABLE CHEST 1 VIEW COMPARISON:  10/30/2020 FINDINGS: Endotracheal tube is seen 4.7 cm above the carina. Nasogastric tube extends into the upper abdomen beyond the margin of the examination. Right upper extremity PICC line with its tip within the right atrium and left large bore chest tube at the left apex are all unchanged. Lung volumes are small. There is progressive retrocardiac opacification likely representing atelectasis or posteriorly layering pleural fluid in this location. There is stable lateral pleural  thickening within the left hemithorax. There no pneumothorax. Minimal right basilar atelectasis. No pleural effusion on the right. Cardiac size within normal limits. Pulmonary vascularity is normal. IMPRESSION: Stable support lines and tubes. Stable left lateral pleural thickening. Progressive retrocardiac opacification likely related to retrocardiac atelectasis, posterior layering small pleural fluid, or a combination there of. Electronically Signed   By: Fidela Salisbury MD   On: 10/31/2020 06:23   [place] EKG SITE RITE  Result Date: 10/31/2020 If Site Rite image not attached, placement could not be confirmed due to current cardiac rhythm.  .  stroke: mapping our early stages of recovery book   Does not apply Once  .  chlorhexidine gluconate (MEDLINE KIT)  15 mL Mouth Rinse BID  . Chlorhexidine Gluconate Cloth  6 each Topical Daily  . insulin aspart  3-9 Units Subcutaneous Q4H  . mouth rinse  15 mL Mouth Rinse 10 times per day  . [START ON 11/02/2020] pantoprazole  40 mg Intravenous Q12H  . sodium chloride flush  10-40 mL Intracatheter Q12H  . sodium chloride flush  10-40 mL Intracatheter Q12H  . sodium chloride flush  5 mL Intracatheter Q8H    BMET    Component Value Date/Time   NA 142 11/01/2020 0800   NA 140 03/09/2018 1620   NA 136 01/06/2015 1448   K 4.0 11/01/2020 0800   K 3.6 01/06/2015 1448   CL 93 (L) 11/01/2020 0800   CL 99 01/06/2015 1448   CO2 18 (L) 11/01/2020 0800   CO2 28 01/06/2015 1448   GLUCOSE 123 (H) 11/01/2020 0800   GLUCOSE 154 (H) 01/06/2015 1448   BUN 105 (H) 11/01/2020 0800   BUN 12 03/09/2018 1620   BUN 12 01/06/2015 1448   CREATININE 5.03 (H) 11/01/2020 0800   CREATININE 0.88 01/06/2015 1448   CALCIUM 6.5 (L) 11/01/2020 0800   CALCIUM 8.7 01/06/2015 1448   GFRNONAA 12 (L) 11/01/2020 0800   GFRNONAA >60 01/06/2015 1448   GFRAA 113 03/09/2018 1620   GFRAA >60 01/06/2015 1448   CBC    Component Value Date/Time   WBC 17.7 (H) 11/01/2020 0423   RBC 2.74 (L) 11/01/2020 0423   HGB 8.2 (L) 11/01/2020 0423   HGB 15.9 03/09/2018 1620   HCT 24.2 (L) 11/01/2020 0423   HCT 46.9 03/09/2018 1620   PLT 107 (L) 11/01/2020 0423   PLT 331 03/09/2018 1620   MCV 88.3 11/01/2020 0423   MCV 90 03/09/2018 1620   MCV 80 01/06/2015 1448   MCH 29.9 11/01/2020 0423   MCHC 33.9 11/01/2020 0423   RDW 17.8 (H) 11/01/2020 0423   RDW 14.7 03/09/2018 1620   RDW 17.1 (H) 01/06/2015 1448   LYMPHSABS 1.3 10/24/2020 0529   LYMPHSABS 1.9 03/09/2018 1620   LYMPHSABS 0.9 (L) 01/06/2015 1448   MONOABS 0.7 10/21/2020 0529   MONOABS 0.5 01/06/2015 1448   EOSABS 0.2 10/04/2020 0529   EOSABS 0.1 03/09/2018 1620   EOSABS 0.0 01/06/2015 1448   BASOSABS 0.0 10/19/2020 0529   BASOSABS 0.0  03/09/2018 1620   BASOSABS 0.1 01/06/2015 1448    Assessment/Plan: 1.  AKI, oliguric- presumably due to ischemic ATN in setting of shock and ABLA.  Renal dose meds.  No emergent indication for dialysis, however if he remains anuric and worsening electrolytes will likely initiate RRT in the next 24 hours.   1. Check bladder scans 2. No evidence of obstruction on CT of abd/pelvis on 10/21/20 but may need to repeat. 3. Remains anuric  and discussed with PCCM staff and patient's son that if they wish to continue with aggressive measures he will likely need to start CRRT in the next 24 hours. 4. Discussed with the son that this would be on a short term basis (5-7 days) and if he was not improving clinically we would stop due to poor overall prognosis. 2. Metabolic acidosis- due to #1.  Currently on bicarb drip but CO2 dropping. 3. Acute respiratory failure due to aspiration pneumonia- on vent per PCCM 4. Shock- multifactorial with infection and ABLA as well as A fib and RVR.  Currently on levophed drip.  Wean per PCCM 5. Klebsiella infection- from pleural culture and retroperitoneal abscess.  Currently on Zosyn.  Need to adjust dose due to AKI and GFR <20 6. ABLA from GI bleed- cont to transfuse as needed 7. Duodenal perforation- on abx.  GI and surgery following. 8. PAF with RVR- currently rate controlled with amiodarone.  No heparin due to GIB.   9. Left hemothorax s/p VATS. 10. Severe protein malnutrition- per PCCM 11. 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 12. Disposition- poor overall prognosis.  Continue with aggressive measures for now but may benefit from Palliative care consult to help set goals/limits of care.   Donetta Potts, MD [employer] [phone]

## 2020-11-01 NOTE — Progress Notes (Signed)
Around 1200 - Order given to pull CT out ... Called [place], [place] and notified him that there was sanguinous drainage from CT site and asked if they still wanted me to pull it out.... VO given to pull CT out.   CT pulled out along w/Rachel J RN .... Sat dropped from 97% to 89%... Notified RT and FIO2 bumped to 50% ... O2 in the Mid 90s at the moment.   Will cont to monitor O2 Sat and Resp.

## 2020-11-01 NOTE — Progress Notes (Signed)
3 Days Post-Op  Subjective: Patient on vent.  Not really responsive this am.  Daughter at bedside.  No bleeding overnight per RN.  ROS: See above, otherwise other systems negative  Objective: Vital signs in last 24 hours: Temp:  [96.5 F (35.8 C)-98.9 F (37.2 C)] 98 F (36.7 C) (12/02 0730) Pulse Rate:  [51-93] 81 (12/02 0730) Resp:  [16-40] 34 (12/02 0730) BP: (74-127)/(49-77) 96/56 (12/02 0730) SpO2:  [90 %-100 %] 94 % (12/02 0730) FiO2 (%):  [40 %] 40 % (12/02 0730) Weight:  [66.8 kg] 66.8 kg (12/02 0438) Last BM Date: 10/31/20  Intake/Output from previous day: 12/01 0701 - 12/02 0700 In: 3342.2 [I.V.:3242.2; IV Piggyback:100] Out: 38 [Urine:30; Drains:8] Intake/Output this shift: No intake/output data recorded.  PE: Gen: on vent, critically ill Abd: soft, but distended (not tympanitic though), NGT not to suction but when aspirated 1-2 cc of bile present.  No blood noted.  JP drain with no output currently, residual dark brown output noted.  Output down to 38ccs yesterday.  Lab Results:  Recent Labs    10/31/20 1426 11/01/20 0423  WBC 14.6* 17.7*  HGB 8.8* 8.2*  HCT 26.5* 24.2*  PLT 141* 107*   BMET Recent Labs    10/31/20 0231 10/31/20 0231 10/31/20 1211 10/31/20 1426  NA 145   < > 140 145  K 4.4   < > 4.3 4.3  CL 95*  --   --  95*  CO2 23  --   --  19*  GLUCOSE 74  --   --  75  BUN 91*  --   --  95*  CREATININE 3.94*  --   --  4.38*  CALCIUM 6.4*  --   --  6.3*   < > = values in this interval not displayed.   PT/INR Recent Labs    10/19/2020 2100  LABPROT 28.1*  INR 2.7*   CMP     Component Value Date/Time   NA 145 10/31/2020 1426   NA 140 03/09/2018 1620   NA 136 01/06/2015 1448   K 4.3 10/31/2020 1426   K 3.6 01/06/2015 1448   CL 95 (L) 10/31/2020 1426   CL 99 01/06/2015 1448   CO2 19 (L) 10/31/2020 1426   CO2 28 01/06/2015 1448   GLUCOSE 75 10/31/2020 1426   GLUCOSE 154 (H) 01/06/2015 1448   BUN 95 (H) 10/31/2020 1426   BUN  12 03/09/2018 1620   BUN 12 01/06/2015 1448   CREATININE 4.38 (H) 10/31/2020 1426   CREATININE 0.88 01/06/2015 1448   CALCIUM 6.3 (LL) 10/31/2020 1426   CALCIUM 8.7 01/06/2015 1448   PROT 4.9 (L) 10/31/2020 0231   PROT 7.4 03/09/2018 1620   PROT 8.0 01/06/2015 1448   ALBUMIN 1.6 (L) 10/31/2020 0231   ALBUMIN 4.8 03/09/2018 1620   ALBUMIN 4.2 01/06/2015 1448   AST 2,275 (H) 10/31/2020 0231   AST 32 01/06/2015 1448   ALT 1,447 (H) 10/31/2020 0231   ALT 44 01/06/2015 1448   ALKPHOS 185 (H) 10/31/2020 0231   ALKPHOS 123 (H) 01/06/2015 1448   BILITOT 5.8 (H) 10/31/2020 0231   BILITOT 0.6 03/09/2018 1620   BILITOT 0.3 01/06/2015 1448   GFRNONAA 14 (L) 10/31/2020 1426   GFRNONAA >60 01/06/2015 1448   GFRAA 113 03/09/2018 1620   GFRAA >60 01/06/2015 1448   Lipase  No results found for: LIPASE     Studies/Results: DG CHEST PORT 1 VIEW  Result Date: 11/01/2020 CLINICAL  DATA:  Pleural effusion. EXAM: PORTABLE CHEST 1 VIEW COMPARISON:  Chest x-ray 10/31/2020.  Chest CT 10/30/2020. FINDINGS: Endotracheal tube, NG tube, right PICC line in stable position. Left chest tube in stable position. Heart size stable. Persistent atelectasis/infiltrate left lung base. Persistent small left pleural effusion. New layering right pleural effusion. Underlying right base atelectasis/infiltrate cannot be excluded. No pneumothorax. IMPRESSION: 1. Lines and tubes including left chest tube in stable position. No pneumothorax. 2. Persistent atelectasis/infiltrate left lung base. Persistent small left pleural effusion. 3. New layering right pleural effusion. Underlying right base atelectasis/infiltrate cannot be excluded. Electronically Signed   By: Maisie Fus  Register   On: 11/01/2020 06:10   DG CHEST PORT 1 VIEW  Result Date: 10/31/2020 CLINICAL DATA:  Pleural effusion EXAM: PORTABLE CHEST 1 VIEW COMPARISON:  10/30/2020 FINDINGS: Endotracheal tube is seen 4.7 cm above the carina. Nasogastric tube extends into the  upper abdomen beyond the margin of the examination. Right upper extremity PICC line with its tip within the right atrium and left large bore chest tube at the left apex are all unchanged. Lung volumes are small. There is progressive retrocardiac opacification likely representing atelectasis or posteriorly layering pleural fluid in this location. There is stable lateral pleural thickening within the left hemithorax. There no pneumothorax. Minimal right basilar atelectasis. No pleural effusion on the right. Cardiac size within normal limits. Pulmonary vascularity is normal. IMPRESSION: Stable support lines and tubes. Stable left lateral pleural thickening. Progressive retrocardiac opacification likely related to retrocardiac atelectasis, posterior layering small pleural fluid, or a combination there of. Electronically Signed   By: Helyn Numbers MD   On: 10/31/2020 06:23   [place] EKG SITE RITE  Result Date: 10/31/2020 If Site Rite image not attached, placement could not be confirmed due to current cardiac rhythm.   Anti-infectives: Anti-infectives (From admission, onward)   Start     Dose/Rate Route Frequency Ordered Stop   10/31/20 1400  piperacillin-tazobactam (ZOSYN) IVPB 2.25 g        2.25 g 100 mL/hr over 30 Minutes Intravenous Every 8 hours 10/31/20 0737     October 30, 2020 1400  fluconazole (DIFLUCAN) IVPB 200 mg        200 mg 100 mL/hr over 60 Minutes Intravenous Every 24 hours 10-30-20 1148     10/19/20 1400  piperacillin-tazobactam (ZOSYN) IVPB 3.375 g  Status:  Discontinued        3.375 g 12.5 mL/hr over 240 Minutes Intravenous Every 8 hours 10/19/20 1150 10/31/20 0737   10/19/20 0200  vancomycin (VANCOREADY) IVPB 500 mg/100 mL  Status:  Discontinued        500 mg 100 mL/hr over 60 Minutes Intravenous Every 12 hours 10/19/20 0025 10/19/20 0951   10/19/20 0200  metroNIDAZOLE (FLAGYL) IVPB 500 mg  Status:  Discontinued        500 mg 100 mL/hr over 60 Minutes Intravenous Every 8 hours 10/19/20  0045 10/19/20 1150   10/19/20 0115  ceFEPIme (MAXIPIME) 2 g in sodium chloride 0.9 % 100 mL IVPB  Status:  Discontinued        2 g 200 mL/hr over 30 Minutes Intravenous Every 12 hours 10/19/20 0025 10/19/20 1150   10/17/20 1400  Ampicillin-Sulbactam (UNASYN) 3 g in sodium chloride 0.9 % 100 mL IVPB  Status:  Discontinued        3 g 200 mL/hr over 30 Minutes Intravenous Every 6 hours 10/17/20 1207 10/18/20 2325   10/17/20 1400  fluconazole (DIFLUCAN) IVPB 400 mg  Status:  Discontinued  400 mg 100 mL/hr over 120 Minutes Intravenous Every 24 hours 10/17/20 1207 10/25/2020 1148   10/15/20 1630  vancomycin (VANCOREADY) IVPB 500 mg/100 mL  Status:  Discontinued        500 mg 100 mL/hr over 60 Minutes Intravenous Every 12 hours 10/15/20 0940 10/17/20 1205   10/14/20 0400  vancomycin (VANCOREADY) IVPB 500 mg/100 mL  Status:  Discontinued        500 mg 100 mL/hr over 60 Minutes Intravenous Every 24 hours 10/13/20 0233 10/15/20 0940   10/13/20 0330  vancomycin (VANCOREADY) IVPB 750 mg/150 mL        750 mg 150 mL/hr over 60 Minutes Intravenous  Once 10/13/20 0233 10/13/20 0439   10/12/20 1800  ceFEPIme (MAXIPIME) 2 g in sodium chloride 0.9 % 100 mL IVPB  Status:  Discontinued        2 g 200 mL/hr over 30 Minutes Intravenous Every 24 hours 10/11/20 1718 10/11/20 1742   10/12/20 1800  vancomycin (VANCOREADY) IVPB 750 mg/150 mL  Status:  Discontinued        750 mg 150 mL/hr over 60 Minutes Intravenous Every 24 hours 10/11/20 1724 10/11/20 1758   10/12/20 0200  metroNIDAZOLE (FLAGYL) tablet 500 mg  Status:  Discontinued        500 mg Oral Every 8 hours 10/11/20 1715 10/11/20 1758   10/12/20 0030  piperacillin-tazobactam (ZOSYN) IVPB 3.375 g  Status:  Discontinued        3.375 g 12.5 mL/hr over 240 Minutes Intravenous Every 8 hours 10/11/20 1805 10/17/20 1205   10/11/20 1830  piperacillin-tazobactam (ZOSYN) IVPB 3.375 g        3.375 g 100 mL/hr over 30 Minutes Intravenous  Once 10/11/20 1805  10/11/20 1839   10/11/20 1715  ceFEPIme (MAXIPIME) 2 g in sodium chloride 0.9 % 100 mL IVPB  Status:  Discontinued        2 g 200 mL/hr over 30 Minutes Intravenous STAT 10/11/20 1710 10/11/20 1742   10/11/20 1715  metroNIDAZOLE (FLAGYL) IVPB 500 mg  Status:  Discontinued        500 mg 100 mL/hr over 60 Minutes Intravenous STAT 10/11/20 1710 10/11/20 1807   10/11/20 1715  vancomycin (VANCOCIN) IVPB 1000 mg/200 mL premix  Status:  Discontinued        1,000 mg 200 mL/hr over 60 Minutes Intravenous STAT 10/11/20 1710 10/11/20 1804   10/04/2020 1541  ceFAZolin (ANCEF) 2-4 GM/100ML-% IVPB       Note to Pharmacy: Teofilo PodJohnson, Ericka   : cabinet override      10/17/2020 1541 10/06/20 0344       Assessment/Plan Cardiac arrest 11/29- in the setting of retroperitoneal bleeding,  Remains on pressor support this morning.  Retroperitoneal bleeding/hematoma - 4.9 x 19.9 x 5.4 cm, continue to hold heparin,this appears to have stabilized, best I can tell the source of bleeding is related to the drain and rp collection and is not from a gastric or duodenal source. I think the blood in the stomach is coming from the perforation and the connection to the bleeding area in the rp.  Hx of PUD s/p partial gastrectomy 1990 in [place]  Contained bowel perforation with intra-abdominal abscess - s/p IR drain placement 11/14 - growing klebsiella, continue drain. -suspectthatthe second portion of theduodenum is most likely site of perforation based on CT. - s/p emergent EGD at bedside 11/29 by Dr. Bosie ClosSchooler due to blood encountered during NG placement and cardiac arrest. Blood was found in  gastric remnant and one of the jejunal limbs but no source of bleeding was found. -NGT has not been to suction.  Aspirated with no return essentially.  Will DC NGT - recommendSTRICTNPO and TPN - no acute surgical needs. Hold heparin. -the perforation is contained, there is no real surgery to fix the hemorrhage.  There is a role  for surgery at some point but this is not needed right now and would not be well tolerated for what this would require.   L hemothoraxafter thoracentesis- S/p VATS 11/22 Dr. Cliffton Asters, per TCTS, pleural culture also growing klebsiella.  DC CT today per TCTS note  FEN: NPO,NGT to LIWS,IVF, PICC and TPN  VTE: SCDs, anticoagulation held in the setting of acute blood loss (hgb down to 8.2 from 8.8 yesterday) ID: current abx are zosyn and diflucan; RP cultures and pleural cultures both growing klebsiella, raising concern that the two collections are connected.  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

## 2020-11-01 NOTE — Progress Notes (Signed)
PHARMACY - TOTAL PARENTERAL NUTRITION CONSULT NOTE  Indication: Perforated duodenum  Patient Measurements: Weight: 66.8 kg (147 lb 4.3 oz)   Body mass index is 23.07 kg/m. Usual Weight: 120-125 lbs  Assessment:  64 YOM presented on 10-26-20 with right MCA occlusion and underwent revascularization. PMH significant for PUD post partial gastrectomy in 1990. Patient was started on a dysphagia diet on 10/08/20 and also transferred out of the ICU. Post-op course complicated by Afib RVR, aspiration PNA/cellulitis and AKI. He was then started on TF on 10/12/20, which ended on 10/13/20 when CT showed perinephric abscess with gas formation. Drain was placed and there is concern for fistulous communication to the colon on 10/16/20 CT. Nutrition has been inadequate since admission, and Pharmacy consulted for TPN management. Per patient's son, patient has no recent weight loss and was eating a balanced diet PTA.  TPN stopped 11/29 afternoon s/p code d/t access issues, was to be resumed 11/30 however could not be started d/t access issues, triple lumen placed 12/1 and TPN restarted.    Glucose / Insulin: no hx DM - CBGs 100-120s with TPN re-start, 6 units insulin used s/p re-start Electrolytes: K 4 (goal >4 with Afib), Mag 2.4 (2g ordered), Na 145>142, Phos 2.9>6.7, Bicarb gtt @50  Renal: SCr 3.94>5.03 LFTs / TGs:  AST/ALT large uptrend and increasing to 3,711/3,092, Tbili up to 5.8>5.4, last TG wnl Prealbumin / albumin: albumin 1.1>1.2, prealbumin up 15.3c Intake / Output; MIVF: UOP down to 0 ml/kg/hr, drain output 88mL, chest tube out  GI Imaging: 11/20 CT - large IA fluid and blood in LUQ worrisome for active bleeding, renal cysts, cannot exclude fistulous communication with large bowel  NGT to be placed for CT w/contrast 11/29 >> now with bleed (stomach, retroperitoneal), decreased abscess size with drain  Surgeries / Procedures: 11/20 - intubated, thoracentesis -- drained 12/20 cloudy, yellow appearing  fluid 11/22 - VATS/decortication 11/29: Re-intubated  Central access: PICC 10/17/20,TL placement 12/1 TPN start date: 10/17/20  Nutritional Goals (per RD rec on 11/23): 1950 - 2240 kCal, 115 - 130g protein; >1.8L fluid per day  Goal TPN rate is 75 ml/hr - will provide 120g protein, 270g CHO, and 61g SMOF lipids, for total 2012 kCal  Current Nutrition:  TPN; NPO  Plan:  Adjust TPN to goal rate 75 mL/hr (Using Clinisol 15% d/t shortage) Electrolytes in TPN: Cont dec Mg, hold phos, add back slight amount of K,(to 1:2 12/23) Continue standard MVI, remove TE and add back selenium/Zn/Cr d/t liver dysfunction (watch further downtrend of tbili with increasing LFTs before adding back, can consider 1/2 TE) Continue ICU resistant scale SSI, adjust as needed based on 24h CBGs and insulin use Bmp/Mg/Phos in AM  [email or web address], PharmD Clinical Pharmacist Please check AMION for all [facility] Pharm[facility] numbers 11/01/2020 10:02 AM

## 2020-11-01 NOTE — Progress Notes (Signed)
[address]       [place] [address]             [phone]      3 Days Post-Op Procedure(s) (LRB): ESOPHAGOGASTRODUODENOSCOPY (EGD) (N/A) Subjective: Sedated on vent  Objective: Vital signs in last 24 hours: Temp:  [96.5 F (35.8 C)-98.9 F (37.2 C)] 98.6 F (37 C) (12/02 0310) Pulse Rate:  [51-93] 86 (12/02 0600) Cardiac Rhythm: Normal sinus rhythm (12/02 0400) Resp:  [16-40] 30 (12/02 0600) BP: (74-127)/(49-77) 103/53 (12/02 0600) SpO2:  [90 %-100 %] 94 % (12/02 0600) FiO2 (%):  [40 %] 40 % (12/02 0214) Weight:  [66.8 kg] 66.8 kg (12/02 0438)  Hemodynamic parameters for last 24 hours: CVP:  [3 mmHg-10 mmHg] 4 mmHg  Intake/Output from previous day: 12/01 0701 - 12/02 0700 In: 3342.2 [I.V.:3242.2; IV Piggyback:100] Out: 38 [Urine:30; Drains:8] Intake/Output this shift: No intake/output data recorded.   Vent Mode: PRVC FiO2 (%):  [40 %] 40 % Set Rate:  [30 bmp] 30 bmp Vt Set:  [530 mL] 530 mL PEEP:  [5 cmH20] 5 cmH20 Plateau Pressure:  [18 cmH20-23 cmH20] 23 cmH20    General appearance: sedated on vent, non responsive to verbal stimuli Heart: regular rate and rhythm Lungs: coarse Abdomen: firm  Lab Results: Recent Labs    10/31/20 1426 11/01/20 0423  WBC 14.6* 17.7*  HGB 8.8* 8.2*  HCT 26.5* 24.2*  PLT 141* 107*   BMET:  Recent Labs    10/31/20 0231 10/31/20 0231 10/31/20 1211 10/31/20 1426  NA 145   < > 140 145  K 4.4   < > 4.3 4.3  CL 95*  --   --  95*  CO2 23  --   --  19*  GLUCOSE 74  --   --  75  BUN 91*  --   --  95*  CREATININE 3.94*  --   --  4.38*  CALCIUM 6.4*  --   --  6.3*   < > = values in this interval not displayed.    PT/INR:  Recent Labs    10/17/2020 2100  LABPROT 28.1*  INR 2.7*   ABG    Component Value Date/Time   PHART 7.539 (H) 10/31/2020 1211   HCO3 21.1 10/31/2020 1211   TCO2 22 10/31/2020 1211   ACIDBASEDEF 11.4 (H) 10/10/2020 2055   O2SAT 96.0 10/31/2020 1211   CBG (last 3)   Recent Labs    10/31/20 1942 10/31/20 2345 11/01/20 0300  GLUCAP 102* 130* 134*    Meds Scheduled Meds: .  stroke: mapping our early stages of recovery book   Does not apply Once  . Chlorhexidine Gluconate Cloth  6 each Topical Daily  . insulin aspart  3-9 Units Subcutaneous Q4H  . mouth rinse  15 mL Mouth Rinse BID  . [START ON 11/02/2020] pantoprazole  40 mg Intravenous Q12H  . sodium chloride flush  10-40 mL Intracatheter Q12H  . sodium chloride flush  10-40 mL Intracatheter Q12H  . sodium chloride flush  5 mL Intracatheter Q8H   Continuous Infusions: . sodium chloride Stopped (10/02/2020 1300)  . amiodarone Stopped (10/31/20 1306)  . fluconazole (DIFLUCAN) IV 200 mg (10/31/20 1230)  . norepinephrine (LEVOPHED) Adult infusion 8 mcg/min (11/01/20 0600)  . pantoprozole (PROTONIX) infusion 8 mg/hr (11/01/20 0600)  . piperacillin-tazobactam (ZOSYN)  IV 2.25 g (11/01/20 0629)  . sodium bicarbonate (isotonic) 150 mEq in D5W 1000 mL infusion 50 mL/hr at  11/01/20 0600  . TPN ADULT (ION) 90 mL/hr at 11/01/20 0600   PRN Meds:.sodium chloride, acetaminophen, fentaNYL (SUBLIMAZE) injection, fentaNYL (SUBLIMAZE) injection, midazolam, sodium chloride flush, sodium chloride flush  Xrays DG CHEST PORT 1 VIEW  Result Date: 11/01/2020 CLINICAL DATA:  Pleural effusion. EXAM: PORTABLE CHEST 1 VIEW COMPARISON:  Chest x-ray 10/31/2020.  Chest CT 10/30/2020. FINDINGS: Endotracheal tube, NG tube, right PICC line in stable position. Left chest tube in stable position. Heart size stable. Persistent atelectasis/infiltrate left lung base. Persistent small left pleural effusion. New layering right pleural effusion. Underlying right base atelectasis/infiltrate cannot be excluded. No pneumothorax. IMPRESSION: 1. Lines and tubes including left chest tube in stable position. No pneumothorax. 2. Persistent atelectasis/infiltrate left lung base. Persistent small left pleural effusion. 3. New layering right pleural  effusion. Underlying right base atelectasis/infiltrate cannot be excluded. Electronically Signed   By: Maisie Fus  Register   On: 11/01/2020 06:10   DG CHEST PORT 1 VIEW  Result Date: 10/31/2020 CLINICAL DATA:  Pleural effusion EXAM: PORTABLE CHEST 1 VIEW COMPARISON:  10/30/2020 FINDINGS: Endotracheal tube is seen 4.7 cm above the carina. Nasogastric tube extends into the upper abdomen beyond the margin of the examination. Right upper extremity PICC line with its tip within the right atrium and left large bore chest tube at the left apex are all unchanged. Lung volumes are small. There is progressive retrocardiac opacification likely representing atelectasis or posteriorly layering pleural fluid in this location. There is stable lateral pleural thickening within the left hemithorax. There no pneumothorax. Minimal right basilar atelectasis. No pleural effusion on the right. Cardiac size within normal limits. Pulmonary vascularity is normal. IMPRESSION: Stable support lines and tubes. Stable left lateral pleural thickening. Progressive retrocardiac opacification likely related to retrocardiac atelectasis, posterior layering small pleural fluid, or a combination there of. Electronically Signed   By: Helyn Numbers MD   On: 10/31/2020 06:23   [place] EKG SITE RITE  Result Date: 10/31/2020 If Site Rite image not attached, placement could not be confirmed due to current cardiac rhythm.   Assessment/Plan: S/P Procedure(s) (LRB): ESOPHAGOGASTRODUODENOSCOPY (EGD) (N/A)  1 scant bloody CT drainage, CXR on left shows stable lung fields, right shows increase in effusion and infilt. - will d/c left CT today 2 med management per primary/consultants   LOS: 27 days    Rowe Clack  PA-C Pager [phone] 11/01/2020

## 2020-11-02 ENCOUNTER — Inpatient Hospital Stay (HOSPITAL_COMMUNITY): Payer: 59

## 2020-11-02 DIAGNOSIS — I6601 Occlusion and stenosis of right middle cerebral artery: Secondary | ICD-10-CM | POA: Diagnosis not present

## 2020-11-02 LAB — RENAL FUNCTION PANEL
Albumin: <1 g/dL — ABNORMAL LOW (ref 3.5–5.0)
Anion gap: 27 — ABNORMAL HIGH (ref 5–15)
BUN: 123 mg/dL — ABNORMAL HIGH (ref 8–23)
CO2: 21 mmol/L — ABNORMAL LOW (ref 22–32)
Calcium: 7.1 mg/dL — ABNORMAL LOW (ref 8.9–10.3)
Chloride: 91 mmol/L — ABNORMAL LOW (ref 98–111)
Creatinine, Ser: 5.41 mg/dL — ABNORMAL HIGH (ref 0.61–1.24)
GFR, Estimated: 11 mL/min — ABNORMAL LOW (ref >60)
Glucose, Bld: 130 mg/dL — ABNORMAL HIGH (ref 70–99)
Phosphorus: 4.5 mg/dL (ref 2.5–4.6)
Potassium: 3.3 mmol/L — ABNORMAL LOW (ref 3.5–5.1)
Sodium: 139 mmol/L (ref 135–145)

## 2020-11-02 LAB — BPAM RBC
Blood Product Expiration Date: 202112062359
Blood Product Expiration Date: 202112072359
Blood Product Expiration Date: 202112222359
Blood Product Expiration Date: 202112252359
Blood Product Expiration Date: 202112252359
Blood Product Expiration Date: 202201032359
ISSUE DATE / TIME: 202111291848
ISSUE DATE / TIME: 202111291848
ISSUE DATE / TIME: 202111301149
ISSUE DATE / TIME: 202111301403
Unit Type and Rh: 7300
Unit Type and Rh: 7300
Unit Type and Rh: 7300
Unit Type and Rh: 7300
Unit Type and Rh: 7300
Unit Type and Rh: 7300

## 2020-11-02 LAB — CBC WITH DIFFERENTIAL/PLATELET
Abs Immature Granulocytes: 2.24 10*3/uL — ABNORMAL HIGH (ref 0.00–0.07)
Basophils Absolute: 0.1 10*3/uL (ref 0.0–0.1)
Basophils Relative: 0 %
Eosinophils Absolute: 0.1 10*3/uL (ref 0.0–0.5)
Eosinophils Relative: 1 %
HCT: 24.6 % — ABNORMAL LOW (ref 39.0–52.0)
Hemoglobin: 8.7 g/dL — ABNORMAL LOW (ref 13.0–17.0)
Immature Granulocytes: 18 %
Lymphocytes Relative: 6 %
Lymphs Abs: 0.8 10*3/uL (ref 0.7–4.0)
MCH: 31.1 pg (ref 26.0–34.0)
MCHC: 35.4 g/dL (ref 30.0–36.0)
MCV: 87.9 fL (ref 80.0–100.0)
Monocytes Absolute: 0.5 10*3/uL (ref 0.1–1.0)
Monocytes Relative: 4 %
Neutro Abs: 9 10*3/uL — ABNORMAL HIGH (ref 1.7–7.7)
Neutrophils Relative %: 71 %
Platelets: 69 10*3/uL — ABNORMAL LOW (ref 150–400)
RBC: 2.8 MIL/uL — ABNORMAL LOW (ref 4.22–5.81)
RDW: 17.6 % — ABNORMAL HIGH (ref 11.5–15.5)
WBC: 12.7 10*3/uL — ABNORMAL HIGH (ref 4.0–10.5)
nRBC: 1.8 % — ABNORMAL HIGH (ref 0.0–0.2)

## 2020-11-02 LAB — TYPE AND SCREEN
ABO/RH(D): B POS
Antibody Screen: NEGATIVE
Unit division: 0
Unit division: 0
Unit division: 0
Unit division: 0
Unit division: 0
Unit division: 0

## 2020-11-02 LAB — CBC
HCT: 23.6 % — ABNORMAL LOW (ref 39.0–52.0)
Hemoglobin: 8.2 g/dL — ABNORMAL LOW (ref 13.0–17.0)
MCH: 29.9 pg (ref 26.0–34.0)
MCHC: 34.7 g/dL (ref 30.0–36.0)
MCV: 86.1 fL (ref 80.0–100.0)
Platelets: 81 10*3/uL — ABNORMAL LOW (ref 150–400)
RBC: 2.74 MIL/uL — ABNORMAL LOW (ref 4.22–5.81)
RDW: 17.2 % — ABNORMAL HIGH (ref 11.5–15.5)
WBC: 14.8 10*3/uL — ABNORMAL HIGH (ref 4.0–10.5)
nRBC: 1.6 % — ABNORMAL HIGH (ref 0.0–0.2)

## 2020-11-02 LAB — PROTIME-INR
INR: 1.9 — ABNORMAL HIGH (ref 0.8–1.2)
Prothrombin Time: 21.4 seconds — ABNORMAL HIGH (ref 11.4–15.2)

## 2020-11-02 LAB — GLUCOSE, CAPILLARY
Glucose-Capillary: 111 mg/dL — ABNORMAL HIGH (ref 70–99)
Glucose-Capillary: 122 mg/dL — ABNORMAL HIGH (ref 70–99)
Glucose-Capillary: 127 mg/dL — ABNORMAL HIGH (ref 70–99)
Glucose-Capillary: 131 mg/dL — ABNORMAL HIGH (ref 70–99)
Glucose-Capillary: 147 mg/dL — ABNORMAL HIGH (ref 70–99)
Glucose-Capillary: 172 mg/dL — ABNORMAL HIGH (ref 70–99)

## 2020-11-02 LAB — BASIC METABOLIC PANEL
Anion gap: 30 — ABNORMAL HIGH (ref 5–15)
BUN: 123 mg/dL — ABNORMAL HIGH (ref 8–23)
CO2: 19 mmol/L — ABNORMAL LOW (ref 22–32)
Calcium: 7.1 mg/dL — ABNORMAL LOW (ref 8.9–10.3)
Chloride: 90 mmol/L — ABNORMAL LOW (ref 98–111)
Creatinine, Ser: 5.51 mg/dL — ABNORMAL HIGH (ref 0.61–1.24)
GFR, Estimated: 11 mL/min — ABNORMAL LOW (ref >60)
Glucose, Bld: 130 mg/dL — ABNORMAL HIGH (ref 70–99)
Potassium: 3.6 mmol/L (ref 3.5–5.1)
Sodium: 139 mmol/L (ref 135–145)

## 2020-11-02 LAB — PHOSPHORUS: Phosphorus: 5.6 mg/dL — ABNORMAL HIGH (ref 2.5–4.6)

## 2020-11-02 LAB — APTT: aPTT: 45 seconds — ABNORMAL HIGH (ref 24–36)

## 2020-11-02 LAB — MAGNESIUM: Magnesium: 2.3 mg/dL (ref 1.7–2.4)

## 2020-11-02 MED ORDER — SODIUM CHLORIDE 0.9 % FOR CRRT: INTRAVENOUS_CENTRAL | Status: DC | PRN

## 2020-11-02 MED ORDER — PRISMASOL BGK 4/2.5 32-4-2.5 MEQ/L REPLACEMENT SOLN
Status: DC
  Filled 2020-11-02 (×8): qty 5000

## 2020-11-02 MED ORDER — PRISMASOL BGK 4/2.5 32-4-2.5 MEQ/L REPLACEMENT SOLN
Status: DC
  Filled 2020-11-02 (×5): qty 5000

## 2020-11-02 MED ORDER — PRISMASOL BGK 4/2.5 32-4-2.5 MEQ/L EC SOLN
Status: DC
  Filled 2020-11-02 (×24): qty 5000

## 2020-11-02 MED ORDER — PIPERACILLIN-TAZOBACTAM 3.375 G IVPB 30 MIN
3.3750 g | Freq: Four times a day (QID) | INTRAVENOUS | Status: DC
  Administered 2020-11-02 – 2020-11-04 (×7): 3.375 g via INTRAVENOUS
  Filled 2020-11-02 (×14): qty 50

## 2020-11-02 MED ORDER — ZINC CHLORIDE 1 MG/ML IV SOLN
INTRAVENOUS | Status: AC
  Filled 2020-11-02: qty 804

## 2020-11-02 MED ORDER — HEPARIN SODIUM (PORCINE) 1000 UNIT/ML DIALYSIS
1000.0000 [IU] | INTRAMUSCULAR | Status: DC | PRN
  Administered 2020-11-02: 3100 [IU] via INTRAVENOUS_CENTRAL
  Filled 2020-11-02: qty 4
  Filled 2020-11-02: qty 1
  Filled 2020-11-02 (×2): qty 6

## 2020-11-02 MED ORDER — FLUCONAZOLE IN SODIUM CHLORIDE 400-0.9 MG/200ML-% IV SOLN
800.0000 mg | INTRAVENOUS | Status: DC
  Administered 2020-11-03: 800 mg via INTRAVENOUS
  Filled 2020-11-02 (×2): qty 400

## 2020-11-02 MED ORDER — "THROMBI-PAD 3""X3"" EX PADS"
1.0000 | MEDICATED_PAD | Freq: Once | CUTANEOUS | Status: AC
  Administered 2020-11-02: 1 via TOPICAL
  Filled 2020-11-02: qty 1

## 2020-11-02 MED ORDER — ATROPINE SULFATE 1 MG/10ML IJ SOSY
PREFILLED_SYRINGE | INTRAMUSCULAR | Status: AC
  Administered 2020-11-03: 1 mg
  Filled 2020-11-02: qty 10

## 2020-11-02 NOTE — Progress Notes (Signed)
eLink Physician-Brief Progress Note Patient Name: Mark May DOB: Jan 29, 1956 MRN: [id number]   Date of Service  11/02/2020  HPI/Events of Note  Patient oozing at the site of dialysis catheter.  eICU Interventions  Bedside RN instructed to apply thrombin pad + gauze + pressure dressing to the site, stat PT-INR, PTT, CBC with diff ordered.        Thomasene Lot Mark May 11/02/2020, 10:34 PM

## 2020-11-02 NOTE — Progress Notes (Signed)
Bradycardia occurring more frequently since the start of my shift. Day shift RN noted bradycardia right after starting CRRT but not as frequent. E-link called and made aware. Waiting for orders.

## 2020-11-02 NOTE — Progress Notes (Signed)
NAME:  Mark May, MRN:  [id number], DOB:  23-May-1956, LOS: 28 ADMISSION DATE:  11-Oct-2020, CONSULTATION DATE: October 11, 2020 REFERRING MD: Dr. Curtis Sites, CHIEF COMPLAINT: Left-sided weakness  Brief History   64 year old male with HTN and HLD who presented with left-sided weakness, noted to have right MCA occlusion status post thrombectomy and stent placement 11/5.  Hospitalization complicated by hypoxic respiratory failure secondary to pulmonary edema requiring BiPAP and lasix.  Awaiting CIR placement however, on the evening of 11/10, he was noted to become more lethargic with tachycardia and tachypnea.  Had complained of abdominal pain with some distention and chest pain which resolved after burping.  KUB obtained which was normal.  On 11/11, he was somewhat more responsive but now with increasing sCr in which lisinopril was stopped and development of fever 102.6  TRH initially consulted for help with medical management however on their evaluation, PCCM consulted given ill appearance and developing hypotension and new onset Afib with RVR found to have sepsis with RLL pneumonia likely due to aspiration started on zosyn. PCCM consulted for continue right abdominal pain and distension with concern for bowel perforation secondary to Cortrak.  On 11/13, CT abd/ pelvis showed large right perinephric fluid collection and pockets of air concerning for an infectious process/abscess. Underwent CT guided drain placement of right retroperitoneal abscess.  On 11/15, he developed melena with anemia.  GI consulted with plans to medically manage and deferred EGD.  Surgery was consulted on 11/17 with imaging concerning for possible contained bowel perforation.  On 11/20, he developed respiratory distress requiring intubation. Underwent thoracentesis on 11/20.  Hypotensive with Hgb drob on 11/21, CT ABD/Pelvis w/ concern for active RP bleed.  Subsequently, developed hemothorax requiring OR for VATS on 11/22. Developed  progressive hypotension 11/29 and suffered cardiac arrest in setting of RP bleed requiring intubation.   Past Medical History  Hypertension Peptic ulcer disease, previous partial gastrectomy (in [place] / 1990) Gout BPH Sciatica   Significant Hospital Events   11/05 Mechanical thrombectomy of right MCA and stent 11/5 11/11 PCCM reconsulted  11/12 Cortrak placed 11/14 CT guided drainage for R RP abscess with JP 11/17 PCCM reconsulted 11/18 CCM recalled by rapid response, fever, resp distress, CXR new left infiltrates with left pleural effusion 11/20 Thoracentesis  11/21 Hypotensive with Hgb drop to 6.1 s/p 2 units PRBC, CT ABD with concerning for active bleeding 11/22 To OR for VATS, decortication  11/29 Hypotension, cardiac arrest x20 min, VDRF, profound acidosis due to new retroperitoneal bleed, severe anemia 11/29 EGD >> blood in all areas of the gastric remnant with no clearly defined source of bleeding  Consults:  IR Neurology PCCM 11/5-11/8; 11/11; 11/16-11/17, 11/18  [facility] Urology 11/13 GI 11/15 CCS 11/17  Procedures:  R Femoral Sheath 11/6 >> 11/6 ETT 11/20 >> 11/26 RUE PICC 11/17 >>  ETT 11/29 >>   Significant Diagnostic Tests:  11/5 CT head >> No acute abnormality.  11/5 CTA head / neck >> Severe distal right M1 stenosis or subocclusive thrombus, Right MCA infarct with extensive penumbra, 4 mm right supraclinoid ICA aneurysm. Widely patent cervical carotid and vertebral arteries. 11/5 Postprocedure CT head >> Hypodensity in the right lateral temporal lobe now shows mild hemorrhage or contrast enhancement. This is most consistent with acute infarct. There has been interval stenting of the right middle cerebral artery. 11/6 MRI brain >> Fairly extensive patchy acute/early subacute infarcts within the right MCA/watershed territory.  Additional small acute/early subacute infarcts within the dorsal right thalamus and  left basal ganglia. 11/6  MRA head >>  Interval stenting  of the M1/M2 right middle cerebral artery. Flow related signal is present proximal and distal to the stent suggestive of stent patency. 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 11/6 TTE >> LVEF 70-75%, no RWMA, Grade II diastolic dysfunction, elevated LA pressure 11/13 CT ABD/Pelvis >> large right perinephric fluid collection and pockets of air concerning for an infectious process/abscess. Cholelithiasis. Nonobstructing right renal calculi. No hydronephrosis. Colonic diverticulosis. No bowel obstruction. Partially visualized small bilateral pleural effusions with complete consolidative changes of the visualized lower lobes. 11/16 CT ABD/Pelvis >> interval placement of drainage catheter into the right perinephric space in the area of previously seen gas and fluid collection. Fluid collection has decreased since prior study. There is now contrast material seen within the perinephric space and extending into the right paracolic gutter. This is concerning for possible fistulous communication to the colon. May consider contrast injection through the drainage catheter under fluoroscopic guidance to assess for enteric fistula. Moderate bilateral pleural effusions with bibasilar atelectasis or consolidation, unchanged.  Cholelithiasis.  NG tube in the stomach.  Aortic atherosclerosis.  Small to moderate free fluid in the pelvis. 11/20 L Thoracentesis >> 650 ml cloudy yellow fluid 11/21 CT abd/ pelvis >> very large area of intra-abdominal fluid and blood within the left upper quadrant, with an appearance worrisome for active bleeding.  Stable position of the right-sided percutaneous drainage catheter with a moderate amount of surrounding contained free air, fluid and inflammatory fat stranding. Fistulous communication with the adjacent portion of large bowel cannot be excluded. Moderate to marked amount of para muscular subcutaneous inflammatory fat stranding along the lateral  aspects of the left abdominal and pelvic walls. 11/30 CT abdomen pelvis chest >> RLQ percutaneous drainage catheter in place, decreased size abscess cavity. This is adjacent to the distal second portion of the duodenum but no evidence of extravasation of contrast.  Right retroperitoneal hematoma along the course of the drainage catheter 4.9 x 19.9 x 5.4 cm (right psoas, iliacus).  Left greater than right pleural effusions with a left-sided chest tube in place, left extrapleural collection.  Micro Data:  MRSA PCR 11/5 >> negative COVID 11/5 >> negative Flu 11/5 >> negative UC 11/10 >> neg BCx2 11/11 >> neg BC x2 11/13 >> neg Right RRP abscess 11/14 >> moderate candida, klebsiella >> R-ampicillin, S-cefazolin  BCx 2 11/19 >> negative  Left pleural fluid 11/20 >> Klebsiella >> R-ampicillin, I-unasyn, S-cefazolin, ceftriaxone, gent, imipenem  Antimicrobials:  Cefazolin 11/5 >> 11/6 Flagyl 11/11 x1 Cefepime 11/11, 11/18 >> 11/19  Vanco 11/12 >> 11/16, 11/18  Unasyn 11/17 >> 11/18  Flagyl 11/18 >> 11/19  Diflucan 11/17 >>  Zosyn 11/11 >> 11/17, 11/19 >>   Interim history/subjective:   On norepinephrine 8 (improved).  Bradycardic events reported 12/1 Hemoglobin overall stable 9.2 >> 8.8 >> 8.2 Not on any sedation  Objective   Blood pressure (!) 97/57, pulse 76, temperature 98.5 F (36.9 C), temperature source Oral, resp. rate (!) 30, weight 70.7 kg, SpO2 94 %. CVP:  [5 mmHg-7 mmHg] 5 mmHg  Vent Mode: PRVC FiO2 (%):  [30 %-50 %] 30 % Set Rate:  [30 bmp] 30 bmp Vt Set:  [530 mL] 530 mL PEEP:  [5 cmH20] 5 cmH20 Plateau Pressure:  [22 cmH20-24 cmH20] 23 cmH20   Intake/Output Summary (Last 24 hours) at 11/02/2020 1024 Last data filed at 11/02/2020 1000 Gross per 24 hour  Intake 3458.63 ml  Output 0 ml  Net 3458.63 ml   Filed Weights   10/31/20 0440 11/01/20 0438 11/02/20 0420  Weight: 62.4 kg 66.8 kg 70.7 kg    Physical Exam General: Thin chronically ill man,  ventilated HEENT: Oropharynx clear, ET tube in place, some temporal wasting Neuro: Did not respond to pain, does not open eyes or track.  No sedation running CV: Regular, distant, no murmur PULM: Ventilated breath sounds, no wheezing or crackles.   GI: Abdomen somewhat firm, right-sided drain in place unchanged, hypoactive bowel sounds Extremities: Trace generalized edema Skin: No rash    Resolved Hospital Problems:  Hypernatremia  Assessment & Plan:    Acute respiratory failure secondary to aspiration pneumonia with bilateral pleural effusion, left klebsiella empyema, ? Connection with abdominal process Hemothorax following thoracentesis, s/p VATS 11/22. No active bleeding after clot removal. Recurrent acute respiratory failure in setting cardiac arrest, acidosis, retroperitoneal bleed S/p thora on 11/20 with exudative properties, klebsiella in pleural culture & RP abscess.  s/post thora extravasation on CTA chest. Cardiac arrest 11/29, reintubated in setting of RP bleed. -Continue PRVC, increased minute ventilation on 12/1 to manage acidosis -Stable PEEP, FiO2 needs - minimal settings -Appreciate T CTS assistance, plan for chest tube removal today 12/2 -Follow intermittent chest x-ray including 12/3 - bilateral effusions -Try to progress to spontaneous breathing trials if mental status, overall hemodynamic and metabolic status will allow.  Poor candidate currently  Mixed Shock, Septic + ABLA / Hypovolemic, RLL PNA, Intra-Abdominal Abscess.   Recurrent on 11/29 due to acute increased retroperitoneal bleeding and also upper GI bleeding.  Resulting cardiac arrest, CPR 20 minutes.  Initial shock multifactorial, ABLA and septic. S/p 2 units blood on 11/21.  Klebsiella Empyema + fluid collection in abd. Recurrent episode 11/29 appears to be more likely due to acute blood loss Suspected aspiration and intraabdominal abscess (klebsiella and candida) in setting of bowel perforation, left  empyema.  No clear evidence for connection between the intra-abdominal process and pleural space -Continue diflucan (started 11/17), zosyn (started 11/19), plan for minimum 14 days therapy, but actual duration difficult to determine given perforation as source -Continue efforts to wean pressors, goal MAP 65 -PPI BID  Intra-abdominal/retroperitoneal abscess with Contained Bowel Perforation.   Hx of PUD, Partial Gastrectomy No clear persistent connection between duodenum and abscess based on CT 11/29 -Appreciate CCS, IR assistance.  No role for surgery at this point -Antibiotics, antifungals as above  Retroperitoneal Bleed / Hematoma  4.9x19.9x5.4 cm, appears source of bleeding related to drain and RP collection, not from gastric or duodenal source -Anticoagulation is on hold -Follow with serial CBC -Follow drainage from retroperitoneal tube, gastric tube -Appreciate gastroenterology assistance  Acute encephalopathy. Currently off sedation for over 48 hours 12/2. Consider toxic metabolic especially given progressive renal failure. Certainly concerning for possible ischemic, hypoxic brain injury given CPR 12/29. -Consider MRI brain, EEG if no improvement on CRRT  AKI Progressive following arrest 11/29 -Temporizing with bicarb infusion -Appreciate nephrology evaluation and assistance.  No clear urgent indication for HD on 12/3  -Time limited trial of CRRT after multidisciplinary and family discussion 12/3 -Follow BMP, urine output -Renal dose medications  Hypocalcemia  -Follow, replace if indicated  PAF with RVR Currently rate controlled. Heparin infusion stopped 11/29 with acute RP bleed. 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  TPN Pain/Anxiety/Delirium protocol (if indicated): Intermittent Versed, fentanyl VAP protocol (if indicated): Oral care protocol DVT prophylaxis: SCDs for now GI prophylaxis: PPI BID Glucose control: SSI Mobility: bed rest Code Status: full code Family Communication: Updated son and daughter at bedside 12/3 Disposition:  ICU  LABS    PULMONARY Recent Labs  Lab 10/19/2020 1737 10/02/2020 2055 10/31/20 1211  PHART 7.198* 7.332* 7.539*  PCO2ART 43.7 25.8* 24.7*  PO2ART 400* 170* 70*  HCO3 17.0* 13.4* 21.1  TCO2 18*  --  22  O2SAT 100.0 98.8 96.0   CBC Recent Labs  Lab 10/31/20 1426 11/01/20 0423 11/02/20 0737  HGB 8.8* 8.2* 8.2*  HCT 26.5* 24.2* 23.6*  WBC 14.6* 17.7* 14.8*  PLT 141* 107* 81*   COAGULATION Recent Labs  Lab 10/02/2020 2100  INR 2.7*   CARDIAC  No results for input(s): TROPONINI in the last 168 hours. No results for input(s): PROBNP in the last 168 hours.  CHEMISTRY Recent Labs  Lab 10/27/20 0443 10/27/20 0620 10/03/2020 0529 10/12/2020 0529 10/30/2020 1628 10/23/2020 1737 10/30/20 0339 10/30/20 0948 10/30/20 1950 10/30/20 1950 10/31/20 0231 10/31/20 0231 10/31/20 1211 10/31/20 1211 10/31/20 1426 10/31/20 1426 11/01/20 0800 11/02/20 0421  NA 143   < > 147*   < > 147*   < > 141   < > 144   < > 145  --  140  --  145  --  142 139  K 3.8   < > 4.0   < > 7.1*   < > 4.7   < > 5.0   < > 4.4   < > 4.3   < > 4.3   < > 4.0 3.6  CL 104   < > 111   < > 114*   < > 99   < > 94*  --  95*  --   --   --  95*  --  93* 90*  CO2 26   < > 24   < > 12*   < > 20*   < > 24  --  23  --   --   --  19*  --  18* 19*  GLUCOSE 433*   < > 175*   < > 236*   < > 93   < > 78  --  74  --   --   --  75  --  123* 130*  BUN 66*   < > 56*   < > 60*   < > 68*   < > 87*  --  91*  --   --   --  95*  --  105* 123*  CREATININE 1.83*   <  > 1.46*   < > 1.94*   < > 2.71*   < > 3.57*  --  3.94*  --   --   --  4.38*  --  5.03* 5.51*  CALCIUM 7.7*   < > 7.8*   < > 8.2*   < > 6.5*   < > 6.2*  --  6.4*  --   --   --  6.3*  --  6.5* 7.1*  MG  --    < > 2.5*   < > 3.4*  --  1.9  --   --   --  2.6*  --   --   --   --   --  2.4 2.3  PHOS 3.0  --  2.9  --   --   --   --   --   --   --  5.9*  --   --   --   --   --  6.7* 5.6*   < > = values in this interval not displayed.   Estimated Creatinine Clearance: 12.5 mL/min (A) (by C-G formula based on SCr of 5.51 mg/dL (H)).  LIVER Recent Labs  Lab 10/18/2020 0529 10/25/2020 2100 10/31/20 0231 11/01/20 0800  AST 89*  --  2,275* 3,711*  ALT 124*  --  1,447* 3,092*  ALKPHOS 206*  --  185* 208*  BILITOT 1.3*  --  5.8* 5.4*  PROT 5.9*  --  4.9* 4.2*  ALBUMIN 1.4*  --  1.6* 1.2*  INR  --  2.7*  --   --    INFECTIOUS Recent Labs  Lab 10/30/20 0948 10/31/20 0237 10/31/20 1158  LATICACIDVEN >11.0* 8.2* 10.8*   ENDOCRINE CBG (last 3)  Recent Labs    11/01/20 2341 11/02/20 0327 11/02/20 0736  GLUCAP 135* 127* 131*    CRITICAL CARE Performed by: Lesia Sago Osie Merkin   Total critical care time: 40 minutes  Critical care time was exclusive of separately billable procedures and treating other patients.  Critical care was necessary to treat or prevent imminent or life-threatening deterioration.  Critical care was time spent personally by me on the following activities: development of treatment plan with patient and/or surrogate as well as nursing, discussions with consultants, evaluation of patient's response to treatment, examination of patient, obtaining history from patient or surrogate, ordering and performing treatments and interventions, ordering and review of laboratory studies, ordering and review of radiographic studies, pulse oximetry and re-evaluation of patient's condition.

## 2020-11-02 NOTE — Procedures (Signed)
Central Venous Catheter Insertion Procedure Note  Bryceson Grape  [id number]  1956/06/22  Date:11/02/20  Time:3:13 PM   Provider Performing:Emalynn Clewis R Karsyn Rochin   Procedure: Insertion of Non-tunneled Central Venous Catheter(36556)with US guidance (54270)    Indication(s) Hemodialysis  Consent Risks of the procedure as well as the alternatives and risks of each were explained to the patient and/or caregiver.  Consent for the procedure was obtained and is signed in the bedside chart  Anesthesia Topical only with 1% lidocaine   Timeout Verified patient identification, verified procedure, site/side was marked, verified correct patient position, special equipment/implants available, medications/allergies/relevant history reviewed, required imaging and test results available.  Sterile Technique Maximal sterile technique including full sterile barrier drape, hand hygiene, sterile gown, sterile gloves, mask, hair covering, sterile ultrasound probe cover (if used).  Procedure Description Area of catheter insertion was cleaned with chlorhexidine and draped in sterile fashion.   With real-time ultrasound guidance a HD catheter was placed into the right internal jugular vein. The guidewire was clearly visible in lumen of IJ under [place]. Nonpulsatile blood flow and easy flushing noted in all ports.  The catheter was sutured in place and sterile dressing applied.  Complications/Tolerance None; patient tolerated the procedure well. Chest X-ray is ordered to verify placement for internal jugular or subclavian cannulation.  Chest x-ray is not ordered for femoral cannulation.  EBL Minimal  Specimen(s) None

## 2020-11-02 NOTE — Progress Notes (Signed)
Patient ID: Mark May, male   DOB: [birth year], 64 y.o.   MRN: [id number] S: no events overnight.  Not responding off of sedation O:BP (!) 111/58   Pulse 85   Temp 98.1 F (36.7 C) (Oral)   Resp (!) 31   Wt 70.7 kg   SpO2 93%   BMI 24.41 kg/m   Intake/Output Summary (Last 24 hours) at 11/02/2020 1000 Last data filed at 11/02/2020 0900 Gross per 24 hour  Intake 3330.9 ml  Output 0 ml  Net 3330.9 ml   Intake/Output: I/O last 3 completed shifts: In: 5447.8 [I.V.:5184.6; IV Piggyback:263.2] Out: 41 [Urine:15; Chest Tube:26]  Intake/Output this shift:  Total I/O In: 333.5 [I.V.:333.5] Out: 0  Weight change: 3.9 kg Gen: intubated, chronically ill-appearing, unresponsive CVS: RRR Resp: ventilated BS  WYO:VZCHYIFOY, hypoactive BS Ext: 1+ anasarca  Recent Labs  Lab 10/27/20 0443 10/27/20 0620 10/13/2020 0529 10/12/2020 1628 10/30/20 0339 10/30/20 0339 10/30/20 0948 10/30/20 1950 10/31/20 0231 10/31/20 1211 10/31/20 1426 11/01/20 0800 11/02/20 0421  NA 143   < > 147*   < > 141   < > 143 144 145 140 145 142 139  K 3.8   < > 4.0   < > 4.7   < > 4.7 5.0 4.4 4.3 4.3 4.0 3.6  CL 104   < > 111   < > 99  --  96* 94* 95*  --  95* 93* 90*  CO2 26   < > 24   < > 20*  --  21* 24 23  --  19* 18* 19*  GLUCOSE 433*   < > 175*   < > 93  --  91 78 74  --  75 123* 130*  BUN 66*   < > 56*   < > 68*  --  75* 87* 91*  --  95* 105* 123*  CREATININE 1.83*   < > 1.46*   < > 2.71*  --  3.04* 3.57* 3.94*  --  4.38* 5.03* 5.51*  ALBUMIN  --   --  1.4*  --   --   --   --   --  1.6*  --   --  1.2*  --   CALCIUM 7.7*   < > 7.8*   < > 6.5*  --  6.5* 6.2* 6.4*  --  6.3* 6.5* 7.1*  PHOS 3.0  --  2.9  --   --   --   --   --  5.9*  --   --  6.7* 5.6*  AST  --   --  89*  --   --   --   --   --  2,275*  --   --  3,711*  --   ALT  --   --  124*  --   --   --   --   --  1,447*  --   --  3,092*  --    < > = values in this interval not displayed.   Liver Function Tests: Recent Labs  Lab 10/15/2020 0529  10/31/20 0231 11/01/20 0800  AST 89* 2,275* 3,711*  ALT 124* 1,447* 3,092*  ALKPHOS 206* 185* 208*  BILITOT 1.3* 5.8* 5.4*  PROT 5.9* 4.9* 4.2*  ALBUMIN 1.4* 1.6* 1.2*   No results for input(s): LIPASE, AMYLASE in the last 168 hours. No results for input(s): AMMONIA in the last 168 hours. CBC: Recent Labs  Lab 10/27/20 0620 10/28/20 0444 10/28/20 1537  10/28/20 1537 10/13/2020 0529 10/06/2020 1735 10/30/20 1912 10/30/20 1912 10/31/20 0214 10/31/20 1211 10/31/20 1426 11/01/20 0423 11/02/20 0737  WBC 11.2*   < > 9.6   < > 12.5*   < > 23.6*   < > 19.8*  --  14.6* 17.7* 14.8*  NEUTROABS 9.4*  --  7.6  --  10.0*  --   --   --   --   --   --   --   --   HGB 8.7*   < > 8.0*   < > 8.2*   < > 9.2*   < > 9.2*   < > 8.8* 8.2* 8.2*  HCT 26.7*   < > 24.7*   < > 26.0*   < > 26.8*   < > 26.3*   < > 26.5* 24.2* 23.6*  MCV 85.6   < > 86.7   < > 87.5   < > 83.2  --  82.4  --  86.9 88.3 86.1  PLT 229   < > 268   < > 303   < > 148*   < > 147*  --  141* 107* 81*   < > = values in this interval not displayed.   Cardiac Enzymes: No results for input(s): CKTOTAL, CKMB, CKMBINDEX, TROPONINI in the last 168 hours. CBG: Recent Labs  Lab 11/01/20 1542 11/01/20 1955 11/01/20 2341 11/02/20 0327 11/02/20 0736  GLUCAP 122* 118* 135* 127* 131*    Iron Studies: No results for input(s): IRON, TIBC, TRANSFERRIN, FERRITIN in the last 72 hours. Studies/Results: DG Chest Port 1 View  Result Date: 11/02/2020 CLINICAL DATA:  Respiratory failure. EXAM: PORTABLE CHEST 1 VIEW COMPARISON:  11/01/2020. FINDINGS: Interim removal of NG tube and left chest tube. Endotracheal tube and right PICC line in unchanged position. PICC line tip noted over the right atrium. Heart size stable. Persistent atelectasis/infiltrate left lung base and small left pleural effusion. Persistent left-sided pleural thickening. Progressive diffuse right lung infiltrate/edema. Persistent right pleural effusion. No pneumothorax. IMPRESSION:  1. Interim removal of NG tube and left chest tube. No pneumothorax. 2. Persistent atelectasis/infiltrate left lung base and small left pleural effusion. Persistent left-sided pleural thickening. 3. Progressive diffuse right lung infiltrate/edema. Persistent right pleural effusion. Electronically Signed   By: Marcello Moores  Register   On: 11/02/2020 07:36   DG CHEST PORT 1 VIEW  Result Date: 11/01/2020 CLINICAL DATA:  Pleural effusion. EXAM: PORTABLE CHEST 1 VIEW COMPARISON:  Chest x-ray 10/31/2020.  Chest CT 10/30/2020. FINDINGS: Endotracheal tube, NG tube, right PICC line in stable position. Left chest tube in stable position. Heart size stable. Persistent atelectasis/infiltrate left lung base. Persistent small left pleural effusion. New layering right pleural effusion. Underlying right base atelectasis/infiltrate cannot be excluded. No pneumothorax. IMPRESSION: 1. Lines and tubes including left chest tube in stable position. No pneumothorax. 2. Persistent atelectasis/infiltrate left lung base. Persistent small left pleural effusion. 3. New layering right pleural effusion. Underlying right base atelectasis/infiltrate cannot be excluded. Electronically Signed   By: Marcello Moores  Register   On: 11/01/2020 06:10   [place] EKG SITE RITE  Result Date: 10/31/2020 If Site Rite image not attached, placement could not be confirmed due to current cardiac rhythm.  .  stroke: mapping our early stages of recovery book   Does not apply Once  . chlorhexidine gluconate (MEDLINE KIT)  15 mL Mouth Rinse BID  . Chlorhexidine Gluconate Cloth  6 each Topical Daily  . insulin aspart  3-9 Units Subcutaneous Q4H  .  mouth rinse  15 mL Mouth Rinse 10 times per day  . pantoprazole  40 mg Intravenous Q12H  . sodium chloride flush  10-40 mL Intracatheter Q12H  . sodium chloride flush  10-40 mL Intracatheter Q12H  . sodium chloride flush  5 mL Intracatheter Q8H    BMET    Component Value Date/Time   NA 139 11/02/2020 0421   NA 140  03/09/2018 1620   NA 136 01/06/2015 1448   K 3.6 11/02/2020 0421   K 3.6 01/06/2015 1448   CL 90 (L) 11/02/2020 0421   CL 99 01/06/2015 1448   CO2 19 (L) 11/02/2020 0421   CO2 28 01/06/2015 1448   GLUCOSE 130 (H) 11/02/2020 0421   GLUCOSE 154 (H) 01/06/2015 1448   BUN 123 (H) 11/02/2020 0421   BUN 12 03/09/2018 1620   BUN 12 01/06/2015 1448   CREATININE 5.51 (H) 11/02/2020 0421   CREATININE 0.88 01/06/2015 1448   CALCIUM 7.1 (L) 11/02/2020 0421   CALCIUM 8.7 01/06/2015 1448   GFRNONAA 11 (L) 11/02/2020 0421   GFRNONAA >60 01/06/2015 1448   GFRAA 113 03/09/2018 1620   GFRAA >60 01/06/2015 1448   CBC    Component Value Date/Time   WBC 14.8 (H) 11/02/2020 0737   RBC 2.74 (L) 11/02/2020 0737   HGB 8.2 (L) 11/02/2020 0737   HGB 15.9 03/09/2018 1620   HCT 23.6 (L) 11/02/2020 0737   HCT 46.9 03/09/2018 1620   PLT 81 (L) 11/02/2020 0737   PLT 331 03/09/2018 1620   MCV 86.1 11/02/2020 0737   MCV 90 03/09/2018 1620   MCV 80 01/06/2015 1448   MCH 29.9 11/02/2020 0737   MCHC 34.7 11/02/2020 0737   RDW 17.2 (H) 11/02/2020 0737   RDW 14.7 03/09/2018 1620   RDW 17.1 (H) 01/06/2015 1448   LYMPHSABS 1.3 10/06/2020 0529   LYMPHSABS 1.9 03/09/2018 1620   LYMPHSABS 0.9 (L) 01/06/2015 1448   MONOABS 0.7 10/27/2020 0529   MONOABS 0.5 01/06/2015 1448   EOSABS 0.2 10/17/2020 0529   EOSABS 0.1 03/09/2018 1620   EOSABS 0.0 01/06/2015 1448   BASOSABS 0.0 10/21/2020 0529   BASOSABS 0.0 03/09/2018 1620   BASOSABS 0.1 01/06/2015 1448    Assessment/Plan: 1. AKI, oliguric- presumably due to ischemic ATN in setting of shock and ABLA. Renal dose meds. No emergent indication for dialysis, however if he remains anuric and worsening electrolytes will likely initiate RRT in the next 24 hours.  1. Check bladder scans 2. No evidence of obstruction on CT of abd/pelvis on 10/21/20 but may need to repeat. 3. Remains anuric and discussed with PCCM staff and patient's son that if they wish to  continue with aggressive measures he will likely need to start CRRT in the next 24 hours. 4. Discussed with the son that this would be on a short term basis only.  Plan to start today and re-evaluate early next week and if he was not improving clinically we would stop due to medical futility. 2. Metabolic acidosis- due to #1. Currently on bicarb drip but CO2 dropping. 3. Acute respiratory failure due to aspiration pneumonia- on vent per PCCM 4. Shock- multifactorial with infection and ABLA as well as A fib and RVR. Currently on levophed drip. Wean per PCCM 5. Klebsiella infection- from pleural culture and retroperitoneal abscess. Currently on Zosyn. Need to adjust dose due to AKI and GFR <20 6. ABLA from GI bleed- cont to transfuse as needed 7. Duodenal perforation- on abx. GI and  surgery following. 8. PAF with RVR- currently rate controlled with amiodarone. No heparin due to GIB.  9. Left hemothorax s/p VATS. 10. Severe protein malnutrition- per PCCM 11. 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 12. Disposition- poor overall prognosis. Continue with aggressive measures for now but may benefit from Palliative care consult to help set goals/limits of care.   Donetta Potts, MD [employer] [phone]

## 2020-11-02 NOTE — Progress Notes (Signed)
eLink Physician-Brief Progress Note Patient Name: Mark May DOB: [birth year] MRN: [id number]   Date of Service  11/02/2020  HPI/Events of Note  Afib with VRR between 50-71, BP 96/67, MAP 77.  eICU Interventions  Will monitor Heart rate for now, no intervention as long as HR between 50-100 and MAP > 65 mmHg.        [name] 11/02/2020, 7:54 PM

## 2020-11-02 NOTE — Progress Notes (Addendum)
4 Days Post-Op   Subjective/Chief Complaint: unchanged   Objective: Vital signs in last 24 hours: Temp:  [97.6 F (36.4 C)-98.5 F (36.9 C)] 98.5 F (36.9 C) (12/03 0800) Pulse Rate:  [74-94] 83 (12/03 1045) Resp:  [10-37] 30 (12/03 1045) BP: (68-150)/(46-79) 96/60 (12/03 1045) SpO2:  [88 %-98 %] 93 % (12/03 1045) FiO2 (%):  [30 %-50 %] 30 % (12/03 0800) Weight:  [70.7 kg] 70.7 kg (12/03 0420) Last BM Date: 11/02/20  Intake/Output from previous day: 12/02 0701 - 12/03 0700 In: 3463.2 [I.V.:3299.9; IV Piggyback:163.2] Out: 26 [Chest Tube:26] Intake/Output this shift: Total I/O In: 471.2 [I.V.:461.2; Other:10] Out: 0   Gen: on vent, critically ill Abd: soft, drain with old blood, nondistended  Lab Results:  Recent Labs    11/01/20 0423 11/02/20 0737  WBC 17.7* 14.8*  HGB 8.2* 8.2*  HCT 24.2* 23.6*  PLT 107* 81*   BMET Recent Labs    11/01/20 0800 11/02/20 0421  NA 142 139  K 4.0 3.6  CL 93* 90*  CO2 18* 19*  GLUCOSE 123* 130*  BUN 105* 123*  CREATININE 5.03* 5.51*  CALCIUM 6.5* 7.1*   PT/INR No results for input(s): LABPROT, INR in the last 72 hours. ABG Recent Labs    10/31/20 1211  PHART 7.539*  HCO3 21.1    Studies/Results: DG Chest Port 1 View  Result Date: 11/02/2020 CLINICAL DATA:  Respiratory failure. EXAM: PORTABLE CHEST 1 VIEW COMPARISON:  11/01/2020. FINDINGS: Interim removal of NG tube and left chest tube. Endotracheal tube and right PICC line in unchanged position. PICC line tip noted over the right atrium. Heart size stable. Persistent atelectasis/infiltrate left lung base and small left pleural effusion. Persistent left-sided pleural thickening. Progressive diffuse right lung infiltrate/edema. Persistent right pleural effusion. No pneumothorax. IMPRESSION: 1. Interim removal of NG tube and left chest tube. No pneumothorax. 2. Persistent atelectasis/infiltrate left lung base and small left pleural effusion. Persistent left-sided pleural  thickening. 3. Progressive diffuse right lung infiltrate/edema. Persistent right pleural effusion. Electronically Signed   By: Maisie Fus  Register   On: 11/02/2020 07:36   DG CHEST PORT 1 VIEW  Result Date: 11/01/2020 CLINICAL DATA:  Pleural effusion. EXAM: PORTABLE CHEST 1 VIEW COMPARISON:  Chest x-ray 10/31/2020.  Chest CT 10/30/2020. FINDINGS: Endotracheal tube, NG tube, right PICC line in stable position. Left chest tube in stable position. Heart size stable. Persistent atelectasis/infiltrate left lung base. Persistent small left pleural effusion. New layering right pleural effusion. Underlying right base atelectasis/infiltrate cannot be excluded. No pneumothorax. IMPRESSION: 1. Lines and tubes including left chest tube in stable position. No pneumothorax. 2. Persistent atelectasis/infiltrate left lung base. Persistent small left pleural effusion. 3. New layering right pleural effusion. Underlying right base atelectasis/infiltrate cannot be excluded. Electronically Signed   By: Maisie Fus  Register   On: 11/01/2020 06:10   [place] EKG SITE RITE  Result Date: 10/31/2020 If Site Rite image not attached, placement could not be confirmed due to current cardiac rhythm.   Anti-infectives: Anti-infectives (From admission, onward)   Start     Dose/Rate Route Frequency Ordered Stop   10/31/20 1400  piperacillin-tazobactam (ZOSYN) IVPB 2.25 g        2.25 g 100 mL/hr over 30 Minutes Intravenous Every 8 hours 10/31/20 0737     10/27/2020 1400  fluconazole (DIFLUCAN) IVPB 200 mg        200 mg 100 mL/hr over 60 Minutes Intravenous Every 24 hours 10/08/2020 1148     10/19/20 1400  piperacillin-tazobactam (ZOSYN)  IVPB 3.375 g  Status:  Discontinued        3.375 g 12.5 mL/hr over 240 Minutes Intravenous Every 8 hours 10/19/20 1150 10/31/20 0737   10/19/20 0200  vancomycin (VANCOREADY) IVPB 500 mg/100 mL  Status:  Discontinued        500 mg 100 mL/hr over 60 Minutes Intravenous Every 12 hours 10/19/20 0025 10/19/20 0951    10/19/20 0200  metroNIDAZOLE (FLAGYL) IVPB 500 mg  Status:  Discontinued        500 mg 100 mL/hr over 60 Minutes Intravenous Every 8 hours 10/19/20 0045 10/19/20 1150   10/19/20 0115  ceFEPIme (MAXIPIME) 2 g in sodium chloride 0.9 % 100 mL IVPB  Status:  Discontinued        2 g 200 mL/hr over 30 Minutes Intravenous Every 12 hours 10/19/20 0025 10/19/20 1150   10/17/20 1400  Ampicillin-Sulbactam (UNASYN) 3 g in sodium chloride 0.9 % 100 mL IVPB  Status:  Discontinued        3 g 200 mL/hr over 30 Minutes Intravenous Every 6 hours 10/17/20 1207 10/18/20 2325   10/17/20 1400  fluconazole (DIFLUCAN) IVPB 400 mg  Status:  Discontinued        400 mg 100 mL/hr over 120 Minutes Intravenous Every 24 hours 10/17/20 1207 10/10/2020 1148   10/15/20 1630  vancomycin (VANCOREADY) IVPB 500 mg/100 mL  Status:  Discontinued        500 mg 100 mL/hr over 60 Minutes Intravenous Every 12 hours 10/15/20 0940 10/17/20 1205   10/14/20 0400  vancomycin (VANCOREADY) IVPB 500 mg/100 mL  Status:  Discontinued        500 mg 100 mL/hr over 60 Minutes Intravenous Every 24 hours 10/13/20 0233 10/15/20 0940   10/13/20 0330  vancomycin (VANCOREADY) IVPB 750 mg/150 mL        750 mg 150 mL/hr over 60 Minutes Intravenous  Once 10/13/20 0233 10/13/20 0439   10/12/20 1800  ceFEPIme (MAXIPIME) 2 g in sodium chloride 0.9 % 100 mL IVPB  Status:  Discontinued        2 g 200 mL/hr over 30 Minutes Intravenous Every 24 hours 10/11/20 1718 10/11/20 1742   10/12/20 1800  vancomycin (VANCOREADY) IVPB 750 mg/150 mL  Status:  Discontinued        750 mg 150 mL/hr over 60 Minutes Intravenous Every 24 hours 10/11/20 1724 10/11/20 1758   10/12/20 0200  metroNIDAZOLE (FLAGYL) tablet 500 mg  Status:  Discontinued        500 mg Oral Every 8 hours 10/11/20 1715 10/11/20 1758   10/12/20 0030  piperacillin-tazobactam (ZOSYN) IVPB 3.375 g  Status:  Discontinued        3.375 g 12.5 mL/hr over 240 Minutes Intravenous Every 8 hours 10/11/20 1805  10/17/20 1205   10/11/20 1830  piperacillin-tazobactam (ZOSYN) IVPB 3.375 g        3.375 g 100 mL/hr over 30 Minutes Intravenous  Once 10/11/20 1805 10/11/20 1839   10/11/20 1715  ceFEPIme (MAXIPIME) 2 g in sodium chloride 0.9 % 100 mL IVPB  Status:  Discontinued        2 g 200 mL/hr over 30 Minutes Intravenous STAT 10/11/20 1710 10/11/20 1742   10/11/20 1715  metroNIDAZOLE (FLAGYL) IVPB 500 mg  Status:  Discontinued        500 mg 100 mL/hr over 60 Minutes Intravenous STAT 10/11/20 1710 10/11/20 1807   10/11/20 1715  vancomycin (VANCOCIN) IVPB 1000 mg/200 mL premix  Status:  Discontinued        1,000 mg 200 mL/hr over 60 Minutes Intravenous STAT 10/11/20 1710 10/11/20 1804   10/31/2020 1541  ceFAZolin (ANCEF) 2-4 GM/100ML-% IVPB       Note to Pharmacy: [facility]   : cabinet override      10/31/20 1541 10/06/20 0344      Assessment/Plan: Cardiac arrest 11/29  Retroperitoneal bleeding/hematoma - 4.9 x 19.9 x 5.4 cm, continue to hold heparin,this appears to have stabilized, best I can tell the source of bleeding is related to the drain and rp collection and is not from a gastric or duodenal source. I think the blood in the stomach was coming from the perforation and the connection to the bleeding area in the rp.  Hx of PUD s/p partial gastrectomy 1990 in [place]  Contained bowel perforation with intra-abdominal abscess - s/p IR drain placement 11/14 - growing klebsiella, continue drain. -suspectthatthe second portion of theduodenum is most likely site of perforation based on CT. - s/p emergent EGD at bedside 11/29 by Dr. Bosie Clos due to blood encountered during NG placement and cardiac arrest. Blood was found in gastric remnant and one of the jejunal limbs but no source of bleeding was found. -NGT has not been to suction.  Aspirated with no return essentially.  Will DC NGT - recommendSTRICTNPO and TPN - no acute surgical needs. Hold heparin. -the perforation is contained,  there is no real surgery to fix the hemorrhage. There is a role for surgery at some point but this is not needed right now and would not be well tolerated for what this would require.   L hemothoraxafter thoracentesis- S/p VATS 11/22 Dr. Cliffton Asters, per TCTS, pleural culture also growing klebsiella.   FEN: NPO,NGT to LIWS,IVF, PICC and TPN  ZOX:WRUE, anticoagulation held in the setting of acute blood loss ID: current abx are zosyn and diflucan; RP cultures and pleural cultures both growing klebsiella, raising concern that the two collections are connected.  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

## 2020-11-02 NOTE — Plan of Care (Signed)
  Problem: Self-Care: Goal: Ability to participate in self-care as condition permits will improve Outcome: Not Progressing Goal: Verbalization of feelings and concerns over difficulty with self-care will improve Outcome: Not Progressing Goal: Ability to communicate needs accurately will improve Outcome: Not Progressing   Problem: Nutrition: Goal: Dietary intake will improve Outcome: Not Progressing   Problem: Fluid Volume: Goal: Hemodynamic stability will improve Outcome: Not Progressing   Problem: Respiratory: Goal: Ability to maintain adequate ventilation will improve Outcome: Not Progressing

## 2020-11-02 NOTE — Progress Notes (Signed)
PHARMACY NOTE:  ANTIMICROBIAL RENAL DOSAGE ADJUSTMENT  Current antimicrobial regimen includes a mismatch between antimicrobial dosage and estimated renal function.  As per policy approved by the Pharmacy & Therapeutics and Medical Executive Committees, the antimicrobial dosage will be adjusted accordingly.  Current antimicrobial dosage:  Zosyn 2.25g IV every 8 hours. Fluconazole 200mg  IV every 24 hours.   Indication: Intra-abdominal infection  Renal Function:  Estimated Creatinine Clearance: 12.7 mL/min (A) (by C-G formula based on SCr of 5.41 mg/dL (H)). []      On intermittent HD, scheduled: [x]      On CRRT    Antimicrobial dosage has been changed to:   Increase Zosyn to 3.375g IV every 6 hours while on CRRT.  Increase Fluconazole to 800 mg IV every 24 hours while on CRRT.   Additional comments:   Thank you for allowing pharmacy to be a part of this patient's care.  , PharmD, BCPS, BCCCP Clinical Pharmacist Please refer to [facility] for [facility] Pharmacy numbers 11/02/2020 5:17 PM

## 2020-11-02 NOTE — Progress Notes (Signed)
PHARMACY - TOTAL PARENTERAL NUTRITION CONSULT NOTE  Indication: Perforated duodenum  Patient Measurements: Weight: 70.7 kg (155 lb 13.8 oz)   Body mass index is 24.41 kg/m. Usual Weight: 120-125 lbs  Assessment:  64 YOM presented on 10/11/2020 with right MCA occlusion and underwent revascularization. PMH significant for PUD post partial gastrectomy in 1990. Patient was started on a dysphagia diet on 10/08/20 and also transferred out of the ICU. Post-op course complicated by Afib RVR, aspiration PNA/cellulitis and AKI. He was then started on TF on 10/12/20, which ended on 10/13/20 when CT showed perinephric abscess with gas formation. Drain was placed and there is concern for fistulous communication to the colon on 10/16/20 CT. Nutrition has been inadequate since admission, and Pharmacy consulted for TPN management. Per patient's son, patient has no recent weight loss and was eating a balanced diet PTA.  TPN stopped 11/29 afternoon s/p code d/t access issues, was to be resumed 11/30 however could not be started d/t access issues, triple lumen placed 12/1 and TPN restarted.    Glucose / Insulin: no hx DM - CBGs 100-130s with TPN re-start, 12 units insulin used s/p re-start Electrolytes: K 3.6 (goal >4 with Afib), Mag 2.3, Na 145>142>139, Phos 2.9>6.7>5.6, CO2 19 (Bicarb gtt @50 ) Renal: SCr 3.94>5.03>5.51 LFTs / TGs:  AST/ALT large uptrend and increasing to 3,711/3,092, Tbili up to 5.8>5.4, last TG wnl Prealbumin / albumin: albumin 1.1>1.2, prealbumin up 15.3c Intake / Output; MIVF: UOP down to 0 ml/kg/hr, drain output 26 mL, chest tube out  GI Imaging: 11/20 CT - large IA fluid and blood in LUQ worrisome for active bleeding, renal cysts, cannot exclude fistulous communication with large bowel  NGT to be placed for CT w/contrast 11/29 >> now with bleed (stomach, retroperitoneal), decreased abscess size with drain  Surgeries / Procedures: 11/20 - intubated, thoracentesis -- drained 12/20 cloudy,  yellow appearing fluid 11/22 - VATS/decortication 11/29: Re-intubated  Central access: PICC 10/17/20,TL placement 12/1 TPN start date: 10/17/20  Nutritional Goals (per RD rec on 11/23): 1950 - 2240 kCal, 115 - 130g protein; >1.8L fluid per day  Goal TPN rate is 75 ml/hr - will provide 120g protein, 270g CHO, and 61g SMOF lipids, for total 2012 kCal  Current Nutrition:  TPN; NPO  Plan:  Continue TPN at goal rate 75 mL/hr (Using Clinisol 15% d/t shortage) Electrolytes in TPN: Cont dec Mg, hold phos, add back slight amount of K - no changes today discussed w MD, increase Na,(to 1:2 12/23) Continue standard MVI, remove TE and add back selenium/Zn/Cr d/t liver dysfunction (watch further downtrend of tbili with increasing LFTs before adding back, can consider 1/2 TE) Continue ICU resistant scale SSI, adjust as needed based on 24h CBGs and insulin use Bmp/Mg/Phos in AM  JG:[id number], PharmD, BCPS, BCCCP Clinical Pharmacist [phone]  Please check AMION for all [facility] Pharmacy numbers  11/02/2020 9:08 AM

## 2020-11-03 DIAGNOSIS — I6601 Occlusion and stenosis of right middle cerebral artery: Secondary | ICD-10-CM | POA: Diagnosis not present

## 2020-11-03 LAB — POCT I-STAT 7, (LYTES, BLD GAS, ICA,H+H)
Acid-base deficit: 5 mmol/L — ABNORMAL HIGH (ref 0.0–2.0)
Bicarbonate: 19.2 mmol/L — ABNORMAL LOW (ref 20.0–28.0)
Calcium, Ion: 1.07 mmol/L — ABNORMAL LOW (ref 1.15–1.40)
HCT: 23 % — ABNORMAL LOW (ref 39.0–52.0)
Hemoglobin: 7.8 g/dL — ABNORMAL LOW (ref 13.0–17.0)
O2 Saturation: 87 %
Potassium: 3.6 mmol/L (ref 3.5–5.1)
Sodium: 136 mmol/L (ref 135–145)
TCO2: 20 mmol/L — ABNORMAL LOW (ref 22–32)
pCO2 arterial: 31.5 mmHg — ABNORMAL LOW (ref 32.0–48.0)
pH, Arterial: 7.394 (ref 7.350–7.450)
pO2, Arterial: 53 mmHg — ABNORMAL LOW (ref 83.0–108.0)

## 2020-11-03 LAB — BASIC METABOLIC PANEL
Anion gap: 21 — ABNORMAL HIGH (ref 5–15)
BUN: 61 mg/dL — ABNORMAL HIGH (ref 8–23)
CO2: 22 mmol/L (ref 22–32)
Calcium: 7.5 mg/dL — ABNORMAL LOW (ref 8.9–10.3)
Chloride: 95 mmol/L — ABNORMAL LOW (ref 98–111)
Creatinine, Ser: 2.68 mg/dL — ABNORMAL HIGH (ref 0.61–1.24)
GFR, Estimated: 26 mL/min — ABNORMAL LOW (ref >60)
Glucose, Bld: 158 mg/dL — ABNORMAL HIGH (ref 70–99)
Potassium: 3.6 mmol/L (ref 3.5–5.1)
Sodium: 138 mmol/L (ref 135–145)

## 2020-11-03 LAB — RENAL FUNCTION PANEL
Albumin: <1 g/dL — ABNORMAL LOW (ref 3.5–5.0)
Albumin: 1.1 g/dL — ABNORMAL LOW (ref 3.5–5.0)
Anion gap: 23 — ABNORMAL HIGH (ref 5–15)
Anion gap: 16 — ABNORMAL HIGH (ref 5–15)
BUN: 70 mg/dL — ABNORMAL HIGH (ref 8–23)
BUN: 51 mg/dL — ABNORMAL HIGH (ref 8–23)
CO2: 20 mmol/L — ABNORMAL LOW (ref 22–32)
CO2: 22 mmol/L (ref 22–32)
Calcium: 7.8 mg/dL — ABNORMAL LOW (ref 8.9–10.3)
Calcium: 7.4 mg/dL — ABNORMAL LOW (ref 8.9–10.3)
Chloride: 92 mmol/L — ABNORMAL LOW (ref 98–111)
Chloride: 99 mmol/L (ref 98–111)
Creatinine, Ser: 2.92 mg/dL — ABNORMAL HIGH (ref 0.61–1.24)
Creatinine, Ser: 2.2 mg/dL — ABNORMAL HIGH (ref 0.61–1.24)
GFR, Estimated: 23 mL/min — ABNORMAL LOW (ref >60)
GFR, Estimated: 32 mL/min — ABNORMAL LOW (ref >60)
Glucose, Bld: 490 mg/dL — ABNORMAL HIGH (ref 70–99)
Glucose, Bld: 136 mg/dL — ABNORMAL HIGH (ref 70–99)
Phosphorus: 2.8 mg/dL (ref 2.5–4.6)
Phosphorus: 1.8 mg/dL — ABNORMAL LOW (ref 2.5–4.6)
Potassium: 3.7 mmol/L (ref 3.5–5.1)
Potassium: 3.7 mmol/L (ref 3.5–5.1)
Sodium: 135 mmol/L (ref 135–145)
Sodium: 137 mmol/L (ref 135–145)

## 2020-11-03 LAB — CBC WITH DIFFERENTIAL/PLATELET
Abs Immature Granulocytes: 9.2 10*3/uL — ABNORMAL HIGH (ref 0.00–0.07)
Basophils Absolute: 0.2 10*3/uL — ABNORMAL HIGH (ref 0.0–0.1)
Basophils Relative: 1 %
Eosinophils Absolute: 0.7 10*3/uL — ABNORMAL HIGH (ref 0.0–0.5)
Eosinophils Relative: 3 %
HCT: 25.2 % — ABNORMAL LOW (ref 39.0–52.0)
Hemoglobin: 8.4 g/dL — ABNORMAL LOW (ref 13.0–17.0)
Immature Granulocytes: 34 %
Lymphocytes Relative: 7 %
Lymphs Abs: 1.8 10*3/uL (ref 0.7–4.0)
MCH: 29.8 pg (ref 26.0–34.0)
MCHC: 33.3 g/dL (ref 30.0–36.0)
MCV: 89.4 fL (ref 80.0–100.0)
Monocytes Absolute: 0.7 10*3/uL (ref 0.1–1.0)
Monocytes Relative: 3 %
Neutro Abs: 14.4 10*3/uL — ABNORMAL HIGH (ref 1.7–7.7)
Neutrophils Relative %: 52 %
Platelets: 66 10*3/uL — ABNORMAL LOW (ref 150–400)
RBC: 2.82 MIL/uL — ABNORMAL LOW (ref 4.22–5.81)
RDW: 17.7 % — ABNORMAL HIGH (ref 11.5–15.5)
WBC: 26.9 10*3/uL — ABNORMAL HIGH (ref 4.0–10.5)
nRBC: 10.9 % — ABNORMAL HIGH (ref 0.0–0.2)

## 2020-11-03 LAB — FIBRINOGEN: Fibrinogen: 434 mg/dL (ref 210–475)

## 2020-11-03 LAB — GLUCOSE, CAPILLARY
Glucose-Capillary: 117 mg/dL — ABNORMAL HIGH (ref 70–99)
Glucose-Capillary: 135 mg/dL — ABNORMAL HIGH (ref 70–99)
Glucose-Capillary: 147 mg/dL — ABNORMAL HIGH (ref 70–99)
Glucose-Capillary: 150 mg/dL — ABNORMAL HIGH (ref 70–99)
Glucose-Capillary: 153 mg/dL — ABNORMAL HIGH (ref 70–99)
Glucose-Capillary: 362 mg/dL — ABNORMAL HIGH (ref 70–99)
Glucose-Capillary: 95 mg/dL (ref 70–99)

## 2020-11-03 LAB — MAGNESIUM
Magnesium: 2.8 mg/dL — ABNORMAL HIGH (ref 1.7–2.4)
Magnesium: 2.4 mg/dL (ref 1.7–2.4)

## 2020-11-03 LAB — HEPATIC FUNCTION PANEL
ALT: 2184 U/L — ABNORMAL HIGH (ref 0–44)
AST: 985 U/L — ABNORMAL HIGH (ref 15–41)
Albumin: 1.1 g/dL — ABNORMAL LOW (ref 3.5–5.0)
Alkaline Phosphatase: 235 U/L — ABNORMAL HIGH (ref 38–126)
Bilirubin, Direct: 3.9 mg/dL — ABNORMAL HIGH (ref 0.0–0.2)
Indirect Bilirubin: 2.1 mg/dL — ABNORMAL HIGH (ref 0.3–0.9)
Total Bilirubin: 6 mg/dL — ABNORMAL HIGH (ref 0.3–1.2)
Total Protein: 4.3 g/dL — ABNORMAL LOW (ref 6.5–8.1)

## 2020-11-03 LAB — PHOSPHORUS: Phosphorus: 2.3 mg/dL — ABNORMAL LOW (ref 2.5–4.6)

## 2020-11-03 MED ORDER — ZINC CHLORIDE 1 MG/ML IV SOLN
INTRAVENOUS | Status: DC
  Filled 2020-11-03: qty 804

## 2020-11-03 MED ORDER — ALBUMIN HUMAN 25 % IV SOLN
12.5000 g | Freq: Once | INTRAVENOUS | Status: AC
  Administered 2020-11-03: 12.5 g via INTRAVENOUS
  Filled 2020-11-03: qty 50

## 2020-11-03 MED ORDER — VASOPRESSIN 20 UNITS/100 ML INFUSION FOR SHOCK
0.0000 [IU]/min | INTRAVENOUS | Status: DC
  Administered 2020-11-03 – 2020-11-04 (×3): 0.03 [IU]/min via INTRAVENOUS
  Filled 2020-11-03 (×3): qty 100

## 2020-11-03 MED ORDER — CALCIUM GLUCONATE-NACL 1-0.675 GM/50ML-% IV SOLN
1.0000 g | Freq: Once | INTRAVENOUS | Status: AC
  Administered 2020-11-04: 1000 mg via INTRAVENOUS
  Filled 2020-11-03: qty 50

## 2020-11-03 NOTE — Progress Notes (Signed)
NAME:  Mark May, MRN:  [id number], DOB:  30-Apr-1956, LOS: 29 ADMISSION DATE:  10/23/2020, CONSULTATION DATE: 10/26/2020 REFERRING MD: Dr. Curtis Sites, CHIEF COMPLAINT: Left-sided weakness  Brief History   64 year old male with HTN and HLD who presented with left-sided weakness, noted to have right MCA occlusion status post thrombectomy and stent placement 11/5.  Hospitalization complicated by hypoxic respiratory failure secondary to pulmonary edema requiring BiPAP and lasix.  Awaiting CIR placement however, on the evening of 11/10, he was noted to become more lethargic with tachycardia and tachypnea.  Had complained of abdominal pain with some distention and chest pain which resolved after burping.  KUB obtained which was normal.  On 11/11, he was somewhat more responsive but now with increasing sCr in which lisinopril was stopped and development of fever 102.6  TRH initially consulted for help with medical management however on their evaluation, PCCM consulted given ill appearance and developing hypotension and new onset Afib with RVR found to have sepsis with RLL pneumonia likely due to aspiration started on zosyn. PCCM consulted for continue right abdominal pain and distension with concern for bowel perforation secondary to Cortrak.  On 11/13, CT abd/ pelvis showed large right perinephric fluid collection and pockets of air concerning for an infectious process/abscess. Underwent CT guided drain placement of right retroperitoneal abscess.  On 11/15, he developed melena with anemia.  GI consulted with plans to medically manage and deferred EGD.  Surgery was consulted on 11/17 with imaging concerning for possible contained bowel perforation.  On 11/20, he developed respiratory distress requiring intubation. Underwent thoracentesis on 11/20.  Hypotensive with Hgb drob on 11/21, CT ABD/Pelvis w/ concern for active RP bleed.  Subsequently, developed hemothorax requiring OR for VATS on 11/22. Developed  progressive hypotension 11/29 and suffered cardiac arrest in setting of RP bleed requiring intubation.   Past Medical History  Hypertension Peptic ulcer disease, previous partial gastrectomy (in [place] / 1990) Gout BPH Sciatica   Significant Hospital Events   11/05 Mechanical thrombectomy of right MCA and stent 11/5 11/11 PCCM reconsulted  11/12 Cortrak placed 11/14 CT guided drainage for R RP abscess with JP 11/17 PCCM reconsulted 11/18 CCM recalled by rapid response, fever, resp distress, CXR new left infiltrates with left pleural effusion 11/20 Thoracentesis  11/21 Hypotensive with Hgb drop to 6.1 s/p 2 units PRBC, CT ABD with concerning for active bleeding 11/22 To OR for VATS, decortication  11/29 Hypotension, cardiac arrest x20 min, VDRF, profound acidosis due to new retroperitoneal bleed, severe anemia 11/29 EGD >> blood in all areas of the gastric remnant with no clearly defined source of bleeding  Consults:  IR Neurology PCCM 11/5-11/8; 11/11; 11/16-11/17, 11/18  [facility] 11/13 GI 11/15 CCS 11/17  Procedures:  R Femoral Sheath 11/6 >> 11/6 ETT 11/20 >> 11/26 RUE PICC 11/17 >>  ETT 11/29 >>   Significant Diagnostic Tests:  11/5 CT head >> No acute abnormality.  11/5 CTA head / neck >> Severe distal right M1 stenosis or subocclusive thrombus, Right MCA infarct with extensive penumbra, 4 mm right supraclinoid ICA aneurysm. Widely patent cervical carotid and vertebral arteries. 11/5 Postprocedure CT head >> Hypodensity in the right lateral temporal lobe now shows mild hemorrhage or contrast enhancement. This is most consistent with acute infarct. There has been interval stenting of the right middle cerebral artery. 11/6 MRI brain >> Fairly extensive patchy acute/early subacute infarcts within the right MCA/watershed territory.  Additional small acute/early subacute infarcts within the dorsal right thalamus and  left basal ganglia. 11/6  MRA head >>  Interval stenting  of the M1/M2 right middle cerebral artery. Flow related signal is present proximal and distal to the stent suggestive of stent patency. 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 11/6 TTE >> LVEF 70-75%, no RWMA, Grade II diastolic dysfunction, elevated LA pressure 11/13 CT ABD/Pelvis >> large right perinephric fluid collection and pockets of air concerning for an infectious process/abscess. Cholelithiasis. Nonobstructing right renal calculi. No hydronephrosis. Colonic diverticulosis. No bowel obstruction. Partially visualized small bilateral pleural effusions with complete consolidative changes of the visualized lower lobes. 11/16 CT ABD/Pelvis >> interval placement of drainage catheter into the right perinephric space in the area of previously seen gas and fluid collection. Fluid collection has decreased since prior study. There is now contrast material seen within the perinephric space and extending into the right paracolic gutter. This is concerning for possible fistulous communication to the colon. May consider contrast injection through the drainage catheter under fluoroscopic guidance to assess for enteric fistula. Moderate bilateral pleural effusions with bibasilar atelectasis or consolidation, unchanged.  Cholelithiasis.  NG tube in the stomach.  Aortic atherosclerosis.  Small to moderate free fluid in the pelvis. 11/20 L Thoracentesis >> 650 ml cloudy yellow fluid 11/21 CT abd/ pelvis >> very large area of intra-abdominal fluid and blood within the left upper quadrant, with an appearance worrisome for active bleeding.  Stable position of the right-sided percutaneous drainage catheter with a moderate amount of surrounding contained free air, fluid and inflammatory fat stranding. Fistulous communication with the adjacent portion of large bowel cannot be excluded. Moderate to marked amount of para muscular subcutaneous inflammatory fat stranding along the lateral  aspects of the left abdominal and pelvic walls. 11/30 CT abdomen pelvis chest >> RLQ percutaneous drainage catheter in place, decreased size abscess cavity. This is adjacent to the distal second portion of the duodenum but no evidence of extravasation of contrast.  Right retroperitoneal hematoma along the course of the drainage catheter 4.9 x 19.9 x 5.4 cm (right psoas, iliacus).  Left greater than right pleural effusions with a left-sided chest tube in place, left extrapleural collection.  Micro Data:  MRSA PCR 11/5 >> negative COVID 11/5 >> negative Flu 11/5 >> negative UC 11/10 >> neg BCx2 11/11 >> neg BC x2 11/13 >> neg Right RRP abscess 11/14 >> moderate candida, klebsiella >> R-ampicillin, S-cefazolin  BCx 2 11/19 >> negative  Left pleural fluid 11/20 >> Klebsiella >> R-ampicillin, I-unasyn, S-cefazolin, ceftriaxone, gent, imipenem  Antimicrobials:  Cefazolin 11/5 >> 11/6 Flagyl 11/11 x1 Cefepime 11/11, 11/18 >> 11/19  Vanco 11/12 >> 11/16, 11/18  Unasyn 11/17 >> 11/18  Flagyl 11/18 >> 11/19  Diflucan 11/17 >>  Zosyn 11/11 >> 11/17, 11/19 >>   Interim history/subjective:   Brady overnight. Temp down to 22F. Bear hugger placed. Breathed over vent at night received fentanyl PRN x1. Not responsive to pain, weak to absent cough, gag. Pupils are not blown but minimally reactive, core temp up to 75F.  Objective   Blood pressure (!) 88/67, pulse 89, temperature (!) 96.4 F (35.8 C), temperature source Axillary, resp. rate (!) 30, weight 71.1 kg, SpO2 93 %. CVP:  [5 mmHg-8 mmHg] 6 mmHg  Vent Mode: PRVC FiO2 (%):  [30 %] 30 % Set Rate:  [30 bmp] 30 bmp Vt Set:  [530 mL] 530 mL PEEP:  [5 cmH20] 5 cmH20 Plateau Pressure:  [23 cmH20-31 cmH20] 24 cmH20   Intake/Output Summary (Last 24  hours) at 11/03/2020 0926 Last data filed at 11/03/2020 0900 Gross per 24 hour  Intake 3283.91 ml  Output 2824 ml  Net 459.91 ml   Filed Weights   11/01/20 0438 11/02/20 0420 11/03/20 0316   Weight: 66.8 kg 70.7 kg 71.1 kg    Physical Exam General: Thin chronically ill man, ventilated HEENT: Oropharynx clear, ET tube in place, some temporal wasting Neuro: Did not respond to pain, does not open eyes or track.  No sedation running CV: Regular, distant, no murmur PULM: Ventilated breath sounds, no wheezing or crackles.   GI: Abdomen somewhat firm, right-sided drain in place unchanged, hypoactive bowel sounds Extremities: generalized edema Skin: No rash    Resolved Hospital Problems:  Hypernatremia  Assessment & Plan:    Acute respiratory failure secondary to aspiration pneumonia with bilateral pleural effusion, left klebsiella empyema, ? Connection with abdominal process Hemothorax following thoracentesis, s/p VATS 11/22. No active bleeding after clot removal. Recurrent acute respiratory failure in setting cardiac arrest, acidosis, retroperitoneal bleed S/p thora on 11/20 with exudative properties, klebsiella in pleural culture & RP abscess.  s/post thora extravasation on CTA chest. Cardiac arrest 11/29, reintubated in setting of RP bleed. -Continue PRVC -Stable PEEP, FiO2 needs - minimal settings -CT removed 12/2 -Follow intermittent chest x-ray including 12/3 - bilateral effusions, volume overload  Mixed Shock, Septic + ABLA / Hypovolemic, RLL PNA, Intra-Abdominal Abscess.   Recurrent on 11/29 due to acute increased retroperitoneal bleeding and also upper GI bleeding.  Resulting cardiac arrest, CPR 20 minutes.  Initial shock multifactorial, ABLA and septic. S/p 2 units blood on 11/21.  Klebsiella Empyema + fluid collection in abd. Recurrent episode 11/29 appears to be more likely due to acute blood loss Suspected aspiration and intraabdominal abscess (klebsiella and candida) in setting of bowel perforation, left empyema.  No clear evidence for connection between the intra-abdominal process and pleural space -Continue diflucan (started 11/17), zosyn (started 11/19),   duration difficult to determine given perforation as source -Continue efforts to wean pressors, goal MAP 65 -PPI BID  Intra-abdominal/retroperitoneal abscess with Contained Bowel Perforation.   Hx of PUD, Partial Gastrectomy No clear persistent connection between duodenum and abscess based on CT 11/29 -Appreciate CCS, IR assistance.  No role for surgery at this point -Antibiotics, antifungals as above  Retroperitoneal Bleed / Hematoma  4.9x19.9x5.4 cm, appears source of bleeding related to drain and RP collection, not from gastric or duodenal source -Anticoagulation is on hold -Follow with serial CBC -Follow drainage from retroperitoneal tube, gastric tube -Appreciate gastroenterology assistance  Acute encephalopathy. Currently off sedation for over since 11/30. Minimal PRN use. Consider toxic metabolic especially given progressive renal failure. Certainly concerning for possible ischemic, hypoxic brain injury given CPR 12/29. -if no improvement on CRRT, recommend withdrawing care  AKI Progressive following arrest 11/29 -Time limited trial of CRRT after multidisciplinary and family discussion 12/3 to end 12/6 -Follow BMP, urine output -Renal dose medications  Hypocalcemia  -Follow, replace if indicated  PAF with RVR Currently rate controlled. Heparin infusion stopped 11/29 with acute RP bleed. 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  TPN Pain/Anxiety/Delirium protocol (if indicated): Intermittent Versed, fentanyl VAP protocol (if indicated): Oral care  protocol DVT prophylaxis: SCDs for now GI prophylaxis: PPI BID Glucose control: SSI Mobility: bed rest Code Status: full code Family Communication: GOC with daughter, son 12/3, re-address daily with fromal conversation 12/6 after time limited trial of CRRT Disposition:  ICU  LABS    PULMONARY Recent Labs  Lab 10/07/2020 1737 10/11/2020 2055 10/31/20 1211  PHART 7.198* 7.332* 7.539*  PCO2ART 43.7 25.8* 24.7*  PO2ART 400* 170* 70*  HCO3 17.0* 13.4* 21.1  TCO2 18*  --  22  O2SAT 100.0 98.8 96.0   CBC Recent Labs  Lab 11/01/20 0423 11/02/20 0737 11/02/20 2252  HGB 8.2* 8.2* 8.7*  HCT 24.2* 23.6* 24.6*  WBC 17.7* 14.8* 12.7*  PLT 107* 81* 69*   COAGULATION Recent Labs  Lab 10/07/2020 2100 11/02/20 2252  INR 2.7* 1.9*   CARDIAC  No results for input(s): TROPONINI in the last 168 hours. No results for input(s): PROBNP in the last 168 hours.  CHEMISTRY Recent Labs  Lab 10/31/20 0231 10/31/20 1211 11/01/20 0800 11/01/20 0800 11/02/20 0421 11/02/20 0421 11/02/20 1546 11/02/20 1546 11/03/20 0317 11/03/20 0756  NA 145   < > 142  --  139  --  139  --  135 138  K 4.4   < > 4.0   < > 3.6   < > 3.3*   < > 3.7 3.6  CL 95*   < > 93*  --  90*  --  91*  --  92* 95*  CO2 23   < > 18*  --  19*  --  21*  --  20* 22  GLUCOSE 74   < > 123*  --  130*  --  130*  --  490* 158*  BUN 91*   < > 105*  --  123*  --  123*  --  70* 61*  CREATININE 3.94*   < > 5.03*  --  5.51*  --  5.41*  --  2.92* 2.68*  CALCIUM 6.4*   < > 6.5*  --  7.1*  --  7.1*  --  7.8* 7.5*  MG 2.6*  --  2.4  --  2.3  --   --   --  2.8* 2.4  PHOS 5.9*  --  6.7*  --  5.6*  --  4.5  --  2.8 2.3*   < > = values in this interval not displayed.   Estimated Creatinine Clearance: 25.7 mL/min (A) (by C-G formula based on SCr of 2.68 mg/dL (H)).  LIVER Recent Labs  Lab 10/28/2020 0529 10/18/2020 0529 10/26/2020 2100 10/31/20 0231 11/01/20 0800 11/02/20 1546 11/02/20 2252 11/03/20 0317 11/03/20 0756  AST 89*  --    --  2,275* 3,711*  --   --   --  985*  ALT 124*  --   --  1,447* 3,092*  --   --   --  2,184*  ALKPHOS 206*  --   --  185* 208*  --   --   --  235*  BILITOT 1.3*  --   --  5.8* 5.4*  --   --   --  6.0*  PROT 5.9*  --   --  4.9* 4.2*  --   --   --  4.3*  ALBUMIN 1.4*   < >  --  1.6* 1.2* <1.0*  --  <1.0* 1.1*  INR  --   --  2.7*  --   --   --  1.9*  --   --    < > = values in this interval not displayed.   INFECTIOUS Recent Labs  Lab 10/30/20 0948 10/31/20 0237 10/31/20 1158  LATICACIDVEN >11.0* 8.2* 10.8*   ENDOCRINE CBG (last 3)  Recent Labs    11/02/20 2354 11/03/20 0341 11/03/20 0719  GLUCAP 172* 147* 150*    CRITICAL CARE Performed by: Lesia Sago Shaniquia Brafford   Total critical care time: 31 minutes  Critical care time was exclusive of separately billable procedures and treating other patients.  Critical care was necessary to treat or prevent imminent or life-threatening deterioration.  Critical care was time spent personally by me on the following activities: development of treatment plan with patient and/or surrogate as well as nursing, discussions with consultants, evaluation of patient's response to treatment, examination of patient, obtaining history from patient or surrogate, ordering and performing treatments and interventions, ordering and review of laboratory studies, ordering and review of radiographic studies, pulse oximetry and re-evaluation of patient's condition.

## 2020-11-03 NOTE — Progress Notes (Signed)
Patient ID: Mark May, male   DOB: 27-Mar-1956, 64 y.o.   MRN: [id number] S: STill no response despite being off of all sedation O:BP (!) 84/64   Pulse 83   Temp (!) 97.4 F (36.3 C) (Axillary)   Resp (!) 30   Wt 71.1 kg   SpO2 95%   BMI 24.55 kg/m   Intake/Output Summary (Last 24 hours) at 11/03/2020 1242 Last data filed at 11/03/2020 1200 Gross per 24 hour  Intake 3227.58 ml  Output 3321 ml  Net -93.42 ml   Intake/Output: I/O last 3 completed shifts: In: 4903.1 [I.V.:4630; Other:10; IV Piggyback:263.1] Out: 2380 [Drains:10; Other:2370]  Intake/Output this shift:  Total I/O In: 629.4 [I.V.:579.4; IV Piggyback:50] Out: 941 [Other:941] Weight change: 0.4 kg Gen: cachectic, chronically ill-appearing male intubated and unresponsive CVS: RRR Resp: occ rhonchi Abd: +BS, soft Ext: 1+ anasarca  Recent Labs  Lab 10/18/2020 0529 10/13/2020 1628 10/31/20 0231 10/31/20 0231 10/31/20 1211 10/31/20 1426 11/01/20 0800 11/02/20 0421 11/02/20 1546 11/03/20 0317 11/03/20 0756  NA 147*   < > 145   < > 140 145 142 139 139 135 138  K 4.0   < > 4.4   < > 4.3 4.3 4.0 3.6 3.3* 3.7 3.6  CL 111   < > 95*  --   --  95* 93* 90* 91* 92* 95*  CO2 24   < > 23  --   --  19* 18* 19* 21* 20* 22  GLUCOSE 175*   < > 74  --   --  75 123* 130* 130* 490* 158*  BUN 56*   < > 91*  --   --  95* 105* 123* 123* 70* 61*  CREATININE 1.46*   < > 3.94*  --   --  4.38* 5.03* 5.51* 5.41* 2.92* 2.68*  ALBUMIN 1.4*  --  1.6*  --   --   --  1.2*  --  <1.0* <1.0* 1.1*  CALCIUM 7.8*   < > 6.4*  --   --  6.3* 6.5* 7.1* 7.1* 7.8* 7.5*  PHOS 2.9  --  5.9*  --   --   --  6.7* 5.6* 4.5 2.8 2.3*  AST 89*  --  2,275*  --   --   --  3,711*  --   --   --  985*  ALT 124*  --  1,447*  --   --   --  3,092*  --   --   --  2,184*   < > = values in this interval not displayed.   Liver Function Tests: Recent Labs  Lab 10/31/20 0231 10/31/20 0231 11/01/20 0800 11/01/20 0800 11/02/20 1546 11/03/20 0317 11/03/20 0756  AST  2,275*  --  3,711*  --   --   --  985*  ALT 1,447*  --  3,092*  --   --   --  2,184*  ALKPHOS 185*  --  208*  --   --   --  235*  BILITOT 5.8*  --  5.4*  --   --   --  6.0*  PROT 4.9*  --  4.2*  --   --   --  4.3*  ALBUMIN 1.6*   < > 1.2*   < > <1.0* <1.0* 1.1*   < > = values in this interval not displayed.   No results for input(s): LIPASE, AMYLASE in the last 168 hours. No results for input(s): AMMONIA in the last 168  hours. CBC: Recent Labs  Lab 10/28/20 1537 10/28/20 1537 10/16/2020 0529 10/19/2020 1735 10/31/20 0214 10/31/20 1211 10/31/20 1426 10/31/20 1426 11/01/20 0423 11/02/20 0737 11/02/20 2252  WBC 9.6   < > 12.5*   < > 19.8*  --  14.6*   < > 17.7* 14.8* 12.7*  NEUTROABS 7.6  --  10.0*  --   --   --   --   --   --   --  9.0*  HGB 8.0*   < > 8.2*   < > 9.2*   < > 8.8*   < > 8.2* 8.2* 8.7*  HCT 24.7*   < > 26.0*   < > 26.3*   < > 26.5*   < > 24.2* 23.6* 24.6*  MCV 86.7   < > 87.5   < > 82.4  --  86.9  --  88.3 86.1 87.9  PLT 268   < > 303   < > 147*  --  141*   < > 107* 81* 69*   < > = values in this interval not displayed.   Cardiac Enzymes: No results for input(s): CKTOTAL, CKMB, CKMBINDEX, TROPONINI in the last 168 hours. CBG: Recent Labs  Lab 11/02/20 1934 11/02/20 2354 11/03/20 0341 11/03/20 0719 11/03/20 1105  GLUCAP 147* 172* 147* 150* 135*    Iron Studies: No results for input(s): IRON, TIBC, TRANSFERRIN, FERRITIN in the last 72 hours. Studies/Results: DG CHEST PORT 1 VIEW  Result Date: 11/02/2020 CLINICAL DATA:  Central line placement. EXAM: PORTABLE CHEST 1 VIEW COMPARISON:  Earlier same day FINDINGS: 1425 hours. Endotracheal tube tip is 4.1 cm above the base of the carina. Right IJ central line tip overlies the distal SVC level. Right PICC line tip is also position over the region of the distal SVC. Bibasilar collapse/consolidative opacity is stable with no substantial change bilateral pleural effusions. Telemetry leads overlie the chest. IMPRESSION: 1.  Right IJ central line tip overlies the distal SVC level. No pneumothorax. 2. Otherwise no substantial interval change. Electronically Signed   By: Misty Stanley M.D.   On: 11/02/2020 14:59   DG Chest Port 1 View  Result Date: 11/02/2020 CLINICAL DATA:  Respiratory failure. EXAM: PORTABLE CHEST 1 VIEW COMPARISON:  11/01/2020. FINDINGS: Interim removal of NG tube and left chest tube. Endotracheal tube and right PICC line in unchanged position. PICC line tip noted over the right atrium. Heart size stable. Persistent atelectasis/infiltrate left lung base and small left pleural effusion. Persistent left-sided pleural thickening. Progressive diffuse right lung infiltrate/edema. Persistent right pleural effusion. No pneumothorax. IMPRESSION: 1. Interim removal of NG tube and left chest tube. No pneumothorax. 2. Persistent atelectasis/infiltrate left lung base and small left pleural effusion. Persistent left-sided pleural thickening. 3. Progressive diffuse right lung infiltrate/edema. Persistent right pleural effusion. Electronically Signed   By: Marcello Moores  Register   On: 11/02/2020 07:36   .  stroke: mapping our early stages of recovery book   Does not apply Once  . chlorhexidine gluconate (MEDLINE KIT)  15 mL Mouth Rinse BID  . Chlorhexidine Gluconate Cloth  6 each Topical Daily  . insulin aspart  3-9 Units Subcutaneous Q4H  . mouth rinse  15 mL Mouth Rinse 10 times per day  . pantoprazole  40 mg Intravenous Q12H  . sodium chloride flush  10-40 mL Intracatheter Q12H  . sodium chloride flush  10-40 mL Intracatheter Q12H  . sodium chloride flush  5 mL Intracatheter Q8H    BMET  Component Value Date/Time   NA 138 11/03/2020 0756   NA 140 03/09/2018 1620   NA 136 01/06/2015 1448   K 3.6 11/03/2020 0756   K 3.6 01/06/2015 1448   CL 95 (L) 11/03/2020 0756   CL 99 01/06/2015 1448   CO2 22 11/03/2020 0756   CO2 28 01/06/2015 1448   GLUCOSE 158 (H) 11/03/2020 0756   GLUCOSE 154 (H) 01/06/2015 1448    BUN 61 (H) 11/03/2020 0756   BUN 12 03/09/2018 1620   BUN 12 01/06/2015 1448   CREATININE 2.68 (H) 11/03/2020 0756   CREATININE 0.88 01/06/2015 1448   CALCIUM 7.5 (L) 11/03/2020 0756   CALCIUM 8.7 01/06/2015 1448   GFRNONAA 26 (L) 11/03/2020 0756   GFRNONAA >60 01/06/2015 1448   GFRAA 113 03/09/2018 1620   GFRAA >60 01/06/2015 1448   CBC    Component Value Date/Time   WBC 12.7 (H) 11/02/2020 2252   RBC 2.80 (L) 11/02/2020 2252   HGB 8.7 (L) 11/02/2020 2252   HGB 15.9 03/09/2018 1620   HCT 24.6 (L) 11/02/2020 2252   HCT 46.9 03/09/2018 1620   PLT 69 (L) 11/02/2020 2252   PLT 331 03/09/2018 1620   MCV 87.9 11/02/2020 2252   MCV 90 03/09/2018 1620   MCV 80 01/06/2015 1448   MCH 31.1 11/02/2020 2252   MCHC 35.4 11/02/2020 2252   RDW 17.6 (H) 11/02/2020 2252   RDW 14.7 03/09/2018 1620   RDW 17.1 (H) 01/06/2015 1448   LYMPHSABS 0.8 11/02/2020 2252   LYMPHSABS 1.9 03/09/2018 1620   LYMPHSABS 0.9 (L) 01/06/2015 1448   MONOABS 0.5 11/02/2020 2252   MONOABS 0.5 01/06/2015 1448   EOSABS 0.1 11/02/2020 2252   EOSABS 0.1 03/09/2018 1620   EOSABS 0.0 01/06/2015 1448   BASOSABS 0.1 11/02/2020 2252   BASOSABS 0.0 03/09/2018 1620   BASOSABS 0.1 01/06/2015 1448    Assessment/Plan: 1. AKI, oliguric- presumably due to ischemic ATN in setting of shock and ABLA. Renal dose meds. No emergent indication for dialysis, however if he remains anuric and worsening electrolytes and mental status.  1. Check bladder scans 2. No evidence of obstruction on CT of abd/pelvis on 10/21/20 but may need to repeat. 3. Remains anuric and discussed with PCCM staff and patient's son that if they wish to continue with aggressive measures he will likely need to start CRRT in the next 24 hours. 4. Discussed with the son that this would be on a short term basis only.   5. Started CRRT on 11/02/20 and re-evaluate early next week and if he is not improving clinically we would stop due to medical  futility. 2. Metabolic acidosis- due to #1. Currently on bicarb drip but CO2 dropping. 3. Acute respiratory failure due to aspiration pneumonia- on vent per PCCM 4. Shock- multifactorial with infection and ABLA as well as A fib and RVR. Currently on levophed drip. Wean per PCCM 5. Klebsiella infection- from pleural culture and retroperitoneal abscess. Currently on Zosyn. Need to adjust dose due to AKI and GFR <20 6. ABLA from GI bleed- cont to transfuse as needed 7. Duodenal perforation- on abx. GI and surgery following. 8. PAF with RVR- currently rate controlled with amiodarone. No heparin due to GIB.  9. Left hemothorax s/p VATS. 10. Severe protein malnutrition- per PCCM 11. 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 12. Disposition- poor overall prognosis. Continue with aggressive measures for now but may benefit from Palliative care consult  to help set goals/limits of care.   Donetta Potts, MD [employer] [phone]

## 2020-11-03 NOTE — Progress Notes (Signed)
eLink Physician-Brief Progress Note Patient Name: Juaquin Ludington DOB: 02-Oct-1956 MRN: [id number]   Date of Service  11/03/2020  HPI/Events of Note  Small Oozing from HD cath for over 24 hrs. Thrombocytopenia stable. Hg ok.  Changed guaz 4.4 , twice. Last night changed once. On pressure dressing also on neck HD cath site. On CRRT. S/p GI bleeding. No melena. S/p hemothorax. RPB. JP drain -10 cc.   eICU Interventions  -will get DIC panel. If worsening oozing will consider FFP/platelet transfusion.   Discussed with Casimiro Needle, RN     Intervention Category Intermediate Interventions: Bleeding - evaluation and treatment with blood products  Ranee Gosselin 11/03/2020, 11:46 PM

## 2020-11-03 NOTE — Progress Notes (Signed)
PHARMACY - TOTAL PARENTERAL NUTRITION CONSULT NOTE  Indication: Perforated duodenum  Patient Measurements: Weight: 71.1 kg (156 lb 12 oz)   Body mass index is 24.55 kg/m. Usual Weight: 120-125 lbs  Assessment:  64 YOM presented on Oct 28, 2020 with right MCA occlusion and underwent revascularization. PMH significant for PUD post partial gastrectomy in 1990. Patient was started on a dysphagia diet on 10/08/20 and also transferred out of the ICU. Post-op course complicated by Afib RVR, aspiration PNA/cellulitis and AKI. He was then started on TF on 10/12/20, which ended on 10/13/20 when CT showed perinephric abscess with gas formation. Drain was placed and there is concern for fistulous communication to the colon on 10/16/20 CT. Nutrition has been inadequate since admission, and Pharmacy consulted for TPN management. Per patient's son, patient has no recent weight loss and was eating a balanced diet PTA.  TPN stopped 11/29 afternoon s/p code d/t access issues, was to be resumed 11/30 however could not be started d/t access issues, triple lumen placed 12/1 and TPN restarted.    Glucose / Insulin: no hx DM - CBGs 100-130s with TPN re-start, 12 units insulin used s/p re-start Electrolytes: K 3.6, Mag 2.4 Na 138, Phos 2.3, CO2 22 Renal: SCr 3.94>5.03>5.51  CRRT started 12/3 LFTs / TGs:  AST/ALT large uptrend and now decreasing to 3,711>985/3,092>2184, Tbili up to 5.8>5.4>6, last TG wnl Prealbumin / albumin: albumin 1.1, prealbumin up 15.3c Intake / Output; MIVF: UOP down to 0 ml/kg/hr - 2170 out w CRRT, drain output 10 mL, chest tube out  GI Imaging: 11/20 CT - large IA fluid and blood in LUQ worrisome for active bleeding, renal cysts, cannot exclude fistulous communication with large bowel  NGT to be placed for CT w/contrast 11/29 >> now with bleed (stomach, retroperitoneal), decreased abscess size with drain  Surgeries / Procedures: 11/20 - intubated, thoracentesis -- drained cloudy, yellow  appearing fluid 11/22 - VATS/decortication 11/29: Re-intubated  Central access: PICC 10/17/20,TL placement 12/1 TPN start date: 10/17/20  Nutritional Goals (per RD rec on 11/23): 1950 - 2240 kCal, 115 - 130g protein; >1.8L fluid per day  Goal TPN rate is 75 ml/hr - will provide 120g protein, 270g CHO, and 61g SMOF lipids, for total 2012 kCal  Current Nutrition:  TPN; NPO  Plan:  Continue TPN at goal rate 75 mL/hr (Using Clinisol 15% d/t CRRT) Electrolytes in TPN: Cont dec Mg, hold phos - consider add back Sunday if remains low, continue current amount of , increase Na again,(to 1:2 RU:[id number]) Continue standard MVI, remove TE and add back selenium/Zn/Cr d/t liver dysfunction (watch further downtrend of tbili with increasing LFTs before adding back, can consider 1/2 TE) Continue ICU resistant scale SSI, adjust as needed based on 24h CBGs and insulin use  DC bicarb gtt Bmp/Mg/Phos in AM  Elmer Sow, PharmD, BCPS, BCCCP Clinical Pharmacist [phone]  Please check AMION for all [facility] Pharmacy numbers  11/03/2020 9:22 AM

## 2020-11-03 NOTE — Progress Notes (Signed)
eLink Physician-Brief Progress Note Patient Name: Ruxin Ransome DOB: [birth year] MRN: [id number]   Date of Service  11/03/2020  HPI/Events of Note  ABG: pao2 low. Ion calcium low Overall Hg ok < 8 now. No bleeding signs.  eICU Interventions  - increase fio2 to 50%. - MAP > 70 now.  - calcium gluconate 1 gm ordered.  discussed with Casimiro Needle, RN.      Intervention Category Intermediate Interventions: Other:;Diagnostic test evaluation  Ranee Gosselin 11/03/2020, 11:13 PM

## 2020-11-03 NOTE — Progress Notes (Signed)
eLink Physician-Brief Progress Note Patient Name: Mark May DOB: 09-02-1956 MRN: [id number]   Date of Service  11/03/2020  HPI/Events of Note  SBP 70's current B/P 78/58(66) HR 8  RN titrating Levo, now at , RN stopped pulling the ordered PFR of 50,  last ABG's on 12/1       Phos of 1.8 at 3:30, Calcium 7.4 with albunin 1.1     CRRT pre and post 4/2.5  AHRF -asp PNA effusion. S/p VATS s/p Cardiac arrest. RPB . Mixed shock. On diflucan/zosyn. RPB/hematoma/s/p GIB. AC on hold. AKI, nephro on case. CRRT. CVA.  Cr 2.2 BG 95 Hg 8.7 on 3 rd night.   On 40 % fio2.  No sedation. HR 81.   eICU Interventions  - follow CBC/ABG . - albumin bolus. - start Vasopressin.      Intervention Category Intermediate Interventions: Hypotension - evaluation and management  Ranee Gosselin 11/03/2020, 8:44 PM

## 2020-11-03 NOTE — Progress Notes (Signed)
eLink Physician-Brief Progress Note Patient Name: Mark May DOB: 05-21-1956 MRN: [id number]   Date of Service  11/03/2020  HPI/Events of Note  Heart rate 58 - 63 BPM, BP 133/58, MAP 79, patient received 1 mg of Atropine from bedside RN for heart rate 40-48.  eICU Interventions  Continue to monitor patient, if patient has sustained heart rate  < 40 or hypotension, will start low dose Dopamine infusion.        Sandara Tyree U Dorismar Chay 11/03/2020, 2:01 AM

## 2020-11-03 NOTE — Progress Notes (Addendum)
Heart rate is now sustaining in the low 50's and upper 40's. 1mg  of atropine given with HR climbing slowly to upper 50's, low 60's. E-link made aware. Waiting for further orders.

## 2020-11-03 NOTE — Progress Notes (Signed)
5 Days Post-Op  Subjective: Patient very ill on vent.  Overall in multiple system organ failure and not making improvement at this point.  ROS: See above, otherwise other systems negative  Objective: Vital signs in last 24 hours: Temp:  [93.1 F (33.9 C)-98 F (36.7 C)] 96.4 F (35.8 C) (12/04 0800) Pulse Rate:  [56-109] 88 (12/04 0800) Resp:  [10-37] 30 (12/04 0727) BP: (85-144)/(40-79) 118/76 (12/04 0800) SpO2:  [89 %-100 %] 96 % (12/04 0800) FiO2 (%):  [30 %] 30 % (12/04 0727) Weight:  [71.1 kg] 71.1 kg (12/04 0316) Last BM Date: 11/02/20  Intake/Output from previous day: 12/03 0701 - 12/04 0700 In: 3325.1 [I.V.:3165.1; IV Piggyback:150] Out: 2380 [Drains:10] Intake/Output this shift: Total I/O In: 135.1 [I.V.:135.1] Out: 199 [Other:199]  PE: Abd: distended, but stable, drain with minimal output.  Minimal bloody output present.  Only 10cc documented in last 24 hrs.  Lab Results:  Recent Labs    11/02/20 0737 11/02/20 2252  WBC 14.8* 12.7*  HGB 8.2* 8.7*  HCT 23.6* 24.6*  PLT 81* 69*   BMET Recent Labs    11/02/20 1546 11/03/20 0317  NA 139 135  K 3.3* 3.7  CL 91* 92*  CO2 21* 20*  GLUCOSE 130* 490*  BUN 123* 70*  CREATININE 5.41* 2.92*  CALCIUM 7.1* 7.8*   PT/INR Recent Labs    11/02/20 2252  LABPROT 21.4*  INR 1.9*   CMP     Component Value Date/Time   NA 135 11/03/2020 0317   NA 140 03/09/2018 1620   NA 136 01/06/2015 1448   K 3.7 11/03/2020 0317   K 3.6 01/06/2015 1448   CL 92 (L) 11/03/2020 0317   CL 99 01/06/2015 1448   CO2 20 (L) 11/03/2020 0317   CO2 28 01/06/2015 1448   GLUCOSE 490 (H) 11/03/2020 0317   GLUCOSE 154 (H) 01/06/2015 1448   BUN 70 (H) 11/03/2020 0317   BUN 12 03/09/2018 1620   BUN 12 01/06/2015 1448   CREATININE 2.92 (H) 11/03/2020 0317   CREATININE 0.88 01/06/2015 1448   CALCIUM 7.8 (L) 11/03/2020 0317   CALCIUM 8.7 01/06/2015 1448   PROT 4.2 (L) 11/01/2020 0800   PROT 7.4 03/09/2018 1620   PROT 8.0  01/06/2015 1448   ALBUMIN <1.0 (L) 11/03/2020 0317   ALBUMIN 4.8 03/09/2018 1620   ALBUMIN 4.2 01/06/2015 1448   AST 3,711 (H) 11/01/2020 0800   AST 32 01/06/2015 1448   ALT 3,092 (H) 11/01/2020 0800   ALT 44 01/06/2015 1448   ALKPHOS 208 (H) 11/01/2020 0800   ALKPHOS 123 (H) 01/06/2015 1448   BILITOT 5.4 (H) 11/01/2020 0800   BILITOT 0.6 03/09/2018 1620   BILITOT 0.3 01/06/2015 1448   GFRNONAA 23 (L) 11/03/2020 0317   GFRNONAA >60 01/06/2015 1448   GFRAA 113 03/09/2018 1620   GFRAA >60 01/06/2015 1448   Lipase  No results found for: LIPASE     Studies/Results: DG CHEST PORT 1 VIEW  Result Date: 11/02/2020 CLINICAL DATA:  Central line placement. EXAM: PORTABLE CHEST 1 VIEW COMPARISON:  Earlier same day FINDINGS: 1425 hours. Endotracheal tube tip is 4.1 cm above the base of the carina. Right IJ central line tip overlies the distal SVC level. Right PICC line tip is also position over the region of the distal SVC. Bibasilar collapse/consolidative opacity is stable with no substantial change bilateral pleural effusions. Telemetry leads overlie the chest. IMPRESSION: 1. Right IJ central line tip overlies the distal SVC  level. No pneumothorax. 2. Otherwise no substantial interval change. Electronically Signed   By: Kennith Center M.D.   On: 11/02/2020 14:59   DG Chest Port 1 View  Result Date: 11/02/2020 CLINICAL DATA:  Respiratory failure. EXAM: PORTABLE CHEST 1 VIEW COMPARISON:  11/01/2020. FINDINGS: Interim removal of NG tube and left chest tube. Endotracheal tube and right PICC line in unchanged position. PICC line tip noted over the right atrium. Heart size stable. Persistent atelectasis/infiltrate left lung base and small left pleural effusion. Persistent left-sided pleural thickening. Progressive diffuse right lung infiltrate/edema. Persistent right pleural effusion. No pneumothorax. IMPRESSION: 1. Interim removal of NG tube and left chest tube. No pneumothorax. 2. Persistent  atelectasis/infiltrate left lung base and small left pleural effusion. Persistent left-sided pleural thickening. 3. Progressive diffuse right lung infiltrate/edema. Persistent right pleural effusion. Electronically Signed   By: Maisie Fus  Register   On: 11/02/2020 07:36    Anti-infectives: Anti-infectives (From admission, onward)   Start     Dose/Rate Route Frequency Ordered Stop   11/03/20 1400  fluconazole (DIFLUCAN) IVPB 800 mg        800 mg 200 mL/hr over 120 Minutes Intravenous Every 24 hours 11/02/20 1722     11/02/20 2000  piperacillin-tazobactam (ZOSYN) IVPB 3.375 g        3.375 g 100 mL/hr over 30 Minutes Intravenous Every 6 hours 11/02/20 1720     10/31/20 1400  piperacillin-tazobactam (ZOSYN) IVPB 2.25 g  Status:  Discontinued        2.25 g 100 mL/hr over 30 Minutes Intravenous Every 8 hours 10/31/20 0737 11/02/20 1720   10/25/2020 1400  fluconazole (DIFLUCAN) IVPB 200 mg  Status:  Discontinued        200 mg 100 mL/hr over 60 Minutes Intravenous Every 24 hours 10/17/2020 1148 11/02/20 1722   10/19/20 1400  piperacillin-tazobactam (ZOSYN) IVPB 3.375 g  Status:  Discontinued        3.375 g 12.5 mL/hr over 240 Minutes Intravenous Every 8 hours 10/19/20 1150 10/31/20 0737   10/19/20 0200  vancomycin (VANCOREADY) IVPB 500 mg/100 mL  Status:  Discontinued        500 mg 100 mL/hr over 60 Minutes Intravenous Every 12 hours 10/19/20 0025 10/19/20 0951   10/19/20 0200  metroNIDAZOLE (FLAGYL) IVPB 500 mg  Status:  Discontinued        500 mg 100 mL/hr over 60 Minutes Intravenous Every 8 hours 10/19/20 0045 10/19/20 1150   10/19/20 0115  ceFEPIme (MAXIPIME) 2 g in sodium chloride 0.9 % 100 mL IVPB  Status:  Discontinued        2 g 200 mL/hr over 30 Minutes Intravenous Every 12 hours 10/19/20 0025 10/19/20 1150   10/17/20 1400  Ampicillin-Sulbactam (UNASYN) 3 g in sodium chloride 0.9 % 100 mL IVPB  Status:  Discontinued        3 g 200 mL/hr over 30 Minutes Intravenous Every 6 hours 10/17/20  1207 10/18/20 2325   10/17/20 1400  fluconazole (DIFLUCAN) IVPB 400 mg  Status:  Discontinued        400 mg 100 mL/hr over 120 Minutes Intravenous Every 24 hours 10/17/20 1207 10/13/2020 1148   10/15/20 1630  vancomycin (VANCOREADY) IVPB 500 mg/100 mL  Status:  Discontinued        500 mg 100 mL/hr over 60 Minutes Intravenous Every 12 hours 10/15/20 0940 10/17/20 1205   10/14/20 0400  vancomycin (VANCOREADY) IVPB 500 mg/100 mL  Status:  Discontinued  500 mg 100 mL/hr over 60 Minutes Intravenous Every 24 hours 10/13/20 0233 10/15/20 0940   10/13/20 0330  vancomycin (VANCOREADY) IVPB 750 mg/150 mL        750 mg 150 mL/hr over 60 Minutes Intravenous  Once 10/13/20 0233 10/13/20 0439   10/12/20 1800  ceFEPIme (MAXIPIME) 2 g in sodium chloride 0.9 % 100 mL IVPB  Status:  Discontinued        2 g 200 mL/hr over 30 Minutes Intravenous Every 24 hours 10/11/20 1718 10/11/20 1742   10/12/20 1800  vancomycin (VANCOREADY) IVPB 750 mg/150 mL  Status:  Discontinued        750 mg 150 mL/hr over 60 Minutes Intravenous Every 24 hours 10/11/20 1724 10/11/20 1758   10/12/20 0200  metroNIDAZOLE (FLAGYL) tablet 500 mg  Status:  Discontinued        500 mg Oral Every 8 hours 10/11/20 1715 10/11/20 1758   10/12/20 0030  piperacillin-tazobactam (ZOSYN) IVPB 3.375 g  Status:  Discontinued        3.375 g 12.5 mL/hr over 240 Minutes Intravenous Every 8 hours 10/11/20 1805 10/17/20 1205   10/11/20 1830  piperacillin-tazobactam (ZOSYN) IVPB 3.375 g        3.375 g 100 mL/hr over 30 Minutes Intravenous  Once 10/11/20 1805 10/11/20 1839   10/11/20 1715  ceFEPIme (MAXIPIME) 2 g in sodium chloride 0.9 % 100 mL IVPB  Status:  Discontinued        2 g 200 mL/hr over 30 Minutes Intravenous STAT 10/11/20 1710 10/11/20 1742   10/11/20 1715  metroNIDAZOLE (FLAGYL) IVPB 500 mg  Status:  Discontinued        500 mg 100 mL/hr over 60 Minutes Intravenous STAT 10/11/20 1710 10/11/20 1807   10/11/20 1715  vancomycin (VANCOCIN)  IVPB 1000 mg/200 mL premix  Status:  Discontinued        1,000 mg 200 mL/hr over 60 Minutes Intravenous STAT 10/11/20 1710 10/11/20 1804   10/18/2020 1541  ceFAZolin (ANCEF) 2-4 GM/100ML-% IVPB       Note to Pharmacy: [facility]   : cabinet override      10/16/2020 1541 10/06/20 0344       Assessment/Plan Cardiac arrest 11/29  Retroperitoneal bleeding/hematoma - 4.9 x 19.9 x 5.4 cm, continue to hold heparin,this appears to have stabilized, best I can tell the source of bleeding is related to the drain and rp collection and is not from a gastric or duodenal source. I think the blood in the stomach was coming from the perforation and the connection to the bleeding area in the rp.  Hx of PUD s/p partial gastrectomy 1990 in [place]  Contained bowel perforation with intra-abdominal abscess - s/p IR drain placement 11/14 - growing klebsiella, continue drain. -suspectthatthe second portion of theduodenum is most likely site of perforation based on CT. - s/p emergent EGD at bedside 11/29 by Dr. Bosie Clos due to blood encountered during NG placement and cardiac arrest. Blood was found in gastric remnant and one of the jejunal limbs but no source of bleeding was found. -NGT has not been to suction. Aspirated with no return essentially. Will DC NGT - recommendSTRICTNPO and TPN - no acute surgical needs. Hold heparin. -the perforation is contained, there is no real surgery to fix the hemorrhage. There is a role for surgery at some point but this is not needed right now and would not be well tolerated for what this would require.   L hemothoraxafter thoracentesis- S/p VATS  11/22 Dr. Cliffton Asters, per TCTS, pleural culture also growing klebsiella.CT out several days ago.  FEN: NPO,IVF, PICC and TPN  GOT:LXBW, anticoagulation held in the setting of acute blood loss ID: current abx are zosyn and diflucan; RP cultures and pleural cultures both growing klebsiella, raising concern that the  two collections are connected.  Patient critically ill and primary to have further discussions today about withdrawal given his progressive deterioration.    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

## 2020-11-04 DIAGNOSIS — I6601 Occlusion and stenosis of right middle cerebral artery: Secondary | ICD-10-CM | POA: Diagnosis not present

## 2020-11-04 DIAGNOSIS — I469 Cardiac arrest, cause unspecified: Secondary | ICD-10-CM | POA: Diagnosis not present

## 2020-11-04 DIAGNOSIS — Z515 Encounter for palliative care: Secondary | ICD-10-CM | POA: Diagnosis not present

## 2020-11-04 DIAGNOSIS — Z66 Do not resuscitate: Secondary | ICD-10-CM | POA: Diagnosis not present

## 2020-11-04 DIAGNOSIS — R579 Shock, unspecified: Secondary | ICD-10-CM

## 2020-11-04 DIAGNOSIS — I63311 Cerebral infarction due to thrombosis of right middle cerebral artery: Secondary | ICD-10-CM | POA: Diagnosis not present

## 2020-11-04 DIAGNOSIS — R0603 Acute respiratory distress: Secondary | ICD-10-CM | POA: Diagnosis not present

## 2020-11-04 DIAGNOSIS — N179 Acute kidney failure, unspecified: Secondary | ICD-10-CM | POA: Diagnosis not present

## 2020-11-04 DIAGNOSIS — Z20822 Contact with and (suspected) exposure to covid-19: Secondary | ICD-10-CM | POA: Diagnosis not present

## 2020-11-04 LAB — POCT I-STAT 7, (LYTES, BLD GAS, ICA,H+H)
Acid-base deficit: 11 mmol/L — ABNORMAL HIGH (ref 0.0–2.0)
Bicarbonate: 15.8 mmol/L — ABNORMAL LOW (ref 20.0–28.0)
Calcium, Ion: 1.12 mmol/L — ABNORMAL LOW (ref 1.15–1.40)
HCT: 27 % — ABNORMAL LOW (ref 39.0–52.0)
Hemoglobin: 9.2 g/dL — ABNORMAL LOW (ref 13.0–17.0)
O2 Saturation: 90 %
Patient temperature: 97.5
Potassium: 3.5 mmol/L (ref 3.5–5.1)
Sodium: 134 mmol/L — ABNORMAL LOW (ref 135–145)
TCO2: 17 mmol/L — ABNORMAL LOW (ref 22–32)
pCO2 arterial: 36.1 mmHg (ref 32.0–48.0)
pH, Arterial: 7.247 — ABNORMAL LOW (ref 7.350–7.450)
pO2, Arterial: 65 mmHg — ABNORMAL LOW (ref 83.0–108.0)

## 2020-11-04 LAB — RENAL FUNCTION PANEL
Albumin: 1.3 g/dL — ABNORMAL LOW (ref 3.5–5.0)
Anion gap: 18 — ABNORMAL HIGH (ref 5–15)
BUN: 32 mg/dL — ABNORMAL HIGH (ref 8–23)
CO2: 16 mmol/L — ABNORMAL LOW (ref 22–32)
Calcium: 8 mg/dL — ABNORMAL LOW (ref 8.9–10.3)
Chloride: 97 mmol/L — ABNORMAL LOW (ref 98–111)
Creatinine, Ser: 1.45 mg/dL — ABNORMAL HIGH (ref 0.61–1.24)
GFR, Estimated: 53 mL/min — ABNORMAL LOW (ref >60)
Glucose, Bld: 497 mg/dL — ABNORMAL HIGH (ref 70–99)
Phosphorus: 2 mg/dL — ABNORMAL LOW (ref 2.5–4.6)
Potassium: 3.7 mmol/L (ref 3.5–5.1)
Sodium: 131 mmol/L — ABNORMAL LOW (ref 135–145)

## 2020-11-04 LAB — CBC
HCT: 24.2 % — ABNORMAL LOW (ref 39.0–52.0)
Hemoglobin: 8.8 g/dL — ABNORMAL LOW (ref 13.0–17.0)
MCH: 33.5 pg (ref 26.0–34.0)
MCHC: 36.4 g/dL — ABNORMAL HIGH (ref 30.0–36.0)
MCV: 92 fL (ref 80.0–100.0)
Platelets: 69 10*3/uL — ABNORMAL LOW (ref 150–400)
RBC: 2.63 MIL/uL — ABNORMAL LOW (ref 4.22–5.81)
RDW: 18.6 % — ABNORMAL HIGH (ref 11.5–15.5)
WBC: 36.6 10*3/uL — ABNORMAL HIGH (ref 4.0–10.5)
nRBC: 14.8 % — ABNORMAL HIGH (ref 0.0–0.2)

## 2020-11-04 LAB — BASIC METABOLIC PANEL
Anion gap: 22 — ABNORMAL HIGH (ref 5–15)
BUN: 30 mg/dL — ABNORMAL HIGH (ref 8–23)
CO2: 13 mmol/L — ABNORMAL LOW (ref 22–32)
Calcium: 7.6 mg/dL — ABNORMAL LOW (ref 8.9–10.3)
Chloride: 100 mmol/L (ref 98–111)
Creatinine, Ser: 1.19 mg/dL (ref 0.61–1.24)
GFR, Estimated: >60 mL/min (ref >60)
Glucose, Bld: 171 mg/dL — ABNORMAL HIGH (ref 70–99)
Potassium: 3.6 mmol/L (ref 3.5–5.1)
Sodium: 135 mmol/L (ref 135–145)

## 2020-11-04 LAB — DIC (DISSEMINATED INTRAVASCULAR COAGULATION)PANEL
D-Dimer, Quant: >20 ug/mL-FEU — ABNORMAL HIGH (ref 0.00–0.50)
Fibrinogen: 395 mg/dL (ref 210–475)
INR: 1.5 — ABNORMAL HIGH (ref 0.8–1.2)
Platelets: 60 10*3/uL — ABNORMAL LOW (ref 150–400)
Prothrombin Time: 17.4 seconds — ABNORMAL HIGH (ref 11.4–15.2)
aPTT: 39 seconds — ABNORMAL HIGH (ref 24–36)

## 2020-11-04 LAB — MAGNESIUM
Magnesium: 2.7 mg/dL — ABNORMAL HIGH (ref 1.7–2.4)
Magnesium: 2.6 mg/dL — ABNORMAL HIGH (ref 1.7–2.4)

## 2020-11-04 LAB — PHOSPHORUS: Phosphorus: 3 mg/dL (ref 2.5–4.6)

## 2020-11-04 LAB — GLUCOSE, CAPILLARY
Glucose-Capillary: 121 mg/dL — ABNORMAL HIGH (ref 70–99)
Glucose-Capillary: 447 mg/dL — ABNORMAL HIGH (ref 70–99)
Glucose-Capillary: 170 mg/dL — ABNORMAL HIGH (ref 70–99)

## 2020-11-04 LAB — PREPARE RBC (CROSSMATCH)

## 2020-11-04 MED ORDER — AMIODARONE HCL IN DEXTROSE 360-4.14 MG/200ML-% IV SOLN
60.0000 mg/h | INTRAVENOUS | Status: DC
  Administered 2020-11-04 (×2): 60 mg/h via INTRAVENOUS

## 2020-11-04 MED ORDER — SODIUM CHLORIDE 0.9% IV SOLUTION: Freq: Once | INTRAVENOUS | Status: DC

## 2020-11-04 MED ORDER — GLYCOPYRROLATE 0.2 MG/ML IJ SOLN: 0.2000 mg | INTRAMUSCULAR | Status: DC | PRN

## 2020-11-04 MED ORDER — DIPHENHYDRAMINE HCL 50 MG/ML IJ SOLN: 25.0000 mg | INTRAMUSCULAR | Status: DC | PRN

## 2020-11-04 MED ORDER — ACETAMINOPHEN 325 MG PO TABS: 650.0000 mg | ORAL_TABLET | Freq: Four times a day (QID) | ORAL | Status: DC | PRN

## 2020-11-04 MED ORDER — GLYCOPYRROLATE 1 MG PO TABS: 1.0000 mg | ORAL_TABLET | ORAL | Status: DC | PRN

## 2020-11-04 MED ORDER — AMIODARONE HCL IN DEXTROSE 360-4.14 MG/200ML-% IV SOLN
30.0000 mg/h | INTRAVENOUS | Status: DC
  Filled 2020-11-04: qty 200

## 2020-11-04 MED ORDER — SODIUM BICARBONATE 8.4 % IV SOLN
INTRAVENOUS | Status: AC
  Administered 2020-11-04: 50 meq
  Filled 2020-11-04: qty 50

## 2020-11-04 MED ORDER — PHENYLEPHRINE HCL-NACL 10-0.9 MG/250ML-% IV SOLN
INTRAVENOUS | Status: AC
  Filled 2020-11-04: qty 250

## 2020-11-04 MED ORDER — PHENYLEPHRINE HCL-NACL 10-0.9 MG/250ML-% IV SOLN
0.0000 ug/min | INTRAVENOUS | Status: DC
  Administered 2020-11-04: 300 ug/min via INTRAVENOUS
  Administered 2020-11-04: 200 ug/min via INTRAVENOUS
  Administered 2020-11-04 (×3): 300 ug/min via INTRAVENOUS
  Administered 2020-11-04: 20 ug/min via INTRAVENOUS
  Administered 2020-11-04 (×3): 300 ug/min via INTRAVENOUS
  Filled 2020-11-04 (×6): qty 250
  Filled 2020-11-04: qty 500
  Filled 2020-11-04: qty 250
  Filled 2020-11-04: qty 500
  Filled 2020-11-04: qty 250

## 2020-11-04 MED ORDER — MIDAZOLAM HCL 2 MG/2ML IJ SOLN: 2.0000 mg | INTRAMUSCULAR | Status: DC | PRN

## 2020-11-04 MED ORDER — AMIODARONE LOAD VIA INFUSION
150.0000 mg | Freq: Once | INTRAVENOUS | Status: AC
  Administered 2020-11-04: 150 mg via INTRAVENOUS

## 2020-11-04 MED ORDER — ACETAMINOPHEN 650 MG RE SUPP: 650.0000 mg | Freq: Four times a day (QID) | RECTAL | Status: DC | PRN

## 2020-11-04 MED ORDER — MORPHINE SULFATE (PF) 2 MG/ML IV SOLN: 2.0000 mg | INTRAVENOUS | Status: DC | PRN

## 2020-11-04 MED ORDER — AMIODARONE HCL IN DEXTROSE 360-4.14 MG/200ML-% IV SOLN
INTRAVENOUS | Status: AC
  Filled 2020-11-04: qty 200

## 2020-11-04 MED ORDER — POLYVINYL ALCOHOL 1.4 % OP SOLN
1.0000 [drp] | Freq: Four times a day (QID) | OPHTHALMIC | Status: DC | PRN
  Filled 2020-11-04: qty 15

## 2020-11-04 MED ORDER — DEXTROSE 5 % IV SOLN: INTRAVENOUS | Status: DC

## 2020-11-05 LAB — TYPE AND SCREEN
ABO/RH(D): B POS
Antibody Screen: NEGATIVE
Unit division: 0

## 2020-11-05 LAB — BPAM RBC
Blood Product Expiration Date: 202112282359
ISSUE DATE / TIME: 202112050611
Unit Type and Rh: 7300

## 2020-11-05 LAB — PREPARE PLATELET PHERESIS: Unit division: 0

## 2020-11-05 LAB — BPAM PLATELET PHERESIS
Blood Product Expiration Date: 202112062359
ISSUE DATE / TIME: 202112050612
Unit Type and Rh: 6200

## 2020-11-05 LAB — PATHOLOGIST SMEAR REVIEW

## 2020-11-05 NOTE — Discharge Summary (Signed)
PCCM:  Please see death summary dated 12-03-2023  Josephine Igo, DO [facility] 11/05/2020 3:30 PM

## 2020-12-01 NOTE — Progress Notes (Signed)
Post mortem care given and family pulled ET out without telling the nurse all other lines removed by nurse. pt fully dressed as per request per family.

## 2020-12-01 NOTE — Progress Notes (Signed)
As per order medication dosage changed.

## 2020-12-01 NOTE — Progress Notes (Addendum)
NAME:  Mark May, MRN:  [id number], DOB:  [birth year], LOS: 19 ADMISSION DATE:  10/09/2020, CONSULTATION DATE: 10/07/2020 REFERRING MD: Dr. Demetra Shiner, CHIEF COMPLAINT: Left-sided weakness  Brief History   65 year old male with HTN and HLD who presented with left-sided weakness, noted to have right MCA occlusion status post thrombectomy and stent placement 11/5.  Hospitalization complicated by hypoxic respiratory failure secondary to pulmonary edema requiring BiPAP and lasix.  Awaiting CIR placement however, on the evening of 11/10, he was noted to become more lethargic with tachycardia and tachypnea.  Had complained of abdominal pain with some distention and chest pain which resolved after burping.  KUB obtained which was normal.  On 11/11, he was somewhat more responsive but now with increasing sCr in which lisinopril was stopped and development of fever 102.6  TRH initially consulted for help with medical management however on their evaluation, PCCM consulted given ill appearance and developing hypotension and new onset Afib with RVR found to have sepsis with RLL pneumonia likely due to aspiration started on zosyn. PCCM consulted for continue right abdominal pain and distension with concern for bowel perforation secondary to Cortrak.  On 11/13, CT abd/ pelvis showed large right perinephric fluid collection and pockets of air concerning for an infectious process/abscess. Underwent CT guided drain placement of right retroperitoneal abscess.  On 11/15, he developed melena with anemia.  GI consulted with plans to medically manage and deferred EGD.  Surgery was consulted on 11/17 with imaging concerning for possible contained bowel perforation.  On 11/20, he developed respiratory distress requiring intubation. Underwent thoracentesis on 11/20.  Hypotensive with Hgb drob on 11/21, CT ABD/Pelvis w/ concern for active RP bleed.  Subsequently, developed hemothorax requiring OR for VATS on 11/22. Developed  progressive hypotension 11/29 and suffered cardiac arrest in setting of RP bleed requiring intubation.  12/3 started on CRRT 12/5 continues to decline with worsening acidemia, shock, and hypoxemia. No improvement in neuro exam   Past Medical History  Hypertension Peptic ulcer disease, previous partial gastrectomy (in [place] / 1990) Gout BPH Sciatica   Significant Hospital Events   11/05 Mechanical thrombectomy of right MCA and stent 11/5 11/11 PCCM reconsulted  11/12 Cortrak placed 11/14 CT guided drainage for R RP abscess with JP 11/17 PCCM reconsulted 11/18 CCM recalled by rapid response, fever, resp distress, CXR new left infiltrates with left pleural effusion 11/20 Thoracentesis  11/21 Hypotensive with Hgb drop to 6.1 s/p 2 units PRBC, CT ABD with concerning for active bleeding 11/22 To OR for VATS, decortication  11/29 Hypotension, cardiac arrest x20 min, VDRF, profound acidosis due to new retroperitoneal bleed, severe anemia 11/29 EGD >> blood in all areas of the gastric remnant with no clearly defined source of bleeding 12/3 HD catheter placed for CRRT. Received 171mg fentanyl which is the last documented sedation administered. Next more recent administration was 11/30  12/5 worsening shock. Not able to remove fluid on CRRT. Increasing pressor requirement. Worsening hypoxia. Remains off of all sedation   Consults:  IR Neurology PCCM 11/5-11/8; 11/11; 11/16-11/17, 11/18  [facility]  GI 11/15 CCS 11/17  Procedures:  R Femoral Sheath 11/6 >> 11/6 ETT 11/20 >> 11/26 RUE PICC 11/17 >>  ETT 11/29 >>  12/3 HD cath> 12/5 arterial line   Significant Diagnostic Tests:  11/5 CT head >> No acute abnormality.  11/5 CTA head / neck >> Severe distal right M1 stenosis or subocclusive thrombus, Right MCA infarct with extensive penumbra, 4 mm right supraclinoid ICA  aneurysm. Widely patent cervical carotid and vertebral arteries. 11/5 Postprocedure CT head >> Hypodensity in  the right lateral temporal lobe now shows mild hemorrhage or contrast enhancement. This is most consistent with acute infarct. There has been interval stenting of the right middle cerebral artery. 11/6 MRI brain >> Fairly extensive patchy acute/early subacute infarcts within the right MCA/watershed territory.  Additional small acute/early subacute infarcts within the dorsal right thalamus and left basal ganglia. 11/6  MRA head >>  Interval stenting of the M1/M2 right middle cerebral artery. Flow related signal is present proximal and distal to the stent suggestive of stent patency. 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 11/6 TTE >> LVEF 70-75%, no RWMA, Grade II diastolic dysfunction, elevated LA pressure 11/13 CT ABD/Pelvis >> large right perinephric fluid collection and pockets of air concerning for an infectious process/abscess. Cholelithiasis. Nonobstructing right renal calculi. No hydronephrosis. Colonic diverticulosis. No bowel obstruction. Partially visualized small bilateral pleural effusions with complete consolidative changes of the visualized lower lobes. 11/16 CT ABD/Pelvis >> interval placement of drainage catheter into the right perinephric space in the area of previously seen gas and fluid collection. Fluid collection has decreased since prior study. There is now contrast material seen within the perinephric space and extending into the right paracolic gutter. This is concerning for possible fistulous communication to the colon. May consider contrast injection through the drainage catheter under fluoroscopic guidance to assess for enteric fistula. Moderate bilateral pleural effusions with bibasilar atelectasis or consolidation, unchanged.  Cholelithiasis.  NG tube in the stomach.  Aortic atherosclerosis.  Small to moderate free fluid in the pelvis. 11/20 L Thoracentesis >> 650 ml cloudy yellow fluid 11/21 CT abd/ pelvis >> very large area of  intra-abdominal fluid and blood within the left upper quadrant, with an appearance worrisome for active bleeding.  Stable position of the right-sided percutaneous drainage catheter with a moderate amount of surrounding contained free air, fluid and inflammatory fat stranding. Fistulous communication with the adjacent portion of large bowel cannot be excluded. Moderate to marked amount of para muscular subcutaneous inflammatory fat stranding along the lateral aspects of the left abdominal and pelvic walls. 11/30 CT abdomen pelvis chest >> RLQ percutaneous drainage catheter in place, decreased size abscess cavity. This is adjacent to the distal second portion of the duodenum but no evidence of extravasation of contrast.  Right retroperitoneal hematoma along the course of the drainage catheter 4.9 x 19.9 x 5.4 cm (right psoas, iliacus).  Left greater than right pleural effusions with a left-sided chest tube in place, left extrapleural collection.  Micro Data:  MRSA PCR 11/5 >> negative COVID 11/5 >> negative Flu 11/5 >> negative UC 11/10 >> neg BCx2 11/11 >> neg BC x2 11/13 >> neg Right RRP abscess 11/14 >> moderate candida, klebsiella >> R-ampicillin, S-cefazolin  BCx 2 11/19 >> negative  Left pleural fluid 11/20 >> Klebsiella >> R-ampicillin, I-unasyn, S-cefazolin, ceftriaxone, gent, imipenem  Antimicrobials:  Cefazolin 11/5 >> 11/6 Flagyl 11/11 x1 Cefepime 11/11, 11/18 >> 11/19  Vanco 11/12 >> 11/16, 11/18  Unasyn 11/17 >> 11/18  Flagyl 11/18 >> 11/19  Diflucan 11/17 >>  Zosyn 11/11 >> 11/17, 11/19 >>   Interim history/subjective:   Worsening shock overnight requiring uptitration of NE to 50 mcg and addition of neo. Continues on vaso as well at 0.03. Received albumin overnight   Remains off of sedation (last adminitered 12/3-- 139mg fentanyl. Previous 11/30)  Unable to remove fluid on CRRT  Worsening HD catheter  insertion site oozing. Received 1unit plt and 1 PRBC for both blood  loss and shock   Worsening hypoxia, FiO2 now 80%    AM BMP pending   Objective   Blood pressure (!) 128/97, pulse 89, temperature (!) 92.1 F (33.4 C), temperature source Axillary, resp. rate (!) 30, weight 73.1 kg, SpO2 95 %. CVP:  [6 mmHg-8 mmHg] 8 mmHg  Vent Mode: PRVC FiO2 (%):  [30 %-80 %] 80 % Set Rate:  [30 bmp] 30 bmp Vt Set:  [530 mL] 530 mL PEEP:  [5 cmH20] 5 cmH20 Plateau Pressure:  [24 cmH20-26 cmH20] 26 cmH20   Intake/Output Summary (Last 24 hours) at 11-25-20 5643 Last data filed at November 25, 2020 0700 Gross per 24 hour  Intake 3525.62 ml  Output 3370 ml  Net 155.62 ml   Filed Weights   11/02/20 0420 11/03/20 0316 2020/11/25 0500  Weight: 70.7 kg 71.1 kg 73.1 kg    Physical Exam General: Cachectic critically ill appearing M, appears older than stated age. Intubated, on CRRT net even, with 0 sedation is unresponsive.  HEENT: Temporal muscle wasting ETT secure mild scleral icterus tacky mm  Neuro: No response to noxious stimuli. No corneal reflex. Does not over breathe vent.  Pupils are fixed. R pupil 4.33m L pupil 467mCV: rrr s1s2 sluggish cap refill and weak 1+ pulses  PULM: Symemtrical chest expansion, mechanically ventilated. Coarse sounds  GI: Tense abdomen. Distended  GU: CRRT net even. Extremities: 3+ BUE BLE pitting edema  Skin: dry, scattered petechiae. R IJ oozing from HD site. Arterial line oozing   Resolved Hospital Problems:  Hypernatremia  Assessment & Plan:    UPDATE [id number]: -Worsening shock despite details of pressors below. Pt requiring regular bicarb pushes to offset hemodynamic instability related to worsening acidemia -Additional family is en route but has yet to arrive -Myself, Dr. IcValeta Harmsnd pt RN again met with son at beside -Pt is actively dying. No further titration of pressors, no labs, no vent escalation -After thoughtful conversation, pt now DNR and comfort measures commencing.  -Liberal visitation. Family en route and we  can discuss liberation from vent and dc pressors upon arrival   ____________________________________ Goals of Care 1232/9/51Complicated critical illness course with progressive multisystem organ failure  -I (GrMonticelloP) discussed clinical course with the patient's son, JaCorene Cornean 12/5 at the bedside in presence of RN RyThurmond Buttsdiscussed pts worsening shock, hypoxia, inability to tolerate volume removal on CRRT and poor neurologic exam despite no sedation. -I plainly stated that I am concerned that despite ongoing and escalating supportive measures that the patient continues to decline and is dying. Son endorses understanding of this.  -We discussed Code status. I shared my concerns that in the event of cardiac arrest, CPR is unlikely to benefit and will likely cause harm. Son endorses understanding and expresses desire for DNR. Together we called family. Family wishes to come to hospital prior to entering code status change, and will likely transition to comfort care at that time -Remains full code at present while we await family's arrival. High risk of cardiac arrest in interim.  -I do not think there is any utility in further lab draws or imaging with the patient's imminent demise. We will  Not continue to draw  Acute encephalopathy. Currently off sedation for over since 11/30. Minimal PRN use. Consider toxic metabolic especially given progressive renal failure.  Clinical exam concerning for possible ischemic, hypoxic brain injury given CPR 11/29.  P -Too unstable  to go for neuro imaging  - I think family has understanding of dismal prognosis -- if would help establish GOC, could consider EEG but do not think this is needed at present.   Mixed Shock, Septic + ABLA / Hypovolemic, vasoplegic shock RLL PNA, Intra-Abdominal Abscess.   Recurrent on 11/29 due to acute increased retroperitoneal bleeding and also upper GI bleeding.  Resulting cardiac arrest, CPR 20 minutes.  Initial shock multifactorial,  ABLA and septic. S/p 2 units blood on 11/21.  Klebsiella Empyema + fluid collection in abd. Recurrent episode 11/29 appears to be more likely due to acute blood loss Suspected aspiration and intraabdominal abscess (klebsiella and candida) in setting of bowel perforation, left empyema.  No clear evidence for connection between the intra-abdominal process and pleural space P -Continue diflucan (started 11/17), zosyn (started 11/19),  duration difficult to determine given perforation as source -escalating pressor requirement. Do not think there is utility at ths time in addition additional pressors nor for increasing the range of pressor titration. Pt is on 58mg NE, 0.03 vaso and 200 neo.  -MAP goal > 65   Acute respiratory failure secondary to aspiration pneumonia with bilateral pleural effusion, left klebsiella empyema, ? Connection with abdominal process Hemothorax following thoracentesis, s/p VATS 11/22. No active bleeding after clot removal. Recurrent acute respiratory failure in setting cardiac arrest, acidosis, retroperitoneal bleed S/p thora on 11/20 with exudative properties, klebsiella in pleural culture & RP abscess.  s/post thora extravasation on CTA chest. Cardiac arrest 11/29, reintubated in setting of RP bleed. P -PEEP and FiO2 for SpO2 > 90 -escalating vent settings  -VAP, pulm hygiene   Intra-abdominal/retroperitoneal abscess with Contained Bowel Perforation.   Hx of PUD, Partial Gastrectomy No clear persistent connection between duodenum and abscess based on CT 11/29 -Appreciate CCS, IR assistance.  No role for surgery at this point -Antibiotics, antifungals as above  Retroperitoneal Bleed / Hematoma  4.9x19.9x5.4 cm, appears source of bleeding related to drain and RP collection, not from gastric or duodenal source Bleeding from insertion sites of arterial line and central access Thrombocytopenia Elevated DDimer -1 plt 1 PRBC overnight  -Ddimer > 20,000 + schisc. Very mild  coagulopathy. This is reflective of ongoing devastating critical illness with MODS  P -will not further trend CBC -- pt is actively dying and further transfusions will not change outcome  AKI requiring RRT  Progressive following arrest 11/29 P -Time limited trial of CRRT after multidisciplinary and family discussion 12/3 to end 12/6 -I do not think pt will survive 12/5. He is not tolerating CRRT and if/when CRRT clots off it will not be resumed. D/w Drs IValeta Harmsand Colandonato   PAF with RVR Currently rate controlled. Heparin infusion stopped 11/29 with acute RP bleed. S/P protamine -Continue to hold anticoagulation -Amio resumed 12/5. Previously dc due to intermittent pauses.   Acute R MCA CVA s/p thrombectomy and right MCA stent placement by IR with TICI 3 Left hemiplegia Dysphagia -continue hold ASA, heparin with bleeding 11/29  -supportive care  -PT efforts when able   Severe Protein Calorie Malnutrition -Pt is actively dying. No further TPN     Best practice:  Diet: bowel rest/ NPO.   Pain/Anxiety/Delirium protocol (if indicated): none VAP protocol (if indicated): Oral care protocol DVT prophylaxis: SCDs for now GI prophylaxis: PPI BID Glucose control: SSI Mobility: bed rest Code Status: full code Family Communication: Discussed with son JCorene Corneaat bedside 12/5. Please see "GOC" plan above  Disposition:  ICU  LABS  PULMONARY Recent Labs  Lab 10/12/2020 1737 10/17/2020 2055 10/31/20 1211 11/03/20 2255 2020-11-13 0515  PHART 7.198* 7.332* 7.539* 7.394 7.247*  PCO2ART 43.7 25.8* 24.7* 31.5* 36.1  PO2ART 400* 170* 70* 53* 65*  HCO3 17.0* 13.4* 21.1 19.2* 15.8*  TCO2 18*  --  22 20* 17*  O2SAT 100.0 98.8 96.0 87.0 90.0   CBC Recent Labs  Lab 11/02/20 2252 11/02/20 2252 11/03/20 2110 11/03/20 2110 11/03/20 2255 11/13/2020 0014 11/13/20 0343 Nov 13, 2020 0515  HGB 8.7*   < > 8.4*   < > 7.8*  --  8.8* 9.2*  HCT 24.6*   < > 25.2*   < > 23.0*  --  24.2* 27.0*  WBC  12.7*  --  26.9*  --   --   --  36.6*  --   PLT 69*   < > 66*  --   --  60* 69*  --    < > = values in this interval not displayed.   COAGULATION Recent Labs  Lab 10/17/2020 2100 11/02/20 2252 11-13-20 0014  INR 2.7* 1.9* 1.5*   CARDIAC  No results for input(s): TROPONINI in the last 168 hours. No results for input(s): PROBNP in the last 168 hours.  CHEMISTRY Recent Labs  Lab 11/01/20 0800 11/01/20 0800 11/02/20 0421 11/02/20 0421 11/02/20 1546 11/02/20 1546 11/03/20 0317 11/03/20 0317 11/03/20 0756 11/03/20 0756 11/03/20 1530 11/03/20 1530 11/03/20 2255 11/03/20 2255 Nov 13, 2020 0342 2020-11-13 0343 11-13-2020 0515  NA 142   < > 139   < > 139   < > 135   < > 138  --  137  --  136  --  131*  --  134*  K 4.0   < > 3.6   < > 3.3*   < > 3.7   < > 3.6   < > 3.7   < > 3.6   < > 3.7  --  3.5  CL 93*   < > 90*   < > 91*  --  92*  --  95*  --  99  --   --   --  97*  --   --   CO2 18*   < > 19*   < > 21*  --  20*  --  22  --  22  --   --   --  16*  --   --   GLUCOSE 123*   < > 130*   < > 130*  --  490*  --  158*  --  136*  --   --   --  497*  --   --   BUN 105*   < > 123*   < > 123*  --  70*  --  61*  --  51*  --   --   --  32*  --   --   CREATININE 5.03*   < > 5.51*   < > 5.41*  --  2.92*  --  2.68*  --  2.20*  --   --   --  1.45*  --   --   CALCIUM 6.5*   < > 7.1*   < > 7.1*  --  7.8*  --  7.5*  --  7.4*  --   --   --  8.0*  --   --   MG 2.4  --  2.3  --   --   --  2.8*  --  2.4  --   --   --   --   --   --  2.7*  --   PHOS 6.7*   < > 5.6*   < > 4.5  --  2.8  --  2.3*  --  1.8*  --   --   --  2.0*  --   --    < > = values in this interval not displayed.   Estimated Creatinine Clearance: 47.5 mL/min (A) (by C-G formula based on SCr of 1.45 mg/dL (H)).  LIVER Recent Labs  Lab 10/28/2020 0529 10/12/2020 0529 10/08/2020 2100 10/31/20 0231 10/31/20 0231 11/01/20 0800 11/01/20 0800 11/02/20 1546 11/02/20 2252 11/03/20 0317 11/03/20 0756 11/03/20 1530 November 06, 2020 0014 Nov 06, 2020 0342   AST 89*  --   --  2,275*  --  3,711*  --   --   --   --  985*  --   --   --   ALT 124*  --   --  1,447*  --  3,092*  --   --   --   --  2,184*  --   --   --   ALKPHOS 206*  --   --  185*  --  208*  --   --   --   --  235*  --   --   --   BILITOT 1.3*  --   --  5.8*  --  5.4*  --   --   --   --  6.0*  --   --   --   PROT 5.9*  --   --  4.9*  --  4.2*  --   --   --   --  4.3*  --   --   --   ALBUMIN 1.4*   < >  --  1.6*   < > 1.2*   < > <1.0*  --  <1.0* 1.1* 1.1*  --  1.3*  INR  --   --  2.7*  --   --   --   --   --  1.9*  --   --   --  1.5*  --    < > = values in this interval not displayed.   INFECTIOUS Recent Labs  Lab 10/30/20 0948 10/31/20 0237 10/31/20 1158  LATICACIDVEN >11.0* 8.2* 10.8*   ENDOCRINE CBG (last 3)  Recent Labs    11/03/20 2344 11-06-2020 0404 11/06/20 0410  GLUCAP 117* 447* 121*    CRITICAL CARE Performed by: Cristal Generous   Total critical care time: 58 minutes  Critical care time was exclusive of separately billable procedures and treating other patients.  Critical care was necessary to treat or prevent imminent or life-threatening deterioration.  Critical care was time spent personally by me on the following activities: development of treatment plan with patient and/or surrogate as well as nursing, discussions with consultants, evaluation of patient's response to treatment, examination of patient, obtaining history from patient or surrogate, ordering and performing treatments and interventions, ordering and review of laboratory studies, ordering and review of radiographic studies, pulse oximetry and re-evaluation of patient's condition.   Pulmonary critical care attending:  This is a 65 year old gentleman with complicated hospital course following a MCA stroke.  Please see details of course as outlined above.  At this point the patient is in refractory vasoplegic shock with multiorgan failure on multiple vasopressors, likely DIC and bleeding from  most IV sites.  Had a long discussion with the patient's family regarding next best steps and recommended change of CODE STATUS to DNR.  They are waiting for family members to come to the hospital.  They understand that his death is likely imminent as we are maxed on 3 vasopressors and only able to maintain a mean arterial pressure in the 40s.  BP 116/84   Pulse 99   Temp (!) 93.4 F (34.1 C) (Axillary)   Resp (!) 30   Wt 73.1 kg   SpO2 90%   BMI 25.24 kg/m   General: Elderly debilitated male, ill-appearing, intubated on mechanical life support Frail appearing ENT: Endotracheal tube in place Heart: Tachycardic, regular Lungs: Bilateral coagulated breath sounds Extremities: Thin  Labs: Reviewed  Assessment: Multiorgan failure Refractory vasoplegic shock Hemorrhagic shock, septic shock, Acute renal failure requiring CVVHD Klebsiella aspiration pneumonia, empyema Hemothorax, resolved status post VATS Intra-abdominal retroperitoneal abscess Atrial fibrillation with RVR 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 Severe protein calorie malnutrition requiring TPN  Plan: We discussed his multiple medical comorbidities, his current vasopressor dose requirements that are above guardrail to maintain mean arterial pressure. Family understands his poor prognosis. Decision made to transition to a DNR. Would like to maintain current level of care until family is able to be present. I did explain that I thought even despite aggressive measures he would likely pass within the coming hours. We will continue CVVHD until we are unable to tolerate from a pressure standpoint. This was discussed with nephrology. If the filter clots we will also stop CVVHD.  Hopefully once we are able to gather family together we can make a decision for transition to comfort care measures.  We thought that this was going to occur yesterday but it did not.  This patient is critically ill with multiple organ  system failure; which, requires frequent high complexity decision making, assessment, support, evaluation, and titration of therapies. This was completed through the application of advanced monitoring technologies and extensive interpretation of multiple databases. During this encounter critical care time was devoted to patient care services described in this note for 32 minutes.  Garner Nash, DO [facility] 11-14-20 2:43 PM

## 2020-12-01 NOTE — Death Summary Note (Addendum)
DEATH SUMMARY   Patient Details  Name: Mark May MRN: [id number] DOB: December 14, 1955  Admission/Discharge Information   Admit Date:  October 14, 2020  Date of Death:  11-13-2020  Time of Death:  16:57  Length of Stay: 30  Referring Physician: Reubin Milan, MD   Reason(s) for Hospitalization  Stroke: Right MCA CVA  Diagnoses  Preliminary cause of death: Septic Shock  Secondary Diagnoses (including complications and co-morbidities):  Principal Problem:   Middle cerebral artery embolism, right Active Problems:   Benign hypertension   Benign prostatic hyperplasia with urinary frequency   Mild hyperlipidemia   Atrial fibrillation, new onset (HCC)   Severe sepsis (HCC)   AKI (acute kidney injury) (HCC)   Protein-calorie malnutrition, severe   Acute respiratory distress   Cardiac arrest, cause unspecified (HCC)   GI bleed Septic Shock Hemorrhagic shock ABLA Retroperitoneal bleed Hypoxic respiratory failure L hemiplegia Dysphagia RLL Pneumonia Pleural effusion with Klebsiella pneumoniae Pulmonary edema Hemothorax Bowel perforation  Intraabdominal abscess with Klebsiella pneumoniae Metabolic acidosis  Acute encephalopathy, multifactorial Protein calorie malnutrition Coagulopathy  Thrombocytopenia   Brief Hospital Course (including significant findings, care, treatment, and services provided and events leading to death)  Mark May is a 65 y.o. year old male who was admitted 10/14/20 with left sided weakness, found to have R MCA occlusion. Underwent stent placement and thrombectomy October 14, 2020. Hospital course complicated by hypoxic respiratory failure due to pulmonary edema requiring BiPAP and diuresis, left hemiplegia, new onset Afib RVR, dysphagia, Right sided Pneumonia, Septic shock requiring antibiotics, intraabdominal abscess requiring CT guided drain, GI Bleed, ABLA requiring transfusion, Bowel perforation, acute respiratory failure requiring intubation, pleural effusion  requiring thoracentesis, hemothorax requiring VATS, Spontaneous Retroperitoneal bleed, Hemorrhagic shock requiring transfusion, cardiac arrest with ROSC, Multifactorial acute encephalopathy, Coagulopathy, Thrombocytopenia requiring transfusion, AKI requiring CRRT. Neurology, Critical Care, Urology, Gastroenterology, General Surgery, Cardiothoracic surgery and Nephrology, in collaborated to provide ongoing aggressive care.  On 2020-11-13 the patient exhibited progressive decline-- with increasing pressor requirement, increasing Oxygen requirements on mechanical ventilation, and requiring plt and PRBC transfusions due to ongoing bleeding for arterial and venous access sites. A decision was reached to update code status to DNR, and subsequently to pursue comfort measures. The patient died 13-Nov-2020 at 1657.   Pertinent Labs and Studies  Significant Diagnostic Studies CT ABDOMEN PELVIS WO CONTRAST  Addendum Date: 10/30/2020   ADDENDUM REPORT: 10/30/2020 02:04 ADDENDUM: Critical Value/emergent results were called by telephone at the time of interpretation on 10/30/2020 at 2:03 am to Dr. Violet Baldy, Who verbally acknowledged these results. Electronically Signed   By: Deatra Robinson M.D.   On: 10/30/2020 02:04   Result Date: 10/30/2020 CLINICAL DATA:  Chest and abdominal pain.  Cardiopulmonary arrest. EXAM: CT CHEST, ABDOMEN AND PELVIS WITHOUT CONTRAST TECHNIQUE: Multidetector CT imaging of the chest, abdomen and pelvis was performed following the standard protocol without IV contrast. COMPARISON:  None. FINDINGS: CT CHEST FINDINGS Cardiovascular: Calcific aortic atherosclerosis. Normal heart size. No pericardial effusion. Mediastinum/Nodes: No adenopathy. Esophageal catheter terminates in the stomach. Normal thyroid. Lungs/Pleura: Left-greater-than-right pleural effusions. There is a left-sided chest tube terminates near the left apex. Hazy opacities in the right upper lobe. Bibasilar atelectasis. Endotracheal tube  tip is at the level of the clavicular heads. Musculoskeletal: No chest wall mass or suspicious bone lesions identified. CT ABDOMEN PELVIS FINDINGS Hepatobiliary: Unremarkable liver. Cholelithiasis without other gallbladder abnormality. Pancreas: Unremarkable. No pancreatic ductal dilatation or surrounding inflammatory changes. Spleen: Normal in size without focal abnormality. Adrenals/Urinary Tract: Normal adrenal glands. Multiple  3-4 mm calcifications at the right renal hilum. Stomach/Bowel: There is enteric contrast within distal small bowel and throughout the colon. There is no extravasated contrast visible. There is a posterior approach percutaneous right lower quadrant brain within an abscess cavity that is decreased in size. The abscess cavity is directly adjacent to the distal second portion of the duodenum. Vascular/Lymphatic: Aortic atherosclerosis. No enlarged abdominal or pelvic lymph nodes. Reproductive: Prostate is unremarkable. Other: There is a right retroperitoneal hematoma along the course of the drainage catheter. Dimensions are approximately 4.9 x 19.9 x 5.4 cm. There is intramuscular hematoma of the right psoas and iliacus muscles. Musculoskeletal: No acute or significant osseous findings. IMPRESSION: 1. Right lower quadrant percutaneous drainage catheter with decreased size of the abscess cavity. The abscess cavity is directly adjacent to the distal second portion of the duodenum. No extravasated contrast. 2. Right retroperitoneal hematoma along the course of the drainage catheter, measuring approximately 4.9 x 19.9 x 5.4 cm in involving the right psoas and iliacus muscles. 3. Left-greater-than-right pleural effusions with left-sided chest tube terminating near the left apex. Left-sided extrapleural collection has decreased in size. Aortic Atherosclerosis (ICD10-I70.0). Electronically Signed: By: Deatra Robinson M.D. On: 10/30/2020 01:53   CT ABDOMEN PELVIS WO CONTRAST  Result Date:  10/21/2020 CLINICAL DATA:  Abdominal pain and abdominal distension. EXAM: CT ABDOMEN AND PELVIS WITHOUT CONTRAST TECHNIQUE: Multidetector CT imaging of the abdomen and pelvis was performed following the standard protocol without IV contrast. COMPARISON:  October 16, 2020 FINDINGS: Lower chest: Moderate severity areas of consolidation are seen within the bilateral lung bases. There is a large left pleural effusion with suspected loculated components. This is increased in size when compared to the prior study. A small to moderate sized right pleural effusion is also noted. Hepatobiliary: No focal liver abnormality is seen. Numerous subcentimeter gallstones are seen within the lumen of a normal appearing gallbladder. There is no evidence of biliary dilatation. Pancreas: Unremarkable. No pancreatic ductal dilatation or surrounding inflammatory changes. Spleen: The spleen is displaced within the anteromedial aspect of the left upper quadrant the. A 15.5 cm x 11.3 cm x 16.9 cm area containing a mixture of hemorrhagic and non hemorrhagic fluid is seen within the left upper quadrant. This appears to be retroperitoneal in location and represents a new finding when compared to the prior exam. Adrenals/Urinary Tract: Adrenal glands are unremarkable. Kidneys are normal in size. Multiple stable bilateral renal cysts of various sizes are seen. Multiple 2 mm, 3 mm and 4 mm nonobstructing renal stones are seen throughout the right kidney. A mild amount of air is again seen within the lumen of the urinary bladder. Stomach/Bowel: A nasogastric tube is seen with its distal tip noted within the body of the stomach. The appendix is not clearly identified. No evidence of bowel dilatation. A 2.1 cm x 1.3 cm area of oral contrast is seen adjacent to the posteromedial aspect of the cecum (axial CT image 69, CT series number 3). This is present on the prior study. Vascular/Lymphatic: There is moderate severity calcification of the  abdominal aorta and bilateral common iliac arteries, without evidence of aneurysmal dilatation. No enlarged abdominal or pelvic lymph nodes. Reproductive: Prostate is unremarkable. Other: A percutaneous drainage catheter is again seen entering via the right flank. Its distal tip is noted along the posterior aspect of the right lower quadrant and is unchanged in position when compared to the prior study. Numerous foci of moderate severity contained free air are again seen within this  region. Persistent surrounding mesenteric inflammatory fat stranding and mesenteric fluid is also noted. A small amount of posterior pelvic free fluid is seen. This is stable in appearance when compared to the prior study. Musculoskeletal: A moderate to marked amount of para muscular subcutaneous inflammatory fat stranding is seen along the lateral aspects of the left abdominal and pelvic walls. No acute or significant osseous findings. IMPRESSION: 1. Very large area of intra-abdominal fluid and blood within the left upper quadrant, with an appearance worrisome for active bleeding. 2. Large left pleural effusion with a small to moderate sized right pleural effusion. 3. Moderate severity bibasilar consolidation which may represent atelectasis and/or pneumonia. 4. Cholelithiasis. 5. Multiple stable bilateral renal cysts of various sizes. 6. Stable position of the right-sided percutaneous drainage catheter with a moderate amount of surrounding contained free air, fluid and inflammatory fat stranding. Fistulous communication with the adjacent portion of large bowel cannot be excluded. 7. Moderate to marked amount of para muscular subcutaneous inflammatory fat stranding along the lateral aspects of the left abdominal and pelvic walls. 8. Moderate to marked amount of para muscular. 9. Aortic atherosclerosis. Aortic Atherosclerosis (ICD10-I70.0). Electronically Signed   By: Aram Candela M.D.   On: 10/21/2020 01:53   CT ABDOMEN PELVIS WO  CONTRAST  Result Date: 10/16/2020 CLINICAL DATA:  Peritonitis or perforation suspected. EXAM: CT ABDOMEN AND PELVIS WITHOUT CONTRAST TECHNIQUE: Multidetector CT imaging of the abdomen and pelvis was performed following the standard protocol without IV contrast. COMPARISON:  10/13/2020 FINDINGS: Lower chest: Moderate bilateral pleural effusions with bilateral lower lobe atelectasis or consolidation, similar to prior study. Heart is normal size. Hepatobiliary: No focal hepatic abnormality. Gallbladder contracted and filled with stones. Pancreas: No focal abnormality or ductal dilatation. Spleen: No focal abnormality.  Normal size. Adrenals/Urinary Tract: Gas seen within the urinary bladder, stable since prior study. Several small nonobstructing right renal stones. No hydronephrosis or ureteral stones. Adrenal glands unremarkable. Bilateral exophytic renal cysts noted. There is a drainage catheter now in place within the previously seen perinephric gas and fluid collection. The previously seen fluid collection has decreased in overall size. Gas and contrast material is seen within the perinephric space. Contrast also appears to extend into the right paracolic gutter. This is concerning for possible communication with the right colon. Stomach/Bowel: Mild wall thickening and irregularity along the posterior wall of the ascending colon adjacent to the contrast seen in the right perinephric space and paracolic gutter. No evidence of bowel obstruction. Stomach and small bowel decompressed. NG tube tip is in the stomach. Vascular/Lymphatic: Aortic atherosclerosis. No evidence of aneurysm or adenopathy. Reproductive: No visible focal abnormality. Other: Small to moderate free fluid in the pelvis. Musculoskeletal: No acute bony abnormality. IMPRESSION: Interval placement of drainage catheter into the right perinephric space in the area of previously seen gas and fluid collection. Fluid collection has decreased since prior  study. There is now contrast material seen within the perinephric space and extending into the right paracolic gutter. This is concerning for possible fistulous communication to the colon. May consider contrast injection through the drainage catheter under fluoroscopic guidance to assess for enteric fistula. Moderate bilateral pleural effusions with bibasilar atelectasis or consolidation, unchanged. Cholelithiasis. NG tube in the stomach. Aortic atherosclerosis. Small to moderate free fluid in the pelvis. Electronically Signed   By: Charlett Nose M.D.   On: 10/16/2020 17:21   CT ABDOMEN PELVIS WO CONTRAST  Result Date: 10/13/2020 CLINICAL DATA:  65 year old male with abdominal abscess or infection. EXAM: CT ABDOMEN  AND PELVIS WITHOUT CONTRAST TECHNIQUE: Multidetector CT imaging of the abdomen and pelvis was performed following the standard protocol without IV contrast. COMPARISON:  Renal ultrasound dated 10/11/2020. FINDINGS: Evaluation of this exam is limited in the absence of intravenous contrast. Lower chest: Partially visualized small bilateral pleural effusions with complete consolidative changes of the visualized lower lobes. No intra-abdominal free air. There is a small free fluid in the pelvis. Hepatobiliary: The liver is unremarkable. No intrahepatic biliary dilatation. There are multiple stones in the gallbladder. No pericholecystic fluid or evidence of acute cholecystitis by CT. Pancreas: Unremarkable. No pancreatic ductal dilatation or surrounding inflammatory changes. Spleen: Normal in size without focal abnormality. Adrenals/Urinary Tract: The adrenal glands unremarkable. Multiple nonobstructing right renal calculi measure up to 4 mm in the inferior pole of the right kidney. No hydronephrosis. There is no hydronephrosis or nephrolithiasis on the left. Multiple bilateral renal hypodense lesions are not characterized on this noncontrast CT. There is fluid collection with scattered pockets of air in  the right perinephric space and along the right psoas muscle extending down into the pelvis. The fluid collection measures 7.4 x 4.3 cm in greatest axial dimensions and 17 cm in craniocaudal length. There is air within the urinary bladder, possibly related to recent instrumentation. An infectious process is not excluded. Stomach/Bowel: A feeding tube is noted with tip appearing in the distal stomach. Evaluation however is limited due to respiratory motion. Contrast is noted in the colon. There is distal colonic diverticulosis without active inflammatory changes. No bowel obstruction. The appendix is not visualized with certainty. Vascular/Lymphatic: Moderate aortoiliac atherosclerotic disease. The IVC is unremarkable. No portal venous gas. There is no adenopathy. Reproductive: The prostate and seminal vesicles are grossly unremarkable. Other: There is mild diffuse subcutaneous edema. No fluid collection. Musculoskeletal: No acute or significant osseous findings. IMPRESSION: 1. Large right perinephric fluid collection and pockets of air concerning for an infectious process/abscess. Clinical correlation is recommended. 2. Cholelithiasis. 3. Nonobstructing right renal calculi. No hydronephrosis. 4. Colonic diverticulosis. No bowel obstruction. 5. Partially visualized small bilateral pleural effusions with complete consolidative changes of the visualized lower lobes. 6. Aortic Atherosclerosis (ICD10-I70.0). Electronically Signed   By: Elgie Collard M.D.   On: 10/13/2020 16:42   DG Chest 1 View  Result Date: 10/16/2020 CLINICAL DATA:  Tachycardia EXAM: CHEST  1 VIEW COMPARISON:  10/13/2020 FINDINGS: Unchanged left basilar opacities. Hazy diffuse opacities are also unchanged. Esophageal catheter courses below the field of view. Cardiac silhouette size is normal. IMPRESSION: Unchanged left basilar opacities and hazy diffuse opacities bilaterally. Electronically Signed   By: Deatra Robinson M.D.   On: 10/16/2020 01:15    DG Chest 1 View  Result Date: 10/09/2020 CLINICAL DATA:  Chest pain. EXAM: CHEST  1 VIEW COMPARISON:  None. FINDINGS: The heart size and mediastinal contours are within normal limits. Both lungs are clear. The visualized skeletal structures are unremarkable. Aortic calcifications are noted. IMPRESSION: No active disease. Electronically Signed   By: Katherine Mantle M.D.   On: 10/09/2020 19:52   DG Elbow 2 Views Left  Result Date: 10/07/2020 CLINICAL DATA:  Posterior left elbow swelling and decreased range of motion. Post stroke. No known injury. EXAM: LEFT ELBOW - 2 VIEW COMPARISON:  None. FINDINGS: Extended positioning on the lateral view precludes accurate assessment for joint effusion. No appreciable joint effusion. No fracture or dislocation. No focal osseous lesions. Small enthesophyte at the posterior olecranon. Soft tissue swelling overlying the olecranon. No significant arthropathy. No osseous erosions or periosteal  reaction. IMPRESSION: Soft tissue swelling overlying the olecranon, suggesting olecranon bursitis. Small enthesophyte at the posterior olecranon. No acute osseous abnormality. Electronically Signed   By: Delbert Phenix M.D.   On: 10/07/2020 16:09   DG Abd 1 View  Result Date: 10/15/2020 CLINICAL DATA:  Abdominal pain EXAM: ABDOMEN - 1 VIEW COMPARISON:  Abdomen radiograph October 13, 2020 and CT abdomen and pelvis October 13, 2020 FINDINGS: There is a drain in the right lower quadrant. Feeding tube tip is in the distal stomach region. There are foci of contrast in colon and appendix. There is no bowel dilatation or air-fluid level to suggest bowel obstruction. No free air is appreciable on supine examination. IMPRESSION: No bowel obstruction. No free air evident on supine examination. Tube and catheter positions as noted. Electronically Signed   By: Bretta Bang III M.D.   On: 10/15/2020 13:07   DG Abd 1 View  Result Date: 10/13/2020 CLINICAL DATA:  Abdominal distension  EXAM: ABDOMEN - 1 VIEW COMPARISON:  October 10, 2020 FINDINGS: Flocculated enteric contrast is seen within the colon. Moderate predominately RIGHT-sided colonic stool burden. Gaseous distension of loops of colon without dilation. Feeding tube tip terminates over the distal stomach. Probable nephrolithiasis. Visualized osseous structures are unremarkable. IMPRESSION: Feeding tube tip terminates over the distal stomach. Electronically Signed   By: Meda Klinefelter MD   On: 10/13/2020 14:30   DG Abd 1 View  Result Date: 10-10-2020 CLINICAL DATA:  Check gastric catheter placement EXAM: ABDOMEN - 1 VIEW COMPARISON:  None. FINDINGS: Gastric catheter is noted extending into the stomach. It appears coiled upon itself within the distal stomach with kinking at the proximal side port. This could be withdrawn slightly as clinically indicated. Contrast from recent arteriography is noted. IMPRESSION: Gastric catheter within the stomach kinked at the proximal side port. This could be withdrawn slightly S necessary. Electronically Signed   By: Alcide Clever M.D.   On: 10/10/20 20:21   CT HEAD WO CONTRAST  Result Date: October 10, 2020 CLINICAL DATA:  Stroke follow-up.  Right MCA recanalization today. EXAM: CT HEAD WITHOUT CONTRAST TECHNIQUE: Contiguous axial images were obtained from the base of the skull through the vertex without intravenous contrast. COMPARISON:  CT head approximately 4 hours prior to the current study. FINDINGS: Brain: Right lateral temporal lobe hypodensity unchanged in size. Decrease hyperdensity within this likely due to resolving contrast staining rather than hemorrhage. Small area of hypodensity in the right parietal cortex has developed since the prior study compatible with acute infarct. Ventricle size normal. No midline shift. Mild chronic microvascular ischemic change in the white matter. Vascular: Right MCA stent.  Negative for hyperdense vessel Skull: Negative Sinuses/Orbits: Mucosal edema in  the paranasal sinuses. The patient is intubated with an NG tube in place. Other: None IMPRESSION: Right temporal lobe hypodensity compatible with acute infarct. Decreased hyperdensity within this stroke compared with 4 hours previously compatible with contrast staining rather than hemorrhage. Small area of acute infarct in the right parietal lobe now evident. Right MCA stent. Electronically Signed   By: Marlan Palau M.D.   On: 2020-10-10 22:16   CT HEAD WO CONTRAST  Result Date: Oct 10, 2020 CLINICAL DATA:  Stroke.  Post intervention. EXAM: CT HEAD WITHOUT CONTRAST TECHNIQUE: Contiguous axial images were obtained from the base of the skull through the vertex without intravenous contrast. COMPARISON:  CT angio head and neck 10-10-20 FINDINGS: Brain: Hypodensity in the right lateral temporal lobe now shows mild hyperdensity which may be hemorrhage or contrast enhancement.  If this is blood, this is a mild amount of blood. This therefore likely was an area of acute infarct. There has been interval stenting of the right middle cerebral artery. No other areas of acute hemorrhage or infarct. Patchy white matter hypodensity bilaterally compatible chronic microvascular ischemia. Ventricle size normal. Vascular: Normal vascular enhancement following angiography. Skull: Negative Sinuses/Orbits: Mucosal edema paranasal sinuses with near complete opacification right maxillary sinus. Negative orbit Other: None IMPRESSION: Hypodensity in the right lateral temporal lobe now shows mild hemorrhage or contrast enhancement. This is most consistent with acute infarct. There has been interval stenting of the right middle cerebral artery. These results were called by telephone at the time of interpretation on 30-Oct-2020 at 6:28 pm to provider Bhagat, who verbally acknowledged these results. T Electronically Signed   By: Marlan Palau M.D.   On: 2020/10/30 18:26   CT CHEST WO CONTRAST  Addendum Date: 10/30/2020   ADDENDUM  REPORT: 10/30/2020 02:04 ADDENDUM: Critical Value/emergent results were called by telephone at the time of interpretation on 10/30/2020 at 2:03 am to Dr. Violet Baldy, Who verbally acknowledged these results. Electronically Signed   By: Deatra Robinson M.D.   On: 10/30/2020 02:04   Result Date: 10/30/2020 CLINICAL DATA:  Chest and abdominal pain.  Cardiopulmonary arrest. EXAM: CT CHEST, ABDOMEN AND PELVIS WITHOUT CONTRAST TECHNIQUE: Multidetector CT imaging of the chest, abdomen and pelvis was performed following the standard protocol without IV contrast. COMPARISON:  None. FINDINGS: CT CHEST FINDINGS Cardiovascular: Calcific aortic atherosclerosis. Normal heart size. No pericardial effusion. Mediastinum/Nodes: No adenopathy. Esophageal catheter terminates in the stomach. Normal thyroid. Lungs/Pleura: Left-greater-than-right pleural effusions. There is a left-sided chest tube terminates near the left apex. Hazy opacities in the right upper lobe. Bibasilar atelectasis. Endotracheal tube tip is at the level of the clavicular heads. Musculoskeletal: No chest wall mass or suspicious bone lesions identified. CT ABDOMEN PELVIS FINDINGS Hepatobiliary: Unremarkable liver. Cholelithiasis without other gallbladder abnormality. Pancreas: Unremarkable. No pancreatic ductal dilatation or surrounding inflammatory changes. Spleen: Normal in size without focal abnormality. Adrenals/Urinary Tract: Normal adrenal glands. Multiple 3-4 mm calcifications at the right renal hilum. Stomach/Bowel: There is enteric contrast within distal small bowel and throughout the colon. There is no extravasated contrast visible. There is a posterior approach percutaneous right lower quadrant brain within an abscess cavity that is decreased in size. The abscess cavity is directly adjacent to the distal second portion of the duodenum. Vascular/Lymphatic: Aortic atherosclerosis. No enlarged abdominal or pelvic lymph nodes. Reproductive: Prostate is  unremarkable. Other: There is a right retroperitoneal hematoma along the course of the drainage catheter. Dimensions are approximately 4.9 x 19.9 x 5.4 cm. There is intramuscular hematoma of the right psoas and iliacus muscles. Musculoskeletal: No acute or significant osseous findings. IMPRESSION: 1. Right lower quadrant percutaneous drainage catheter with decreased size of the abscess cavity. The abscess cavity is directly adjacent to the distal second portion of the duodenum. No extravasated contrast. 2. Right retroperitoneal hematoma along the course of the drainage catheter, measuring approximately 4.9 x 19.9 x 5.4 cm in involving the right psoas and iliacus muscles. 3. Left-greater-than-right pleural effusions with left-sided chest tube terminating near the left apex. Left-sided extrapleural collection has decreased in size. Aortic Atherosclerosis (ICD10-I70.0). Electronically Signed: By: Deatra Robinson M.D. On: 10/30/2020 01:53   CT CHEST W CONTRAST  Result Date: 10/21/2020 CLINICAL DATA:  Dropping hematocrit, large hemothorax following thoracentesis yesterday EXAM: CT CHEST, ABDOMEN, AND PELVIS WITH CONTRAST TECHNIQUE: Multidetector CT imaging of the chest, abdomen and pelvis  was performed following the standard protocol during bolus administration of intravenous contrast. CONTRAST:  OMNIPAQUE IOHEXOL 300 MG/ML  SOLN COMPARISON:  CT abdomen pelvis, 10/21/2020, 1:09 a.m. FINDINGS: CT CHEST FINDINGS Cardiovascular: Aortic atherosclerosis. Right upper extremity PICC, tip near the superior cavoatrial junction. Normal heart size. No pericardial effusion. Mediastinum/Nodes: No enlarged mediastinal, hilar, or axillary lymph nodes. Thyroid is normal. Endotracheal and esophagogastric intubation. Lungs/Pleura: Large left, moderate right pleural effusions and associated atelectasis or consolidation. There is extensive, heterogeneous high density in the inferior aspect of the left pleural effusion, consistent  with a large hematoma, given relatively circumscribed appearing appears to be extrapleural and measures approximately 15.3 x 12.1 x 8.5 cm (series 6, image 41). There appears to be a small focus of contrast extravasation adjacent to the inferior margin of the posterolateral left tenth rib (series 6, image 64). Musculoskeletal: No chest wall mass or suspicious bone lesions identified. CT ABDOMEN PELVIS FINDINGS Hepatobiliary: No solid liver abnormality is seen. Gallstones and sludge in the gallbladder. No gallbladder wall thickening, or biliary dilatation. Pancreas: Unremarkable. No pancreatic ductal dilatation or surrounding inflammatory changes. Spleen: Normal in size without significant abnormality. Adrenals/Urinary Tract: Adrenal glands are unremarkable. Multiple nonobstructive right renal calculi. No left-sided calculi, ureteral calculi, or hydronephrosis. Small air-fluid level within the urinary bladder, similar to prior. Stomach/Bowel: Stomach is within normal limits. Appendix is not clearly visualized. No evidence of bowel wall thickening, distention, or inflammatory changes. Vascular/Lymphatic: Aortic atherosclerosis. No enlarged abdominal or pelvic lymph nodes. Reproductive: No mass or other abnormality. Other: Anasarca. Redemonstrated percutaneous pigtail drainage catheter positioned in a right retroperitoneal air and fluid collection, not significantly changed compared to prior examination (series 3, image 79). Small volume fluid attenuation ascites in the low pelvis (series 3, image 105). Musculoskeletal: No acute or significant osseous findings. IMPRESSION: 1. There is a large left pleural effusion, which contains a circumscribed appearing region of heterogeneous high density inferiorly measuring approximately 15.3 x 12.1 x 8.5 cm. This is most consistent with a large extrapleural hematoma and there appears to be a small focus of contrast extravasation adjacent to the inferior margin of the  posterolateral left tenth rib, most consistent with an intercostal artery extravasation following thoracentesis. This is not significantly changed compared to prior same day noncontrast examination. 2. Moderate right pleural effusion. 3. Redemonstrated percutaneous pigtail drainage catheter positioned in a right retroperitoneal air and fluid collection, not significantly changed compared to prior examination. 4. Small volume fluid attenuation ascites in the low pelvis. 5. Anasarca. 6. Other chronic and incidental findings as above. Aortic Atherosclerosis (ICD10-I70.0). Findings discussed by telephone with Dr. Barton Fanny at 1:15 p.m., 10/21/2020. Electronically Signed   By: Lauralyn Primes M.D.   On: 10/21/2020 14:01   MR ANGIO HEAD WO CONTRAST  Result Date: 10/06/2020 CLINICAL DATA:  Stroke, follow-up. EXAM: MRI HEAD WITHOUT CONTRAST MRA HEAD WITHOUT CONTRAST TECHNIQUE: Multiplanar, multiecho pulse sequences of the brain and surrounding structures were obtained without intravenous contrast. Angiographic images of the head were obtained using MRA technique without contrast. COMPARISON:  Noncontrast head CT 10/11/2020. CT head 10/03/2020 performed at 6:03 p.m. CT angiogram head/neck performed 10/01/2020. FINDINGS: MRI HEAD FINDINGS Brain: Cerebral volume is normal for age. There is fairly extensive patchy multifocal cortical and subcortical restricted diffusion within the right MCA and watershed territories consistent with acute/early subacute infarction. Acute infarcts are present within the right frontal, parietal, occipital and temporal lobes. Patchy small acute/early subacute infarcts are also present within the right basal ganglia. A subtle acute/early  subacute infarct is also present within the posteromedial right thalamus. Superimposed subacute/chronic infarction within the right temporal lobe with cortical laminar necrosis at this site. Additional small acute/early subacute infarcts within the left basal  ganglia. Corresponding T2/FLAIR hyperintensity at these sites. No significant mass effect. No midline shift Background moderate cerebral white matter chronic small vessel ischemic disease. Chronic right basal ganglia lacunar infarct. 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 No evidence of intracranial mass. No extra-axial fluid collection. No midline shift. Vascular: Reported below. Skull and upper cervical spine: No focal marrow lesion. Sinuses/Orbits: Visualized orbits show no acute finding. Complete T2 hyperintense opacification of the right maxillary sinus. Moderate ethmoid sinus mucosal thickening. Other: Left mastoid effusion. MRA HEAD FINDINGS The intracranial internal carotid arteries are patent. Interval stenting of the M1/M2 right middle cerebral artery with associated stent artifact. There is flow related signal proximal and distal to the stent suggestive of stent patency. Flow related signal within M2 and more distal right MCA branch vessels. The M1 left MCA is patent without stenosis. No left M2 proximal branch occlusion or high-grade proximal stenosis. 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 The intracranial vertebral arteries are patent. The basilar artery is patent. The posterior cerebral arteries are patent. Mild atherosclerotic narrowing of the proximal P2 left PCA (series 1060, image 19). IMPRESSION: MRI brain: 1. Fairly extensive patchy acute/early subacute infarcts within the right MCA/watershed territory. 2. Additional small acute/early subacute infarcts within the dorsal right thalamus and left basal ganglia. 3. Background moderate chronic small vessel ischemic disease. MRA head: 1. Interval stenting of the M1/M2 right middle cerebral artery. Flow related signal is present proximal and distal to the stent suggestive of stent patency. 2.  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 Electronically Signed   By: Jackey LogeKyle  Golden DO   On: 10/06/2020 13:43   MR BRAIN WO CONTRAST  Result Date: 10/06/2020 CLINICAL DATA:  Stroke, follow-up. EXAM: MRI HEAD WITHOUT CONTRAST MRA HEAD WITHOUT CONTRAST TECHNIQUE: Multiplanar, multiecho pulse sequences of the brain and surrounding structures were obtained without intravenous contrast. Angiographic images of the head were obtained using MRA technique without contrast. COMPARISON:  Noncontrast head CT 10/04/2020. CT head 10/10/2020 performed at 6:03 p.m. CT angiogram head/neck performed 10/07/2020. FINDINGS: MRI HEAD FINDINGS Brain: Cerebral volume is normal for age. There is fairly extensive patchy multifocal cortical and subcortical restricted diffusion within the right MCA and watershed territories consistent with acute/early subacute infarction. Acute infarcts are present within the right frontal, parietal, occipital and temporal lobes. Patchy small acute/early subacute infarcts are also present within the right basal ganglia. A subtle acute/early subacute infarct is also present within the posteromedial right thalamus. Superimposed subacute/chronic infarction within the right temporal lobe with cortical laminar necrosis at this site. Additional small acute/early subacute infarcts within the left basal ganglia. Corresponding T2/FLAIR hyperintensity at these sites. No significant mass effect. No midline shift Background moderate cerebral white matter chronic small vessel ischemic disease. Chronic right basal ganglia lacunar infarct. 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 No evidence of intracranial mass. No extra-axial fluid collection. No midline shift. Vascular: Reported below. Skull and upper cervical spine: No focal marrow lesion. Sinuses/Orbits: Visualized orbits  show no acute finding. Complete T2 hyperintense opacification of the right maxillary sinus. Moderate ethmoid sinus mucosal thickening. Other: Left mastoid effusion. MRA HEAD FINDINGS The intracranial internal  carotid arteries are patent. Interval stenting of the M1/M2 right middle cerebral artery with associated stent artifact. There is flow related signal proximal and distal to the stent suggestive of stent patency. Flow related signal within M2 and more distal right MCA branch vessels. The M1 left MCA is patent without stenosis. No left M2 proximal branch occlusion or high-grade proximal stenosis. 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 The intracranial vertebral arteries are patent. The basilar artery is patent. The posterior cerebral arteries are patent. Mild atherosclerotic narrowing of the proximal P2 left PCA (series 1060, image 19). IMPRESSION: MRI brain: 1. Fairly extensive patchy acute/early subacute infarcts within the right MCA/watershed territory. 2. Additional small acute/early subacute infarcts within the dorsal right thalamus and left basal ganglia. 3. Background moderate chronic small vessel ischemic disease. MRA head: 1. Interval stenting of the M1/M2 right middle cerebral artery. Flow related signal is present proximal and distal to the stent suggestive of stent patency. 2. 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 Electronically Signed   By: Jackey Loge DO   On: 10/06/2020 13:43   CT ABDOMEN PELVIS W CONTRAST  Result Date: 10/21/2020 CLINICAL DATA:  Dropping hematocrit, large hemothorax following thoracentesis yesterday EXAM: CT CHEST, ABDOMEN, AND PELVIS WITH CONTRAST TECHNIQUE: Multidetector CT imaging of the chest, abdomen and pelvis was performed following the standard protocol during bolus administration of intravenous contrast. CONTRAST:  OMNIPAQUE IOHEXOL 300 MG/ML  SOLN COMPARISON:  CT abdomen pelvis,  10/21/2020, 1:09 a.m. FINDINGS: CT CHEST FINDINGS Cardiovascular: Aortic atherosclerosis. Right upper extremity PICC, tip near the superior cavoatrial junction. Normal heart size. No pericardial effusion. Mediastinum/Nodes: No enlarged mediastinal, hilar, or axillary lymph nodes. Thyroid is normal. Endotracheal and esophagogastric intubation. Lungs/Pleura: Large left, moderate right pleural effusions and associated atelectasis or consolidation. There is extensive, heterogeneous high density in the inferior aspect of the left pleural effusion, consistent with a large hematoma, given relatively circumscribed appearing appears to be extrapleural and measures approximately 15.3 x 12.1 x 8.5 cm (series 6, image 41). There appears to be a small focus of contrast extravasation adjacent to the inferior margin of the posterolateral left tenth rib (series 6, image 64). Musculoskeletal: No chest wall mass or suspicious bone lesions identified. CT ABDOMEN PELVIS FINDINGS Hepatobiliary: No solid liver abnormality is seen. Gallstones and sludge in the gallbladder. No gallbladder wall thickening, or biliary dilatation. Pancreas: Unremarkable. No pancreatic ductal dilatation or surrounding inflammatory changes. Spleen: Normal in size without significant abnormality. Adrenals/Urinary Tract: Adrenal glands are unremarkable. Multiple nonobstructive right renal calculi. No left-sided calculi, ureteral calculi, or hydronephrosis. Small air-fluid level within the urinary bladder, similar to prior. Stomach/Bowel: Stomach is within normal limits. Appendix is not clearly visualized. No evidence of bowel wall thickening, distention, or inflammatory changes. Vascular/Lymphatic: Aortic atherosclerosis. No enlarged abdominal or pelvic lymph nodes. Reproductive: No mass or other abnormality. Other: Anasarca. Redemonstrated percutaneous pigtail drainage catheter positioned in a right retroperitoneal air and fluid collection, not significantly  changed compared to prior examination (series 3, image 79). Small volume fluid attenuation ascites in the low pelvis (series 3, image 105). Musculoskeletal: No acute or significant osseous findings. IMPRESSION: 1. There is a large left pleural effusion, which contains a circumscribed appearing region of heterogeneous high density inferiorly measuring approximately 15.3 x 12.1 x 8.5 cm. This is most consistent with a large extrapleural hematoma and there appears to be a small focus of contrast extravasation adjacent to the inferior margin of the posterolateral  left tenth rib, most consistent with an intercostal artery extravasation following thoracentesis. This is not significantly changed compared to prior same day noncontrast examination. 2. Moderate right pleural effusion. 3. Redemonstrated percutaneous pigtail drainage catheter positioned in a right retroperitoneal air and fluid collection, not significantly changed compared to prior examination. 4. Small volume fluid attenuation ascites in the low pelvis. 5. Anasarca. 6. Other chronic and incidental findings as above. Aortic Atherosclerosis (ICD10-I70.0). Findings discussed by telephone with Dr. Barton Fanny at 1:15 p.m., 10/21/2020. Electronically Signed   By: Lauralyn Primes M.D.   On: 10/21/2020 14:01   US RENAL  Result Date: 10/11/2020 CLINICAL DATA:  Acute renal insufficiency EXAM: RENAL / URINARY TRACT ULTRASOUND COMPLETE COMPARISON:  None. FINDINGS: Right Kidney: Renal measurements: 10.5 x 5.4 x 4.9 cm = volume: 145 mL. Echogenicity within normal limits. No mass or hydronephrosis visualized. 1.8 cm simple exophytic cortical cyst noted. Left Kidney: Renal measurements: 11.4 x 6.2 x 4.8 cm = volume: 176 mL. Echogenicity within normal limits. No mass or hydronephrosis visualized. 2.7 cm exophytic simple cortical cyst noted. Bladder: Decompressed and not well visualized. Other: None. IMPRESSION: Normal renal sonogram Electronically Signed   By: Helyn Numbers MD    On: 10/11/2020 21:01   IR Intra Cran Stent  Result Date: 10/17/2020 INDICATION: New onset of left-sided weakness, associated with facial weakness, and right gaze preference. CT of the brain demonstrating pre occlusive stenosis of the mid right M1 segment due to thrombus +/-intracranial arteriosclerosis. EXAM: 1. EMERGENT LARGE VESSEL OCCLUSION THROMBOLYSIS (anterior CIRCULATION) COMPARISON:  CT angiogram of head and neck of 11/30/2020. MEDICATIONS: The antibiotic was administered within 1 hour of the procedure ANESTHESIA/SEDATION: General anesthesia CONTRAST:  Isovue 300 approximately 125 mL. FLUOROSCOPY TIME:  Fluoroscopy Time: 39 minutes 0 seconds (1592 mGy). COMPLICATIONS: None immediate. TECHNIQUE: Following a full explanation of the procedure along with the potential associated complications, an informed witnessed consent was obtained. The risks of intracranial hemorrhage of 10%, worsening neurological deficit, ventilator dependency, death and inability to revascularize were all reviewed in detail with the patient's son. The patient was then put under general anesthesia by the Department of Anesthesiology at [facility]. The right groin was prepped and draped in the usual sterile fashion. Thereafter using modified Seldinger technique, transfemoral access into the right common femoral artery was obtained without difficulty. Over a 0.035 inch guidewire an 8 French 25 cm Pinnacle sheath was inserted. Through this, and also over a 0.035 inch guidewire a 5 [place] JB 1 catheter was advanced to the aortic arch region and selectively positioned in the right common carotid artery. FINDINGS: The right common carotid arteriogram demonstrates the right external carotid artery and its major branches to be widely patent. The right internal carotid artery at the bulb to the cranial skull base is also widely patent. The petrous, the cavernous and the supraclinoid segments are widely patent. The right  anterior cerebral artery opacifies into the capillary and venous phases. Prompt cross filling via the anterior communicating artery of the left anterior cerebral A2 segment is also seen. The right middle cerebral artery demonstrates a severe tapered near occlusive stenosis of the mid right A1 segment. More distally delayed opacification of the right middle cerebral trifurcation branches is seen into the capillary and venous phases. There is delayed hemodynamic flow through the right middle cerebral artery distribution. PROCEDURE: The diagnostic JB 1 catheter in the right common carotid artery was exchanged over a 0.035 inch 300 cm Rosen exchange guidewire for an  087 balloon guide catheter which had been prepped with 50% contrast and 50% heparinized saline infusion. The guidewire was removed. Good aspiration obtained from the hub of the balloon guide catheter. A gentle control arteriogram performed through this demonstrated no change in the extracranial or intracranial circulations. Over a 0.035 inch Roadrunner guidewire, a 6 French 132 cm Catalyst guide catheter was then advanced and positioned in the horizontal petrous segment of the right internal carotid artery. The guidewire was removed. Good aspiration was obtained from the hub of the 6 [place] guide sheath. A control arteriogram was then performed intracranially. 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 Over a 0.014 inch standard Synchro micro guidewire with a J-tip configuration, an 021 Trevo ProVue microcatheter was advanced to the supraclinoid right ICA. Using a torque device the guidewire was then advanced without difficulty through the high-grade stenosis into the inferior division of the right middle cerebral artery followed by the microcatheter. The guidewire was removed. Good aspiration obtained from the hub  of the microcatheter. A gentle control arteriogram performed through the microcatheter demonstrated safe position of the tip of the microcatheter. This was then connected to continuous heparinized saline infusion. A Tiger 21 retrieval device was then prepped and purged with heparinized saline infusion then advanced to the distal end of the microcatheter. The O ring on the delivery microcatheter was loosened. The Tiger 21 was then deployed by retrieving the delivery microcatheter slowly first delivering the distal micro guidewire and then the proximal retrieval device. The proximal end of the retrieval device was positioned at the proximal aspect of the occlusion. Proximal flow arrest was initiated in the right internal carotid artery. Thereafter, the diameter of the retrieval device was then increased and decreased for cycle. The microcatheter was advanced to pinch the proximal marker on the retrieval device. The aspiration catheter was then brought just proximal to the retrieval device. Constant aspiration was performed at the hub of the 6 [place] Catalyst guide catheter with a Penumbra aspiration pump for approximately a minute and half followed by the retrieval device, the microcatheter, and the 6 [place] Catalyst guide catheter which were locked. Pieces of clot were seen entangled in the retrieval device. Flow arrest was reversed. Control arteriogram performed through the balloon guide in the right internal carotid artery demonstrated complete revascularization of the right middle cerebral artery distribution achieving a TICI 3 revascularization. The right middle cerebral artery was now widely patent with a patency of 75% at the site of previous occlusion. Also seen was related plaque against the wall of the native vessel. A control arteriogram performed approximately 3 minutes later demonstrated progressive decrease in the caliber with slow flow in the right middle cerebral artery distribution. This progressed to  complete occlusion. This prompted the placement of a rescue stent. Measurements were performed of the right middle cerebral artery proximally and distally. An 021 microcatheter was then advanced inside of a Catalyst 6 [place] guide catheter over a 0.014 inch standard Synchro micro guidewire. The micro guidewire was then advanced without difficulty into the distal M2 M3 region of the inferior division followed by the microcatheter. The guidewire was removed. Good aspiration obtained from microcatheter. This was then connected to continuous heparinized saline infusion. A 4 mm x 24 mm Atlas Neuroform stent was then advanced to the distal end of the microcatheter. The proximal and the distal landing zone markers were  aligned in the appropriate position relative to the site of the severe stenosis. This was then deployed in the usual manner without any difficulty. A control arteriogram performed following the delivery of the stent demonstrated excellent apposition with significantly improved caliber and flow through the stented segment. There continued to be a mild to modest waist which remained stable over the next 20 minutes as per diagnostic arteriograms performed through the 6 [place] Catalyst guide catheter in the right internal carotid artery. A TICI 3 revascularization had been re-established. A 30 minute control arteriogram performed through the 6 [place] Catalyst guide catheter in the right internal carotid artery continued to demonstrate excellent flow through the stented segment of the right middle cerebral artery and the right anterior cerebral artery distributions. This was then retrieved and removed. A final control arteriogram performed through the balloon guide catheter in the right common carotid artery demonstrated wide patency of the right internal carotid artery proximally and distally. The balloon guide was removed. A short 8 French sheath was then positioned in the right common femoral puncture site and  connected to continuous heparinized saline infusion. Distal pulses remained Dopplerable in the dorsalis pedis, and the posterior tibial regions bilaterally. A CT of the brain performed on the table demonstrated no evidence of hemorrhage, mass effect or midline shift. Patient was left intubated and transferred to the ICU for post thrombectomy management. Patient was loaded with 81 mg of aspirin, and 180 mg of Brilinta via orogastric tube prior to the placement of the stent. Also the patient was given a loading dose of cangrelor followed by a 4 hour infusion with a CT to follow at the end. IMPRESSION: Status post endovascular complete revascularization of the near complete occlusion of the right middle cerebral M1 segment with 1 pass with the Tiger 21 retrieval device and aspiration achieving a TICI 3 revascularization, followed by placement of a rescue stent for reocclusion again achieving a TICI 3 revascularization. PLAN: Follow-up in the clinic 4 to 6 weeks post discharge. Electronically Signed   By: Julieanne Cotton M.D.   On: 10/08/2020 11:19   IR CT Head Ltd  Result Date: 10/17/2020 INDICATION: New onset of left-sided weakness, associated with facial weakness, and right gaze preference. CT of the brain demonstrating pre occlusive stenosis of the mid right M1 segment due to thrombus +/-intracranial arteriosclerosis. EXAM: 1. EMERGENT LARGE VESSEL OCCLUSION THROMBOLYSIS (anterior CIRCULATION) COMPARISON:  CT angiogram of head and neck of October 05, 2020. MEDICATIONS: The antibiotic was administered within 1 hour of the procedure ANESTHESIA/SEDATION: General anesthesia CONTRAST:  Isovue 300 approximately 125 mL. FLUOROSCOPY TIME:  Fluoroscopy Time: 39 minutes 0 seconds (1592 mGy). COMPLICATIONS: None immediate. TECHNIQUE: Following a full explanation of the procedure along with the potential associated complications, an informed witnessed consent was obtained. The risks of intracranial hemorrhage of 10%,  worsening neurological deficit, ventilator dependency, death and inability to revascularize were all reviewed in detail with the patient's son. The patient was then put under general anesthesia by the Department of Anesthesiology at [facility]. The right groin was prepped and draped in the usual sterile fashion. Thereafter using modified Seldinger technique, transfemoral access into the right common femoral artery was obtained without difficulty. Over a 0.035 inch guidewire an 8 French 25 cm Pinnacle sheath was inserted. Through this, and also over a 0.035 inch guidewire a 5 [place] JB 1 catheter was advanced to the aortic arch region and selectively positioned in the right common carotid artery. FINDINGS: The right common  carotid arteriogram demonstrates the right external carotid artery and its major branches to be widely patent. The right internal carotid artery at the bulb to the cranial skull base is also widely patent. The petrous, the cavernous and the supraclinoid segments are widely patent. The right anterior cerebral artery opacifies into the capillary and venous phases. Prompt cross filling via the anterior communicating artery of the left anterior cerebral A2 segment is also seen. The right middle cerebral artery demonstrates a severe tapered near occlusive stenosis of the mid right A1 segment. More distally delayed opacification of the right middle cerebral trifurcation branches is seen into the capillary and venous phases. There is delayed hemodynamic flow through the right middle cerebral artery distribution. PROCEDURE: The diagnostic JB 1 catheter in the right common carotid artery was exchanged over a 0.035 inch 300 cm Rosen exchange guidewire for an 087 balloon guide catheter which had been prepped with 50% contrast and 50% heparinized saline infusion. The guidewire was removed. Good aspiration obtained from the hub of the balloon guide catheter. A gentle control arteriogram performed  through this demonstrated no change in the extracranial or intracranial circulations. Over a 0.035 inch Roadrunner guidewire, a 6 French 132 cm Catalyst guide catheter was then advanced and positioned in the horizontal petrous segment of the right internal carotid artery. The guidewire was removed. Good aspiration was obtained from the hub of the 6 [place] guide sheath. A control arteriogram was then performed intracranially. 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 Over a 0.014 inch standard Synchro micro guidewire with a J-tip configuration, an 021 Trevo ProVue microcatheter was advanced to the supraclinoid right ICA. Using a torque device the guidewire was then advanced without difficulty through the high-grade stenosis into the inferior division of the right middle cerebral artery followed by the microcatheter. The guidewire was removed. Good aspiration obtained from the hub of the microcatheter. A gentle control arteriogram performed through the microcatheter demonstrated safe position of the tip of the microcatheter. This was then connected to continuous heparinized saline infusion. A Tiger 21 retrieval device was then prepped and purged with heparinized saline infusion then advanced to the distal end of the microcatheter. The O ring on the delivery microcatheter was loosened. The Tiger 21 was then deployed by retrieving the delivery microcatheter slowly first delivering the distal micro guidewire and then the proximal retrieval device. The proximal end of the retrieval device was positioned at the proximal aspect of the occlusion. Proximal flow arrest was initiated in the right internal carotid artery. Thereafter, the diameter of the retrieval device was then increased and decreased for cycle. The microcatheter was advanced to pinch the proximal marker on the  retrieval device. The aspiration catheter was then brought just proximal to the retrieval device. Constant aspiration was performed at the hub of the 6 [place] Catalyst guide catheter with a Penumbra aspiration pump for approximately a minute and half followed by the retrieval device, the microcatheter, and the 6 [place] Catalyst guide catheter which were locked. Pieces of clot were seen entangled in the retrieval device. Flow arrest was reversed. Control arteriogram performed through the balloon guide in the right internal carotid artery demonstrated complete revascularization of the right middle cerebral artery distribution achieving a TICI 3 revascularization. The right middle cerebral artery was now widely patent with a patency of 75% at the site of previous occlusion. Also seen was  related plaque against the wall of the native vessel. A control arteriogram performed approximately 3 minutes later demonstrated progressive decrease in the caliber with slow flow in the right middle cerebral artery distribution. This progressed to complete occlusion. This prompted the placement of a rescue stent. Measurements were performed of the right middle cerebral artery proximally and distally. An 021 microcatheter was then advanced inside of a Catalyst 6 [place] guide catheter over a 0.014 inch standard Synchro micro guidewire. The micro guidewire was then advanced without difficulty into the distal M2 M3 region of the inferior division followed by the microcatheter. The guidewire was removed. Good aspiration obtained from microcatheter. This was then connected to continuous heparinized saline infusion. A 4 mm x 24 mm Atlas Neuroform stent was then advanced to the distal end of the microcatheter. The proximal and the distal landing zone markers were aligned in the appropriate position relative to the site of the severe stenosis. This was then deployed in the usual manner without any difficulty. A control arteriogram performed  following the delivery of the stent demonstrated excellent apposition with significantly improved caliber and flow through the stented segment. There continued to be a mild to modest waist which remained stable over the next 20 minutes as per diagnostic arteriograms performed through the 6 [place] Catalyst guide catheter in the right internal carotid artery. A TICI 3 revascularization had been re-established. A 30 minute control arteriogram performed through the 6 [place] Catalyst guide catheter in the right internal carotid artery continued to demonstrate excellent flow through the stented segment of the right middle cerebral artery and the right anterior cerebral artery distributions. This was then retrieved and removed. A final control arteriogram performed through the balloon guide catheter in the right common carotid artery demonstrated wide patency of the right internal carotid artery proximally and distally. The balloon guide was removed. A short 8 French sheath was then positioned in the right common femoral puncture site and connected to continuous heparinized saline infusion. Distal pulses remained Dopplerable in the dorsalis pedis, and the posterior tibial regions bilaterally. A CT of the brain performed on the table demonstrated no evidence of hemorrhage, mass effect or midline shift. Patient was left intubated and transferred to the ICU for post thrombectomy management. Patient was loaded with 81 mg of aspirin, and 180 mg of Brilinta via orogastric tube prior to the placement of the stent. Also the patient was given a loading dose of cangrelor followed by a 4 hour infusion with a CT to follow at the end. 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 PLAN: Follow-up in the clinic 4 to 6 weeks post discharge. Electronically Signed   By: Julieanne Cotton M.D.   On: 10/08/2020 11:19   DG CHEST PORT 1 VIEW  Result Date: 11/02/2020 CLINICAL DATA:  Central line placement. EXAM: PORTABLE CHEST 1 VIEW COMPARISON:  Earlier same day FINDINGS: 1425 hours. Endotracheal tube tip is 4.1 cm above the base of the carina. Right IJ central line tip overlies the distal SVC level. Right PICC line tip is also position over the region of the distal SVC. Bibasilar collapse/consolidative opacity is stable with no substantial change bilateral pleural effusions. Telemetry leads overlie the chest. IMPRESSION: 1. Right IJ central line tip overlies the  distal SVC level. No pneumothorax. 2. Otherwise no substantial interval change. Electronically Signed   By: Kennith Center M.D.   On: 11/02/2020 14:59   DG Chest Port 1 View  Result Date: 11/02/2020 CLINICAL DATA:  Respiratory failure. EXAM: PORTABLE CHEST 1 VIEW COMPARISON:  11/01/2020. FINDINGS: Interim removal of NG tube and left chest tube. Endotracheal tube and right PICC line in unchanged position. PICC line tip noted over the right atrium. Heart size stable. Persistent atelectasis/infiltrate left lung base and small left pleural effusion. Persistent left-sided pleural thickening. Progressive diffuse right lung infiltrate/edema. Persistent right pleural effusion. No pneumothorax. IMPRESSION: 1. Interim removal of NG tube and left chest tube. No pneumothorax. 2. Persistent atelectasis/infiltrate left lung base and small left pleural effusion. Persistent left-sided pleural thickening. 3. Progressive diffuse right lung infiltrate/edema. Persistent right pleural effusion. Electronically Signed   By: Maisie Fus  Register   On: 11/02/2020 07:36   DG CHEST PORT 1 VIEW  Result Date: 11/01/2020 CLINICAL DATA:  Pleural effusion. EXAM: PORTABLE CHEST 1 VIEW COMPARISON:  Chest x-ray 10/31/2020.  Chest CT  10/30/2020. FINDINGS: Endotracheal tube, NG tube, right PICC line in stable position. Left chest tube in stable position. Heart size stable. Persistent atelectasis/infiltrate left lung base. Persistent small left pleural effusion. New layering right pleural effusion. Underlying right base atelectasis/infiltrate cannot be excluded. No pneumothorax. IMPRESSION: 1. Lines and tubes including left chest tube in stable position. No pneumothorax. 2. Persistent atelectasis/infiltrate left lung base. Persistent small left pleural effusion. 3. New layering right pleural effusion. Underlying right base atelectasis/infiltrate cannot be excluded. Electronically Signed   By: Maisie Fus  Register   On: 11/01/2020 06:10   DG CHEST PORT 1 VIEW  Result Date: 10/31/2020 CLINICAL DATA:  Pleural effusion EXAM: PORTABLE CHEST 1 VIEW COMPARISON:  10/30/2020 FINDINGS: Endotracheal tube is seen 4.7 cm above the carina. Nasogastric tube extends into the upper abdomen beyond the margin of the examination. Right upper extremity PICC line with its tip within the right atrium and left large bore chest tube at the left apex are all unchanged. Lung volumes are small. There is progressive retrocardiac opacification likely representing atelectasis or posteriorly layering pleural fluid in this location. There is stable lateral pleural thickening within the left hemithorax. There no pneumothorax. Minimal right basilar atelectasis. No pleural effusion on the right. Cardiac size within normal limits. Pulmonary vascularity is normal. IMPRESSION: Stable support lines and tubes. Stable left lateral pleural thickening. Progressive retrocardiac opacification likely related to retrocardiac atelectasis, posterior layering small pleural fluid, or a combination there of. Electronically Signed   By: Helyn Numbers MD   On: 10/31/2020 06:23   DG CHEST PORT 1 VIEW  Result Date: 10/30/2020 CLINICAL DATA:  Pneumothorax EXAM: PORTABLE CHEST 1 VIEW COMPARISON:   10/16/2020 FINDINGS: Endotracheal tube, nasogastric tube looped within the gastric fundus, right upper extremity PICC line with its tip within the superior right atrium, mediastinal drain, and large bore left chest tube extending to the apex are all unchanged. Lung volumes are small, however, pulmonary insufflation remain stable since prior examination. Small right pleural effusion is present. Left lateral pleural thickening is unchanged. Bibasilar atelectasis is seen. No pneumothorax. Cardiac size within normal limits. No acute bone abnormality. IMPRESSION: Left chest tube in place. No pneumothorax. Stable left pleural thickening. Small right pleural effusion. Stable support lines and tubes. Electronically Signed   By: Helyn Numbers MD   On: 10/30/2020 05:17   DG CHEST PORT 1 VIEW  Result Date: 10/03/2020 CLINICAL DATA:  Cardiac arrest  EXAM: PORTABLE CHEST 1 VIEW COMPARISON:  Same-day chest radiograph at 0529 hours. FINDINGS: Endotracheal tube with tip overlying the midthoracic trachea. Defibrillator leads overlie left chest/abdomen. Right upper extremity PICC with tip overlying the SVC. Left inferior approach thoracostomy tube with tip in the left lung apex, unchanged. The cardiomediastinal silhouette is partially obscured but appears unchanged. Similar size of the loculated left pleural effusion. Stable small right pleural effusion. Low lung volumes with hypoventilatory change in the dependent lungs. IMPRESSION: Endotracheal tube with tip overlying the midthoracic trachea. Stable size of the loculated left pleural effusion and position of the left chest tube. Stable small right pleural effusion and bibasilar atelectasis. Electronically Signed   By: Maudry Mayhew MD   On: 10/08/2020 16:50   DG CHEST PORT 1 VIEW  Result Date: 10/02/2020 CLINICAL DATA:  Empyema. EXAM: PORTABLE CHEST 1 VIEW COMPARISON:  October 27, 2020. FINDINGS: Stable cardiomediastinal silhouette. Left-sided chest tube is noted  without pneumothorax. Right-sided PICC line is unchanged. Stable loculated left pleural effusion is noted. Stable small right pleural effusion is noted. Mild bibasilar atelectasis is noted. Bony thorax is unremarkable. IMPRESSION: Left-sided chest tube is noted without pneumothorax. Stable loculated left pleural effusion is noted. Stable small right pleural effusion is noted. Mild bibasilar atelectasis is noted. Electronically Signed   By: Lupita Raider M.D.   On: 10/18/2020 08:25   DG CHEST PORT 1 VIEW  Result Date: 10/27/2020 CLINICAL DATA:  Follow-up left pneumothorax EXAM: PORTABLE CHEST 1 VIEW COMPARISON:  10/24/2020 FINDINGS: Interval removal of the enteric and endotracheal tubes. Right PICC line and left chest tube are unchanged in position. Persistent left lateral pleural effusion or thickening with basilar atelectasis or infiltration. No visible residual pneumothorax. Small right pleural effusion. IMPRESSION: Interval removal of endotracheal and enteric tubes. Otherwise no significant change. No visible residual pneumothorax. Electronically Signed   By: Burman Nieves M.D.   On: 10/27/2020 05:12   DG CHEST PORT 1 VIEW  Result Date: 10/24/2020 CLINICAL DATA:  Left hemothorax EXAM: PORTABLE CHEST 1 VIEW COMPARISON:  10/23/2020 FINDINGS: Endotracheal tube, enteric tube, left chest tube, and right PICC line are unchanged in position. Shallow inspiration. Mild cardiac enlargement. Bilateral perihilar infiltrates are similar. Small residual left pleural effusion with basilar atelectasis. No significant change since previous study. IMPRESSION: Appliances remain in satisfactory position. Persistent bilateral perihilar infiltrates and left pleural effusion with basilar atelectasis. Electronically Signed   By: Burman Nieves M.D.   On: 10/24/2020 04:28   DG CHEST PORT 1 VIEW  Result Date: 10/23/2020 CLINICAL DATA:  Respiratory failure. EXAM: PORTABLE CHEST 1 VIEW COMPARISON:  10/21/2020 CT  10/21/2020. FINDINGS: Interim placement of left chest tube. Significant reduction left-sided pleural fluid collection. No definite pneumothorax. Endotracheal tube, NG tube, right PICC line stable position. Rib right PICC line tip noted over the right atrium. Heart size stable. Low lung volumes with bibasilar atelectasis/infiltrates. Small right pleural effusion cannot be excluded. Mild left chest wall subcutaneous emphysema. IMPRESSION: 1. Interim placement of left chest tube. Significant reduction in left-sided pleural fluid collection. No definite pneumothorax. 2.  endotracheal tube, NG tube, right PICC line stable position. 3. Low lung volumes with bibasilar atelectasis/infiltrates. Small right pleural effusion cannot be excluded. Electronically Signed   By: Maisie Fus  Register   On: 10/23/2020 05:37   DG Chest Port 1 View  Result Date: 10/21/2020 CLINICAL DATA:  Respiratory failure EXAM: PORTABLE CHEST 1 VIEW COMPARISON:  None. FINDINGS: AE moderate to large left-sided pleural effusion with underlying opacity is  stable. The ETT is stable in good position. The OG tube terminates below today's film. No pneumothorax. No change in the cardiomediastinal silhouette. The right mid lung opacity seen previously has resolved. Mild opacity in the right base is probably atelectasis. The right PICC line terminates in the right atrium, unchanged. No other acute abnormalities. IMPRESSION: 1. Support apparatus as above. 2. Persistent moderate to large left effusion. 3. Resolution of right midlung opacity. 4. Mild opacity in the right base is probably atelectasis. Electronically Signed   By: Gerome Sam III M.D   On: 10/21/2020 09:37   DG Chest Port 1 View  Result Date: 10/20/2020 CLINICAL DATA:  Post thoracentesis EXAM: PORTABLE CHEST 1 VIEW COMPARISON:  Radiograph 10/20/2020 FINDINGS: Endotracheal tube tip terminates 2.5 cm from the carina. Transesophageal tube tip and side port distal to the GE junction. Right  upper extremity PICC terminates at the right atrium. Telemetry leads overlie the chest. Slight interval decrease in the size of a left pleural effusion with some lobular margins likely reflecting loculation. More coalescent retrocardiac opacity could reflect a combination of layering pleural fluid, atelectasis and/or airspace disease. Increasing coalescence of opacity seen in the right mid and lower lung with a trace right effusion as well. No visible pneumothorax. The aorta is calcified. The remaining cardiomediastinal contours are unremarkable. No acute osseous or soft tissue abnormality. Degenerative changes are present in the imaged spine and shoulders. IMPRESSION: 1. Slight interval decrease in size of a left pleural effusion with some lobular margins which could reflect loculation. 2. Slightly increased coalescent retrocardiac and right mid and lower lung opacities, could reflect a combination of layering pleural fluid, atelectasis and/or airspace disease. 3. No pneumothorax. 4. Lines and tubes as above. 5.  Aortic Atherosclerosis (ICD10-I70.0). Electronically Signed   By: Kreg Shropshire M.D.   On: 10/20/2020 19:50   DG CHEST PORT 1 VIEW  Result Date: 10/20/2020 CLINICAL DATA:  Respiratory distress.  Intubation. EXAM: PORTABLE CHEST 1 VIEW COMPARISON:  10/18/2020 and prior radiographs FINDINGS: An endotracheal tube is now noted. The carina is difficult to definitely identified but the endotracheal tube tip appears to lie approximately 2 cm above the carina. An NG tube is identified extending into the stomach with tip off the field of view. Moderate to large LEFT pleural effusion, LEFT LOWER lung consolidation/atelectasis, mild RIGHT basilar atelectasis, small RIGHT pleural effusion and pulmonary vascular congestion again noted. There is no evidence of pneumothorax. A RIGHT PICC line is present with tip overlying the UPPER RIGHT atrium. IMPRESSION: 1. Endotracheal tube placement with tip approximately 2 cm  above the carina. NG tube placement as described. 2. Unchanged moderate to large LEFT pleural effusion, LEFT LOWER lung consolidation/atelectasis, mild RIGHT basilar atelectasis and small RIGHT pleural effusion. Electronically Signed   By: Harmon Pier M.D.   On: 10/20/2020 04:28   DG Chest Port 1 View  Result Date: 10/18/2020 CLINICAL DATA:  Acute respiratory distress EXAM: PORTABLE CHEST 1 VIEW COMPARISON:  10/16/2020 FINDINGS: NG tube is in the stomach. Moderate to large left effusion and small to moderate right effusion. Worsening airspace disease throughout the left lung. Continued right lower lobe atelectasis or infiltrate. Heart is normal size. IMPRESSION: Bilateral pleural effusions, left larger than right. Bilateral airspace disease, worsening on the left since prior study, atelectasis versus infection. Electronically Signed   By: Charlett Nose M.D.   On: 10/18/2020 22:25   DG Chest Port 1 View  Result Date: 10/13/2020 CLINICAL DATA:  Respiratory failure. EXAM: PORTABLE CHEST  1 VIEW COMPARISON:  09/10/2020 FINDINGS: 0515 hours. Low lung volumes. Retrocardiac left base collapse/consolidation again noted with diffuse interstitial and bilateral airspace disease. Small bilateral effusions persist. A feeding tube passes into the stomach although the distal tip position is not included on the film. Telemetry leads overlie the chest. IMPRESSION: Low volume film with persistent retrocardiac left base collapse/consolidation and diffuse bilateral airspace disease. Electronically Signed   By: Kennith Center M.D.   On: 10/13/2020 08:12   DG Chest Port 1 View  Result Date: 10/11/2020 CLINICAL DATA:  Sepsis EXAM: PORTABLE CHEST 1 VIEW COMPARISON:  10/09/2020 FINDINGS: Two frontal views of the chest demonstrate an enlarged cardiac silhouette. Lung volumes are diminished, with new left basilar consolidation. Small bilateral pleural effusions are identified. No pneumothorax. IMPRESSION: 1. New left basilar  consolidation which could reflect atelectasis or pneumonia. 2. New small bilateral pleural effusions, left greater than right. Electronically Signed   By: Sharlet Salina M.D.   On: 10/11/2020 17:46   DG Abd Portable 1V  Result Date: 10/08/2020 CLINICAL DATA:  Status post NG tube placement. EXAM: PORTABLE ABDOMEN - 1 VIEW COMPARISON:  Single-view of the abdomen 10/20/2020. FINDINGS: NG tube is in place with both the tip and side-port in the stomach. Pigtail catheter right lower quadrant noted. IMPRESSION: NG tube in good position. Electronically Signed   By: Drusilla Kanner M.D.   On: 10/30/2020 12:52   DG Abd Portable 1V  Result Date: 10/20/2020 CLINICAL DATA:  NG tube placement. EXAM: PORTABLE ABDOMEN - 1 VIEW COMPARISON:  Prior CTs and radiographs. FINDINGS: An NG tube is identified with tip overlying the region of the mid stomach. A percutaneous catheter overlying the RIGHT abdomen is noted. No new abnormalities identified. IMPRESSION: NG tube with tip overlying the mid stomach. Electronically Signed   By: Harmon Pier M.D.   On: 10/20/2020 04:30   DG Abd Portable 1V  Result Date: 10/16/2020 CLINICAL DATA:  Nasogastric tube placement EXAM: PORTABLE ABDOMEN - 1 VIEW COMPARISON:  One day prior FINDINGS: Removal of feeding tube with placement of nasogastric tube. This terminates at the gastric body. Percutaneous drain projects over the right upper pelvis. Gas-filled small bowel loops at up to 2.9 cm throughout the left side of the abdomen. Minimal contrast within the ascending colon. IMPRESSION: Nasogastric tube terminating at the gastric body. Gas-filled upper normal sized small bowel loops could mild adynamic ileus or less likely low-grade partial small bowel obstruction. Electronically Signed   By: Jeronimo Greaves M.D.   On: 10/16/2020 13:29   DG Abd Portable 1V  Result Date: 10/10/2020 CLINICAL DATA:  Abdominal distension EXAM: PORTABLE ABDOMEN - 1 VIEW COMPARISON:  10/07/2020 FINDINGS:  Nasogastric tube is been removed. Contrast administered during a fluoroscopic examination of levin/8/21 is now seen throughout the colon and rectum which is nondilated. Normal abdominal gas pattern. No gross free intraperitoneal gas. Osseous structures are unremarkable. Previously noted renal calculi are obscured by intraluminal contrast. IMPRESSION: Normal abdominal gas pattern. Electronically Signed   By: Helyn Numbers MD   On: 10/10/2020 22:26   DG Abd Portable 1V  Result Date: 10/07/2020 CLINICAL DATA:  NG tube placement EXAM: PORTABLE ABDOMEN - 1 VIEW COMPARISON:  October 05, 2020 FINDINGS: The enteric tube projects over the gastric body. The tube is likely kinked at the side hole. The bowel gas pattern is nonobstructive. There is probable bilateral nephrolithiasis. IMPRESSION: Enteric tube projects over the gastric body. The tube is likely kinked at the side hole. Probable bilateral  nephrolithiasis. Electronically Signed   By: Katherine Mantle M.D.   On: 10/07/2020 22:50   DG Swallowing Func-Speech Pathology  Result Date: 10/08/2020 Completed by Royetta Crochet SLP student Supervised and reviewed by Harlon Ditty MA CCC-SLP Objective Swallowing Evaluation: Type of Study: MBS-Modified Barium Swallow Study  Patient Details Name: Mark May MRN: [id number] Date of Birth: 12-30-1955 Today's Date: 10/08/2020 Time: SLP Start Time (ACUTE ONLY): 1250 -SLP Stop Time (ACUTE ONLY): 1307 SLP Time Calculation (min) (ACUTE ONLY): 17 min Past Medical History: Past Medical History: Diagnosis Date . Gout  . Hypertension  Past Surgical History: Past Surgical History: Procedure Laterality Date . PARTIAL GASTRECTOMY  1990  done in [place] for PUD . PROSTATE BIOPSY  ~ 2005  negative for cancer (done in [place]) . RADIOLOGY WITH ANESTHESIA N/A 10/04/2020  Procedure: IR WITH ANESTHESIA;  Surgeon: Radiologist, Medication, MD;  Location: [facility] OR;  Service: Radiology;  Laterality: N/A; HPI: Mark May is a 65 y.o. male with history of  hypertension, BPH, sciatica, stomach ulcer status post surgery in the past presented to ER at [facility] for acute onset left-sided weakness, left facial droop and left hemianopia. CT head and neck showed right M1 high-grade stenosis versus near occlusive thrombosis. CT perfusion showed large penumbra. Patient transferred to [facility] for thrombectomy. Pt intubated 11/5-11/7 with post-extubation respiratory distress.  No data recorded Assessment / Plan / Recommendation CHL IP CLINICAL IMPRESSIONS 10/08/2020 Clinical Impression Pt was seen for MBS with son present for translating. MBS revealed a pharyngeal dysphagia characterized by silent aspiration, pharyngeal residue, and multiple swallows. Given pt's presentation during MBS, suspect he has pharyngeal weakness that is contributing to his dysphagia. Silent aspiration of thin liquid was observed before the pt had achieved full laryngeal closure. Pt's volitional cough was observed to be weak and unsuccessful. No other instances of aspiration were observed. Moderate amounts of pharyngeal residue were observed in the valleulae and pyriforms across POs. SLP educated pt in use of an effortful swallow, which was successful in reducing the amount of pharyngeal residue. Pt was also observed to utilize multiple swallows across POs. Recommend dys 3 diet with nectar thick liquid and use of effortful swallow. SLP will continue to f/u for diet toleration. SLP Visit Diagnosis Dysphagia, pharyngeal phase (R13.13) Attention and concentration deficit following -- Frontal lobe and executive function deficit following -- Impact on safety and function Moderate aspiration risk   CHL IP TREATMENT RECOMMENDATION 10/08/2020 Treatment Recommendations Therapy as outlined in treatment plan below   Prognosis 10/08/2020 Prognosis for Safe Diet Advancement Good Barriers to Reach Goals -- Barriers/Prognosis Comment -- CHL IP DIET RECOMMENDATION 10/08/2020 SLP Diet Recommendations  Dysphagia 3 (Mech soft) solids;Nectar thick liquid Liquid Administration via Cup;Straw Medication Administration Crushed with puree Compensations Effortful swallow Postural Changes Seated upright at 90 degrees   CHL IP OTHER RECOMMENDATIONS 10/08/2020 Recommended Consults -- Oral Care Recommendations Oral care BID Other Recommendations Order thickener from pharmacy   No flowsheet data found.  CHL IP FREQUENCY AND DURATION 10/08/2020 Speech Therapy Frequency (ACUTE ONLY) min 2x/week Treatment Duration 2 weeks      CHL IP ORAL PHASE 10/08/2020 Oral Phase WFL Oral - Pudding Teaspoon -- Oral - Pudding Cup -- Oral - Honey Teaspoon -- Oral - Honey Cup -- Oral - Nectar Teaspoon -- Oral - Nectar Cup -- Oral - Nectar Straw -- Oral - Thin Teaspoon -- Oral - Thin Cup -- Oral - Thin Straw -- Oral - Puree -- Oral -  Mech Soft -- Oral - Regular -- Oral - Multi-Consistency -- Oral - Pill -- Oral Phase - Comment --  CHL IP PHARYNGEAL PHASE 10/08/2020 Pharyngeal Phase Impaired Pharyngeal- Pudding Teaspoon -- Pharyngeal -- Pharyngeal- Pudding Cup -- Pharyngeal -- Pharyngeal- Honey Teaspoon -- Pharyngeal -- Pharyngeal- Honey Cup -- Pharyngeal -- Pharyngeal- Nectar Teaspoon -- Pharyngeal -- Pharyngeal- Nectar Cup -- Pharyngeal -- Pharyngeal- Nectar Straw Pharyngeal residue - valleculae;Pharyngeal residue - pyriform;Compensatory strategies attempted (with notebox) Pharyngeal -- Pharyngeal- Thin Teaspoon -- Pharyngeal -- Pharyngeal- Thin Cup Compensatory strategies attempted (with notebox);Pharyngeal residue - valleculae;Pharyngeal residue - pyriform;Penetration/Aspiration before swallow;Penetration/Aspiration during swallow;Moderate aspiration Pharyngeal Material enters airway, passes BELOW cords without attempt by patient to eject out (silent aspiration) Pharyngeal- Thin Straw Penetration/Aspiration during swallow;Trace aspiration;Pharyngeal residue - valleculae;Pharyngeal residue - pyriform;Compensatory strategies attempted (with notebox)  Pharyngeal Material enters airway, remains ABOVE vocal cords and not ejected out Pharyngeal- Puree Pharyngeal residue - valleculae;Pharyngeal residue - pyriform Pharyngeal -- Pharyngeal- Mechanical Soft -- Pharyngeal -- Pharyngeal- Regular Pharyngeal residue - valleculae;Pharyngeal residue - pyriform Pharyngeal -- Pharyngeal- Multi-consistency -- Pharyngeal -- Pharyngeal- Pill -- Pharyngeal -- Pharyngeal Comment --  No flowsheet data found. DeBlois, Riley Nearing 10/08/2020, 2:16 PM              ECHOCARDIOGRAM COMPLETE  Result Date: 10/06/2020    ECHOCARDIOGRAM REPORT   Patient Name:   AVEL OGAWA Date of Exam: 10/06/2020 Medical Rec #:  [id number]   Height:       67.0 in Accession #:    [id number]  Weight:       125.0 lb Date of Birth:  05-25-1956    BSA:          1.656 m Patient Age:    64 years    BP:           109/63 mmHg Patient Gender: M           HR:           71 bpm. Exam Location:  Inpatient Procedure: 2D Echo Indications:    stroke 434.91  History:        Patient has no prior history of Echocardiogram examinations.                 Risk Factors:Hypertension.  Sonographer:    Celene Skeen RDCS (AE) Referring Phys: [id number] Marvel Plan  Sonographer Comments: Echo performed with patient supine and on artificial respirator. Image acquisition challenging due to respiratory motion. very restricted mobility. difficulty remaining still at times. IMPRESSIONS  1. Left ventricular ejection fraction, by estimation, is 70 to 75%. The left ventricle has hyperdynamic function. The left ventricle has no regional wall motion abnormalities. Left ventricular diastolic parameters are consistent with Grade II diastolic dysfunction (pseudonormalization). Elevated left atrial pressure.  2. Right ventricular systolic function is normal. The right ventricular size is normal.  3. The mitral valve is normal in structure. Mild mitral valve regurgitation. No evidence of mitral stenosis.  4. The aortic valve is normal in structure.  Aortic valve regurgitation is not visualized. No aortic stenosis is present.  5. The inferior vena cava is dilated in size with <50% respiratory variability, suggesting right atrial pressure of 15 mmHg. Conclusion(s)/Recommendation(s): No intracardiac source of embolism detected on this transthoracic study. A transesophageal echocardiogram is recommended to exclude cardiac source of embolism if clinically indicated. FINDINGS  Left Ventricle: Left ventricular ejection fraction, by estimation, is 70 to 75%. The left ventricle has hyperdynamic function. The left ventricle has no regional  wall motion abnormalities. The left ventricular internal cavity size was normal in size. There is no left ventricular hypertrophy. Left ventricular diastolic parameters are consistent with Grade II diastolic dysfunction (pseudonormalization). Elevated left atrial pressure. Right Ventricle: The right ventricular size is normal. No increase in right ventricular wall thickness. Right ventricular systolic function is normal. Left Atrium: Left atrial size was normal in size. Right Atrium: Right atrial size was normal in size. Pericardium: There is no evidence of pericardial effusion. Mitral Valve: The mitral valve is normal in structure. Mild mitral valve regurgitation. No evidence of mitral valve stenosis. Tricuspid Valve: The tricuspid valve is normal in structure. Tricuspid valve regurgitation is trivial. No evidence of tricuspid stenosis. Aortic Valve: The aortic valve is normal in structure. Aortic valve regurgitation is not visualized. No aortic stenosis is present. Pulmonic Valve: The pulmonic valve was normal in structure. Pulmonic valve regurgitation is not visualized. No evidence of pulmonic stenosis. Aorta: The aortic root is normal in size and structure. Venous: The inferior vena cava is dilated in size with less than 50% respiratory variability, suggesting right atrial pressure of 15 mmHg. IAS/Shunts: No atrial level shunt  detected by color flow Doppler.  LEFT VENTRICLE PLAX 2D LVIDd:         3.60 cm  Diastology LVIDs:         1.90 cm  LV e' medial:    7.07 cm/s LV PW:         1.00 cm  LV E/e' medial:  15.8 LV IVS:        0.90 cm  LV e' lateral:   9.46 cm/s LVOT diam:     2.40 cm  LV E/e' lateral: 11.8 LV SV:         155 LV SV Index:   93 LVOT Area:     4.52 cm  LEFT ATRIUM             Index       RIGHT ATRIUM           Index LA diam:        2.90 cm 1.75 cm/m  RA Area:     10.30 cm LA Vol (A2C):   42.3 ml 25.54 ml/m RA Volume:   18.60 ml  11.23 ml/m LA Vol (A4C):   33.2 ml 20.05 ml/m LA Biplane Vol: 39.7 ml 23.97 ml/m  AORTIC VALVE LVOT Vmax:   153.00 cm/s LVOT Vmean:  115.000 cm/s LVOT VTI:    0.342 m  AORTA Ao Root diam: 2.80 cm MITRAL VALVE MV Area (PHT): 2.99 cm     SHUNTS MV Decel Time: 254 msec     Systemic VTI:  0.34 m MV E velocity: 112.00 cm/s  Systemic Diam: 2.40 cm MV A velocity: 94.70 cm/s MV E/A ratio:  1.18 Tobias Alexander MD Electronically signed by Tobias Alexander MD Signature Date/Time: 10/06/2020/3:01:36 PM    Final    IR PERCUTANEOUS ART THROMBECTOMY/INFUSION INTRACRANIAL INC DIAG ANGIO  Result Date: 10/17/2020 INDICATION: New onset of left-sided weakness, associated with facial weakness, and right gaze preference. CT of the brain demonstrating pre occlusive stenosis of the mid right M1 segment due to thrombus +/-intracranial arteriosclerosis. EXAM: 1. EMERGENT LARGE VESSEL OCCLUSION THROMBOLYSIS (anterior CIRCULATION) COMPARISON:  CT angiogram of head and neck of October 05, 2020. MEDICATIONS: The antibiotic was administered within 1 hour of the procedure ANESTHESIA/SEDATION: General anesthesia CONTRAST:  Isovue 300 approximately 125 mL. FLUOROSCOPY TIME:  Fluoroscopy Time: 39 minutes 0 seconds (1592 mGy). COMPLICATIONS:  None immediate. TECHNIQUE: Following a full explanation of the procedure along with the potential associated complications, an informed witnessed consent was obtained. The risks of  intracranial hemorrhage of 10%, worsening neurological deficit, ventilator dependency, death and inability to revascularize were all reviewed in detail with the patient's son. The patient was then put under general anesthesia by the Department of Anesthesiology at [facility]. The right groin was prepped and draped in the usual sterile fashion. Thereafter using modified Seldinger technique, transfemoral access into the right common femoral artery was obtained without difficulty. Over a 0.035 inch guidewire an 8 French 25 cm Pinnacle sheath was inserted. Through this, and also over a 0.035 inch guidewire a 5 [place] JB 1 catheter was advanced to the aortic arch region and selectively positioned in the right common carotid artery. FINDINGS: The right common carotid arteriogram demonstrates the right external carotid artery and its major branches to be widely patent. The right internal carotid artery at the bulb to the cranial skull base is also widely patent. The petrous, the cavernous and the supraclinoid segments are widely patent. The right anterior cerebral artery opacifies into the capillary and venous phases. Prompt cross filling via the anterior communicating artery of the left anterior cerebral A2 segment is also seen. The right middle cerebral artery demonstrates a severe tapered near occlusive stenosis of the mid right A1 segment. More distally delayed opacification of the right middle cerebral trifurcation branches is seen into the capillary and venous phases. There is delayed hemodynamic flow through the right middle cerebral artery distribution. PROCEDURE: The diagnostic JB 1 catheter in the right common carotid artery was exchanged over a 0.035 inch 300 cm Rosen exchange guidewire for an 087 balloon guide catheter which had been prepped with 50% contrast and 50% heparinized saline infusion. The guidewire was removed. Good aspiration obtained from the hub of the balloon guide catheter. A gentle  control arteriogram performed through this demonstrated no change in the extracranial or intracranial circulations. Over a 0.035 inch Roadrunner guidewire, a 6 French 132 cm Catalyst guide catheter was then advanced and positioned in the horizontal petrous segment of the right internal carotid artery. The guidewire was removed. Good aspiration was obtained from the hub of the 6 [place] guide sheath. A control arteriogram was then performed intracranially. 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 Over a 0.014 inch standard Synchro micro guidewire with a J-tip configuration, an 021 Trevo ProVue microcatheter was advanced to the supraclinoid right ICA. Using a torque device the guidewire was then advanced without difficulty through the high-grade stenosis into the inferior division of the right middle cerebral artery followed by the microcatheter. The guidewire was removed. Good aspiration obtained from the hub of the microcatheter. A gentle control arteriogram performed through the microcatheter demonstrated safe position of the tip of the microcatheter. This was then connected to continuous heparinized saline infusion. A Tiger 21 retrieval device was then prepped and purged with heparinized saline infusion then advanced to the distal end of the microcatheter. The O ring on the delivery microcatheter was loosened. The Tiger 21 was then deployed by retrieving the delivery microcatheter slowly first delivering the distal micro guidewire and then the proximal retrieval device. The proximal end of the retrieval device was positioned at the proximal aspect of the occlusion. Proximal flow arrest was initiated in the right internal carotid artery. Thereafter, the diameter  of the retrieval device was then increased and decreased for cycle. The microcatheter was advanced to pinch  the proximal marker on the retrieval device. The aspiration catheter was then brought just proximal to the retrieval device. Constant aspiration was performed at the hub of the 6 [place] Catalyst guide catheter with a Penumbra aspiration pump for approximately a minute and half followed by the retrieval device, the microcatheter, and the 6 [place] Catalyst guide catheter which were locked. Pieces of clot were seen entangled in the retrieval device. Flow arrest was reversed. Control arteriogram performed through the balloon guide in the right internal carotid artery demonstrated complete revascularization of the right middle cerebral artery distribution achieving a TICI 3 revascularization. The right middle cerebral artery was now widely patent with a patency of 75% at the site of previous occlusion. Also seen was related plaque against the wall of the native vessel. A control arteriogram performed approximately 3 minutes later demonstrated progressive decrease in the caliber with slow flow in the right middle cerebral artery distribution. This progressed to complete occlusion. This prompted the placement of a rescue stent. Measurements were performed of the right middle cerebral artery proximally and distally. An 021 microcatheter was then advanced inside of a Catalyst 6 [place] guide catheter over a 0.014 inch standard Synchro micro guidewire. The micro guidewire was then advanced without difficulty into the distal M2 M3 region of the inferior division followed by the microcatheter. The guidewire was removed. Good aspiration obtained from microcatheter. This was then connected to continuous heparinized saline infusion. A 4 mm x 24 mm Atlas Neuroform stent was then advanced to the distal end of the microcatheter. The proximal and the distal landing zone markers were aligned in the appropriate position relative to the site of the severe stenosis. This was then deployed in the usual manner without any difficulty. A  control arteriogram performed following the delivery of the stent demonstrated excellent apposition with significantly improved caliber and flow through the stented segment. There continued to be a mild to modest waist which remained stable over the next 20 minutes as per diagnostic arteriograms performed through the 6 [place] Catalyst guide catheter in the right internal carotid artery. A TICI 3 revascularization had been re-established. A 30 minute control arteriogram performed through the 6 [place] Catalyst guide catheter in the right internal carotid artery continued to demonstrate excellent flow through the stented segment of the right middle cerebral artery and the right anterior cerebral artery distributions. This was then retrieved and removed. A final control arteriogram performed through the balloon guide catheter in the right common carotid artery demonstrated wide patency of the right internal carotid artery proximally and distally. The balloon guide was removed. A short 8 French sheath was then positioned in the right common femoral puncture site and connected to continuous heparinized saline infusion. Distal pulses remained Dopplerable in the dorsalis pedis, and the posterior tibial regions bilaterally. A CT of the brain performed on the table demonstrated no evidence of hemorrhage, mass effect or midline shift. Patient was left intubated and transferred to the ICU for post thrombectomy management. Patient was loaded with 81 mg of aspirin, and 180 mg of Brilinta via orogastric tube prior to the placement of the stent. Also the patient was given a loading dose of cangrelor followed by a 4 hour infusion with a CT to follow at the end. IMPRESSION: Status post endovascular complete revascularization of the near complete occlusion of the right middle cerebral M1 segment with 1 pass with the  Tiger 21 retrieval device and aspiration achieving a TICI 3 revascularization, followed by placement of a rescue stent  for reocclusion again achieving a TICI 3 revascularization. PLAN: Follow-up in the clinic 4 to 6 weeks post discharge. Electronically Signed   By: Julieanne Cotton M.D.   On: 10/08/2020 11:19   CT IMAGE GUIDED DRAINAGE BY PERCUTANEOUS CATHETER  Result Date: 10/14/2020 CLINICAL DATA:  Large retroperitoneal abscess centered primarily inferior to the right kidney. EXAM: CT GUIDED CATHETER DRAINAGE OF RIGHT RETROPERITONEAL ABSCESS ANESTHESIA/SEDATION: 2.0 mg IV Versed 100 mcg IV Fentanyl Total Moderate Sedation Time:  12 minutes The patient's level of consciousness and physiologic status were continuously monitored during the procedure by Radiology nursing. PROCEDURE: The procedure, risks, benefits, and alternatives were explained to the patient. Questions regarding the procedure were encouraged and answered. The patient understands and consents to the procedure. A foreign language interpreter was utilized for consent. A time out was performed prior to initiating the procedure. CT was performed through the abdomen in a prone position. The right translumbar region was prepped with chlorhexidine in a sterile fashion, and a sterile drape was applied covering the operative field. A sterile gown and sterile gloves were used for the procedure. Local anesthesia was provided with 1% Lidocaine. Under CT guidance, an 18 gauge trocar needle was advanced into the right retroperitoneal space. After return of fluid, a guidewire was advanced. The tract was dilated and a 12 French percutaneous drainage catheter placed. Additional fluid sample was aspirated from the drain and sent for culture analysis. Catheter position was confirmed by CT. The catheter was attached to suction bulb drainage. It was secured at the skin with a Prolene retention suture and StatLock device. COMPLICATIONS: None FINDINGS: The retroperitoneal collection yielded reddish brown fluid. After drain placement there is copious return of fluid. IMPRESSION:  CT-guided percutaneous catheter drainage of right retroperitoneal abscess yielding reddish brown fluid. A fluid sample was sent for culture analysis. A 12 French drainage catheter was placed and attached to suction bulb drainage. Electronically Signed   By: Irish Lack M.D.   On: 10/14/2020 12:47   VAS [place] UPPER EXTREMITY VENOUS DUPLEX  Result Date: 10/08/2020 UPPER VENOUS STUDY  Indications: Swelling Risk Factors: None identified. Comparison Study: No prior studies. Performing Technologist: Chanda Busing RVT  Examination Guidelines: A complete evaluation includes B-mode imaging, spectral Doppler, color Doppler, and power Doppler as needed of all accessible portions of each vessel. Bilateral testing is considered an integral part of a complete examination. Limited examinations for reoccurring indications may be performed as noted.  Right Findings: +----------+------------+---------+-----------+----------+-------+ RIGHT     CompressiblePhasicitySpontaneousPropertiesSummary +----------+------------+---------+-----------+----------+-------+ Subclavian    Full       Yes       Yes                      +----------+------------+---------+-----------+----------+-------+  Left Findings: +----------+------------+---------+-----------+----------+-----------------+ LEFT      CompressiblePhasicitySpontaneousProperties     Summary      +----------+------------+---------+-----------+----------+-----------------+ IJV           Full       Yes       Yes                                +----------+------------+---------+-----------+----------+-----------------+ Subclavian    Full       Yes       Yes                                +----------+------------+---------+-----------+----------+-----------------+  Axillary      Full       Yes       Yes                                +----------+------------+---------+-----------+----------+-----------------+ Brachial      Full       Yes        Yes                                +----------+------------+---------+-----------+----------+-----------------+ Radial        Full                                                    +----------+------------+---------+-----------+----------+-----------------+ Ulnar         Full                                                    +----------+------------+---------+-----------+----------+-----------------+ Cephalic      None                                  Age Indeterminate +----------+------------+---------+-----------+----------+-----------------+ Basilic       Full                                                    +----------+------------+---------+-----------+----------+-----------------+  Summary:  Right: No evidence of thrombosis in the subclavian.  Left: No evidence of deep vein thrombosis in the upper extremity. Findings consistent with age indeterminate superficial vein thrombosis involving the left cephalic vein.  *See table(s) above for measurements and observations.  Diagnosing physician: Sherald Hess MD Electronically signed by Sherald Hess MD on 10/08/2020 at 3:28:16 PM.    Final    [place] EKG SITE RITE  Result Date: 10/31/2020 If Site Rite image not attached, placement could not be confirmed due to current cardiac rhythm.  [place] EKG Site Rite  Result Date: 10/23/2020 If Site Rite image not attached, placement could not be confirmed due to current cardiac rhythm.  [place] EKG SITE RITE  Result Date: 10/17/2020 If Site Rite image not attached, placement could not be confirmed due to current cardiac rhythm.  [place] EKG SITE RITE  Result Date: 10/13/2020 If Site Rite image not attached, placement could not be confirmed due to current cardiac rhythm.   Microbiology No results found for this or any previous visit (from the past 240 hour(s)).  Lab Basic Metabolic Panel: Recent Labs  Lab 11/02/20 0421 11/02/20 1546 11/03/20 0317 11/03/20 0317  11/03/20 0756 11/03/20 0756 11/03/20 1530 11/03/20 2255 11/03/2020 0342 11/21/2020 0343 11/03/2020 0515 11/03/2020 0858  NA 139   < > 135   < > 138   < > 137 136 131*  --  134* 135  K 3.6   < > 3.7   < > 3.6   < > 3.7 3.6 3.7  --  3.5  3.6  CL 90*   < > 92*  --  95*  --  99  --  97*  --   --  100  CO2 19*   < > 20*  --  22  --  22  --  16*  --   --  13*  GLUCOSE 130*   < > 490*  --  158*  --  136*  --  497*  --   --  171*  BUN 123*   < > 70*  --  61*  --  51*  --  32*  --   --  30*  CREATININE 5.51*   < > 2.92*  --  2.68*  --  2.20*  --  1.45*  --   --  1.19  CALCIUM 7.1*   < > 7.8*  --  7.5*  --  7.4*  --  8.0*  --   --  7.6*  MG 2.3  --  2.8*  --  2.4  --   --   --   --  2.7*  --  2.6*  PHOS 5.6*   < > 2.8  --  2.3*  --  1.8*  --  2.0*  --   --  3.0   < > = values in this interval not displayed.   Liver Function Tests: Recent Labs  Lab 10/03/2020 0529 10/27/2020 0529 10/31/20 0231 10/31/20 0231 11/01/20 0800 11/01/20 0800 11/02/20 1546 11/03/20 0317 11/03/20 0756 11/03/20 1530 07-Nov-2020 0342  AST 89*  --  2,275*  --  3,711*  --   --   --  985*  --   --   ALT 124*  --  1,447*  --  3,092*  --   --   --  2,184*  --   --   ALKPHOS 206*  --  185*  --  208*  --   --   --  235*  --   --   BILITOT 1.3*  --  5.8*  --  5.4*  --   --   --  6.0*  --   --   PROT 5.9*  --  4.9*  --  4.2*  --   --   --  4.3*  --   --   ALBUMIN 1.4*   < > 1.6*   < > 1.2*   < > <1.0* <1.0* 1.1* 1.1* 1.3*   < > = values in this interval not displayed.   No results for input(s): LIPASE, AMYLASE in the last 168 hours. No results for input(s): AMMONIA in the last 168 hours. CBC: Recent Labs  Lab 10/21/2020 0529 10/07/2020 1735 11/01/20 0423 11/01/20 0423 11/02/20 0737 11/02/20 0737 11/02/20 2252 11/03/20 2110 11/03/20 2255 Nov 07, 2020 0014 Nov 07, 2020 0343 November 07, 2020 0515  WBC 12.5*   < > 17.7*  --  14.8*  --  12.7* 26.9*  --   --  36.6*  --   NEUTROABS 10.0*  --   --   --   --   --  9.0* 14.4*  --   --   --   --    HGB 8.2*   < > 8.2*   < > 8.2*   < > 8.7* 8.4* 7.8*  --  8.8* 9.2*  HCT 26.0*   < > 24.2*   < > 23.6*   < > 24.6* 25.2* 23.0*  --  24.2* 27.0*  MCV 87.5   < > 88.3  --  86.1  --  87.9 89.4  --   --  92.0  --   PLT 303   < > 107*   < > 81*  --  69* 66*  --  60* 69*  --    < > = values in this interval not displayed.   Cardiac Enzymes: No results for input(s): CKTOTAL, CKMB, CKMBINDEX, TROPONINI in the last 168 hours. Sepsis Labs: Recent Labs  Lab 10/17/2020 2100 10/30/20 0339 10/30/20 0948 10/30/20 1912 10/31/20 0237 10/31/20 1158 10/31/20 1426 11/02/20 0737 11/02/20 2252 11/03/20 2110 11/13/2020 0343  WBC 41.8*   < > 28.5*   < >  --   --    < > 14.8* 12.7* 26.9* 36.6*  LATICACIDVEN >11.0*  --  >11.0*  --  8.2* 10.8*  --   --   --   --   --    < > = values in this interval not displayed.    Procedures/Operations  10-27-20 R MCA thrombectomy and stent placement 10/12/20 Cortrak placement 10/14/20 CT guided drain for Retroperitoneal abscess 10/20/20 Intubation 10/20/20 Thoracentesis 10/13/2020 VATS 10/23/2020 EGD 10/02/2020 intubation 11/02/20 HD catheter placement 10/31/2020 arterial line    Tessie Fass MSN, AGACNP-BC [facility] 11/19/2020, 5:56 PM

## 2020-12-01 NOTE — Progress Notes (Addendum)
Levophed requirement increasing, heart rhythm is in A-fib w/RVR. CRRT fluid removal is currently at zero. E-link made aware.

## 2020-12-01 NOTE — Progress Notes (Signed)
PCCM:  I met with the patient's son at bedside.  Patient had escalating vasopressor (maxed drip rates above usual guard rails) requirements, and profound vasoplegic shock, and not tolerating of CVVHD overnight.    The patient is actively dying.  Discussed current situation with the patient's son at bedside.  I explained that if he passes additional chest compressions on multiple vasopressors and mechanical life support with CVVHD will not change his outcome.  Patient is now a DNR.  Patient's son wants him to remain comfortable in the current state until family can be at bedside.  I explained that he is not getting any medications right now for comfort or pain control and that we would ensure that he remains comfortable until family arrives.  Comfort care orders have been placed.  We will await patient's family for next steps.  Garner Nash, DO [facility] 11/10/2020 9:52 AM

## 2020-12-01 NOTE — Progress Notes (Signed)
Patient ID: Mark May, male   DOB: 04-23-1956, 66 y.o.   MRN: [id number] S: Pt not tolerating CRRT and has continued to decline clinically O:BP 116/84   Pulse (!) 101   Temp (!) 93.4 F (34.1 C) (Axillary)   Resp (!) 30   Wt 73.1 kg   SpO2 97%   BMI 25.24 kg/m   Intake/Output Summary (Last 24 hours) at 2020-12-03 1131 Last data filed at 12-03-2020 1100 Gross per 24 hour  Intake 5029.58 ml  Output 2543 ml  Net 2486.58 ml   Intake/Output: I/O last 3 completed shifts: In: 5183 [I.V.:4367; Blood:68; IV Piggyback:748.1] Out: 5059 [Drains:40; WUJWJ:1914]  Intake/Output this shift:  Total I/O In: 2040.3 [I.V.:1675.3; Blood:315; IV Piggyback:50] Out: -17  Weight change: 2 kg Gen: cachectic, on vent CVS: tachy at 101 Resp: occ rhonchi Abd: distended, hypoactive bowel sounds Ext:1+ anasarca  Recent Labs  Lab 10/06/2020 0529 10/09/2020 1628 10/31/20 0231 10/31/20 1211 11/01/20 0800 11/01/20 0800 11/02/20 0421 11/02/20 0421 11/02/20 1546 11/02/20 1546 11/03/20 0317 11/03/20 0756 11/03/20 1530 11/03/20 2255 12-03-2020 0342 December 03, 2020 0515 2020/12/03 0858  NA 147*   < > 145   < > 142   < > 139   < > 139   < > 135 138 137 136 131* 134* 135  K 4.0   < > 4.4   < > 4.0   < > 3.6   < > 3.3*   < > 3.7 3.6 3.7 3.6 3.7 3.5 3.6  CL 111   < > 95*   < > 93*   < > 90*  --  91*  --  92* 95* 99  --  97*  --  100  CO2 24   < > 23   < > 18*   < > 19*  --  21*  --  20* 22 22  --  16*  --  13*  GLUCOSE 175*   < > 74   < > 123*   < > 130*  --  130*  --  490* 158* 136*  --  497*  --  171*  BUN 56*   < > 91*   < > 105*   < > 123*  --  123*  --  70* 61* 51*  --  32*  --  30*  CREATININE 1.46*   < > 3.94*   < > 5.03*   < > 5.51*  --  5.41*  --  2.92* 2.68* 2.20*  --  1.45*  --  1.19  ALBUMIN 1.4*  --  1.6*  --  1.2*  --   --   --  <1.0*  --  <1.0* 1.1* 1.1*  --  1.3*  --   --   CALCIUM 7.8*   < > 6.4*   < > 6.5*   < > 7.1*  --  7.1*  --  7.8* 7.5* 7.4*  --  8.0*  --  7.6*  PHOS 2.9  --  5.9*  --  6.7*    < > 5.6*  --  4.5  --  2.8 2.3* 1.8*  --  2.0*  --  3.0  AST 89*  --  2,275*  --  3,711*  --   --   --   --   --   --  985*  --   --   --   --   --   ALT 124*  --  1,447*  --  3,092*  --   --   --   --   --   --  2,184*  --   --   --   --   --    < > = values in this interval not displayed.   Liver Function Tests: Recent Labs  Lab 10/31/20 0231 10/31/20 0231 11/01/20 0800 11/02/20 1546 11/03/20 0756 11/03/20 1530 December 03, 2020 0342  AST 2,275*  --  3,711*  --  985*  --   --   ALT 1,447*  --  3,092*  --  2,184*  --   --   ALKPHOS 185*  --  208*  --  235*  --   --   BILITOT 5.8*  --  5.4*  --  6.0*  --   --   PROT 4.9*  --  4.2*  --  4.3*  --   --   ALBUMIN 1.6*   < > 1.2*   < > 1.1* 1.1* 1.3*   < > = values in this interval not displayed.   No results for input(s): LIPASE, AMYLASE in the last 168 hours. No results for input(s): AMMONIA in the last 168 hours. CBC: Recent Labs  Lab 10/14/2020 0529 10/04/2020 1735 11/01/20 0423 11/01/20 0423 11/02/20 0737 11/02/20 0737 11/02/20 2252 11/02/20 2252 11/03/20 2110 11/03/20 2110 11/03/20 2255 12-03-20 0014 Dec 03, 2020 0343 12/03/2020 0515  WBC 12.5*   < > 17.7*   < > 14.8*   < > 12.7*  --  26.9*  --   --   --  36.6*  --   NEUTROABS 10.0*  --   --   --   --   --  9.0*  --  14.4*  --   --   --   --   --   HGB 8.2*   < > 8.2*   < > 8.2*   < > 8.7*   < > 8.4*   < > 7.8*  --  8.8* 9.2*  HCT 26.0*   < > 24.2*   < > 23.6*   < > 24.6*   < > 25.2*   < > 23.0*  --  24.2* 27.0*  MCV 87.5   < > 88.3  --  86.1  --  87.9  --  89.4  --   --   --  92.0  --   PLT 303   < > 107*   < > 81*   < > 69*   < > 66*  --   --  60* 69*  --    < > = values in this interval not displayed.   Cardiac Enzymes: No results for input(s): CKTOTAL, CKMB, CKMBINDEX, TROPONINI in the last 168 hours. CBG: Recent Labs  Lab 11/03/20 2002 11/03/20 2344 Dec 03, 2020 0404 03-Dec-2020 0410 December 03, 2020 0737  GLUCAP 95 117* 447* 121* 170*    Iron Studies: No results for input(s):  IRON, TIBC, TRANSFERRIN, FERRITIN in the last 72 hours. Studies/Results: DG CHEST PORT 1 VIEW  Result Date: 11/02/2020 CLINICAL DATA:  Central line placement. EXAM: PORTABLE CHEST 1 VIEW COMPARISON:  Earlier same day FINDINGS: 1425 hours. Endotracheal tube tip is 4.1 cm above the base of the carina. Right IJ central line tip overlies the distal SVC level. Right PICC line tip is also position over the region of the distal SVC. Bibasilar collapse/consolidative opacity is stable with no substantial change bilateral pleural effusions. Telemetry leads overlie the chest. IMPRESSION: 1. Right IJ central line tip  overlies the distal SVC level. No pneumothorax. 2. Otherwise no substantial interval change. Electronically Signed   By: Misty Stanley M.D.   On: 11/02/2020 14:59   .  stroke: mapping our early stages of recovery book   Does not apply Once  . sodium chloride   Intravenous Once  . sodium chloride   Intravenous Once  . chlorhexidine gluconate (MEDLINE KIT)  15 mL Mouth Rinse BID  . Chlorhexidine Gluconate Cloth  6 each Topical Daily  . insulin aspart  3-9 Units Subcutaneous Q4H  . mouth rinse  15 mL Mouth Rinse 10 times per day  . pantoprazole  40 mg Intravenous Q12H  . sodium chloride flush  10-40 mL Intracatheter Q12H  . sodium chloride flush  10-40 mL Intracatheter Q12H  . sodium chloride flush  5 mL Intracatheter Q8H    BMET    Component Value Date/Time   NA 135 28-Nov-2020 0858   NA 140 03/09/2018 1620   NA 136 01/06/2015 1448   K 3.6 11-28-2020 0858   K 3.6 01/06/2015 1448   CL 100 11-28-20 0858   CL 99 01/06/2015 1448   CO2 13 (L) 2020/11/28 0858   CO2 28 01/06/2015 1448   GLUCOSE 171 (H) 2020-11-28 0858   GLUCOSE 154 (H) 01/06/2015 1448   BUN 30 (H) 11/28/2020 0858   BUN 12 03/09/2018 1620   BUN 12 01/06/2015 1448   CREATININE 1.19 11/28/2020 0858   CREATININE 0.88 01/06/2015 1448   CALCIUM 7.6 (L) 11-28-2020 0858   CALCIUM 8.7 01/06/2015 1448   GFRNONAA >60 28-Nov-2020  0858   GFRNONAA >60 01/06/2015 1448   GFRAA 113 03/09/2018 1620   GFRAA >60 01/06/2015 1448   CBC    Component Value Date/Time   WBC 36.6 (H) 11/28/20 0343   RBC 2.63 (L) 11-28-2020 0343   HGB 9.2 (L) 11-28-2020 0515   HGB 15.9 03/09/2018 1620   HCT 27.0 (L) 2020-11-28 0515   HCT 46.9 03/09/2018 1620   PLT 69 (L) 28-Nov-2020 0343   PLT 331 03/09/2018 1620   MCV 92.0 11-28-2020 0343   MCV 90 03/09/2018 1620   MCV 80 01/06/2015 1448   MCH 33.5 2020/11/28 0343   MCHC 36.4 (H) 11-28-20 0343   RDW 18.6 (H) 11/28/20 0343   RDW 14.7 03/09/2018 1620   RDW 17.1 (H) 01/06/2015 1448   LYMPHSABS 1.8 11/03/2020 2110   LYMPHSABS 1.9 03/09/2018 1620   LYMPHSABS 0.9 (L) 01/06/2015 1448   MONOABS 0.7 11/03/2020 2110   MONOABS 0.5 01/06/2015 1448   EOSABS 0.7 (H) 11/03/2020 2110   EOSABS 0.1 03/09/2018 1620   EOSABS 0.0 01/06/2015 1448   BASOSABS 0.2 (H) 11/03/2020 2110   BASOSABS 0.0 03/09/2018 1620   BASOSABS 0.1 01/06/2015 1448    Assessment/Plan: 1. AKI, oliguric- presumably due to ischemic ATN in setting of shock and ABLA.   1. Started CRRT on 11/02/20 and has not been tolerating it well with increased pressor requirements.  Discussed with PCCM as well as sone that continuing CRRT would be medical futility.  Awaiting family given significant decline and pending demise today.  Will stop CRRT and sign off as nothing further to add. 2. Metabolic acidosis- due to #1. Currently on bicarb drip but CO2 dropping. 3. Acute respiratory failure due to aspiration pneumonia- on vent per PCCM 4. Shock- multifactorial with infection and ABLA as well as A fib and RVR. Currently on levophed drip. Wean per PCCM 5. Klebsiella infection- from pleural culture and retroperitoneal  abscess. Currently on Zosyn. Need to adjust dose due to AKI and GFR <20 6. ABLA from GI bleed- cont to transfuse as needed 7. Duodenal perforation- on abx. GI and surgery following. 8. PAF with RVR- currently rate  controlled with amiodarone. No heparin due to GIB.  9. Left hemothorax s/p VATS. 10. Severe protein malnutrition- per PCCM 11. 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 12. Disposition- poor overall prognosis. Continue with aggressive measures for now but may benefit from Palliative care consult to help set goals/limits of care.  Donetta Potts, MD [employer] [phone]

## 2020-12-01 NOTE — Progress Notes (Signed)
Contacted [facility] for patient with GCS of 3. Spoke with Fredrik Cove and awaiting follow up call for potential organ donation. Referral number [id number].

## 2020-12-01 NOTE — Progress Notes (Addendum)
eLink Physician-Brief Progress Note Patient Name: Ociel Retherford DOB: 02/08/1956 MRN: [id number]   Date of Service  November 23, 2020  HPI/Events of Note  MAP < 60. Levo up 60. On avso. Asking for 3 rd pressor.  Camera: In synchrony. MAP > 70 now. HR 80's. Still HD cath oozing. JP drain no bleeding. Abdomen is distended and taught, going on for some time. Primary team is aware. CRRT- neutral balance.  eICU Interventions  65 yr old CVA, s/p RPB, JP drain, [place] PNA septic shock. overnight placed on Vasopressin . HD /CRRT cath oozing blood with DIC work up: fibrinogen 395, INR 1.5, Hg 7.7. now needing third pressor-neo. ordered 1 prbc, 1 platelet for now. has Schistocytes on smear. could have TTP? Unlikly. Elevated d imer. From RPB?Marland Kitchen Even if has PE, not a candidate for tPA or heparin gtt.   Requested bed side ICU team to evaluate  Also.      Intervention Category Major Interventions: Shock - evaluation and management  Ranee Gosselin 2020/11/23, 4:20 AM

## 2020-12-01 NOTE — Procedures (Signed)
Arterial Catheter Insertion Procedure Note  Mark May  [id number]  1956-06-23  Date:11/20/2020  Time:6:52 AM    Provider Performing: Hassan Buckler    Procedure: Insertion of Arterial Line (04888) without US guidance  Indication(s) Blood pressure monitoring and/or need for frequent ABGs  Consent Unable to obtain consent due to emergent nature of procedure.  Anesthesia None   Time Out Verified patient identification, verified procedure, site/side was marked, verified correct patient position, special equipment/implants available, medications/allergies/relevant history reviewed, required imaging and test results available.   Sterile Technique Maximal sterile technique including full sterile barrier drape, hand hygiene, sterile gown, sterile gloves, mask, hair covering, sterile ultrasound probe cover (if used).   Procedure Description Area of catheter insertion was cleaned with chlorhexidine and draped in sterile fashion. Without real-time ultrasound guidance an arterial catheter was placed into the left radial artery.  Appropriate arterial tracings confirmed on monitor.     Complications/Tolerance None; patient tolerated the procedure well.   EBL Minimal   Specimen(s) None

## 2020-12-01 NOTE — Progress Notes (Signed)
eLink Physician-Brief Progress Note Patient Name: Mark May DOB: [birth year] MRN: [id number]   Date of Service  11/02/2020  HPI/Events of Note  On 3 pressors. Unable to get temperature and can he have art line.    eICU Interventions  RT to place art line. Temp check esophageal temp.  Discussed with bed side RN.      Intervention Category Intermediate Interventions: Other:  Ranee Gosselin 11/16/2020, 5:46 AM

## 2020-12-01 NOTE — Progress Notes (Signed)
Asked to see for continued oozing from dialysis catheter hypotension that seems in part related to patient going intermittently into Afib with RVR.  There is significant clot around dialysis catheter, I am reluctant to clean it all out in that it may at this point be preventing worsening bleeding.  Will transfuse platlets and 1 unit PRBCs with anemia, HBG 7.7.  Will place a sand bag over pressure bandage.   Will also restart amiodarone.  Patient is on Maximum levophed and on .03 of vasopressin with MAP 65.

## 2020-12-01 NOTE — Progress Notes (Signed)
[address] notified of TOD. Pt suitable for tissue donation only. Bedside RN notified

## 2020-12-01 NOTE — Progress Notes (Signed)
eLink Physician-Brief Progress Note Patient Name: Mark May DOB: [birth year] MRN: [id number]   Date of Service  2020-11-15  HPI/Events of Note  DIC panel reviewed. Fibrinogen > 350. Schistocyte is +. No suspicion of TTP. INR elevated but < 2. Hg stable.  eICU Interventions  Continue care AM team to look into HD cath site for any venous leakage. Transfuse if Hg < 7.   Discussed with bed side RN.     Intervention Category Intermediate Interventions: Diagnostic test evaluation;Thrombocytopenia - evaluation and management  Ranee Gosselin 15-Nov-2020, 3:35 AM

## 2020-12-01 NOTE — Progress Notes (Addendum)
HD catheter site still oozing blood despite reinforcing site in an attempt to help stop the bleed. E-link made aware.

## 2020-12-01 DEATH — deceased

## 2022-05-13 IMAGING — DX DG CHEST 1V
1 series · 1 of 1 positions shown · non-contrast
Comparison: None.

CLINICAL DATA: Chest pain.

EXAM:
CHEST  1 VIEW

[chest ap]
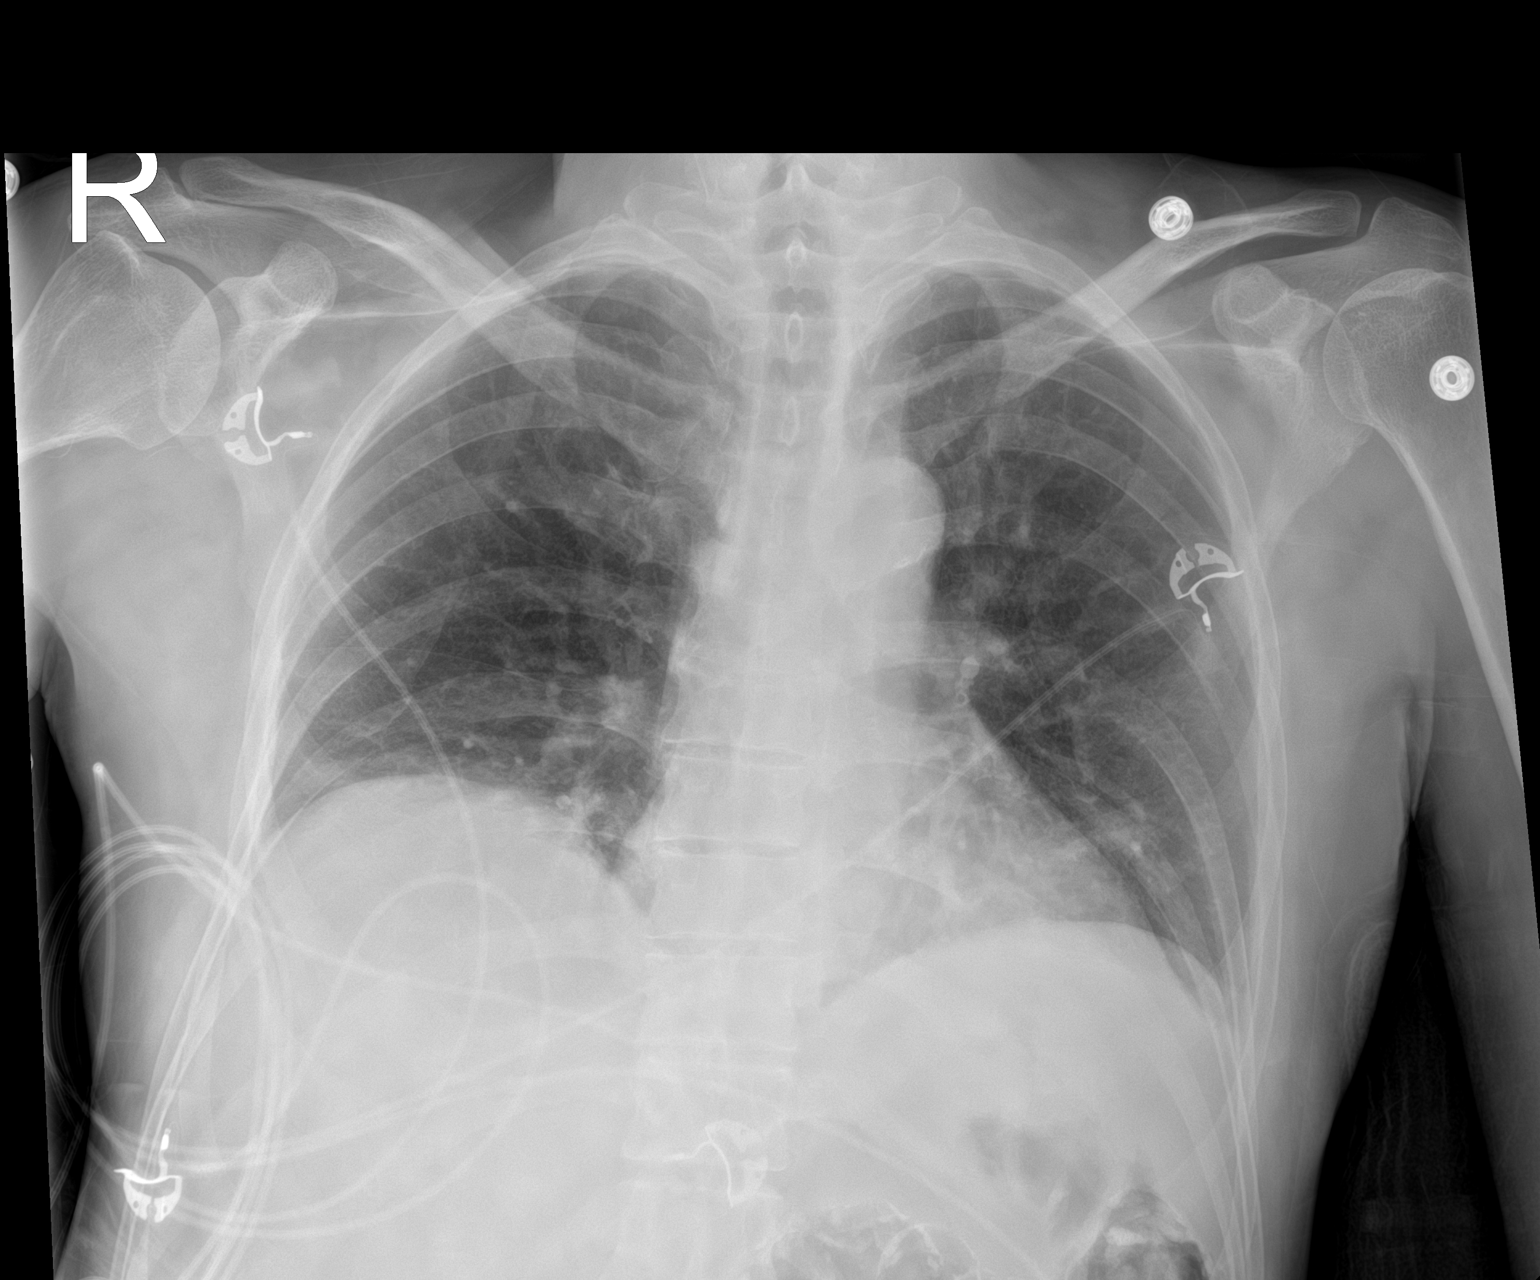

[1 of 1 positions shown; findings below may reference images not displayed]

FINDINGS: The heart size and mediastinal contours are within normal limits.
Both lungs are clear. The visualized skeletal structures are
unremarkable. Aortic calcifications are noted.
IMPRESSION: No active disease.

## 2022-05-14 IMAGING — DX DG ABD PORTABLE 1V
2 series · 2 of 2 positions shown · non-contrast
Comparison: 10/07/2020

CLINICAL DATA: Abdominal distension

EXAM:
PORTABLE ABDOMEN - 1 VIEW

[abdomen kub (1 of 2)]
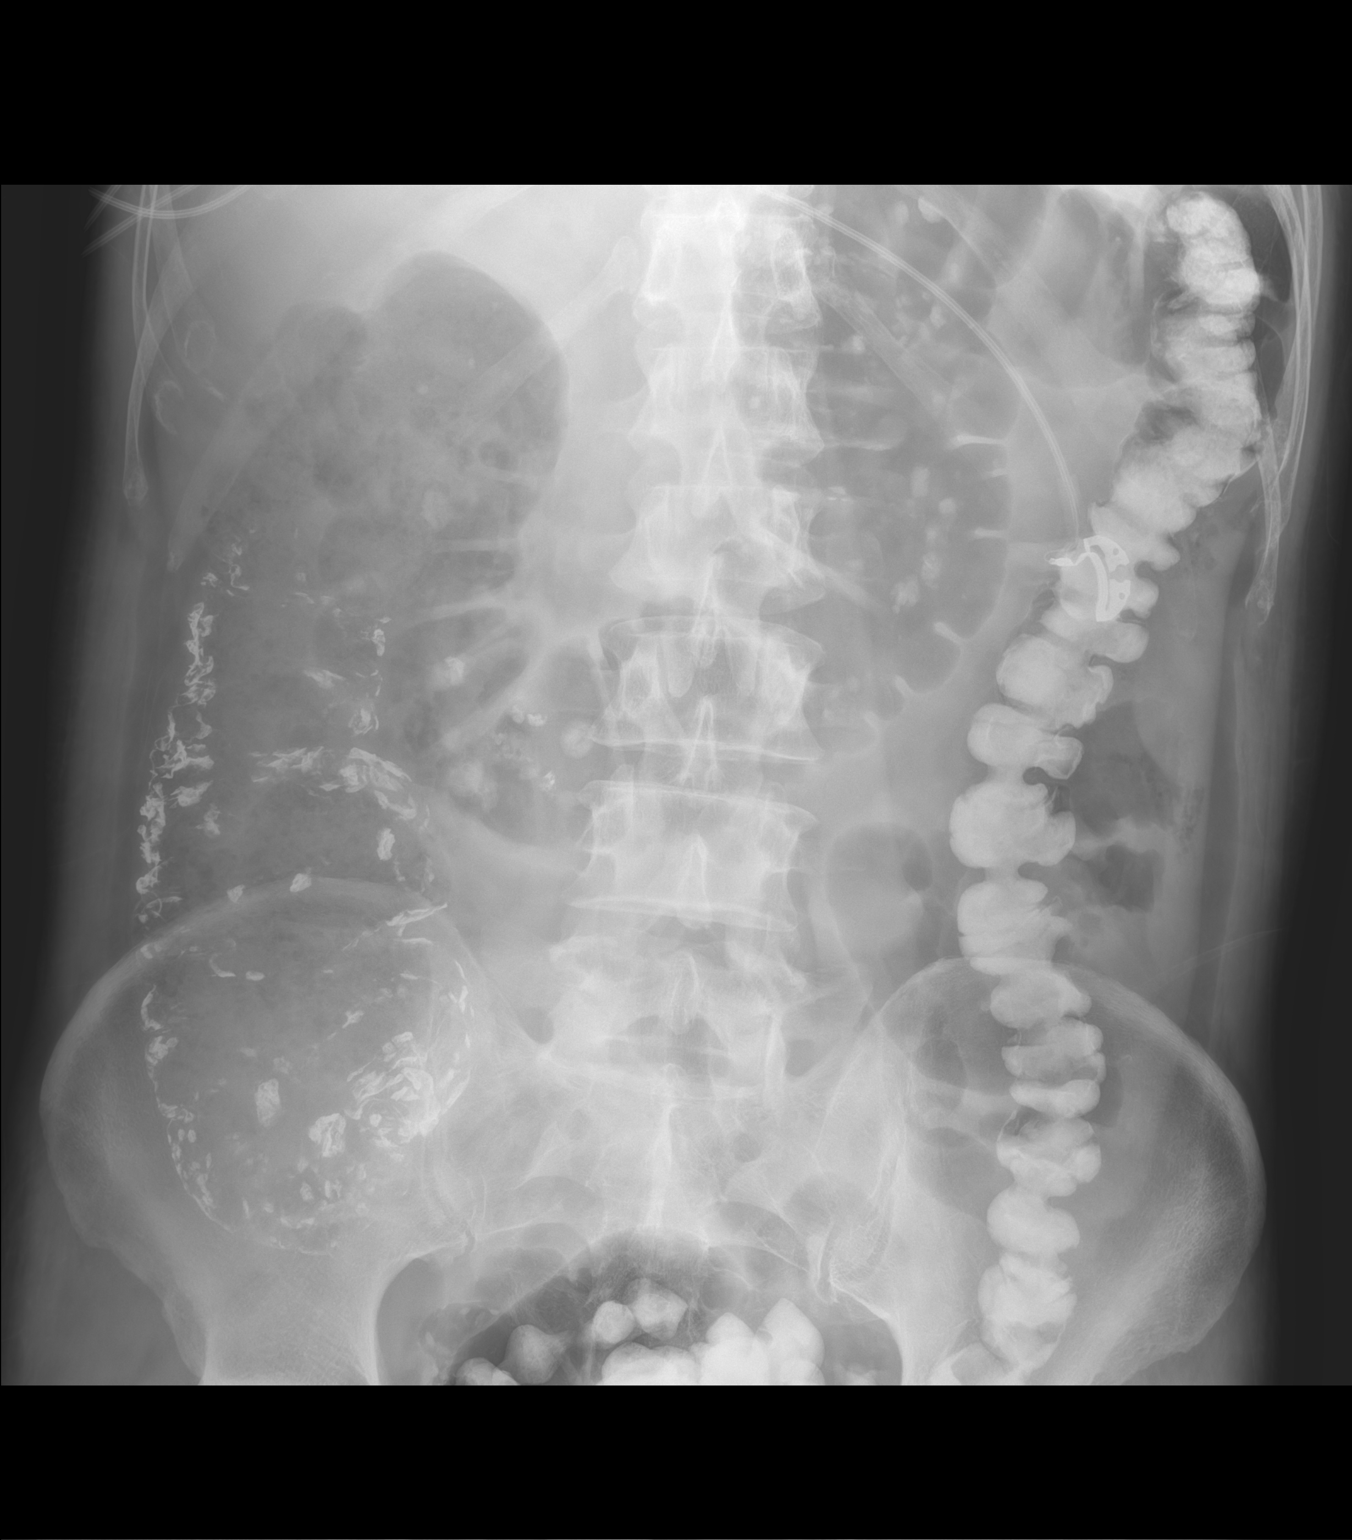

[abdomen kub (2 of 2)]
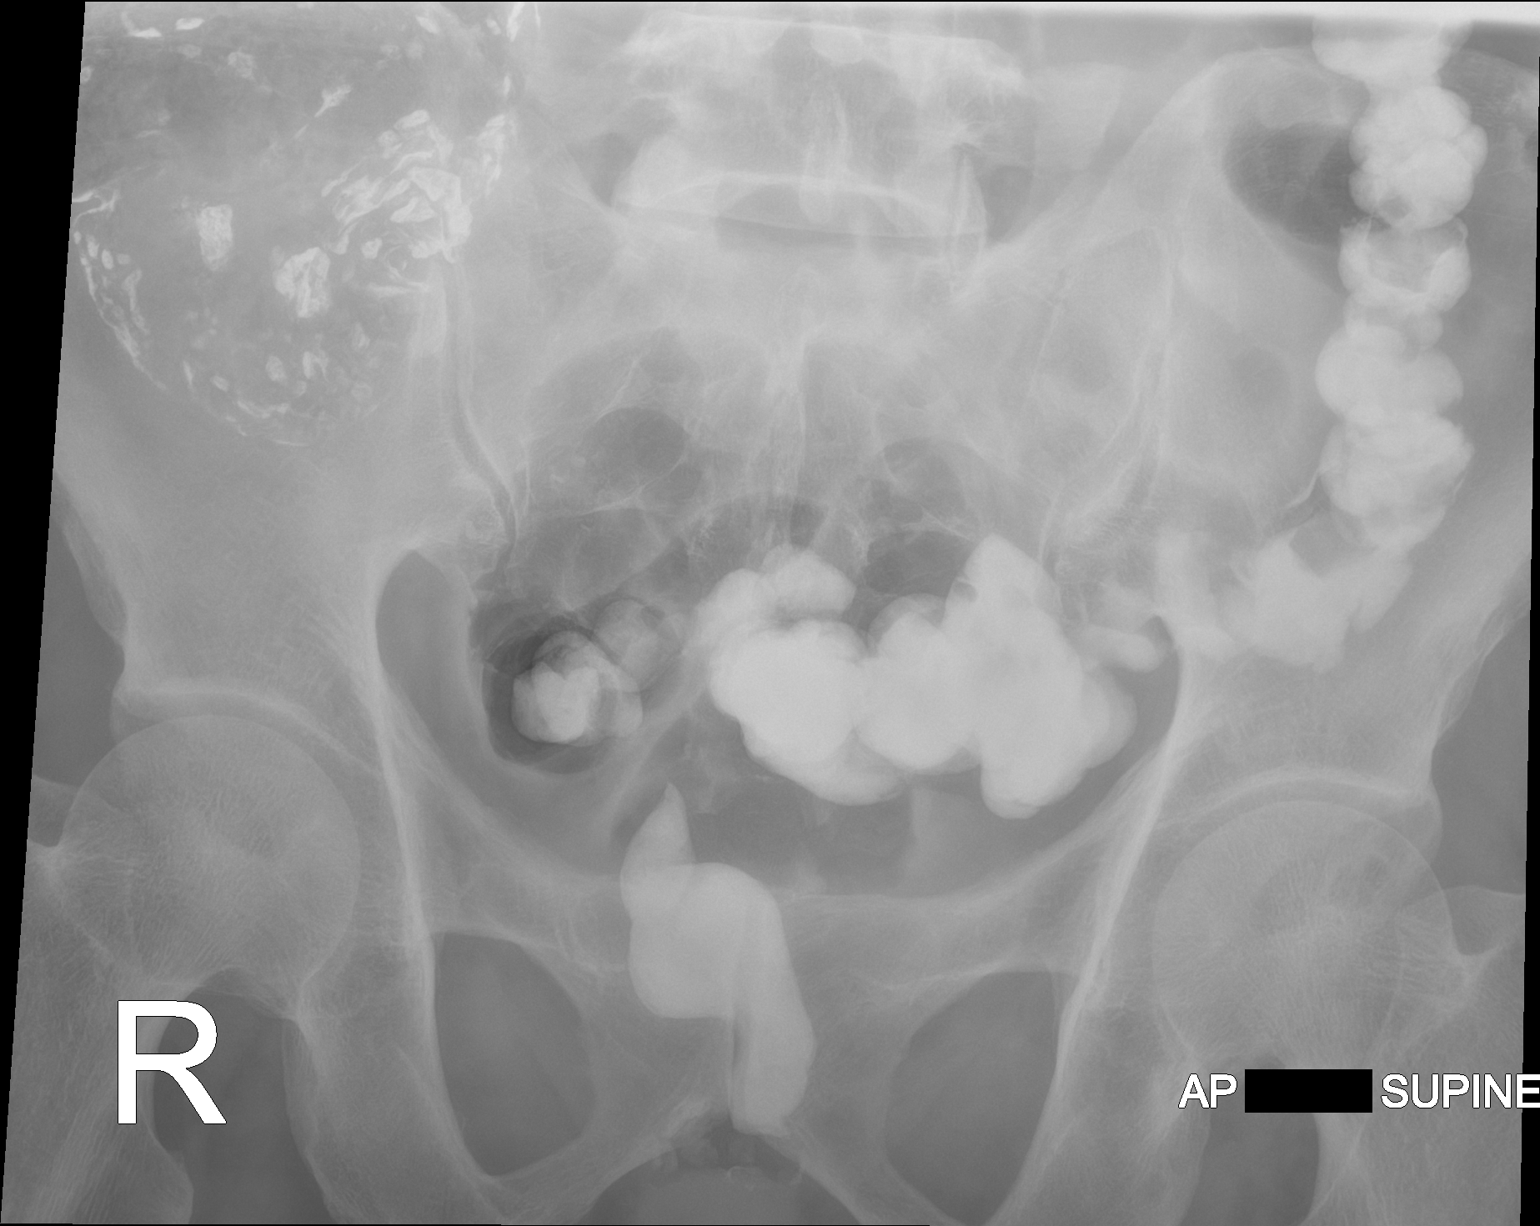

[2 of 2 positions shown; findings below may reference images not displayed]

FINDINGS: Nasogastric tube is been removed. Contrast administered during a
fluoroscopic examination of Bandy/[DATE] is now seen throughout the
colon and rectum which is nondilated. Normal abdominal gas pattern.
No gross free intraperitoneal gas. Osseous structures are
unremarkable. Previously noted renal calculi are obscured by
intraluminal contrast.
IMPRESSION: Normal abdominal gas pattern.
